# Patient Record
Sex: Female | Born: 1975 | Hispanic: No | Smoking: Never smoker
Health system: Southern US, Community
[De-identification: ages and names within clinical notes are randomized; demographics above are authoritative.]

## PROBLEM LIST (undated history)

## (undated) DIAGNOSIS — F209 Schizophrenia, unspecified: Secondary | ICD-10-CM

## (undated) DIAGNOSIS — Z923 Personal history of irradiation: Secondary | ICD-10-CM

## (undated) DIAGNOSIS — L910 Hypertrophic scar: Secondary | ICD-10-CM

## (undated) DIAGNOSIS — F22 Delusional disorders: Secondary | ICD-10-CM

## (undated) HISTORY — DX: Personal history of irradiation: Z92.3

## (undated) HISTORY — PX: TUBAL LIGATION: SHX77

---
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
## 2000-02-22 ENCOUNTER — Emergency Department (HOSPITAL_COMMUNITY): Admission: EM | Admit: 2000-02-22 | Discharge: 2000-02-22 | Payer: Self-pay | Admitting: Emergency Medicine

## 2000-07-21 ENCOUNTER — Inpatient Hospital Stay (HOSPITAL_COMMUNITY): Admission: AD | Admit: 2000-07-21 | Discharge: 2000-07-21 | Payer: Self-pay | Admitting: Gynecology

## 2000-09-08 ENCOUNTER — Inpatient Hospital Stay (HOSPITAL_COMMUNITY): Admission: AD | Admit: 2000-09-08 | Discharge: 2000-09-11 | Payer: Self-pay | Admitting: *Deleted

## 2005-02-07 ENCOUNTER — Inpatient Hospital Stay (HOSPITAL_COMMUNITY): Admission: AD | Admit: 2005-02-07 | Discharge: 2005-02-07 | Payer: Self-pay | Admitting: Obstetrics and Gynecology

## 2005-02-08 ENCOUNTER — Inpatient Hospital Stay (HOSPITAL_COMMUNITY): Admission: AD | Admit: 2005-02-08 | Discharge: 2005-02-09 | Payer: Self-pay | Admitting: Obstetrics and Gynecology

## 2005-03-04 ENCOUNTER — Other Ambulatory Visit: Admission: RE | Admit: 2005-03-04 | Discharge: 2005-03-04 | Payer: Self-pay | Admitting: Obstetrics and Gynecology

## 2005-04-15 ENCOUNTER — Inpatient Hospital Stay (HOSPITAL_COMMUNITY): Admission: AD | Admit: 2005-04-15 | Discharge: 2005-04-15 | Payer: Self-pay | Admitting: Obstetrics and Gynecology

## 2005-06-23 ENCOUNTER — Inpatient Hospital Stay (HOSPITAL_COMMUNITY): Admission: AD | Admit: 2005-06-23 | Discharge: 2005-06-23 | Payer: Self-pay | Admitting: Obstetrics and Gynecology

## 2005-06-25 ENCOUNTER — Inpatient Hospital Stay (HOSPITAL_COMMUNITY): Admission: AD | Admit: 2005-06-25 | Discharge: 2005-06-25 | Payer: Self-pay | Admitting: Obstetrics and Gynecology

## 2005-06-26 ENCOUNTER — Inpatient Hospital Stay (HOSPITAL_COMMUNITY): Admission: AD | Admit: 2005-06-26 | Discharge: 2005-06-26 | Payer: Self-pay | Admitting: Obstetrics and Gynecology

## 2005-06-29 ENCOUNTER — Inpatient Hospital Stay (HOSPITAL_COMMUNITY): Admission: AD | Admit: 2005-06-29 | Discharge: 2005-06-29 | Payer: Self-pay | Admitting: Obstetrics & Gynecology

## 2005-07-12 ENCOUNTER — Ambulatory Visit: Payer: Self-pay | Admitting: *Deleted

## 2005-07-19 ENCOUNTER — Ambulatory Visit: Payer: Self-pay | Admitting: *Deleted

## 2005-07-19 ENCOUNTER — Inpatient Hospital Stay (HOSPITAL_COMMUNITY): Admission: AD | Admit: 2005-07-19 | Discharge: 2005-07-19 | Payer: Self-pay | Admitting: Obstetrics and Gynecology

## 2005-07-20 ENCOUNTER — Inpatient Hospital Stay (HOSPITAL_COMMUNITY): Admission: AD | Admit: 2005-07-20 | Discharge: 2005-07-20 | Payer: Self-pay | Admitting: Obstetrics and Gynecology

## 2005-07-25 ENCOUNTER — Encounter (INDEPENDENT_AMBULATORY_CARE_PROVIDER_SITE_OTHER): Payer: Self-pay | Admitting: *Deleted

## 2005-07-25 ENCOUNTER — Inpatient Hospital Stay (HOSPITAL_COMMUNITY): Admission: AD | Admit: 2005-07-25 | Discharge: 2005-07-28 | Payer: Self-pay | Admitting: Obstetrics & Gynecology

## 2007-09-27 ENCOUNTER — Encounter (INDEPENDENT_AMBULATORY_CARE_PROVIDER_SITE_OTHER): Payer: Self-pay | Admitting: Internal Medicine

## 2007-09-27 ENCOUNTER — Ambulatory Visit: Payer: Self-pay | Admitting: Family Medicine

## 2007-09-27 DIAGNOSIS — R030 Elevated blood-pressure reading, without diagnosis of hypertension: Secondary | ICD-10-CM | POA: Insufficient documentation

## 2007-09-27 DIAGNOSIS — Z8742 Personal history of other diseases of the female genital tract: Secondary | ICD-10-CM | POA: Insufficient documentation

## 2007-09-27 DIAGNOSIS — O152 Eclampsia in the puerperium: Secondary | ICD-10-CM | POA: Insufficient documentation

## 2007-09-27 DIAGNOSIS — E669 Obesity, unspecified: Secondary | ICD-10-CM | POA: Insufficient documentation

## 2007-10-01 ENCOUNTER — Encounter (INDEPENDENT_AMBULATORY_CARE_PROVIDER_SITE_OTHER): Payer: Self-pay | Admitting: Internal Medicine

## 2010-07-18 ENCOUNTER — Emergency Department (HOSPITAL_COMMUNITY)
Admission: EM | Admit: 2010-07-18 | Discharge: 2010-07-20 | Disposition: A | Discharge status: Other Acute Inpatient Hospital | Payer: Self-pay | Source: Home / Self Care | Admitting: Emergency Medicine

## 2010-07-19 ENCOUNTER — Ambulatory Visit: Payer: Self-pay | Admitting: Psychiatry

## 2010-07-20 ENCOUNTER — Inpatient Hospital Stay (HOSPITAL_COMMUNITY): Admission: AD | Admit: 2010-07-20 | Discharge: 2010-07-29 | Payer: Self-pay | Admitting: Psychiatry

## 2010-11-09 LAB — DIFFERENTIAL
Basophils Absolute: 0 10*3/uL (ref 0.0–0.1)
Basophils Relative: 0 % (ref 0–1)
Eosinophils Absolute: 0 10*3/uL (ref 0.0–0.7)
Eosinophils Relative: 0 % (ref 0–5)
Lymphocytes Relative: 12 % (ref 12–46)
Lymphs Abs: 0.9 10*3/uL (ref 0.7–4.0)
Monocytes Absolute: 0.2 10*3/uL (ref 0.1–1.0)
Monocytes Relative: 2 % — ABNORMAL LOW (ref 3–12)
Neutro Abs: 6.5 10*3/uL (ref 1.7–7.7)
Neutrophils Relative %: 86 % — ABNORMAL HIGH (ref 43–77)

## 2010-11-09 LAB — CBC
HCT: 34.4 % — ABNORMAL LOW (ref 36.0–46.0)
Hemoglobin: 11.2 g/dL — ABNORMAL LOW (ref 12.0–15.0)
MCH: 26.3 pg (ref 26.0–34.0)
MCHC: 32.6 g/dL (ref 30.0–36.0)
MCV: 80.6 fL (ref 78.0–100.0)
Platelets: 424 10*3/uL — ABNORMAL HIGH (ref 150–400)
RBC: 4.28 MIL/uL (ref 3.87–5.11)
RDW: 16.8 % — ABNORMAL HIGH (ref 11.5–15.5)
WBC: 7.5 10*3/uL (ref 4.0–10.5)

## 2010-11-09 LAB — RAPID URINE DRUG SCREEN, HOSP PERFORMED
Amphetamines: NOT DETECTED
Barbiturates: NOT DETECTED
Benzodiazepines: NOT DETECTED
Cocaine: NOT DETECTED
Opiates: NOT DETECTED
Tetrahydrocannabinol: NOT DETECTED

## 2010-11-09 LAB — COMPREHENSIVE METABOLIC PANEL
ALT: 12 U/L (ref 0–35)
AST: 18 U/L (ref 0–37)
Albumin: 4.5 g/dL (ref 3.5–5.2)
Alkaline Phosphatase: 77 U/L (ref 39–117)
BUN: 13 mg/dL (ref 6–23)
CO2: 23 mEq/L (ref 19–32)
Calcium: 9.6 mg/dL (ref 8.4–10.5)
Chloride: 103 mEq/L (ref 96–112)
Creatinine, Ser: 1.25 mg/dL — ABNORMAL HIGH (ref 0.4–1.2)
GFR calc Af Amer: 59 mL/min — ABNORMAL LOW (ref >60)
GFR calc non Af Amer: 49 mL/min — ABNORMAL LOW (ref >60)
Glucose, Bld: 92 mg/dL (ref 70–99)
Potassium: 3.6 mEq/L (ref 3.5–5.1)
Sodium: 138 mEq/L (ref 135–145)
Total Bilirubin: 0.6 mg/dL (ref 0.3–1.2)
Total Protein: 9 g/dL — ABNORMAL HIGH (ref 6.0–8.3)

## 2010-11-09 LAB — POCT PREGNANCY, URINE: Preg Test, Ur: NEGATIVE

## 2010-11-09 LAB — ETHANOL: Alcohol, Ethyl (B): <5 mg/dL (ref 0–10)

## 2011-01-14 NOTE — H&P (Signed)
[facility]  Patient:    Valerie Howard, Valerie Howard                       MRN: [id number] Adm. Date:  [id number] Disc. Date: [id number] Attending:  Merrily Pew                         History and Physical  CHIEF COMPLAINT:              A 37-week intrauterine pregnancy with previous cesarean section.  HISTORY OF PRESENT ILLNESS:   The patient is a 35 year old G 2, P 1, at [redacted] weeks gestation, who had a pervious cesarean section in 1997, who was initially desiring a trial of labor, but has decided to undergo a repeat cesarean section.  The patient had several complications during her pregnancy, including increased risk for Downs syndrome, which an amniocentesis was performed and noted to be a 46XX karyotype.  The patient has had no other complaints of problems since then.  The patient reports good fetal movement. Denies any vaginal bleeding,  headache, nausea, vomiting, chills, abdominal pain, or change in bowel or bladder habits.  PAST MEDICAL HISTORY:         Significant for asthma.  PAST SURGICAL HISTORY:        1. Cesarean section in 1997.                               2. Cryo therapy in 1991.  PAST OBSTETRICAL HISTORY:     A cesarean section in 1997, for a breech presentation.  No complications during the pregnancy. PAST GYN HISTORY:             1. History of dysplasia in 1991, with cryo                                  surgery.                               2. History of HSV.  CURRENT MEDICATIONS:          Prenatal vitamins.  ALLERGIES:                    No known drug allergies.  SOCIAL HISTORY:               The patient is married.  Denies any tobacco, alcohol, or drugs.  FAMILY HISTORY:               Without any epithelial cancers.  PHYSICAL EXAMINATION:  VITAL SIGNS:                  Blood pressure 122/70.  HEENT:                        Clear.  NECK:                         Without any thyromegaly or JVD.  LUNGS:                         Clear to auscultation bilaterally.  HEART:  Regular rate and rhythm without any murmurs, rubs, or gallops.  ABDOMEN:                      Gravid, nontender.  Estimated fetal weight is 3300 g.  PELVIC:                       Cervix closed, thick, and  high.  ASSESSMENT/PLAN:              A 35 year old gravida 2, para 1, at 14 weeks,                               desiring repeat cesarean section, refusing                               vaginal birth after cesarean section.  The patient was counseled on the risks of the procedure, including bleeding, transfusion, infection, scar tissue, wound breakdown, damage to internal organs including bladder, ureter, pelvic organs, or bowel, fistula formation, potential hysterectomy for bleeding, anesthetic complications, blood clot, stroke, pulmonary embolus, and possibly death.  The patient states she understands these risks and desires to proceed. DD:  09/07/00 TD:  09/07/00 Job: [id number]

## 2011-01-14 NOTE — Op Note (Signed)
NAMESIDNIE, SWALLEY      ACCOUNT NO.:  [id number]   MEDICAL RECORD NO.:  [id number]          PATIENT TYPE:  INP   LOCATION:  9199                          FACILITY:  [facility]   PHYSICIAN:  Dineen Kid. Rana Snare, M.D.    DATE OF BIRTH:  January 29, 1976   DATE OF PROCEDURE:  07/25/2005  DATE OF DISCHARGE:                                 OPERATIVE REPORT   PREOPERATIVE DIAGNOSIS:  1.  Intrauterine pregnancy at 32-5/7 weeks.  2.  Twin pregnancy.  3.  Labor with spontaneous rupture.  4.  Multiparity and desires sterility.   POSTOPERATIVE DIAGNOSES:  1.  Intrauterine pregnancy at 32-5/7 weeks.  2.  Twin pregnancy.  3.  Labor with spontaneous rupture.  4.  Multiparity and desires sterility.  5.  Cystotomy.   PROCEDURES:  1.  Repeat low segment transverse cesarean section.  2.  Parkland bilateral tubal ligation.  3.  Repair of cystotomy.   SURGEON:  Dineen Kid. Rana Snare, M.D.   ANESTHESIA:  Spinal.   INDICATIONS:  Ms. Blake is a 35 year old G3, P2, who presented in  labor with spontaneous rupture of membranes.  Pregnancy has been complicated  by twins with concordant growth and preterm labor.  The patient stopped her  terbutaline two days ago on her own and woke up this morning in labor with  rupture of membranes.  Her EDC is September 13, 2005.  She has a previous  cesarean section x2, desires repeat.  She also desires tubal ligation.  Risks and benefits were discussed at length.  These included but are not  limited to risk of infection, bleeding, damage to uterus, tubes, and  ovaries, bowel, bladder, risk of tubal failure quoted at 5 out of 1000.  She  gives her informed consent and wished to proceed.   FINDINGS AT THE TIME OF SURGERY:  A viable female infant, Apgars 8 and 9,  weight 4 pounds 10 ounces.  A viable female infant, Apgars 8 and 10, weight  4 pounds 1 ounce.   DESCRIPTION OF PROCEDURE:  After adequate analgesia, the patient placed in  the supine position with left  lateral tilt.  She was sterilely prepped and  draped, the bladder was sterilely drained with a Foley catheter.  A  Pfannenstiel skin incision was made, was taken down sharply to the fascia,  which was incised transversely, extended superiorly and inferiorly off the  bellies of the rectus muscle.  Peritoneum, omentum and bladder were densely  adhered to the fascia and the uterus.  Attempts at careful dissection from  the fascial wall and from the uterine wall of the adhesions, removing  bladder from the midportion of the uterus and creating a small bladder flap,  a 3 cm cystotomy was performed in the process of dissecting the bladder from  the uterine wall and the fascia.  Good hemostasis was achieved.  At this  time the lower uterine segment was identified, myotomy incision was made.  The infant's vertex was delivered, the nares and pharynx suctioned, cord  clamped and cut, and handed to the pediatrician for resuscitation.  Cord  blood was obtained.  Amniotomy was performed on baby  B.  The baby's buttocks  was grasped, delivered in the frank breech position, the arms easily  reduced, head delivered, and the cord then clamped and cut, and handed to  the pediatrician for resuscitation.  Cord blood of baby B was obtained.  The  placentas were extracted manually.  An umbilical clamp was placed on the  cord of A.  The uterus was exteriorized, wiped clean with a dry lap.  The  myotomy incision was closed in two layers, the first being a running locking  layer, the second being an imbricating layer of 0 Monocryl suture.  The left  fallopian tube was identified at the fimbriated end, the midportion of the  tube grasped with a Babcock clamp and two sutures of 0 plain were placed at  the mesosalpinx, doubly ligated, the central portion excised, tubal ostia  made hemostatic with Bovie cautery, and sutures of 2-0 silk were placed on  the proximal and distal portion of the fallopian tube.  The left  fallopian  tube was identified by the fimbriated end, the midportion of the tube  grasped.  Two sutures of 0 Monocryl were placed at the mesosalpinx, doubly  ligated, central portion excised, tubal ostia made hemostatic with Bovie  cautery, and 2-0 silk was placed on the proximal and distal portion of the  fallopian tube with good hemostasis achieved.  The uterus placed back in the  peritoneal cavity.  After a copious amount of irrigation, adequate  hemostasis was assured.  Attention was turned toward the cystotomy.  The  mucosa was then closed with a running suture of 3-0 chromic.  It was  imbricated with a second layer of 3-0 chromic with good repair noted, good  hemostasis achieved.  The bladder was then instilled with sterile milk  approximately 400-500 mL.  No evidence of leaking noted.  The rectus muscles  were then plicated in the midline with 0 Monocryl suture.  The fascia was  closed with a running fascial stitch of #1 PDS.  Irrigation was applied and  after adequate hemostasis, the skin was stapled and Steri-Strips applied.  The patient tolerated the procedure well, was stable on transfer to the  recovery room.  The sponge and instrument count was normal x3.  Estimated  blood loss 700 mL.  The patient received 1 g of Rocephin after delivery of  the placenta.      Dineen Kid Rana Snare, M.D.  Electronically Signed     DCL/MEDQ  D:  07/25/2005  T:  07/25/2005  Job:  [id number]

## 2011-01-14 NOTE — Discharge Summary (Signed)
[facility]  Patient:    Valerie Howard, Valerie Howard             MRN: [id number] Adm. Date:  [id number] Disc. Date: [id number] Attending:  Wetzel Bjornstad Dictator:   Antony Contras, [facility]                           Discharge Summary  DISCHARGE DIAGNOSES:          1. Repeat low cervical transverse cesarean                                  section.                               2. History of herpes simplex virus.                               3. History of asthma.  PROCEDURES:                   Low cervical transverse cesarean section                               with delivery of viable infant.  HISTORY OF PRESENT ILLNESS:   The patient is a 35 year old, gravida 2, para 1-0-0-1 with an LMP of February 24, 2000, [facility] September 10, 2000. Prenatal risk factors include history of previous cesarean section secondary to breech. History of HSV. History of asthma. Past history of cervical dysplasia treated with cryosurgery. She also has a history of abnormal AFP showing an increased risk for Downs syndrome with a normal followup amniocentesis.  LABORATORY DATA:              Blood type O positive, antibody screen negative. Sickle cell negative. RPR, HBSAG, and HIV nonreactive. Rubella immune. GBS negative.  HOSPITAL COURSE AND TREATMENT:                The patient was admitted on September 08, 2000 for repeat low cervical transverse cesarean section after being counseled regarding VBAC and refusing the same. This procedure was performed by Dr. Penni Homans assisted by Dr. Farrel Gobble under spinal anesthesia. The patient was delivered of an Apgar 8/9 female infant weighing 6 pounds 12 ounces. Findings included omental adhesions to the bladder and also a 4 x 4-cm fundal fibroid.  Postoperatively, the patient remained afebrile. She has experienced some gas pains. CBC: Hematocrit 33.2, hemoglobin 11.2, WBCs 10.0, platelets 306,000. She was able to be discharged in satisfactory  condition with infant on her third postoperative day.  DISPOSITION:                  The patient is to follow up in six weeks. She is to continue prenatal vitamins and iron, Motrin and Percocet for pain. DD:  10/09/00 TD:  10/09/00 Job: [id number]

## 2011-01-14 NOTE — Op Note (Signed)
[facility]  Patient:    RUBEN, PYKA             MRN: [id number] Proc. Date: 09/08/00 Attending:  Tonye Royalty                           Operative Report  PREOPERATIVE DIAGNOSES:       1. A 39-week intrauterine pregnancy.                               2. Prior low transverse cesarean section.                               3. Refusal of vaginal birth after cesarean.  POSTOPERATIVE DIAGNOSES:      1. A 39-week intrauterine pregnancy.                               2. Prior low transverse cesarean section.                               3. Refusal of vaginal birth after cesarean.  PROCEDURE:                    Repeat low transverse cesarean section.  SURGEON:                      Katy Fitch, M.D.  ASSISTANT:                    Douglass Rivers, M.D.  ANESTHESIA:                   Spinal.  ESTIMATED BLOOD LOSS:         800 cc.  URINE OUTPUT:                 150 cc and clear.  FLUIDS:                       1500 cc crystalloid.  COMPLICATIONS:                None.  SPECIMENS:                    Cord blood.  FINDINGS:                     Living 6 pound 12 ounce female with Apgars of 8 and 9; with three-vessel cord and placenta intact.  Normal appearing tubes and ovaries, with a posterior fundal fibroid approximately 4 x 4 cm.  There is also omental adhesions to the bladder.  DESCRIPTION OF PROCEDURE:     After consent and procedure explained to the patient, the patient was taken to the operating room and given spinal anesthesia.  The patient was then placed in the dorsolithotomy position and prepped and draped in normal sterile fashion.  A Pfannenstiel skin incision was made through the prior scar and carried down to the underlying fascia. The fascia was incised with the scalpel, and then extended transversely using curved Mayo scissors.  The underlying fascia was grasped with Kocher clamps, and the underlying rectus  muscles are dissected off both  sharply and bluntly using curved Mayo scissors.  Entrance into the abdominal and peritoneal cavity was noted.  Extension of the peritoneal incision was made with curved Mayo scissors superiorly, and then with visualization of the bladder extended inferiorly.  However, there was noted to be omental adhesions to the superior edge of the bladder flap; and two Kelly clamps were crossclamped and a 3-0 plain suture was used to ligate the omental tissue.  Once the omentum was freed, the remainder of the bladder flap was taken down to the pubic bone. The bladder blade was then inserted into the pelvis, and bladder flap was created sharply using Metzenbaum scissors.  The bladder blade was inserted into the newly created bladder flap and a low transverse uterine incision was made with the scalpel.  The incision was extended transversely using bandage scissors.  The infants head was then delivered atraumatically.  The mouth and nose were suctioned.  The cord was clamped and cut and the infant handed off to the awaiting attendants.  Cord blood was then obtained and placenta was massaged from the uterus.  The uterus was then exteriorized and cleared of all clots and debris with moist tape.  A running 1-0 chromic interlocking stitch was then used to close the lower uterine segment.  The uterus was then returned to the pelvis and warm saline was used to irrigate the pelvis. Several areas of bleeding were noted on the bladder flap and uterine serosa were cauterized using a Bovie.  Irrigation was then again performed and hemostasis was noted.  The subfascial area was then inspected for hemostasis, which was noted.  The fascia was then reapproximated using 0 Vicryl in a running continuous stitch.  The subcutaneous tissue was then irrigated with warm normal saline.  Bovie was used to cauterize subcutaneous vessels.  The skin was then approximated using staples.  The patient was  taken to the recovery room in stable condition. DD:  09/08/00 TD:  09/09/00 Job: [id number]

## 2011-01-14 NOTE — Discharge Summary (Signed)
Valerie Howard, Valerie Howard      ACCOUNT NO.:  [id number]   MEDICAL RECORD NO.:  [id number]          PATIENT TYPE:  INP   LOCATION:  9304                          FACILITY:  [facility]   PHYSICIAN:  Michelle L. Grewal, M.D.DATE OF BIRTH:  1976/01/31   DATE OF ADMISSION:  07/25/2005  DATE OF DISCHARGE:  07/28/2005                                 DISCHARGE SUMMARY   PREOPERATIVE DIAGNOSES:  1.  Intrauterine pregnancy at 32-5/7 weeks' estimated gestational age.  2.  Twin gestation.  3.  Spontaneous onset of labor with preterm rupture of membranes.  4.  Multiparity, desires sterility.   DISCHARGE DIAGNOSES:  1.  Status post low transverse cesarean section.  2.  A viable female and female infant.  3.  Parkland bilateral tubal ligation.  4.  Repair cystotomy.   PROCEDURES:  1.  Repeat low transverse cesarean section.  2.  Parkland bilateral tubal ligation.  3.  Repair of cystotomy.   REASON FOR ADMISSION:  Please see dictated H&P.   HOSPITAL COURSE:  The patient was a 35 year old gravida 3, para 2, that  presented to [facility] in labor with spontaneous rupture  of membranes.  Pregnancy had been complicated by twins with concordant  growth and preterm labor.  On the day of admission the patient had stopped  her terbutaline two days prior to admission and had awoken on the morning of  admission and was in labor with spontaneous rupture of membranes.  The  patient had had a previous cesarean section x2 and desired a repeat.  Due to  multiparity, the patient has also requested permanent sterilization.  The  patient was then transferred to the operating room, where spinal anesthesia  was administered without difficulty.  A low transverse incision was made  with delivery of a viable female infant weighing 4 pounds 10 ounces, Apgars of  8 at one and 9 at five minutes and delivery of a viable female infant  weighing 4 pounds, 1.7 ounces, with Apgars of 8a t one minute and 10 at  five  minutes.  During the time of the cesarean delivery, careful dissection was  being made; however, there was a significant amount of adhesions and a  cystotomy was performed in the process of dissecting the bladder from the  uterine wall and the fascia.  Hemostasis was a was obtained and repair was  made. The patient tolerated the procedure well and was taken to the recovery  room in stable condition.  On postoperative day #1 the patient was without  complaint, babies were stable in the NICU, vital signs were stable with  temperature max of 100.5 and 98.7 at the time of rounding.  Abdomen slightly  distended; however, she had good return of bowel function.  Fundus was firm  and nontender.  Abdominal dressing was said to be clean, dry and intact.  Foley was draining with clear urine with adequate output.  Laboratory  findings revealed hemoglobin of 8.9, platelet count of 279,000, WBC count of  10.2,  On postoperative day #2 the patient was doing well.  The twins were  stable.  Vital signs stable without evidence of  infection.  Abdomen soft,  good return of bowel function.  Incision was clean, dry and intact.  Foley  was draining clear urine.  On postoperative day #3 the patient complained of  some pedal edema.  She denied headache, blurred vision or right upper  quadrant pain.  Vital signs stable with blood pressure 146/96 to 159/105.  Abdomen soft.  Pitting edema was noted to the knees 1+.  Deep tendon  reflexes 1+ without clonus.  PIH labs were ordered and hydrochlorothiazide  25 mg was started.  On postoperative day #3 the patient denied headache,  blurred vision or right upper quadrant pain.  She was ambulating well,  babies were stable in the NICU, vital signs were stable.  Blood pressure  140/90 to 157/99.  Deep tendon reflexes remained stable at 1+ without  clonus.  Abdomen was slightly distended.  Fundus firm and nontender.  Incision was clean, dry and intact.  Hemoglobin was  8.8, platelet count of  319,000, WBC count of 9.3.  Liver function tests were within normal limited.  Uric acid was 8.2.  The patient was given a suppository with good results  and later the patient did desire discharge.  Instructions were given and the  patient was discharged home.   CONDITION ON DISCHARGE:  Stable.   DIET:  Regular as tolerated.   To do no heavy lifting, no driving x2 weeks, no vaginal entry.   FOLLOW-UP:  Patient to follow up in the office in one week for catheter  removal and incision check.  She is to call for temperature greater than 100  degrees, persistent nausea or vomiting, heavy vaginal bleeding and/or  redness or drainage from the incisional site.   DISCHARGE MEDICATIONS:  1.  Percocet 5/325 mg x30 one p.o. every four to six hours p.r.n.  2.  Motrin 600 mg every six hours.  3.  Prenatal vitamins one p.o. daily.  4.  Colace one p.o. daily.      Julio Sicks, N.P.      Stann Mainland. Vincente Poli, M.D.  Electronically Signed    CC/MEDQ  D:  09/02/2005  T:  09/02/2005  Job:  [id number]

## 2011-12-05 ENCOUNTER — Emergency Department (HOSPITAL_COMMUNITY)
Admission: EM | Admit: 2011-12-05 | Discharge: 2011-12-06 | Disposition: A | Discharge status: Other Acute Inpatient Hospital | Payer: BC Managed Care – PPO | Attending: Emergency Medicine | Admitting: Emergency Medicine

## 2011-12-05 ENCOUNTER — Encounter (HOSPITAL_COMMUNITY): Payer: Self-pay | Admitting: Emergency Medicine

## 2011-12-05 DIAGNOSIS — F39 Unspecified mood [affective] disorder: Secondary | ICD-10-CM

## 2011-12-05 DIAGNOSIS — F22 Delusional disorders: Secondary | ICD-10-CM | POA: Insufficient documentation

## 2011-12-05 NOTE — ED Notes (Signed)
Pt is agitated but cooperative in words and actions
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
## 2011-12-06 ENCOUNTER — Inpatient Hospital Stay (HOSPITAL_COMMUNITY)
Admission: AD | Admit: 2011-12-06 | Discharge: 2011-12-20 | DRG: 430 | Disposition: A | Discharge status: Home / Self Care | Payer: BC Managed Care – PPO | Source: Ambulatory Visit | Attending: Psychiatry | Admitting: Psychiatry

## 2011-12-06 DIAGNOSIS — F200 Paranoid schizophrenia: Principal | ICD-10-CM | POA: Diagnosis present

## 2011-12-06 DIAGNOSIS — F22 Delusional disorders: Secondary | ICD-10-CM

## 2011-12-06 DIAGNOSIS — D649 Anemia, unspecified: Secondary | ICD-10-CM | POA: Diagnosis present

## 2011-12-06 DIAGNOSIS — F39 Unspecified mood [affective] disorder: Secondary | ICD-10-CM

## 2011-12-06 LAB — BASIC METABOLIC PANEL
BUN: 15 mg/dL (ref 6–23)
CO2: 26 mEq/L (ref 19–32)
Chloride: 103 mEq/L (ref 96–112)
GFR calc Af Amer: 84 mL/min — ABNORMAL LOW (ref >90)
GFR calc non Af Amer: 73 mL/min — ABNORMAL LOW (ref >90)
Glucose, Bld: 117 mg/dL — ABNORMAL HIGH (ref 70–99)
Potassium: 3.4 mEq/L — ABNORMAL LOW (ref 3.5–5.1)

## 2011-12-06 LAB — CBC
HCT: 30.4 % — ABNORMAL LOW (ref 36.0–46.0)
Hemoglobin: 8.9 g/dL — ABNORMAL LOW (ref 12.0–15.0)
MCH: 22 pg — ABNORMAL LOW (ref 26.0–34.0)
MCHC: 29.3 g/dL — ABNORMAL LOW (ref 30.0–36.0)
RBC: 4.05 MIL/uL (ref 3.87–5.11)
RDW: 17.4 % — ABNORMAL HIGH (ref 11.5–15.5)
WBC: 4.5 10*3/uL (ref 4.0–10.5)

## 2011-12-06 LAB — RAPID URINE DRUG SCREEN, HOSP PERFORMED
Amphetamines: NOT DETECTED
Benzodiazepines: NOT DETECTED
Opiates: NOT DETECTED
Tetrahydrocannabinol: NOT DETECTED

## 2011-12-06 LAB — URINALYSIS, ROUTINE W REFLEX MICROSCOPIC
Bilirubin Urine: NEGATIVE
Glucose, UA: NEGATIVE mg/dL
Leukocytes, UA: NEGATIVE
Protein, ur: NEGATIVE mg/dL
Specific Gravity, Urine: 1.021 (ref 1.005–1.030)
Urobilinogen, UA: 0.2 mg/dL (ref 0.0–1.0)
pH: 6 (ref 5.0–8.0)

## 2011-12-06 LAB — PREGNANCY, URINE: Preg Test, Ur: NEGATIVE

## 2011-12-06 LAB — URINE MICROSCOPIC-ADD ON

## 2011-12-06 LAB — ETHANOL: Alcohol, Ethyl (B): <11 mg/dL (ref 0–11)

## 2011-12-06 MED ORDER — ONDANSETRON HCL 4 MG PO TABS: 4.0000 mg | ORAL_TABLET | Freq: Three times a day (TID) | ORAL | Status: DC | PRN

## 2011-12-06 MED ORDER — IBUPROFEN 600 MG PO TABS
600.0000 mg | ORAL_TABLET | Freq: Three times a day (TID) | ORAL | Status: DC | PRN
Start: 2011-12-06 — End: 2011-12-20

## 2011-12-06 MED ORDER — NICOTINE 21 MG/24HR TD PT24
21.0000 mg | MEDICATED_PATCH | Freq: Every day | TRANSDERMAL | Status: DC
  Filled 2011-12-06 (×6): qty 1

## 2011-12-06 MED ORDER — HYDROXYZINE HCL 25 MG PO TABS: 25.0000 mg | ORAL_TABLET | Freq: Every evening | ORAL | Status: DC | PRN

## 2011-12-06 MED ORDER — NICOTINE 21 MG/24HR TD PT24: 21.0000 mg | MEDICATED_PATCH | Freq: Every day | TRANSDERMAL | Status: DC

## 2011-12-06 MED ORDER — ZOLPIDEM TARTRATE 5 MG PO TABS: 5.0000 mg | ORAL_TABLET | Freq: Every evening | ORAL | Status: DC | PRN

## 2011-12-06 MED ORDER — RISPERIDONE 1 MG PO TBDP
1.0000 mg | ORAL_TABLET | Freq: Every day | ORAL | Status: DC
  Filled 2011-12-06 (×3): qty 1

## 2011-12-06 MED ORDER — RISPERIDONE 1 MG PO TBDP: 1.0000 mg | ORAL_TABLET | Freq: Four times a day (QID) | ORAL | Status: DC | PRN

## 2011-12-06 MED ORDER — ALUM & MAG HYDROXIDE-SIMETH 200-200-20 MG/5ML PO SUSP: 30.0000 mL | ORAL | Status: DC | PRN

## 2011-12-06 MED ORDER — IBUPROFEN 600 MG PO TABS: 600.0000 mg | ORAL_TABLET | Freq: Three times a day (TID) | ORAL | Status: DC | PRN

## 2011-12-06 MED ORDER — MAGNESIUM HYDROXIDE 400 MG/5ML PO SUSP: 30.0000 mL | Freq: Every day | ORAL | Status: DC | PRN

## 2011-12-06 MED ORDER — LORAZEPAM 1 MG PO TABS: 1.0000 mg | ORAL_TABLET | Freq: Three times a day (TID) | ORAL | Status: DC | PRN

## 2011-12-06 MED ORDER — ACETAMINOPHEN 325 MG PO TABS: 650.0000 mg | ORAL_TABLET | Freq: Four times a day (QID) | ORAL | Status: DC | PRN

## 2011-12-06 NOTE — Progress Notes (Signed)
Pt has been in bed to this point. Writer approached pt to administer HS meds and attempt to ask Admission assessment questions. Pt continues to feel like she doesn't need to be here and doesn't feel like she needs any medications. Refused HS meds and refused to answer assessment questions. Kept replying "I dont need any medication" and "I dont need to be here!". Attempts by writer to encourage compliance with medications were ineffective. Safety has been maintained with Q15 minute observation. Will continue current POC and encourage compliance when possible.

## 2011-12-06 NOTE — ED Notes (Signed)
Pt searched and wanded by security. Pt belongings searched by security. Pt's 2 belongings bags at nurse's station.
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
## 2011-12-06 NOTE — ED Notes (Signed)
Pt informed of hospital policy regarding IVC pts. Pt verbalized understanding. Pt changed into blue paper scrubs. Pt belongings at nurse's station.
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
## 2011-12-06 NOTE — ED Notes (Signed)
Patient refused lunch tray.
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
## 2011-12-06 NOTE — ED Provider Notes (Signed)
Patient has been accepted to behavioral health.  Cyndra Numbers, MD 12/06/11 [phone]

## 2011-12-06 NOTE — BH Assessment (Signed)
Patient accepted  by Dr. Wallis Mart to Dr. Wallis Mart to [facility] for in-pt treatment.   Pt's bed assignment is  403-1. Writer called Dr. Alto Denver and made her aware of patients disposition for in-pt treatment at [facility]. She will discharge patient accordingly.

## 2011-12-06 NOTE — ED Provider Notes (Signed)
Medical screening examination/treatment/procedure(s) were performed by non-physician practitioner and as supervising physician I was immediately available for consultation/collaboration.  Olivia Mackie, MD 12/06/11 [phone]

## 2011-12-06 NOTE — Progress Notes (Signed)
Invited pt to behavioral health group in psych ED; pt declined.  Tedrick Port B MS, LPCA, NCC

## 2011-12-07 DIAGNOSIS — F200 Paranoid schizophrenia: Secondary | ICD-10-CM | POA: Diagnosis present

## 2011-12-07 MED ORDER — RISPERIDONE 2 MG PO TBDP
3.0000 mg | ORAL_TABLET | Freq: Every day | ORAL | Status: DC
  Administered 2011-12-08 – 2011-12-11 (×4): 3 mg via ORAL
  Filled 2011-12-07 (×7): qty 1

## 2011-12-07 MED ORDER — FERROUS SULFATE 325 (65 FE) MG PO TABS
325.0000 mg | ORAL_TABLET | Freq: Two times a day (BID) | ORAL | Status: DC
  Administered 2011-12-08 – 2011-12-15 (×9): 325 mg via ORAL
  Filled 2011-12-07 (×31): qty 1

## 2011-12-07 MED ORDER — POTASSIUM CHLORIDE CRYS ER 20 MEQ PO TBCR
20.0000 meq | EXTENDED_RELEASE_TABLET | Freq: Two times a day (BID) | ORAL | Status: AC
  Administered 2011-12-08 – 2011-12-09 (×4): 20 meq via ORAL
  Filled 2011-12-07 (×6): qty 1

## 2011-12-07 NOTE — Progress Notes (Signed)
Currently resting quietly in bed in right facing, prone position with eyes closed. Respirations are even and unlabored. No acute distress noted. Safety has been maintained with Q15 minute observation. Will continue current POC.
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
## 2011-12-07 NOTE — H&P (Signed)
Psychiatric Admission Assessment Adult  Patient Identification:  Valerie Howard  Date of Evaluation:  12/07/2011  Chief Complaint:  Mood Disorder NOS  History of Present Illness:: This is a 36 year old African-American female, admitted to [facility] from [facility] ED under IVC with complaints of paranoid ideations. Patient reports, "I am here because of family issues. I would not want you to ask me to elaborate what the issues are because I am not going to elaborate on any of them. I like to keep things the way they should be, within the family. There nothing any body can do to change that for me. I am not suppose to be here, but I am now, so let's go on with it. Yes, I slept well last night thank you, I was also sleeping well at home. Each and every one of you keep asking me questions like I don't know what is going on. I have a graduate degree, but I am not going to tell you what kind of degree it is. I went to [employer], but I am not too crazy about the school either"   Mood Symptoms:  'My mood is fine, thank you"  Depression Symptoms:  "I am not depressed"  (Hypo) Manic Symptoms:  Irritable Mood, per observation of patient attitude and mannerisms.  Anxiety Symptoms:  Patient appears suspicious and anxious when asked any questions per                                        observation  Psychotic Symptoms:  Paranoia, suspicious  PTSD Symptoms: Had a traumatic exposure:  Patient denies any hx. of and or recent traumatic experience.  Past Psychiatric History: Diagnosis: Paranoid disorder, NOS  Hospitalizations: [facility]  Outpatient Care: "I don not have any primary care physician"  Substance Abuse Care: Patient denies any recent and or remote substance abuse treatment.  Self-Mutilation: Denies report  Suicidal Attempts:Patient denies suicide attempts and thoughts.  Violent Behaviors: None reported    Past Medical History:  No past medical history on file. Cardiac History:   Hypertension.  Allergies:   Allergies  Allergen Reactions  . Penicillins    PTA Medications: No prescriptions prior to admission    Previous Psychotropic Medications:  Medication/Dose  "I do not take any medicines"               Substance Abuse History in the last 12 months: Substance Age of 1st Use Last Use Amount Specific Type  Nicotine "I do not smoke, drink and or use any kind of drugs"     Alcohol Denies use     Cannabis Denies use     Opiates Denies use     Cocaine Denies use     Methamphetamines Denies use     LSD Denies use     Ecstasy Denies use     Benzodiazepines Denies use     Caffeine      Inhalants      Others:                         Consequences of Substance Abuse: Medical Consequences:  Liver damage, possible death by overdose. Legal Consequences:  Arrests, jail time, loss of driving privilege Family Consequences:  Family discord  Social History: Current Place of Residence:  "I live in [place]"  Place of Birth: [place]  Family Members: "I have 4 children"  Marital Status:  Married  Children: 4  Sons: 1  Daughters:3  Relationships: "I have a family"  Education:  Financial planner Problems/Performance: "I completed graduate degree"  Religious Beliefs/Practices: None reported  History of Abuse (Emotional/Phsycial/Sexual): None reported  Occupational Experiences: Unemployed  Military History:  None.  Legal History: None reported  Hobbies/Interests: None reported  Family History:   Family History  Problem Relation Age of Onset  . Hypertension Other   . Diabetes Other   . Cancer Other     Mental Status Examination/Evaluation: Objective:  Appearance: Casual, Guarded and paranoid, suspicious  Eye Contact::  Good  Speech:  Pressured  Volume:  Increased  Mood:  Anxious and Irritable  Affect:  Blunt and Inappropriate  Thought Process:  Disorganized  Orientation:  Full  Thought Content:  Paranoid  Ideation  Suicidal Thoughts:  No  Homicidal Thoughts:  No  Memory:  Immediate;   Good Recent;   Good Remote;   Fair  Judgement:  Impaired  Insight:  Lacking  Psychomotor Activity:  Normal  Concentration:  Poor  Recall:  Good  Akathisia:  No  Handed:  Right  AIMS (if indicated):     Assets:  Others:  Patient is very disengaged and paranoid at this time.  Sleep:    "I sleep well, thank you"    Laboratory/X-Ray: None Psychological Evaluation(s)      Assessment:    AXIS I:  Schizophrenia, paranoid type AXIS II:  Deferred AXIS III:  No past medical history on file. AXIS IV:  other psychosocial or environmental problems and problems with access to health care services AXIS V:  21-30 behavior considerably influenced by delusions or hallucinations OR serious impairment in judgment, communication OR inability to function in almost all areas  Treatment Plan/Recommendations: Admit for safety and stabilization.                     Review and reinstate any pertinent home medications for other health conditions.                     Initiate Kdur 20 meq bid x 6 doses.                     Ferrous sulfate 325 mg bid.                     Obtain CMP 12/10/11, and CBC with diff on 12/14/11   Treatment Plan Summary: Daily contact with patient to assess and evaluate symptoms and progress in treatment Medication management   Current Medications:  Current Facility-Administered Medications  Medication Dose Route Frequency Provider Last Rate Last Dose  . acetaminophen (TYLENOL) tablet 650 mg  650 mg Oral Q6H PRN Viviann Spare, NP      . alum & mag hydroxide-simeth (MAALOX/MYLANTA) 200-200-20 MG/5ML suspension 30 mL  30 mL Oral Q4H PRN Viviann Spare, NP      . alum & mag hydroxide-simeth (MAALOX/MYLANTA) 200-200-20 MG/5ML suspension 30 mL  30 mL Oral PRN Viviann Spare, NP      . ferrous sulfate tablet 325 mg  325 mg Oral BID WC Sanjuana Kava, NP      . hydrOXYzine (ATARAX/VISTARIL)  tablet 25 mg  25 mg Oral QHS PRN Viviann Spare, NP      . ibuprofen (ADVIL,MOTRIN) tablet 600 mg  600 mg Oral Q8H PRN Viviann Spare,  NP      . LORazepam (ATIVAN) tablet 1 mg  1 mg Oral Q8H PRN Viviann Spare, NP      . magnesium hydroxide (MILK OF MAGNESIA) suspension 30 mL  30 mL Oral Daily PRN Viviann Spare, NP      . nicotine (NICODERM CQ - dosed in mg/24 hours) patch 21 mg  21 mg Transdermal Daily Viviann Spare, NP      . ondansetron (ZOFRAN) tablet 4 mg  4 mg Oral Q8H PRN Viviann Spare, NP      . potassium chloride SA (K-DUR,KLOR-CON) CR tablet 20 mEq  20 mEq Oral BID Sanjuana Kava, NP      . risperiDONE (RISPERDAL M-TABS) disintegrating tablet 1 mg  1 mg Oral Q6H PRN Viviann Spare, NP      . risperiDONE (RISPERDAL M-TABS) disintegrating tablet 1 mg  1 mg Oral QHS Viviann Spare, NP       Facility-Administered Medications Ordered in Other Encounters  Medication Dose Route Frequency Provider Last Rate Last Dose  . DISCONTD: alum & mag hydroxide-simeth (MAALOX/MYLANTA) 200-200-20 MG/5ML suspension 30 mL  30 mL Oral PRN Grant Fontana, PA      . DISCONTD: ibuprofen (ADVIL,MOTRIN) tablet 600 mg  600 mg Oral Q8H PRN Grant Fontana, [place]      . DISCONTD: LORazepam (ATIVAN) tablet 1 mg  1 mg Oral Q8H PRN Grant Fontana, PA      . DISCONTD: nicotine (NICODERM CQ - dosed in mg/24 hours) patch 21 mg  21 mg Transdermal Daily Grant Fontana, [place]      . DISCONTD: ondansetron (ZOFRAN) tablet 4 mg  4 mg Oral Q8H PRN Grant Fontana, PA      . DISCONTD: zolpidem (AMBIEN) tablet 5 mg  5 mg Oral QHS PRN Grant Fontana, PA        Observation Level/Precautions:  Q 15 minutes checks for safety  Laboratory:  CBC Chemistry Profile ordered for 12/10/11 and 12/14/11  Psychotherapy:  Group counseling and activities.   Medications:  See medication lists  Routine PRN Medications:  Yes  Consultations: None indicated at this time    Discharge Concerns:  Safety  Other:      Sanjuana Kava 4/10/201311:48 AM
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
## 2011-12-07 NOTE — Progress Notes (Signed)
Pt has been isolative to room for much of the day today, pt has chosen not to participate in any activities, pt appears paranoid at times and is still hesitant to share any information other than to say she does not belong here and disputes any facts as to why she is here, pt has also stated that she does not and will not take any medications, pt has made no threats against her person or staff and denies any feelings of suicidality, support provided, will continue to monitor

## 2011-12-08 LAB — CBC
MCH: 21.8 pg — ABNORMAL LOW (ref 26.0–34.0)
MCV: 76.3 fL — ABNORMAL LOW (ref 78.0–100.0)
Platelets: 448 10*3/uL — ABNORMAL HIGH (ref 150–400)
RBC: 4.64 MIL/uL (ref 3.87–5.11)

## 2011-12-08 LAB — DIFFERENTIAL
Eosinophils Absolute: 0.1 10*3/uL (ref 0.0–0.7)
Lymphs Abs: 4.1 10*3/uL — ABNORMAL HIGH (ref 0.7–4.0)
Monocytes Relative: 6 % (ref 3–12)
Neutrophils Relative %: 34 % — ABNORMAL LOW (ref 43–77)

## 2011-12-08 MED ORDER — HALOPERIDOL LACTATE 5 MG/ML IJ SOLN
5.0000 mg | Freq: Every evening | INTRAMUSCULAR | Status: DC | PRN
  Filled 2011-12-08 (×2): qty 1

## 2011-12-08 MED ORDER — BENZTROPINE MESYLATE 1 MG/ML IJ SOLN
1.0000 mg | Freq: Four times a day (QID) | INTRAMUSCULAR | Status: DC | PRN
  Filled 2011-12-08: qty 2

## 2011-12-08 MED ORDER — BENZTROPINE MESYLATE 1 MG/ML IJ SOLN: 1.0000 mg | Freq: Four times a day (QID) | INTRAMUSCULAR | Status: DC | PRN

## 2011-12-08 MED ORDER — PROPRANOLOL HCL 40 MG PO TABS
40.0000 mg | ORAL_TABLET | Freq: Two times a day (BID) | ORAL | Status: DC
  Administered 2011-12-12: 40 mg via ORAL
  Filled 2011-12-08 (×12): qty 1
  Filled 2011-12-08: qty 4

## 2011-12-08 MED ORDER — BENZTROPINE MESYLATE 1 MG PO TABS: 1.0000 mg | ORAL_TABLET | Freq: Four times a day (QID) | ORAL | Status: DC | PRN

## 2011-12-08 NOTE — Progress Notes (Signed)
[facility] MD Progress Note  12/08/2011 4:12 PM  Diagnosis:  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   The patient was seen today and reports the following:   ADL's: Intact.  Sleep: The patient reports to sleeping well last night.  Appetite: The patient reports a good appetite today.   Mild>(1-10) >Severe  Hopelessness (1-10): 0  Depression (1-10): 0  Anxiety (1-10): 0   Suicidal Ideation: The patient denies any suicidal ideations today.  Plan: No  Intent: No  Means: No   Homicidal Ideation: The patient denies any homicidal ideations today.  Plan: No  Intent: No.  Means: No   General Appearance/Behavior: The patient was mildly agitated and minimally cooperative.   Eye Contact: Good.  Speech: Increased in rate and volume today with no pressuring noted.  Motor Behavior: Mildly agitated.  Level of Consciousness: Alert and Oriented x 3.  Mental Status: Alert and Oriented x 3.  Mood: Mildly agitated.  Affect: Mild to moderately Angry.  Anxiety Level: No anxiety reported today.  Thought Process: Paranoid delusions continue.  Thought Content: The patient denies any auditory or visual hallucinations today. She would not comment on delusional thinking but continues to display paranoid delusions.  Perception:. Paranoid.  Judgment: Poor.  Insight: Poor.  Cognition: Oriented to person, place and time.  Sleep:  Number of Hours: 6.75    Vital Signs:Blood pressure 130/89, pulse 151, temperature 97 F (36.1 C), temperature source Oral, resp. rate 18, last menstrual period 12/02/2011, SpO2 98.00%.  Current Medications: Current Facility-Administered Medications  Medication Dose Route Frequency Provider Last Rate Last Dose  . alum & mag hydroxide-simeth (MAALOX/MYLANTA) 200-200-20 MG/5ML suspension 30 mL  30 mL Oral Q4H PRN Viviann Spare, NP      . ferrous sulfate tablet 325 mg  325 mg Oral BID WC Curlene Labrum Albertina Leise, MD   325 mg at 12/08/11 0831  . hydrOXYzine  (ATARAX/VISTARIL) tablet 25 mg  25 mg Oral QHS PRN Viviann Spare, NP      . ibuprofen (ADVIL,MOTRIN) tablet 600 mg  600 mg Oral Q8H PRN Viviann Spare, NP      . LORazepam (ATIVAN) tablet 1 mg  1 mg Oral Q8H PRN Viviann Spare, NP      . magnesium hydroxide (MILK OF MAGNESIA) suspension 30 mL  30 mL Oral Daily PRN Viviann Spare, NP      . nicotine (NICODERM CQ - dosed in mg/24 hours) patch 21 mg  21 mg Transdermal Daily Curlene Labrum Ragen Laver, MD      . ondansetron (ZOFRAN) tablet 4 mg  4 mg Oral Q8H PRN Viviann Spare, NP      . potassium chloride SA (K-DUR,KLOR-CON) CR tablet 20 mEq  20 mEq Oral BID Curlene Labrum Krisann Mckenna, MD   20 mEq at 12/08/11 0832  . risperiDONE (RISPERDAL M-TABS) disintegrating tablet 1 mg  1 mg Oral Q6H PRN Viviann Spare, NP      . risperiDONE (RISPERDAL M-TABS) disintegrating tablet 3 mg  3 mg Oral QHS Ronny Bacon, MD       Lab Results:  Results for orders placed during the hospital encounter of 12/06/11 (from the past 48 hour(s))  DIFFERENTIAL     Status: Abnormal   Collection Time   12/08/11  6:10 AM      Component Value Range Comment   Neutrophils Relative 34 (*) 43 - 77 (%)    Neutro Abs 2.4  1.7 - 7.7 (  K/uL)    Lymphocytes Relative 59 (*) 12 - 46 (%)    Lymphs Abs 4.1 (*) 0.7 - 4.0 (K/uL)    Monocytes Relative 6  3 - 12 (%)    Monocytes Absolute 0.4  0.1 - 1.0 (K/uL)    Eosinophils Relative 1  0 - 5 (%)    Eosinophils Absolute 0.1  0.0 - 0.7 (K/uL)    Basophils Relative 1  0 - 1 (%)    Basophils Absolute 0.1  0.0 - 0.1 (K/uL)   CBC     Status: Abnormal   Collection Time   12/08/11  6:10 AM      Component Value Range Comment   WBC 7.0  4.0 - 10.5 (K/uL)    RBC 4.64  3.87 - 5.11 (MIL/uL)    Hemoglobin 10.1 (*) 12.0 - 15.0 (g/dL)    HCT 16.1 (*) 09.6 - 46.0 (%)    MCV 76.3 (*) 78.0 - 100.0 (fL)    MCH 21.8 (*) 26.0 - 34.0 (pg)    MCHC 28.5 (*) 30.0 - 36.0 (g/dL)    RDW 04.5 (*) 40.9 - 15.5 (%)    Platelets 448 (*) 150 - 400 (K/uL)    Time was  spent today discussing with the patient her current symptoms.  She reports to sleeping well and reports a good appetite.  The patient denies any SI/HI.  She does continue to display much paranoid thinking and again refuses to discuss the specifics of this.  She reports that she has discussed this with staff in 2011 and will not do so again.  The patient is referring to her believe that others are monitoring her and entering her house at night and moving objects.  She also refers to the belief that people are sending her children text messages instructing them to do things against her will.   Considering the severity of the patient's paranoid delusions and considering the potential danger of the patient's behaviors, it is my opinion that forced medication is indicated.   Treatment Plan Summary:  1. Daily contact with patient to assess and evaluate symptoms and progress in treatment  2. Medication management  3. The patient will deny suicidal ideations or homicidal ideations for 48 hours prior to discharge and have a depression and anxiety rating of 3 or less. The patient will also deny any auditory or visual hallucinations or delusional thinking.  4. The patient will deny any symptoms of substance withdrawal at time of discharge.   Plan:  1. Will continue the patient on her currently prescribed medications.  2. Laboratory studies reviewed.  3. Will continue to monitor.  4. Will continue the patient on her IVC till further stabilized.  5. Have request a 2nd opinion from Dr. Koren Shiver for a forced medication order who will see the patient today.   Cristiano Capri 12/08/2011, 4:12 PM

## 2011-12-08 NOTE — Progress Notes (Signed)
Patient refused to complete patient self inventory sheet. Has been sleeping, had not been taking meds but took her FE and K+ pill this morning for this nurse, no other meds were scheduled at that time, had to check out the writing on each pill to make sure it was the correct med for her. Does not attend group. Stays in her room much of the time.denies Si or Hi. q32min safety checks continue and support offered Safety maintained

## 2011-12-08 NOTE — Progress Notes (Signed)
[facility] Group Notes:  (Counselor/Nursing/MHT/Case Management/Adjunct)  12/08/2011 3:56 PM  Type of Therapy:  Group Therapy  Participation Level:  Did Not Attend   Valerie Howard 12/08/2011, 3:56 PM

## 2011-12-08 NOTE — Progress Notes (Signed)
EKG-- was done. After much explaining--pt. Still refused the inderal. After pt. Tried to justify why  she did not need the risperdal  , pt. Got very angry & argumentative , was given med. Ed. & was still refusing. Pt. Finally with much reluctance took the risperdal but is not happy about it. Pt,s speech is pressured & she is paranoid & suspicious about taking meds.[name]

## 2011-12-08 NOTE — Progress Notes (Signed)
[facility] Group Notes:  (Counselor/Nursing/MHT/Case Management/Adjunct)  12/08/2011 3:57 PM  Type of Therapy:  Music Therapy  Participation Level:  Did Not Attend   Valerie Howard 12/08/2011, 3:57 PM

## 2011-12-08 NOTE — Tx Team (Signed)
Interdisciplinary Treatment Plan Update (Adult)  Date:  12/08/2011  Time Reviewed:  10:15AM-11:00AM  Progress in Treatment: Attending groups:  No Participating in groups: No    Taking medication as prescribed:    No, refuses to take medications -- a 2nd opinion is being sought today Tolerating medication:   N/A Family/Significant other contact made:  Yes, with husband Patient understands diagnosis:   No, in denial Discussing patient identified problems/goals with staff:   No, refuses to talk or answer questions Medical problems stabilized or resolved:   N/A Denies suicidal/homicidal ideation:  Adamantly denies Issues/concerns per patient self-inventory:   None Other:    New problem(s) identified: No, Describe:    Reason for Continuation of Hospitalization: Aggression Other; describe Family concern, paranoia, denial of illness  Interventions implemented related to continuation of hospitalization:  Medication monitoring and adjustment, safety checks Q15 min., suicide risk assessment, group therapy, psychoeducation, collateral contact, aftercare planning, ongoing physician assessments, medication education  Additional comments:  Not applicable  Estimated length of stay:  4-6 days  Discharge Plan:  Unknown at this time  New goal(s):  Not applicable  Review of initial/current patient goals per problem list:   1.  Goal(s):  Deny SI / HI for 48 hours prior to D/C.  Met:  No  Target date:  By Discharge   As evidenced by:  Denies today, needs 48 hours  2.  Goal(s):  Reduce psychosis, i.e. Paranoia, to baseline.  Met:  No  Target date:  By Discharge   As evidenced by:  Paranoid, will not discuss anything with staff  3.  Goal(s):  Decide on aftercare provider and living situation.  Met:  No  Target date:  By Discharge   As evidenced by:  Does not want treatment, husband states plans to leave her  4.  Goal(s):  Medication stabilization  Met:  No  Target date:  By  Discharge   As evidenced by:  Refuses to take any medication -- 2nd opinion for forced meds being done.  Attendees: Patient:  Valerie Howard  12/08/2011 10:15AM-11:00AM  Family:     Physician:  Dr. Harvie Heck Readling 12/08/2011 10:15AM-11:00AM  Nursing:   Case Manager:  Ambrose Mantle, LCSW 12/08/2011 10:15AM-11:00AM  Counselor:  [name], MT-BC 12/08/2011 10:15AM-11:00AM  Other:   Lynann Bologna, NP 12/08/2011 10:15AM-11:00AM  Other:      Other:      Other:       Scribe for Treatment Team:   Sarina Ser, 12/08/2011, 10:15AM-11:15AM

## 2011-12-08 NOTE — Progress Notes (Signed)
[facility] Group Notes:  (Counselor/Nursing/MHT/Case Management/Adjunct)  12/08/2011 9:06 AM  Type of Therapy:  Psychoeducational Skills and Group Therapy 12/07/11  Participation Level:  Did Not Attend   [name] 12/08/2011, 9:06 AM
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
## 2011-12-09 NOTE — Progress Notes (Signed)
Currently resting quietly in bed in left facing prone position. Respirations are even and unlabored. No acute distress noted. No subjective signs of pain or discomfort. Safety has been maintained with Q15 minute observation. Will continue current POC.
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
## 2011-12-09 NOTE — Progress Notes (Signed)
Patient ID: Valerie Howard, female   DOB: 20-Sep-1975, 36 y.o.   MRN: [id number] The patient isolated in her room all evening. Did not go to group. Initially refused her HS medications stating she was lied to by the doctor because he said she did not have to take them. Her mood and affect was angry. She did decide to take her medication after much encouragement. Was caught trying to spit the medication out. Offered but refused a snack.

## 2011-12-09 NOTE — Progress Notes (Signed)
[facility] Group Notes:  (Counselor/Nursing/MHT/Case Management/Adjunct)  12/09/2011 11:34 AM  Type of Therapy:  Group Therapy  Participation Level:  Did Not Attend  [name] 12/09/2011, 11:34 AM
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
## 2011-12-09 NOTE — Progress Notes (Signed)
[facility] MD Progress Note  12/09/2011 3:45 PM  Diagnosis:  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   The patient was seen today and reports the following:   ADL's: Intact.  Sleep: The patient reports to sleeping well last night but according to staff slept approximately 4 hours.  Appetite: The patient reports a good appetite today.   Mild>(1-10) >Severe  Hopelessness (1-10): 0  Depression (1-10): 0  Anxiety (1-10): 0   Suicidal Ideation: The patient denies any suicidal ideations today.  Plan: No  Intent: No  Means: No   Homicidal Ideation: The patient denies any homicidal ideations today.  Plan: No  Intent: No.  Means: No   General Appearance/Behavior: The patient was more cooperative today but continues to state that she does not need medications.  She did reluctantly take the medication Risperdal last night but is refusing her Inderal despite having a HR of over 140.  Eye Contact: Good.  Speech: Appropriate in rate and volume with no pressuring noted.  Motor Behavior: Very slightly agitated.  Level of Consciousness: Alert and Oriented x 3.  Mental Status: Alert and Oriented x 3.  Mood: Mildly agitated.  Affect: Slightly constricted.  Anxiety Level: No anxiety reported today.  Thought Process: Paranoid delusions continue.  Thought Content: The patient denies any auditory or visual hallucinations today. She continues to report paranoid delusions related to her family but her affective response to these are lessened today. Perception:. Paranoid.  Judgment: Poor.  Insight: Poor.  Cognition: Oriented to person, place and time.  Sleep:  Number of Hours: 4    Vital Signs:Blood pressure 137/86, pulse 148, temperature 98.6 F (37 C), temperature source Oral, resp. rate 16, last menstrual period 12/02/2011, SpO2 100.00%.  Current Medications: Current Facility-Administered Medications  Medication Dose Route Frequency Provider Last Rate Last Dose  .  alum & mag hydroxide-simeth (MAALOX/MYLANTA) 200-200-20 MG/5ML suspension 30 mL  30 mL Oral Q4H PRN Viviann Spare, NP      . benztropine (COGENTIN) tablet 1 mg  1 mg Oral Q6H PRN Ronny Bacon, MD       Or  . benztropine mesylate (COGENTIN) injection 1 mg  1 mg Intramuscular Q6H PRN Curlene Labrum Ladasia Sircy, MD      . ferrous sulfate tablet 325 mg  325 mg Oral BID WC Curlene Labrum Smt. Loder, MD   325 mg at 12/09/11 7829  . haloperidol lactate (HALDOL) injection 5 mg  5 mg Intramuscular QHS PRN Ronny Bacon, MD      . hydrOXYzine (ATARAX/VISTARIL) tablet 25 mg  25 mg Oral QHS PRN Viviann Spare, NP      . ibuprofen (ADVIL,MOTRIN) tablet 600 mg  600 mg Oral Q8H PRN Viviann Spare, NP      . LORazepam (ATIVAN) tablet 1 mg  1 mg Oral Q8H PRN Viviann Spare, NP      . magnesium hydroxide (MILK OF MAGNESIA) suspension 30 mL  30 mL Oral Daily PRN Viviann Spare, NP      . nicotine (NICODERM CQ - dosed in mg/24 hours) patch 21 mg  21 mg Transdermal Daily Curlene Labrum Sandrine Bloodsworth, MD      . ondansetron (ZOFRAN) tablet 4 mg  4 mg Oral Q8H PRN Viviann Spare, NP      . potassium chloride SA (K-DUR,KLOR-CON) CR tablet 20 mEq  20 mEq Oral BID Curlene Labrum Jasaun Carn, MD   20 mEq at 12/09/11 5621  . propranolol (INDERAL)  tablet 40 mg  40 mg Oral BID Curlene Labrum Ambreen Tufte, MD      . risperiDONE (RISPERDAL M-TABS) disintegrating tablet 3 mg  3 mg Oral QHS Curlene Labrum Jimya Ciani, MD   3 mg at 12/08/11 2242  . DISCONTD: benztropine (COGENTIN) tablet 1 mg  1 mg Oral Q6H PRN Ronny Bacon, MD      . DISCONTD: benztropine mesylate (COGENTIN) injection 1 mg  1 mg Intramuscular Q6H PRN Ronny Bacon, MD      . DISCONTD: risperiDONE (RISPERDAL M-TABS) disintegrating tablet 1 mg  1 mg Oral Q6H PRN Viviann Spare, NP       Lab Results:  Results for orders placed during the hospital encounter of 12/06/11 (from the past 48 hour(s))  DIFFERENTIAL     Status: Abnormal   Collection Time   12/08/11  6:10 AM      Component Value Range  Comment   Neutrophils Relative 34 (*) 43 - 77 (%)    Neutro Abs 2.4  1.7 - 7.7 (K/uL)    Lymphocytes Relative 59 (*) 12 - 46 (%)    Lymphs Abs 4.1 (*) 0.7 - 4.0 (K/uL)    Monocytes Relative 6  3 - 12 (%)    Monocytes Absolute 0.4  0.1 - 1.0 (K/uL)    Eosinophils Relative 1  0 - 5 (%)    Eosinophils Absolute 0.1  0.0 - 0.7 (K/uL)    Basophils Relative 1  0 - 1 (%)    Basophils Absolute 0.1  0.0 - 0.1 (K/uL)   CBC     Status: Abnormal   Collection Time   12/08/11  6:10 AM      Component Value Range Comment   WBC 7.0  4.0 - 10.5 (K/uL)    RBC 4.64  3.87 - 5.11 (MIL/uL)    Hemoglobin 10.1 (*) 12.0 - 15.0 (g/dL)    HCT 16.1 (*) 09.6 - 46.0 (%)    MCV 76.3 (*) 78.0 - 100.0 (fL)    MCH 21.8 (*) 26.0 - 34.0 (pg)    MCHC 28.5 (*) 30.0 - 36.0 (g/dL)    RDW 04.5 (*) 40.9 - 15.5 (%)    Platelets 448 (*) 150 - 400 (K/uL)    Time was spent today discussing with the patient her current symptoms. She reports to sleeping well and reports a good appetite. The patient denies any SI/HI.  She denies any auditory or visual hallucinations.  She continues to display paranoid delusions today.  Much time was spent today discussing with the patient the need for her to take her medications.  The patient reluctantly took the medication Risperdal last night with much encouragement.  She however is refusing the medication Inderal despite having tachycardia.  Considering the severity of the patient's paranoid delusions and considering the potential danger of the patient's behaviors, it is my opinion that forced medications remain necessary.   Treatment Plan Summary:  1. Daily contact with patient to assess and evaluate symptoms and progress in treatment  2. Medication management  3. The patient will deny suicidal ideations or homicidal ideations for 48 hours prior to discharge and have a depression and anxiety rating of 3 or less. The patient will also deny any auditory or visual hallucinations or delusional  thinking.  4. The patient will deny any symptoms of substance withdrawal at time of discharge.   Plan:  1. Will continue the patient on her currently prescribed medications.  2. Laboratory studies reviewed.  3.  Will continue to monitor.  4. Will continue the patient on her IVC till further stabilized.  5. A 2nd opinion from Dr. Koren Shiver for a forced medication was completed yesterday.  6. Forced Medication order for Risperdal ordered yesterday. 7. A repeat CBC with Diff and CMP was ordered for Sunday evening to re-evaluate the patient's anemia and hypokalemia.  Labrian Torregrossa 12/09/2011, 3:45 PM

## 2011-12-09 NOTE — Progress Notes (Signed)
Pt has been in milieu and has attended some milieu activities, pt has been more pleasant upon approach today, more receptive to answer some questions, pt did receive some medications today but has refused both doses of propranolol. Pt was provided with education in regards to medication but declined just the same

## 2011-12-10 NOTE — Progress Notes (Signed)
Pt is irritable and agitated  She is requesting to be discharged that she does not need to be here  She is defensive when talking to staff and she paces the hall  She refused some of her medications this morning   Verbal support given  Medications administered and effectiveness monitored Q 15 min checks Pt safe at present

## 2011-12-10 NOTE — Progress Notes (Signed)
Patient ID: Valerie Howard, female   DOB: 1976-06-01, 36 y.o.   MRN: [id number]   [facility] Group Notes:  (Counselor/Nursing/MHT/Case Management/Adjunct)  12/10/2011 11 AM  Type of Therapy:  Group Therapy, Dance/Movement Therapy    Thomasena Edis, Raed Schalk

## 2011-12-10 NOTE — Progress Notes (Signed)
Patient ID: Valerie Howard, female   DOB: 1976-03-02, 36 y.o.   MRN: [id number] [facility] MD Progress Note  12/10/2011 9:15 PM  Diagnosis:  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   The patient was seen today and reports the following:   ADL's: Intact.  Sleep: The patient reports to sleeping well last night.  Appetite: The patient reports a good appetite today.   Mild>(1-10) >Severe  Hopelessness (1-10): 0  Depression (1-10): 0  Anxiety (1-10): 0   Suicidal Ideation: The patient denies any suicidal ideations today.  Plan: No  Intent: No  Means: No   Homicidal Ideation: The patient denies any homicidal ideations today.  Plan: No  Intent: No.  Means: No   General Appearance/Behavior: The patient was mildly agitated and minimally cooperative.   Eye Contact: Good.  Speech: Increased in rate and volume today with no pressuring noted.  Motor Behavior: Mildly agitated.  Level of Consciousness: Alert and Oriented x 3.  Mental Status: Alert and Oriented x 3.  Mood:  agitated.  Affect:  Angry.  Anxiety Level: No anxiety reported today.  Thought Process: Paranoid delusions continue.  Thought Content: The patient denies any auditory or visual hallucinations today. She would not comment on delusional thinking   Perception:. Paranoid.  Judgment: Poor.  Insight: Poor.  Cognition: Oriented to person, place and time.  Sleep:  Number of Hours: 3.25    Vital Signs:Blood pressure 124/92, pulse 147, temperature 99.5 F (37.5 C), temperature source Oral, resp. rate 16, last menstrual period 12/02/2011, SpO2 97.00%.  Current Medications: Current Facility-Administered Medications  Medication Dose Route Frequency Provider Last Rate Last Dose  . alum & mag hydroxide-simeth (MAALOX/MYLANTA) 200-200-20 MG/5ML suspension 30 mL  30 mL Oral Q4H PRN Viviann Spare, NP      . benztropine (COGENTIN) tablet 1 mg  1 mg Oral Q6H PRN Ronny Bacon, MD       Or  .  benztropine mesylate (COGENTIN) injection 1 mg  1 mg Intramuscular Q6H PRN Curlene Labrum Readling, MD      . ferrous sulfate tablet 325 mg  325 mg Oral BID WC Curlene Labrum Readling, MD   325 mg at 12/09/11 1759  . haloperidol lactate (HALDOL) injection 5 mg  5 mg Intramuscular QHS PRN Ronny Bacon, MD      . hydrOXYzine (ATARAX/VISTARIL) tablet 25 mg  25 mg Oral QHS PRN Viviann Spare, NP      . ibuprofen (ADVIL,MOTRIN) tablet 600 mg  600 mg Oral Q8H PRN Viviann Spare, NP      . LORazepam (ATIVAN) tablet 1 mg  1 mg Oral Q8H PRN Viviann Spare, NP      . magnesium hydroxide (MILK OF MAGNESIA) suspension 30 mL  30 mL Oral Daily PRN Viviann Spare, NP      . ondansetron (ZOFRAN) tablet 4 mg  4 mg Oral Q8H PRN Viviann Spare, NP      . potassium chloride SA (K-DUR,KLOR-CON) CR tablet 20 mEq  20 mEq Oral BID Curlene Labrum Readling, MD   20 mEq at 12/09/11 1759  . propranolol (INDERAL) tablet 40 mg  40 mg Oral BID Curlene Labrum Readling, MD      . risperiDONE (RISPERDAL M-TABS) disintegrating tablet 3 mg  3 mg Oral QHS Curlene Labrum Readling, MD   3 mg at 12/09/11 2151  . DISCONTD: nicotine (NICODERM CQ - dosed in mg/24 hours) patch 21 mg  21  mg Transdermal Daily Ronny Bacon, MD       Lab Results:  No results found for this or any previous visit (from the past 48 hour(s)). Time was spent today discussing with the patient her current symptoms.  She reports to sleeping well and reports a good appetite.  The patient denies any SI/HI.  She continues to display much paranoid thinking. She refused to talk in more details and got very angry after answering few questions.     Plan:   1. Will continue the patient on her currently prescribed medications.   2. Will continue to monitor.    Wonda Cerise 12/10/2011, 9:15 PM

## 2011-12-11 DIAGNOSIS — F200 Paranoid schizophrenia: Principal | ICD-10-CM

## 2011-12-11 LAB — COMPREHENSIVE METABOLIC PANEL
Albumin: 4.1 g/dL (ref 3.5–5.2)
BUN: 20 mg/dL (ref 6–23)
Calcium: 9.9 mg/dL (ref 8.4–10.5)
Creatinine, Ser: 1.06 mg/dL (ref 0.50–1.10)
Potassium: 4.4 mEq/L (ref 3.5–5.1)
Total Protein: 8.2 g/dL (ref 6.0–8.3)

## 2011-12-11 LAB — CBC
HCT: 32.8 % — ABNORMAL LOW (ref 36.0–46.0)
MCHC: 29.3 g/dL — ABNORMAL LOW (ref 30.0–36.0)
MCV: 76.1 fL — ABNORMAL LOW (ref 78.0–100.0)
RDW: 17.5 % — ABNORMAL HIGH (ref 11.5–15.5)

## 2011-12-11 NOTE — Progress Notes (Signed)
Patient ID: Valerie Howard, female   DOB: 04-Mar-1976, 36 y.o.   MRN: [id number] The patient isolated in her room. Very irritable if someone enters her room. Refused offer to attend evening group. Refused offer of snack. Initially she refused her medication, but after it was explained that there was an order to give IM, she accepted. No insight as to why she is in the hospital. Continues to state that she is here because someone lied. Will not explain what she means.

## 2011-12-11 NOTE — Progress Notes (Signed)
Patient ID: Valerie Howard, female   DOB: Jan 12, 1976, 36 y.o.   MRN: [id number] [facility] MD Progress Note  12/11/2011 9:59 PM  Diagnosis:  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   The patient was seen today and reports the following:   ADL's: Intact.  Sleep: The patient reports to sleeping well last night.  Appetite: The patient reports a good appetite today.   Mild>(1-10) >Severe  Hopelessness (1-10): 0  Depression (1-10): 0  Anxiety (1-10): 0   Suicidal Ideation: The patient denies any suicidal ideations today.  Plan: No  Intent: No  Means: No   Homicidal Ideation: The patient denies any homicidal ideations today.  Plan: No  Intent: No.  Means: No   General Appearance/Behavior: The patient was mildly agitated and minimally cooperative.   Eye Contact: Good.  Speech: normal  Motor Behavior: Mildly agitated less than yesterday Level of Consciousness: Alert and Oriented x 3.  Mental Status: Alert and Oriented x 3.  Mood:  agitated.  Affect:  Angry.  Anxiety Level: No anxiety reported today.  Thought Process: Paranoid delusions continue but less focussed on now. Very guarded at times Thought Content: The patient denies any auditory or visual hallucinations today. She would not comment on delusional thinking   Perception:. Paranoid.  Judgment: Poor.  Insight: Poor.  Cognition: Oriented to person, place and time.  Sleep:  Number of Hours: 5    Vital Signs:Blood pressure 121/86, pulse 92, temperature 98.9 F (37.2 C), temperature source Oral, resp. rate 18, last menstrual period 12/02/2011, SpO2 100.00%.  Current Medications: Current Facility-Administered Medications  Medication Dose Route Frequency Provider Last Rate Last Dose  . alum & mag hydroxide-simeth (MAALOX/MYLANTA) 200-200-20 MG/5ML suspension 30 mL  30 mL Oral Q4H PRN Viviann Spare, NP      . benztropine (COGENTIN) tablet 1 mg  1 mg Oral Q6H PRN Ronny Bacon, MD       Or  . benztropine mesylate (COGENTIN) injection 1 mg  1 mg Intramuscular Q6H PRN Curlene Labrum Readling, MD      . ferrous sulfate tablet 325 mg  325 mg Oral BID WC Curlene Labrum Readling, MD   325 mg at 12/09/11 1759  . haloperidol lactate (HALDOL) injection 5 mg  5 mg Intramuscular QHS PRN Ronny Bacon, MD      . hydrOXYzine (ATARAX/VISTARIL) tablet 25 mg  25 mg Oral QHS PRN Viviann Spare, NP      . ibuprofen (ADVIL,MOTRIN) tablet 600 mg  600 mg Oral Q8H PRN Viviann Spare, NP      . LORazepam (ATIVAN) tablet 1 mg  1 mg Oral Q8H PRN Viviann Spare, NP      . magnesium hydroxide (MILK OF MAGNESIA) suspension 30 mL  30 mL Oral Daily PRN Viviann Spare, NP      . ondansetron (ZOFRAN) tablet 4 mg  4 mg Oral Q8H PRN Viviann Spare, NP      . propranolol (INDERAL) tablet 40 mg  40 mg Oral BID Curlene Labrum Readling, MD      . risperiDONE (RISPERDAL M-TABS) disintegrating tablet 3 mg  3 mg Oral QHS Curlene Labrum Readling, MD   3 mg at 12/10/11 2146   Lab Results:  Results for orders placed during the hospital encounter of 12/06/11 (from the past 48 hour(s))  CBC     Status: Abnormal   Collection Time   12/11/11  7:45 PM  Component Value Range Comment   WBC 8.3  4.0 - 10.5 (K/uL)    RBC 4.31  3.87 - 5.11 (MIL/uL)    Hemoglobin 9.6 (*) 12.0 - 15.0 (g/dL)    HCT 16.1 (*) 09.6 - 46.0 (%)    MCV 76.1 (*) 78.0 - 100.0 (fL)    MCH 22.3 (*) 26.0 - 34.0 (pg)    MCHC 29.3 (*) 30.0 - 36.0 (g/dL)    RDW 04.5 (*) 40.9 - 15.5 (%)    Platelets 410 (*) 150 - 400 (K/uL)   COMPREHENSIVE METABOLIC PANEL     Status: Abnormal   Collection Time   12/11/11  7:45 PM      Component Value Range Comment   Sodium 136  135 - 145 (mEq/L)    Potassium 4.4  3.5 - 5.1 (mEq/L)    Chloride 101  96 - 112 (mEq/L)    CO2 27  19 - 32 (mEq/L)    Glucose, Bld 97  70 - 99 (mg/dL)    BUN 20  6 - 23 (mg/dL)    Creatinine, Ser 8.11  0.50 - 1.10 (mg/dL)    Calcium 9.9  8.4 - 10.5 (mg/dL)    Total Protein 8.2  6.0 - 8.3 (g/dL)     Albumin 4.1  3.5 - 5.2 (g/dL)    AST 9  0 - 37 (U/L)    ALT 6  0 - 35 (U/L)    Alkaline Phosphatase 58  39 - 117 (U/L)    Total Bilirubin 0.3  0.3 - 1.2 (mg/dL)    GFR calc non Af Amer 66 (*) >90 (mL/min)    GFR calc Af Amer 77 (*) >90 (mL/min)    Time was spent today discussing with the patient her current symptoms.  Thinks she is ready to go home. She reports sleeping well and reports a good appetite.  The patient denies any SI/HI.  She continues to display paranoid thinking. She got very angry after answering few questions and per pt there is no reason to talk about anything at this time as she is ready to go home now. Not sure where she will stay after discharge.    Plan:   1. Will continue the patient on her currently prescribed medications.   2. Will continue to monitor.    Wonda Cerise 12/11/2011, 9:59 PM

## 2011-12-11 NOTE — Progress Notes (Signed)
Patient ID: ALLESSANDRA BERNARDI, female   DOB: 1975/12/06, 35 y.o.   MRN: [id number]  [facility] Group Notes:  (Counselor/Nursing/MHT/Case Management/Adjunct)  12/11/2011 11 AM  Type of Therapy:  Group Therapy, Dance/Movement Therapy   Participation Level:  Did Not Attend   Rhunette Croft
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
## 2011-12-12 MED ORDER — RISPERIDONE 2 MG PO TBDP
4.0000 mg | ORAL_TABLET | Freq: Every day | ORAL | Status: DC
  Administered 2011-12-12 – 2011-12-15 (×3): 4 mg via ORAL
  Filled 2011-12-12: qty 4
  Filled 2011-12-12 (×7): qty 2

## 2011-12-12 NOTE — Progress Notes (Signed)
Patient has been isolative to her room, Clinical research associate entered her room to find her lying in bed awake and appeared to be reading the bible but her lights were off in her room. Writer introduced self to patient as her nurse for the shift and asked how her day had been. Patient reported that she did not need to be here but has been good. Patient was informed of her hs medication but patient reported that she did not need it and would let me know if she changed her mind. Patient denied having pain, -si/hi/a/v hall.   Patient appears to be preoccupied in thought. Safety maintained on unit, will continue to monitor.

## 2011-12-12 NOTE — Progress Notes (Signed)
[facility] MD Progress Note  12/12/2011 5:02 PM  Diagnosis:  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   The patient was seen today and reports the following:   ADL's: Intact.  Sleep: The patient reports to sleeping well last night but according to staff slept approximately 2.75 hours.  Appetite: The patient reports a good appetite today.   Mild>(1-10) >Severe  Hopelessness (1-10): 0  Depression (1-10): 0  Anxiety (1-10): 0   Suicidal Ideation: The patient denies any suicidal ideations today.  Plan: No  Intent: No  Means: No   Homicidal Ideation: The patient denies any homicidal ideations today.  Plan: No  Intent: No.  Means: No   General Appearance/Behavior: The patient remained more cooperative today but continued to state that she does not need medications. Eye Contact: Good.  Speech: Appropriate in rate and volume with no pressuring noted.  Motor Behavior: wnl.  Level of Consciousness: Alert and Oriented x 3.  Mental Status: Alert and Oriented x 3.  Mood: Essentially Euthymic.  Affect: Slightly constricted.  Anxiety Level: No anxiety reported today.  Thought Process: Paranoid delusions continue.  Thought Content: The patient denies any auditory or visual hallucinations today. She continues to report paranoid delusions related to her family but with less intensity today. Perception:. Paranoid.  Judgment: Poor.  Insight: Poor.  Cognition: Oriented to person, place and time.  Sleep:  Number of Hours: 2.75    Vital Signs:Blood pressure 131/99, pulse 132, temperature 98.8 F (37.1 C), temperature source Oral, resp. rate 16, last menstrual period 12/02/2011, SpO2 100.00%.  Current Medications: Current Facility-Administered Medications  Medication Dose Route Frequency Provider Last Rate Last Dose  . alum & mag hydroxide-simeth (MAALOX/MYLANTA) 200-200-20 MG/5ML suspension 30 mL  30 mL Oral Q4H PRN Viviann Spare, NP      . benztropine (COGENTIN)  tablet 1 mg  1 mg Oral Q6H PRN Ronny Bacon, MD       Or  . benztropine mesylate (COGENTIN) injection 1 mg  1 mg Intramuscular Q6H PRN Curlene Labrum Zema Lizardo, MD      . ferrous sulfate tablet 325 mg  325 mg Oral BID WC Curlene Labrum Patsie Mccardle, MD   325 mg at 12/09/11 1759  . haloperidol lactate (HALDOL) injection 5 mg  5 mg Intramuscular QHS PRN Ronny Bacon, MD      . hydrOXYzine (ATARAX/VISTARIL) tablet 25 mg  25 mg Oral QHS PRN Viviann Spare, NP      . ibuprofen (ADVIL,MOTRIN) tablet 600 mg  600 mg Oral Q8H PRN Viviann Spare, NP      . LORazepam (ATIVAN) tablet 1 mg  1 mg Oral Q8H PRN Viviann Spare, NP      . magnesium hydroxide (MILK OF MAGNESIA) suspension 30 mL  30 mL Oral Daily PRN Viviann Spare, NP      . ondansetron (ZOFRAN) tablet 4 mg  4 mg Oral Q8H PRN Viviann Spare, NP      . propranolol (INDERAL) tablet 40 mg  40 mg Oral BID Curlene Labrum Johntavius Shepard, MD      . risperiDONE (RISPERDAL M-TABS) disintegrating tablet 3 mg  3 mg Oral QHS Curlene Labrum Andrienne Havener, MD   3 mg at 12/11/11 2314   Lab Results:  Results for orders placed during the hospital encounter of 12/06/11 (from the past 48 hour(s))  CBC     Status: Abnormal   Collection Time   12/11/11  7:45 PM  Component Value Range Comment   WBC 8.3  4.0 - 10.5 (K/uL)    RBC 4.31  3.87 - 5.11 (MIL/uL)    Hemoglobin 9.6 (*) 12.0 - 15.0 (g/dL)    HCT 40.9 (*) 81.1 - 46.0 (%)    MCV 76.1 (*) 78.0 - 100.0 (fL)    MCH 22.3 (*) 26.0 - 34.0 (pg)    MCHC 29.3 (*) 30.0 - 36.0 (g/dL)    RDW 91.4 (*) 78.2 - 15.5 (%)    Platelets 410 (*) 150 - 400 (K/uL)   COMPREHENSIVE METABOLIC PANEL     Status: Abnormal   Collection Time   12/11/11  7:45 PM      Component Value Range Comment   Sodium 136  135 - 145 (mEq/L)    Potassium 4.4  3.5 - 5.1 (mEq/L)    Chloride 101  96 - 112 (mEq/L)    CO2 27  19 - 32 (mEq/L)    Glucose, Bld 97  70 - 99 (mg/dL)    BUN 20  6 - 23 (mg/dL)    Creatinine, Ser 9.56  0.50 - 1.10 (mg/dL)    Calcium 9.9  8.4 -  10.5 (mg/dL)    Total Protein 8.2  6.0 - 8.3 (g/dL)    Albumin 4.1  3.5 - 5.2 (g/dL)    AST 9  0 - 37 (U/L)    ALT 6  0 - 35 (U/L)    Alkaline Phosphatase 58  39 - 117 (U/L)    Total Bilirubin 0.3  0.3 - 1.2 (mg/dL)    GFR calc non Af Amer 66 (*) >90 (mL/min)    GFR calc Af Amer 77 (*) >90 (mL/min)    Time was spent today discussing with the patient her current symptoms. She reports to sleeping well and reports a good appetite. The patient denies any SI/HI. She denies any auditory or visual hallucinations. She continues to display paranoid delusions today.  Much time again was spent discussing with the patient her reluctance to take medications.  The patient continues to insist that she does not have a mental illness and does not need medications.  Considering the severity of the patient's paranoid delusions and considering the potential danger of the patient's behaviors, it is my opinion that forced medications remain necessary.   Treatment Plan Summary:  1. Daily contact with patient to assess and evaluate symptoms and progress in treatment  2. Medication management  3. The patient will deny suicidal ideations or homicidal ideations for 48 hours prior to discharge and have a depression and anxiety rating of 3 or less. The patient will also deny any auditory or visual hallucinations or delusional thinking.  4. The patient will deny any symptoms of substance withdrawal at time of discharge.   Plan:  1. Will continue the patient on her currently prescribed medications. 2. Will increase the medication Risperdal M-tabs to 4 mgs po qhs to further address her paranoid delusions.  3. Laboratory studies reviewed.  4. Will continue to monitor.  5. Will continue the patient on her IVC till further stabilized.  6. A 2nd opinion from Dr. Koren Shiver for a forced medication was completed.  7. Forced Medication order for Risperdal remains in effect.   Valerie Howard 12/12/2011, 5:02 PM

## 2011-12-12 NOTE — Progress Notes (Signed)
[facility] Group Notes:  (Counselor/Nursing/MHT/Case Management/Adjunct)  12/12/2011 3:18 PM  Type of Therapy:  Group Therapy  Participation Level:  Did Not Attend   Valerie Howard 12/12/2011, 3:18 PM

## 2011-12-12 NOTE — Progress Notes (Signed)
Patient ID: Valerie Howard, female   DOB: 10-02-1975, 36 y.o.   MRN: [id number] Pt. Awake, alert, NAD. Resting in bed for most of the evening shift.  She is reluctant to take her medications but eventually takes them as she is reminded of the MD's order to force meds if needed.    Reviewed nursing care plan.    Pt. Denies SI/HI/AVH.  States she does not need to be at [facility], that she is going to stop taking all of her meds when she is discharged.  She states she feels like a hypocrite for taking these medications when she tells her children they should not take drugs.

## 2011-12-12 NOTE — Discharge Planning (Signed)
Did not see patient again today, as she does not come to group and does not wish to talk with Case Manager.  Utilization review done for additional days.  Sent additionally-requested information to [facility], then confirmed that patient is now on the [facility] wait list.  Ambrose Mantle, LCSW 12/12/2011, 1:49 PM
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
## 2011-12-13 NOTE — Progress Notes (Signed)
Patient ID: Valerie Howard, female   DOB: 1976-02-20, 36 y.o.   MRN: [id number] Idy presents pleasant, and cooperative, with a rather rigid affect, and artificial appearing smile. She is polite and agrees to talk to me. She makes a point to tell me that she feels no reason that she needs to be here. She wants to go home. When I asked the medication is helping her, she states "it is administered to me." And if I ask her any more questions about medications he simply repeats that same phrase in order to convey that she is not taking the medications....rather it is being given to her.    She is under a force medication order and the record reflects that she is taking the Risperdal.  She denies Depression, anxiety, hopelessness, suicidal or homicidal thoughts.  She clearly states that she will not discuss her home situation or concerns about her marriage.  I asked about her background and schooling and she also refuses to discuss this.  Will not reveal any personal details.  Does say that her husband brought her some clothes but not things she really wanted "but you know how men are with that."   Objective: She is significantly anemic with a hemoglobin of 9 appears to be compensated with pulse of 99, blood pressure 120/83, she denies any physical symptoms. She is refusing to take her propranolol which he feels she does not need and I will discontinue it. She took her iron supplement this morning.  Mental status exam: Fully alert female, she is directable but passively cooperative. Affect is rather flat with a mechanical smile, and forced politeness. Speech is nonpressured. She is quite guarded and defensive in mood and manner. Denying any dangerous thoughts. Guards her history carefully and will not reveal emotions, thoughts, or details about past history. Insight minimal. Judgment poor, impulse control fair.  Assessment: I suspect that this is her baseline. Will discuss with him the possibility of  discharge and see if we're allowed to speak with her husband.

## 2011-12-13 NOTE — Progress Notes (Signed)
[facility] Group Notes:  (Counselor/Nursing/MHT/Case Management/Adjunct)  12/13/2011 2:17 PM  Type of Therapy:  Group Therapy  Participation Level:  Did Not Attend    [name] 12/13/2011, 2:17 PM

## 2011-12-13 NOTE — Progress Notes (Signed)
HS meds given. Writer waited about 5 minutes while M-tabs dissolved and pt opened her mouth to show she had taken them. However, as soon as Clinical research associate left the room, pt went to her bathroom, closed the door and turned on her water. Writer is suspicious pt tried to vomit her meds up.

## 2011-12-13 NOTE — Progress Notes (Signed)
Pt. Has been isolating to her room.Pt. Told this Clinical research associate that she wanted another nurse. Pt. Did not give a reason but, I was her nurse the first day she had a force med order.Lawernce Ion RN .talked to the pt. & told pt. That he would be giving her HS meds.Pt.became argumentative stating that she did not need meds.Pt.then stated we,ll talk about it later.Pt. Was informed that there is still a force  Med. Order.
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
## 2011-12-13 NOTE — Progress Notes (Signed)
Currently resting quietly in bed in right lateral position with eyes closed. Respirations are even and unlabored. No acute distress noted. Safety has been maintained with Q15 minute observation. Will continue current POC.  

## 2011-12-13 NOTE — Progress Notes (Signed)
Adult Services Patient-Family Contact/Session  Attendees:  Patient's husband,  Fayrene Fearing [phone])   Goal(s):  Update. Discharge planning  Safety Concerns:  Concerned about patient being around children  Narrative:  Reported to husband that patient would most likely be discharged within the week. She is taking her Risperdal on her own now but has refused other medications. She states that she will not take medications when she leaves here because there is nothing wrong with her. Reported to husband that patient is slightly more cooperative but remains paranoid and has no insight. The medications may or may not address the paranoia but hopefully after an extended time, there will be an observable change. At this time patient is not meeting criteria to remain in the hospital due to not being suicidal and is denying any harm to others. Patient is also refusing to attend any programming. Reported to husband that patient  has been told about the CPS report. Husband reported to counselor that CPS had visited yesterday and that it was state mandated that patient only have supervised visits. They had told him that with patient going to [facility], the kids would have a longer time to adjust to things. Counselor reported to husband that at this time, patient was on the wait list for [facility] but may not meet criteria for transfer. Husband stated that he feels he has no other choice at this time and that he plans not to let patient come home. There is a period after school that the 38 year old twins and the 38 year old are at home until the 38 year old arrives later. He can not arrange to have supervision with patient and the children at this time because he has to work. He does not want to do anything that would risk losing the children. Husband stated that patient's parents will not let her stay there even though she is an only child and they have several extra bedrooms. They have just left the country for a 3 week vacation.  Patient's going to a shelter was discussed, but he did not like this option. He could not identify any other alternatives.  Husband was distressed that patient was saying that she would not take medications even though this might make her lose the kids. He continues to state he loves her and wishes things would be better. Counselor encouraged husband to have a conversation with patient about the necessity of taking her medications and the natural consequences that would happen if she doesn't. He stated they have been calling her in the morning and he plans to have this conversation.    Barrier(s):  Patient's non-compliance and her lack of insight  Interventions:  Information, support, discharge planning  Recommendation(s):  Out-patient follow up.   Follow-up Required:  Yes  Explanation:  Husband was encouraged to come up with a discharge plan and report to counselor. Counselor will notify him of discharge date.  HartisAram Beecham 12/13/2011, 3:40 PM

## 2011-12-13 NOTE — Progress Notes (Signed)
Currently resting quietly in bed in left facing prone position with eyes closed. Respirations are even and unlabored. No acute distress noted. No subjective signs of pain or discomfort. Safety has been maintained with Q15 minute observation. Will continue current POC.
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
## 2011-12-14 NOTE — Progress Notes (Signed)
[facility] MD Progress Note  12/14/2011 1:52 PM  Diagnosis:  Axis I: Schizophrenia - Paranoid Type.   The patient was seen today and reports the following:   ADL's: Intact.  Sleep: The patient reports to sleeping well last night but according to staff slept approximately 3.5 hours.  Appetite: The patient reports a good appetite today.   Mild>(1-10) >Severe  Hopelessness (1-10): 0  Depression (1-10): 0  Anxiety (1-10): 0   Suicidal Ideation: The patient adamantly denies any suicidal ideations today.  Plan: No  Intent: No  Means: No   Homicidal Ideation: The patient adamantly denies any homicidal ideations today.  Plan: No  Intent: No.  Means: No   General Appearance/Behavior: The patient remained more cooperative today but continued to state that she does not need medications.  Eye Contact: Good.  Speech: Appropriate in rate and volume with no pressuring noted.  Motor Behavior: wnl.  Level of Consciousness: Alert and Oriented x 3.  Mental Status: Alert and Oriented x 3.  Mood: Essentially Euthymic.  Affect: Slightly constricted.  Anxiety Level: No anxiety reported today.  Thought Process: Paranoid delusions continue but are less intense.  Thought Content: The patient denies any auditory or visual hallucinations today. She continues to report paranoid delusions related to her family but with less intensity today. Perception:. Paranoia thinking continues but less intense.  Judgment: Fair.  Insight: Poor.  Cognition: Oriented to person, place and time.  Sleep:  Number of Hours: 3.5    Vital Signs:Blood pressure 144/98, pulse 102, temperature 98 F (36.7 C), temperature source Oral, resp. rate 18, last menstrual period 12/02/2011, SpO2 100.00%.  Current Medications: Current Facility-Administered Medications  Medication Dose Route Frequency Provider Last Rate Last Dose  . alum & mag hydroxide-simeth (MAALOX/MYLANTA) 200-200-20 MG/5ML suspension 30 mL  30 mL Oral Q4H PRN Viviann Spare, NP      . benztropine (COGENTIN) tablet 1 mg  1 mg Oral Q6H PRN Ronny Bacon, MD       Or  . benztropine mesylate (COGENTIN) injection 1 mg  1 mg Intramuscular Q6H PRN Curlene Labrum Gurveer Colucci, MD      . ferrous sulfate tablet 325 mg  325 mg Oral BID WC Curlene Labrum Chakara Bognar, MD   325 mg at 12/14/11 4540  . haloperidol lactate (HALDOL) injection 5 mg  5 mg Intramuscular QHS PRN Ronny Bacon, MD      . hydrOXYzine (ATARAX/VISTARIL) tablet 25 mg  25 mg Oral QHS PRN Viviann Spare, NP      . ibuprofen (ADVIL,MOTRIN) tablet 600 mg  600 mg Oral Q8H PRN Viviann Spare, NP      . LORazepam (ATIVAN) tablet 1 mg  1 mg Oral Q8H PRN Viviann Spare, NP      . magnesium hydroxide (MILK OF MAGNESIA) suspension 30 mL  30 mL Oral Daily PRN Viviann Spare, NP      . ondansetron (ZOFRAN) tablet 4 mg  4 mg Oral Q8H PRN Viviann Spare, NP      . risperiDONE (RISPERDAL M-TABS) disintegrating tablet 4 mg  4 mg Oral QHS Curlene Labrum Vickey Ewbank, MD   4 mg at 12/13/11 2132   Lab Results:  No results found for this or any previous visit (from the past 48 hour(s)).  Time was spent today discussing with the patient her current symptoms. She continues to report to sleeping well and reports a good appetite. The patient adamantly denies any SI/HI. She continues to deny  any auditory or visual hallucinations. She does continue to display paranoid delusions today but with less intensity.  Much time was spent today discussing with the patient the basis of her paranoia.  She reports that she and her husband have separated in 2009 and again in 2011.  Each time she reports that items belonging to her have disappeared.  She also states that she has furniture which was given to her by her grandfather when he died.  She states that some furniture items will disappear from her home and then later appear.  Treatment Plan Summary:  1. Daily contact with patient to assess and evaluate symptoms and progress in treatment  2. Medication  management  3. The patient will deny suicidal ideations or homicidal ideations for 48 hours prior to discharge and have a depression and anxiety rating of 3 or less. The patient will also deny any auditory or visual hallucinations or delusional thinking.  4. The patient will deny any symptoms of substance withdrawal at time of discharge.   Plan:  1. Will continue the patient on her currently prescribed medications. 2. Laboratory studies reviewed.  3. Will continue to monitor.  4. Will discharge the patient once she is interviewed by CPS and placement is arranged.  Damoni Causby 12/14/2011, 1:52 PM

## 2011-12-14 NOTE — Discharge Planning (Signed)
Met with patient in the hall at her request.  She always requests a update on her status, specifically is interested in knowledge re any potential discharge.  Case Manager contacted Department of Social Services Child Protective Services worker Marcelle Smiling, [phone], to request  (1) a visit with patient to inform her of the plan being put in place with regard to children and  (2) confirmation re accuracy of husband's understanding that patient cannot return home.  Awaiting call back.  Ambrose Mantle, LCSW 12/14/2011, 2:56 PM

## 2011-12-14 NOTE — Progress Notes (Signed)
[facility] Group Notes:  (Counselor/Nursing/MHT/Case Management/Adjunct)  12/14/2011 2:11 PM  Type of Therapy:  Psychoeducational Skills  Participation Level:  Did Not Attend     Valerie Howard 12/14/2011, 2:11 PM

## 2011-12-14 NOTE — Progress Notes (Signed)
Patient ID: Valerie Howard, female   DOB: Oct 30, 1975, 36 y.o.   MRN: [id number] Pt denies SI/HI/AVH.  She reported on her self inventory that she slept well, appetite good, normal energy level, ability to pay attention good, no pain.  [name] put N/A for all the other questions on her self inventory.  She remains irritable, did take her iron pill with some encouragement, restless at times.

## 2011-12-15 NOTE — Progress Notes (Signed)
[facility] Group Notes:  (Counselor/Nursing/MHT/Case Management/Adjunct)  12/15/2011 2:10 PM  Type of Therapy:  Group Therapy  Participation Level:  Did Not Attend  :   [name] 12/15/2011, 2:10 PM

## 2011-12-15 NOTE — Progress Notes (Signed)
Patient ID: Valerie Howard, female   DOB: 1975-09-24, 36 y.o.   MRN: [id number] Pt denies SI/HI/AVH, refused her iron supplement this am, guarded, paranoid.  She reported on her self inventory that she slept well, appetite good, energy level normal, ability to pay attention good, put N/A on the rest of her self inventory.

## 2011-12-15 NOTE — Progress Notes (Signed)
[facility] MD Progress Note  12/15/2011 4:27 PM  Diagnosis:  Axis I: Schizophrenia - Paranoid Type.   The patient was seen today and reports the following:   ADL's: Intact.  Sleep: The patient reports to sleeping well last night but according to staff she again slept approximately 3.5 hours.  Appetite: The patient reports a good appetite today.   Mild>(1-10) >Severe  Hopelessness (1-10): 0  Depression (1-10): 0  Anxiety (1-10): 0   Suicidal Ideation: The patient adamantly denies any suicidal ideations today.  Plan: No  Intent: No  Means: No   Homicidal Ideation: The patient adamantly denies any homicidal ideations today.  Plan: No  Intent: No.  Means: No   General Appearance/Behavior: The patient remained more cooperative today and was able to appropriately intact with treatment team about the CPS report.  Eye Contact: Good.  Speech: Appropriate in rate and volume with no pressuring noted.  Motor Behavior: wnl.  Level of Consciousness: Alert and Oriented x 3.  Mental Status: Alert and Oriented x 3.  Mood: Essentially Euthymic.  Affect: Slightly constricted.  Anxiety Level: No anxiety reported today.  Thought Process: Paranoid delusions continue but are less intense.  Thought Content: The patient denies any auditory or visual hallucinations today. She continues to report paranoid delusions related to her family but with less intensity today. Perception:. Paranoia thinking continues but less intense.  Judgment: Fair.  Insight: Poor.  Cognition: Oriented to person, place and time.  Sleep:  Number of Hours: 3.5    Vital Signs:Blood pressure 142/93, pulse 108, temperature 98.3 F (36.8 C), temperature source Oral, resp. rate 18, last menstrual period 12/02/2011, SpO2 100.00%.  Current Medications: Current Facility-Administered Medications  Medication Dose Route Frequency Provider Last Rate Last Dose  . alum & mag hydroxide-simeth (MAALOX/MYLANTA) 200-200-20 MG/5ML suspension 30  mL  30 mL Oral Q4H PRN Viviann Spare, NP      . benztropine (COGENTIN) tablet 1 mg  1 mg Oral Q6H PRN Ronny Bacon, MD       Or  . benztropine mesylate (COGENTIN) injection 1 mg  1 mg Intramuscular Q6H PRN Curlene Labrum Neveah Bang, MD      . ferrous sulfate tablet 325 mg  325 mg Oral BID WC Curlene Labrum Yasaman Kolek, MD   325 mg at 12/14/11 1655  . haloperidol lactate (HALDOL) injection 5 mg  5 mg Intramuscular QHS PRN Ronny Bacon, MD      . hydrOXYzine (ATARAX/VISTARIL) tablet 25 mg  25 mg Oral QHS PRN Viviann Spare, NP      . ibuprofen (ADVIL,MOTRIN) tablet 600 mg  600 mg Oral Q8H PRN Viviann Spare, NP      . LORazepam (ATIVAN) tablet 1 mg  1 mg Oral Q8H PRN Viviann Spare, NP      . magnesium hydroxide (MILK OF MAGNESIA) suspension 30 mL  30 mL Oral Daily PRN Viviann Spare, NP      . ondansetron (ZOFRAN) tablet 4 mg  4 mg Oral Q8H PRN Viviann Spare, NP      . risperiDONE (RISPERDAL M-TABS) disintegrating tablet 4 mg  4 mg Oral QHS Curlene Labrum Nikeshia Keetch, MD   4 mg at 12/13/11 2132   Lab Results:  No results found for this or any previous visit (from the past 48 hour(s)).  Time was spent today discussing with the patient her current symptoms. She continues to report to sleeping well and reports a good appetite. She continues to adamantly denies  any SI/HI as well as any auditory or visual hallucinations. She does continue to display some paranoid delusions today but with less intensity in comparison to time of admission.  The patient was much more appropriate with treatment team today and was able to appropriately process information that she may not be allowed to return home due to the recent CPS investigation.  The treatment team learned today that child protective services will meet with the patient tomorrow with probable discharge following this meeting.    Treatment Plan Summary:  1. Daily contact with patient to assess and evaluate symptoms and progress in treatment  2. Medication  management  3. The patient will deny suicidal ideations or homicidal ideations for 48 hours prior to discharge and have a depression and anxiety rating of 3 or less. The patient will also deny any auditory or visual hallucinations or delusional thinking.  4. The patient will deny any symptoms of substance withdrawal at time of discharge.   Plan:  1. Will continue the patient on her currently prescribed medications. 2. Laboratory studies reviewed.  3. Will continue to monitor.  4. Will discharge the patient tomorrow once she is interviewed by CPS and placement is arranged.  Jawana Reagor 12/15/2011, 4:27 PM
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
## 2011-12-15 NOTE — Tx Team (Signed)
Interdisciplinary Treatment Plan Update (Adult)  Date:  12/15/2011  Time Reviewed:  10:15AM-11:00AM  Progress in Treatment: Attending groups:  No, refuses all Participating in groups:    No Taking medication as prescribed:    No, refuses Tolerating medication:   No problems when has taken Family/Significant other contact made:  Yes, with husband Patient understands diagnosis:   No, denies all Discussing patient identified problems/goals with staff:   Yes Medical problems stabilized or resolved:   Yes Denies suicidal/homicidal ideation:  Yes Issues/concerns per patient self-inventory:   None Other:    New problem(s) identified: Yes, Describe:  We have not heard back from CPS worker,  so it has not been verified that patient cannot return to the home  Reason for Continuation of Hospitalization: Medication stabilization Other; describe Need contact with Child Protective Services to discuss whether patient can return there.  Also, patient is refusing medications still.  Interventions implemented related to continuation of hospitalization:  Medication monitoring and adjustment, safety checks Q15 min., suicide risk assessment, group therapy, psychoeducation, collateral contact, aftercare planning, ongoing physician assessments, medication education  Additional comments:  Not applicable  Estimated length of stay:  1-3 dats  Discharge Plan:  Unknown at this time.  May not be able to return to her home, is unable to identify an alternative plan, refuses to speak with shelter staff who can come to the hospital.  Refuses all meds while in the hospital (on forced med order) and does not intend to follow up at discharge.  New goal(s):  Not applicable  Review of initial/current patient goals per problem list:   1.  Goal(s):  Deny SI / HI for 48 hours prior to D/C.  Met:  Yes  Target date:  By Discharge   As evidenced by:  Denies SI/HI adamantly  2.  Goal(s):  Reduce psychosis, i.e.  Paranoia, to baseline.  Met:  No  Target date:  By Discharge   As evidenced by:  Remains paranoid, although may be getting close to baseline in level of paranoia.  3.  Goal(s):  Decide on aftercare provider and living situation.  Met:  No  Target date:  By Discharge   As evidenced by:  Unknown at this time  4.  Goal(s):  Medication stabilization  Met:  No  Target date:  By Discharge   As evidenced by:  Patient refuses meds, is on forced med order, although it was not enforced last night.  Attendees: Patient:  Valerie Howard  12/15/2011 10:15AM-11:00AM  Family:     Physician:  Dr. Harvie Heck Readling 12/15/2011 10:15AM-11:00AM  Nursing:   Nanine Means, RN 12/15/2011 10:15AM -11:00AM   Case Manager:  Ambrose Mantle, LCSW 12/15/2011 10:15AM-11:00AM  Counselor:  [name], MT-BC 12/15/2011 10:15AM-11:00AM  Other:   Lynann Bologna, NP 12/15/2011 10:15AM-11:00AM  Other:      Other:      Other:       Scribe for Treatment Team:   Sarina Ser, 12/15/2011, 10:15AM-11:15AM

## 2011-12-15 NOTE — Progress Notes (Signed)
Pt denies SI/HI/AVH. Pt isolative. Pt remained in her room entire night. Pt refused 10pm medication stating that she did not need it and has been doing fine today. States that she has not been bothering anyone. Explained importance of continuing with medication treatment, however pt still refused. Support and encouragement offered. Pt receptive.

## 2011-12-15 NOTE — Progress Notes (Signed)
Patient took her medication this afternoon without difficulty.

## 2011-12-16 MED ORDER — FERROUS SULFATE 325 (65 FE) MG PO TABS
325.0000 mg | ORAL_TABLET | Freq: Two times a day (BID) | ORAL | Status: DC
  Filled 2011-12-16: qty 28

## 2011-12-16 MED ORDER — RISPERIDONE 2 MG PO TBDP
4.0000 mg | ORAL_TABLET | Freq: Every day | ORAL | Status: DC
  Filled 2011-12-16: qty 28

## 2011-12-16 MED ORDER — HALOPERIDOL LACTATE 2 MG/ML PO CONC
5.0000 mg | Freq: Every day | ORAL | Status: DC
  Administered 2011-12-16 – 2011-12-19 (×4): 5 mg via ORAL
  Filled 2011-12-16 (×6): qty 2.5

## 2011-12-16 MED ORDER — HALOPERIDOL LACTATE 5 MG/ML IJ SOLN
5.0000 mg | Freq: Four times a day (QID) | INTRAMUSCULAR | Status: DC | PRN
  Filled 2011-12-16: qty 1

## 2011-12-16 NOTE — Progress Notes (Signed)
Pt has been up and attended treatment team meeting this morning and discussed with staff her aftercare plans, talked about not going to take her medications when she leaves here and has spoken about not really taking her medications while she was here, also spoke about not having a place to go to at discharge and mentioned living in a tent

## 2011-12-16 NOTE — Progress Notes (Signed)
[facility] Group Notes:  (Counselor/Nursing/MHT/Case Management/Adjunct)  12/16/2011 2:47 PM  Type of Therapy:  Group Therapy  Participation Level:  Did Not Attend  :   Valerie Howard 12/16/2011, 2:47 PM

## 2011-12-16 NOTE — Tx Team (Signed)
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  During this treatment team, patient states that we only think she takes her medications, that she is not really taking them.  While it is documented that staff stays in the room while the Risperdal M-Tab dissolves, she was very coy and adamant she is not taking her meds.  Thus, it is believed that patient is likely forcing herself to vomit the pills after staff leaves the room. Tolerating medication:   No reported side effects by patient, no side effects noted by staff Family/Significant other contact made:  Yes, with husband and with social services Patient understands diagnosis:   No, in total denial.  Staff has tried multiple times, even during this meeting, to talk with her about even a possibility she has paranoia, but she refuses to allow anyone engaged in that topic to finish a sentence, insists she does not need that kind of help, does not have a problem, and says that we just don't understand all the people and dynamics around her (such as 2 people named Stanton Kidney, 2 people named Asher Muir, 2 people named Lamont, and more) Discussing patient identified problems/goals with staff:   Yes, is discussing at length although is often vague and not easy to understand Medical problems stabilized or resolved:   Yes Denies suicidal/homicidal ideation:  Yes Issues/concerns per patient self-inventory:   None Other:    New problem(s) identified: Yes, Describe:  DSS worker is present at the meeting, and it is stated that patient cannot be around children alone.  Husband has stated she cannot return to the home because he cannot provide supervision for her with the children.   Additionally, she cannot come stay there and then just leave when he is not there, as he will not allow this.  Reason for Continuation of Hospitalization: Delusions  Medication stabilization Other; describe paranoia, refusal of any medications  Interventions implemented related to continuation of hospitalization:  Medication monitoring and adjustment, safety checks Q15 min., suicide risk assessment, group therapy, psychoeducation, collateral contact, aftercare planning, ongoing physician assessments, medication education  Additional comments:  Patient talked at length about knowing what she needs, and what her body needs,  She admitted to ignoring OB/GYN's instructions when she developed gestational diabetes, regarding both medication and bed rest.  She has no insight into how that could compromise her babies' health.  Likewise, she has no insight into any need for medication now.  Estimated length of stay:  3-4 days  Discharge Plan:  Patient cannot identify anywhere at all that she can stay at discharge since she cannot return to the family home.  The idea of [facility] is met with absolute rejection, and she also states that last time she was here, she said she would go to the shelter but had no intention of going, and did not go in fact.  She cannot identify any family member or friend where she could stay for even 24 hours at discharge.  Husband has stated she absolutely cannot return there with the current Child Protective Services requirements in place, as he has to work and cannot provide the required supervision.  Patient refuses any notion of taking any medications, and  thus refuses all follow-up appointments.    New goal(s):  Not applicable  Review of initial/current patient goals per problem list:   1.  Goal(s):  Deny SI / HI for 48 hours prior to D/C.  Met:  Yes  Target date:  By Discharge   As evidenced by:  Has not been suicidal/homicidal.  Is not at this time, either.  2.   Goal(s):  Reduce psychosis, i.e. Paranoia, to baseline.  Met:  No  Target date:  By Discharge   As evidenced by:  Paranoia may well be at her most recent baseline, but it still has impeded her ability to live in the community, thus needs further addressing prior to discharge.  She has lost ability to be with her children alone.  This had led to her losing her housing.  When asked about taking medications, she states that she will not take any medications, that she knows what she needs, and that does not include any medications for paranoia she does not believe she has.  CPS worker informed patient that refusal to take medications recommended by the doctor could result in continued inability to be with the children, and she replied that she still would not take the medication, indicating that what will happen will happen without her having the ability to control it.  This displays compromised thinking by this mother who at other times has been tearful at being apart from her children just through this hospitalization period, and who has been so involved in all aspects of her children's lives up to this point.    3.  Goal(s):  Decide on aftercare provider and living situation.  Met:  No  Target date:  By Discharge   As evidenced by:  Refuses any aftercare.  Refuses to discuss going to the shelter.  Cannot return to her home with husband and children.  Cannot identify other relatives or friends she could stay with.  4.  Goal(s):  Medication stabilization  Met:  No  Target date:  By Discharge   As evidenced by:  Patient has been on forced meds orders, but as explained above, it appears she has been cheeking and/or vomiting up her medications.  She is adamantly opposed to medication.  Doctor will order an injectable anti-psychotic today, to be administered as a forced med if necessary.  Attendees: Patient:  Valerie Howard  12/16/2011 11:23 AM   Family:     Physician:  Dr. Harvie Heck  Readling 12/16/2011 11:24 AM   Nursing:   Tacy Learn, RN 12/16/2011 11:24 AM   Case Manager:  Ambrose Mantle, LCSW 12/16/2011 11:24 AM   Counselor:  [name], MT-BC 12/16/2011 11:24 AM   Other:   Lynann Bologna, NP 12/16/2011 11:24 AM   Other:   Marcelle Smiling, DSS CPS worker 12/16/2011 11:21 AM   Other:      Other:       Scribe for Treatment Team:   Sarina Ser, 12/16/2011, 11:24 AM

## 2011-12-16 NOTE — Progress Notes (Signed)
Pt has isolated in her room and did not attend group  She initially refused her medications  Then tried to postpone taking it   Finally did take the risperdal and staff stayed in her room with her for additional time to make sure she let it dissolve in her mouth  She was accusatory and in denial of illness or need for medication  She articulates well and is frequently argumentative   She has been frequently instructed on medication compliance and the reason for her being here but pt refuses to acknowledge it   She is paranoid of medications and asks to have the pill wrappers   Verbal support given  Medications administered and effectiveness monitored  Q 15 min checks  Pt safe at present

## 2011-12-16 NOTE — Discharge Planning (Signed)
As usual, patient did not attend Aftercare Planning Group.  She did meet with DSS Child Arts development officer, as arranged by this Clinical research associate.  She also attended Treatment Team, see Update for details.  Ambrose Mantle, LCSW 12/16/2011, 3:13 PM

## 2011-12-16 NOTE — Progress Notes (Signed)
[facility] MD Progress Note  12/16/2011 3:27 PM  Diagnosis:  Axis I: Schizophrenia - Paranoid Type.   The patient was seen today and reports the following:   ADL's: Intact.  Sleep: The patient reports to sleeping well last night and according to staff she slept approximately 5.25 hours.  Appetite: The patient reports a good appetite today.   Mild>(1-10) >Severe  Hopelessness (1-10): 0  Depression (1-10): 0  Anxiety (1-10): 0   Suicidal Ideation: The patient adamantly denies any suicidal ideations today.  Plan: No  Intent: No  Means: No   Homicidal Ideation: The patient adamantly denies any homicidal ideations today.  Plan: No  Intent: No.  Means: No   General Appearance/Behavior: The patient remained cooperative today but became very defensive when discussing her CPS issues. Eye Contact: Good.  Speech: Appropriate in rate and volume with mild pressuring noted.  Motor Behavior: wnl.  Level of Consciousness: Alert and Oriented x 3.  Mental Status: Alert and Oriented x 3.  Mood: Essentially Euthymic.  Affect: Slightly constricted.  Anxiety Level: No anxiety reported today.  Thought Process: Paranoid delusions continue today.  Thought Content: The patient denies any auditory or visual hallucinations today. She continues to report paranoid delusions related to her family and continues to insist she does not need medications.  Perception:. Paranoia thinking continues.  Judgment: Fair to poor.  Insight: Poor.  Cognition: Oriented to person, place and time.  Sleep:  Number of Hours: 5.25    Vital Signs:Blood pressure 133/87, pulse 125, temperature 98.4 F (36.9 C), temperature source Oral, resp. rate 18, last menstrual period 12/02/2011, SpO2 98.00%.  Current Medications: Current Facility-Administered Medications  Medication Dose Route Frequency Provider Last Rate Last Dose  . alum & mag hydroxide-simeth (MAALOX/MYLANTA) 200-200-20 MG/5ML suspension 30 mL  30 mL Oral Q4H PRN  Viviann Spare, NP      . benztropine (COGENTIN) tablet 1 mg  1 mg Oral Q6H PRN Ronny Bacon, MD       Or  . benztropine mesylate (COGENTIN) injection 1 mg  1 mg Intramuscular Q6H PRN Curlene Labrum Windy Dudek, MD      . ferrous sulfate tablet 325 mg  325 mg Oral BID WC Curlene Labrum Aelyn Stanaland, MD   325 mg at 12/15/11 1700  . haloperidol lactate (HALDOL) injection 5 mg  5 mg Intramuscular QHS PRN Ronny Bacon, MD      . hydrOXYzine (ATARAX/VISTARIL) tablet 25 mg  25 mg Oral QHS PRN Viviann Spare, NP      . ibuprofen (ADVIL,MOTRIN) tablet 600 mg  600 mg Oral Q8H PRN Viviann Spare, NP      . LORazepam (ATIVAN) tablet 1 mg  1 mg Oral Q8H PRN Viviann Spare, NP      . magnesium hydroxide (MILK OF MAGNESIA) suspension 30 mL  30 mL Oral Daily PRN Viviann Spare, NP      . ondansetron (ZOFRAN) tablet 4 mg  4 mg Oral Q8H PRN Viviann Spare, NP      . risperiDONE (RISPERDAL M-TABS) disintegrating tablet 4 mg  4 mg Oral QHS Curlene Labrum Janecia Palau, MD   4 mg at 12/15/11 2150  . DISCONTD: ferrous sulfate tablet 325 mg  325 mg Oral BID WC Curlene Labrum Dean Wonder, MD      . DISCONTD: risperiDONE (RISPERDAL M-TABS) disintegrating tablet 4 mg  4 mg Oral QHS Ronny Bacon, MD       Lab Results: No results found for this  or any previous visit (from the past 48 hour(s)).  Time was spent today discussing with the patient her current symptoms.  The patient continues to state that her husband and his family are "doing things to me that no one believes."  The patient was invited to treatment team with a case worker from CPS.  She was informed that she could not return home without supervision when her children are home.  The patient states that she has no place to go and has no idea where she will stay tonight if discharged.  However the patient states that she continues to wish to be discharged.  The patient continues to insist that she does not require medications and reports to the treatment team today that she has not  been taking her Risperdal M-tabs despite precautions.  Much time was spent discussing with the treatment team an appropriate plan for the patient.  It was decided that the patient would remain in the hospital for further treatment.   Treatment Plan Summary:  1. Daily contact with patient to assess and evaluate symptoms and progress in treatment  2. Medication management  3. The patient will deny suicidal ideations or homicidal ideations for 48 hours prior to discharge and have a depression and anxiety rating of 3 or less. The patient will also deny any auditory or visual hallucinations or delusional thinking.  4. The patient will deny any symptoms of substance withdrawal at time of discharge.   Plan:  1. Will discontinue the medication Risperdal M-tabs due to the patient reporting that she is somehow not taking the medication at night.  2. Will start the medication Haldol 5 mgs liquid po qhs for psychosis to help assure compliance. 3. Will ask that the patient be placed on a 1:1 for 45 minutes after taking the medication Haldol to also help   4. Laboratory studies reviewed.  5. Will continue to monitor.  6. The patient remains on a forced medication order for antipsychotic medications.  Liberato Stansbery 12/16/2011, 3:27 PM

## 2011-12-16 NOTE — Progress Notes (Addendum)
Patient ID: YARELIZ THORSTENSON, female   DOB: 05-20-1976, 36 y.o.   MRN: [id number] Pt. denies lethality and A/V/H's and insists she does not know why she is here and refuses to take any and all medications, including ferrous sulfate.  Pt. came to the nursing station ahead of the HS medication pass time and reiterated her frustration with "going through this big (argument) every night about my not wanting to take any medication."  Message was passed to night charge nurse who is familiar with this patient and who has agreed to talk with her about taking her medication tonight. 23:15--Pt. has again refused to take her Haldol from anyone and insists she "was never told about this new medication".  Oral liquid was refused: Pt. was confronted with the knowledge that she will receive an injection by forced med.: Judieth Keens, RN delivered the medication to the patient with a female MHT in the room. 23:30--Pt. finally accepted the oral liquid form of Haldol, but MHT heard Pt. trying to cough out the medication.

## 2011-12-17 NOTE — Progress Notes (Signed)
Patient ID: ABBIGALE MCELHANEY, female   DOB: Jan 18, 1976, 36 y.o.   MRN: [id number] Patient ID: MAILYN STEICHEN, female   DOB: [birth year], 36 y.o.   MRN: [id number] [facility] MD Progress Note  12/17/2011 9:51 PM  Diagnosis:  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   The patient was seen today and reports the following:   ADL's: Intact.  Sleep: The patient reports sleeping well last night.  Appetite: The patient reports a good appetite today.   Mild>(1-10) >Severe  Hopelessness (1-10): 0  Depression (1-10): 0  Anxiety (1-10): 0   Suicidal Ideation: The patient denies any suicidal ideations today.  Plan: No  Intent: No  Means: No   Homicidal Ideation: The patient denies any homicidal ideations today.  Plan: No  Intent: No.  Means: No   General Appearance/Behavior: The patient was mildly agitated and minimally cooperative.   Eye Contact: Good.  Speech: normal  Motor Behavior: Mildly agitated  Level of Consciousness: Alert and Oriented x 3.  Mental Status: Alert and Oriented x 3.  Mood:  ok Affect:  Angry.  Anxiety Level: No anxiety reported today.  Thought Process: Paranoid delusions continue but less focussed on now. Very guarded at times Thought Content: The patient denies any auditory or visual hallucinations today. She would not comment on delusional thinking   Perception:. Paranoid.  Judgment: Poor.  Insight: Poor.  Cognition: Oriented to person, place and time.  Sleep:  Number of Hours: 1.25    Vital Signs:Blood pressure 132/95, pulse 96, temperature 98.2 F (36.8 C), temperature source Oral, resp. rate 16, last menstrual period 12/02/2011, SpO2 98.00%.  Current Medications: Current Facility-Administered Medications  Medication Dose Route Frequency Provider Last Rate Last Dose  . alum & mag hydroxide-simeth (MAALOX/MYLANTA) 200-200-20 MG/5ML suspension 30 mL  30 mL Oral Q4H PRN Viviann Spare, NP      . benztropine  (COGENTIN) tablet 1 mg  1 mg Oral Q6H PRN Ronny Bacon, MD       Or  . benztropine mesylate (COGENTIN) injection 1 mg  1 mg Intramuscular Q6H PRN Curlene Labrum Readling, MD      . ferrous sulfate tablet 325 mg  325 mg Oral BID WC Curlene Labrum Readling, MD   325 mg at 12/15/11 1700  . haloperidol (HALDOL) 2 MG/ML solution 5 mg  5 mg Oral QHS Curlene Labrum Readling, MD   5 mg at 12/16/11 2323  . haloperidol lactate (HALDOL) injection 5 mg  5 mg Intramuscular Q6H PRN Ronny Bacon, MD      . hydrOXYzine (ATARAX/VISTARIL) tablet 25 mg  25 mg Oral QHS PRN Viviann Spare, NP      . ibuprofen (ADVIL,MOTRIN) tablet 600 mg  600 mg Oral Q8H PRN Viviann Spare, NP      . LORazepam (ATIVAN) tablet 1 mg  1 mg Oral Q8H PRN Viviann Spare, NP      . magnesium hydroxide (MILK OF MAGNESIA) suspension 30 mL  30 mL Oral Daily PRN Viviann Spare, NP      . ondansetron (ZOFRAN) tablet 4 mg  4 mg Oral Q8H PRN Viviann Spare, NP       Lab Results:  No results found for this or any previous visit (from the past 48 hour(s)). Time was spent today discussing with the patient her current symptoms.  Thinks she is ready to go home. She reports sleeping well and reports a good appetite.  The  patient denies any SI/HI.  She continues to display paranoid thinking. She got very angry after answering few questions and reported that she already has answered the other questions in the past.    Plan:   1. Will continue the patient on her currently prescribed medications.   2. Will continue to monitor.    Wonda Cerise 12/17/2011, 9:51 PM

## 2011-12-17 NOTE — Progress Notes (Signed)
Pt isolates to her room and does not interact with others  She does go to the cafeteria for meals  She refused her medications this morning which was an iron pill  She does not attend or participate in groups   Verbal support given  Medications offered and educated on effects of non compliance  Q 15 min checks  Pt safe at present

## 2011-12-17 NOTE — Progress Notes (Signed)
[facility] Group Notes:  (Counselor/Nursing/MHT/Case Management/Adjunct)  12/17/2011 11 AM  Type of Therapy:  Group Therapy, Dance/Movement Therapy   Participation Level:  Did Not Attend  Pt. Did not attend aftercare planning group.    Gevena Mart

## 2011-12-17 NOTE — Progress Notes (Signed)
Patient ID: Valerie Howard, female   DOB: Jun 28, 1976, 36 y.o.   MRN: [id number] The patient spent the evening in her room as usual. No interaction in the milieu. Did not attend evening group. Refused offer of evening snack. She was compliant with taking her HS medication this evening. Accepted having staff sit with her for 45 minutes after medication was given.

## 2011-12-17 NOTE — Progress Notes (Signed)
Patient ID: Valerie Howard, female   DOB: 12-06-1975, 36 y.o.   MRN: [id number] The patient has been resting in bed, but not sleeping. Pleasant and her behavior and conversation is appropriate. Asked to have her clothes washed so that she had clean clothes to put on when in the morning. At first wanted to go with tech to the laundry room because she didn't trust anyone doing her laundry. However, she did allowed the MHT to take her clothes to the laundry room without her.

## 2011-12-18 NOTE — Progress Notes (Signed)
Patient ID: Valerie Howard, female   DOB: Oct 09, 1975, 36 y.o.   MRN: [id number] [facility] MD Progress Note  12/18/2011 8:24 PM  Diagnosis:  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   The patient was seen today and reports the following:   ADL's: Intact.  Sleep: The patient reports sleeping well last night.  Appetite: The patient reports a good appetite today.   Mild>(1-10) >Severe  Hopelessness (1-10): 0  Depression (1-10): 0  Anxiety (1-10): 0   Suicidal Ideation: The patient denies any suicidal ideations today.  Plan: No  Intent: No  Means: No   Homicidal Ideation: The patient denies any homicidal ideations today.  Plan: No  Intent: No.  Means: No   General Appearance/Behavior: The patient was mildly agitated and minimally cooperative.   Eye Contact: Good.  Speech: normal  Motor Behavior: calm Level of Consciousness: Alert and Oriented x 3.  Mental Status: Alert and Oriented x 3.  Mood:  ok Affect:  Angry.  Anxiety Level: No anxiety reported today.  Thought Process: Very guarded at times Thought Content: The patient denies any auditory or visual hallucinations today. She would not comment on delusional thinking   Perception:. Paranoid.  Judgment: Poor.  Insight: Poor.  Cognition: Oriented to person, place and time.  Sleep:  Number of Hours: 3.25    Vital Signs:Blood pressure 129/80, pulse 108, temperature 98.4 F (36.9 C), temperature source Oral, resp. rate 24, last menstrual period 12/02/2011, SpO2 98.00%.  Current Medications: Current Facility-Administered Medications  Medication Dose Route Frequency Provider Last Rate Last Dose  . alum & mag hydroxide-simeth (MAALOX/MYLANTA) 200-200-20 MG/5ML suspension 30 mL  30 mL Oral Q4H PRN Viviann Spare, NP      . benztropine (COGENTIN) tablet 1 mg  1 mg Oral Q6H PRN Ronny Bacon, MD       Or  . benztropine mesylate (COGENTIN) injection 1 mg  1 mg Intramuscular Q6H PRN Curlene Labrum  Readling, MD      . ferrous sulfate tablet 325 mg  325 mg Oral BID WC Curlene Labrum Readling, MD   325 mg at 12/15/11 1700  . haloperidol (HALDOL) 2 MG/ML solution 5 mg  5 mg Oral QHS Curlene Labrum Readling, MD   5 mg at 12/18/11 2021  . haloperidol lactate (HALDOL) injection 5 mg  5 mg Intramuscular Q6H PRN Ronny Bacon, MD      . hydrOXYzine (ATARAX/VISTARIL) tablet 25 mg  25 mg Oral QHS PRN Viviann Spare, NP      . ibuprofen (ADVIL,MOTRIN) tablet 600 mg  600 mg Oral Q8H PRN Viviann Spare, NP      . LORazepam (ATIVAN) tablet 1 mg  1 mg Oral Q8H PRN Viviann Spare, NP      . magnesium hydroxide (MILK OF MAGNESIA) suspension 30 mL  30 mL Oral Daily PRN Viviann Spare, NP      . ondansetron (ZOFRAN) tablet 4 mg  4 mg Oral Q8H PRN Viviann Spare, NP       Lab Results:  No results found for this or any previous visit (from the past 48 hour(s)). Time was spent today discussing with the patient her current symptoms.  Thinks she is ready to go home. Wants to talk with her primary team tomorrow about the discharge plan for her. She reports sleeping well and reports a good appetite.  The patient denies any SI/HI.  She refused to answers after answering few  questions and reported that she already has answered the other questions in the past.    Plan:   1. Will continue the patient on her currently prescribed medications.   2. Will continue to monitor.    Wonda Cerise 12/18/2011, 8:24 PM

## 2011-12-18 NOTE — Progress Notes (Signed)
Pt has isolated to her room today but has gone to the cafeteria for meals  She does not attend groups   She refused her iron pill this morning  She has no interaction with others and mostly just lays in her bed  Verbal support offered and pt encouraged to participate in unit activities Q 15 min checks  Pt safe at present

## 2011-12-18 NOTE — Progress Notes (Signed)
[facility] Group Notes:  (Counselor/Nursing/MHT/Case Management/Adjunct)  12/18/2011 11:53 AM  Type of Therapy:  Group Therapy  Participation Level:  Did Not Attend    Neila Gear 12/18/2011, 11:53 AM

## 2011-12-19 NOTE — Progress Notes (Signed)
[facility] MD Progress Note  12/19/2011 2:31 PM  Diagnosis:  Axis I: Schizophrenia - Paranoid Type.   The patient was seen today and reports the following:   ADL's: Intact.  Sleep: The patient reports to sleeping well last night but according to staff she slept approximately 3.25 hours.  Appetite: The patient reports a good appetite today.   Mild>(1-10) >Severe  Hopelessness (1-10): 0  Depression (1-10): 0  Anxiety (1-10): 0   Suicidal Ideation: The patient adamantly denies any suicidal ideations today.  Plan: No  Intent: No  Means: No   Homicidal Ideation: The patient adamantly denies any homicidal ideations today.  Plan: No  Intent: No.  Means: No   General Appearance/Behavior: The patient remains cooperative today with this provider but continues to state that she will not take medications at discharge. Eye Contact: Good.  Speech: Appropriate in rate and volume with no pressuring noted.  Motor Behavior: wnl.  Level of Consciousness: Alert and Oriented x 3.  Mental Status: Alert and Oriented x 3.  Mood: Essentially Euthymic.  Affect: Full today.  Anxiety Level: No anxiety reported today.  Thought Process: Paranoid delusions continue today related to her family.  Thought Content: The patient denies any auditory or visual hallucinations today. She continues to report paranoid delusions related to her family.  Perception:. Paranoia thinking continues.  Judgment: Fair to poor.  Insight: Poor.  Cognition: Oriented to person, place and time.  Sleep:  Number of Hours: 3.25    Vital Signs:Blood pressure 130/94, pulse 120, temperature 98.9 F (37.2 C), temperature source Oral, resp. rate 16, last menstrual period 12/02/2011, SpO2 100.00%.  Current Medications: Current Facility-Administered Medications  Medication Dose Route Frequency Provider Last Rate Last Dose  . alum & mag hydroxide-simeth (MAALOX/MYLANTA) 200-200-20 MG/5ML suspension 30 mL  30 mL Oral Q4H PRN Viviann Spare,  NP      . benztropine (COGENTIN) tablet 1 mg  1 mg Oral Q6H PRN Ronny Bacon, MD       Or  . benztropine mesylate (COGENTIN) injection 1 mg  1 mg Intramuscular Q6H PRN Curlene Labrum Terrill Alperin, MD      . ferrous sulfate tablet 325 mg  325 mg Oral BID WC Curlene Labrum Shalayne Leach, MD   325 mg at 12/15/11 1700  . haloperidol (HALDOL) 2 MG/ML solution 5 mg  5 mg Oral QHS Curlene Labrum Emmakate Hypes, MD   5 mg at 12/18/11 2021  . haloperidol lactate (HALDOL) injection 5 mg  5 mg Intramuscular Q6H PRN Ronny Bacon, MD      . hydrOXYzine (ATARAX/VISTARIL) tablet 25 mg  25 mg Oral QHS PRN Viviann Spare, NP      . ibuprofen (ADVIL,MOTRIN) tablet 600 mg  600 mg Oral Q8H PRN Viviann Spare, NP      . LORazepam (ATIVAN) tablet 1 mg  1 mg Oral Q8H PRN Viviann Spare, NP      . magnesium hydroxide (MILK OF MAGNESIA) suspension 30 mL  30 mL Oral Daily PRN Viviann Spare, NP      . ondansetron (ZOFRAN) tablet 4 mg  4 mg Oral Q8H PRN Viviann Spare, NP       Lab Results: No results found for this or any previous visit (from the past 48 hour(s)).  Time was spent today discussing with the patient her current symptoms.  The patient continues to state that her husband and his family are "doing things to me that no one believes."  She  however states that she does not need medications to help with this because "it is true" what they are doing.  She reports to sleeping well at night but according to staff she slept approximately 3.25 hours.  She also denies any depression, anxiety or SI/HI.  The patient also denies any auditory or visual hallucinations today.  Time was spent also discussing with the patient her plan for housing once discharged.  The patient states that she plans to talk to her husband about making arrangements to be in the home only when he is present.  She plans to discuss this today with her husband and the counselor.  Treatment Plan Summary:  1. Daily contact with patient to assess and evaluate symptoms and  progress in treatment  2. Medication management  3. The patient will deny suicidal ideations or homicidal ideations for 48 hours prior to discharge and have a depression and anxiety rating of 3 or less. The patient will also deny any auditory or visual hallucinations or delusional thinking.  4. The patient will deny any symptoms of substance withdrawal at time of discharge.   Plan:  1. Will continue the medication Haldol liquid 5 mgs po qhs for paranoid thoughts and will remain a liquid to help assure compliance with medications. 2. Laboratory studies reviewed.  3. Will continue to monitor.  4. The patient remains on a forced medication order for antipsychotic medications.  Cimberly Stoffel 12/19/2011, 2:31 PM

## 2011-12-19 NOTE — Progress Notes (Signed)
Patient ID: Valerie Howard, female   DOB: [birth year], 36 y.o.   MRN: [id number] Pt. continues to deny lethality and A/V/H's, but mood remains irritable, frequently and Pt. Is continually guarded and questioning everything asked of her: she is adamant about not taking any medications, refusing to take even FeSo4, the only other medication offered to her beyond the HS medication.  Pt. Only came out of her room for mealtime, preferring to remain isolative in her room. 18:00--Again offered FeSO4 to Pt. after she ate her evening meal: "why are you pursuing this?" Pt. attempted to argue over my reasoning for offering it to her after she had eaten; she had refused even in the mealtime line because, "I'm going to supper now", after the earlier refusal.  Pt. returned to her room again. 23:00--Pt. was given her HS med. By Minerva Areola, RN, [facility], who stayed with Pt. for 45 mins. after taking it.  Pt. was initially questioning the necessity of taking the medication, but remained calm, and quietly in bed.[name]

## 2011-12-19 NOTE — Progress Notes (Signed)
Patient ID: Valerie Howard, female   DOB: 1975/10/23, 36 y.o.   MRN: [id number] The patient continues to isolate in her room. No interaction in the milieu. Does not want to take medication because she believes she does not need it. However, she was compliant with taking medication. 1:1 maintained for 45 minutes after taking medication. Pleasant and appropriate with staff.

## 2011-12-19 NOTE — Progress Notes (Signed)
[facility] Group Notes:  (Counselor/Nursing/MHT/Case Management/Adjunct)  12/19/2011 2:24 PM  Type of Therapy:  Group Therapy  Participation Level:  Did Not Attend    [name] 12/19/2011, 2:24 PM

## 2011-12-19 NOTE — Discharge Planning (Signed)
Visited with patient in her room at her request.  As usual, she did not attend group.  Patient wanted to know her status regarding discharge.  Case Manager informed her that we are awaiting a conversation with her husband to ensure that he has a plan in place to facilitate her being with the children while he is present.  If he has no other suggestions about where patient can go when he is not in the home, we plan to provide her information about the shelter in case she decides she wants to go there, although she has not previously been willing to entertain that possibility.  No case management needs expressed.  Patient only wants to discharge.  Has stated she has no intentions of following up or continuing on medications.  Will advise her of [facility] walk-in clinic hours.  Ambrose Mantle, LCSW 12/19/2011, 12:16 PM

## 2011-12-19 NOTE — Progress Notes (Signed)
Pt has been in room for much of the day today, very minimal interaction or participation in milieu activities, pt did go to cafeteria for meals, has asked about her status and has spoken about wanting to be discharged, pt chose not to take ferrous sulfate this morning, pt has not verbalized any complaints, support provided, will continue to monitor

## 2011-12-20 MED ORDER — RISPERIDONE 2 MG PO TABS
4.0000 mg | ORAL_TABLET | Freq: Every day | ORAL | Status: DC
  Filled 2011-12-20: qty 2
  Filled 2011-12-20: qty 28

## 2011-12-20 MED ORDER — RISPERIDONE 4 MG PO TABS: 4.0000 mg | ORAL_TABLET | Freq: Every day | ORAL | Status: DC

## 2011-12-20 NOTE — Progress Notes (Signed)
Lying in bed with eyes closed.  May not have been asleep every time checked but no verbal response to softly calling of her name.  Safety checks conducted Q 15 minutes.

## 2011-12-20 NOTE — Tx Team (Signed)
Interdisciplinary Treatment Plan Update (Adult)  Date:  12/20/2011  Time Reviewed:  10:15AM-11:00AM  Progress in Treatment: Attending groups:  No, refuses    Participating in groups:  No   Taking medication as prescribed:    Only under pressure of forced meds Tolerating medication:   Is having some physical twitching possibly associated with Haldol, so prior to D/C will switch back to Risperdal Family/Significant other contact made:  Yes, with husband Patient understands diagnosis:   No, total denial Discussing patient identified problems/goals with staff:   Yes Medical problems stabilized or resolved:   Yes Denies suicidal/homicidal ideation:  Yes Issues/concerns per patient self-inventory:   None Other:    New problem(s) identified: Yes, Describe:  physical reactions to Haldol  Reason for Continuation of Hospitalization: None  Interventions implemented related to continuation of hospitalization:  Medication monitoring and adjustment, safety checks Q15 min., suicide risk assessment, group therapy, psychoeducation, collateral contact, aftercare planning, ongoing physician assessments, medication education - UNTIL DISCHARGE  Additional comments:  Not applicable  Estimated length of stay:  Discharge today  Discharge Plan:  Return to the home with husband, leave the home when the husband leaves the home, follow up is to be determined  New goal(s):  Not applicable  Review of initial/current patient goals per problem list:   1.  Goal(s):  Deny SI / HI for 48 hours prior to D/C.  Met:  Yes  Target date:  By Discharge   As evidenced by:  Previously met  2.  Goal(s):  Reduce psychosis, i.e. Paranoia, to baseline.  Met:  Yes  Target date:  By Discharge   As evidenced by:  Remains paranoid, but is at baseline with it  3.  Goal(s):  Decide on aftercare provider and living situation.  Met:  Yes  Target date:  By Discharge   As evidenced by:  Will follow up at [facility] where her family already receives services through DSS/CSP interaction, will live at home but not be alone with the children  4.  Goal(s):  Medication stabilization  Met:  Yes  Target date:  By Discharge   As evidenced by:  To the extent possible, but will continue to work on this outpatient basis.  Agrees to take medication in order to live with the family.  Attendees: Patient:  Valerie Howard  12/20/2011 10:15AM-11:00AM  Family:     Physician:  Dr. Harvie Heck Readling 12/20/2011 10:15AM-11:00AM  Nursing:   Izola Price, RN 12/20/2011 10:15AM -11:00AM   Case Manager:  Ambrose Mantle, LCSW 12/20/2011 10:15AM-11:00AM  Counselor:  [name], MT-BC 12/20/2011 10:15AM-11:00AM  Other:   Lynann Bologna, NP 12/20/2011 10:15AM-11:00AM  Other:      Other:      Other:       Scribe for Treatment Team:   Sarina Ser, 12/20/2011, 10:15AM-11:15AM

## 2011-12-20 NOTE — Progress Notes (Signed)
Adult Services Patient-Family Contact/Session  Attendees:  Patient's husband, Beatris Si ([phone])                      Late entry: 12/19/11 and 12/20/11 Goal(s):  Discharge planning  Safety Concerns: None  Narrative:  Talked with husband on Monday about discharge for patient. He stated that she can come back to the house if she will take her medications. He also stated in order to comply with CPS she will need to leave the house when he leaves the house. Counselor informed husband that patient continues to state that she will not take medications when she leaves the hospital. Informed him that she was changed to a liquid form medication here due to her reporting that she had not been taking medications. He could not identify any other options. Discussed possibility of staying at her mother's house who is out of the country for 3 weeks. They have no way to reach them but patient does have a key to the house. He stated that she does not get along with her father and his wife and that mother probably doesn't want to fool with her if she won't take her medications. Discussed with husband the treatment team last week with patient and the CPS worker. Informed him that patient was told that it would not help her case not to take medications. Discussed that at the time, there was no reasoning with patient.  Encouraged husband to have a conversation with patient about their plans and expectations. He stated that he would do this and that he wanted to support her as best he can. He stated that she had already called and asked if he could pick her up. Husband was to call counselor and leave a message after discussion. Talked with husband this morning. He stated they had a conversation and that patient had agreed to take her medications. He also informed her that they just needed to do what they had to do to comply with CPS, which also meant leaving the house when he left. She also agreed to this too. He stated  that he discussed with her that he was not going to lose the kids. He has also talked to the kids about their behavior and trying to help patient. Also discussed with them the plan when she comes home. He plans to pick her up around 4:30 or 5:00 after he gets off work. Expressed a concern that patient had reported that the medicine was making her shake. Informed him that counselor would inform doctor. Also discussed medications for the future and that it might be helpful for patient to be on an injection. This can be followed up with outpatient doctor.  Barriers):  Patient's lack of insight, and non-compliance  Interventions:  Discharge planning  Recommendation(s):  Outpatient follow up, CPS follow up  Follow-up Required:  No  Explanation:    Jacayla Nordell, Aram Beecham 12/20/2011, 8:25 AM

## 2011-12-20 NOTE — Progress Notes (Signed)
D/C instructions were explained to Pt & husband.Pt. Denies SI, HI, & AVH. Pt. & husband verbalized understanding of all d/c instructions. Samples of medications were given along with pt,s belongings. Supported & encouraged.
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
## 2011-12-20 NOTE — Progress Notes (Signed)
Patient has spent most of the day in her room.  Attended Treatment Team this morning and is happy about being discharged later today.  Agrees to take her medications and understands the consequences she faces if she does not take them.  Continues to refuse iron, but states she will take her risperidone when she gets home.

## 2011-12-20 NOTE — Progress Notes (Signed)
[facility] Case Management Discharge Plan:  Will you be returning to the same living situation after discharge: Yes,  after much discussion, the patient agrees to stay on medication in order to stay in the home with her husband and children.  She agrees that she will leave the home when the husband is not present in order to comply with DSS/CPS instructions. At discharge, do you have transportation home?:Yes,  Husband will pick up, transport home Do you have the ability to pay for your medications:Yes,  Insurance and income  Interagency Information:     Release of information consent forms completed and in the chart;  Patient's signature needed at discharge.  Patient to Follow up at:  Follow-up Information    Follow up with Dr. Sheria Lang on 12/21/2011. (2:30PM appointment.  At that time, you will see the doctor for medication management and talk with doctor about therapy needs.)    Contact information:   [facility] [address] [place]  [address] Telephone:  [phone]         Patient denies SI/HI:   Yes,  has denied since coming into hospital    Safety Planning and Suicide Prevention discussed:  No.  Not indicated and patient not open to this.  Barrier to discharge identified:Yes,  If at the last minute, patient refuses to go to the appointment  Summary and Recommendations:  During patient's time in the hospital, a CPS report was made about concerns re childrens' safety.  As a result, patient may not be alone with the children.  This caused there to be a lengthy thought- and planning process about where she can live and how she can be supervised with the children.  Also, she has been on a forced med order in order to achieve medication compliance.  The husband's requirement for her to come home with him is that (1) she must stay on medication, and (2) she cannot be at the home when he is not personally present.  An appointment has been set at the same agency  which has already done a Comprehensive Clinical Assessment, in order for her to see the doctor for medication management and to discuss therapy needs.   Sarina Ser 12/20/2011, 2:29 PM

## 2011-12-23 NOTE — Progress Notes (Signed)
Patient Discharge Instructions:  Psychiatric Admission Assessment Note Provided,  12/22/2011 After Visit Summary (AVS) Provided,  12/22/2011 Face Sheet Provided, 12/22/2011 Faxed/Sent to the Next Level Care provider:  12/22/2011 Provided Suicide Risk Assessment - Discharge Assessment 12/22/2011  Faxed to [facility] Care Service - Dr. Sheria Lang [phone]  Wandra Scot, 12/23/2011, 2:09 PM
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
## 2012-09-06 ENCOUNTER — Encounter (HOSPITAL_COMMUNITY): Payer: Self-pay | Admitting: Emergency Medicine

## 2012-09-06 ENCOUNTER — Emergency Department (HOSPITAL_COMMUNITY)
Admission: EM | Admit: 2012-09-06 | Discharge: 2012-09-09 | Disposition: A | Discharge status: Other Acute Inpatient Hospital | Payer: Self-pay | Attending: Emergency Medicine | Admitting: Emergency Medicine

## 2012-09-06 ENCOUNTER — Other Ambulatory Visit: Payer: Self-pay

## 2012-09-06 DIAGNOSIS — Z3202 Encounter for pregnancy test, result negative: Secondary | ICD-10-CM | POA: Insufficient documentation

## 2012-09-06 DIAGNOSIS — F200 Paranoid schizophrenia: Secondary | ICD-10-CM | POA: Insufficient documentation

## 2012-09-06 HISTORY — DX: Delusional disorders: F22

## 2012-09-06 LAB — COMPREHENSIVE METABOLIC PANEL
Albumin: 3.8 g/dL (ref 3.5–5.2)
Alkaline Phosphatase: 56 U/L (ref 39–117)
BUN: 16 mg/dL (ref 6–23)
Calcium: 9.4 mg/dL (ref 8.4–10.5)
Creatinine, Ser: 0.91 mg/dL (ref 0.50–1.10)
GFR calc Af Amer: >90 mL/min (ref >90)
Glucose, Bld: 125 mg/dL — ABNORMAL HIGH (ref 70–99)
Potassium: 3.5 mEq/L (ref 3.5–5.1)
Total Protein: 7.7 g/dL (ref 6.0–8.3)

## 2012-09-06 LAB — ETHANOL: Alcohol, Ethyl (B): <11 mg/dL (ref 0–11)

## 2012-09-06 LAB — RAPID URINE DRUG SCREEN, HOSP PERFORMED
Amphetamines: NOT DETECTED
Benzodiazepines: NOT DETECTED
Opiates: NOT DETECTED

## 2012-09-06 LAB — CBC
HCT: 28.8 % — ABNORMAL LOW (ref 36.0–46.0)
Hemoglobin: 8.5 g/dL — ABNORMAL LOW (ref 12.0–15.0)
MCH: 21 pg — ABNORMAL LOW (ref 26.0–34.0)
MCHC: 29.5 g/dL — ABNORMAL LOW (ref 30.0–36.0)
MCV: 71.1 fL — ABNORMAL LOW (ref 78.0–100.0)
RDW: 17.9 % — ABNORMAL HIGH (ref 11.5–15.5)

## 2012-09-06 LAB — POCT PREGNANCY, URINE: Preg Test, Ur: NEGATIVE

## 2012-09-06 MED ORDER — ZIPRASIDONE MESYLATE 20 MG IM SOLR
20.0000 mg | Freq: Once | INTRAMUSCULAR | Status: DC
  Filled 2012-09-06 (×2): qty 20

## 2012-09-06 MED ORDER — ZOLPIDEM TARTRATE 5 MG PO TABS: 5.0000 mg | ORAL_TABLET | Freq: Every evening | ORAL | Status: DC | PRN

## 2012-09-06 MED ORDER — ONDANSETRON HCL 4 MG PO TABS: 4.0000 mg | ORAL_TABLET | Freq: Three times a day (TID) | ORAL | Status: DC | PRN

## 2012-09-06 MED ORDER — LORAZEPAM 1 MG PO TABS
1.0000 mg | ORAL_TABLET | Freq: Three times a day (TID) | ORAL | Status: DC | PRN
  Filled 2012-09-06: qty 1

## 2012-09-06 MED ORDER — LORAZEPAM 2 MG/ML IJ SOLN
2.0000 mg | Freq: Once | INTRAMUSCULAR | Status: AC
  Administered 2012-09-06: 2 mg via INTRAMUSCULAR
  Filled 2012-09-06: qty 1

## 2012-09-06 MED ORDER — NICOTINE 21 MG/24HR TD PT24: 21.0000 mg | MEDICATED_PATCH | Freq: Every day | TRANSDERMAL | Status: DC

## 2012-09-06 MED ORDER — ZIPRASIDONE MESYLATE 20 MG IM SOLR
20.0000 mg | Freq: Two times a day (BID) | INTRAMUSCULAR | Status: DC | PRN
  Administered 2012-09-07: 20 mg via INTRAMUSCULAR

## 2012-09-06 MED ORDER — IBUPROFEN 600 MG PO TABS: 600.0000 mg | ORAL_TABLET | Freq: Three times a day (TID) | ORAL | Status: DC | PRN

## 2012-09-06 NOTE — ED Notes (Signed)
Patient states "Im tired of playing this game".
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
## 2012-09-06 NOTE — ED Provider Notes (Addendum)
Pt without problems during the night.  No current issues.  Awaiting assessment and placement.  Rolan Bucco, MD 09/06/12 0732  Pt has been tachycardic since arrival.  BP stable.  Will tx anxiety to see if that helps, but I reviewed her records and she was persistently tachycardic during last [facility] stay in April  Her Hgb is also similar to her past recent values.    Date: 09/06/2012  Rate: 94  Rhythm: normal sinus rhythm  QRS Axis: normal  Intervals: normal  ST/T Wave abnormalities: normal  Conduction Disutrbances:none  Narrative Interpretation:   Old EKG Reviewed: unchanged   Rolan Bucco, MD 09/06/12 1223  Rolan Bucco, MD 09/06/12 1533

## 2012-09-06 NOTE — ED Notes (Addendum)
Patient extremely anxious refusing Ativan 1 mg po w/repeated pleads, received order for Ativan 2 mg IM, given, to hopefully slow her HR down and to decrease her anxiety. Stated again "I am not playing these games". "I don't trust you all, I don't know you". Patient finally gave in and allowed the injection to be given

## 2012-09-06 NOTE — ED Notes (Signed)
Patient up and asking for personal items to take a shower, supplies given and shower taken

## 2012-09-06 NOTE — Plan of Care (Addendum)
Valerie Howard  [id number] [birth year]  09/06/2012  Patient presented for admission. Chart reviewed. Patient meets criteria for in patient admission.  ED will need to evaluate and treat anemia, tachycardia, and arhythmia prior to acceptance to [facility].  Fannie Knee will let ED MD know this and follow up.  Rona Ravens. [facility] 09/06/2012 11:27 AM  Shuvon Rankin reassessed patient 09/07/12 1725.  There were no changes and did not see where above had been addressed.  Shuvon Rankin FNP-BC 09/07/2012 05:29 PM
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
## 2012-09-06 NOTE — ED Notes (Signed)
Patient resting quietly at this time. Geodon 20 mg held at this time will continue to monitor patient.
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
## 2012-09-06 NOTE — ED Notes (Signed)
One bag in locker 41 

## 2012-09-06 NOTE — ED Notes (Signed)
Refused EKG, will try later

## 2012-09-07 DIAGNOSIS — Z9119 Patient's noncompliance with other medical treatment and regimen: Secondary | ICD-10-CM

## 2012-09-07 DIAGNOSIS — F200 Paranoid schizophrenia: Secondary | ICD-10-CM
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
## 2012-09-07 NOTE — Consult Note (Signed)
Reason for Consult: paranoid psychosis Referring Physician: ED physician  Valerie Howard is an 37 y.o. female.  HPI Patient was seen and chart reviewed. Patient continues to be paranoid, irritable, suspicious, guarded and occasionally loud. Sh was poorly cooperative for obtaining more information. UDS is negative for drug of abuse.   MSE: Patient was facing the wall when talking and abrupt with answer and questions. She continues to be paranoid and guarded at this time.   Past Medical History  Diagnosis Date  . Paranoid behavior     Past Surgical History  Procedure Date  . Cesarean section     Family History  Problem Relation Age of Onset  . Hypertension Other   . Diabetes Other   . Cancer Other     Social History:  reports that she has never smoked. She does not have any smokeless tobacco history on file. She reports that she does not drink alcohol or use illicit drugs.  Allergies:  Allergies  Allergen Reactions  . Penicillins     Medications: I have reviewed the patient's current medications.  Results for orders placed during the hospital encounter of 09/06/12 (from the past 48 hour(s))  CBC     Status: Abnormal   Collection Time   09/06/12 12:54 AM      Component Value Range Comment   WBC 7.5  4.0 - 10.5 K/uL    RBC 4.05  3.87 - 5.11 MIL/uL    Hemoglobin 8.5 (*) 12.0 - 15.0 g/dL    HCT 40.9 (*) 81.1 - 46.0 %    MCV 71.1 (*) 78.0 - 100.0 fL    MCH 21.0 (*) 26.0 - 34.0 pg    MCHC 29.5 (*) 30.0 - 36.0 g/dL    RDW 91.4 (*) 78.2 - 15.5 %    Platelets 451 (*) 150 - 400 K/uL   COMPREHENSIVE METABOLIC PANEL     Status: Abnormal   Collection Time   09/06/12 12:54 AM      Component Value Range Comment   Sodium 134 (*) 135 - 145 mEq/L    Potassium 3.5  3.5 - 5.1 mEq/L    Chloride 99  96 - 112 mEq/L    CO2 24  19 - 32 mEq/L    Glucose, Bld 125 (*) 70 - 99 mg/dL    BUN 16  6 - 23 mg/dL    Creatinine, Ser 9.56  0.50 - 1.10 mg/dL    Calcium 9.4  8.4 - 21.3 mg/dL     Total Protein 7.7  6.0 - 8.3 g/dL    Albumin 3.8  3.5 - 5.2 g/dL    AST 12  0 - 37 U/L    ALT 8  0 - 35 U/L    Alkaline Phosphatase 56  39 - 117 U/L    Total Bilirubin 0.6  0.3 - 1.2 mg/dL    GFR calc non Af Amer 79 (*) >90 mL/min    GFR calc Af Amer >90  >90 mL/min   ETHANOL     Status: Normal   Collection Time   09/06/12 12:54 AM      Component Value Range Comment   Alcohol, Ethyl (B) <11  0 - 11 mg/dL   URINE RAPID DRUG SCREEN (HOSP PERFORMED)     Status: Normal   Collection Time   09/06/12  1:29 AM      Component Value Range Comment   Opiates NONE DETECTED  NONE DETECTED    Cocaine NONE DETECTED  NONE DETECTED    Benzodiazepines NONE DETECTED  NONE DETECTED    Amphetamines NONE DETECTED  NONE DETECTED    Tetrahydrocannabinol NONE DETECTED  NONE DETECTED    Barbiturates NONE DETECTED  NONE DETECTED   POCT PREGNANCY, URINE     Status: Normal   Collection Time   09/06/12  1:31 AM      Component Value Range Comment   Preg Test, Ur NEGATIVE  NEGATIVE     No results found.  Positive for aggressive behavior, bad mood and sleep disturbance Blood pressure 122/80, pulse 100, temperature 99.1 F (37.3 C), temperature source Oral, resp. rate 20, SpO2 100.00%.   Assessment/Plan: Paranoid schizophrenia and non compliant with medication  Recommended inpatient hospitalization for crisis stabilization and medication management. Will start Geodon 20 mg PO/IM BID for psychosis.  Keryl Gholson,JANARDHAHA R. 09/07/2012, 11:14 AM

## 2012-09-07 NOTE — ED Provider Notes (Signed)
Patient seen and evaluated. Patient walking in the hall, states that no one is listening to her. She is re-directable. Was given Ativan yesterday, continue Ativan and/or Geodon if needed. Awaiting placement.  Gilda Crease, MD 09/07/12 [phone]

## 2012-09-07 NOTE — Clinical Social Work Note (Signed)
CSW faxed pt to be reviewed at the following facilities: -Alena Bills, [facility], [place], [place] [phone]  Clinical Social Work

## 2012-09-08 NOTE — ED Notes (Signed)
Pt up to bathroom. Someone is in the bathroom and pt was nice to her stating "not a problem hun". She was very abrupt and accused Clinical research associate of playing the "Garden game" when Clinical research associate tried to assess her. She said that she wouldn't take any medication even if it was scheduled for her because she doesn't need it. She said she's not one of those people that come in trying to get a disability check and she just wants to know when she can leave and go home she's ready to go back to work.

## 2012-09-08 NOTE — ED Notes (Signed)
Resting quietly.  No complaints voiced.

## 2012-09-08 NOTE — ED Notes (Signed)
Nad, watching tv 

## 2012-09-08 NOTE — ED Notes (Signed)
ACT into see 

## 2012-09-08 NOTE — ED Notes (Signed)
Up to the bathroom to shower and change scubs

## 2012-09-08 NOTE — BHH Counselor (Signed)
Patient accepted to [facility] by Verne Spurr, PA to Dr. Jannifer Franklin pending a 400 bed and medical clearance.  Verne Spurr wants Anemia, Arythmia, Tachycardia, and RBC addressed or  plan to address  medical concerns. Verne Spurr, PA spoke to EDP-Dr. Fredderick Phenix regarding concerns. ACT was not informed of details related to patient's plan of care or medical clearance by EDP or Lloyd Huger.  Both providers had gone home for the day.  *F/U with [facility] 09/06/12 and he suggested that Lloyd Huger re-evaluates patient 09/07/12 which is when Lloyd Huger  returns to work.  *09/07/12 it appeared that their was no follow up by Lloyd Huger regarding this patient and no further plan of care for this patient. Their are no notes to indicate otherwise. *This Clinical research associate followed up today 09/08/12 and spoke to Tim in the assessment office. Patient was ran by Alvy Beal, PA and she suggested that [place] follows up with this patient. Tim will contact Lloyd Huger for clarity about this patients disposition and potential plan for inpatient treatment at [facility].

## 2012-09-08 NOTE — ED Notes (Signed)
Offered nicotine patch-stated she did not smoke and didn't understand why [facility] Gainer always wanted to give her one. She was smiling, pleasant.

## 2012-09-09 ENCOUNTER — Encounter (HOSPITAL_COMMUNITY): Payer: Self-pay | Admitting: *Deleted

## 2012-09-09 ENCOUNTER — Inpatient Hospital Stay (HOSPITAL_COMMUNITY)
Admission: AD | Admit: 2012-09-09 | Discharge: 2012-09-14 | DRG: 885 | Disposition: A | Discharge status: Home / Self Care | Payer: Federal, State, Local not specified - Other | Attending: Psychiatry | Admitting: Psychiatry

## 2012-09-09 DIAGNOSIS — Z79899 Other long term (current) drug therapy: Secondary | ICD-10-CM

## 2012-09-09 DIAGNOSIS — F200 Paranoid schizophrenia: Principal | ICD-10-CM | POA: Diagnosis present

## 2012-09-09 MED ORDER — LORAZEPAM 1 MG PO TABS: 1.0000 mg | ORAL_TABLET | Freq: Three times a day (TID) | ORAL | Status: DC | PRN

## 2012-09-09 MED ORDER — ALUM & MAG HYDROXIDE-SIMETH 200-200-20 MG/5ML PO SUSP: 30.0000 mL | ORAL | Status: DC | PRN

## 2012-09-09 MED ORDER — ZIPRASIDONE MESYLATE 20 MG IM SOLR
10.0000 mg | Freq: Once | INTRAMUSCULAR | Status: DC
  Filled 2012-09-09: qty 20

## 2012-09-09 MED ORDER — ACETAMINOPHEN 325 MG PO TABS: 650.0000 mg | ORAL_TABLET | Freq: Four times a day (QID) | ORAL | Status: DC | PRN

## 2012-09-09 MED ORDER — MAGNESIUM HYDROXIDE 400 MG/5ML PO SUSP: 30.0000 mL | Freq: Every day | ORAL | Status: DC | PRN

## 2012-09-09 MED ORDER — ZIPRASIDONE MESYLATE 20 MG IM SOLR: 10.0000 mg | Freq: Once | INTRAMUSCULAR | Status: AC | PRN

## 2012-09-09 MED ORDER — NICOTINE 21 MG/24HR TD PT24
21.0000 mg | MEDICATED_PATCH | Freq: Every day | TRANSDERMAL | Status: DC
  Filled 2012-09-09 (×6): qty 1

## 2012-09-09 MED ORDER — ZIPRASIDONE MESYLATE 20 MG IM SOLR
20.0000 mg | Freq: Once | INTRAMUSCULAR | Status: AC
  Administered 2012-09-09: 20 mg via INTRAMUSCULAR

## 2012-09-09 MED ORDER — ZOLPIDEM TARTRATE 5 MG PO TABS: 5.0000 mg | ORAL_TABLET | Freq: Every evening | ORAL | Status: DC | PRN
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
## 2012-09-09 NOTE — Tx Team (Signed)
Initial Interdisciplinary Treatment Plan  PATIENT STRENGTHS: (choose at least two) Ability for insight Average or above average intelligence Capable of independent living General fund of knowledge  PATIENT STRESSORS: Marital or family conflict Medication change or noncompliance   PROBLEM LIST: Problem List/Patient Goals Date to be addressed Date deferred Reason deferred Estimated date of resolution  psychosis 09/09/12                                                      DISCHARGE CRITERIA:  Ability to meet basic life and health needs Adequate post-discharge living arrangements Improved stabilization in mood, thinking, and/or behavior Verbal commitment to aftercare and medication compliance  PRELIMINARY DISCHARGE PLAN: Attend aftercare/continuing care group Outpatient therapy  PATIENT/FAMIILY INVOLVEMENT: This treatment plan has been presented to and reviewed with the patient, Valerie Howard, and/or family member, .  The patient and family have been given the opportunity to ask questions and make suggestions.  Valerie Howard, [place] 09/09/2012, 5:21 PM

## 2012-09-09 NOTE — ED Notes (Signed)
Pt uncooperative, got very angry because of the coffee on her breakfast tray, pt observed kicking the bedside table multiple times, geodone ordered and administered with assistance of security officers and [employer]. Pt is yelling, cursing and swinging at the security and this Clinical research associate.
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
## 2012-09-09 NOTE — Progress Notes (Signed)
Lloyd Huger PA accepted to Dr. Cyndia Diver at [facility].  Pt going to room 404-1.  Patient will be transported by [employer].  Nurse to call report at [phone]

## 2012-09-09 NOTE — ED Provider Notes (Signed)
When I enter the room the patient Valerie Howard's at me. She is extremely hostile and appears very ambulatory. She states she does not know why she is here. She then told me she did not want to be speak to me any more and I left the room nurses report patient has been uncooperative about taking her medications.  Patient has been accepted at behavior health pending and unavailable room.  Will  order IM Geodon for today.  Ward Givens, MD 09/09/12 [phone]

## 2012-09-09 NOTE — ED Notes (Signed)
Pt is asleep, this Clinical research associate asked the tech not to wake pt up at this time to check VS. Will recheck VS once pt is awake.

## 2012-09-09 NOTE — BHH Counselor (Addendum)
Valerie Howard at [facility] - can't find pt's referral that was faxed this am. They have no beds at this time so he requested writer not resend referral.   Evette Cristal, LCSWA Assessment Counselor

## 2012-09-09 NOTE — ED Notes (Signed)
Pt tot he nurses station asking to use the phone, pt told phone calls will start at 09:00. Pt went back to the room and slammed the door.

## 2012-09-10 ENCOUNTER — Encounter (HOSPITAL_COMMUNITY): Payer: Self-pay | Admitting: Psychiatry

## 2012-09-10 MED ORDER — BENZTROPINE MESYLATE 1 MG PO TABS
1.0000 mg | ORAL_TABLET | Freq: Every day | ORAL | Status: DC
  Filled 2012-09-10 (×5): qty 1

## 2012-09-10 MED ORDER — OLANZAPINE 10 MG PO TBDP
10.0000 mg | ORAL_TABLET | Freq: Three times a day (TID) | ORAL | Status: DC | PRN
  Filled 2012-09-10: qty 42

## 2012-09-10 MED ORDER — OXCARBAZEPINE 150 MG PO TABS
150.0000 mg | ORAL_TABLET | Freq: Two times a day (BID) | ORAL | Status: DC
  Filled 2012-09-10 (×10): qty 1

## 2012-09-10 MED ORDER — RISPERIDONE 2 MG PO TBDP
2.0000 mg | ORAL_TABLET | Freq: Every day | ORAL | Status: DC
  Filled 2012-09-10 (×5): qty 1
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
## 2012-09-10 NOTE — Progress Notes (Signed)
Psychoeducational Group Note  Date:  09/10/2012 Time:  2000  Group Topic/Focus:  Wrap-Up Group:   The focus of this group is to help patients review their daily goal of treatment and discuss progress on daily workbooks.  Participation Level: Did Not Attend  Participation Quality:  Not Applicable  Affect:  Not Applicable  Cognitive:  Not Applicable  Insight:  Not Applicable  Engagement in Group: Not Applicable  Additional Comments:  Pt invited to attend group, but refused and said she would refuse all future groups. Pt rude and dismissive upon approach.  Christ Kick 09/10/2012, 8:39 PM

## 2012-09-10 NOTE — Progress Notes (Signed)
[facility] LCSW Aftercare Discharge Planning Group Note  09/10/2012 4:56 PM  Participation Quality:  Did not attend    [name] 09/10/2012, 4:56 PM

## 2012-09-10 NOTE — Treatment Plan (Signed)
Interdisciplinary Treatment Plan Update (Adult)  Date: 09/10/2012  Time Reviewed: 11:23 AM   Progress in Treatment: Attending groups: No Participating in groups: No Taking medication as prescribed: No Tolerating medication: See above  Family/Significant other contact made:   Patient understands diagnosis:  Yes Discussing patient identified problems/goals with staff:  Yes Medical problems stabilized or resolved:  Yes Denies suicidal/homicidal ideation: Yes Issues/concerns per patient self-inventory:  Yes Other:  New problem(s) identified: N/A  Reason for Continuation of Hospitalization: Delusions  Medication stabilization  Interventions implemented related to continuation of hospitalization:  Risperdal, Trileptal trial  Encourage group attendance and participation  Additional comments: Child psychotherapist to consult with Dr about calling pts spouse without permission Estimated length of stay: 3-4 days  Discharge Plan: unknown  New goal(s): N/A  Review of initial/current patient goals per problem list:   1.  Goal(s): Stabilize mood with medications/therapeutic milieu  Met:  No  Target date:1/17  As evidenced [id number] pressured speech will be diminished and she will exhibit no signs of mood lability  2.  Goal (s): Eliminate paranoia, delusions through the use of medications/therapuetic milieu  Met:  No  Target date:1/17  As evidenced [id number] report/observation  3.  Goal(s): Identify dispositional plan  Met:  No  Target date:1/17  As evidenced by: Vanita Ingles will determine where she will live at d/c, and whether or not she will take medications  4.  Goal(s):  Met:  No  Target date:  As evidenced by:  Attendees: Patient:     Family:     Physician:  Thedore Mins 09/10/2012 11:23 AM   Nursing:  Joslyn Devon  09/10/2012 11:23 AM   Clinical Social Worker:  Richelle Ito 09/10/2012 11:23 AM   Extender:  Verne Spurr PA 09/10/2012 11:23 AM   Other:     Other:       Other:     Other:      Scribe for Treatment Team:   [name], 09/10/2012 11:23 AM
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
## 2012-09-10 NOTE — Progress Notes (Signed)
Pt has been isolated in room all morning. Pt refuses to take meds and attend groups. Pt forwards little information, stating, "we are strangers and I don't tell my business to people".  Pt then stated, nothing in the past has been done so why should I say anything. Pt denies SI/HI/AVH. Pt stated that she knows the system and that it's just a game. Pt will answer all questions before she allow anyone time to ask each questions. Pt has no insight and continues to repeatedly state that she does not belong here.  Medications administered as ordered per MD. Verbal support given. Pt encouraged to attend groups. 15 minute checks performed for safety.  Isolated in room. Minimal interaction. Paranoid thoughts/blaming others.
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
## 2012-09-11 DIAGNOSIS — F200 Paranoid schizophrenia: Secondary | ICD-10-CM

## 2012-09-11 NOTE — Progress Notes (Signed)
[facility] LCSW Aftercare Discharge Planning Group Note  09/11/2012 1:51 PM  Participation Quality:  Invited to attend, politely declined    Valerie Howard 09/11/2012, 1:51 PM

## 2012-09-11 NOTE — Progress Notes (Signed)
Psychoeducational Group Note  Date:  09/11/2012 Time:  2000  Group Topic/Focus:  Wrap-Up Group:   The focus of this group is to help patients review their daily goal of treatment and discuss progress on daily workbooks.  Participation Level: Did Not Attend  Participation Quality:  Not Applicable  Affect:  Not Applicable  Cognitive:  Not Applicable  Insight:  Not Applicable  Engagement in Group: Not Applicable  Additional Comments:  Pt did not attend  Christ Kick 09/11/2012, 9:01 PM

## 2012-09-11 NOTE — Progress Notes (Signed)
Patient ID: Valerie Howard, female   DOB: Feb 15, 1976, 37 y.o.   MRN: [id number]  D;;Writer would not engage the writer in conversation. Became increasingly more agitated with each question the writer asked.   A: Encouragement and support was offered. 15 min checks continued for safety  R:  Pt remains safe.

## 2012-09-11 NOTE — Progress Notes (Signed)
Psychoeducational Group Note  Date:  09/11/2012 Time:  0930   Group Topic/Focus:  Recovery Goals:   The focus of this group is to identify appropriate goals for recovery and establish a plan to achieve them.  Participation Level: Did Not Attend  Participation Quality:  Not Applicable  Affect:  Not Applicable  Cognitive:  Not Applicable  Insight:  Not Applicable  Engagement in Group: Not Applicable  Additional Comments:  Pt refused to attend group. Staff reminded pt that it was group time and pt remained in her room during group  Sharyn Lull 09/11/2012, 10:00 AM

## 2012-09-11 NOTE — Clinical Social Work Note (Signed)
Spoke with husband who confirms that he is not willing to allow her to come back home again.  She has been becoming increasingly paranoid, accusatory, and disorganized.  Last time she was released he took her back because she promised she would take medication and got to follow up appointments.  She did not.   He is not willing to take another chance as this pattern has been going on for years.

## 2012-09-11 NOTE — Progress Notes (Signed)
Pt denies SI/HI/AVH. Pt denies pain and show no s/s of distress. Pt irritable during shift assessment. Pt refuses to take meds. Pt stated, "I'm not crazy". Pt stated that she will sit in her room until she is discharged. Pt stated that she know how the system work. Pt noncompliant w/treatment.   Medications offered as ordered per MD. Verbal support given. Pt encouraged to attend groups. 15 minute checks performed for safety.  Irritable. Isolative.

## 2012-09-11 NOTE — Progress Notes (Signed)
Patient ID: Valerie Howard, female   DOB: [birth year], 37 y.o.   MRN: [id number]  D: Once again pt refused to engage the writer in fluent conversation. Writer attempted to introduce herself and ask the pt about her day. Pt abruptly stated, "When I need something I'll call. Or not call , but come. But I'm all right though".  As she speaks pt's voice and affect show increased agitation.   A: Support and encouragement was offered. 15 min checks continued for safety  R: Pt remains safe.

## 2012-09-11 NOTE — Progress Notes (Signed)
[facility] MD Progress Note  09/11/2012 2:51 PM Valerie Howard  MRN:  [id number] Subjective:  "I'm fine." Met with patient today. She has refused medications, will not attend groups, and continues to isolate herself in her room. She does not feel that she needs to be inpatient. Diagnosis:  Schizophrenia, paranoid type  ADL's:  Intact  Sleep: Good  Appetite:  Good  Suicidal Ideation:  denies Homicidal Ideation:  denies AEB (as evidenced by): Patient's verbal response, affect, and participation in unit programming.  Psychiatric Specialty Exam: Review of Systems  Constitutional: Negative.  Negative for fever, chills, weight loss, malaise/fatigue and diaphoresis.  HENT: Negative for congestion and sore throat.   Eyes: Negative for blurred vision, double vision and photophobia.  Respiratory: Negative for cough, shortness of breath and wheezing.   Cardiovascular: Negative for chest pain, palpitations and PND.  Gastrointestinal: Negative for heartburn, nausea, vomiting, abdominal pain, diarrhea and constipation.  Musculoskeletal: Negative for myalgias, joint pain and falls.  Neurological: Negative for dizziness, tingling, tremors, sensory change, speech change, focal weakness, seizures, loss of consciousness, weakness and headaches.  Endo/Heme/Allergies: Negative for polydipsia. Does not bruise/bleed easily.  Psychiatric/Behavioral: Negative for depression, suicidal ideas, hallucinations, memory loss and substance abuse. The patient is not nervous/anxious and does not have insomnia.     Blood pressure 129/94, pulse 120, temperature 98.5 F (36.9 C), temperature source Oral, resp. rate 20, height 5\' 6"  (1.676 m), weight 63.957 kg (141 lb).Body mass index is 22.76 kg/(m^2).  General Appearance: Bizarre wild eyed stare  Eye Contact::  Minimal  Speech:  curt, short and rude  Volume:  Normal  Mood:  Angry, Irritable and dismissive   Affect:  Labile  Thought Process:  Goal Directed and  Linear  Orientation:  Full (Time, Place, and Person)  Thought Content:  Negative  Suicidal Thoughts:  No  Homicidal Thoughts:  No  Memory:  NA  Judgement:  Impaired  Insight:  Lacking  Psychomotor Activity:  Normal  Concentration:  NA  Recall:  Good  Akathisia:  No  Handed:  Right  AIMS (if indicated):     Assets:  Housing Physical Health Resilience  Sleep:  Number of Hours: 3.5    Current Medications: Current Facility-Administered Medications  Medication Dose Route Frequency Provider Last Rate Last Dose  . acetaminophen (TYLENOL) tablet 650 mg  650 mg Oral Q6H PRN Shuvon Rankin, NP      . alum & mag hydroxide-simeth (MAALOX/MYLANTA) 200-200-20 MG/5ML suspension 30 mL  30 mL Oral Q4H PRN Shuvon Rankin, NP      . benztropine (COGENTIN) tablet 1 mg  1 mg Oral QHS Mojeed Akintayo      . LORazepam (ATIVAN) tablet 1 mg  1 mg Oral Q8H PRN Shuvon Rankin, NP      . magnesium hydroxide (MILK OF MAGNESIA) suspension 30 mL  30 mL Oral Daily PRN Shuvon Rankin, NP      . nicotine (NICODERM CQ - dosed in mg/24 hours) patch 21 mg  21 mg Transdermal Daily Shuvon Rankin, NP      . OLANZapine zydis (ZYPREXA) disintegrating tablet 10 mg  10 mg Oral Q8H PRN Mojeed Akintayo      . OXcarbazepine (TRILEPTAL) tablet 150 mg  150 mg Oral BID Mojeed Akintayo      . risperiDONE (RISPERDAL M-TABS) disintegrating tablet 2 mg  2 mg Oral QHS Mojeed Akintayo      . zolpidem (AMBIEN) tablet 5 mg  5 mg Oral QHS PRN Assunta Found, NP  Lab Results: No results found for this or any previous visit (from the past 48 hour(s)).  Physical Findings: AIMS: Facial and Oral Movements Muscles of Facial Expression: None, normal Lips and Perioral Area: None, normal Jaw: None, normal Tongue: None, normal,Extremity Movements Upper (arms, wrists, hands, fingers): None, normal Lower (legs, knees, ankles, toes): None, normal, Trunk Movements Neck, shoulders, hips: None, normal, Overall Severity Severity of abnormal  movements (highest score from questions above): None, normal Incapacitation due to abnormal movements: None, normal Patient's awareness of abnormal movements (rate only patient's report): No Awareness, Dental Status Current problems with teeth and/or dentures?: No Does patient usually wear dentures?: No  CIWA:    COWS:     Treatment Plan Summary: Daily contact with patient to assess and evaluate symptoms and progress in treatment Medication management  Plan: 1. Will continue to encourage the patient to consider medication. 2. Will continue to offer supportive care and attempt to develop a therapeutic relationship with this patient.  Medical Decision Making Problem Points:  Established problem, stable/improving (1) and New problem, with no additional work-up planned (3) Data Points:  Review or order medicine tests (1)  I certify that inpatient services furnished can reasonably be expected to improve the patient's condition.   Rona Ravens. Berish Bohman PAC 09/11/2012, 2:51 PM

## 2012-09-12 NOTE — Progress Notes (Signed)
Psychoeducational Group Note  Date:  09/12/2012 Time:  2000  Group Topic/Focus:  Wrap-Up Group:   The focus of this group is to help patients review their daily goal of treatment and discuss progress on daily workbooks.  Participation Level: Did Not Attend  Participation Quality:  Not Applicable  Affect:  Not Applicable  Cognitive:  Not Applicable  Insight:  Not Applicable  Engagement in Group: Not Applicable  Additional Comments: Pt chose not to attend  Christ Kick 09/12/2012, 9:46 PM

## 2012-09-12 NOTE — Progress Notes (Signed)
Psychoeducational Group Note  Date:  09/12/2012 Time:  1100  Group Topic/Focus:  Self Care:   The focus of this group is to help patients understand the importance of self-care in order to improve or restore emotional, physical, spiritual, interpersonal, and financial health.  Participation Level: Did Not Attend  Participation Quality:  Not Applicable  Affect:  Not Applicable  Cognitive:  Not Applicable  Insight:  Not Applicable  Engagement in Group: Not Applicable  Additional Comments:  Pt remained lying in the bed during group and refused to attend  Sharyn Lull 09/12/2012, 5:21 PM

## 2012-09-12 NOTE — Progress Notes (Signed)
D: Patient declines self inventory; Patient denies SI/HI and A/V hallucinations  A: Monitored q 15 minutes; patient encouraged to attend groups; patient educated about medications; patient given medications per physician orders; patient encouraged to express feelings and/or concerns  R: Patient is isolative, defiant, and can be very aggressive verbally; patient's interaction with staff and peers is very minimal and only interacts when prompted;  patient is refusing all medications ; patient is not attending all groups

## 2012-09-12 NOTE — Progress Notes (Signed)
D: Pt continues to refuse scheduled medications;Pt  Did not attend group this PM . Pt goes to the cafeteria for meals.A: Pt was encouraged to be medication compliant & encouraged to attend groups.Continues on 15 minute checks.Supported & encouraged.R: Pt continues to be defiant & continues to refuse groups & medications. Tends to isolate.Pt. Safety maintained.

## 2012-09-12 NOTE — Progress Notes (Signed)
[facility] MD Progress Note  09/12/2012 10:29 AM Valerie Howard  MRN:  [id number] Subjective:  "Paranoid schizophrenia" Met with the patient in treatment team to discuss her care and response to treatment. She is refusing medication, will not participate, continues to deny any need for care. Diagnosis:  Schizophrenia, paranoid type  ADL's:  Intact  Sleep: Fair  Appetite:  Fair  Suicidal Ideation:  denies Homicidal Ideation:  denies AEB (as evidenced by):patient's affect, verbal response when asked, and participation in unit programming. The patient continues to refuse to attend groups. Psychiatric Specialty Exam: Review of Systems  Unable to perform ROS: mental acuity    Blood pressure 144/93, pulse 131, temperature 97.9 F (36.6 C), temperature source Oral, resp. rate 14, height 5\' 6"  (1.676 m), weight 63.957 kg (141 lb).Body mass index is 22.76 kg/(m^2).  General Appearance: Disheveled  Eye Contact::  Minimal  Speech:  goal directed  Volume:  Increased  Mood:  Angry and hostile, oppositional, dismissive  Affect:  Labile  Thought Process:  Loose  Orientation:  Other:  unasked  Thought Content:  Delusions  Suicidal Thoughts:  No  Homicidal Thoughts:  No  Memory:  NA  Judgement:  Impaired  Insight:  Lacking  Psychomotor Activity:  Normal  Concentration:  Poor  Recall:  Poor  Akathisia:  No  Handed:    AIMS (if indicated):     Assets:  Vocational/Educational  Sleep:  Number of Hours: 4.5    Current Medications: Current Facility-Administered Medications  Medication Dose Route Frequency Provider Last Rate Last Dose  . acetaminophen (TYLENOL) tablet 650 mg  650 mg Oral Q6H PRN Shuvon Rankin, NP      . alum & mag hydroxide-simeth (MAALOX/MYLANTA) 200-200-20 MG/5ML suspension 30 mL  30 mL Oral Q4H PRN Shuvon Rankin, NP      . benztropine (COGENTIN) tablet 1 mg  1 mg Oral QHS Mojeed Akintayo      . LORazepam (ATIVAN) tablet 1 mg  1 mg Oral Q8H PRN Shuvon Rankin, NP        . magnesium hydroxide (MILK OF MAGNESIA) suspension 30 mL  30 mL Oral Daily PRN Shuvon Rankin, NP      . nicotine (NICODERM CQ - dosed in mg/24 hours) patch 21 mg  21 mg Transdermal Daily Shuvon Rankin, NP      . OLANZapine zydis (ZYPREXA) disintegrating tablet 10 mg  10 mg Oral Q8H PRN Mojeed Akintayo      . OXcarbazepine (TRILEPTAL) tablet 150 mg  150 mg Oral BID Mojeed Akintayo      . risperiDONE (RISPERDAL M-TABS) disintegrating tablet 2 mg  2 mg Oral QHS Mojeed Akintayo        Lab Results: No results found for this or any previous visit (from the past 48 hour(s)).  Physical Findings: AIMS: Facial and Oral Movements Muscles of Facial Expression: None, normal Lips and Perioral Area: None, normal Jaw: None, normal Tongue: None, normal,Extremity Movements Upper (arms, wrists, hands, fingers): None, normal Lower (legs, knees, ankles, toes): None, normal, Trunk Movements Neck, shoulders, hips: None, normal, Overall Severity Severity of abnormal movements (highest score from questions above): None, normal Incapacitation due to abnormal movements: None, normal Patient's awareness of abnormal movements (rate only patient's report): No Awareness, Dental Status Current problems with teeth and/or dentures?: No Does patient usually wear dentures?: No  CIWA:    COWS:     Treatment Plan Summary: Daily contact with patient to assess and evaluate symptoms and progress in treatment Medication management  Assessment: While the patient continues to be uncooperative by refusing medication and being rude to staff and other patients, she demonstrates poor judgement with no insight.  Valerie Howard has not displayed any aggression or violent behaviors. She continues to be isolative to her room. Plan: 1. Patient will notify us of where she intends to go upon her discharge. Valerie Howard is not participating in any therapy, unit programming, or medication management, there is little to no chance for improvement unless she  participates even on a minimal level.  Discharge is recommended for tomorrow.   Medical Decision Making Problem Points:  Established problem, worsening (2) and Review of psycho-social stressors (1) Data Points:  Review or order medicine tests (1)  I certify that inpatient services furnished can reasonably be expected to improve the patient's condition.  Rona Ravens. Valerie Howard PAC 09/12/2012, 10:29 AM

## 2012-09-12 NOTE — Progress Notes (Signed)
[facility] LCSW Aftercare Discharge Planning Group Note  09/12/2012 9:13 AM  Participation Quality:  Found patient in chair in room looking out the window.  Pleasant.  Invited her to group per usual.  She politely declined.    Daryel Gerald B 09/12/2012, 9:13 AM

## 2012-09-12 NOTE — Progress Notes (Signed)
[facility] Group Notes:  (Counselor/Nursing/MHT/Case Management/Adjunct)  07/13/2012 12:00 PM  Type of Therapy:  Group Therapy  Participation Level:  Did Not Attend  Smart, Heather N 07/13/2012, 12:00 PM  

## 2012-09-13 NOTE — Progress Notes (Signed)
Psychoeducational Group Note  Date:  09/13/2012 Time: 2000 Group Topic/Focus:  Karaoke group  Participation Level: Did Not Attend  Participation Quality:  Not Applicable  Affect:  Not Applicable  Cognitive:  Not Applicable  Insight:  Not Applicable  Engagement in Group: Not Applicable  Additional Comments:    Valerie Howard 09/13/2012, 10:24 PM

## 2012-09-13 NOTE — Progress Notes (Signed)
D:  Patient has remained in her room most of the day.  She declined medications this morning.  She then asked to speak with me in front of another staff member.  She proceeded to let us know that she does not feel there is anything wrong with her and it is her right not to take medication if she does not feel she needs it.  She stated she did not want to appear rude or disrespectful. A:  Brought medications to patients room this morning prior to the above conversation.  I assured her that we were not going to force her to take medication at this time, but that I was required to offer medications if they were scheduled.  I thanked her for coming forward and for being honest with staff, and let her know that staff were here to help her if she chose to accept it.   R:  Patient defiant, but cooperative with staff.  No interaction with peers.  Continues to refuse medications.

## 2012-09-13 NOTE — Progress Notes (Signed)
[facility] LCSW Aftercare Discharge Planning Group Note  09/13/2012 9:31 AM  Participation Quality:  Nodarse was invited to jopin [place] in group.  she politely declined.    Daryel Gerald B 09/13/2012, 9:31 AM

## 2012-09-13 NOTE — Progress Notes (Signed)
[facility] Group Notes:  (Counselor/Nursing/MHT/Case Management/Adjunct)  09/13/2012 11:54 AM   Type of Therapy:  Group Therapy  Participation Level:  Did Not Attend  Smart, Ledell Peoples 07/12/2012, 2:34 PM

## 2012-09-13 NOTE — Progress Notes (Signed)
[facility] Group Notes:  (Nursing/MHT/Case Management/Adjunct)  Date:  09/13/2012  Time:  2:20 PM  Type of Therapy:  Psychoeducational Skills  Participation Level:  Did Not Attend  Summary of Progress/Problems: Patient become upset when told that group was starting (raised voiced, rolled eyes etc.) and left dayroom/group room.  Wandra Scot 09/13/2012, 2:20 PM

## 2012-09-13 NOTE — Progress Notes (Signed)
Patient ID: Valerie Howard, female   DOB: 09/17/1975, 37 y.o.   MRN: [id number] [facility] MD Progress Note  09/13/2012 9:30 AM Valerie Howard  MRN:  [id number] Subjective:  Patient remains paranoid and angry with her husband. She denies auditory/visual hallucinations, suicidal ideations, intent or plan. She states that she is sleeping well and has so far refused to take the medications prescribed for her and will not participate in any of the units activities. Patient remains guarded, isolated and socially withdrawn from her peers. However, patient is not aggressive towards the staffs or her peers. Diagnosis:  Schizophrenia, paranoid type  ADL's:  Intact  Sleep: Fair  Appetite:  Fair  Suicidal Ideation:  denies Homicidal Ideation:  denies AEB (as evidenced by):patient's affect, verbal response when asked, and participation in unit programming. The patient continues to refuse to attend groups. Psychiatric Specialty Exam: Review of Systems  Unable to perform ROS: mental acuity  Constitutional: Negative.   HENT: Negative.   Eyes: Negative.   Respiratory: Negative.   Cardiovascular: Negative.   Gastrointestinal: Negative.   Genitourinary: Negative.   Musculoskeletal: Negative.   Skin: Negative.   Neurological: Negative.   Endo/Heme/Allergies: Negative.   Psychiatric/Behavioral: Negative.     Blood pressure 145/96, pulse 115, temperature 97.8 F (36.6 C), temperature source Oral, resp. rate 18, height 5\' 6"  (1.676 m), weight 63.957 kg (141 lb).Body mass index is 22.76 kg/(m^2).  General Appearance: casual  Eye Contact::  Minimal  Speech:  goal directed  Volume:  Increased  Mood:  irritable  Affect:  Labile  Thought Process:  Loose  Orientation:  To time, place and person  Thought Content:  Delusions  Suicidal Thoughts:  No  Homicidal Thoughts:  No  Memory:  NA  Judgement:  Impaired  Insight:  Lacking  Psychomotor Activity:  Normal  Concentration:  Poor    Recall:  Poor  Akathisia:  No  Handed:    AIMS (if indicated):     Assets:  Vocational/Educational  Sleep:  Number of Hours: 4    Current Medications: Current Facility-Administered Medications  Medication Dose Route Frequency Provider Last Rate Last Dose  . acetaminophen (TYLENOL) tablet 650 mg  650 mg Oral Q6H PRN Shuvon Rankin, NP      . alum & mag hydroxide-simeth (MAALOX/MYLANTA) 200-200-20 MG/5ML suspension 30 mL  30 mL Oral Q4H PRN Shuvon Rankin, NP      . benztropine (COGENTIN) tablet 1 mg  1 mg Oral QHS Brandalynn Ofallon      . LORazepam (ATIVAN) tablet 1 mg  1 mg Oral Q8H PRN Shuvon Rankin, NP      . magnesium hydroxide (MILK OF MAGNESIA) suspension 30 mL  30 mL Oral Daily PRN Shuvon Rankin, NP      . nicotine (NICODERM CQ - dosed in mg/24 hours) patch 21 mg  21 mg Transdermal Daily Shuvon Rankin, NP      . OLANZapine zydis (ZYPREXA) disintegrating tablet 10 mg  10 mg Oral Q8H PRN Lavell Supple      . OXcarbazepine (TRILEPTAL) tablet 150 mg  150 mg Oral BID Mireille Lacombe      . risperiDONE (RISPERDAL M-TABS) disintegrating tablet 2 mg  2 mg Oral QHS Isaid Salvia        Lab Results: No results found for this or any previous visit (from the past 48 hour(s)).  Physical Findings: AIMS: Facial and Oral Movements Muscles of Facial Expression: None, normal Lips and Perioral Area: None, normal Jaw: None, normal Tongue:  None, normal,Extremity Movements Upper (arms, wrists, hands, fingers): None, normal Lower (legs, knees, ankles, toes): None, normal, Trunk Movements Neck, shoulders, hips: None, normal, Overall Severity Severity of abnormal movements (highest score from questions above): None, normal Incapacitation due to abnormal movements: None, normal Patient's awareness of abnormal movements (rate only patient's report): No Awareness, Dental Status Current problems with teeth and/or dentures?: No Does patient usually wear dentures?: No  CIWA:    COWS:     Treatment  Plan Summary: Daily contact with patient to assess and evaluate symptoms and progress in treatment Medication management  Plan: 1. Patient encouraged to take her medications and participates in unit milieu. 2. Possible discharge tomorrow.  Medical Decision Making Problem Points:  Established problem, worsening (2) and Review of psycho-social stressors (1) Data Points:  Review or order medicine tests (1)  I certify that inpatient services furnished can reasonably be expected to improve the patient's condition.  Thedore Mins, MD 09/13/2012, 9:30 AM

## 2012-09-13 NOTE — Progress Notes (Signed)
Pt was laying in bed when the writer went to assess. Writer introduced self and informed pt that Clinical research associate would be her nurse for the evening. Writer asked pt about her day. Pt was agitated and informed the writer that she didn't need anything, and that she would call if she did. Writer attempted to assure the pt that she was concerned about her day. Pt stated that she was to be discharged possibly tomorrow. Writer believing that the pt was hers asked the pt what day, because it had not been discussed in report.  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 Pt stated, "stop wasting time with me and go talk to those people out there that need you, because I don't need to be here". Writer informed the pt that if she was here it must be for a reason and everybody has their own issues. Pt stated, "you don't know my story". Pt approached the writer apprx 2 min later and asked for the writer's last name. Writer informed pt that last names are not given, but informed I was the only Valerie Howard on the unit.

## 2012-09-13 NOTE — Progress Notes (Signed)
Psychoeducational Group Note  Date:  09/13/2012 Time:  0930  Group Topic/Focus:  Self Esteem Action Plan:   The focus of this group is to help patients create a plan to continue to build self-esteem after discharge.  Participation Level: Did Not Attend  Participation Quality:  Not Applicable  Affect:  Not Applicable  Cognitive:  Not Applicable  Insight:  Not Applicable  Engagement in Group: Not Applicable  Additional Comments:  Pt refused to attend group this morning.  Nairi Oswald E 09/13/2012, 1:29 PM

## 2012-09-14 DIAGNOSIS — F200 Paranoid schizophrenia: Principal | ICD-10-CM

## 2012-09-14 MED ORDER — OLANZAPINE 10 MG PO TBDP: 10.0000 mg | ORAL_TABLET | Freq: Three times a day (TID) | ORAL | Status: DC | PRN

## 2012-09-14 MED ORDER — BENZTROPINE MESYLATE 1 MG PO TABS: 1.0000 mg | ORAL_TABLET | Freq: Every day | ORAL | Status: DC

## 2012-09-14 MED ORDER — RISPERIDONE 2 MG PO TBDP: 2.0000 mg | ORAL_TABLET | Freq: Every day | ORAL | Status: DC

## 2012-09-14 MED ORDER — OXCARBAZEPINE 150 MG PO TABS: 150.0000 mg | ORAL_TABLET | Freq: Two times a day (BID) | ORAL | Status: DC

## 2012-09-14 NOTE — Progress Notes (Signed)
Adult Psychoeducational Group Note  Date:  09/14/2012 Time:  930  Group Topic/Focus:  Relapse Prevention Planning:   The focus of this group is to define relapse and discuss the need for planning to combat relapse.  Participation Level:  Did Not Attend  Nile Dear 09/14/2012, 10:30 AM

## 2012-09-14 NOTE — Progress Notes (Addendum)
Patient ID: Valerie Howard, female   DOB: 04-Apr-1976, 37 y.o.   MRN: [id number]  D: Pt denies SI/HI/AVH. Pt denies all medication. Pt remained in room most of the night, with minimal communication and interaction. Pt is very argumentative and defensive. Pt Bp was 176/101 on the dynamap, Manual check was 160/90.   A: Pt was offered support and encouragement. Pt was encourage to attend groups. Q 15 minute checks were done for safety.    R: Pt is not  taking medication.  safety maintained on unit.
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
## 2012-09-14 NOTE — Discharge Summary (Signed)
Physician Discharge Summary Note  Patient:  Valerie Howard is an 37 y.o., female MRN:  [id number] DOB:  10/15/1975 Patient phone:  [phone] (home)  Patient address:   [address] [place] [address],   Date of Admission:  09/09/2012 Date of Discharge: 09/14/2012  Reason for Admission:  Paranoia, Schizophrenia  Discharge Diagnoses: Principal Problem:  *Schizophrenia, paranoid type  Review of Systems  Constitutional: Negative.   HENT: Negative.   Eyes: Negative.   Respiratory: Negative.   Cardiovascular: Negative.   Gastrointestinal: Negative.   Genitourinary: Negative.   Musculoskeletal: Negative.   Skin: Negative.   Neurological: Negative.   Endo/Heme/Allergies: Negative.   Psychiatric/Behavioral: Negative.    Axis Diagnosis:   AXIS I:  Chronic Paranoid Schizophrenia AXIS II:  Deferred AXIS III:   Past Medical History  Diagnosis Date  . Paranoid behavior    AXIS IV:  economic problems, educational problems, housing problems, other psychosocial or environmental problems, problems related to social environment and problems with primary support group AXIS V:  51-60 moderate symptoms  Level of Care:  OP  Hospital Course:  Review of chart, vital signs, medications, and notes. 1-Individual and group therapy encouraged but patient refused 2-Medication management for Schizophrenia/paranoia ordered and encouraged but patient refused--14 day supply of medications and Rx given at discharge and patient encouraged to take them 3-Coping skills for psychosis attempted but patient refused 4-Crisis stabilization and management completed 5-Treatment plan in place for care but patient refusing everything at this time 6-Patient denied suicidal/homicidal ideations and auditory/visual hallucinations, follow-up appointments encouraged to attend despite patient refusals  Consults:  None  Significant Diagnostic Studies:  labs: Completed and reviewed,  stable  Discharge Vitals:   Blood pressure 122/90, pulse 106, temperature 98 F (36.7 C), temperature source Oral, resp. rate 18, height 5\' 6"  (1.676 m), weight 63.957 kg (141 lb). Body mass index is 22.76 kg/(m^2). Lab Results:   No results found for this or any previous visit (from the past 72 hour(s)).  Physical Findings: AIMS: Facial and Oral Movements Muscles of Facial Expression: None, normal Lips and Perioral Area: None, normal Jaw: None, normal Tongue: None, normal,Extremity Movements Upper (arms, wrists, hands, fingers): None, normal Lower (legs, knees, ankles, toes): None, normal, Trunk Movements Neck, shoulders, hips: None, normal, Overall Severity Severity of abnormal movements (highest score from questions above): None, normal Incapacitation due to abnormal movements: None, normal Patient's awareness of abnormal movements (rate only patient's report): No Awareness, Dental Status Current problems with teeth and/or dentures?: No Does patient usually wear dentures?: No  CIWA:    COWS:     Psychiatric Specialty Exam: See Psychiatric Specialty Exam and Suicide Risk Assessment completed by Attending Physician prior to discharge.  Discharge destination:  Home  Is patient on multiple antipsychotic therapies at discharge:  Yes,   Do you recommend tapering to monotherapy for antipsychotics?  No   Has Patient had three or more failed trials of antipsychotic monotherapy by history:  No Recommended Plan for Multiple Antipsychotic Therapies:  Patient is ordered medications but refuses them  Discharge Orders    Future Orders Please Complete By Expires   Diet - low sodium heart healthy      Activity as tolerated - No restrictions          Medication List     As of 09/14/2012  9:39 AM    TAKE these medications      Indication    benztropine 1 MG tablet   Commonly known as:  COGENTIN   Take 1 tablet (1 mg total) by mouth at bedtime.    Indication: Extrapyramidal Reaction  caused by Medications      OLANZapine zydis 10 MG disintegrating tablet   Commonly known as: ZYPREXA   Take 1 tablet (10 mg total) by mouth every 8 (eight) hours as needed (agitation).       OXcarbazepine 150 MG tablet   Commonly known as: TRILEPTAL   Take 1 tablet (150 mg total) by mouth 2 (two) times daily.    Indication: Mood disorder      risperiDONE 2 MG disintegrating tablet   Commonly known as: RISPERDAL M-TABS   Take 1 tablet (2 mg total) by mouth at bedtime.    Indication: Schizophrenia        Follow-up recommendations:  Activity as tolerated, low-sodium heart healthy diet  Comments:  Patient encouraged to go to follow-up appointment but patient refuses at this time  Total Discharge Time:  Greater than 30 minutes  Signed: Nanine Means, PMH-NP 09/14/2012, 9:39 AM

## 2012-09-14 NOTE — Progress Notes (Signed)
[facility] Adult Case Management Discharge Plan :  Will you be returning to the same living situation after discharge: No. At discharge, do you have transportation home?:Yes,  given bus pass Do you have the ability to pay for your medications:Yes,  refusing to take any meds  Release of information consent forms completed and in the chart;  Patient's signature needed at discharge.  Patient to Follow up at: Follow-up Information    Follow up with [facility]. (Go to the walk-in clinic Mon-Fri between 8 and 9 AM for your hospital follow up appointment)    Contact information:   [address] [place]  [phone]         Patient denies SI/HI:   Yes,  yes    Safety Planning and Suicide Prevention discussed:  Yes,  yes  Valerie Howard 09/14/2012, 11:54 AM

## 2012-09-14 NOTE — Progress Notes (Signed)
Patient ID: Valerie Howard, female   DOB: Sep 30, 1975, 37 y.o.   MRN: [id number] Patient denies SI/HI and A/V hallucinations; patient discharged per physician order; patient refused to sign AVS and refused to have discharged instructions explained; patient refused samples an they have been returned to pharmacy and patient refused to take prescriptions after being asked more than once; patient received her bus pass and she did take the pass; physician has been made aware of the fact that the patient refused everything; patient had no other concerns or questions at this time; patient voiced no concerns about her belongings; patient left the unit ambulatory; patient was escorted to the front lobby

## 2012-09-18 NOTE — Progress Notes (Signed)
Patient Discharge Instructions:  Patient refused to sign the ROI.  No documentation was sent to [facility].  Jerelene Redden, 09/18/2012, 3:36 PM

## 2012-09-23 ENCOUNTER — Other Ambulatory Visit: Payer: Self-pay

## 2012-09-23 ENCOUNTER — Emergency Department (HOSPITAL_COMMUNITY)
Admission: EM | Admit: 2012-09-23 | Discharge: 2012-09-25 | Disposition: A | Discharge status: Other Acute Inpatient Hospital | Payer: Self-pay | Attending: Emergency Medicine | Admitting: Emergency Medicine

## 2012-09-23 ENCOUNTER — Encounter (HOSPITAL_COMMUNITY): Payer: Self-pay | Admitting: *Deleted

## 2012-09-23 DIAGNOSIS — F911 Conduct disorder, childhood-onset type: Secondary | ICD-10-CM | POA: Insufficient documentation

## 2012-09-23 DIAGNOSIS — R000 Tachycardia, unspecified: Secondary | ICD-10-CM

## 2012-09-23 DIAGNOSIS — Z8659 Personal history of other mental and behavioral disorders: Secondary | ICD-10-CM | POA: Insufficient documentation

## 2012-09-23 DIAGNOSIS — F200 Paranoid schizophrenia: Secondary | ICD-10-CM | POA: Insufficient documentation

## 2012-09-23 DIAGNOSIS — R451 Restlessness and agitation: Secondary | ICD-10-CM

## 2012-09-23 DIAGNOSIS — Z3202 Encounter for pregnancy test, result negative: Secondary | ICD-10-CM | POA: Insufficient documentation

## 2012-09-23 DIAGNOSIS — F411 Generalized anxiety disorder: Secondary | ICD-10-CM | POA: Insufficient documentation

## 2012-09-23 DIAGNOSIS — Z008 Encounter for other general examination: Secondary | ICD-10-CM | POA: Insufficient documentation

## 2012-09-23 LAB — COMPREHENSIVE METABOLIC PANEL
Alkaline Phosphatase: 62 U/L (ref 39–117)
BUN: 13 mg/dL (ref 6–23)
Chloride: 101 mEq/L (ref 96–112)
GFR calc Af Amer: >90 mL/min (ref >90)
Glucose, Bld: 97 mg/dL (ref 70–99)
Potassium: 3.8 mEq/L (ref 3.5–5.1)
Total Bilirubin: 0.4 mg/dL (ref 0.3–1.2)
Total Protein: 7.9 g/dL (ref 6.0–8.3)

## 2012-09-23 LAB — CBC
MCH: 21.1 pg — ABNORMAL LOW (ref 26.0–34.0)
Platelets: 551 10*3/uL — ABNORMAL HIGH (ref 150–400)
RBC: 4.03 MIL/uL (ref 3.87–5.11)

## 2012-09-23 LAB — RAPID URINE DRUG SCREEN, HOSP PERFORMED
Amphetamines: NOT DETECTED
Opiates: NOT DETECTED

## 2012-09-23 LAB — ETHANOL: Alcohol, Ethyl (B): <11 mg/dL (ref 0–11)

## 2012-09-23 MED ORDER — LORAZEPAM 1 MG PO TABS: 2.0000 mg | ORAL_TABLET | Freq: Four times a day (QID) | ORAL | Status: DC | PRN

## 2012-09-23 MED ORDER — BENZTROPINE MESYLATE 1 MG PO TABS
1.0000 mg | ORAL_TABLET | Freq: Every day | ORAL | Status: DC
  Filled 2012-09-23: qty 1

## 2012-09-23 MED ORDER — RISPERIDONE 2 MG PO TBDP
2.0000 mg | ORAL_TABLET | Freq: Every day | ORAL | Status: DC
  Filled 2012-09-23 (×3): qty 1

## 2012-09-23 MED ORDER — OLANZAPINE 10 MG PO TBDP: 10.0000 mg | ORAL_TABLET | Freq: Three times a day (TID) | ORAL | Status: DC | PRN

## 2012-09-23 MED ORDER — ZIPRASIDONE MESYLATE 20 MG IM SOLR
10.0000 mg | Freq: Once | INTRAMUSCULAR | Status: AC
  Administered 2012-09-23: 10 mg via INTRAMUSCULAR
  Filled 2012-09-23: qty 20

## 2012-09-23 MED ORDER — OXCARBAZEPINE 150 MG PO TABS
150.0000 mg | ORAL_TABLET | Freq: Two times a day (BID) | ORAL | Status: DC
  Filled 2012-09-23 (×2): qty 1

## 2012-09-23 MED ORDER — NICOTINE 21 MG/24HR TD PT24: 21.0000 mg | MEDICATED_PATCH | Freq: Every day | TRANSDERMAL | Status: DC

## 2012-09-23 MED ORDER — ALUM & MAG HYDROXIDE-SIMETH 200-200-20 MG/5ML PO SUSP: 30.0000 mL | ORAL | Status: DC | PRN

## 2012-09-23 MED ORDER — ZIPRASIDONE MESYLATE 20 MG IM SOLR
20.0000 mg | Freq: Two times a day (BID) | INTRAMUSCULAR | Status: DC | PRN
  Filled 2012-09-23: qty 20

## 2012-09-23 MED ORDER — ACETAMINOPHEN 325 MG PO TABS: 650.0000 mg | ORAL_TABLET | ORAL | Status: DC | PRN

## 2012-09-23 MED ORDER — ONDANSETRON HCL 4 MG PO TABS: 4.0000 mg | ORAL_TABLET | Freq: Three times a day (TID) | ORAL | Status: DC | PRN

## 2012-09-23 MED ORDER — IBUPROFEN 600 MG PO TABS: 600.0000 mg | ORAL_TABLET | Freq: Three times a day (TID) | ORAL | Status: DC | PRN

## 2012-09-23 MED ORDER — OXCARBAZEPINE 300 MG PO TABS
300.0000 mg | ORAL_TABLET | Freq: Two times a day (BID) | ORAL | Status: DC
  Filled 2012-09-23 (×5): qty 1

## 2012-09-23 NOTE — ED Notes (Signed)
Watching tv
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
## 2012-09-23 NOTE — ED Notes (Signed)
Pt brought back to psych ed to room 42 by tech from triage.
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
## 2012-09-23 NOTE — ED Provider Notes (Signed)
History     CSN: [id number]  Arrival date & time 09/23/12  0044   First MD Initiated Contact with Patient 09/23/12 0116      Chief Complaint  Patient presents with  . Medical Clearance    (Consider location/radiation/quality/duration/timing/severity/associated sxs/prior treatment) HPI  Patient IVC'd presents to the ED from [facility] for her pulse being elevated at 103. The patient is agitated by her circumstances but is asymptomatic. Denies chest pains, SOB, or any medical concerns. The patient during questioning started to become hostile so i discontinued asking her questions.  [place] count sheriff brought her in. Unable to obtain anymore information from patient. Pt refuses to answer questions  Past Medical History  Diagnosis Date  . Paranoid behavior     Past Surgical History  Procedure Date  . Cesarean section     Family History  Problem Relation Age of Onset  . Hypertension Other   . Diabetes Other   . Cancer Other     History  Substance Use Topics  . Smoking status: Never Smoker   . Smokeless tobacco: Not on file  . Alcohol Use: No    OB History    Grav Para Term Preterm Abortions TAB SAB Ect Mult Living                  Review of Systems  Unable to perform ROS   Allergies  Penicillins  Home Medications   Current Outpatient Rx  Name  Route  Sig  Dispense  Refill  . BENZTROPINE MESYLATE 1 MG PO TABS   Oral   Take 1 tablet (1 mg total) by mouth at bedtime.   30 tablet   0   . OLANZAPINE 10 MG PO TBDP   Oral   Take 1 tablet (10 mg total) by mouth every 8 (eight) hours as needed (agitation).   30 tablet   0   . OXCARBAZEPINE 150 MG PO TABS   Oral   Take 1 tablet (150 mg total) by mouth 2 (two) times daily.   60 tablet   0   . RISPERIDONE 2 MG PO TBDP   Oral   Take 1 tablet (2 mg total) by mouth at bedtime.   30 tablet   0     BP 131/87  Pulse 104  Temp 98.6 F (37 C) (Oral)  SpO2 100%  LMP 09/15/2012  Physical Exam    Nursing note and vitals reviewed. Constitutional: She appears well-developed and well-nourished. No distress.  HENT:  Head: Normocephalic and atraumatic.  Eyes: Pupils are equal, round, and reactive to light.  Neck: Normal range of motion. Neck supple.  Cardiovascular: Normal rate and regular rhythm.   Pulmonary/Chest: Effort normal.  Abdominal: Soft.  Neurological: She is alert.  Skin: Skin is warm and dry.  Psychiatric: Her mood appears anxious. Her affect is angry, blunt and inappropriate. Her speech is rapid and/or pressured. She is aggressive.       Unable to assess homicidal or suicidal thought content because she will not answer questions.     ED Course  Procedures (including critical care time)  Labs Reviewed  CBC - Abnormal; Notable for the following:    Hemoglobin 8.5 (*)     HCT 29.1 (*)     MCV 72.2 (*)     MCH 21.1 (*)     MCHC 29.2 (*)     RDW 18.6 (*)     Platelets 551 (*)     All other components  within normal limits  SALICYLATE LEVEL - Abnormal; Notable for the following:    Salicylate Lvl <2.0 (*)     All other components within normal limits  COMPREHENSIVE METABOLIC PANEL  ETHANOL  ACETAMINOPHEN LEVEL  POCT PREGNANCY, URINE  URINE RAPID DRUG SCREEN (HOSP PERFORMED)   No results found.   1. Paranoid schizophrenia   2. Agitation   3. Tachycardia       MDM   Date: 09/23/2012  Rate: 91  Rhythm: normal sinus rhythm  QRS Axis: normal  Intervals: normal  ST/T Wave abnormalities: normal  Conduction Disutrbances:none  Narrative Interpretation:  Probable left ventricular hypertrophy  Old EKG Reviewed: unchanged    Pt uncooperative and began to become hostile. 10mg  IM Geodon given. EKG and labs able to be drawn.  Pt has low hemoglobin but this is baseline for her since early 2013. Every time patient gets agitated her pulse races. Once she was sedated her pulse went down to 96. She denies having symptoms. I discussed results with hospitalists. 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   [facility] will not take patient. She will have to be placed elsewhere.       Dorthula Matas, PA 09/23/12 0413  Dorthula Matas, PA 09/23/12 0429  Dorthula Matas, PA 09/23/12 [phone]

## 2012-09-23 NOTE — ED Notes (Signed)
Pt brought by [employer],  She was originally at [facility] but was refused due to pulse.  Pt is very uncooperative,  Refuses to answer questions
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
## 2012-09-23 NOTE — ED Notes (Signed)
Act in w/ pt

## 2012-09-23 NOTE — ED Notes (Signed)
telepsych complete pt tolerated well

## 2012-09-23 NOTE — BHH Counselor (Signed)
TC to [place] at [place]. He says that pt diverted to [facility] from [place] and pt wasn't admitted to [facility] b/c of medical issues.   Evette Cristal, [place] Assessment Counselor

## 2012-09-23 NOTE — ED Notes (Signed)
Pt wanded by security. 

## 2012-09-23 NOTE — ED Notes (Signed)
Drenda Freeze RN charge aware of needed sitter,  Noted there is no Systems developer

## 2012-09-23 NOTE — ED Notes (Signed)
Pt placed in blue scrubs.  Pt wanded.  Pt belongings placed behind nurses station.  Pt belongings: samsung smart phone with cracked face palte, hp ear phones, 5 drivers licenses, purple sweater, off white shirt, white bra, black under shirt, blue jeans and shoes.

## 2012-09-23 NOTE — ED Notes (Signed)
Up to the NS requesting to shower and change scrubs, pt declined AM medications "I'm fine." Will attempt again later.

## 2012-09-23 NOTE — ED Provider Notes (Addendum)
Filed Vitals:   09/23/12 0433  BP: 112/70  Pulse: 89  Temp:   Resp: 12   Pt seen and assessed. Not cooperative with my questioning. Has hx of schizophrenia. Psych consult ordered.   Raeford Razor, MD 09/23/12 [phone]   Psychiatry consultation was reviewed. They do not feel that patient psychiatrically stable for discharge and are recommending inpatient hospitalization. Medications ordered per their recommendations.  Raeford Razor, MD 09/23/12 [phone]
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
## 2012-09-23 NOTE — ED Notes (Signed)
Up tot he bathroom to shower and change scrubs 

## 2012-09-23 NOTE — ED Notes (Addendum)
D: Patient labile and paranoid asking this RN immediately upon entering room "Who are you and why are you watching me?". Pt guarded and asking RN to leave and close door. A: Patient with Q 15 minute safety checks maintained. Administer medications as ordered. R: Pt cooperative and denies SI/HI at this time.

## 2012-09-23 NOTE — ED Notes (Signed)
1 pt belonging bag placed in locker 42.

## 2012-09-23 NOTE — Progress Notes (Signed)
Patient accepted by Assunta Found, NP to [facility] pending bed availability. Currently no beds at [facility]. Valerie Howard
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
## 2012-09-23 NOTE — ED Notes (Signed)
ACT in w/ pt 

## 2012-09-24 ENCOUNTER — Inpatient Hospital Stay (HOSPITAL_COMMUNITY)
Admission: AD | Admit: 2012-09-24 | Payer: Federal, State, Local not specified - Other | Source: Intra-hospital | Admitting: Psychiatry

## 2012-09-24 DIAGNOSIS — F259 Schizoaffective disorder, unspecified: Secondary | ICD-10-CM

## 2012-09-24 DIAGNOSIS — Z9119 Patient's noncompliance with other medical treatment and regimen: Secondary | ICD-10-CM

## 2012-09-24 NOTE — ED Provider Notes (Signed)
0720.  Pt awake, watching TV, sitting on edge of stretcher.  Note stable VS.  No acute overnight events reported.  Pt has been accepted at [facility] and is awaiting a bed.  Will continue to follow closely.  Tobin Chad, MD 09/24/12 [phone]

## 2012-09-24 NOTE — BHH Counselor (Signed)
1/27- Pt referred to [facility]; pending review.   1/26 - Per Rosey Bath - Accepted to [facility] by Aurea Graff NP to [phone]. *As of 09/24/12 Bed is not available when informed of this admission. Told that assessment office would notify this writer when bed became available.  *Received a call from [facility] 1/27 @ 1221 stating that patient is no longer accepted. Per Minerva Areola patient is on the do not admit list and will need a higher level of care.  On-coming staff will continue seeking placement for this patient .
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
## 2012-09-24 NOTE — ED Notes (Signed)
Pt denies SI/HI/AVH, preoccupied with "I'm just waiting to leave, I'm ready to leave."  Pt seems anxious and irritable, short and defensive answers to questions from staff.

## 2012-09-24 NOTE — BHH Counselor (Signed)
As of 1/27 1306 [facility] person) sts that bed will not be available today b/c pt will need a room without a roommate. Writer will continue seeking alternative placement for this patient.

## 2012-09-24 NOTE — ED Notes (Signed)
Pt refused am medications states "I am fine, I don't need or want to take any medication.  i am functioning fine."  Pt seemed agitated when told that she had meds due.  MD notified that pt refused.

## 2012-09-24 NOTE — BHH Counselor (Signed)
Received a call back from [facility] stating that patient is accepted again; disregard prior note!!!

## 2012-09-24 NOTE — ED Notes (Signed)
Pt currently asleep; no s/s of distress noted. 
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
## 2012-09-25 ENCOUNTER — Inpatient Hospital Stay (HOSPITAL_COMMUNITY)
Admission: AD | Admit: 2012-09-25 | Discharge: 2012-10-18 | DRG: 885 | Disposition: A | Discharge status: Home / Self Care | Payer: Federal, State, Local not specified - Other | Source: Intra-hospital | Attending: Psychiatry | Admitting: Psychiatry

## 2012-09-25 ENCOUNTER — Encounter (HOSPITAL_COMMUNITY): Payer: Self-pay

## 2012-09-25 DIAGNOSIS — Z79899 Other long term (current) drug therapy: Secondary | ICD-10-CM

## 2012-09-25 DIAGNOSIS — R000 Tachycardia, unspecified: Secondary | ICD-10-CM | POA: Diagnosis present

## 2012-09-25 DIAGNOSIS — D509 Iron deficiency anemia, unspecified: Secondary | ICD-10-CM | POA: Diagnosis present

## 2012-09-25 DIAGNOSIS — F312 Bipolar disorder, current episode manic severe with psychotic features: Secondary | ICD-10-CM | POA: Diagnosis present

## 2012-09-25 DIAGNOSIS — F200 Paranoid schizophrenia: Principal | ICD-10-CM | POA: Diagnosis present

## 2012-09-25 DIAGNOSIS — F609 Personality disorder, unspecified: Secondary | ICD-10-CM | POA: Diagnosis present

## 2012-09-25 NOTE — Progress Notes (Signed)
Pt in room at this time, getting ready to eat breakfast. Pt spoke about being ready to leave and stated "you don't even know" and started rambling about being here again. Pt also said she was wanting some towels later on as she will want to shower.

## 2012-09-25 NOTE — Tx Team (Signed)
Initial Interdisciplinary Treatment Plan  PATIENT STRENGTHS: (choose at least two) Capable of independent living General fund of knowledge  PATIENT STRESSORS: Marital or family conflict   PROBLEM LIST: Problem List/Patient Goals Date to be addressed Date deferred Reason deferred Estimated date of resolution  Depression      Schizoaffective Disorder                                                 DISCHARGE CRITERIA:  Improved stabilization in mood, thinking, and/or behavior Verbal commitment to aftercare and medication compliance  PRELIMINARY DISCHARGE PLAN: Attend aftercare/continuing care group Attend PHP/IOP Outpatient therapy  PATIENT/FAMIILY INVOLVEMENT: This treatment plan has been presented to and reviewed with the patient, Yee L Balducci-McMillan.  The patient and family have been given the opportunity to ask questions and make suggestions.  Jacques Navy A 09/25/2012, 10:59 PM

## 2012-09-25 NOTE — Progress Notes (Signed)
Pt has discharge order to be transferred, pt has been informed of impending transfer. Pt has been calm and cooperative for much of the day but still having paranoid thoughts in regards to her family.
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
## 2012-09-25 NOTE — ED Notes (Signed)
Patient to be discharge to [facility] with [facility]. Respirations equal and unlabored. Skin warm and dry. NAD

## 2012-09-25 NOTE — ED Provider Notes (Signed)
The patient was sleeping this morning. She  is awaiting psychiatric placement. Vital signs are stable. The tachycardia this morning has resolved. We will continue to monitor. Anemia noted but this appears to be stable. The hemoglobin 9 months ago was 8.9. And 2 weeks ago it was a 0.5.  Celene Kras, MD 09/25/12 [phone]

## 2012-09-26 ENCOUNTER — Encounter (HOSPITAL_COMMUNITY): Payer: Self-pay | Admitting: Psychiatry

## 2012-09-26 DIAGNOSIS — F3110 Bipolar disorder, current episode manic without psychotic features, unspecified: Secondary | ICD-10-CM

## 2012-09-26 DIAGNOSIS — F312 Bipolar disorder, current episode manic severe with psychotic features: Secondary | ICD-10-CM | POA: Diagnosis present

## 2012-09-26 MED ORDER — BENZTROPINE MESYLATE 1 MG PO TABS
1.0000 mg | ORAL_TABLET | Freq: Two times a day (BID) | ORAL | Status: DC
  Administered 2012-10-01 – 2012-10-18 (×32): 1 mg via ORAL
  Filled 2012-09-26 (×50): qty 1

## 2012-09-26 MED ORDER — MAGNESIUM HYDROXIDE 400 MG/5ML PO SUSP: 30.0000 mL | Freq: Every day | ORAL | Status: DC | PRN

## 2012-09-26 MED ORDER — RISPERIDONE 2 MG PO TABS
2.0000 mg | ORAL_TABLET | Freq: Two times a day (BID) | ORAL | Status: DC
  Filled 2012-09-26 (×4): qty 1

## 2012-09-26 MED ORDER — ALUM & MAG HYDROXIDE-SIMETH 200-200-20 MG/5ML PO SUSP: 30.0000 mL | ORAL | Status: DC | PRN

## 2012-09-26 MED ORDER — ACETAMINOPHEN 325 MG PO TABS: 650.0000 mg | ORAL_TABLET | Freq: Four times a day (QID) | ORAL | Status: DC | PRN

## 2012-09-26 NOTE — Progress Notes (Signed)
[facility] LCSW Aftercare Discharge Planning Group Note  09/26/2012 1:57 PM  Participation Quality:  Did not attend    [name] 09/26/2012, 1:57 PM

## 2012-09-26 NOTE — Treatment Plan (Addendum)
Interdisciplinary Treatment Plan Update (Adult)  Date: 09/26/2012  Time Reviewed: 9:32 AM   Progress in Treatment: Attending groups: No Participating in groups: No Taking medication as prescribed: No Tolerating medication: See above  Family/Significant other contact made:  No Patient understands diagnosis:  No insight Discussing patient identified problems/goals with staff:  No  She believes she has no problems  Medical problems stabilized or resolved:  Yes Denies suicidal/homicidal ideation: Yes  In tx team Issues/concerns per patient self-inventory:  Not filled out Other:  New problem(s) identified: N/A  Reason for Continuation of Hospitalization: Delusions  Hallucinations  Interventions implemented related to continuation of hospitalization: Encourage use of medications,  Encourage group attendance and participation  Additional comments: Pt os stating she will not go to groups, nor will she take medication  This has been her pattern in the past  Estimated length of stay: until a bed becomes available  Discharge Plan: see above  New goal(s): N/A  Review of initial/current patient goals per problem list:   1.  Goal(s):Eliminate psychosis/delusions through the use of medications/therapeutic milieu  Met:  No  Target date:2/3  As evidenced by: Paranoia, delusions, anxiety  2.  Goal (s): Transfer to [facility]  Met:  No  Target date:2/3  As evidenced by: confirmation of bed  3.  Goal(s):  Met:  No  Target date:  As evidenced by:  4.  Goal(s):  Met:  No  Target date:  As evidenced by:  Attendees: Patient:  Valerie Howard 09/26/2012 9:32AM  Family:     Physician:  Thedore Mins 09/26/2012 9:32 AM   Nursing:  Liborio Nixon  09/26/2012 9:32 AM   Clinical Social Worker:  Richelle Ito 09/26/2012 9:32 AM   Extender:  Verne Spurr PA 09/26/2012 9:32 AM   Other:     Other:     Other:     Other:      Scribe for Treatment Team:   [name], 09/26/2012  9:32 AM

## 2012-09-26 NOTE — Progress Notes (Signed)
Patient ID: Valerie Howard, female   DOB: 03/15/1976, 37 y.o.   MRN: [id number] D: Patient in room on approach. Pt presented with depressed mood and flat affect. Pt turned head facing wall when talking to Clinical research associate. Pt told writer "I don't want anything please leave me alone".  Pt is isolative in room with little interaction with staff and peers. Pt denies SI/HI/AVH and pain. Pt did not attend evening wrap up group. No acute distressed noted at this time.   A:  Writer encouraged pt to discuss feelings. Pt encouraged to come to staff with any question or concerns.  R: Patient remains safe. Continue current POC.

## 2012-09-26 NOTE — ED Provider Notes (Signed)
Medical screening examination/treatment/procedure(s) were performed by non-physician practitioner and as supervising physician I was immediately available for consultation/collaboration.  Sunnie Nielsen, MD 09/26/12 2040

## 2012-09-26 NOTE — Progress Notes (Signed)
Psychoeducational Group Note  Date:  09/26/2012 Time:  2000  Group Topic/Focus:  Wrap-Up Group:   The focus of this group is to help patients review their daily goal of treatment and discuss progress on daily workbooks.  Participation Level: Did Not Attend  Participation Quality:  Not Applicable  Affect:  Not Applicable  Cognitive:  Not Applicable  Insight:  Not Applicable  Engagement in Group: Not Applicable  Additional Comments:  Pt was invited to group, but chose not to attend.  Christ Kick 09/26/2012, 8:57 PM\

## 2012-09-26 NOTE — Progress Notes (Signed)
Psychoeducational Group Note  Date:  09/26/2012 Time:  1100  Group Topic/Focus:  Personal Choices and Values:   The focus of this group is to help patients assess and explore the importance of values in their lives, how their values affect their decisions, how they express their values and what opposes their expression.  Participation Level: Did Not Attend  Participation Quality:  Not Applicable  Affect:  Not Applicable  Cognitive:  Not Applicable  Insight:  Not Applicable  Engagement in Group: Not Applicable  Additional Comments:  Pt remained standing in hall while group was being conducted.   Sharyn Lull 09/26/2012, 11:33 AM
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
## 2012-09-26 NOTE — Progress Notes (Signed)
[facility] Group Notes:  (Counselor/Nursing/MHT/Case Management/Adjunct)  07/13/2012 12:00 PM  Type of Therapy:  Group Therapy  Participation Level:  Did Not Attend  Smart, Heather N 07/13/2012, 12:00 PM  
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
## 2012-09-27 DIAGNOSIS — F200 Paranoid schizophrenia: Principal | ICD-10-CM

## 2012-09-27 MED ORDER — DIPHENHYDRAMINE HCL 50 MG/ML IJ SOLN
50.0000 mg | Freq: Once | INTRAMUSCULAR | Status: AC
  Administered 2012-09-27: 50 mg via INTRAMUSCULAR
  Filled 2012-09-27 (×2): qty 1

## 2012-09-27 MED ORDER — HALOPERIDOL LACTATE 5 MG/ML IJ SOLN
10.0000 mg | Freq: Once | INTRAMUSCULAR | Status: AC
  Filled 2012-09-27 (×2): qty 2

## 2012-09-27 MED ORDER — LORAZEPAM 2 MG/ML IJ SOLN
2.0000 mg | Freq: Once | INTRAMUSCULAR | Status: AC
  Administered 2012-09-27: 2 mg via INTRAMUSCULAR
  Filled 2012-09-27: qty 1

## 2012-09-27 MED ORDER — HALOPERIDOL 5 MG PO TABS
10.0000 mg | ORAL_TABLET | Freq: Once | ORAL | Status: AC
  Administered 2012-09-27: 10 mg via ORAL
  Filled 2012-09-27: qty 2

## 2012-09-27 MED ORDER — DIPHENHYDRAMINE HCL 50 MG/ML IJ SOLN: 50.0000 mg | Freq: Once | INTRAMUSCULAR | Status: DC
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
## 2012-09-27 NOTE — Progress Notes (Signed)
[facility] LCSW Aftercare Discharge Planning Group Note  09/27/2012 11:46 AM  Participation Quality:  Invited, but did not attend    Valerie Howard B 09/27/2012, 11:46 AM

## 2012-09-27 NOTE — Progress Notes (Signed)
Psychoeducational Group Note  Date:  09/27/2012 Time:  0930  Group Topic/Focus:  Rediscovering Joy:   The focus of this group is to explore various ways to relieve stress in a positive manner.  Participation Level: Did Not Attend  Participation Quality:  Not Applicable  Affect:  Not Applicable  Cognitive:  Not Applicable  Insight:  Not Applicable  Engagement in Group: Not Applicable  Additional Comments:  Pt refused to attend group this morning.  Cortlyn Cannell E 09/27/2012, 2:02 PM

## 2012-09-27 NOTE — Clinical Social Work Note (Signed)
[facility] Group Notes:  (Counselor/Nursing/MHT/Case Management/Adjunct)  07/12/2012 2:20 PM  Type of Therapy:  Group Therapy  Participation Level:  Did Not Attend    Summary of Progress/Problems: .balance: The topic for group was balance in life.  Pt participated in the discussion about when their life was in balance and out of balance and how this feels.  Pt discussed ways to get back in balance and short term goals they can work on to get where they want to be.    Valerie Howard 07/12/2012, 2:20 PM  

## 2012-09-27 NOTE — Progress Notes (Signed)
[facility] Group Notes:  (Nursing/MHT/Case Management/Adjunct)  Date:  09/27/2012  Time:  5:53 PM  Type of Therapy:  Psychoeducational Skills  Participation Level:  Did Not Attend  Summary of Progress/Problems: [place] attended Psychoeducational group on relaxation.  Wandra Scot 09/27/2012, 5:53 PM

## 2012-09-27 NOTE — Progress Notes (Signed)
Patient ID: Valerie Howard, female   DOB: March 26, 1976, 37 y.o.   MRN: [id number] D:  Patient woke up around 2330. Writer offered snack and drink. Pt will not answer any questions. Pt turned around and went back to bed.  No sign of distress noted at this time. A: 15 mins checks for  Safety. R: Patient remains in her room. Pt is safe.
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
## 2012-09-27 NOTE — Progress Notes (Signed)
At approximately 12:30 patient was observed still sitting in the hallway.  She was told that she would not be able to continue sitting on the floor and was asked to move to her room or the dayroom.  She did finally get up and move to her room.  She has been in her room on her bed since that time.  She has been quiet and has had no interaction with staff or peers.  Remains safe on the unit.

## 2012-09-27 NOTE — Progress Notes (Signed)
[facility] MD Progress Note  09/27/2012 1:18 PM GINEVRA TACKER  MRN:  [id number] Subjective:  "What do you need?" Met with the patient to discuss her medication and treatment.  Patient vehemently denies the need for medication and states she is not going to take medication.  She is informed that she has been evaluated by 2 psychiatrists who both feel that she would benefit from medication and that a force medication order has been written. She states she will not take medication. Diagnosis:  Schizophrenia paranoid type  ADL's:  Intact  Sleep: Poor  Appetite:  Fair  Suicidal Ideation:  Unable to assess Homicidal Ideation:  Unable to assess AEB (as evidenced by):Patient's verbal response, affect and behaviors.  Psychiatric Specialty Exam: Review of Systems  Unable to perform ROS: mental acuity    Blood pressure 123/76, pulse 86, temperature 98.7 F (37.1 C), temperature source Oral, resp. rate 18, height 5\' 8"  (1.727 m), weight 65.772 kg (145 lb), last menstrual period 09/15/2012.Body mass index is 22.05 kg/(m^2).  General Appearance: Casual  Eye Contact::  Fair  Speech:  Clear and Coherent  Volume:  Normal  Mood:  Irritable and and uncooperative  Affect:  Labile  Thought Process:  Disorganized  Orientation:  Unable to assess  Thought Content:  Delusions and Paranoid Ideation  Suicidal Thoughts:  Unable to assess  Homicidal Thoughts: Unable to assess  Memory:  Unable to assess  Judgement:  Impaired  Insight:  Lacking  Psychomotor Activity:  Normal  Concentration:  Poor  Recall:  Poor  Akathisia:  No  Handed:    AIMS (if indicated):     Assets:  Engineer, maintenance Physical Health Social Support  Sleep:  Number of Hours: 3.25    Current Medications: Current Facility-Administered Medications  Medication Dose Route Frequency Provider Last Rate Last Dose  . acetaminophen (TYLENOL) tablet 650 mg  650 mg Oral Q6H PRN Kerry Hough, PA      . alum & mag  hydroxide-simeth (MAALOX/MYLANTA) 200-200-20 MG/5ML suspension 30 mL  30 mL Oral Q4H PRN Kerry Hough, PA      . benztropine (COGENTIN) tablet 1 mg  1 mg Oral BID Mojeed Akintayo      . magnesium hydroxide (MILK OF MAGNESIA) suspension 30 mL  30 mL Oral Daily PRN Kerry Hough, PA        Lab Results: No results found for this or any previous visit (from the past 48 hour(s)).  Physical Findings: AIMS: Facial and Oral Movements Muscles of Facial Expression: None, normal Lips and Perioral Area: None, normal Jaw: None, normal Tongue: None, normal,Extremity Movements Upper (arms, wrists, hands, fingers): None, normal Lower (legs, knees, ankles, toes): None, normal, Trunk Movements Neck, shoulders, hips: None, normal, Overall Severity Severity of abnormal movements (highest score from questions above): None, normal Incapacitation due to abnormal movements: None, normal Patient's awareness of abnormal movements (rate only patient's report): No Awareness, Dental Status Current problems with teeth and/or dentures?: No Does patient usually wear dentures?: No  CIWA:  CIWA-Ar Total: 0  COWS:  COWS Total Score: 2   Treatment Plan Summary: Daily contact with patient to assess and evaluate symptoms and progress in treatment Medication management  Plan: 1. Continue crisis management and stabilization. 2. Medication management to reduce current symptoms to base line and improve the patient's overall level of functioning 3. Treat health problems as indicated. 4. Develop treatment plan to decrease risk of relapse upon discharge and the need for readmission. 5. Psycho-social  education regarding relapse prevention and self care. 6. Health care follow up as needed for medical problems. 7. Restart home medications where appropriate. 8. 2nd opinion form is completed and signed on chart. 9. Patient will be placed on the [facility] list by CM. 10. Patient was informed of force medication order. 11. Haldol  10mg  IM + Benadryl 50mg  IM+Ativan 2mg  IM is ordered and completed without difficulty after discussion with MD.  Medical Decision Making Problem Points:  Established problem, worsening (2) and Review of psycho-social stressors (1) Data Points:  Review of medication regiment & side effects (2)  I certify that inpatient services furnished can reasonably be expected to improve the patient's condition.  Rona Ravens. Latania Bascomb PAC 09/27/2012, 1:18 PM

## 2012-09-27 NOTE — Progress Notes (Signed)
Patient continues to stay in her room.  She is lying on her bed with her eyes closed.  A dinner tray was brought back from the cafeteria, but patient does not want it at this time.  No interaction with staff at this time.

## 2012-09-28 MED ORDER — DIPHENHYDRAMINE HCL 50 MG PO CAPS
50.0000 mg | ORAL_CAPSULE | Freq: Every day | ORAL | Status: DC
  Administered 2012-09-28 – 2012-10-09 (×10): 50 mg via ORAL
  Filled 2012-09-28 (×15): qty 1

## 2012-09-28 MED ORDER — HALOPERIDOL LACTATE 5 MG/ML IJ SOLN
10.0000 mg | Freq: Every day | INTRAMUSCULAR | Status: DC
  Filled 2012-09-28 (×6): qty 2

## 2012-09-28 MED ORDER — LORAZEPAM 2 MG/ML IJ SOLN: 2.0000 mg | Freq: Once | INTRAMUSCULAR | Status: AC

## 2012-09-28 MED ORDER — LORAZEPAM 2 MG/ML IJ SOLN: 2.0000 mg | Freq: Every day | INTRAMUSCULAR | Status: DC

## 2012-09-28 MED ORDER — HALOPERIDOL 5 MG PO TABS
10.0000 mg | ORAL_TABLET | Freq: Every day | ORAL | Status: DC
  Administered 2012-09-28 – 2012-10-01 (×3): 10 mg via ORAL
  Filled 2012-09-28 (×7): qty 2

## 2012-09-28 MED ORDER — LORAZEPAM 2 MG/ML IJ SOLN
2.0000 mg | Freq: Every day | INTRAMUSCULAR | Status: DC
  Filled 2012-09-28 (×2): qty 1

## 2012-09-28 MED ORDER — DIPHENHYDRAMINE HCL 50 MG/ML IJ SOLN
50.0000 mg | Freq: Every day | INTRAMUSCULAR | Status: DC
  Administered 2012-10-07: 50 mg via INTRAMUSCULAR
  Filled 2012-09-28 (×15): qty 1

## 2012-09-28 MED ORDER — LORAZEPAM 1 MG PO TABS
ORAL_TABLET | ORAL | Status: AC
  Administered 2012-09-28: 2 mg via ORAL
  Filled 2012-09-28: qty 2

## 2012-09-28 MED ORDER — HALOPERIDOL LACTATE 5 MG/ML IJ SOLN
10.0000 mg | Freq: Every day | INTRAMUSCULAR | Status: DC
  Filled 2012-09-28 (×3): qty 2

## 2012-09-28 MED ORDER — LORAZEPAM 1 MG PO TABS
2.0000 mg | ORAL_TABLET | Freq: Once | ORAL | Status: AC
  Administered 2012-09-28: 2 mg via ORAL

## 2012-09-28 MED ORDER — LORAZEPAM 1 MG PO TABS
2.0000 mg | ORAL_TABLET | Freq: Every day | ORAL | Status: DC
  Administered 2012-09-30 – 2012-10-18 (×19): 2 mg via ORAL
  Filled 2012-09-28 (×20): qty 2

## 2012-09-28 NOTE — Clinical Social Work Note (Signed)
[facility] LCSW Group Therapy  09/28/2012 1:49 PM   Type of Therapy:  Group Therapy  Participation Level:  Did not attend  Summary of Progress/Problems: Today's group focused on relapse prevention.  We defined the term, and then brainstormed on ways to prevent relapse.  Daryel Gerald B 09/28/2012 , 1:49 PM

## 2012-09-28 NOTE — Progress Notes (Signed)
Psychoeducational Group Note  Date:  09/28/2012 Time:  2000  Group Topic/Focus:  Wrap-Up Group:   The focus of this group is to help patients review their daily goal of treatment and discuss progress on daily workbooks.  Participation Level: Did Not Attend  Participation Quality:  Not Applicable  Affect:  Not Applicable  Cognitive:  Not Applicable  Insight:  Not Applicable  Engagement in Group: Not Applicable  Additional Comments:  Pt declined to attend group this evening.  Kaleen Odea R 09/28/2012, 9:10 PM

## 2012-09-28 NOTE — Progress Notes (Signed)
PATIENT DID NOT ATTEND GROUP

## 2012-09-28 NOTE — Progress Notes (Signed)
[facility] MD Progress Note  09/28/2012 11:41 AM Valerie Howard  MRN:  [id number] Subjective:  "I don't need any medication, I'm thinking clearly." Patient is seen with RN, she again refuses to take medications stating she doesn't need any. She is given name of each medication, indications, and dosages. She takes them by mouth. Oral check is clear.  Diagnosis:  Schizophrenia, paranoid type  ADL's:  Intact  Sleep: Poor  Appetite:  patient refuses to answer questions  Suicidal Ideation:  Unable to assess patient refuses to answer questions Homicidal Ideation:  Unable to assess patient refuses to answer questions AEB (as evidenced by):Patient's verbal response, affect, behavior, and participation in unit activities.  Psychiatric Specialty Exam: Review of Systems  Unable to perform ROS: other   Patient is angry and irritable, uncooperative, and refuses to answer questions.  Blood pressure 123/76, pulse 86, temperature 98.7 F (37.1 C), temperature source Oral, resp. rate 18, height 5\' 8"  (1.727 m), weight 65.772 kg (145 lb), last menstrual period 09/15/2012.Body mass index is 22.05 kg/(m^2).  General Appearance: Casual  Eye Contact::  Minimal  Speech:  angry  Volume:  Increased  Mood:  Angry and Irritable  Affect:  Labile  Thought Process:  NA  Orientation:  NA  Thought Content:  NA  Suicidal Thoughts:  NA  Homicidal Thoughts:  NA  Memory:  NA  Judgement:  Impaired  Insight:  Lacking  Psychomotor Activity:  NA  Concentration:  Poor  Recall:  Poor  Akathisia:  No  Handed:    AIMS (if indicated):     Assets:  Housing Physical Health  Sleep:  Number of Hours: 4.5    Current Medications: Current Facility-Administered Medications  Medication Dose Route Frequency Provider Last Rate Last Dose  . acetaminophen (TYLENOL) tablet 650 mg  650 mg Oral Q6H PRN Kerry Hough, PA      . alum & mag hydroxide-simeth (MAALOX/MYLANTA) 200-200-20 MG/5ML suspension 30 mL  30 mL Oral  Q4H PRN Kerry Hough, PA      . benztropine (COGENTIN) tablet 1 mg  1 mg Oral BID Mojeed Akintayo      . diphenhydrAMINE (BENADRYL) capsule 50 mg  50 mg Oral Daily Verne Spurr, PA-C   50 mg at 09/28/12 1000   Or  . diphenhydrAMINE (BENADRYL) injection 50 mg  50 mg Intramuscular Daily Verne Spurr, PA-C      . haloperidol (HALDOL) tablet 10 mg  10 mg Oral Daily Verne Spurr, PA-C   10 mg at 09/28/12 1124   Or  . haloperidol lactate (HALDOL) injection 10 mg  10 mg Intramuscular Daily Verne Spurr, PA-C      . LORazepam (ATIVAN) 1 MG tablet           . LORazepam (ATIVAN) injection 2 mg  2 mg Intramuscular Daily Verne Spurr, PA-C      . LORazepam (ATIVAN) tablet 2 mg  2 mg Oral Daily Mojeed Akintayo       Or  . LORazepam (ATIVAN) injection 2 mg  2 mg Intramuscular Daily Mojeed Akintayo      . magnesium hydroxide (MILK OF MAGNESIA) suspension 30 mL  30 mL Oral Daily PRN Kerry Hough, PA        Lab Results: No results found for this or any previous visit (from the past 48 hour(s)).  Physical Findings: AIMS: Facial and Oral Movements Muscles of Facial Expression: None, normal Lips and Perioral Area: None, normal Jaw: None, normal Tongue: None, normal,Extremity Movements  Upper (arms, wrists, hands, fingers): None, normal Lower (legs, knees, ankles, toes): None, normal, Trunk Movements Neck, shoulders, hips: None, normal, Overall Severity Severity of abnormal movements (highest score from questions above): None, normal Incapacitation due to abnormal movements: None, normal Patient's awareness of abnormal movements (rate only patient's report): No Awareness, Dental Status Current problems with teeth and/or dentures?: No Does patient usually wear dentures?: No  CIWA:  CIWA-Ar Total: 0  COWS:  COWS Total Score: 2   Treatment Plan Summary: Daily contact with patient to assess and evaluate symptoms and progress in treatment Medication management  Plan: 1. Continue crisis  management and stabilization. 2. Medication management to reduce current symptoms to base line and improve the     patient's overall level of functioning 3. Treat health problems as indicated. 4. Develop treatment plan to decrease risk of relapse upon discharge and the need for     readmission. 5. Psycho-social education regarding relapse prevention and self care. 6. Health care follow up as needed for medical problems. 7.Will continue with Haldol 10mg  qd, Benadryl 50mg  po qd, and Ativan 2mg  po qd for psychosis. 8. May use IM if patient is uncooperative. 9. She is currently on the [facility] waiting list.  ELOS: 3+ days depending on response to treatment and bed availability at [facility]. Medical Decision Making Problem Points:  Established problem, stable/improving (1) and Review of psycho-social stressors (1) Data Points:  Order Aims Assessment (2) Review of medication regiment & side effects (2)  I certify that inpatient services furnished can reasonably be expected to improve the patient's condition.   Rona Ravens. Shenee Wignall PAC 09/28/2012, 11:41 AM

## 2012-09-28 NOTE — Progress Notes (Signed)
[facility] LCSW Aftercare Discharge Planning Group Note  09/28/2012 9:24 AM  Participation Quality:  DID NOT ATTEND  Smart, Heather N 09/28/2012, 9:24 AM 

## 2012-09-28 NOTE — Progress Notes (Signed)
D: Patient observed sitting in her room by the window, avoiding eye contact. Patient non-compliant with morning medications and forced medications ordered per MD. Patient verbalized that she did not want to be given any IM injections and agreed to take PO medications instead. Prior to medication administration, patient stated " I don't need to take any medication", "I'm sitting here and I'm not bothering anyone". Declined self inventory sheet today.  A: Support and encouragement provided to patient. Scheduled medications administered per MD orders. Maintain Q15 minute checks for safety.  R: Patient denies SI/HI. Patient remains safe on the unit.

## 2012-09-29 MED ORDER — DIPHENHYDRAMINE HCL 25 MG PO CAPS: 25.0000 mg | ORAL_CAPSULE | Freq: Four times a day (QID) | ORAL | Status: DC | PRN

## 2012-09-29 MED ORDER — HALOPERIDOL LACTATE 5 MG/ML IJ SOLN: 5.0000 mg | Freq: Four times a day (QID) | INTRAMUSCULAR | Status: DC | PRN

## 2012-09-29 MED ORDER — HALOPERIDOL 5 MG PO TABS: 5.0000 mg | ORAL_TABLET | Freq: Four times a day (QID) | ORAL | Status: DC | PRN

## 2012-09-29 MED ORDER — DIPHENHYDRAMINE HCL 50 MG/ML IJ SOLN
25.0000 mg | Freq: Four times a day (QID) | INTRAMUSCULAR | Status: DC | PRN
  Filled 2012-09-29: qty 1

## 2012-09-29 NOTE — Progress Notes (Signed)
Patient ID: Valerie Howard, female   DOB: 1975/12/16, 37 y.o.   MRN: [id number] D)  Has been sleeping this evening, after being medicated on previous shift.  Eyes closed, resp reg, unlabored, no c/o's voiced. A)  Will continue to monitor q 15 minutes for safety. R)  Safety maintained.

## 2012-09-29 NOTE — Clinical Social Work Psychosocial (Signed)
[facility] Group Notes: (Clinical Social Work)   09/29/2012      Type of Therapy:  Group Therapy   Participation Level:  Did Not Attend    Ambrose Mantle, LCSW 09/29/2012, 12:35 PM

## 2012-09-29 NOTE — Progress Notes (Signed)
Psychoeducational Group Note  Date:  09/29/2012 Time:  0945 am  Group Topic/Focus:  Identifying Needs:   The focus of this group is to help patients identify their personal needs that have been historically problematic and identify healthy behaviors to address their needs.  Participation Level:  Did Not Attend   [name] 09/29/2012,11:30 AM

## 2012-09-29 NOTE — Progress Notes (Signed)
Patient ID: Valerie Howard, female   DOB: October 21, 1975, 37 y.o.   MRN: [id number] [facility] MD Progress Note  09/29/2012 4:15 PM [name]  MRN:  [id number] Subjective: The patient is refusing medications and reports she is fine. She is not cooperative with the interviewed. She is lying in bed. She does appear angry.   Diagnosis:  Schizophrenia, paranoid type  ADL's:  Intact  Sleep: Poor  Appetite:  Fair  Suicidal Ideation:  Patient denies.  Homicidal Ideation:  Patient denies.  AEB (as evidenced by):Patient's verbal response, affect, behavior, and participation in unit activities.  Psychiatric Specialty Exam: Review of Systems  Unable to perform ROS: other   Patient is angry and irritable, uncooperative, and refuses to answer questions.  Blood pressure 134/87, pulse 111, temperature 98.4 F (36.9 C), temperature source Oral, resp. rate 18, height 5\' 8"  (1.727 m), weight 65.772 kg (145 lb), last menstrual period 09/15/2012.Body mass index is 22.05 kg/(m^2).  General Appearance: Casual  Eye Contact::  Minimal  Speech:  angry  Volume:  Increased  Mood:  Angry and Irritable  Affect:  Labile  Thought Process:  NA  Orientation:  NA  Thought Content:  NA  Suicidal Thoughts:  NA  Homicidal Thoughts:  NA  Memory:  Immediate: Intact Recent: Intact  Judgement:  Impaired  Insight:  Lacking  Psychomotor Activity:  NA  Concentration:  Poor  Recall:  Poor  Akathisia:  No  Handed:  Would not answer.  AIMS (if indicated):   As noted  Assets:  Housing Physical Health  Sleep:  Number of Hours: 5.5    Current Medications: Current Facility-Administered Medications  Medication Dose Route Frequency Provider Last Rate Last Dose  . acetaminophen (TYLENOL) tablet 650 mg  650 mg Oral Q6H PRN Kerry Hough, PA      . alum & mag hydroxide-simeth (MAALOX/MYLANTA) 200-200-20 MG/5ML suspension 30 mL  30 mL Oral Q4H PRN Kerry Hough, PA      . benztropine (COGENTIN)  tablet 1 mg  1 mg Oral BID Mojeed Akintayo      . diphenhydrAMINE (BENADRYL) capsule 50 mg  50 mg Oral Daily Verne Spurr, PA-C   50 mg at 09/28/12 1000   Or  . diphenhydrAMINE (BENADRYL) injection 50 mg  50 mg Intramuscular Daily Verne Spurr, PA-C      . haloperidol (HALDOL) tablet 10 mg  10 mg Oral Daily Verne Spurr, PA-C   10 mg at 09/28/12 1124   Or  . haloperidol lactate (HALDOL) injection 10 mg  10 mg Intramuscular Daily Verne Spurr, PA-C      . LORazepam (ATIVAN) injection 2 mg  2 mg Intramuscular Daily Verne Spurr, PA-C      . LORazepam (ATIVAN) tablet 2 mg  2 mg Oral Daily Mojeed Akintayo       Or  . LORazepam (ATIVAN) injection 2 mg  2 mg Intramuscular Daily Mojeed Akintayo      . magnesium hydroxide (MILK OF MAGNESIA) suspension 30 mL  30 mL Oral Daily PRN Kerry Hough, PA        Lab Results: No results found for this or any previous visit (from the past 48 hour(s)).  Physical Findings: AIMS: Facial and Oral Movements Muscles of Facial Expression: None, normal Lips and Perioral Area: None, normal Jaw: None, normal Tongue: None, normal,Extremity Movements Upper (arms, wrists, hands, fingers): None, normal Lower (legs, knees, ankles, toes): None, normal, Trunk Movements Neck, shoulders, hips: None, normal, Overall Severity Severity of  abnormal movements (highest score from questions above): None, normal Incapacitation due to abnormal movements: None, normal Patient's awareness of abnormal movements (rate only patient's report): No Awareness, Dental Status Current problems with teeth and/or dentures?: No Does patient usually wear dentures?: No  CIWA:  CIWA-Ar Total: 0  COWS:  COWS Total Score: 2   Treatment Plan Summary: Daily contact with patient to assess and evaluate symptoms and progress in treatment Medication management  Plan: 1. Continue crisis management and stabilization. 2. Medication management to reduce current symptoms to base line and improve  the patient's overall level of functioning 3. Treat health problems as indicated. 4. Develop treatment plan to decrease risk of relapse upon discharge and the need for readmission. 5. Psycho-social education regarding relapse prevention and self care. 6. Health care follow up as needed for medical problems. 7.Will continue with Haldol 10mg  qd, diphenhydramine 50mg  po qd, and Ativan 2mg  po qd for psychosis. As patient is refusing this medication may give this (haldol and diphenhydramine) as a PRN Q 6 hours PRN for agitation or psychosis. 8. May use IM if patient is uncooperative. 9. She is currently on the [facility] waiting list.  ELOS: 3+ days depending on response to treatment and bed availability at [facility].  Medical Decision Making Problem Points:  Established problem, worsening (2) and Review of psycho-social stressors (1) Data Points:  Order Aims Assessment (2) Review of medication regiment & side effects (2)  I certify that inpatient services furnished can reasonably be expected to improve the patient's condition.   Jacqulyn Cane, MD 09/29/2012, 4:15 PM

## 2012-09-29 NOTE — Progress Notes (Addendum)
Patient ID: Valerie Howard, female   DOB: 1975-09-01, 37 y.o.   MRN: [id number] 08-29-2012 nursing shift note: D: met with the patient this am to get aquainted. A took her am medications to her and she adamantly refused them. Had another RN attempt to administer the medications.  R: she began quoting the bible and continues to refuse the medications from the Clinical research associate and the other RN.  She refused to fill out her inventory sheet. Will continue to monitor and q 15 min cks continue.

## 2012-09-29 NOTE — Progress Notes (Signed)
Psychoeducational Group Note  Date:  09/29/2012 Time:1000am  Group Topic/Focus:  Identifying Needs:   The focus of this group is to help patients identify their personal needs that have been historically problematic and identify healthy behaviors to address their needs.  Participation Level:  Did Not Attend  Participation Quality:    Affect:  Cognitive: Insight:  Engagement in Group:  Additional Comments:  Inventory sheet group   Valerie Howard 09/29/2012,10:13 AM

## 2012-09-29 NOTE — Progress Notes (Signed)
Patient ID: AMIYLAH ANASTOS, female   DOB: 07/20/1976, 37 y.o.   MRN: [id number] D:"Can I have some vaseline for my hair?" Also: "One is not enough I need more--there is no number." A: Pt. denies S/H/I's and is fairly reclusive in her behavior and refusal of medication or to attend group. Pt. offers minimal information and is rather abrupt in her responses to questions. Pt. Received 4 packets of vaseline and quickly returned to her room and went to bed. R: Will continue to monitor for unpredictable behavior.

## 2012-09-30 NOTE — Progress Notes (Signed)
Psychoeducational Group Note  Date:  09/30/2012 Time:  0945 am  Group Topic/Focus:  Making Healthy Choices:   The focus of this group is to help patients identify negative/unhealthy choices they were using prior to admission and identify positive/healthier coping strategies to replace them upon discharge.  Participation Level:  Did Not Attend  Laiana Fratus J 09/30/2012, 10:29 AM  

## 2012-09-30 NOTE — Progress Notes (Signed)
[facility] Group Notes:  (Nursing/MHT/Case Management/Adjunct)  Date:  09/30/2012  Time:  11:17 AM  Type of Therapy:  self inventory  Participation Level:  Did Not Attend   Summary of Progress/Problems:  [name] 09/30/2012, 11:17 AM

## 2012-09-30 NOTE — Progress Notes (Signed)
Patient ID: Valerie Howard, female   DOB: October 26, 1975, 37 y.o.   MRN: [id number] [facility] MD Progress Note  09/30/2012 7:04 PM Valerie Howard  MRN:  [id number] Subjective: The patient is refusing medications and reports she is fine. She is not cooperative with the interviewed. She is lying in bed. She does appear slightly angry. She states she does not need medications.   Diagnosis:  Schizophrenia, paranoid type  ADL's:  Intact  Sleep: Poor  Appetite:  Fair  Suicidal Ideation:  Patient denies.  Homicidal Ideation:  Patient denies.  AEB (as evidenced by):Patient's verbal response, affect, behavior, and participation in unit activities.  Psychiatric Specialty Exam: Review of Systems  Unable to perform ROS: other   Patient is angry and irritable, uncooperative, and refuses to answer questions.  Blood pressure 141/89, pulse 103, temperature 99.1 F (37.3 C), temperature source Oral, resp. rate 16, height 5\' 8"  (1.727 m), weight 65.772 kg (145 lb), last menstrual period 09/15/2012.Body mass index is 22.05 kg/(m^2).  General Appearance: Casual  Eye Contact::  Minimal  Speech:  angry  Volume:  Increased  Mood:  Angry and Irritable  Affect:  Labile  Thought Process:  NA  Orientation:  NA  Thought Content:  NA  Suicidal Thoughts:  NA  Homicidal Thoughts:  NA  Memory:  Immediate: Intact Recent: Intact  Judgement:  Impaired  Insight:  Lacking  Psychomotor Activity:  NA  Concentration:  Poor  Recall:  Poor  Akathisia:  No  Handed:  Would not answer.  AIMS (if indicated):   As noted  Assets:  Housing Physical Health  Sleep:  Number of Hours: 3    Current Medications: Current Facility-Administered Medications  Medication Dose Route Frequency Provider Last Rate Last Dose  . acetaminophen (TYLENOL) tablet 650 mg  650 mg Oral Q6H PRN Kerry Hough, PA      . alum & mag hydroxide-simeth (MAALOX/MYLANTA) 200-200-20 MG/5ML suspension 30 mL  30 mL Oral Q4H PRN Kerry Hough, PA      . benztropine (COGENTIN) tablet 1 mg  1 mg Oral BID Mojeed Akintayo      . diphenhydrAMINE (BENADRYL) capsule 25 mg  25 mg Oral Q6H PRN Larena Sox, MD       Or  . diphenhydrAMINE (BENADRYL) injection 25 mg  25 mg Intramuscular Q6H PRN Larena Sox, MD      . diphenhydrAMINE (BENADRYL) capsule 50 mg  50 mg Oral Daily Verne Spurr, PA-C   50 mg at 09/30/12 1303   Or  . diphenhydrAMINE (BENADRYL) injection 50 mg  50 mg Intramuscular Daily Verne Spurr, PA-C      . haloperidol (HALDOL) tablet 10 mg  10 mg Oral Daily Verne Spurr, PA-C   10 mg at 09/30/12 1303   Or  . haloperidol lactate (HALDOL) injection 10 mg  10 mg Intramuscular Daily Verne Spurr, PA-C      . haloperidol (HALDOL) tablet 5 mg  5 mg Oral Q6H PRN Larena Sox, MD       Or  . haloperidol lactate (HALDOL) injection 5 mg  5 mg Intramuscular Q6H PRN Larena Sox, MD      . LORazepam (ATIVAN) injection 2 mg  2 mg Intramuscular Daily Verne Spurr, PA-C      . LORazepam (ATIVAN) tablet 2 mg  2 mg Oral Daily Mojeed Akintayo   2 mg at 09/30/12 1304   Or  . LORazepam (ATIVAN) injection 2 mg  2 mg Intramuscular  Daily Mojeed Akintayo      . magnesium hydroxide (MILK OF MAGNESIA) suspension 30 mL  30 mL Oral Daily PRN Kerry Hough, PA        Lab Results: No results found for this or any previous visit (from the past 48 hour(s)).  Physical Findings: AIMS: Facial and Oral Movements Muscles of Facial Expression: None, normal Lips and Perioral Area: None, normal Jaw: None, normal Tongue: None, normal,Extremity Movements Upper (arms, wrists, hands, fingers): None, normal Lower (legs, knees, ankles, toes): None, normal, Trunk Movements Neck, shoulders, hips: None, normal, Overall Severity Severity of abnormal movements (highest score from questions above): None, normal Incapacitation due to abnormal movements: None, normal Patient's awareness of abnormal movements (rate only patient's report):  No Awareness, Dental Status Current problems with teeth and/or dentures?: No Does patient usually wear dentures?: No  CIWA:  CIWA-Ar Total: 0  COWS:  COWS Total Score: 2   Treatment Plan Summary: Daily contact with patient to assess and evaluate symptoms and progress in treatment Medication management  Plan: 1. Continue crisis management and stabilization. 2. Medication management to reduce current symptoms to base line and improve the patient's overall level of functioning. -will force medications today. Haldol 10 mg today. -Will continue with Haldol 10mg  qd, diphenhydramine 50mg  po qd, and Ativan 2mg  po qd for psychosis. As patient is refusing this medication may give this (haldol and diphenhydramine) as a PRN Q 6 hours PRN for agitation or psychosis. May use IM if patient is uncooperative. 3. Treat health problems as indicated. 4. Develop treatment plan to decrease risk of relapse upon discharge and the need for readmission. 5. Psycho-social education regarding relapse prevention and self care. 6. Health care follow up as needed for medical problems. 7. She is currently on the [facility] waiting list.  ELOS: 3+ days depending on response to treatment and bed availability at [facility].    Medical Decision Making Problem Points:  Established problem, worsening (2) and Review of psycho-social stressors (1) Data Points:  Order Aims Assessment (2) Review of medication regiment & side effects (2)  I certify that inpatient services furnished can reasonably be expected to improve the patient's condition.   Jacqulyn Cane, MD 09/30/2012, 7:04 PM

## 2012-09-30 NOTE — Clinical Social Work Psychosocial (Signed)
[facility] Group Notes: (Clinical Social Work)   09/30/2012      Type of Therapy:  Group Therapy   Participation Level:  Did Not Attend    Ambrose Mantle, LCSW 09/30/2012, 12:45 PM

## 2012-09-30 NOTE — Progress Notes (Signed)
Patient ID: Valerie Howard, female   DOB: 10/10/1975, 36 y.o.   MRN: [id number] The patient is resting in bed with eyes closed. No distress noted. Will continue to monitor. 

## 2012-09-30 NOTE — Progress Notes (Signed)
Patient ID: Valerie Howard, female   DOB: Mar 02, 1976, 37 y.o.   MRN: [id number] D. The patient has been isolating most of the morning in her room. She has been hostile and easily agitated when approached by staff. Speech is loud and pressured. Refused to all medications stating she doesn't need any medication. A. Met with patient briefly. Attempted to review medications with patient. Encouraged to attend Patient Self Inventory group.  R. Refused all medications and became agitated with staff. Did not attend morning group.

## 2012-09-30 NOTE — Progress Notes (Signed)
Spoke with patient about taking by mouth medications willingly around 1155 and explained to her the theraputic benefits and the doctors agree that she needs these medications to benefit her therapy at [facility]  Also told her about the force medication order and explained that staff would have to make her take a shot if she didn't take her medications by mouth  Pt continued to say she would not take them because she doesn't need them   After extensive conversation with her and her nurse it was decided to allow the patient to go to lunch and resume the interaction afterward   When pt returned from lunch staff took im medications to her room with a show of support including several other staff members   At that point pt agreed to take medications by mouth and same was administered to her she received ativan 2mg   Haldol 10 mg and benedryl 50 mg at around 1300  Continued to offer her verbal support and encouragement and allowed time for pt to vent  She continues to maintain she does not need or want medications   And said her behavior does not warrant her being forced to take medications   She is logical and coherent in her thinking   She was polite and thanked this staff person after interaction

## 2012-09-30 NOTE — Progress Notes (Signed)
Pt wanted it to be recorded that her father was the person who took out the involuntary papers on her

## 2012-10-01 DIAGNOSIS — R000 Tachycardia, unspecified: Secondary | ICD-10-CM | POA: Diagnosis present

## 2012-10-01 MED ORDER — HALOPERIDOL LACTATE 5 MG/ML IJ SOLN
10.0000 mg | Freq: Two times a day (BID) | INTRAMUSCULAR | Status: DC
  Filled 2012-10-01 (×10): qty 2

## 2012-10-01 MED ORDER — HALOPERIDOL 5 MG PO TABS
10.0000 mg | ORAL_TABLET | Freq: Two times a day (BID) | ORAL | Status: DC
  Administered 2012-10-01 – 2012-10-05 (×8): 10 mg via ORAL
  Filled 2012-10-01 (×10): qty 2

## 2012-10-01 NOTE — Progress Notes (Signed)
Patient ID: Valerie Howard, female   DOB: [birth year], 37 y.o.   MRN: [id number] The patient is resting in bed with eyes closed. No distress noted. Will continue to monitor.

## 2012-10-01 NOTE — Progress Notes (Signed)
[facility] LCSW Aftercare Discharge Planning Group Note  10/01/2012 1:20 PM  Participation Quality:  Did not attend    [name] 10/01/2012, 1:20 PM

## 2012-10-01 NOTE — Treatment Plan (Signed)
Interdisciplinary Treatment Plan Update (Adult)  Date: 10/01/2012  Time Reviewed: 4:52 PM   Progress in Treatment: Attending groups: No Participating in groups: No Taking medication as prescribed: Yes Tolerating medication: Yes  Family/Significant other contact made:  No Patient understands diagnosis:  No insight Discussing patient identified problems/goals with staff:  No  She believes she has no problems  Medical problems stabilized or resolved:  Yes Denies suicidal/homicidal ideation: Yes  In tx team Issues/concerns per patient self-inventory:  Not filled out Other:  New problem(s) identified: N/A  Reason for Continuation of Hospitalization: Delusions  Hallucinations  Interventions implemented related to continuation of hospitalization: Second opinion to force medication obtained Haldol trial with haldol IM ordered if she refuses Encourage use of medications,  Encourage group attendance and participation  Additional comments: Pt is stating she will not go to groups, and is only taking medication under duress  Estimated length of stay: until a bed becomes available at [facility], or she becomes asymptomatic enough that her husband is willing to take her home  Discharge Plan: see above  New goal(s): N/A  Review of initial/current patient goals per problem list:   1.  Goal(s):Eliminate psychosis/delusions through the use of medications/therapeutic milieu  Met:  No  Target date:2/7  As evidenced by: Paranoia, delusions, anxiety  2.  Goal (s): Transfer to [facility]  Met:  No  Target date:2/7  As evidenced by: confirmation of bed  3.  Goal(s):  Met:  No  Target date:  As evidenced by:  4.  Goal(s):  Met:  No  Target date:  As evidenced by:  Attendees: Patient:     Family:     Physician:  Thedore Mins 10/01/2012 4:52 PM   Nursing:  Novella Rob  10/01/2012 4:52 PM   Clinical Social Worker:  Richelle Ito 10/01/2012 4:52 PM   Extender:  Verne Spurr PA 10/01/2012 4:52  PM   Other:     Other:     Other:     Other:      Scribe for Treatment Team:   [name], 10/01/2012 4:52 PM

## 2012-10-01 NOTE — Progress Notes (Signed)
Adult Psychoeducational Group Note  Date:  10/01/2012 Time:  11:00am  Group Topic/Focus:  Crisis Planning:   The purpose of this group is to help patients create a crisis plan for use upon discharge or in the future, as needed.  Participation Level:  Did Not Attend  Participation Quality:    Affect:    Cognitive:    Insight:   Engagement in Group:    Modes of Intervention:    Additional Comments:  Pt refused to attend.  Isla Pence M 10/01/2012, 7:15 PM

## 2012-10-01 NOTE — Progress Notes (Signed)
Patient ID: Valerie Howard, female   DOB: 22-Jan-1976, 37 y.o.   MRN: [id number] [facility] MD Progress Note  10/01/2012 1:22 PM Valerie Howard  MRN:  [id number] Subjective: "I'm present." Met with the patient today to discuss her care and response to treatment. She is currently under a "Force Meds Order" and is taking oral Haldol 10mg  po qd with benadryl or Cogentin qd. She is actually a little better today. She is just a little less angry today.  Diagnosis:  Schizophrenia, paranoid type  ADL's:  Intact  Sleep: Poor  Appetite:  Fair  Suicidal Ideation:  Patient denies.  Homicidal Ideation:  Patient denies.  AEB (as evidenced by):Patient's verbal response, affect, behavior, and participation in unit activities.  Psychiatric Specialty Exam: Review of Systems  Unable to perform ROS: other   Patient is angry and irritable, uncooperative, and refuses to answer questions.  Blood pressure 147/94, pulse 121, temperature 97.9 F (36.6 C), temperature source Oral, resp. rate 18, height 5\' 8"  (1.727 m), weight 65.772 kg (145 lb), last menstrual period 09/15/2012.Body mass index is 22.05 kg/(m^2).  General Appearance: Casual  Eye Contact::  Minimal  Speech:  angry  Volume:  Increased  Mood:  Less angry less labile  Affect:  Labile  Thought Process:  NA  Orientation:  NA  Thought Content:  NA  Suicidal Thoughts:  NA  Homicidal Thoughts:  NA  Memory:  Immediate: Intact Recent: Intact  Judgement:  Impaired  Insight:  Lacking  Psychomotor Activity:  NA  Concentration:  Poor  Recall:  Poor  Akathisia:  No  Handed:  Would not answer.  AIMS (if indicated):   As noted  Assets:  Housing Physical Health  Sleep:  Number of Hours: 4.75    Current Medications: Current Facility-Administered Medications  Medication Dose Route Frequency Provider Last Rate Last Dose  . acetaminophen (TYLENOL) tablet 650 mg  650 mg Oral Q6H PRN Kerry Hough, PA      . alum & mag  hydroxide-simeth (MAALOX/MYLANTA) 200-200-20 MG/5ML suspension 30 mL  30 mL Oral Q4H PRN Kerry Hough, PA      . benztropine (COGENTIN) tablet 1 mg  1 mg Oral BID Mojeed Akintayo   1 mg at 10/01/12 0813  . diphenhydrAMINE (BENADRYL) capsule 25 mg  25 mg Oral Q6H PRN Larena Sox, MD       Or  . diphenhydrAMINE (BENADRYL) injection 25 mg  25 mg Intramuscular Q6H PRN Larena Sox, MD      . diphenhydrAMINE (BENADRYL) capsule 50 mg  50 mg Oral Daily Verne Spurr, PA-C   50 mg at 10/01/12 0813   Or  . diphenhydrAMINE (BENADRYL) injection 50 mg  50 mg Intramuscular Daily Verne Spurr, PA-C      . haloperidol (HALDOL) tablet 10 mg  10 mg Oral BID Verne Spurr, PA-C       Or  . haloperidol lactate (HALDOL) injection 10 mg  10 mg Intramuscular BID Verne Spurr, PA-C      . haloperidol (HALDOL) tablet 5 mg  5 mg Oral Q6H PRN Larena Sox, MD       Or  . haloperidol lactate (HALDOL) injection 5 mg  5 mg Intramuscular Q6H PRN Larena Sox, MD      . LORazepam (ATIVAN) injection 2 mg  2 mg Intramuscular Daily Verne Spurr, PA-C      . LORazepam (ATIVAN) tablet 2 mg  2 mg Oral Daily Mojeed Akintayo   2 mg  at 10/01/12 0813   Or  . LORazepam (ATIVAN) injection 2 mg  2 mg Intramuscular Daily Mojeed Akintayo      . magnesium hydroxide (MILK OF MAGNESIA) suspension 30 mL  30 mL Oral Daily PRN Kerry Hough, PA        Lab Results: No results found for this or any previous visit (from the past 48 hour(s)).  Physical Findings: AIMS: Facial and Oral Movements Muscles of Facial Expression: None, normal Lips and Perioral Area: None, normal Jaw: None, normal Tongue: None, normal,Extremity Movements Upper (arms, wrists, hands, fingers): None, normal Lower (legs, knees, ankles, toes): None, normal, Trunk Movements Neck, shoulders, hips: None, normal, Overall Severity Severity of abnormal movements (highest score from questions above): None, normal Incapacitation due to abnormal  movements: None, normal Patient's awareness of abnormal movements (rate only patient's report): No Awareness, Dental Status Current problems with teeth and/or dentures?: No Does patient usually wear dentures?: No  CIWA:  CIWA-Ar Total: 0  COWS:  COWS Total Score: 2   Treatment Plan Summary: Daily contact with patient to assess and evaluate symptoms and progress in treatment Medication management  Plan: 1. Continue crisis management and stabilization. 2. Medication management to reduce current symptoms to base line and improve the patient's overall level of functioning. -will continue force medications today. Haldol 10 mg today and increase to 10mg  po BID. -Will continue with diphenhydramine 50mg  po qd, and Ativan 2mg  po qd for psychosis. May use IM if uncooperative. 3. Treat health problems as indicated. 4. Develop treatment plan to decrease risk of relapse upon discharge and the need for readmission. 5. Psycho-social education regarding relapse prevention and self care. 6. Health care follow up as needed for medical problems. 7. She is currently on the [facility] waiting list.  ELOS: 3-5 days depending on response to treatment and bed availability at [facility]. 8. Tachycardia is new, EKG is reviewed and patient refuses manual pulse check. If continues will consult IM.   Medical Decision Making Problem Points:  Established problem, stable/improving (1), New problem, with no additional work-up planned (3) and Review of psycho-social stressors (1) Data Points:  Review or order medicine tests (1) Review of medication regiment & side effects (2)  I certify that inpatient services furnished can reasonably be expected to improve the patient's condition.  Rona Ravens. Jung Yurchak PAC 10/01/2012, 1:22 PM

## 2012-10-01 NOTE — Progress Notes (Signed)
Pt. Was in the bed when writer arrived.  Writer checked on the pt. Several times and she would not respond to me.  Writer finally insisted that the pt. Speak.  Pt.'s response when ask if she was having any pain or any needs, "Well I don't guess I do if I'm asleep".   No complaints of pain or discomfort noted.  Pt. Would not respond to any more of writers questions.

## 2012-10-01 NOTE — Progress Notes (Signed)
Pt did not fill out pt self inventory sheet, denies SI/HI/AVH at this time, pt still having paranoid delusions, "I guess you have to form an alliance against me", pt states that she does not need to take medication because "I am functioning just fine without it thank you."  Pt has glaring eye contact and seems to get easily agitated by staff and other pts, Pt refused meds but after staff explained that meds would have to be forced, pt complied and took them by mouth.  Encouraged pt to continue with treatment plan, dinamap read 120 for pt HR, pt refused to have manual taken, MD notified.

## 2012-10-02 NOTE — Progress Notes (Signed)
Patient ID: Valerie Howard, female   DOB: [birth year], 37 y.o.   MRN: [id number] [facility] MD Progress Note  10/02/2012 10:21 AM [facility]  MRN:  [id number] Subjective:  Patient remains non-complaint with her medications and has not been attending group or participate in other unit activities. She is currently under forced medication order, she remains paranoid and "angry at the world". She denies auditory/visual hallucinations, suicidal ideations, intent or plan.  Patient remains guarded, isolated and socially withdrawn from her peers.  Diagnosis:  Schizophrenia, paranoid type  ADL's:  Intact  Sleep: Fair  Appetite:  Fair  Suicidal Ideation:  denies Homicidal Ideation:  denies AEB (as evidenced by):patient's affect, verbal response when asked, and participation in unit programming. The patient continues to refuse to attend groups. Psychiatric Specialty Exam: Review of Systems  Constitutional: Negative.   HENT: Negative.   Eyes: Negative.   Respiratory: Negative.   Cardiovascular: Negative.   Gastrointestinal: Negative.   Genitourinary: Negative.   Musculoskeletal: Negative.   Skin: Negative.   Neurological: Negative.   Endo/Heme/Allergies: Negative.   Psychiatric/Behavioral: The patient is nervous/anxious.        Labile mood    Blood pressure 126/81, pulse 102, temperature 98.7 F (37.1 C), temperature source Oral, resp. rate 20, height 5\' 8"  (1.727 m), weight 65.772 kg (145 lb), last menstrual period 09/15/2012.Body mass index is 22.05 kg/(m^2).  General Appearance: casual  Eye Contact::  Minimal  Speech:  goal directed  Volume:  Increased  Mood:  irritable  Affect:  Labile  Thought Process:  Loose  Orientation:  To time, place and person  Thought Content:  Delusions  Suicidal Thoughts:  No  Homicidal Thoughts:  No  Memory:  NA  Judgement:  Impaired  Insight:  Lacking  Psychomotor Activity:  Normal  Concentration:  Poor  Recall:  Poor   Akathisia:  No  Handed:    AIMS (if indicated):     Assets:  Vocational/Educational  Sleep:  Number of Hours: 5.5    Current Medications: Current Facility-Administered Medications  Medication Dose Route Frequency Provider Last Rate Last Dose  . acetaminophen (TYLENOL) tablet 650 mg  650 mg Oral Q6H PRN Kerry Hough, PA      . alum & mag hydroxide-simeth (MAALOX/MYLANTA) 200-200-20 MG/5ML suspension 30 mL  30 mL Oral Q4H PRN Kerry Hough, PA      . benztropine (COGENTIN) tablet 1 mg  1 mg Oral BID Ranbir Chew   1 mg at 10/02/12 0811  . diphenhydrAMINE (BENADRYL) capsule 25 mg  25 mg Oral Q6H PRN Larena Sox, MD       Or  . diphenhydrAMINE (BENADRYL) injection 25 mg  25 mg Intramuscular Q6H PRN Larena Sox, MD      . diphenhydrAMINE (BENADRYL) capsule 50 mg  50 mg Oral Daily Verne Spurr, PA-C   50 mg at 10/02/12 5638   Or  . diphenhydrAMINE (BENADRYL) injection 50 mg  50 mg Intramuscular Daily Verne Spurr, PA-C      . haloperidol (HALDOL) tablet 10 mg  10 mg Oral BID Verne Spurr, PA-C   10 mg at 10/02/12 7564   Or  . haloperidol lactate (HALDOL) injection 10 mg  10 mg Intramuscular BID Verne Spurr, PA-C      . haloperidol (HALDOL) tablet 5 mg  5 mg Oral Q6H PRN Larena Sox, MD       Or  . haloperidol lactate (HALDOL) injection 5 mg  5 mg Intramuscular Q6H  PRN Larena Sox, MD      . LORazepam (ATIVAN) injection 2 mg  2 mg Intramuscular Daily Verne Spurr, PA-C      . LORazepam (ATIVAN) tablet 2 mg  2 mg Oral Daily Dann Galicia   2 mg at 10/02/12 4098   Or  . LORazepam (ATIVAN) injection 2 mg  2 mg Intramuscular Daily Abiageal Blowe      . magnesium hydroxide (MILK OF MAGNESIA) suspension 30 mL  30 mL Oral Daily PRN Kerry Hough, PA        Lab Results: No results found for this or any previous visit (from the past 48 hour(s)).  Physical Findings: AIMS: Facial and Oral Movements Muscles of Facial Expression: None, normal Lips and Perioral  Area: None, normal Jaw: None, normal Tongue: None, normal,Extremity Movements Upper (arms, wrists, hands, fingers): None, normal Lower (legs, knees, ankles, toes): None, normal, Trunk Movements Neck, shoulders, hips: None, normal, Overall Severity Severity of abnormal movements (highest score from questions above): None, normal Incapacitation due to abnormal movements: None, normal Patient's awareness of abnormal movements (rate only patient's report): No Awareness, Dental Status Current problems with teeth and/or dentures?: No Does patient usually wear dentures?: No  CIWA:  CIWA-Ar Total: 0  COWS:  COWS Total Score: 2   Treatment Plan Summary: Daily contact with patient to assess and evaluate symptoms and progress in treatment Medication management  Plan: 1. Will continue patient on current medication regimen. 2. Patient encouraged to take her medications and participates in unit milieu.  Medical Decision Making Problem Points:  Established problem, worsening (2) and Review of psycho-social stressors (1) Data Points:  Review or order medicine tests (1)  I certify that inpatient services furnished can reasonably be expected to improve the patient's condition.  Thedore Mins, MD 10/02/2012, 10:21 AM

## 2012-10-02 NOTE — Progress Notes (Signed)
Psychoeducational Group Note  Date:  10/02/2012 Time:  0930  Group Topic/Focus:  Recovery Goals:   The focus of this group is to identify appropriate goals for recovery and establish a plan to achieve them.  Participation Level: Did Not Attend  Participation Quality:  Not Applicable  Affect:  Not Applicable  Cognitive:  Not Applicable  Insight:  Not Applicable  Engagement in Group: Not Applicable  Additional Comments:  Pt refused to attend group this morning, instead choosing to stand in the hall.  Annahi Short E 10/02/2012, 1:22 PM

## 2012-10-02 NOTE — Progress Notes (Signed)
Psychoeducational Group Note  Date:  10/02/2012 Time:  2000  Group Topic/Focus:  Wrap-Up Group:   The focus of this group is to help patients review their daily goal of treatment and discuss progress on daily workbooks.  Participation Level: Did Not Attend  Participation Quality:  Not Applicable  Affect:  Not Applicable  Cognitive:  Not Applicable  Insight:  Not Applicable  Engagement in Group: Not Applicable  Additional Comments:  Pt did not attend  Christ Kick 10/02/2012, 1:04 AM

## 2012-10-02 NOTE — Progress Notes (Signed)
Pt denies SI/HI/AVH. Pt denies pain and show no s/s of distress. Pt continues to be irritable and easily agitated with staff. Pt paranoid during med administration accusing staff of telling lies. Pt reports that she doesn't need to take meds. Pt forwards little information. Pt reported that she do not feel the need to contact her family.   Medications administered as ordered per MD. Verbal support given. Pt encouraged to attend groups. 15 minute checks performed for safety. Pt given the option to receive meds PO or IM per MD orders.   Pt sits in hallway, guarded. Pt avoids staff, pt will get up and walk into room if she see staff coming towards her. Pt has rapid pressured speech.  Pt is noncompliant with treatment.

## 2012-10-02 NOTE — Progress Notes (Signed)
Psychoeducational Group Note  Date:  10/02/2012 Time:  2000  Group Topic/Focus:  Wrap-Up Group:   The focus of this group is to help patients review their daily goal of treatment and discuss progress on daily workbooks.  Participation Level: Did Not Attend  Participation Quality:  Not Applicable  Affect:  Not Applicable  Cognitive:  Not Applicable  Insight:  Not Applicable  Engagement in Group: Not Applicable  Additional Comments:  Pt did not attend.  Christ Kick 10/02/2012, 9:24 PM

## 2012-10-03 NOTE — Progress Notes (Signed)
Pt irritable this morning. Pt refused to talk to writer during medication administration. Pt is angry and speaks rudely to staff. Pt manipulates, calling staff liars. Pt turned off the lights in the room as writer tried to turn on the lights to see. Pt denies having SI/HI/AVH. Pt continues to isolate in room. When approached by staff, pt often walks in the opposite direction.  Medications administered as ordered per MD. Verbal support given. Pt encouraged to attend groups. 15 minute checks performed for safety. Shift assessment completed.  Pt avoids, minimal interaction and forwards little information.

## 2012-10-03 NOTE — Progress Notes (Signed)
Patient ID: Valerie Howard, female   DOB: [birth year], 37 y.o.   MRN: [id number] [facility] MD Progress Note  10/03/2012 3:58 PM DARLEN GLEDHILL  MRN:  [id number] Subjective:  Patient remains non-complaint with her medications and has not been attending group or participate in other unit activities. She is currently under forced medication order, she remains paranoid and "angry at the world".  Patient remains guarded, isolated and socially withdrawn from her peers.  "I'm present" I spoke at length with [facility] today and she is improving. She has a brighter affect, actually smiles, and is less guarded with me today than previously Regis continues to remain isolated from her peers.   Diagnosis:  Schizophrenia, paranoid type  ADL's:  Intact  Sleep: Fair  Appetite:  Fair  Suicidal Ideation:  denies Homicidal Ideation:  denies AEB (as evidenced by):patient's affect, verbal response when asked, and participation in unit programming. The patient continues to refuse to attend groups. Psychiatric Specialty Exam: Review of Systems  Constitutional: Negative.   HENT: Negative.   Eyes: Negative.   Respiratory: Negative.   Cardiovascular: Negative.   Gastrointestinal: Negative.   Genitourinary: Negative.   Musculoskeletal: Negative.   Skin: Negative.   Neurological: Negative.   Endo/Heme/Allergies: Negative.   Psychiatric/Behavioral: The patient is nervous/anxious.        Labile mood    Blood pressure 145/88, pulse 93, temperature 98.8 F (37.1 C), temperature source Oral, resp. rate 14, height 5\' 8"  (1.727 m), weight 65.772 kg (145 lb), last menstrual period 09/15/2012.Body mass index is 22.05 kg/(m^2).  General Appearance: casual  Eye Contact::  fair  Speech:  goal directed  Volume:  Increased  Mood:  Less irritable today than previously  Affect:  Congruent, still labile, but lessening  Thought Process:  Linear Continues to deny AVH,   Orientation:  To time, place and person   Thought Content:  Delusions denies AVH  Suicidal Thoughts:  No  Homicidal Thoughts:  No  Memory:  NA  Judgement:  Impaired  Insight:  Lacking  Psychomotor Activity:  Normal  Concentration:  Poor  Recall:  Poor  Akathisia:  No  Handed:    AIMS (if indicated):     Assets:  Vocational/Educational  Sleep:  Number of Hours: 4    Current Medications: Current Facility-Administered Medications  Medication Dose Route Frequency Provider Last Rate Last Dose  . acetaminophen (TYLENOL) tablet 650 mg  650 mg Oral Q6H PRN Kerry Hough, PA      . alum & mag hydroxide-simeth (MAALOX/MYLANTA) 200-200-20 MG/5ML suspension 30 mL  30 mL Oral Q4H PRN Kerry Hough, PA      . benztropine (COGENTIN) tablet 1 mg  1 mg Oral BID Mojeed Akintayo   1 mg at 10/03/12 7829  . diphenhydrAMINE (BENADRYL) capsule 25 mg  25 mg Oral Q6H PRN Larena Sox, MD       Or  . diphenhydrAMINE (BENADRYL) injection 25 mg  25 mg Intramuscular Q6H PRN Larena Sox, MD      . diphenhydrAMINE (BENADRYL) capsule 50 mg  50 mg Oral Daily Verne Spurr, PA-C   50 mg at 10/03/12 5621   Or  . diphenhydrAMINE (BENADRYL) injection 50 mg  50 mg Intramuscular Daily Verne Spurr, PA-C      . haloperidol (HALDOL) tablet 10 mg  10 mg Oral BID Verne Spurr, PA-C   10 mg at 10/03/12 0830   Or  . haloperidol lactate (HALDOL) injection 10 mg  10 mg Intramuscular  BID Verne Spurr, PA-C      . haloperidol (HALDOL) tablet 5 mg  5 mg Oral Q6H PRN Larena Sox, MD       Or  . haloperidol lactate (HALDOL) injection 5 mg  5 mg Intramuscular Q6H PRN Larena Sox, MD      . LORazepam (ATIVAN) injection 2 mg  2 mg Intramuscular Daily Verne Spurr, PA-C      . LORazepam (ATIVAN) tablet 2 mg  2 mg Oral Daily Mojeed Akintayo   2 mg at 10/03/12 0831   Or  . LORazepam (ATIVAN) injection 2 mg  2 mg Intramuscular Daily Mojeed Akintayo      . magnesium hydroxide (MILK OF MAGNESIA) suspension 30 mL  30 mL Oral Daily PRN Kerry Hough,  PA        Lab Results: No results found for this or any previous visit (from the past 48 hour(s)).  Physical Findings: AIMS: Facial and Oral Movements Muscles of Facial Expression: None, normal Lips and Perioral Area: None, normal Jaw: None, normal Tongue: None, normal,Extremity Movements Upper (arms, wrists, hands, fingers): None, normal Lower (legs, knees, ankles, toes): None, normal, Trunk Movements Neck, shoulders, hips: None, normal, Overall Severity Severity of abnormal movements (highest score from questions above): None, normal Incapacitation due to abnormal movements: None, normal Patient's awareness of abnormal movements (rate only patient's report): No Awareness, Dental Status Current problems with teeth and/or dentures?: No Does patient usually wear dentures?: No  CIWA:  CIWA-Ar Total: 0  COWS:  COWS Total Score: 2   Treatment Plan Summary: Daily contact with patient to assess and evaluate symptoms and progress in treatment Medication management  Plan: 1. Haldol is increased to 10mg  po BID. 2. Patient encouraged to take her medications and participates in unit milieu. 3. Will need to discuss options for further care upon discharge as patient is improving. Medical Decision Making Problem Points:  Established problem, worsening (2) and Review of psycho-social stressors (1) Data Points:  Review or order medicine tests (1)  I certify that inpatient services furnished can reasonably be expected to improve the patient's condition.  Rona Ravens. Riyansh Gerstner RPAC 10/03/2012, 3:58 PM

## 2012-10-03 NOTE — Progress Notes (Signed)
  D: Pt observed sleeping in bed with eyes closed. RR even and unlabored. No distress noted  .  A: Q 15 minute checks were done for safety. Pt tried to be aroused. R: safety maintained on unit. Pt did not want to talk to Clinical research associate.

## 2012-10-03 NOTE — Progress Notes (Signed)
Psychoeducational Group Note  Date:  10/03/2012 Time:  2000  Group Topic/Focus:  Wrap-Up Group:   The focus of this group is to help patients review their daily goal of treatment and discuss progress on daily workbooks.  Participation Level: Did Not Attend  Participation Quality:  Not Applicable  Affect:  Not Applicable  Cognitive:  Not Applicable  Insight:  Not Applicable  Engagement in Group: Not Applicable  Additional Comments:  Pt did not attend  Christ Kick 10/03/2012, 9:14 PM

## 2012-10-03 NOTE — Progress Notes (Signed)
[facility] Group Notes:  (Counselor/Nursing/MHT/Case Management/Adjunct)  07/13/2012 12:00 PM  Type of Therapy:  Group Therapy  Participation Level:  Did Not Attend  [name] 07/13/2012, 12:00 PM  

## 2012-10-03 NOTE — Progress Notes (Signed)
[facility] LCSW Aftercare Discharge Planning Group Note  10/03/2012 12:29 PM  Participation Quality:  Pt refused to attend group.  she was directed to her room since unwilling to come to group    Daryel Gerald B 10/03/2012, 12:29 PM

## 2012-10-04 NOTE — Progress Notes (Signed)
Psychoeducational Group Note  Date:  10/04/2012 Time:  0930  Group Topic/Focus:  Rediscovering Joy:   The focus of this group is to explore various ways to relieve stress in a positive manner.  Participation Level: Did Not Attend  Participation Quality:  Not Applicable  Affect:  Not Applicable  Cognitive:  Not Applicable  Insight:  Not Applicable  Engagement in Group: Not Applicable  Additional Comments:  Pt said she rather stand in the hall and did not want to attend group.  Phylis Javed E 10/04/2012, 2:07 PM

## 2012-10-04 NOTE — Progress Notes (Signed)
[facility] Group Notes:  (Counselor/Nursing/MHT/Case Management/Adjunct)  10/04/2012 2:40 PM   Type of Therapy:  Group Therapy  Participation Level:  Did Not Attend   Smart, Ledell Peoples 07/12/2012, 2:34 PM

## 2012-10-04 NOTE — Progress Notes (Signed)
Patient ID: Valerie Howard, female   DOB: Jul 24, 1976, 37 y.o.   MRN: [id number] Patient remains with hostile affect.  She is resistant to taking her medications.  Nurse brought her medications to her this am since she refused to come to the medication window stating, "so that is how you are going to talk to me."  She refused to let her assigned nurse give her the medications stating, "I don't trust you and you can back away from me."  She did, however, let another nurse on the hall give her meds to her.  She remains irritable, pacing the hallways, and unwilling to participate in her treatment.  She is requesting to go to court today since this is the date of her hearing.  CW is not present today.  She is also drawing maps of her neighborhood to be placed in her shadow chart.  Continue to monitor medication management and MD orders.  Monitor behavioral changes and redirect and needed.  Safety checks continued every 15 minutes per protocol.

## 2012-10-04 NOTE — Progress Notes (Signed)
Pt. Is in bed and will only respond with a short answer, "I'm okay" and refuses to say anything else to Clinical research associate.  Pt. Is resting quietly with no signs of distress or discomfort noted at this time.

## 2012-10-04 NOTE — Progress Notes (Signed)
Patient ID: Valerie Howard, female   DOB: 08/24/1976, 37 y.o.   MRN: [id number] [facility] MD Progress Note  10/04/2012 4:25 PM Valerie Howard  MRN:  [id number] Subjective:  "I'm present" Valerie Howard continues to do well. States she still doesn't need to be here, doesn't need to take medications. No new complaints. Diagnosis:  Schizophrenia, paranoid type  ADL's:  Intact  Sleep: Fair  Appetite:  Fair  Suicidal Ideation:  denies Homicidal Ideation:  denies AEB (as evidenced by):patient's affect, verbal response when asked, and participation in unit programming. The patient continues to refuse to attend groups. Psychiatric Specialty Exam: Review of Systems  Constitutional: Negative.   HENT: Negative.   Eyes: Negative.   Respiratory: Negative.   Cardiovascular: Negative.   Gastrointestinal: Negative.   Genitourinary: Negative.   Musculoskeletal: Negative.   Skin: Negative.   Neurological: Negative.   Endo/Heme/Allergies: Negative.   Psychiatric/Behavioral: The patient is nervous/anxious.        Labile mood    Blood pressure 141/74, pulse 93, temperature 98.6 F (37 C), temperature source Oral, resp. rate 17, height 5\' 8"  (1.727 m), weight 65.772 kg (145 lb), last menstrual period 09/15/2012.Body mass index is 22.05 kg/(m^2).  General Appearance: casual  Eye Contact::  fair  Speech:  goal directed  Volume:  Increased  Mood:  Less irritable today than previously  Affect:  Congruent, still labile, but lessening  Thought Process:  Linear Continues to deny AVH,   Orientation:  To time, place and person  Thought Content:  Delusions denies AVH  Suicidal Thoughts:  No  Homicidal Thoughts:  No  Memory:  NA  Judgement:  Impaired  Insight:  Lacking  Psychomotor Activity:  Normal  Concentration:  Poor  Recall:  Poor  Akathisia:  No  Handed:    AIMS (if indicated):     Assets:  Vocational/Educational  Sleep:  Number of Hours: 5.5    Current Medications: Current  Facility-Administered Medications  Medication Dose Route Frequency Provider Last Rate Last Dose  . acetaminophen (TYLENOL) tablet 650 mg  650 mg Oral Q6H PRN Kerry Hough, PA      . alum & mag hydroxide-simeth (MAALOX/MYLANTA) 200-200-20 MG/5ML suspension 30 mL  30 mL Oral Q4H PRN Kerry Hough, PA      . benztropine (COGENTIN) tablet 1 mg  1 mg Oral BID Mojeed Akintayo   1 mg at 10/04/12 0807  . diphenhydrAMINE (BENADRYL) capsule 25 mg  25 mg Oral Q6H PRN Larena Sox, MD       Or  . diphenhydrAMINE (BENADRYL) injection 25 mg  25 mg Intramuscular Q6H PRN Larena Sox, MD      . diphenhydrAMINE (BENADRYL) capsule 50 mg  50 mg Oral Daily Verne Spurr, PA-C   50 mg at 10/04/12 1027   Or  . diphenhydrAMINE (BENADRYL) injection 50 mg  50 mg Intramuscular Daily Verne Spurr, PA-C      . haloperidol (HALDOL) tablet 10 mg  10 mg Oral BID Verne Spurr, PA-C   10 mg at 10/04/12 2536   Or  . haloperidol lactate (HALDOL) injection 10 mg  10 mg Intramuscular BID Verne Spurr, PA-C      . haloperidol (HALDOL) tablet 5 mg  5 mg Oral Q6H PRN Larena Sox, MD       Or  . haloperidol lactate (HALDOL) injection 5 mg  5 mg Intramuscular Q6H PRN Larena Sox, MD      . LORazepam (ATIVAN) injection 2 mg  2 mg Intramuscular Daily Verne Spurr, PA-C      . LORazepam (ATIVAN) tablet 2 mg  2 mg Oral Daily Mojeed Akintayo   2 mg at 10/04/12 8295   Or  . LORazepam (ATIVAN) injection 2 mg  2 mg Intramuscular Daily Mojeed Akintayo      . magnesium hydroxide (MILK OF MAGNESIA) suspension 30 mL  30 mL Oral Daily PRN Kerry Hough, PA        Lab Results: No results found for this or any previous visit (from the past 48 hour(s)).  Physical Findings: AIMS: Facial and Oral Movements Muscles of Facial Expression: None, normal Lips and Perioral Area: None, normal Jaw: None, normal Tongue: None, normal,Extremity Movements Upper (arms, wrists, hands, fingers): None, normal Lower (legs, knees,  ankles, toes): None, normal, Trunk Movements Neck, shoulders, hips: None, normal, Overall Severity Severity of abnormal movements (highest score from questions above): None, normal Incapacitation due to abnormal movements: None, normal Patient's awareness of abnormal movements (rate only patient's report): No Awareness, Dental Status Current problems with teeth and/or dentures?: No Does patient usually wear dentures?: No  CIWA:  CIWA-Ar Total: 0  COWS:  COWS Total Score: 2   Treatment Plan Summary: Daily contact with patient to assess and evaluate symptoms and progress in treatment Medication management  Plan: 1. Haldol is to continue at 10mg  po BID. 2. Patient encouraged to take her medications and participates in unit milieu. 3. Will need to discuss options for further care upon discharge as patient is improving but doesn't seem as well to day as yesterday. "?cheeking meds?" Medical Decision Making Problem Points:  Established problem, worsening (2) and Review of psycho-social stressors (1) Data Points:  Review or order medicine tests (1)  I certify that inpatient services furnished can reasonably be expected to improve the patient's condition.  Rona Ravens. Jaziah Kwasnik RPAC 10/04/2012, 4:25 PM

## 2012-10-04 NOTE — Progress Notes (Signed)
[facility] LCSW Aftercare Discharge Planning Group Note  10/04/2012 9:40 AM  Participation Quality: DID NOT ATTEND   Teah Votaw, Ledell Peoples 10/04/2012, 9:40 AM

## 2012-10-05 MED ORDER — HALOPERIDOL 5 MG PO TABS
15.0000 mg | ORAL_TABLET | Freq: Two times a day (BID) | ORAL | Status: DC
  Administered 2012-10-05 – 2012-10-18 (×23): 15 mg via ORAL
  Filled 2012-10-05 (×30): qty 3

## 2012-10-05 MED ORDER — HALOPERIDOL 5 MG PO TABS: 10.0000 mg | ORAL_TABLET | Freq: Two times a day (BID) | ORAL | Status: DC

## 2012-10-05 MED ORDER — DIVALPROEX SODIUM ER 500 MG PO TB24
1000.0000 mg | ORAL_TABLET | Freq: Once | ORAL | Status: DC
  Filled 2012-10-05 (×2): qty 2

## 2012-10-05 MED ORDER — DIVALPROEX SODIUM ER 500 MG PO TB24
500.0000 mg | ORAL_TABLET | Freq: Two times a day (BID) | ORAL | Status: DC
  Filled 2012-10-05: qty 1

## 2012-10-05 MED ORDER — HALOPERIDOL LACTATE 5 MG/ML IJ SOLN: 12.0000 mg | Freq: Two times a day (BID) | INTRAMUSCULAR | Status: DC

## 2012-10-05 MED ORDER — HALOPERIDOL LACTATE 5 MG/ML IJ SOLN
15.0000 mg | Freq: Two times a day (BID) | INTRAMUSCULAR | Status: DC
  Administered 2012-10-07 – 2012-10-13 (×3): 15 mg via INTRAMUSCULAR
  Filled 2012-10-05 (×30): qty 3

## 2012-10-05 NOTE — Progress Notes (Addendum)
Patient ID: Valerie Howard, female   DOB: 06/20/1976, 37 y.o.   MRN: [id number] [facility] MD Progress Note  10/05/2012 10:54 AM Valerie Howard  MRN:  [id number] Subjective:  "I'm good, I'm feeling fine." Met with Sherrelle to discuss her progress. She is asking when she can leave, states she doesn't want to be here, doesn't need medications, why are we keeping her here. Objective: She is edgier and guarded today, less cooperative with this provider.  She seems to have regressed in her progression.  Diagnosis:  Schizophrenia, paranoid type  ADL's:  Intact  Sleep: Fair  Appetite:  Fair  Suicidal Ideation:  denies Homicidal Ideation:  denies AEB (as evidenced by):patient's affect, verbal response when asked, and participation in unit programming. The patient continues to refuse to attend groups. Psychiatric Specialty Exam: Review of Systems  Unable to perform ROS: mental acuity  Constitutional: Negative.   HENT: Negative.   Eyes: Negative.   Respiratory: Negative.   Cardiovascular: Negative.   Gastrointestinal: Negative.   Genitourinary: Negative.   Musculoskeletal: Negative.   Skin: Negative.   Neurological: Negative.   Endo/Heme/Allergies: Negative.   Psychiatric/Behavioral: The patient is nervous/anxious.        Labile mood    Blood pressure 105/57, pulse 87, temperature 98.4 F (36.9 C), temperature source Oral, resp. rate 16, height 5\' 8"  (1.727 m), weight 65.772 kg (145 lb), last menstrual period 09/15/2012.Body mass index is 22.05 kg/(m^2).  General Appearance: casual  Eye Contact::  fair  Speech:  goal directed   Volume:  Increased  Mood:  Edgier and more moody today, more so than yesterday.  Affect:  Congruent, more labile today than yesterday  Thought Process:  Linear Continues to deny AVH. More guarded.  Orientation:  To time, place and person  Thought Content:  Delusions denies AVH, continues to be paranoid.  Suicidal Thoughts:  No  Homicidal  Thoughts:  No  Memory:  NA  Judgement:  Impaired  Insight:  Lacking  Psychomotor Activity:  Normal  Concentration:  Poor  Recall:  Poor  Akathisia:  No  Handed:    AIMS (if indicated):     Assets:  Vocational/Educational  Sleep:  Number of Hours: 4    Current Medications: Current Facility-Administered Medications  Medication Dose Route Frequency Provider Last Rate Last Dose  . acetaminophen (TYLENOL) tablet 650 mg  650 mg Oral Q6H PRN Kerry Hough, PA      . alum & mag hydroxide-simeth (MAALOX/MYLANTA) 200-200-20 MG/5ML suspension 30 mL  30 mL Oral Q4H PRN Kerry Hough, PA      . benztropine (COGENTIN) tablet 1 mg  1 mg Oral BID Mojeed Akintayo   1 mg at 10/05/12 0755  . diphenhydrAMINE (BENADRYL) capsule 25 mg  25 mg Oral Q6H PRN Larena Sox, MD       Or  . diphenhydrAMINE (BENADRYL) injection 25 mg  25 mg Intramuscular Q6H PRN Larena Sox, MD      . diphenhydrAMINE (BENADRYL) capsule 50 mg  50 mg Oral Daily Verne Spurr, PA-C   50 mg at 10/05/12 0755   Or  . diphenhydrAMINE (BENADRYL) injection 50 mg  50 mg Intramuscular Daily Verne Spurr, PA-C      . divalproex (DEPAKOTE ER) 24 hr tablet 1,000 mg  1,000 mg Oral Once Verne Spurr, PA-C       Followed by  . divalproex (DEPAKOTE ER) 24 hr tablet 500 mg  500 mg Oral BID Verne Spurr, PA-C      .  haloperidol (HALDOL) tablet 10 mg  10 mg Oral BID Verne Spurr, PA-C   10 mg at 10/05/12 6433   Or  . haloperidol lactate (HALDOL) injection 10 mg  10 mg Intramuscular BID Verne Spurr, PA-C      . haloperidol (HALDOL) tablet 5 mg  5 mg Oral Q6H PRN Larena Sox, MD       Or  . haloperidol lactate (HALDOL) injection 5 mg  5 mg Intramuscular Q6H PRN Larena Sox, MD      . LORazepam (ATIVAN) injection 2 mg  2 mg Intramuscular Daily Verne Spurr, PA-C      . LORazepam (ATIVAN) tablet 2 mg  2 mg Oral Daily Mojeed Akintayo   2 mg at 10/05/12 0755   Or  . LORazepam (ATIVAN) injection 2 mg  2 mg Intramuscular  Daily Mojeed Akintayo      . magnesium hydroxide (MILK OF MAGNESIA) suspension 30 mL  30 mL Oral Daily PRN Kerry Hough, PA        Lab Results: No results found for this or any previous visit (from the past 48 hour(s)).  Physical Findings: AIMS: Facial and Oral Movements Muscles of Facial Expression: None, normal Lips and Perioral Area: None, normal Jaw: None, normal Tongue: None, normal,Extremity Movements Upper (arms, wrists, hands, fingers): None, normal Lower (legs, knees, ankles, toes): None, normal, Trunk Movements Neck, shoulders, hips: None, normal, Overall Severity Severity of abnormal movements (highest score from questions above): None, normal Incapacitation due to abnormal movements: None, normal Patient's awareness of abnormal movements (rate only patient's report): No Awareness, Dental Status Current problems with teeth and/or dentures?: No Does patient usually wear dentures?: No  CIWA:  CIWA-Ar Total: 0  COWS:  COWS Total Score: 2   Treatment Plan Summary: Daily contact with patient to assess and evaluate symptoms and progress in treatment Medication management  Plan: 1. Haldol is to continue at 10mg  po BID. 2. Patient encouraged to take her medications and participates in unit milieu. 3. Will need to discuss options for further care upon discharge as patient is improving but doesn't seem as well to day as yesterday. 4. Discussed possibility of patient cheeking meds, and RN assures me that Antanasia is taking her meds. 5. Discussed with Dr. Jannifer Franklin, and he suggested a mood stabilizer such as Depakote, so will load with a 1000mg  x 1 now and 500mg  BID to start tomorrow. Patient refused PO Depakote so will increase the Haldol to 15mg  po BID to assist with mood stabilization after discussion with the Pharm D. 6. Also discussed the option of Haldol Deconate for long term control.   Medical Decision Making Problem Points:  Established problem, worsening (2) and Review of  psycho-social stressors (1) Data Points:  Review or order medicine tests (1)  I certify that inpatient services furnished can reasonably be expected to improve the patient's condition.  Rona Ravens. Orion Vandervort RPAC 10/05/2012, 10:54 AM

## 2012-10-05 NOTE — Progress Notes (Signed)
D   Pt has isolated to her room   She did come out and ask to get her laundry and was very polite and apologetic for having to bother this Clinical research associate for that   She has stayed in her room and has not requested any other medications or interventions A   Verbal support given   Reassured pt she was not bothering staff and we are here to help her  Q 15 min checks R   Pt safe at present

## 2012-10-05 NOTE — Progress Notes (Signed)
Patient ID: Valerie Howard, female   DOB: 1975-12-28, 37 y.o.   MRN: [id number] 10-05-12 @ nursing shift note: D: pt has been resistant to taking medications today. She did take her am medications per order. A: medications were taken after lots of encouragement. PA ordered depakote po. Pt refused this medication. Their was no im alternative for this medication per pharmacist, so the medication couldn't be forced. R: communicated this information with the PA; she order haldol 15 mg po or im at 1700. This medication may have to be forced. rn will monitor and q 15 min cks continue.

## 2012-10-05 NOTE — Progress Notes (Signed)
Adult Psychoeducational Group Note  Date:  10/05/2012 Time:  11:55 AM  Group Topic/Focus:  Relapse Prevention Planning:   The focus of this group is to define relapse and discuss the need for planning to combat relapse.  Participation Level:  Did Not Attend  Participation Quality:    Affect:    Cognitive:    Insight:   Engagement in Group:    Modes of Intervention:    Additional Comments:  Pt refused to attend, pt stood in the hallway talking the whole time.  Isla Pence M 10/05/2012, 11:55 AM

## 2012-10-05 NOTE — Clinical Social Work Note (Signed)
[facility] LCSW Group Therapy  10/05/2012 1:33 PM   Type of Therapy:  Group Therapy  Participation Level:  Did not attend     Summary of Progress/Problems: Today's group focused on relapse prevention.  We defined the term, and then brainstormed on ways to prevent relapse.  Daryel Gerald B 10/05/2012 , 1:33 PM

## 2012-10-06 NOTE — Progress Notes (Signed)
Psychoeducational Group Note  Date:  10/06/2012 Time:0930am  Group Topic/Focus:  Identifying Needs:   The focus of this group is to help patients identify their personal needs that have been historically problematic and identify healthy behaviors to address their needs.  Participation Level:  Did Not Attend  Participation Quality:    Affect:   Cognitive:  Insight:  Engagement in Group:  Additional Comments:  Inventory group   Valente David 10/06/2012,10:15 AM

## 2012-10-06 NOTE — Progress Notes (Addendum)
Patient ID: Valerie Howard, female   DOB: [birth year], 37 y.o.   MRN: [id number] Patient ID: Valerie Howard, female   DOB: Jul 21, 1976, 37 y.o.   MRN: [id number] [facility] MD Progress Note  10/06/2012 2:42 PM SEJAL COFIELD  MRN:  [id number]  Subjective: Valerie Howard was seen and assessed in her room. She was sitting in a reclining position on a chair. She reports, "I'm okay. I'm sleeping as well as I can sleep in here. No I'm not suicidal. I'm not thinking about hurting anyone else. I am not hearing voices and seeing things. Thank you".  O: Withdrawn, guarded,  lashes out her answers.   Diagnosis:  Schizophrenia, paranoid type  ADL's:  Intact  Sleep: Fair  Appetite:  Fair  Suicidal Ideation:  denies Homicidal Ideation:  denies AEB (as evidenced by):patient's affect, verbal response when asked, and participation in unit programming. The patient continues to refuse to attend groups. Psychiatric Specialty Exam: Review of Systems  Unable to perform ROS: mental acuity  Constitutional: Negative.   HENT: Negative.   Eyes: Negative.   Respiratory: Negative.   Cardiovascular: Negative.   Gastrointestinal: Negative.   Genitourinary: Negative.   Musculoskeletal: Negative.   Skin: Negative.   Neurological: Negative.   Endo/Heme/Allergies: Negative.   Psychiatric/Behavioral: The patient is nervous/anxious.        Labile mood    Blood pressure 145/74, pulse 101, temperature 98 F (36.7 C), temperature source Oral, resp. rate 20, height 5\' 8"  (1.727 m), weight 65.772 kg (145 lb), last menstrual period 09/15/2012.Body mass index is 22.05 kg/(m^2).  General Appearance: casual  Eye Contact::  fair  Speech:  goal directed   Volume:  Increased  Mood:  Edgier and more moody today, more so than yesterday.  Affect:  Congruent, more labile today than yesterday  Thought Process:  Linear Continues to deny AVH. More guarded.  Orientation:  To time, place and person   Thought Content:  Delusions denies AVH, continues to be paranoid.  Suicidal Thoughts:  No  Homicidal Thoughts:  No  Memory:  NA  Judgement:  Impaired  Insight:  Lacking  Psychomotor Activity:  Normal  Concentration:  Poor  Recall:  Poor  Akathisia:  No  Handed:    AIMS (if indicated):     Assets:  Vocational/Educational  Sleep:  Number of Hours: 5.5   Current Medications: Current Facility-Administered Medications  Medication Dose Route Frequency Provider Last Rate Last Dose  . acetaminophen (TYLENOL) tablet 650 mg  650 mg Oral Q6H PRN Kerry Hough, PA      . alum & mag hydroxide-simeth (MAALOX/MYLANTA) 200-200-20 MG/5ML suspension 30 mL  30 mL Oral Q4H PRN Kerry Hough, PA      . benztropine (COGENTIN) tablet 1 mg  1 mg Oral BID Mojeed Akintayo   1 mg at 10/05/12 0755  . diphenhydrAMINE (BENADRYL) capsule 25 mg  25 mg Oral Q6H PRN Larena Sox, MD       Or  . diphenhydrAMINE (BENADRYL) injection 25 mg  25 mg Intramuscular Q6H PRN Larena Sox, MD      . diphenhydrAMINE (BENADRYL) capsule 50 mg  50 mg Oral Daily Verne Spurr, PA-C   50 mg at 10/05/12 0755   Or  . diphenhydrAMINE (BENADRYL) injection 50 mg  50 mg Intramuscular Daily Verne Spurr, PA-C      . divalproex (DEPAKOTE ER) 24 hr tablet 1,000 mg  1,000 mg Oral Once Verne Spurr, PA-C  Followed by  . divalproex (DEPAKOTE ER) 24 hr tablet 500 mg  500 mg Oral BID Verne Spurr, PA-C      . haloperidol (HALDOL) tablet 10 mg  10 mg Oral BID Verne Spurr, PA-C   10 mg at 10/05/12 1610   Or  . haloperidol lactate (HALDOL) injection 10 mg  10 mg Intramuscular BID Verne Spurr, PA-C      . haloperidol (HALDOL) tablet 5 mg  5 mg Oral Q6H PRN Larena Sox, MD       Or  . haloperidol lactate (HALDOL) injection 5 mg  5 mg Intramuscular Q6H PRN Larena Sox, MD      . LORazepam (ATIVAN) injection 2 mg  2 mg Intramuscular Daily Verne Spurr, PA-C      . LORazepam (ATIVAN) tablet 2 mg  2 mg Oral Daily  Mojeed Akintayo   2 mg at 10/05/12 0755   Or  . LORazepam (ATIVAN) injection 2 mg  2 mg Intramuscular Daily Mojeed Akintayo      . magnesium hydroxide (MILK OF MAGNESIA) suspension 30 mL  30 mL Oral Daily PRN Kerry Hough, PA        Lab Results: No results found for this or any previous visit (from the past 48 hour(s)).  Physical Findings: AIMS: Facial and Oral Movements Muscles of Facial Expression: None, normal Lips and Perioral Area: None, normal Jaw: None, normal Tongue: None, normal,Extremity Movements Upper (arms, wrists, hands, fingers): None, normal Lower (legs, knees, ankles, toes): None, normal, Trunk Movements Neck, shoulders, hips: None, normal, Overall Severity Severity of abnormal movements (highest score from questions above): None, normal Incapacitation due to abnormal movements: None, normal Patient's awareness of abnormal movements (rate only patient's report): No Awareness, Dental Status Current problems with teeth and/or dentures?: No Does patient usually wear dentures?: No  CIWA:  CIWA-Ar Total: 0 COWS:  COWS Total Score: 2  Treatment Plan Summary: Daily contact with patient to assess and evaluate symptoms and progress in treatment Medication management  Plan: Supportive approach/coping skills/stabilization Encouraged out of room, participation in group sessions and application of coping skills when distressed. Will continue to monitor response to/adverse effects of medications in use to assure effectiveness. Continue to monitor mood, behavior and interaction with staff and other patients. Continue current plan of care.  Medical Decision Making Problem Points:  Established problem, worsening (2) and Review of psycho-social stressors (1) Data Points:  Review or order medicine tests (1)  I certify that inpatient services furnished can reasonably be expected to improve the patient's condition.  Armandina Stammer, I. FNP, PMHNP-BC 10/06/2012, 2:42 PM

## 2012-10-06 NOTE — Clinical Social Work Note (Signed)
[facility] Group Notes: (Clinical Social Work)   10/06/2012      Type of Therapy:  Group Therapy   Participation Level:  Did Not Attend    Ambrose Mantle, LCSW 10/06/2012, 1:11 PM

## 2012-10-06 NOTE — Progress Notes (Signed)
Patient ID: ANIVEA VELASQUES, female   DOB: 04/25/1976, 37 y.o.   MRN: [id number] 10-06-12 nursing shift note: D: pt continues to be resistant to taking scheduled medications. A; assigned rn attempted to administer her medications and she refused. Continuing to encourage and support. R: assigned rn's partner on the unit was successful in administering her am medications. rn will monitor and q 15 min cks continue.

## 2012-10-06 NOTE — Progress Notes (Signed)
Psychoeducational Group Note  Date:  10/06/2012 Time:  0945 am  Group Topic/Focus:  Identifying Needs:   The focus of this group is to help patients identify their personal needs that have been historically problematic and identify healthy behaviors to address their needs.  Participation Level:  Did Not Attend   [name] 10/06/2012,12:02 PM

## 2012-10-07 NOTE — Progress Notes (Signed)
Patient ID: LASHAWNDRA LAMPKINS, female   DOB: 1976-01-19, 37 y.o.   MRN: [id number] Psychoeducational Group Note  Date:  10/07/2012 Time:  0930am  Group Topic/Focus:  Making Healthy Choices:   The focus of this group is to help patients identify negative/unhealthy choices they were using prior to admission and identify positive/healthier coping strategies to replace them upon discharge.  Participation Level:  Did Not Attend  Participation Quality:    Affect:   Cognitive:  Insight:  Engagement in Group:  Additional Comments:  Inventory group- pt refused   Valente David 10/07/2012,9:52 AM

## 2012-10-07 NOTE — Progress Notes (Signed)
[facility] Group Notes:  (Nursing/MHT/Case Management/Adjunct)  Date:  10/07/2012  Time:  2:36 PM  Type of Therapy: MHT Group  Participation Level  Did not attend  Summary of Progress/Problems:  Valerie Howard 10/07/2012, 2:36 PM

## 2012-10-07 NOTE — Progress Notes (Signed)
Psychoeducational Group Note  Date:  10/07/2012 Time:  0945 am  Group Topic/Focus:  Making Healthy Choices:   The focus of this group is to help patients identify negative/unhealthy choices they were using prior to admission and identify positive/healthier coping strategies to replace them upon discharge.  Participation Level:  Minimal  Participation Quality:  Appropriate  Affect:  Blunted  Cognitive:  Alert  Insight:  Lacking  Engagement in Group:  Lacking  Additional Comments:    [name] 10/07/2012, 10:29 AM

## 2012-10-07 NOTE — Progress Notes (Signed)
Patient ID: Valerie Howard, female   DOB: [birth year], 37 y.o.   MRN: [id number] Patient ID: Valerie Howard, female   DOB: [birth year], 37 y.o.   MRN: [id number] Patient ID: Valerie Howard, female   DOB: 02/13/1976, 37 y.o.   MRN: [id number] [facility] MD Progress Note  10/07/2012 1:51 PM Valerie Howard  MRN:  [id number]  Subjective: "I'm doing fine, thank you. I wished that you all had camera in here to watch and see what I do all day".  O: Withdrawn, guarded. Observed walking up and down the hall way.   Diagnosis:  Schizophrenia, paranoid type  ADL's:  Intact  Sleep: Fair  Appetite:  Fair  Suicidal Ideation:  denies Homicidal Ideation:  denies AEB (as evidenced by):patient's affect, verbal response when asked, and participation in unit programming. The patient continues to refuse to attend groups. Psychiatric Specialty Exam: Review of Systems  Unable to perform ROS: mental acuity  Constitutional: Negative.   HENT: Negative.   Eyes: Negative.   Respiratory: Negative.   Cardiovascular: Negative.   Gastrointestinal: Negative.   Genitourinary: Negative.   Musculoskeletal: Negative.   Skin: Negative.   Neurological: Negative.   Endo/Heme/Allergies: Negative.   Psychiatric/Behavioral: The patient is nervous/anxious.        Labile mood    Blood pressure 122/75, pulse 94, temperature 98.5 F (36.9 C), temperature source Oral, resp. rate 20, height 5\' 8"  (1.727 m), weight 65.772 kg (145 lb), last menstrual period 09/15/2012.Body mass index is 22.05 kg/(m^2).  General Appearance: casual  Eye Contact::  fair  Speech:  goal directed   Volume:  Increased  Mood:  Edgier and more moody today, more so than yesterday.  Affect:  Congruent, more labile today than yesterday  Thought Process:  Linear Continues to deny AVH. More guarded.  Orientation:  To time, place and person  Thought Content:  Delusions denies AVH, continues to be paranoid.  Suicidal  Thoughts:  No  Homicidal Thoughts:  No  Memory:  NA  Judgement:  Impaired  Insight:  Lacking  Psychomotor Activity:  Normal  Concentration:  Poor  Recall:  Poor  Akathisia:  No  Handed:    AIMS (if indicated):     Assets:  Vocational/Educational  Sleep:  Number of Hours: 1.5   Current Medications: Current Facility-Administered Medications  Medication Dose Route Frequency Provider Last Rate Last Dose  . acetaminophen (TYLENOL) tablet 650 mg  650 mg Oral Q6H PRN Valerie Hough, PA      . alum & mag hydroxide-simeth (MAALOX/MYLANTA) 200-200-20 MG/5ML suspension 30 mL  30 mL Oral Q4H PRN Valerie Hough, PA      . benztropine (COGENTIN) tablet 1 mg  1 mg Oral BID Valerie Howard   1 mg at 10/05/12 0755  . diphenhydrAMINE (BENADRYL) capsule 25 mg  25 mg Oral Q6H PRN Valerie Sox, MD       Or  . diphenhydrAMINE (BENADRYL) injection 25 mg  25 mg Intramuscular Q6H PRN Valerie Sox, MD      . diphenhydrAMINE (BENADRYL) capsule 50 mg  50 mg Oral Daily Valerie Spurr, Valerie Howard   50 mg at 10/05/12 0755   Or  . diphenhydrAMINE (BENADRYL) injection 50 mg  50 mg Intramuscular Daily Valerie Spurr, Valerie Howard      . divalproex (DEPAKOTE ER) 24 hr tablet 1,000 mg  1,000 mg Oral Once Valerie Spurr, Valerie Howard       Followed by  . divalproex (DEPAKOTE ER) 24 hr tablet 500  mg  500 mg Oral BID Valerie Spurr, Valerie Howard      . haloperidol (HALDOL) tablet 10 mg  10 mg Oral BID Valerie Spurr, Valerie Howard   10 mg at 10/05/12 8413   Or  . haloperidol lactate (HALDOL) injection 10 mg  10 mg Intramuscular BID Valerie Spurr, Valerie Howard      . haloperidol (HALDOL) tablet 5 mg  5 mg Oral Q6H PRN Valerie Sox, MD       Or  . haloperidol lactate (HALDOL) injection 5 mg  5 mg Intramuscular Q6H PRN Valerie Sox, MD      . LORazepam (ATIVAN) injection 2 mg  2 mg Intramuscular Daily Valerie Spurr, Valerie Howard      . LORazepam (ATIVAN) tablet 2 mg  2 mg Oral Daily Valerie Howard   2 mg at 10/05/12 0755   Or  . LORazepam (ATIVAN) injection 2  mg  2 mg Intramuscular Daily Valerie Howard      . magnesium hydroxide (MILK OF MAGNESIA) suspension 30 mL  30 mL Oral Daily PRN Valerie Hough, PA        Lab Results: No results found for this or any previous visit (from the past 48 hour(s)).  Physical Findings: AIMS: Facial and Oral Movements Muscles of Facial Expression: None, normal Lips and Perioral Area: None, normal Jaw: None, normal Tongue: None, normal,Extremity Movements Upper (arms, wrists, hands, fingers): None, normal Lower (legs, knees, ankles, toes): None, normal, Trunk Movements Neck, shoulders, hips: None, normal, Overall Severity Severity of abnormal movements (highest score from questions above): None, normal Incapacitation due to abnormal movements: None, normal Patient's awareness of abnormal movements (rate only patient's report): No Awareness, Dental Status Current problems with teeth and/or dentures?: No Does patient usually wear dentures?: No  CIWA:  CIWA-Ar Total: 0 COWS:  COWS Total Score: 2  Treatment Plan Summary: Daily contact with patient to assess and evaluate symptoms and progress in treatment Medication management  Plan: Supportive approach/coping skills/stabilization Encouraged out of room, participation in group sessions and application of coping skills when distressed. Will continue to monitor response to/adverse effects of medications in use to assure effectiveness. Continue to monitor mood, behavior and interaction with staff and other patients. Continue current plan of care.  Medical Decision Making Problem Points:  Established problem, worsening (2) and Review of psycho-social stressors (1) Data Points:  Review or order medicine tests (1)  I certify that inpatient services furnished can reasonably be expected to improve the patient's condition.  Armandina Stammer, I. FNP, PMHNP-BC 10/07/2012, 1:51 PM

## 2012-10-07 NOTE — Progress Notes (Signed)
Patient ID: Valerie Howard, female   DOB: February 25, 1976, 37 y.o.   MRN: [id number] 02-09-147 nursing shift note: D: continues to be resistant to taking her medications. She has been irritable this am. She has been pacing up and down the hall. A: assigned RN had other staff administer this pt medication. R: she took the medication but was irritable and angry. He behavior has been consistent throughout her stay at [facility]. RN will monitor and Q 15 min ck's continue.

## 2012-10-07 NOTE — Clinical Social Work Note (Signed)
[facility] Group Notes: (Clinical Social Work)   10/07/2012      Type of Therapy:  Group Therapy   Participation Level:  Did Not Attend    Ambrose Mantle, LCSW 10/07/2012, 12:50 PM

## 2012-10-07 NOTE — Progress Notes (Signed)
Patient ID: Valerie Howard, female   DOB: 05-08-1976, 37 y.o.   MRN: [id number] D. The patient spent the evening in her room. Irritable when staff attempted to engage her in a conversation. Speech is rapid. Sarcasm noted   A. 1:1 conversation attempted. Encouraged to attend evening wrap up group.  R. Asked staff in a sarcastic tone to leave her alone and leave her room. Did not attend evening group.

## 2012-10-08 NOTE — Progress Notes (Signed)
Psychoeducational Group Note  Date:  10/08/2012 Time:  2005 Group Topic/Focus:  Wrap-Up Group:   The focus of this group is to help patients review their daily goal of treatment and discuss progress on daily workbooks.  Participation Level: Did Not Attend  Participation Quality:  Not Applicable  Affect:  Not Applicable  Cognitive:  Not Applicable  Insight:  Not Applicable  Engagement in Group: Not Applicable  Additional Comments:  Pt. Refused to attend group  [name] 10/08/2012, 11:01 PM

## 2012-10-08 NOTE — Progress Notes (Signed)
Patient ID: Valerie Howard, female   DOB: 1975-10-13, 37 y.o.   MRN: [id number] D. The patient spent time in the dayroom watching TV. No interaction in the milieu. Angry mood and affect. Would not make eye contact when staff attempted to speak with her. When evening group began, she left the dayroom and returned to her room.  A. Several attempts made to interface with patient. Encouraged her to attend evening wrap up group. R. The patient did not attend group. Will continue to monitor for safety.

## 2012-10-08 NOTE — Clinical Social Work Note (Signed)
  Type of Therapy: Process Group Therapy  Participation Level:  Did Not Attend    Modes of Intervention:  Activity, Clarification, Education, Problem-solving and Support  Summary of Progress/Problems: Today's group addressed the issue of overcoming obstacles.  Patients were asked to identify their biggest obstacle post d/c that stands in the way of their on-going success, and then problem solve as to how to manage this.       Daryel Gerald B 10/08/2012   3:12 PM

## 2012-10-08 NOTE — Progress Notes (Signed)
Patient ID: Valerie Howard, female   DOB: [birth year], 37 y.o.   MRN: [id number] [facility] MD Progress Note  10/08/2012 11:37 AM Valerie Howard  MRN:  [id number]  Subjective: "I'm present doing ok." Met with Tory today to discuss her progress. She wants to know when she is leaving. She is still guarded and paranoid, but does give me two names to call and get collateral information from as well as her blog site.  O: Withdrawn, guarded. Continues to isolate in her room  Diagnosis:  Schizophrenia, paranoid type  ADL's:  Intact  Sleep: Fair  Appetite:  Fair  Suicidal Ideation:  denies Homicidal Ideation:  denies AEB (as evidenced by):patient's affect, verbal response when asked, and participation in unit programming. The patient continues to refuse to attend groups. Psychiatric Specialty Exam: Review of Systems  Unable to perform ROS: mental acuity  Constitutional: Negative.   HENT: Negative.   Eyes: Negative.   Respiratory: Negative.   Cardiovascular: Negative.   Gastrointestinal: Negative.   Genitourinary: Negative.   Musculoskeletal: Negative.   Skin: Negative.   Neurological: Negative.   Endo/Heme/Allergies: Negative.   Psychiatric/Behavioral: The patient is nervous/anxious.        Labile mood    Blood pressure 130/85, pulse 101, temperature 98.5 F (36.9 C), temperature source Oral, resp. rate 18, height 5\' 8"  (1.727 m), weight 65.772 kg (145 lb), last menstrual period 09/15/2012.Body mass index is 22.05 kg/(m^2).  General Appearance: casual  Eye Contact::  fair  Speech:  goal directed   Volume:  Increased  Mood:  More congenial today. Less edgy.  Affect:  Congruent, not as labile today  Thought Process:  Linear Continues to deny AVH. More guarded.  Orientation:  To time, place and person  Thought Content:  Delusions denies AVH, continues to be paranoid.  Suicidal Thoughts:  No  Homicidal Thoughts:  No  Memory:  NA  Judgement:  Impaired  Insight:   Lacking  Psychomotor Activity:  Normal  Concentration:  Poor  Recall:  Poor  Akathisia:  No  Handed:    AIMS (if indicated):     Assets:  Vocational/Educational  Sleep:  Number of Hours: 4.75   Current Medications: Current Facility-Administered Medications  Medication Dose Route Frequency Provider Last Rate Last Dose  . acetaminophen (TYLENOL) tablet 650 mg  650 mg Oral Q6H PRN Kerry Hough, PA      . alum & mag hydroxide-simeth (MAALOX/MYLANTA) 200-200-20 MG/5ML suspension 30 mL  30 mL Oral Q4H PRN Kerry Hough, PA      . benztropine (COGENTIN) tablet 1 mg  1 mg Oral BID Mojeed Akintayo   1 mg at 10/05/12 0755  . diphenhydrAMINE (BENADRYL) capsule 25 mg  25 mg Oral Q6H PRN Larena Sox, MD       Or  . diphenhydrAMINE (BENADRYL) injection 25 mg  25 mg Intramuscular Q6H PRN Larena Sox, MD      . diphenhydrAMINE (BENADRYL) capsule 50 mg  50 mg Oral Daily Verne Spurr, PA-C   50 mg at 10/05/12 0755   Or  . diphenhydrAMINE (BENADRYL) injection 50 mg  50 mg Intramuscular Daily Verne Spurr, PA-C      . divalproex (DEPAKOTE ER) 24 hr tablet 1,000 mg  1,000 mg Oral Once Verne Spurr, PA-C       Followed by  . divalproex (DEPAKOTE ER) 24 hr tablet 500 mg  500 mg Oral BID Verne Spurr, PA-C      . haloperidol (HALDOL) tablet  10 mg  10 mg Oral BID Verne Spurr, PA-C   10 mg at 10/05/12 1610   Or  . haloperidol lactate (HALDOL) injection 10 mg  10 mg Intramuscular BID Verne Spurr, PA-C      . haloperidol (HALDOL) tablet 5 mg  5 mg Oral Q6H PRN Larena Sox, MD       Or  . haloperidol lactate (HALDOL) injection 5 mg  5 mg Intramuscular Q6H PRN Larena Sox, MD      . LORazepam (ATIVAN) injection 2 mg  2 mg Intramuscular Daily Verne Spurr, PA-C      . LORazepam (ATIVAN) tablet 2 mg  2 mg Oral Daily Mojeed Akintayo   2 mg at 10/05/12 0755   Or  . LORazepam (ATIVAN) injection 2 mg  2 mg Intramuscular Daily Mojeed Akintayo      . magnesium hydroxide (MILK OF  MAGNESIA) suspension 30 mL  30 mL Oral Daily PRN Kerry Hough, PA        Lab Results: No results found for this or any previous visit (from the past 48 hour(s)).  Physical Findings: AIMS: Facial and Oral Movements Muscles of Facial Expression: None, normal Lips and Perioral Area: None, normal Jaw: None, normal Tongue: None, normal,Extremity Movements Upper (arms, wrists, hands, fingers): None, normal Lower (legs, knees, ankles, toes): None, normal, Trunk Movements Neck, shoulders, hips: None, normal, Overall Severity Severity of abnormal movements (highest score from questions above): None, normal Incapacitation due to abnormal movements: None, normal Patient's awareness of abnormal movements (rate only patient's report): No Awareness, Dental Status Current problems with teeth and/or dentures?: No Does patient usually wear dentures?: No  CIWA:  CIWA-Ar Total: 0 COWS:  COWS Total Score: 2  Treatment Plan Summary: Daily contact with patient to assess and evaluate symptoms and progress in treatment Medication management  Plan: Supportive approach/coping skills/stabilization Encouraged out of room, participation in group sessions and application of coping skills when distressed. Will continue to monitor response to/adverse effects of medications in use to assure effectiveness. Continue to monitor mood, behavior and interaction with staff and other patients. Continue current plan of care.  Medical Decision Making Problem Points:  Established problem, worsening (2) and Review of psycho-social stressors (1) Data Points:  Review or order medicine tests (1)  I certify that inpatient services furnished can reasonably be expected to improve the patient's condition.   Rona Ravens. Celese Banner PAC 10/08/2012, 11:37 AM

## 2012-10-08 NOTE — Progress Notes (Signed)
Recreation Therapy Notes  10/08/2012         Time: 9:30am       Group Topic/Focus: Exercise Education/Wellness   Participation Level: Did not attend    [employer], LRT/CTRS Jearl Klinefelter 10/08/2012 12:04 PM

## 2012-10-08 NOTE — Treatment Plan (Signed)
Interdisciplinary Treatment Plan Update (Adult)  Date: 10/08/2012  Time Reviewed: 3:06 PM   Progress in Treatment: Attending groups: No Participating in groups: No Taking medication as prescribed: Yes Tolerating medication: Yes  Family/Significant other contact made:  No Patient understands diagnosis:  No insight Discussing patient identified problems/goals with staff:  No  She believes she has no problems  Medical problems stabilized or resolved:  Yes Denies suicidal/homicidal ideation: Yes  In tx team Issues/concerns per patient self-inventory:  Not filled out Other:  New problem(s) identified: N/A  Reason for Continuation of Hospitalization: Delusions  Hallucinations  Interventions implemented related to continuation of hospitalization: Second opinion to force medication obtained-Haldol trial with haldol IM ordered if she refuses  Also, ativan IM to be given if she refuses PO  Is consitently refusing ativan PO, and periodically refusing haldol PO  Encourage use of medications,  Encourage group attendance and participation  Additional comments: Pt is stating she will not go to groups, and is only taking medication under duress  Estimated length of stay: until a bed becomes available at [facility], or she becomes asymptomatic enough that her husband is willing to take her home  Discharge Plan: see above  New goal(s): N/A  Review of initial/current patient goals per problem list:   1.  Goal(s):Eliminate psychosis/delusions through the use of medications/therapeutic milieu  Met:  No  Target date:2/14  As evidenced by: Paranoia, delusions, anxiety, refusal to engage in therapy or the therapeutic milieu  2.  Goal (s): Transfer to [facility]  Met:  No  Target date:2/14  As evidenced by: confirmation of bed  3.  Goal(s):  Met:  No  Target date:  As evidenced by:  4.  Goal(s):  Met:  No  Target date:  As evidenced by:  Attendees: Patient:     Family:     Physician:   Thedore Mins 10/08/2012 3:06 PM   Nursing:  Novella Rob  10/08/2012 3:06 PM   Clinical Social Worker:  Richelle Ito 10/08/2012 3:06 PM   Extender:  Verne Spurr PA 10/08/2012 3:06 PM   Other:     Other:     Other:     Other:      Scribe for Treatment Team:   [name], 10/08/2012 3:06 PM

## 2012-10-08 NOTE — Progress Notes (Signed)
[facility] LCSW Aftercare Discharge Planning Group Note  10/08/2012 3:12 PM  Participation Quality:  Did not attend despite invitation    Valerie Howard 10/08/2012, 3:12 PM

## 2012-10-09 DIAGNOSIS — F312 Bipolar disorder, current episode manic severe with psychotic features: Secondary | ICD-10-CM

## 2012-10-09 MED ORDER — OXCARBAZEPINE 150 MG PO TABS
150.0000 mg | ORAL_TABLET | Freq: Two times a day (BID) | ORAL | Status: DC
  Filled 2012-10-09 (×5): qty 1

## 2012-10-09 NOTE — Progress Notes (Signed)
Psychoeducational Group Note  Date:  10/09/2012 Time:  2000  Group Topic/Focus:  Wrap-Up Group:   The focus of this group is to help patients review their daily goal of treatment and discuss progress on daily workbooks.  Participation Level: Did Not Attend  Participation Quality:  Not Applicable  Affect:  Not Applicable  Cognitive:  Not Applicable  Insight:  Not Applicable  Engagement in Group: Not Applicable  Additional Comments:  Pt did not attend.  Valerie Howard 10/09/2012, 9:06 PM d

## 2012-10-09 NOTE — Progress Notes (Signed)
Patient ID: Valerie Howard, female   DOB: Feb 11, 1976, 37 y.o.   MRN: [id number] [facility] MD Progress Note  10/09/2012 10:46 AM Valerie Howard  MRN:  [id number]  Subjective:  Patient remains hostile, labile, easily agitated, demanding, verbally aggressive towards the staffs and delusional. She is partially compliant with treatment offered to her, refusing to attend group meeting and isolates herself from her peers. She continues to verbalize that everyone including her family and providers are against her.  Diagnosis:  Schizophrenia, paranoid type  ADL's:  Intact  Sleep: Fair  Appetite:  Fair  Suicidal Ideation:  denies Homicidal Ideation:  denies AEB (as evidenced by):patient's affect, verbal response when asked, and participation in unit programming. The patient continues to refuse to attend groups. Psychiatric Specialty Exam: Review of Systems  Constitutional: Negative.   HENT: Negative.   Eyes: Negative.   Respiratory: Negative.   Cardiovascular: Negative.   Gastrointestinal: Negative.   Genitourinary: Negative.   Musculoskeletal: Negative.   Skin: Negative.   Neurological: Negative.   Endo/Heme/Allergies: Negative.   Psychiatric/Behavioral:       Labile mood, agitated    Blood pressure 144/84, pulse 109, temperature 98.4 F (36.9 C), temperature source Oral, resp. rate 20, height 5\' 8"  (1.727 m), weight 65.772 kg (145 lb), last menstrual period 09/15/2012.Body mass index is 22.05 kg/(m^2).  General Appearance: casual  Eye Contact::  Minimal  Speech:  goal directed  Volume:  Increased  Mood:  irritable  Affect:  Labile  Thought Process:  Loose  Orientation:  To time, place and person  Thought Content:  Delusions  Suicidal Thoughts:  No  Homicidal Thoughts:  No  Memory:  NA  Judgement:  Impaired  Insight:  Lacking  Psychomotor Activity:  Normal  Concentration:  Poor  Recall:  Poor  Akathisia:  No  Handed:    AIMS (if indicated):     Assets:   Vocational/Educational  Sleep:  Number of Hours: 4.5   Current Medications: Current Facility-Administered Medications  Medication Dose Route Frequency Provider Last Rate Last Dose  . acetaminophen (TYLENOL) tablet 650 mg  650 mg Oral Q6H PRN Kerry Hough, PA      . alum & mag hydroxide-simeth (MAALOX/MYLANTA) 200-200-20 MG/5ML suspension 30 mL  30 mL Oral Q4H PRN Kerry Hough, PA      . benztropine (COGENTIN) tablet 1 mg  1 mg Oral BID Creek Gan   1 mg at 10/09/12 0856  . diphenhydrAMINE (BENADRYL) capsule 25 mg  25 mg Oral Q6H PRN Larena Sox, MD       Or  . diphenhydrAMINE (BENADRYL) injection 25 mg  25 mg Intramuscular Q6H PRN Larena Sox, MD      . haloperidol lactate (HALDOL) injection 15 mg  15 mg Intramuscular BID Verne Spurr, PA-C   15 mg at 10/07/12 1829   Or  . haloperidol (HALDOL) tablet 15 mg  15 mg Oral BID Verne Spurr, PA-C   15 mg at 10/09/12 0854  . haloperidol (HALDOL) tablet 5 mg  5 mg Oral Q6H PRN Larena Sox, MD       Or  . haloperidol lactate (HALDOL) injection 5 mg  5 mg Intramuscular Q6H PRN Larena Sox, MD      . LORazepam (ATIVAN) injection 2 mg  2 mg Intramuscular Daily Verne Spurr, PA-C      . LORazepam (ATIVAN) tablet 2 mg  2 mg Oral Daily Oscar Forman   2 mg at 10/09/12 4401  Or  . LORazepam (ATIVAN) injection 2 mg  2 mg Intramuscular Daily Hakiem Malizia      . magnesium hydroxide (MILK OF MAGNESIA) suspension 30 mL  30 mL Oral Daily PRN Kerry Hough, PA      . OXcarbazepine (TRILEPTAL) tablet 150 mg  150 mg Oral BID Denarius Sesler        Lab Results: No results found for this or any previous visit (from the past 48 hour(s)).  Physical Findings: AIMS: Facial and Oral Movements Muscles of Facial Expression: None, normal Lips and Perioral Area: None, normal Jaw: None, normal Tongue: None, normal,Extremity Movements Upper (arms, wrists, hands, fingers): None, normal Lower (legs, knees, ankles, toes): None,  normal, Trunk Movements Neck, shoulders, hips: None, normal, Overall Severity Severity of abnormal movements (highest score from questions above): None, normal Incapacitation due to abnormal movements: None, normal Patient's awareness of abnormal movements (rate only patient's report): No Awareness, Dental Status Current problems with teeth and/or dentures?: No Does patient usually wear dentures?: No  CIWA:  CIWA-Ar Total: 0 COWS:  COWS Total Score: 2  Treatment Plan Summary: Daily contact with patient to assess and evaluate symptoms and progress in treatment Medication management  Plan: 1. Will continue Haldol 15mg  po BID for delusions 2. Initiate Trileptal 150mg  po BID for labile mood. 3. Continue Cogentin 1mg  po BID for EPS prevention 4. Patient awaiting transfer to [facility] hospital 5. Patient encouraged to take her medications and participates in unit milieu.  Medical Decision Making Problem Points:  Established problem, worsening (2) and Review of psycho-social stressors (1) Data Points:  Review or order medicine tests (1)  I certify that inpatient services furnished can reasonably be expected to improve the patient's condition.  Thedore Mins, MD 10/09/2012, 10:46 AM

## 2012-10-09 NOTE — Progress Notes (Signed)
D:  Patient continues to be argumentative regarding medication.  She did take oral medications today after I set the cup down and backed away from her.  She also complied with mouth checks.  An order was written earlier today to start patient on Trileptal which she refused to take.  She asked for a print out of the medication.  She also asked why she was still in the hospital. A:  Medications given as ordered with mouth checks.  Explained the new medication to the patient, what is was, and why we were giving it to her.  Printed off information on the new medication and offered to answer any further questions the patient had.  Provider was made aware that the patient is refusing the new medication.  There is no back up injectable for this medicine at this time.  She also continues to refuse to attend groups and denies that she has any sort of mental illness.  Educated patient as to why she was in the hospital such as not willingly taking medications, having no clear safe discharge plan, and that we are awaiting a bed for her at [facility].  R:  Patient continues to refuse Trileptal.  States she has no further questions about the medication, but she does not need a mood stabelizer.  She also continues to deny the fact that she has any mental illness.  States that people told lies about her and they are conspiring together.

## 2012-10-09 NOTE — Clinical Social Work Note (Signed)
[facility] LCSW Group Therapy  10/09/2012 , 4:04 PM   Type of Therapy:  Group Therapy  Participation Level:  Refused to attend    Summary of Progress/Problems: Today's group focused on the term Diagnosis.  Participants were asked to define the term, and then pronounce whether it is a negative, positive or neutral term.  Valerie Howard 10/09/2012 , 4:04 PM

## 2012-10-09 NOTE — Progress Notes (Signed)
[facility] LCSW Aftercare Discharge Planning Group Note  10/09/2012 4:03 PM  Participation Quality:  Did not attend    Valerie Howard 10/09/2012, 4:03 PM

## 2012-10-09 NOTE — Progress Notes (Signed)
D: Pt isolated in room throughout shift and became angry whenever disturbed.  Yelled at this RN "I ain't your business".  Pt makes no eye contact, facial expression is angry.  Affect angry, mood irritable.  Speech is aggressive and sarcastic with minimal interaction.  Pt is unwilling to participate in any part of milieu routine.  Unable to assess thought process and content due to Pt refusal to converse, but Pt does deny SI/HI, AVH, and acute pain.  Was ready to contract for safety on unit after it was explained that if she was unable to guarantee her safety, 1:1 would have to be considered as an alternative.  A: Offered support and encouragement, invited Pt to join peers for group.  No medications administered.  Q15 minute safety checks maintained as per unit protocol.  R: Pt unengaged in treatment.  Safety maintained. Dion Saucier RN

## 2012-10-10 MED ORDER — VALPROIC ACID 250 MG/5ML PO SYRP
1000.0000 mg | ORAL_SOLUTION | Freq: Once | ORAL | Status: AC
  Administered 2012-10-10: 1000 mg via ORAL
  Filled 2012-10-10: qty 20

## 2012-10-10 MED ORDER — DIPHENHYDRAMINE HCL 50 MG PO CAPS
50.0000 mg | ORAL_CAPSULE | Freq: Once | ORAL | Status: AC
  Filled 2012-10-10: qty 1

## 2012-10-10 MED ORDER — VALPROIC ACID 250 MG/5ML PO SYRP
500.0000 mg | ORAL_SOLUTION | Freq: Two times a day (BID) | ORAL | Status: DC
  Administered 2012-10-11 – 2012-10-12 (×3): 500 mg via ORAL
  Filled 2012-10-10 (×5): qty 10

## 2012-10-10 MED ORDER — DIPHENHYDRAMINE HCL 50 MG/ML IJ SOLN
50.0000 mg | Freq: Once | INTRAMUSCULAR | Status: AC
  Administered 2012-10-10: 50 mg via INTRAMUSCULAR
  Filled 2012-10-10: qty 1

## 2012-10-10 MED ORDER — HALOPERIDOL DECANOATE 100 MG/ML IM SOLN
50.0000 mg | Freq: Once | INTRAMUSCULAR | Status: AC
  Administered 2012-10-10: 50 mg via INTRAMUSCULAR
  Filled 2012-10-10: qty 0.5

## 2012-10-10 NOTE — Progress Notes (Signed)
D: Patient refuses to reports any feelings of SI/HI and AVH. The patient has an irritable mood and affect. The patient is refusing to attend groups and is having minimal interaction within the milieu. The patient states that she is "mad at the MDs for giving all of these medications." Patient states that she "is fine" and that she "doesn't need any of these meds."  A: Patient given emotional support from RN. Patient encouraged to come to staff with concerns and/or questions. Patient's medication routine continued. Patient's orders and plan of care reviewed.  R: Patient remains appropriate and cooperative. Will continue to monitor patient q15 minutes for safety.

## 2012-10-10 NOTE — Progress Notes (Signed)
Psychoeducational Group Note  Date:  10/10/2012 Time: 2010  Group Topic/Focus:  Wrap-Up Group:   The focus of this group is to help patients review their daily goal of treatment and discuss progress on daily workbooks.  Participation Level: Did Not Attend  Participation Quality:  Not Applicable  Affect:  Not Applicable  Cognitive:  Not Applicable  Insight:  Not Applicable  Engagement in Group: Not Applicable  Additional Comments:    Maretta Los 10/10/2012, 11:30 PM

## 2012-10-10 NOTE — Progress Notes (Signed)
Patient ID: Valerie Howard, female   DOB: Jun 24, 1976, 37 y.o.   MRN: [id number]   D:Pt observed sleeping in bed with eyes closed. RR even and unlabored. No distress noted  A: 15 observation continues for safety. Pt was asked to come out of room to talk.   R: pt remains safe. Pt remained sleep the majority of the shift.

## 2012-10-10 NOTE — Progress Notes (Signed)
Patient ID: Valerie Howard, female   DOB: 1975-10-20, 37 y.o.   MRN: [id number] [facility] MD Progress Note  10/10/2012 10:57 AM Valerie Howard  MRN:  [id number]  Subjective:  Patient remains hostile, labile, easily agitated, demanding, verbally aggressive towards the staffs and delusional. She is resistant to medication, refuses to take oral mood stabilizers, does not attend groups, does not interact with peers, and continues to demonstrate poor insight, poor judgement. She continues to hold fast to her delusion.  "I'm sick of this place."  Diagnosis:  Schizophrenia, paranoid type  ADL's:  Intact  Sleep: Fair  Appetite:  Fair  Suicidal Ideation:  denies Homicidal Ideation:  denies AEB (as evidenced by):patient's affect, verbal response when asked, and participation in unit programming. The patient continues to refuse to attend groups. Psychiatric Specialty Exam: Review of Systems  Constitutional: Negative.   HENT: Negative.   Eyes: Negative.   Respiratory: Negative.   Cardiovascular: Negative.   Gastrointestinal: Negative.   Genitourinary: Negative.   Musculoskeletal: Negative.   Skin: Negative.   Neurological: Negative.   Endo/Heme/Allergies: Negative.   Psychiatric/Behavioral:       Labile mood, agitated    Blood pressure 104/72, pulse 103, temperature 98.6 F (37 C), temperature source Oral, resp. rate 16, height 5\' 8"  (1.727 m), weight 65.772 kg (145 lb), last menstrual period 09/15/2012.Body mass index is 22.05 kg/(m^2).  General Appearance: casual  Eye Contact::  Minimal  Speech:  goal directed  Volume:  Increased  Mood:  irritable  Affect:  Labile  Thought Process:  Loose  Orientation:  To time, place and person  Thought Content:  Delusions  Suicidal Thoughts:  No  Homicidal Thoughts:  No  Memory:  NA  Judgement:  Impaired  Insight:  Lacking  Psychomotor Activity:  Normal  Concentration:  Poor  Recall:  Poor  Akathisia:  No  Handed:    AIMS  (if indicated):     Assets:  Vocational/Educational  Sleep:  Number of Hours: 6   Current Medications: Current Facility-Administered Medications  Medication Dose Route Frequency Provider Last Rate Last Dose  . acetaminophen (TYLENOL) tablet 650 mg  650 mg Oral Q6H PRN Kerry Hough, PA      . alum & mag hydroxide-simeth (MAALOX/MYLANTA) 200-200-20 MG/5ML suspension 30 mL  30 mL Oral Q4H PRN Kerry Hough, PA      . benztropine (COGENTIN) tablet 1 mg  1 mg Oral BID Mojeed Akintayo   1 mg at 10/10/12 0748  . haloperidol lactate (HALDOL) injection 15 mg  15 mg Intramuscular BID Verne Spurr, PA-C   15 mg at 10/09/12 1705   Or  . haloperidol (HALDOL) tablet 15 mg  15 mg Oral BID Verne Spurr, PA-C   15 mg at 10/10/12 0747  . haloperidol (HALDOL) tablet 5 mg  5 mg Oral Q6H PRN Larena Sox, MD       Or  . haloperidol lactate (HALDOL) injection 5 mg  5 mg Intramuscular Q6H PRN Larena Sox, MD      . LORazepam (ATIVAN) injection 2 mg  2 mg Intramuscular Daily Verne Spurr, PA-C      . LORazepam (ATIVAN) tablet 2 mg  2 mg Oral Daily Mojeed Akintayo   2 mg at 10/10/12 0747   Or  . LORazepam (ATIVAN) injection 2 mg  2 mg Intramuscular Daily Mojeed Akintayo      . magnesium hydroxide (MILK OF MAGNESIA) suspension 30 mL  30 mL Oral Daily PRN Karleen Hampshire  E Simon, PA      . OXcarbazepine (TRILEPTAL) tablet 150 mg  150 mg Oral BID Mojeed Akintayo        Lab Results: No results found for this or any previous visit (from the past 48 hour(s)).  Physical Findings: AIMS: Facial and Oral Movements Muscles of Facial Expression: None, normal Lips and Perioral Area: None, normal Jaw: None, normal Tongue: None, normal,Extremity Movements Upper (arms, wrists, hands, fingers): None, normal Lower (legs, knees, ankles, toes): None, normal, Trunk Movements Neck, shoulders, hips: None, normal, Overall Severity Severity of abnormal movements (highest score from questions above): None,  normal Incapacitation due to abnormal movements: None, normal Patient's awareness of abnormal movements (rate only patient's report): No Awareness, Dental Status Current problems with teeth and/or dentures?: No Does patient usually wear dentures?: No  CIWA:  CIWA-Ar Total: 0 COWS:  COWS Total Score: 2  Treatment Plan Summary: Daily contact with patient to assess and evaluate symptoms and progress in treatment Medication management  Plan: 1. Will continue Haldol 15mg  po BID for delusions 2. Initiate Trileptal 150mg  po BID for labile mood. Patient refused. 3. Continue Cogentin 1mg  po BID for EPS prevention 4. Patient awaiting transfer to [facility] hospital 5. Patient encouraged to take her medications and participates in unit milieu. 6. Will try depakene suspension for mood stabilization. 7. Haldol decoanate 50mg  is given IM today and Benadryl 50mg  is given IM as well. Medical Decision Making Problem Points:  Established problem, worsening (2) and Review of psycho-social stressors (1) Data Points:  Review or order medicine tests (1)  I certify that inpatient services furnished can reasonably be expected to improve the patient's condition.  Rona Ravens. Huntley Knoop RPAC 10/10/2012, 10:57 AM

## 2012-10-10 NOTE — Clinical Social Work Note (Signed)
[facility] Group Notes:  (Counselor/Nursing/MHT/Case Management/Adjunct)  07/13/2012 12:00 PM  Type of Therapy:  Group Therapy  Participation Level:  Did Not Attend  Smart, Heather N 07/13/2012, 12:00 PM  

## 2012-10-10 NOTE — Progress Notes (Signed)
[facility] LCSW Aftercare Discharge Planning Group Note  10/10/2012 9:34 AM  Participation Quality:  Declined to attend with a smile    [name] 10/10/2012, 9:34 AM

## 2012-10-10 NOTE — Progress Notes (Signed)
Patient ID: Valerie Howard, female   DOB: Aug 22, 1976, 37 y.o.   MRN: [id number]  D: Pt denies SI/HI/AVH, but answers sarcasticly. Pt continues to be sarcastic, rude, argumentative .  Pt keeps to her room, and is angry. Unable to assess Psychosocial, because pt uncooperative, pt avoiding.  A: Pt was offered support and encouragement. Pt was encourage to attend groups. Q 15 minute checks were done for safety.   R: Pt is taking medication.Pt not  receptive to treatment and safety maintained on unit.

## 2012-10-10 NOTE — Progress Notes (Signed)
Recreation Therapy Notes  10/10/2012  Time: 9:30am   Group Topic/Focus: Time management   Participation Level:  Did not attend   Kyriaki Moder L Lamarius Dirr, LRT/CTRS    Audon Heymann L 10/10/2012 10:09 AM 

## 2012-10-11 MED ORDER — FERROUS SULFATE 325 (65 FE) MG PO TABS
325.0000 mg | ORAL_TABLET | Freq: Every day | ORAL | Status: DC
  Filled 2012-10-11 (×2): qty 1

## 2012-10-11 NOTE — Progress Notes (Signed)
Patient ID: Valerie Howard, female   DOB: [birth year], 37 y.o.   MRN: [id number] [facility] MD Progress Note  10/11/2012 11:59 AM AHMYAH GIDLEY  MRN:  [id number]  Subjective:  Patient continues to be uncooperative, refusing treatment, hostile, labile, easily agitated, demanding, verbally aggressive towards the staffs and delusional. She is resistant to medication and has minimal interaction with the staff and peers.  Diagnosis:  Schizophrenia, paranoid type  ADL's:  Intact  Sleep: Fair  Appetite:  Fair  Suicidal Ideation:  denies Homicidal Ideation:  denies AEB (as evidenced by):patient's affect, verbal response when asked, and participation in unit programming. The patient continues to refuse to attend groups. Psychiatric Specialty Exam: Review of Systems  Constitutional: Negative.   HENT: Negative.   Eyes: Negative.   Respiratory: Negative.   Cardiovascular: Negative.   Gastrointestinal: Negative.   Genitourinary: Negative.   Musculoskeletal: Negative.   Skin: Negative.   Neurological: Negative.   Endo/Heme/Allergies: Negative.   Psychiatric/Behavioral:       Labile mood, agitated    Blood pressure 132/77, pulse 94, temperature 98 F (36.7 C), temperature source Oral, resp. rate 16, height 5\' 8"  (1.727 m), weight 65.772 kg (145 lb), last menstrual period 09/15/2012.Body mass index is 22.05 kg/(m^2).  General Appearance: casual  Eye Contact::  Minimal  Speech:  goal directed  Volume:  Increased  Mood:  irritable  Affect:  Labile  Thought Process:  Loose  Orientation:  To time, place and person  Thought Content:  Delusions  Suicidal Thoughts:  No  Homicidal Thoughts:  No  Memory:  NA  Judgement:  Impaired  Insight:  Lacking  Psychomotor Activity:  Normal  Concentration:  Poor  Recall:  Poor  Akathisia:  No  Handed:    AIMS (if indicated):     Assets:  Vocational/Educational  Sleep:  Number of Hours: 6.75   Current Medications: Current  Facility-Administered Medications  Medication Dose Route Frequency Provider Last Rate Last Dose  . acetaminophen (TYLENOL) tablet 650 mg  650 mg Oral Q6H PRN Kerry Hough, PA      . alum & mag hydroxide-simeth (MAALOX/MYLANTA) 200-200-20 MG/5ML suspension 30 mL  30 mL Oral Q4H PRN Kerry Hough, PA      . benztropine (COGENTIN) tablet 1 mg  1 mg Oral BID Sharonann Malbrough   1 mg at 10/10/12 0748  . haloperidol lactate (HALDOL) injection 15 mg  15 mg Intramuscular BID Verne Spurr, PA-C   15 mg at 10/09/12 1705   Or  . haloperidol (HALDOL) tablet 15 mg  15 mg Oral BID Verne Spurr, PA-C   15 mg at 10/10/12 0747  . haloperidol (HALDOL) tablet 5 mg  5 mg Oral Q6H PRN Larena Sox, MD       Or  . haloperidol lactate (HALDOL) injection 5 mg  5 mg Intramuscular Q6H PRN Larena Sox, MD      . LORazepam (ATIVAN) injection 2 mg  2 mg Intramuscular Daily Verne Spurr, PA-C      . LORazepam (ATIVAN) tablet 2 mg  2 mg Oral Daily Kyshawn Teal   2 mg at 10/10/12 0747   Or  . LORazepam (ATIVAN) injection 2 mg  2 mg Intramuscular Daily Nestor Wieneke      . magnesium hydroxide (MILK OF MAGNESIA) suspension 30 mL  30 mL Oral Daily PRN Kerry Hough, PA      . OXcarbazepine (TRILEPTAL) tablet 150 mg  150 mg Oral BID Cammie Faulstich  Lab Results: No results found for this or any previous visit (from the past 48 hour(s)).  Physical Findings: AIMS: Facial and Oral Movements Muscles of Facial Expression: None, normal Lips and Perioral Area: None, normal Jaw: None, normal Tongue: None, normal,Extremity Movements Upper (arms, wrists, hands, fingers): None, normal Lower (legs, knees, ankles, toes): None, normal, Trunk Movements Neck, shoulders, hips: None, normal, Overall Severity Severity of abnormal movements (highest score from questions above): None, normal Incapacitation due to abnormal movements: None, normal Patient's awareness of abnormal movements (rate only patient's  report): No Awareness, Dental Status Current problems with teeth and/or dentures?: No Does patient usually wear dentures?: No  CIWA:  CIWA-Ar Total: 0 COWS:  COWS Total Score: 2  Treatment Plan Summary: Daily contact with patient to assess and evaluate symptoms and progress in treatment Medication management  Plan: 1. Will continue Haldol 15mg  po BID for delusions 2. Patient refused Trileptal but eventually agreed to to Depakene twice daily for mood stabilization. 3. Continue Cogentin 1mg  po BID for EPS prevention 4. Patient awaiting transfer to [facility] hospital 5. Patient encouraged to take her medications and participates in unit milieu. 6. Haldol decoanate 50mg  is given IM today and Benadryl 50mg  is given IM X 1 today. Medical Decision Making Problem Points:  Established problem, worsening (2) and Review of psycho-social stressors (1) Data Points:  Review or order medicine tests (1)  I certify that inpatient services furnished can reasonably be expected to improve the patient's condition.  Thedore Mins, MD 10/11/2012, 11:59 AM

## 2012-10-11 NOTE — Progress Notes (Signed)
D: Patient denies SI but is unwilling to tell staff if she is having HI and auditory or visual hallucinations. The patient is paranoid and asks Clinical research associate "Why do you need to know all of this?" The patient isolates to her room frequently throughout the day and generally refuses to have interaction within the milieu. The patient continues to have an irritable and labile mood and affect.  A: Patient given emotional support from RN. Patient encouraged to come to staff with concerns and/or questions. Patient's medication routine continued. Patient's orders and plan of care reviewed.  R: Patient remains somewhat cooperative. Will continue to monitor patient q15 minutes for safety.

## 2012-10-11 NOTE — Clinical Social Work Note (Signed)
[facility] Group Notes:  (Counselor/Nursing/MHT/Case Management/Adjunct)  07/12/2012 2:20 PM  Type of Therapy:  Group Therapy  Participation Level:  Did Not Attend    Summary of Progress/Problems: .balance: The topic for group was balance in life.  Pt participated in the discussion about when their life was in balance and out of balance and how this feels.  Pt discussed ways to get back in balance and short term goals they can work on to get where they want to be.    Tamie Minteer B 07/12/2012, 2:20 PM  

## 2012-10-11 NOTE — Progress Notes (Signed)
Patient resting quietly with eyes closed. Respirations even and unlabored, no distress noted. Q 15 minute check continues as ordered to maintain safety. 

## 2012-10-11 NOTE — Progress Notes (Signed)
[facility] Group Notes:  (Nursing/MHT/Case Management/Adjunct)  Date:  10/11/2012  Time:  4:24 PM  Type of Therapy:  Psychoeducational Skills  Participation Level:  Did Not Attend  Summary of Progress/Problems:  Valerie Howard 10/11/2012, 4:24 PM 

## 2012-10-11 NOTE — Progress Notes (Signed)
Patient ID: Valerie Howard, female   DOB: [birth year], 37 y.o.   MRN: [id number]  D: Pt denies SI/HI/AVH. Pt was un-cooperative, argumentative, and rude. Pt stated she did not want to talk. Pt answered questions sarcastically.    A: Pt was offered support and encouragement. Pt was given scheduled medications. Pt was encourage to attend groups. Q 15 minute checks were done for safety.    R:Pt attends groups and interacts well with peers and staff. Pt is taking medication. Pt has no complaints.Pt receptive to treatment and safety maintained on unit.

## 2012-10-12 MED ORDER — VALPROIC ACID 250 MG/5ML PO SYRP
1000.0000 mg | ORAL_SOLUTION | Freq: Two times a day (BID) | ORAL | Status: DC
  Administered 2012-10-12 – 2012-10-18 (×10): 1000 mg via ORAL
  Filled 2012-10-12 (×18): qty 20

## 2012-10-12 MED ORDER — FERROUS SULFATE 325 (65 FE) MG PO TABS
325.0000 mg | ORAL_TABLET | Freq: Two times a day (BID) | ORAL | Status: DC
  Administered 2012-10-18: 325 mg via ORAL
  Filled 2012-10-12 (×16): qty 1

## 2012-10-12 NOTE — Progress Notes (Signed)
Patient ID: Valerie Howard, female   DOB: 10-25-1975, 37 y.o.   MRN: [id number] [facility] MD Progress Note  10/12/2012 9:16 AM Valerie Howard  MRN:  [id number]  Subjective:  Patient refused to take ferrous sulfate for low hemoglobin but accepted her other medications. She remains hostile to the staffs, verbally aggressive, easily agitated, demanding and uncompromising. She is delusional and has poor insight into her mental illness.  Diagnosis:  Schizophrenia, paranoid type  ADL's:  Intact  Sleep: Fair  Appetite:  Fair  Suicidal Ideation:  denies Homicidal Ideation:  denies AEB (as evidenced by):patient's affect, verbal response when asked, and participation in unit programming. The patient continues to refuse to attend groups. Psychiatric Specialty Exam: Review of Systems  Constitutional: Negative.   HENT: Negative.   Eyes: Negative.   Respiratory: Negative.   Cardiovascular: Negative.   Gastrointestinal: Negative.   Genitourinary: Negative.   Musculoskeletal: Negative.   Skin: Negative.   Neurological: Negative.   Endo/Heme/Allergies: Negative.   Psychiatric/Behavioral:       Labile mood, agitated    Blood pressure 127/81, pulse 100, temperature 97.6 F (36.4 C), temperature source Oral, resp. rate 18, height 5\' 8"  (1.727 m), weight 65.772 kg (145 lb), last menstrual period 09/15/2012.Body mass index is 22.05 kg/(m^2).  General Appearance: casual  Eye Contact::  Minimal  Speech:  goal directed  Volume:  Increased  Mood:  irritable  Affect:  Labile  Thought Process:  Loose  Orientation:  To time, place and person  Thought Content:  Delusions  Suicidal Thoughts:  No  Homicidal Thoughts:  No  Memory:  NA  Judgement:  Impaired  Insight:  Lacking  Psychomotor Activity:  Normal  Concentration:  Poor  Recall:  Poor  Akathisia:  No  Handed:    AIMS (if indicated):     Assets:  Vocational/Educational  Sleep:  Number of Hours: 4.5   Current  Medications: Current Facility-Administered Medications  Medication Dose Route Frequency Provider Last Rate Last Dose  . acetaminophen (TYLENOL) tablet 650 mg  650 mg Oral Q6H PRN Kerry Hough, PA      . alum & mag hydroxide-simeth (MAALOX/MYLANTA) 200-200-20 MG/5ML suspension 30 mL  30 mL Oral Q4H PRN Kerry Hough, PA      . benztropine (COGENTIN) tablet 1 mg  1 mg Oral BID Umberto Pavek   1 mg at 10/10/12 0748  . haloperidol lactate (HALDOL) injection 15 mg  15 mg Intramuscular BID Verne Spurr, PA-C   15 mg at 10/09/12 1705   Or  . haloperidol (HALDOL) tablet 15 mg  15 mg Oral BID Verne Spurr, PA-C   15 mg at 10/10/12 0747  . haloperidol (HALDOL) tablet 5 mg  5 mg Oral Q6H PRN Larena Sox, MD       Or  . haloperidol lactate (HALDOL) injection 5 mg  5 mg Intramuscular Q6H PRN Larena Sox, MD      . LORazepam (ATIVAN) injection 2 mg  2 mg Intramuscular Daily Verne Spurr, PA-C      . LORazepam (ATIVAN) tablet 2 mg  2 mg Oral Daily Vada Swift   2 mg at 10/10/12 0747   Or  . LORazepam (ATIVAN) injection 2 mg  2 mg Intramuscular Daily Abbie Berling      . magnesium hydroxide (MILK OF MAGNESIA) suspension 30 mL  30 mL Oral Daily PRN Kerry Hough, PA      . OXcarbazepine (TRILEPTAL) tablet 150 mg  150 mg Oral  BID Jaila Schellhorn        Lab Results: No results found for this or any previous visit (from the past 48 hour(s)).  Physical Findings: AIMS: Facial and Oral Movements Muscles of Facial Expression: None, normal Lips and Perioral Area: None, normal Jaw: None, normal Tongue: None, normal,Extremity Movements Upper (arms, wrists, hands, fingers): None, normal Lower (legs, knees, ankles, toes): None, normal, Trunk Movements Neck, shoulders, hips: None, normal, Overall Severity Severity of abnormal movements (highest score from questions above): None, normal Incapacitation due to abnormal movements: None, normal Patient's awareness of abnormal movements  (rate only patient's report): No Awareness, Dental Status Current problems with teeth and/or dentures?: No Does patient usually wear dentures?: No  CIWA:  CIWA-Ar Total: 0 COWS:  COWS Total Score: 2  Treatment Plan Summary: Daily contact with patient to assess and evaluate symptoms and progress in treatment Medication management  Plan: 1. Will continue Haldol 15mg  po BID for delusions 2. Increase Depakene to 500mg /63ml  twice daily for mood stabilization. 3. Continue Cogentin 1mg  po BID for EPS prevention 4. Patient awaiting transfer to [facility] hospital 5. Patient encouraged to take her medications and participates in unit milieu. 6. Haldol decoanate 50mg  is given IM today and Benadryl 50mg  is given IM (10/11/2012). Medical Decision Making Problem Points:  Established problem, worsening (2) and Review of psycho-social stressors (1) Data Points:  Review or order medicine tests (1)  I certify that inpatient services furnished can reasonably be expected to improve the patient's condition.  Thedore Mins, MD 10/12/2012, 9:16 AM

## 2012-10-12 NOTE — Progress Notes (Signed)
Psychoeducational Group Note  Date:  10/12/2012 Time:  2000 Group Topic/Focus:  Wrap-Up Group:   The focus of this group is to help patients review their daily goal of treatment and discuss progress on daily workbooks.  Participation Level: Did Not Attend  Participation Quality:  Not Applicable  Affect:  Not Applicable  Cognitive:  Not Applicable  Insight:  Not Applicable  Engagement in Group: Not Applicable  Additional Comments:  Pt refused to attend group  [name] 10/12/2012, 9:41 PM

## 2012-10-12 NOTE — Progress Notes (Signed)
Recreation Therapy Notes   10/12/2012  Time: 9:30am   Group Topic/Focus: Exercise   Participation Level:  Did not attend   Jearl Klinefelter, LRT/CTRS    Allure Greaser L 10/12/2012 10:14 AM

## 2012-10-12 NOTE — Clinical Social Work Note (Signed)
[facility] Group Notes:  (Counselor/Nursing/MHT/Case Management/Adjunct)  07/13/2012 12:00 PM  Type of Therapy:  Group Therapy  Participation Level:  Did Not Attend  Smart, Heather N 07/13/2012, 12:00 PM  

## 2012-10-12 NOTE — Treatment Plan (Signed)
Interdisciplinary Treatment Plan Update (Adult)  Date: 10/12/2012  Time Reviewed: 2:52 PM   Progress in Treatment: Attending groups: No Participating in groups: No Taking medication as prescribed: Yes Tolerating medication: Yes  Family/Significant other contact made:  No Patient understands diagnosis:  No insight Discussing patient identified problems/goals with staff:  No  She believes she has no problems  Medical problems stabilized or resolved:  Yes Denies suicidal/homicidal ideation: Yes  In tx team Issues/concerns per patient self-inventory:  Not filled out Other:  New problem(s) identified: N/A  Reason for Continuation of Hospitalization: Delusions  Hallucinations  Interventions implemented related to continuation of hospitalization: Janayla is now taking medications PO as she does not want an injection, which was getting regularly when she refused PO Encourage group attendance and participation  Additional comments: Pt is stating she will not go to groups, and is only taking medication under duress  Estimated length of stay: until a bed becomes available at [facility], or she becomes asymptomatic enough that her husband is willing to take her home  Discharge Plan: see above  New goal(s): N/A  Review of initial/current patient goals per problem list:   1.  Goal(s):Eliminate psychosis/delusions through the use of medications/therapeutic milieu  Met:  No  Target date:2/19  As evidenced by: Paranoia, delusions, anxiety, refusal to engage in therapy or the therapeutic milieu  2.  Goal (s): Transfer to [facility]  Met:  No  Target date:2/19  As evidenced by: confirmation of bed  3.  Goal(s):  Met:  No  Target date:  As evidenced by:  4.  Goal(s):  Met:  No  Target date:  As evidenced by:  Attendees: Patient:     Family:     Physician:  Thedore Mins 10/12/2012 2:52 PM   Nursing:  Nestor Ramp  10/12/2012 2:52 PM   Clinical Social Worker:  Richelle Ito 10/12/2012  2:52 PM   Extender:  Verne Spurr PA 10/12/2012 2:52 PM   Other:     Other:     Other:     Other:      Scribe for Treatment Team:   [name], 10/12/2012 2:52 PM

## 2012-10-12 NOTE — Progress Notes (Signed)
D: Patient denies SI but is unwilling to tell staff if she is having HI and auditory or visual hallucinations. The patient is paranoid and asks Clinical research associate "Why do you want to know?" The patient isolates to her room frequently throughout the day and generally refuses to have interaction within the milieu. The patient continues to have an irritable and labile mood and affect. The patient is also refusing to take an iron medication for her low hemoglobin.  A: Patient given emotional support from RN. Patient encouraged to come to staff with concerns and/or questions. Patient's medication routine continued. Patient's orders and plan of care reviewed. MD notified about patient's medication refusal.  R: Patient remains somewhat cooperative. Will continue to monitor patient q15 minutes for safety.

## 2012-10-13 DIAGNOSIS — D509 Iron deficiency anemia, unspecified: Secondary | ICD-10-CM

## 2012-10-13 NOTE — Progress Notes (Signed)
Patient ID: Valerie Howard, female   DOB: [birth year], 37 y.o.   MRN: [id number]  D: Pt denies SI/HI/AVH. Pt attitude is better. Pt states "i will not take medication when i leave here, i don't need it. Pt not to receptive to questions, but cooperated, but still was sarcastic with her answers. Pt still guarded when asking questions.    A: Pt was offered support and encouragement.Pt was encourage to attend groups. Q 15 minute checks were done for safety.    R:  .Pt not receptive to treatment and safety maintained on unit.

## 2012-10-13 NOTE — Progress Notes (Signed)
Patient ID: Valerie Howard, female   DOB: 12/07/1975, 37 y.o.   MRN: [id number] 10-13-12 @ 1535 nursing shift note: D: she continues to be angry and uncooperative. She is resistant to medications. A: will extreme amt's of encouragement she will take her medications. R: she continue to stay in her room coming out occasionally. She is going to meals and has had no complaints of pain. She continue to ask "when will I be discharged. RN will monitor and Q 15 min ck's continue.

## 2012-10-13 NOTE — Progress Notes (Signed)
Patient ID: Valerie Howard, female   DOB: 06-01-1976, 37 y.o.   MRN: [id number] [facility] MD Progress Note  10/13/2012 11:21 AM Valerie Howard  MRN:  [id number]  Subjective:  "The same as I am everyday." Met with Jacolyn 1:1 to discuss her progress. She remains hostile to the staffs, verbally aggressive, easily agitated, demanding and uncompromising. She is delusional and has poor insight into her mental illness. She also stated that she was told a discharge date. This is not documented anywhere and very unlikely.  She did take her medications (oral depakote) in front of me, after she had refused earlier.  Diagnosis:  Schizophrenia, paranoid type                      Iron deficiency anemia ADL's:  Intact  Sleep: Fair  Appetite:  Fair  Suicidal Ideation:  denies Homicidal Ideation:  denies AEB (as evidenced by):patient's affect, verbal response when asked, and participation in unit programming. The patient continues to refuse to attend groups. Psychiatric Specialty Exam: Review of Systems  Constitutional: Negative.   HENT: Negative.   Eyes: Negative.   Respiratory: Negative.   Cardiovascular: Negative.   Gastrointestinal: Negative.   Genitourinary: Negative.   Musculoskeletal: Negative.   Skin: Negative.   Neurological: Negative.   Endo/Heme/Allergies: Negative.   Psychiatric/Behavioral:       Labile mood, agitated    Blood pressure 121/80, pulse 102, temperature 98.8 F (37.1 C), temperature source Oral, resp. rate 16, height 5\' 8"  (1.727 m), weight 65.772 kg (145 lb), last menstrual period 09/15/2012.Body mass index is 22.05 kg/(m^2).  General Appearance: casual  Eye Contact::  Minimal  Speech:  goal directed  Volume:  Increased  Mood:  Irritable un-cooperative angry   Affect:  Labile  Thought Process:  Loose  Orientation:  To time, place and person  Thought Content:  Delusions  Fixed   Suicidal Thoughts:  No  Homicidal Thoughts:  No  Memory:  NA   Judgement:  Impaired  Insight:  Lacking  Psychomotor Activity:  Normal  Concentration:  Poor  Recall:  Poor  Akathisia:  No  Handed:    AIMS (if indicated):     Assets:  Vocational/Educational  Sleep:  Number of Hours: 5.5   Current Medications: Current Facility-Administered Medications  Medication Dose Route Frequency Provider Last Rate Last Dose  . acetaminophen (TYLENOL) tablet 650 mg  650 mg Oral Q6H PRN Kerry Hough, PA      . alum & mag hydroxide-simeth (MAALOX/MYLANTA) 200-200-20 MG/5ML suspension 30 mL  30 mL Oral Q4H PRN Kerry Hough, PA      . benztropine (COGENTIN) tablet 1 mg  1 mg Oral BID Mojeed Akintayo   1 mg at 10/10/12 0748  . haloperidol lactate (HALDOL) injection 15 mg  15 mg Intramuscular BID Verne Spurr, PA-C   15 mg at 10/09/12 1705   Or  . haloperidol (HALDOL) tablet 15 mg  15 mg Oral BID Verne Spurr, PA-C   15 mg at 10/10/12 0747  . haloperidol (HALDOL) tablet 5 mg  5 mg Oral Q6H PRN Larena Sox, MD       Or  . haloperidol lactate (HALDOL) injection 5 mg  5 mg Intramuscular Q6H PRN Larena Sox, MD      . LORazepam (ATIVAN) injection 2 mg  2 mg Intramuscular Daily Verne Spurr, PA-C      . LORazepam (ATIVAN) tablet 2 mg  2 mg Oral Daily Mojeed  Akintayo   2 mg at 10/10/12 0747   Or  . LORazepam (ATIVAN) injection 2 mg  2 mg Intramuscular Daily Mojeed Akintayo      . magnesium hydroxide (MILK OF MAGNESIA) suspension 30 mL  30 mL Oral Daily PRN Kerry Hough, PA      . OXcarbazepine (TRILEPTAL) tablet 150 mg  150 mg Oral BID Mojeed Akintayo        Lab Results: No results found for this or any previous visit (from the past 48 hour(s)).  Physical Findings: AIMS: Facial and Oral Movements Muscles of Facial Expression: None, normal Lips and Perioral Area: None, normal Jaw: None, normal Tongue: None, normal,Extremity Movements Upper (arms, wrists, hands, fingers): None, normal Lower (legs, knees, ankles, toes): None, normal, Trunk  Movements Neck, shoulders, hips: None, normal, Overall Severity Severity of abnormal movements (highest score from questions above): None, normal Incapacitation due to abnormal movements: None, normal Patient's awareness of abnormal movements (rate only patient's report): No Awareness, Dental Status Current problems with teeth and/or dentures?: No Does patient usually wear dentures?: No  CIWA:  CIWA-Ar Total: 0 COWS:  COWS Total Score: 2  Treatment Plan Summary: Daily contact with patient to assess and evaluate symptoms and progress in treatment Medication management Added Iron deficiency anemia to diagnosis list. Plan: 1. Will continue Haldol 15mg  po BID for delusions 2. Increase Depakene to 1000mg  oral suspension twice daily for mood stabilization. 3. Continue Cogentin 1mg  po BID for EPS prevention 4. Patient awaiting transfer to [facility] hospital 5. Patient encouraged to take her medications and participates in unit milieu. 6. Haldol decoanate 50mg  is given IM today and Benadryl 50mg  is given IM (10/11/2012). 7. Encouraged patient to take her Iron and explained her diagnosis and the patient refused to take it. Medical Decision Making Problem Points:  Established problem, worsening (2) and Review of psycho-social stressors (1) Data Points:  Review or order medicine tests (1)  I certify that inpatient services furnished can reasonably be expected to improve the patient's condition.   Rona Ravens. Delwyn Scoggin RPAC 10/13/2012, 11:21 AM

## 2012-10-13 NOTE — Progress Notes (Signed)
Adult Psychoeducational Group Note  Date:  10/13/2012 Time:  10:20  Group Topic/Focus:  Healthy Coping Skills  Participation Level:  Did Not Attend  Participation Quality:    Affect:   Cognitive:    Insight:   Engagement in Group:    Modes of Intervention:    Additional Comments:   Schamberg, Eric Sean 10/13/2012, 10:40 AM 

## 2012-10-13 NOTE — Clinical Social Work Note (Signed)
[facility] Group Notes: (Clinical Social Work)   10/13/2012      Type of Therapy:  Group Therapy   Participation Level:  Did Not Attend    Cadynce Garrette Grossman-Orr, LCSW 10/13/2012, 1:31 PM     

## 2012-10-13 NOTE — Progress Notes (Signed)
Patient ID: Valerie Howard, female   DOB: 15-Jul-1976, 37 y.o.   MRN: [id number] Psychoeducational Group Note  Date:  10/13/2012 Time:0930am  Group Topic/Focus:  Identifying Needs:   The focus of this group is to help patients identify their personal needs that have been historically problematic and identify healthy behaviors to address their needs.  Participation Level:  Minimal  Participation Quality:  none   Affect:  Angry  Cognitive:  Lacking  Insight:  None  Engagement in Group:  None  Additional Comments:  Pt left the inventory group   Valente David 10/13/2012,10:03 AM

## 2012-10-14 NOTE — Clinical Social Work Note (Signed)
[facility] Group Notes: (Clinical Social Work)   10/14/2012      Type of Therapy:  Group Therapy   Participation Level:  Did Not Attend    Edman Lipsey Grossman-Orr, LCSW 10/14/2012, 12:36 PM     

## 2012-10-14 NOTE — Progress Notes (Signed)
Psychoeducational Group Note  Date:  10/14/2012 Time:  2000  Group Topic/Focus:  Wrap-Up Group:   The focus of this group is to help patients review their daily goal of treatment and discuss progress on daily workbooks.  Participation Level: Did Not Attend  Participation Quality:  Not Applicable  Affect:  Not Applicable  Cognitive:  Not Applicable  Insight:  Not Applicable  Engagement in Group: Not Applicable  Additional Comments:  The patient did not attend group this evening since she was asleep.   Hazle Coca S 10/14/2012, 9:50 PM

## 2012-10-14 NOTE — Progress Notes (Signed)
Patient ID: Valerie Howard, female   DOB: 1976/02/23, 37 y.o.   MRN: [id number] 10-14-12 nursing shift note: D: her behaviors have been consistently the same. She is labile, irritable, argumentative and resistant to taking medications. A: with a great deal of encouragement she took her am medications except her Depakote. She has a forced medication order. RN will continue to encourage pt to take her medications. R: she is disinterested in her medications. RN will monitor and Q 15 min ck's continue.

## 2012-10-14 NOTE — Progress Notes (Signed)
Patient ID: Valerie Howard, female   DOB: [birth year], 37 y.o.   MRN: [id number] Psychoeducational Group Note  Date:  10/14/2012 Time:  1000am  Group Topic/Focus:  Making Healthy Choices:   The focus of this group is to help patients identify negative/unhealthy choices they were using prior to admission and identify positive/healthier coping strategies to replace them upon discharge.  Participation Level:  Did Not Attend  Participation Quality:    Affect:  Cognitive:  Insight:  Engagement in Group:  Additional Comments: Inventory group- pt attended then left.   Valente David 10/14/2012,9:48 AM

## 2012-10-14 NOTE — Progress Notes (Addendum)
Patient ID: Valerie Howard, female   DOB: 05-03-1976, 37 y.o.   MRN: [id number] Psychoeducational Group Note  Date:  10/14/2012 Time:  1515pm  Group Topic/Focus:  Making Healthy Choices:   The focus of this group is to help patients identify negative/unhealthy choices they were using prior to admission and identify positive/healthier coping strategies to replace them upon discharge.  Participation Level:  Did Not Attend  Participation Quality:    Affect:  Cognitive:  Insight:  Engagement in Group: Additional Comments: positive feelings grooup   Valente David 10/14/2012,4:49 PM

## 2012-10-14 NOTE — Progress Notes (Signed)
Patient ID: Valerie Howard, female   DOB: 1976/02/02, 37 y.o.   MRN: [id number] D. The patient spent the evening resting in bed. Minimal interaction with staff. A. Encouraged to attend the evening wrap up group. 15 minute checks maintained for safety. R. The patient chose not to attend evening group. Glared at staff when invited to attend without giving a verbal response. Safety maintained.

## 2012-10-14 NOTE — Progress Notes (Signed)
Patient ID: Valerie Howard, female   DOB: [birth year], 37 y.o.   MRN: [id number] [facility] MD Progress Note  10/14/2012 1:06 PM Valerie Howard  MRN:  [id number]  Subjective:  "The same as I am everyday." Patient states she has attended the group meetings, "indirectly." Provides no other information. She is informed that she will be getting a roommate today. Diagnosis:  Schizophrenia, paranoid type                      Iron deficiency anemia ADL's:  Intact  Sleep: Fair  Appetite:  Fair  Suicidal Ideation:  denies Homicidal Ideation:  denies AEB (as evidenced by):patient's affect, verbal response when asked, and participation in unit programming. The patient continues to refuse to attend groups. Psychiatric Specialty Exam: Review of Systems  Constitutional: Negative.   HENT: Negative.   Eyes: Negative.   Respiratory: Negative.   Cardiovascular: Negative.   Gastrointestinal: Negative.   Genitourinary: Negative.   Musculoskeletal: Negative.   Skin: Negative.   Neurological: Negative.   Endo/Heme/Allergies: Negative.   Psychiatric/Behavioral:       Labile mood, agitated    Blood pressure 143/71, pulse 103, temperature 97.6 F (36.4 C), temperature source Oral, resp. rate 16, height 5\' 8"  (1.727 m), weight 65.772 kg (145 lb), last menstrual period 09/15/2012.Body mass index is 22.05 kg/(m^2).  General Appearance: casual  Eye Contact::  Minimal  Speech:  goal directed  Volume:  Increased  Mood:  Irritable un-cooperative angry   Affect:  Labile  Thought Process:  Loose  Orientation:  To time, place and person  Thought Content:  Delusions  Fixed   Suicidal Thoughts:  No  Homicidal Thoughts:  No  Memory:  NA  Judgement:  Impaired  Insight:  Lacking  Psychomotor Activity:  Normal  Concentration:  Poor  Recall:  Poor  Akathisia:  No  Handed:    AIMS (if indicated):     Assets:  Vocational/Educational  Sleep:  Number of Hours: 6.25   Current  Medications: Current Facility-Administered Medications  Medication Dose Route Frequency Provider Last Rate Last Dose  . acetaminophen (TYLENOL) tablet 650 mg  650 mg Oral Q6H PRN Kerry Hough, PA      . alum & mag hydroxide-simeth (MAALOX/MYLANTA) 200-200-20 MG/5ML suspension 30 mL  30 mL Oral Q4H PRN Kerry Hough, PA      . benztropine (COGENTIN) tablet 1 mg  1 mg Oral BID Mojeed Akintayo   1 mg at 10/10/12 0748  . haloperidol lactate (HALDOL) injection 15 mg  15 mg Intramuscular BID Verne Spurr, PA-C   15 mg at 10/09/12 1705   Or  . haloperidol (HALDOL) tablet 15 mg  15 mg Oral BID Verne Spurr, PA-C   15 mg at 10/10/12 0747  . haloperidol (HALDOL) tablet 5 mg  5 mg Oral Q6H PRN Larena Sox, MD       Or  . haloperidol lactate (HALDOL) injection 5 mg  5 mg Intramuscular Q6H PRN Larena Sox, MD      . LORazepam (ATIVAN) injection 2 mg  2 mg Intramuscular Daily Verne Spurr, PA-C      . LORazepam (ATIVAN) tablet 2 mg  2 mg Oral Daily Mojeed Akintayo   2 mg at 10/10/12 0747   Or  . LORazepam (ATIVAN) injection 2 mg  2 mg Intramuscular Daily Mojeed Akintayo      . magnesium hydroxide (MILK OF MAGNESIA) suspension 30 mL  30 mL Oral Daily  PRN Kerry Hough, PA      . OXcarbazepine (TRILEPTAL) tablet 150 mg  150 mg Oral BID Mojeed Akintayo        Lab Results: No results found for this or any previous visit (from the past 48 hour(s)).  Physical Findings: AIMS: Facial and Oral Movements Muscles of Facial Expression: None, normal Lips and Perioral Area: None, normal Jaw: None, normal Tongue: None, normal,Extremity Movements Upper (arms, wrists, hands, fingers): None, normal Lower (legs, knees, ankles, toes): None, normal, Trunk Movements Neck, shoulders, hips: None, normal, Overall Severity Severity of abnormal movements (highest score from questions above): None, normal Incapacitation due to abnormal movements: None, normal Patient's awareness of abnormal movements  (rate only patient's report): No Awareness, Dental Status Current problems with teeth and/or dentures?: No Does patient usually wear dentures?: No  CIWA:  CIWA-Ar Total: 0 COWS:  COWS Total Score: 2  Treatment Plan Summary: Daily contact with patient to assess and evaluate symptoms and progress in treatment Medication management Added Iron deficiency anemia to diagnosis list. Plan: 1. Will continue Haldol 15mg  po BID for delusions 2. Increase Depakene to 1000mg  oral suspension twice daily for mood stabilization. 3. Continue Cogentin 1mg  po BID for EPS prevention 4. Patient awaiting transfer to [facility] hospital 5. Patient encouraged to take her medications and participates in unit milieu. 6. She is still on the [facility] waitlist. Medical Decision Making Problem Points:  Established problem, worsening (2) and Review of psycho-social stressors (1) Data Points:  Review or order medicine tests (1)  I certify that inpatient services furnished can reasonably be expected to improve the patient's condition.   Rona Ravens. Telesia Ates RPAC 10/14/2012, 1:06 PM

## 2012-10-14 NOTE — Progress Notes (Signed)
Psychoeducational Group Note  Date:  10/13/2012 Time:  2000  Group Topic/Focus:  Wrap-Up Group:   The focus of this group is to help patients review their daily goal of treatment and discuss progress on daily workbooks.  Participation Level: Did Not Attend  Participation Quality:  Not Applicable  Affect:  Not Applicable  Cognitive:  Not Applicable  Insight:  Not Applicable  Engagement in Group: Not Applicable  Additional Comments:  The patient did not attend group.   Kayline Sheer S 10/14/2012, 1:12 AM

## 2012-10-15 DIAGNOSIS — F200 Paranoid schizophrenia: Secondary | ICD-10-CM

## 2012-10-15 NOTE — Progress Notes (Signed)
Patient ID: Valerie Howard, female   DOB: 1975/12/18, 37 y.o.   MRN: [id number] D: Pt. Lying on chair in day room watching TV, minimal contact with Clinical research associate. "I'm doing okay" Pt. Denies pain. A: Writer introduced self to client, encouraged pt. To verbalize concerns. Pt.will be monitored q68min for safety. Staff encouraged group. R: pt. Is safe on the unit. Pt. Refused group.

## 2012-10-15 NOTE — Progress Notes (Signed)
Patient ID: Valerie Howard, female   DOB: 1975-10-15, 37 y.o.   MRN: [id number] D. The patient spent the evening resting in bed with eyes closed. No distress note. A. Encouraged to attend evening wrap up group. Offered evening snack. R. The patient did not attended evening group. Did not get out of bed for a snack.

## 2012-10-15 NOTE — Progress Notes (Signed)
[facility] MD Progress Note  10/15/2012 4:11 PM Valerie Howard  MRN:  [id number] Subjective:  Patient responds fine to all questions and "I am not suicidal, depressed, not seeing things or hearing things, just don't want to be here."   Diagnosis:   Axis I: Chronic Paranoid Schizophrenia Axis II: Deferred Axis III:  Past Medical History  Diagnosis Date  . Paranoid behavior    Axis IV: other psychosocial or environmental problems, problems related to social environment and problems with primary support group Axis V: 41-50 serious symptoms  ADL's:  Intact  Sleep: Good  Appetite:  Good  Suicidal Ideation:  Denies Homicidal Ideation:  Denies  Psychiatric Specialty Exam: Review of Systems  Constitutional: Negative.   HENT: Negative.   Eyes: Negative.   Respiratory: Negative.   Cardiovascular: Negative.   Gastrointestinal: Negative.   Genitourinary: Negative.   Musculoskeletal: Negative.   Skin: Negative.   Neurological: Negative.   Endo/Heme/Allergies: Negative.   Psychiatric/Behavioral: Negative.     Blood pressure 138/91, pulse 103, temperature 97.8 F (36.6 C), temperature source Oral, resp. rate 16, height 5\' 8"  (1.727 m), weight 65.772 kg (145 lb), last menstrual period 09/15/2012.Body mass index is 22.05 kg/(m^2).  General Appearance: Casual  Eye Contact::  Fair  Speech:  Normal Rate  Volume:  Normal  Mood:  Irritable  Affect:  Congruent  Thought Process:  Coherent  Orientation:  Full (Time, Place, and Person)  Thought Content:  WDL  Suicidal Thoughts:  No  Homicidal Thoughts:  No  Memory:  Immediate;   Fair Recent;   Fair Remote;   Fair  Judgement:  Fair  Insight:  Fair  Psychomotor Activity:  Normal  Concentration:  Fair  Recall:  Fair  Akathisia:  No  Handed:  Right  AIMS (if indicated):     Assets:  Physical Health Resilience  Sleep:  Number of Hours: 5.75   Current Medications: Current Facility-Administered Medications  Medication Dose  Route Frequency Provider Last Rate Last Dose  . acetaminophen (TYLENOL) tablet 650 mg  650 mg Oral Q6H PRN Kerry Hough, PA      . alum & mag hydroxide-simeth (MAALOX/MYLANTA) 200-200-20 MG/5ML suspension 30 mL  30 mL Oral Q4H PRN Kerry Hough, PA      . benztropine (COGENTIN) tablet 1 mg  1 mg Oral BID Mojeed Akintayo   1 mg at 10/15/12 0844  . ferrous sulfate tablet 325 mg  325 mg Oral BID WC Mojeed Akintayo      . haloperidol lactate (HALDOL) injection 15 mg  15 mg Intramuscular BID Verne Spurr, PA-C   15 mg at 10/13/12 1655   Or  . haloperidol (HALDOL) tablet 15 mg  15 mg Oral BID Verne Spurr, PA-C   15 mg at 10/15/12 0844  . haloperidol (HALDOL) tablet 5 mg  5 mg Oral Q6H PRN Larena Sox, MD       Or  . haloperidol lactate (HALDOL) injection 5 mg  5 mg Intramuscular Q6H PRN Larena Sox, MD      . LORazepam (ATIVAN) injection 2 mg  2 mg Intramuscular Daily Verne Spurr, PA-C      . LORazepam (ATIVAN) tablet 2 mg  2 mg Oral Daily Mojeed Akintayo   2 mg at 10/15/12 0844   Or  . LORazepam (ATIVAN) injection 2 mg  2 mg Intramuscular Daily Mojeed Akintayo      . magnesium hydroxide (MILK OF MAGNESIA) suspension 30 mL  30 mL Oral Daily PRN  Kerry Hough, PA      . Valproic Acid (DEPAKENE) 250 MG/5ML syrup SYRP 1,000 mg  1,000 mg Oral BID Verne Spurr, PA-C   1,000 mg at 10/15/12 0845    Lab Results: No results found for this or any previous visit (from the past 48 hour(s)).  Physical Findings: AIMS: Facial and Oral Movements Muscles of Facial Expression: None, normal Lips and Perioral Area: None, normal Jaw: None, normal Tongue: None, normal,Extremity Movements Upper (arms, wrists, hands, fingers): None, normal Lower (legs, knees, ankles, toes): None, normal, Trunk Movements Neck, shoulders, hips: None, normal, Overall Severity Severity of abnormal movements (highest score from questions above): None, normal Incapacitation due to abnormal movements: None,  normal Patient's awareness of abnormal movements (rate only patient's report): No Awareness, Dental Status Current problems with teeth and/or dentures?: No Does patient usually wear dentures?: No  CIWA:  CIWA-Ar Total: 0 COWS:  COWS Total Score: 2  Treatment Plan Summary: Daily contact with patient to assess and evaluate symptoms and progress in treatment Medication management  Plan:  Review of chart, vital signs, medications, and notes. 1-Individual and group therapy 2-Medication management for psychosis:  Medications reviewed with the patient and she stated no untoward effects 3-Coping skills for chronic mental health issue 4-Continue crisis stabilization and management 5-Address health issues--stable 6-Treatment plan in progress to prevent relapse of psychosis  Medical Decision Making Problem Points:  Established problem, stable/improving (1) and Review of psycho-social stressors (1) Data Points:  Review of medication regiment & side effects (2)  I certify that inpatient services furnished can reasonably be expected to improve the patient's condition.   Nanine Means, PMH-NP 10/15/2012, 4:11 PM

## 2012-10-15 NOTE — Progress Notes (Signed)
Pt denies SI/HI/AVH. Pt denies pain and show no s/s of distress. Pt compliant with meds. Pt refuses to attend groups. Pt have been actively engaged on the milieu.  Medications administered as ordered per MD. Verbal support given. Pt encouraged to attend groups. 15 minute checks performed for safety.  Pt is safe. Pt refused to take iron supplement. Pt continues to be noncompliant with treatment.

## 2012-10-16 NOTE — Progress Notes (Signed)
D:  Patient up and in the milieu today.  Has spent most of group times walking in the hallway.  Has been irritable with me but is interacting well with most of the other staff.  Actually requested a different nurse today.  Refused valproate liquid this evening.  A:  Medications given as ordered.  When patient was given dinner time medications, the valproate was not on the unit.  I told the patient that I was going to request it from the pharmacy and she could stop and get it after dinner.  By the time she got back on the unit she insisted that she had already taken the medication prior to dinner and was not going to take it again.   R:  Difficult to assess whether patient is confused or if she is playing staff.  Has been much more interactive today with staff and peers excluding myself.

## 2012-10-16 NOTE — Progress Notes (Signed)
Patient ID: Valerie Howard, female   DOB: Dec 02, 1975, 37 y.o.   MRN: [id number]  D: Pt denies SI/HI/AVH. Pt was pleasant and cooperative. Pt spoke to me today. Pt says she still does not know where she is going when she leaves here. Pt states she had "too many events", things that have happened to her. Pt still continues to exhibit paranoid behaviors.   A: Pt was offered support and encouragement. Pt was given scheduled medications. Pt was encourage to attend groups. Q 15 minute checks were done for safety.    R:Pt attends groups and interacts well with peers and staff. Pt is taking medication. Pt has no complaints.Pt receptive to treatment and safety maintained on unit.

## 2012-10-16 NOTE — Progress Notes (Signed)
Patient ID: Valerie Howard, female   DOB: 1975/11/27, 37 y.o.   MRN: [id number] [facility] MD Progress Note  10/16/2012 11:51 AM Valerie Howard  MRN:  [id number]  Subjective:  "The same as I am everyday, I don't need to be here." Today Valerie Howard is cooperative, and less rude than she has been almost pleasant. She answers all of the same questions each day, and asks what her discharge date is. She states Dr. Jannifer Franklin told her Friday that she would be leaving Monday or Tuesday. I reassured her, I had checked the record and did not find this to be so, but I would speak with him today to ask.  Diagnosis:  Schizophrenia, paranoid type                      Iron deficiency anemia ADL's:  Intact  Sleep: Fair  Appetite:  Fair  Suicidal Ideation:  denies Homicidal Ideation:  denies AEB (as evidenced by):patient's affect, verbal response when asked, and participation in unit programming. The patient continues to refuse to attend groups. Psychiatric Specialty Exam: Review of Systems  Constitutional: Negative.   HENT: Negative.   Eyes: Negative.   Respiratory: Negative.   Cardiovascular: Negative.   Gastrointestinal: Negative.   Genitourinary: Negative.   Musculoskeletal: Negative.   Skin: Negative.   Neurological: Negative.   Endo/Heme/Allergies: Negative.   Psychiatric/Behavioral:       Labile mood, agitated    Blood pressure 117/76, pulse 112, temperature 98.5 F (36.9 C), temperature source Oral, resp. rate 20, height 5\' 8"  (1.727 m), weight 65.772 kg (145 lb), last menstrual period 09/15/2012.Body mass index is 22.05 kg/(m^2).  General Appearance: casual  Eye Contact::  Minimal  Speech:  goal directed  Volume:  Increased  Mood:  Cooperative calm and almost pleasant.  Affect:  euthymic  Thought Process: linear  Orientation:  To time, place and person  Thought Content:  Delusions  Fixed   Suicidal Thoughts:  No  Homicidal Thoughts:  No  Memory:  NA  Judgement:   Impaired  Insight:  Lacking  Psychomotor Activity:  Normal  Concentration:  Poor  Recall:  Poor  Akathisia:  No  Handed:    AIMS (if indicated):     Assets:  Vocational/Educational  Sleep:  Number of Hours: 6.75   Current Medications: Current Facility-Administered Medications  Medication Dose Route Frequency Provider Last Rate Last Dose  . acetaminophen (TYLENOL) tablet 650 mg  650 mg Oral Q6H PRN Kerry Hough, PA      . alum & mag hydroxide-simeth (MAALOX/MYLANTA) 200-200-20 MG/5ML suspension 30 mL  30 mL Oral Q4H PRN Kerry Hough, PA      . benztropine (COGENTIN) tablet 1 mg  1 mg Oral BID Mojeed Akintayo   1 mg at 10/10/12 0748  . haloperidol lactate (HALDOL) injection 15 mg  15 mg Intramuscular BID Verne Spurr, PA-C   15 mg at 10/09/12 1705   Or  . haloperidol (HALDOL) tablet 15 mg  15 mg Oral BID Verne Spurr, PA-C   15 mg at 10/10/12 0747  . haloperidol (HALDOL) tablet 5 mg  5 mg Oral Q6H PRN Larena Sox, MD       Or  . haloperidol lactate (HALDOL) injection 5 mg  5 mg Intramuscular Q6H PRN Larena Sox, MD      . LORazepam (ATIVAN) injection 2 mg  2 mg Intramuscular Daily Verne Spurr, PA-C      . LORazepam (ATIVAN) tablet  2 mg  2 mg Oral Daily Mojeed Akintayo   2 mg at 10/10/12 0747   Or  . LORazepam (ATIVAN) injection 2 mg  2 mg Intramuscular Daily Mojeed Akintayo      . magnesium hydroxide (MILK OF MAGNESIA) suspension 30 mL  30 mL Oral Daily PRN Kerry Hough, PA      . OXcarbazepine (TRILEPTAL) tablet 150 mg  150 mg Oral BID Mojeed Akintayo        Lab Results: No results found for this or any previous visit (from the past 48 hour(s)).  Physical Findings: AIMS: Facial and Oral Movements Muscles of Facial Expression: None, normal Lips and Perioral Area: None, normal Jaw: None, normal Tongue: None, normal,Extremity Movements Upper (arms, wrists, hands, fingers): None, normal Lower (legs, knees, ankles, toes): None, normal, Trunk Movements Neck,  shoulders, hips: None, normal, Overall Severity Severity of abnormal movements (highest score from questions above): None, normal Incapacitation due to abnormal movements: None, normal Patient's awareness of abnormal movements (rate only patient's report): No Awareness, Dental Status Current problems with teeth and/or dentures?: No Does patient usually wear dentures?: No  CIWA:  CIWA-Ar Total: 0 COWS:  COWS Total Score: 2  Treatment Plan Summary: Daily contact with patient to assess and evaluate symptoms and progress in treatment Medication management Added Iron deficiency anemia to diagnosis list. Plan: 1. Will continue Haldol 15mg  po BID for delusions 2. Increase Depakene to 1000mg  oral suspension twice daily for mood stabilization. 3. Continue Cogentin 1mg  po BID for EPS prevention 4. Patient awaiting transfer to [facility] hospital 5. Patient encouraged to take her medications and participates in unit milieu. 6. She is still on the [facility] waitlist. Medical Decision Making Problem Points:  Established problem, worsening (2) and Review of psycho-social stressors (1) Data Points:  Review or order medicine tests (1)  I certify that inpatient services furnished can reasonably be expected to improve the patient's condition.   Rona Ravens. Emmalynn Pinkham RPAC 10/16/2012, 11:51 AM

## 2012-10-17 DIAGNOSIS — F609 Personality disorder, unspecified: Secondary | ICD-10-CM | POA: Diagnosis present

## 2012-10-17 MED ORDER — BENZTROPINE MESYLATE 1 MG PO TABS: 1.0000 mg | ORAL_TABLET | Freq: Two times a day (BID) | ORAL | Status: DC

## 2012-10-17 MED ORDER — HALOPERIDOL 5 MG PO TABS
15.0000 mg | ORAL_TABLET | Freq: Two times a day (BID) | ORAL | Status: DC
  Filled 2012-10-17: qty 84

## 2012-10-17 MED ORDER — LORAZEPAM 2 MG PO TABS: 2.0000 mg | ORAL_TABLET | Freq: Every day | ORAL | Status: DC

## 2012-10-17 MED ORDER — VALPROIC ACID 250 MG/5ML PO SYRP: 1000.0000 mg | ORAL_SOLUTION | Freq: Two times a day (BID) | ORAL | Status: DC

## 2012-10-17 MED ORDER — BENZTROPINE MESYLATE 1 MG PO TABS
1.0000 mg | ORAL_TABLET | Freq: Two times a day (BID) | ORAL | Status: DC
  Filled 2012-10-17 (×2): qty 28

## 2012-10-17 MED ORDER — VALPROIC ACID 250 MG/5ML PO SYRP
1000.0000 mg | ORAL_SOLUTION | Freq: Two times a day (BID) | ORAL | Status: DC
  Filled 2012-10-17: qty 200

## 2012-10-17 MED ORDER — HALOPERIDOL 10 MG PO TABS: 15.0000 mg | ORAL_TABLET | Freq: Two times a day (BID) | ORAL | Status: DC

## 2012-10-17 NOTE — Progress Notes (Signed)
Seen and agreed. Murray Guzzetta, MD 

## 2012-10-17 NOTE — Progress Notes (Signed)
Recreation Therapy Notes  Date: 02.19.2014  Time: 9:30am      Group Topic/Focus: Leisure Education  Participation Level: Did not attend   Mayvis Agudelo L Lael Wetherbee, LRT/CTRS 

## 2012-10-17 NOTE — Progress Notes (Signed)
Patient ID: Valerie Howard, female   DOB: [birth year], 37 y.o.   MRN: [id number] D: Pt. In dayroom lying on chair "I ain't hearing no voices, I ain't suicidal, I don't want to hurt anybody else" "I'm fine". A: Writer encourage pt. To verbalize concerns. Staff will monitor q40min for safety. Pt. Encouraged to attend group. R: Pt. Did not attend group. Pt. Is safe on the unit.

## 2012-10-17 NOTE — Progress Notes (Signed)
Patient ID: Valerie Howard, female   DOB: Jan 31, 1976, 37 y.o.   MRN: [id number] [facility] MD Progress Note  10/17/2012 2:08 PM DANICA CAMARENA  MRN:  [id number]  Subjective: Met with Valerie Howard and treatment team today to discuss her progress and response to treatment. She was asked about where she would go upon discharge but stated "I won't disclose that yet." As she is improving it is felt that she no longer meets the criteria for State hospitalization, she is asked to decide on a place she will go to upon discharge and she will be discharged tomorrow or Friday.   Diagnosis:  Schizophrenia, paranoid type                      Iron deficiency anemia ADL's:  Intact  Sleep: Fair  Appetite:  Fair  Suicidal Ideation:  denies Homicidal Ideation:  denies AEB (as evidenced by):patient's affect, verbal response when asked, and participation in unit programming. The patient continues to refuse to attend groups. Psychiatric Specialty Exam: Review of Systems  Constitutional: Negative.   HENT: Negative.   Eyes: Negative.   Respiratory: Negative.   Cardiovascular: Negative.   Gastrointestinal: Negative.   Genitourinary: Negative.   Musculoskeletal: Negative.   Skin: Negative.   Neurological: Negative.   Endo/Heme/Allergies: Negative.   Psychiatric/Behavioral:       Labile mood, agitated    Blood pressure 122/81, pulse 112, temperature 98.1 F (36.7 C), temperature source Oral, resp. rate 18, height 5\' 8"  (1.727 m), weight 65.772 kg (145 lb), last menstrual period 09/15/2012.Body mass index is 22.05 kg/(m^2).  General Appearance: casual  Eye Contact::  improved  Speech:  goal directed  Volume:  Increased  Mood:  Cooperative calm and almost pleasant.  Affect:  euthymic  Thought Process: linear  Orientation:  To time, place and person  Thought Content: patient still has fixed delusions  Suicidal Thoughts:  No  Homicidal Thoughts:  No  Memory:  NA  Judgement:  improving   Insight:  Lacking  Psychomotor Activity:  Normal  Concentration:  Poor  Recall:  Poor  Akathisia:  No  Handed:    AIMS (if indicated):     Assets:  Vocational/Educational  Sleep:  Number of Hours: 6.75   Current Medications: Current Facility-Administered Medications  Medication Dose Route Frequency Provider Last Rate Last Dose  . acetaminophen (TYLENOL) tablet 650 mg  650 mg Oral Q6H PRN Kerry Hough, PA      . alum & mag hydroxide-simeth (MAALOX/MYLANTA) 200-200-20 MG/5ML suspension 30 mL  30 mL Oral Q4H PRN Kerry Hough, PA      . benztropine (COGENTIN) tablet 1 mg  1 mg Oral BID Mojeed Akintayo   1 mg at 10/10/12 0748  . haloperidol lactate (HALDOL) injection 15 mg  15 mg Intramuscular BID Verne Spurr, PA-C   15 mg at 10/09/12 1705   Or  . haloperidol (HALDOL) tablet 15 mg  15 mg Oral BID Verne Spurr, PA-C   15 mg at 10/10/12 0747  . haloperidol (HALDOL) tablet 5 mg  5 mg Oral Q6H PRN Larena Sox, MD       Or  . haloperidol lactate (HALDOL) injection 5 mg  5 mg Intramuscular Q6H PRN Larena Sox, MD      . LORazepam (ATIVAN) injection 2 mg  2 mg Intramuscular Daily Verne Spurr, PA-C      . LORazepam (ATIVAN) tablet 2 mg  2 mg Oral Daily Mojeed Akintayo  2 mg at 10/10/12 0747   Or  . LORazepam (ATIVAN) injection 2 mg  2 mg Intramuscular Daily Mojeed Akintayo      . magnesium hydroxide (MILK OF MAGNESIA) suspension 30 mL  30 mL Oral Daily PRN Kerry Hough, PA      . OXcarbazepine (TRILEPTAL) tablet 150 mg  150 mg Oral BID Mojeed Akintayo        Lab Results: No results found for this or any previous visit (from the past 48 hour(s)).  Physical Findings: AIMS: Facial and Oral Movements Muscles of Facial Expression: None, normal Lips and Perioral Area: None, normal Jaw: None, normal Tongue: None, normal,Extremity Movements Upper (arms, wrists, hands, fingers): None, normal Lower (legs, knees, ankles, toes): None, normal, Trunk Movements Neck, shoulders,  hips: None, normal, Overall Severity Severity of abnormal movements (highest score from questions above): None, normal Incapacitation due to abnormal movements: None, normal Patient's awareness of abnormal movements (rate only patient's report): No Awareness, Dental Status Current problems with teeth and/or dentures?: No Does patient usually wear dentures?: No  CIWA:  CIWA-Ar Total: 0 COWS:  COWS Total Score: 2  Treatment Plan Summary: Daily contact with patient to assess and evaluate symptoms and progress in treatment Medication management Added Iron deficiency anemia to diagnosis list. Plan: 1. Will continue Haldol 15mg  po BID for delusions 2. Continue Depakene to 1000mg  oral suspension twice daily for mood stabilization. 3. Continue Cogentin 1mg  po BID for EPS prevention. 4. Patient encouraged to take her medications and participates in unit milieu. 5. Patient will formulate a plan for discharge and she will likely leave tomorrow. Medical Decision Making Problem Points:  Established problem, worsening (2) and Review of psycho-social stressors (1) Data Points:  Review or order medicine tests (1)  I certify that inpatient services furnished can reasonably be expected to improve the patient's condition.   Rona Ravens. Rein Popov RPAC 10/17/2012, 2:08 PM

## 2012-10-17 NOTE — Progress Notes (Signed)
D: Patient denies SI but is unwilling to tell staff if she is having HI and auditory or visual hallucinations. The patient has an irritable mood and affect. The patient isolates to her room frequently throughout the day and is refusing to have interaction within the milieu ("I don't want to talk with these people!"). The patient is still refusing to take an iron medication for her low hemoglobin.   A: Patient given emotional support from RN. Patient encouraged to come to staff with concerns and/or questions. Patient's medication routine continued. Patient's orders and plan of care reviewed. MD notified about patient's medication refusal.  R: Patient remains somewhat cooperative. Will continue to monitor patient q15 minutes for safety.

## 2012-10-17 NOTE — Progress Notes (Signed)
Psychoeducational Group Note  Date:  10/17/2012 Time:  2000  Group Topic/Focus:  Wrap-Up Group:   The focus of this group is to help patients review their daily goal of treatment and discuss progress on daily workbooks.  Participation Level: Did Not Attend  Participation Quality:  Not Applicable  Affect:  Not Applicable  Cognitive:  Not Applicable  Insight:  Not Applicable  Engagement in Group: Not Applicable  Additional Comments:  Pt did not attend  Taysen Bushart Lee 10/17/2012, 9:35 PM  

## 2012-10-17 NOTE — Progress Notes (Signed)
[facility] LCSW Aftercare Discharge Planning Group Note  10/17/2012 1:54 PM  Participation Quality:  Did not attend    [name] 10/17/2012, 1:54 PM

## 2012-10-17 NOTE — Clinical Social Work Note (Signed)
[facility] Group Notes:  (Counselor/Nursing/MHT/Case Management/Adjunct)  07/13/2012 12:00 PM  Type of Therapy:  Group Therapy  Participation Level:  Did Not Attend  Smart, Heather N 07/13/2012, 12:00 PM  

## 2012-10-18 MED ORDER — HALOPERIDOL DECANOATE 100 MG/ML IM SOLN
50.0000 mg | Freq: Once | INTRAMUSCULAR | Status: AC
  Administered 2012-10-18: 50 mg via INTRAMUSCULAR
  Filled 2012-10-18: qty 1
  Filled 2012-10-18: qty 0.5

## 2012-10-18 MED ORDER — DIPHENHYDRAMINE HCL 50 MG/ML IJ SOLN
50.0000 mg | Freq: Once | INTRAMUSCULAR | Status: AC
  Administered 2012-10-18: 50 mg via INTRAMUSCULAR
  Filled 2012-10-18 (×2): qty 1

## 2012-10-18 NOTE — Discharge Summary (Signed)
Physician Discharge Summary Note  Patient:  Valerie Howard is an 37 y.o., female MRN:  [id number] DOB:  05/18/1976 Patient phone:  [phone] (home)  Patient address:   [address] [place] [address],   Date of Admission:  09/25/2012 Date of Discharge: 10/18/2012  Reason for Admission:  IVC'd by husband  Discharge Diagnoses: Principal Problem:   Schizophrenia, paranoid type Active Problems:   Bipolar I disorder, most recent episode (or current) manic, severe, specified as with psychotic behavior   Anemia, iron deficiency   Tachycardia   Personality disorder Discharge Diagnoses:  AXIS I: Bipolar 1 Disorder-manic with psychotic features  AXIS II: Cluster B Traits  AXIS III:  Past Medical History   Diagnosis  Date   AXIS IV: other psychosocial or environmental problems, problems related to social environment and marital problem  AXIS V: 61-70 mild symptoms  Review of Systems  Constitutional: Negative.  Negative for fever, chills, weight loss, malaise/fatigue and diaphoresis.  HENT: Negative for congestion and sore throat.   Eyes: Negative for blurred vision, double vision and photophobia.  Respiratory: Negative for cough, shortness of breath and wheezing.   Cardiovascular: Negative for chest pain, palpitations and PND.  Gastrointestinal: Negative for heartburn, nausea, vomiting, abdominal pain, diarrhea and constipation.  Musculoskeletal: Negative for myalgias, joint pain and falls.  Neurological: Negative for dizziness, tingling, tremors, sensory change, speech change, focal weakness, seizures, loss of consciousness, weakness and headaches.  Endo/Heme/Allergies: Negative for polydipsia. Does not bruise/bleed easily.  Psychiatric/Behavioral: Negative for depression, suicidal ideas, hallucinations, memory loss and substance abuse. The patient is not nervous/anxious and does not have insomnia.    Level of Care:  OP  Hospital Course:  [name] was admitted for  the 4th time to [facility] after being involuntarily committed by her husband due to her paranoid and bizarre behaviors.  She was given medical clearance and transferred to [facility].      [name] was uncooperative, oppositional, irritable and hostile to staff and patients. She isolated to her room for the entire admission refusing to attend groups. [name] presented with fixed delusions and paranoid behaviors.  She was placed on the [facility] wait list.      She steadfastly refused medication so a second opinion was ordered and forced medication was started.  For her paranoia she was started on haldol 10mg  IM and increased to 10 mg BID.  She was also covered with cogentin for any EPS. She was also given Ativan 2mg  IM or PO with the Haldol to help with anxiety and irritability.  For mood stabilization she was started on Depakene solution and titrated to 1000mg  BID which she took po.      [name] did show some response to medication by becoming less irritable but still refused to participate in any unit programming and continued to refuse medication.  As she neared her 22 day of admission, she no longer met criteria for the state hospital, and did not meet criteria for continued in patient stay.  She was changed over to Haldol decoanate 50mg  the week before without negative sequela but also continued upon oral or IM injections of immediate release Haldol.      On the day of discharge [name] was given a second dose of Haldol Decoanate 50mg  and Benadryl 50 mg IM as well. She denied SI/HI, denied AVH, was in full contact with reality, but continued to hold on to her fixed delusion.  [name] was discharged to a local shelter as her family did not  want her to return home due to concerns for the children's safety.       Consults:  None  Significant Diagnostic Studies:  labs: please see all labs associated with this visit via EMR  Discharge Vitals:   Blood pressure 128/85, pulse 96, temperature 97.8 F (36.6 C), temperature source Oral,  resp. rate 17, height 5\' 8"  (1.727 m), weight 65.772 kg (145 lb), last menstrual period 09/15/2012. Body mass index is 22.05 kg/(m^2). Lab Results:   No results found for this or any previous visit (from the past 72 hour(s)).  Physical Findings: AIMS: Facial and Oral Movements Muscles of Facial Expression: None, normal Lips and Perioral Area: None, normal Jaw: None, normal Tongue: None, normal,Extremity Movements Upper (arms, wrists, hands, fingers): None, normal Lower (legs, knees, ankles, toes): None, normal, Trunk Movements Neck, shoulders, hips: None, normal, Overall Severity Severity of abnormal movements (highest score from questions above): None, normal Incapacitation due to abnormal movements: None, normal Patient's awareness of abnormal movements (rate only patient's report): No Awareness, Dental Status Current problems with teeth and/or dentures?: No Does patient usually wear dentures?: No  CIWA:  CIWA-Ar Total: 0 COWS:  COWS Total Score: 2  Psychiatric Specialty Exam: See Psychiatric Specialty Exam and Suicide Risk Assessment completed by Attending Physician prior to discharge.  Discharge destination:  Home  Is patient on multiple antipsychotic therapies at discharge:  No   Has Patient had three or more failed trials of antipsychotic monotherapy by history:  No  Recommended Plan for Multiple Antipsychotic Therapies: Not applicable   Discharge Orders   Future Orders Complete By Expires     Diet - low sodium heart healthy  As directed     Discharge instructions  As directed     Comments:      Take all your medications as prescribed by your mental healthcare provider. Report any adverse effects and or reactions from your medicines to your outpatient provider promptly. Patient is instructed and cautioned to not engage in alcohol and or illegal drug use while on prescription medicines. In the event of worsening symptoms, patient is instructed to call the crisis hotline,  911 and or go to the nearest ED for appropriate evaluation and treatment of symptoms. Follow-up with your primary care provider for your other medical issues, concerns and or health care needs.    Increase activity slowly  As directed         Medication List    TAKE these medications     Indication   benztropine 1 MG tablet  Commonly known as:  COGENTIN  Take 1 tablet (1 mg total) by mouth 2 (two) times daily.   Indication:  Extrapyramidal Reaction caused by Medications     haloperidol 10 MG tablet  Commonly known as:  HALDOL  Take 1.5 tablets (15 mg total) by mouth 2 (two) times daily. For mental clarity.   Indication:  Psychosis     LORazepam 2 MG tablet  Commonly known as:  ATIVAN  Take 1 tablet (2 mg total) by mouth daily.   Indication:  Feeling Anxious     Valproic Acid 250 MG/5ML Syrp syrup  Commonly known as:  DEPAKENE  Take 20 mLs (1,000 mg total) by mouth 2 (two) times daily. For mood stabilization.   Indication:  Manic-Depression         Follow-up recommendations:  [facility]  Comments:  Due to [facility] fixed delusional state, her need for forced medication, lack of cooperation, oppositional behaviors, she will need a  higher level of care should she need readmission in the near future.   A plan of care with this information will be included in her EMR.  Total Discharge Time:  Greater than 30 minutes.  Signed: Rona Ravens. Shonte Soderlund RPAC 3:57 PM 10/19/2012

## 2012-10-18 NOTE — Progress Notes (Signed)
Psychoeducational Group Note  Date:  10/18/2012 Time:  0930  Group Topic/Focus:  Self Esteem Action Plan:   The focus of this group is to help patients create a plan to continue to build self-esteem after discharge.  Participation Level: Did Not Attend  Participation Quality:  Not Applicable  Affect:  Not Applicable  Cognitive:  Not Applicable  Insight:  Not Applicable  Engagement in Group: Not Applicable  Additional Comments:  Pt refused to attend group this morning, instead decided to stand in the hall while group was in progress.  Maniah Nading E 10/18/2012, 11:31 AM
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
## 2012-10-18 NOTE — Progress Notes (Signed)
[facility] Adult Case Management Discharge Plan :  Will you be returning to the same living situation after discharge: No. At discharge, do you have transportation home?:Yes,  bus pass Do you have the ability to pay for your medications:Yes,  mental health  Release of information consent forms completed and in the chart;  Patient's signature needed at discharge.  Patient to Follow up at: Follow-up Information   Follow up with [facility]. (Go to the walk-in clinic M-F between 8 and 9AM for your hospital follow up appointment)    Contact information:   [address]  [place]  [phone]      Patient denies SI/HI:   Yes,  yes    Safety Planning and Suicide Prevention discussed:  Yes,  yes  [name] 10/18/2012, 11:32 AM

## 2012-10-18 NOTE — Progress Notes (Signed)
Patient ID: Valerie Howard, female   DOB: 08-04-1976, 37 y.o.   MRN: [id number] Patient discharged home per MD order.  Patient was given a bus pass; she asked how far she could get since she was attempting to get to [employer].  Patient was not going to go a shelter as was originally discussed.  Patient called her 79 year old daughter and left a message that her mother was being discharged and that "I don't know what Im walking into."  Patient was lethargic and sleepy from a shot she received earlier today.  She received a haldol deconate injection 50 mg along with a benedryl 50 mg IM.  She stated that she has no intentions of taking her medications or continuing with her follow up appointments.  Patient denies any SI/HI/AVH.  She received her belongings; signed her discharge instructions and left for the bus stop without incident.

## 2012-10-19 NOTE — BH Assessment (Signed)
This patient has a plan of care.  Please read the plan of care prior to seeking placement. Thank you, Lloyd Huger T. Rahim Astorga [facility] 10/19/2012 3:58 PM

## 2012-10-19 NOTE — Plan of Care (Signed)
Valerie Howard    [id number]    1975-10-19     10/19/2012   3:59 PM   Valerie Howard is a 37 year old mother of 4 who has had 4 admissions to [facility] in the last 4 months all of which are involuntary.  She is medically non-compliant and suffers from a fixed delusion, paranoia, and personality disorder, and has been diagnosed as bipolar as well.  During her 4 in patient Psychiatric admissions she is resistant to medication, isolates to her room, hostile to staff and other patients, and refuses to cooperate with most directives. She has no family support as she has alienated most of them and does not have any friends on whom she can rely.  Due to the severity of her illness, personality disorder, and medical non-compliance she is at an increased risk for readmission despite "forced medication orders" and being in a therapeutic miliue. She will need a higher level of care and should be recommended for state hospitalization upon admission to the Psychiatric ED.  Retaining her in the Psychiatric ED will put her at a higher priority for admission to the state hospital than being transferred to in patient care.  Should Cissy need readmission for psychiatric illness our recommendations are as follows.  1. Restart medications as quickly as possible.  A force medication order should be requested as soon as possible if the patient is refusing medication.  IM medications to start with a goal of moving to long acting IM antipsychotic medications to improve patient's compliance. (see most recent discharge medications on AVS).  2. Placing the patient on the [facility] wait list from the ED is recommended as it will place her at a higher priority for admission to [facility].  ANY in patient admission will lower her priority level for [facility].  It is suggested that she remain in the ED.  3. Consider an Out Patient IVC order for continued medication compliance is also recommended.  4. ED LCSW should contact the LME as  soon as possible to assist this patient with access to services that will reduce her risk for readmission.  Rona Ravens. Kresha Abelson [facility] 10/19/2012 4:17 PM

## 2012-10-23 NOTE — Progress Notes (Signed)
Patient Discharge Instructions:  After Visit Summary (AVS):   Faxed to:  10/23/12 Discharge Summary Note:   Faxed to:  10/23/12 Psychiatric Admission Assessment Note:   Faxed to:  10/23/12 Suicide Risk Assessment - Discharge Assessment:   Faxed to:  10/23/12 Faxed/Sent to the Next Level Care provider:  10/23/12 Faxed to [facility] @ [phone]  Jerelene Redden, 10/23/2012, 3:52 PM

## 2012-10-25 NOTE — Discharge Summary (Signed)
Seen and agreed. Hamlet Lasecki, MD 

## 2014-05-12 DIAGNOSIS — N921 Excessive and frequent menstruation with irregular cycle: Secondary | ICD-10-CM | POA: Diagnosis present

## 2014-05-12 DIAGNOSIS — N920 Excessive and frequent menstruation with regular cycle: Secondary | ICD-10-CM | POA: Diagnosis present

## 2015-07-05 ENCOUNTER — Encounter (HOSPITAL_COMMUNITY): Payer: Self-pay | Admitting: Nurse Practitioner

## 2015-07-05 ENCOUNTER — Emergency Department (HOSPITAL_COMMUNITY)
Admission: EM | Admit: 2015-07-05 | Discharge: 2015-07-18 | Disposition: A | Discharge status: Home / Self Care | Payer: BC Managed Care – PPO | Attending: Physician Assistant | Admitting: Physician Assistant

## 2015-07-05 DIAGNOSIS — Z880 Allergy status to penicillin: Secondary | ICD-10-CM | POA: Insufficient documentation

## 2015-07-05 DIAGNOSIS — R45851 Suicidal ideations: Secondary | ICD-10-CM

## 2015-07-05 DIAGNOSIS — Z79899 Other long term (current) drug therapy: Secondary | ICD-10-CM | POA: Insufficient documentation

## 2015-07-05 DIAGNOSIS — F312 Bipolar disorder, current episode manic severe with psychotic features: Secondary | ICD-10-CM | POA: Insufficient documentation

## 2015-07-05 LAB — CBC WITH DIFFERENTIAL/PLATELET
Basophils Absolute: 0 10*3/uL (ref 0.0–0.1)
Basophils Relative: 0 %
EOS PCT: 0 %
Eosinophils Absolute: 0 10*3/uL (ref 0.0–0.7)
HCT: 31.5 % — ABNORMAL LOW (ref 36.0–46.0)
Hemoglobin: 9.7 g/dL — ABNORMAL LOW (ref 12.0–15.0)
LYMPHS ABS: 2.1 10*3/uL (ref 0.7–4.0)
LYMPHS PCT: 41 %
MCH: 24.6 pg — AB (ref 26.0–34.0)
MCHC: 30.8 g/dL (ref 30.0–36.0)
MCV: 79.7 fL (ref 78.0–100.0)
MONO ABS: 0.4 10*3/uL (ref 0.1–1.0)
MONOS PCT: 7 %
Neutro Abs: 2.6 10*3/uL (ref 1.7–7.7)
Neutrophils Relative %: 52 %
PLATELETS: 376 10*3/uL (ref 150–400)
RBC: 3.95 MIL/uL (ref 3.87–5.11)
RDW: 15.7 % — AB (ref 11.5–15.5)
WBC: 5.1 10*3/uL (ref 4.0–10.5)

## 2015-07-05 LAB — BASIC METABOLIC PANEL
Anion gap: 8 (ref 5–15)
BUN: 14 mg/dL (ref 6–20)
CHLORIDE: 108 mmol/L (ref 101–111)
CO2: 26 mmol/L (ref 22–32)
CREATININE: 0.99 mg/dL (ref 0.44–1.00)
Calcium: 9.3 mg/dL (ref 8.9–10.3)
GFR calc Af Amer: >60 mL/min (ref >60)
GFR calc non Af Amer: >60 mL/min (ref >60)
Glucose, Bld: 110 mg/dL — ABNORMAL HIGH (ref 65–99)
Potassium: 4 mmol/L (ref 3.5–5.1)
Sodium: 142 mmol/L (ref 135–145)

## 2015-07-05 LAB — RAPID URINE DRUG SCREEN, HOSP PERFORMED
AMPHETAMINES: NOT DETECTED
Barbiturates: NOT DETECTED
Benzodiazepines: NOT DETECTED
Cocaine: NOT DETECTED
Opiates: NOT DETECTED
Tetrahydrocannabinol: NOT DETECTED

## 2015-07-05 LAB — ETHANOL: Alcohol, Ethyl (B): <5 mg/dL (ref <5)

## 2015-07-05 MED ORDER — LORAZEPAM 1 MG PO TABS: 1.0000 mg | ORAL_TABLET | Freq: Three times a day (TID) | ORAL | Status: DC | PRN

## 2015-07-05 MED ORDER — ACETAMINOPHEN 325 MG PO TABS: 650.0000 mg | ORAL_TABLET | ORAL | Status: DC | PRN

## 2015-07-05 NOTE — Progress Notes (Signed)
D Pt. Presents to SAPPU angry and irritable, stating "I should not be here". Writer ask pt. If she was having any pain or discomfort. Pt. States " I do not want to talk about it". Pt. Refuses to answer any further questions.  Writer offered food and fluids.  Pt. Admits she is hungry, and when Probation officer gives list of things to eat "Pt. States I don't care I'm just hungry. Writer took pt. A sandwich and a drink.  Pt. Did not want the cranberry juice, but still refused to state  Pt. what she would drink, writer returned with sprite.  Pt. 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  Writer ask tech to take pt. A Gatorade which she accepted.  Pt. Refused medication.  Pt. Remains safe on the unit.
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
## 2015-07-05 NOTE — ED Notes (Addendum)
Pt is presented by [employer] after [employer] paper work which indicates spouse took on pt was issued on her stating that she is a danger to herself. Pt is refuting all these and states she has "nothing wrong with her." Although cooperative, pt is obviously agitated and anxious.Chart review indicates extensive psychiatric hx.

## 2015-07-05 NOTE — ED Provider Notes (Signed)
CSN: [id number]     Arrival date & time 07/05/15  2047 History  By signing my name below, I, Valerie Howard, attest that this documentation has been prepared under the direction and in the presence of [name], PA-C Electronically Signed: Soijett Howard, ED Scribe. 07/05/2015. 9:25 PM.   Chief Complaint  Patient presents with  . IVC      LEVEL 5 CAVEAT: PSYCHIATRIC DISORDER  The history is provided by the patient, medical records and the police (IVC petition paperwork). No language interpreter was used.     Valerie Howard is a 39 y.o. female with a medical hx of paranoid behavior who presents to the Emergency Department brought in by [facility] petition for medical clearance. Pt had IVC paperwork taken out on her by her husband for being a danger to herself. Upon arrival to room, pt states "I don't need to be here" and won't answer any further questions. Per IVC petition paperwork, she was recently committed for paranoia and non-compliance with her medications. Per petition pt is acting paranoid and feeling as if people are out to get her and fits of anger. Pt does not offer any history at this time. She denies any other symptoms.   Past Medical History  Diagnosis Date  . Paranoid behavior    Past Surgical History  Procedure Laterality Date  . Cesarean section     Family History  Problem Relation Age of Onset  . Hypertension Other   . Diabetes Other   . Cancer Other    Social History  Substance Use Topics  . Smoking status: Never Smoker   . Smokeless tobacco: Not on file  . Alcohol Use: No   OB History    No data available     Review of Systems  Unable to perform ROS: Psychiatric disorder      Allergies  Penicillins  Home Medications   Prior to Admission medications   Medication Sig Start Date End Date Taking? Authorizing Provider  benztropine (COGENTIN) 1 MG tablet Take 1 tablet (1 mg total) by mouth 2 (two) times daily. 10/17/12   Ruben Im, PA-C  haloperidol  (HALDOL) 10 MG tablet Take 1.5 tablets (15 mg total) by mouth 2 (two) times daily. For mental clarity. 10/17/12   Ruben Im, PA-C  LORazepam (ATIVAN) 2 MG tablet Take 1 tablet (2 mg total) by mouth daily. 10/17/12   Ruben Im, PA-C  Valproic Acid (DEPAKENE) 250 MG/5ML SYRP syrup Take 20 mLs (1,000 mg total) by mouth 2 (two) times daily. For mood stabilization. 10/17/12   Ruben Im, PA-C   BP 142/97 mmHg  Pulse 99  Temp(Src) 98.3 F (36.8 C) (Oral)  Resp 19  SpO2 100% Physical Exam  Constitutional: She is oriented to person, place, and time. She appears well-developed and well-nourished. No distress.  HENT:  Head: Normocephalic and atraumatic.  Eyes: EOM are normal.  Neck: Neck supple.  Cardiovascular: Normal rate.   Pulmonary/Chest: Effort normal. No respiratory distress.  Musculoskeletal: Normal range of motion.  Neurological: She is alert and oriented to person, place, and time.  Skin: Skin is warm and dry.  Psychiatric: She has a normal mood and affect. Her behavior is normal.  Nursing note and vitals reviewed.   ED Course  Procedures (including critical care time) DIAGNOSTIC STUDIES: Oxygen Saturation is 100% on RA, nl by my interpretation.    COORDINATION OF CARE: 9:25 PM Discussed treatment plan with pt at bedside which includes medical clearance  procedure and pt agreed to plan.    Labs Review Labs Reviewed - No data to display  Imaging Review No results found. I have personally reviewed and evaluated these lab results as part of my medical decision-making.   EKG Interpretation None      MDM   Final diagnoses:  None    1. Paranoid behavior 2. IVC  Patient will be assessed by TTS consultation for inpatient care.  I personally performed the services described in this documentation, which was scribed in my presence. The recorded information has been reviewed and is accurate.     [name], PA-C 07/05/15 [place],  PA-C 07/05/15 [place], MD 07/05/15 [id number]

## 2015-07-05 NOTE — BH Assessment (Signed)
Assessment completed. Pt did not actively participate it the assessment. Consulted Arlester Marker, NP who recommended a psychiatric evaluation in the morning. Informed [name], PA-C of the recommendation.

## 2015-07-06 DIAGNOSIS — F312 Bipolar disorder, current episode manic severe with psychotic features: Secondary | ICD-10-CM

## 2015-07-06 MED ORDER — TRAZODONE HCL 100 MG PO TABS: 100.0000 mg | ORAL_TABLET | Freq: Every evening | ORAL | Status: DC | PRN

## 2015-07-06 MED ORDER — OLANZAPINE 10 MG PO TABS
10.0000 mg | ORAL_TABLET | Freq: Two times a day (BID) | ORAL | Status: DC
  Filled 2015-07-06 (×2): qty 1

## 2015-07-06 NOTE — Consult Note (Signed)
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 Schizophrenia, paranoid type ([place]) [F20.0] 12/07/2011    Priority: High    Class: Acute  . Personality disorder [F60.9] 10/17/2012  . Anemia, iron deficiency [D50.9] 10/13/2012  . Tachycardia [R00.0] 10/01/2012    Total Time spent with patient: 45 minutes  Subjective:   Valerie Howard is a 39 y.o. female patient admitted with hallucination, anger and mood lability.  HPI: Valerie Howard is an 39 y.o. female who was brought to [facility] after she was  petitioned by her spouse. Patient is uncooperative stated "I am really not in the mood for all of this" and refusing to answer questions.  Collateral information was gathered from pt's ex husband who reported that pt has been off of her medications for more than 2 weeks. He reported that pt was recently discharged from a 90 day program at [facility]. He reports that patient has started to get easily agitated, paranoid and imagining that people are trying to hurt her. Patient has is irritable, labile and refusing to follow directions. Patient has no insight and exercising poor judgment, she will benefit from inpatient admission for stabilization. Past Psychiatric History: Paranoid Schizophrenia, Bipolar 1 disorder-manic  Risk to Self: Suicidal Ideation:  (Unable to assess ) Suicidal Intent:  (Unable to assess ) Is patient at risk for suicide?:  (Unable to assess. ) Suicidal Plan?:  (Unable to assess ) Access to Means:  (Unable to assess ) What has been your use of drugs/alcohol within the  last 12 months?:  (Unable to assess ) How many times?:  (Unable to assess ) Other Self Harm Risks:  (Unable to assess ) Triggers for Past Attempts:  (Unable to assess) Intentional Self Injurious Behavior:  (Unable to assess ) Risk to Others: Homicidal Ideation:  (Unable to assess ) Thoughts of Harm to Others:  (Unable to assess ) Current Homicidal Intent:  (Unable to assess ) Current Homicidal Plan:  (Unable to assess) Access to Homicidal Means:  (Unable to assess ) Identified Victim:  (Unable to assess ) History of harm to others?:  (Unable to assess ) Assessment of Violence:  (Unable to assess ) Violent Behavior Description:  (Unable to assess ) Does patient have access to weapons?:  (Unable to assess ) Criminal Charges Pending?:  (Unable to assess ) Does patient have a court date:  (Unable to assess ) Prior Inpatient Therapy: Prior Inpatient Therapy: Yes Prior Outpatient Therapy: Prior Outpatient Therapy:  (Unable to assess ) Does patient have an ACCT team?: Unknown Does patient have Intensive In-House Services?  : Unknown Does patient have [facility] services? : Unknown Does patient have P4CC services?: Unknown  Past Medical History:  Past Medical History  Diagnosis Date  . Paranoid behavior ([place])     Past Surgical History  Procedure Laterality Date  . Cesarean section     Family History:  Family History  Problem Relation Age of Onset  . Hypertension Other   . Diabetes Other   . Cancer Other    Family Psychiatric  History: patient refused to answer Social History:  History  Alcohol  Use No     History  Drug Use No    Social History   Social History  . Marital Status: Legally Separated    Spouse Name: N/A  . Number of Children: N/A  . Years of Education: N/A   Social History Main Topics  . Smoking status: Never Smoker   . Smokeless tobacco: None  . Alcohol Use: No  . Drug Use: No  . Sexual Activity: No   Other Topics Concern  . None   Social History  Narrative   Additional Social History:    History of alcohol / drug use?:  (unable to assess)                     Allergies:   Allergies  Allergen Reactions  . Penicillins Rash    Has patient had a PCN reaction causing immediate rash, facial/tongue/throat swelling, SOB or lightheadedness with hypotension:Patient refuses to answer (PRA) Has patient had a PCN reaction causing severe rash involving mucus membranes or skin necrosis:PRA Has patient had a PCN reaction that required hospitalization PRA Has patient had a PCN reaction occurring within the last 10 years:PRA If all of the above answers are "NO", then may proceed with Cephalosporin use.     Labs:  Results for orders placed or performed during the hospital encounter of 07/05/15 (from the past 48 hour(s))  Basic metabolic panel     Status: Abnormal   Collection Time: 07/05/15  9:45 PM  Result Value Ref Range   Sodium 142 135 - 145 mmol/L   Potassium 4.0 3.5 - 5.1 mmol/L   Chloride 108 101 - 111 mmol/L   CO2 26 22 - 32 mmol/L   Glucose, Bld 110 (H) 65 - 99 mg/dL   BUN 14 6 - 20 mg/dL   Creatinine, Ser 0.99 0.44 - 1.00 mg/dL   Calcium 9.3 8.9 - 10.3 mg/dL   GFR calc non Af Amer >60 >60 mL/min   GFR calc Af Amer >60 >60 mL/min    Comment: (NOTE) The eGFR has been calculated using the CKD EPI equation. This calculation has not been validated in all clinical situations. eGFR's persistently <60 mL/min signify possible Chronic Kidney Disease.    Anion gap 8 5 - 15  Ethanol     Status: None   Collection Time: 07/05/15  9:45 PM  Result Value Ref Range   Alcohol, Ethyl (B) <5 <5 mg/dL    Comment:        LOWEST DETECTABLE LIMIT FOR SERUM ALCOHOL IS 5 mg/dL FOR MEDICAL PURPOSES ONLY   CBC with Differential     Status: Abnormal   Collection Time: 07/05/15  9:45 PM  Result Value Ref Range   WBC 5.1 4.0 - 10.5 K/uL   RBC 3.95 3.87 - 5.11 MIL/uL   Hemoglobin 9.7 (L) 12.0 - 15.0 g/dL   HCT 31.5 (L) 36.0 - 46.0 %    MCV 79.7 78.0 - 100.0 fL   MCH 24.6 (L) 26.0 - 34.0 pg   MCHC 30.8 30.0 - 36.0 g/dL   RDW 15.7 (H) 11.5 - 15.5 %   Platelets 376 150 - 400 K/uL   Neutrophils Relative % 52 %   Neutro Abs 2.6 1.7 - 7.7 K/uL   Lymphocytes Relative 41 %   Lymphs Abs 2.1 0.7 - 4.0 K/uL   Monocytes Relative 7 %   Monocytes Absolute 0.4 0.1 - 1.0 K/uL   Eosinophils Relative 0 %   Eosinophils Absolute 0.0  0.0 - 0.7 K/uL   Basophils Relative 0 %   Basophils Absolute 0.0 0.0 - 0.1 K/uL  Urine rapid drug screen (hosp performed)     Status: None   Collection Time: 07/05/15 10:15 PM  Result Value Ref Range   Opiates NONE DETECTED NONE DETECTED   Cocaine NONE DETECTED NONE DETECTED   Benzodiazepines NONE DETECTED NONE DETECTED   Amphetamines NONE DETECTED NONE DETECTED   Tetrahydrocannabinol NONE DETECTED NONE DETECTED   Barbiturates NONE DETECTED NONE DETECTED    Comment:        DRUG SCREEN FOR MEDICAL PURPOSES ONLY.  IF CONFIRMATION IS NEEDED FOR ANY PURPOSE, NOTIFY LAB WITHIN 5 DAYS.        LOWEST DETECTABLE LIMITS FOR URINE DRUG SCREEN Drug Class       Cutoff (ng/mL) Amphetamine      1000 Barbiturate      200 Benzodiazepine   710 Tricyclics       626 Opiates          300 Cocaine          300 THC              50     Current Facility-Administered Medications  Medication Dose Route Frequency Provider Last Rate Last Dose  . acetaminophen (TYLENOL) tablet 650 mg  650 mg Oral Q4H PRN [name], PA-C      . LORazepam (ATIVAN) tablet 1 mg  1 mg Oral Q8H PRN Shari Upstill, PA-C      . OLANZapine (ZYPREXA) tablet 10 mg  10 mg Oral BID PC Almer Bushey      . traZODone (DESYREL) tablet 100 mg  100 mg Oral QHS PRN Jet Traynham       Current Outpatient Prescriptions  Medication Sig Dispense Refill  . OLANZapine (ZYPREXA) 10 MG tablet Take 10 mg by mouth 2 (two) times daily. LAST FILLED 06/29/2015.      Musculoskeletal: Strength & Muscle Tone: within normal limits Gait & Station:  normal Patient leans: N/A  Psychiatric Specialty Exam: Review of Systems  Constitutional: Negative.   HENT: Negative.   Eyes: Negative.   Respiratory: Negative.   Cardiovascular: Negative.   Gastrointestinal: Negative.   Genitourinary: Negative.   Musculoskeletal: Negative.   Skin: Negative.   Neurological: Negative.   Endo/Heme/Allergies: Negative.   Psychiatric/Behavioral: The patient is nervous/anxious and has insomnia.     Blood pressure 143/85, pulse 94, temperature 98.1 F (36.7 C), temperature source Oral, resp. rate 19, SpO2 100 %.There is no weight on file to calculate BMI.  General Appearance: Casual  Eye Contact::  Good  Speech:  loud  Volume:  Increased  Mood:  Angry and Irritable  Affect:  Labile  Thought Process:  Circumstantial, Disorganized and Loose  Orientation:  Full (Time, Place, and Person)  Thought Content:  Paranoid Ideation  Suicidal Thoughts:  unable to asseess  Homicidal Thoughts:  unable to asseess  Memory:  Immediate;   Good Recent;   Good Remote;   Good  Judgement:  Impaired  Insight:  Lacking  Psychomotor Activity:  agitation  Concentration:  Fair  Recall:  Good  Fund of Knowledge:Good  Language: Good  Akathisia:  No  Handed:  Right  AIMS (if indicated):     Assets:  Housing Physical Health Social Support  ADL's:  Intact  Cognition: WNL  Sleep:   poor   Treatment Plan Summary: Daily contact with patient to assess and evaluate symptoms and progress in treatment.  Medication  management: Start Olanzapine 38m bid for mood stabilization/delusion  Disposition: Recommend psychiatric Inpatient admission when medically cleared. [facility]ibly [facility]  ACorena Pilgrim MD 07/06/2015 10:50 AM

## 2015-07-06 NOTE — ED Notes (Addendum)
Patient alert and orientedx4. She states she doesn't need to be here and says she's only here because her ex-husband doesn't want her in the house. She has poor insight. Husband states she's been off her medication. She would not speak to Probation officer but when doctors came by she only spoke to them so she could leave. When they came by she denied SI/HI/AVH. Whenever this Probation officer asked the patient questions she stated she was done playing these games. She was repeatedly been uncooperative throughout the day.

## 2015-07-07 NOTE — ED Notes (Signed)
Patient denis SI, HI and AVH at this time. Patient appears irritable at this time. Patient stated " I don't need nothing form you so you can just leave". Encouragement and support provided safety maintain. Q 15 min safety checks remain in place.

## 2015-07-07 NOTE — ED Notes (Signed)
Patient denies SI and HI at this time. Patient admits AVH at this time. Plan of care discussed and patient voices no complaints or concerns at this time. Encouragement and support provided and safety maintain. Q 15 min safety checks remain in place.
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
## 2015-07-07 NOTE — Progress Notes (Addendum)
14:35 update: received call back from [place] at [facility], states pt's most recent pulse and BP reading need to be addressed, requests documentation of such so that she can proceed with reviewing referral. Informed [facility] TTS of request, will relay information to medical staff. Will fax documentation when available.  Sharren Bridge, MSW, LCSW Clinical Social Work, Disposition  07/07/2015 [phone]   Completed regional referral form and obtained authorization #[id number] from [place].  Faxed referral to [facility] and spoke with Sonya to give verbal referral information. Awaiting [facility] review of referral for disposition.   Sharren Bridge, MSW, LCSW Clinical Social Work, Disposition  07/07/2015 [phone]

## 2015-07-07 NOTE — ED Notes (Signed)
Refused vitals RN [place] aware

## 2015-07-08 DIAGNOSIS — R45851 Suicidal ideations: Secondary | ICD-10-CM | POA: Insufficient documentation

## 2015-07-08 MED ORDER — AMLODIPINE BESYLATE 5 MG PO TABS
5.0000 mg | ORAL_TABLET | Freq: Every day | ORAL | Status: DC
  Filled 2015-07-08 (×11): qty 1

## 2015-07-08 NOTE — Consult Note (Signed)
  Attempted to evaluate patient this am and address her elevated blood pressure.  Patient walked out on the providers without saying a word.  Patient walked back in and refused to speak to this provider.  She turned her back on this writer and covered her body from head to toe.  She is on  [facility] waiting list.  However, she is refusing BP medication prescribed for her.  We will continue to monitor patient.  Valerie Howard   PMHNP-BC Patient seen face-to-face for psychiatric evaluation, chart reviewed and case discussed with the physician extender and developed treatment plan. Reviewed the information documented and agree with the treatment plan. Corena Pilgrim, MD

## 2015-07-08 NOTE — Progress Notes (Signed)
Pt visible in milieu at intervals for majority of this shift. Affect is blunted with irritable and labile mood. Pt has been resistive towards care, uncooperative, demanding and argumentative throughout this shift. Pt refused all scheduled medications when offered. Denied SI, HI, AVH and pain after repeating writer's assessment questions back and forth when asked. Continued support, encouragement and availability provided to this pt. Verbal education offered on the importance of treatment compliance (meds, vitals) but to no avail as pt continues to refuse care when approached. Q 15 minutes checks maintained for safety.

## 2015-07-08 NOTE — Progress Notes (Signed)
Pending review for possible placement with [facility].  

## 2015-07-09 NOTE — BH Assessment (Signed)
[place] Assessment Progress Note  Pt was re-assessed by TTS.  Pt was lying on the bed with covers over her head, and did uncover her head when writer came in the room.  Writer introduced self, and pt stated, "I don't want to talk to you, I don't need to be here, and I don't need mental health treatment". Pt refused to answer questions or talk any further with Probation officer. Pt has refused any medications. TTS continues to seek placement.

## 2015-07-09 NOTE — Progress Notes (Signed)
1045 Discussed at [place] [facility]-d/c [facility] discussed  Recommendations: can pt benefit from any injectable meds since she has been noncompliance with taking meds

## 2015-07-09 NOTE — ED Notes (Signed)
Pt refused vitals. RN notified

## 2015-07-09 NOTE — ED Notes (Signed)
Pt very irritable and refusing morning medications. Pt refused to answer assessment questions, and told writer "just get out of here and stop harassing me." Writer reinforced the importance of following medication regimen and the need to maintain a safe environment through regular nursing assessments.

## 2015-07-09 NOTE — ED Notes (Signed)
Pt continues to refuse medication and vital sign assessment. When writer attempted to discuss issue with pt she stated "maybe you need some pills" and followed up with  "kiss my a**". Pt continues to be irritable and non-compliant with medication and assessment regimen.

## 2015-07-09 NOTE — Progress Notes (Signed)
CM spoke with pt who said her name was "Valerie Howard" when Cm entered her room and stated "[name]"  Pt asked "what do you want?'' CM informed pt wh oCM was and asked if she had a pcp Pt refused to inform CM and stated "Y'all ain't answering my questions so I ain't answering y'all questions"    CM left in pt room, provided written information for uninsured accepting pcps, discussed the importance of pcp vs EDP services for f/u care, www.needymeds.org, www.goodrx.com, discounted pharmacies and other [employer] such as [employer], affordable care act, financial assistance, uninsured dental services, [place] med assist, DSS and  health department  Reviewed resources for [employer] uninsured accepting pcps like [facility], family medicine at [employer], [facility], [facility], local urgent care centers, [facility], [facility] family practice, general medical clinics, family services of the [place], [facility] urgent care plus others, medication resources, [facility] out patient pharmacies and housing Provided [employer]

## 2015-07-09 NOTE — ED Notes (Signed)
Patient affect is blunted with irritable and labile mood. Patient denies SI, HI and AVH at this time. Encouragement and support provided and safety maintain. Q 15 min safety checks remain in place.

## 2015-07-09 NOTE — ED Notes (Signed)
Pt labile and dismissive of RN on approach. RN asked pt if she needed anything to drink or a snack, pt responded "If I need anything, I will tell you. You don't bother me with your questions. You can leave me alone!". Pt agitated and refusing to answer any assessment questions and asked RN to leave her room. Pt also refusing to have vitals taken. No s/s of distress noted. Pt later talking on the phone without incident.

## 2015-07-09 NOTE — Progress Notes (Signed)
Barbara at [facility] called to state pt has been added to waiting list.  Sharren Bridge, MSW, [place] Work, Disposition  07/09/2015 [phone]

## 2015-07-10 NOTE — BH Assessment (Signed)
[place] Assessment Progress Note  At 14:40 Valerie Howard at [facility] reports that pt remains on their wait list.  [name], MA Triage Specialist [phone]

## 2015-07-10 NOTE — ED Notes (Signed)
Pt noted in the hallway. Pt at this time continues to disrupt the milieu; loud, cursing; however, not physically threatening. Will continue to monitor with security cameras. Q 15 minute rounds also continues.

## 2015-07-10 NOTE — ED Notes (Signed)
Pt noted in room. Pt at this time is disrupting the milieu; loud, cursing; however, no aggression. Will continue to monitor with security cameras. Q 15 minute rounds also continues.

## 2015-07-10 NOTE — BHH Counselor (Signed)
06/30/2015 Reassessment:  Writer attempted to reassess patient but she was uncooperative. Writer approached patients room and she stated, "I don't need any of your services, I don't need a counselor, I have nothing to discuss, and it's nothing wrong with me". Writer unable to confirm and/or verify SI, HI, and patient is experiencing any AVH's.

## 2015-07-10 NOTE — ED Notes (Signed)
Pt remains irritable and non-compliant with medication and assessment regimen. When scheduled medication was offered, pt told writer "Why don't you kiss my a**." Pt forwards little.

## 2015-07-10 NOTE — ED Notes (Signed)
Patient has been irritable and paranoid the entire shift thus far. On first entering the patient's room this am, she told undersigned to leave and that she had nothing to say to me. Encouraged her to come to staff if needing anything. Patient has been accusing undersigned of trying to make her stay with her ex and trying to play games with her. Refusing medications thus far today. Not physically aggressive today but has been verbally aggressive at times.

## 2015-07-11 NOTE — ED Notes (Signed)
Pt. Noted in room. No complaints or concerns voiced. No distress or abnormal behavior noted. Will continue to monitor with security cameras. Q 15 minute rounds continue. 

## 2015-07-11 NOTE — ED Notes (Signed)
Pt noted in room in bed with eye closed. No complaints or concerns voiced. No distress or abnormal behavior noted. Will continue to monitor with security cameras. Q 15 minute rounds continue.

## 2015-07-11 NOTE — BHH Counselor (Signed)
Per Dr. Louretta Shorten and Reginold Agent, NP, patient meets criteria for inpatient treatment. Patient referred to several facilities including [facility]. Per [place], pt is on [facility] wait list. Auth# [id number].

## 2015-07-11 NOTE — ED Notes (Addendum)
Patient continues to be irritable and beligerant.    Demanding a tray when she has been given one. Offered a sandwich and she declined.  Unable to verbally redirect.

## 2015-07-11 NOTE — ED Notes (Signed)
Patient is irritable and unwilling to discuss POC or have a therapeutic discussion.  Continues to refuse scheduled medications.

## 2015-07-12 NOTE — BH Assessment (Signed)
Patient was reassessed by TTS.   Patient states "I don't need your services, I don't need behavioral health." Patient refused to answer questions about SI/HI/AVH. Patient laid in her bed and faced the wall. Patient did not turn around and answer questions.  Patient continues to meet inpatient criteria per Dr. Darleene Cleaver.   Patient remains on [facility] waitlist.   Rosalin Hawking, LCSW Therapeutic Triage Specialist [place] 07/12/2015 11:05 AM

## 2015-07-12 NOTE — ED Notes (Signed)
Patient approached the nursing desk and stated "I want to know my rights. I am divorced, so I know the doctor will never see me. I need to leave, but I'm not married". Pt labile and agitated without provocation. Pt quickly returned to her room; will continue to monitor behaviors.

## 2015-07-12 NOTE — Progress Notes (Signed)
3:33pm. Dr. Darleene Cleaver places patient under IVC. Magistrate Arnoldo Morale states paperwork received and approved, and Event organiser to be dispatched shortly.  [place] Worker [facility] phone: [phone]

## 2015-07-12 NOTE — ED Notes (Signed)
New IVC paperwork filed in hard chart. CSW informed.

## 2015-07-12 NOTE — ED Notes (Signed)
Patient paranoid and refusing to speak to nurse on approach. Pt labile and angry with an unknown person on the phone. Pt with no s/s of distress noted at this time.

## 2015-07-12 NOTE — ED Notes (Signed)
Patient refused vitals signs 11:49 am.

## 2015-07-13 NOTE — BH Assessment (Signed)
[facility] Assessment Progress Note  At 08:34 Garnet Sierras at [facility] reports that pt remains on their wait list.  [name], [place] Triage Specialist [phone]

## 2015-07-13 NOTE — ED Notes (Signed)
Patient in day room watching TV at the beginning of the shift. Her mood and affect irritable and angry. She stated ; " I want to leave, I'm not suppose to be here". She appeared irritable, rude and controlling. Patient refused to answer ant of the assessment questions. Writer encourages patient to report concern if any. q 15 minute check continues as ordered for safety.

## 2015-07-13 NOTE — ED Notes (Signed)
Patient wants her television changed every 5 minutes and will curse at you and say nasty things to you when you are in the room.

## 2015-07-13 NOTE — ED Notes (Signed)
Patient asked for a sprite with no ice and for her television to be turned off. Writer went in room to turn television off she started yelling asking "are you going to change channel of what"? Printmaker and said she was told she wanted it off, and asked what channel she wanted it on she kept yelling saying she knows what I am doing and  she not suppose to be here we are going to keep playing with her and she is going to blow up. She walked out of the room and went to the bathroom to throw her cup away but did not say anything. Writer had left out of her room and explained to the nurse. Patient then came up to the desk yelling is she going to change my channel or not? I am not playing with her she said I am 39 something years old, the nurse then got up and went in her room and changed it

## 2015-07-13 NOTE — ED Notes (Signed)
Patient refused vitals and also explained to me that her name is Valerie Howard and she see what is going on here and I do not need to watch her do not come to her room RN [facility] made aware

## 2015-07-13 NOTE — ED Notes (Signed)
Patient has been very demanding, rude, entitled and verbally aggressive towards staff all night. She continuously come to the window to request for remote control and want staff to wait in her room until she is able to make up her mind and decide what channel she wanted to watch. She refused her HS medications. She have been refusing medications per report. Her thought process is tangential and disorganized. She appeared angry and repeatedly said she wasn't suppose to be in the hospital.  Although patient would benefit from taking her medications. Q 15 minute checks continues as ordered for safety.

## 2015-07-13 NOTE — ED Notes (Signed)
Patient has been up in the dayroom most of the day.  She continues to be irritable and refuses to answer all questions, take medications or have her vital signs checked.  She has been on the phone multiple times today making calls to her husband, attorneys, banks and others.  Then when her husband called her she hung up on him and asked me to have him stop calling her.  I gave him the message.  She was quite loud this morning with pressured and tangential speech.

## 2015-07-13 NOTE — BHH Counselor (Signed)
Reassessment note:   Pt was angry and hostile upon approach. When asked how she was doing she states "I don't need behavioral services". When asked if she was SI, HI or A/V she states "don't start with me". When asked if she needs anything she states "I've already told you what I need please leave".   Bedelia Person, M.S., LPCA, [place], [facility] Licensed Professional Counselor Associate  Triage Specialist  [facility]  Therapeutic Triage Services Phone: [phone] Fax: [phone]

## 2015-07-14 NOTE — ED Notes (Signed)
Patient  Is at the desk Chiropractor names and saying things that is inappropriate to staff. Writer has tried to accommodate patient with changing the television, when patient says she does not know what she likes, Probation officer has offered to give her a television guide and give her time to see what channel she would like to see. Patient refused and stated being verbally mean.

## 2015-07-14 NOTE — ED Notes (Signed)
Patient asked Probation officer to turn on her television, Probation officer went in the room and asked patient if she knew what she might have wanted to watch. Patient started yelling at [place] saying she does  not watch television  and she has a Restaurant manager, fast food degree and do not talk to her like she is stupid. Patient is very verbally mean to the staff she will call staff names, scream and yell. Writer has not been able to obtain vitals on on patient. Nor has Probation officer been able to do anything to try and make patient comfortable.

## 2015-07-14 NOTE — Progress Notes (Signed)
Patient's thought process disorganized. She brought copy pf her commitment papers to the window. She asked Probation officer to read it. Writer told patient that we have a copy in her chart and I have read the copy. " does it makes any sense to you". She continues to be tangential, goes from one subject to another. She said she is not suppose to be here. Writer encouraged patient to take her medications and the better. Patient stated; " I don't take medicine, I don't need medicine, I just want to go home". Q 15 minute check continues for safety.

## 2015-07-14 NOTE — Progress Notes (Signed)
Writer offered patient medication to help her sleep tonight. Patient stated," I don't take medications". She refused. Patient continues to be verbally aggressive, disorganized and not investing in treatment. Q 15 minute check continues for safety.

## 2015-07-14 NOTE — BH Assessment (Signed)
[place] Assessment Progress Note  At 09:50 Valerie Howard at [facility] confirms that pt remains on their wait list.  [name], MA Triage Specialist [phone]

## 2015-07-14 NOTE — ED Notes (Signed)
Patient remained in her room within the past one hour until now. She came and requested fro blanket, towels, wash cloth and new scrubs for tomorrow.

## 2015-07-14 NOTE — Progress Notes (Signed)
Patient at the Hallway pacing at the beginning of the shift. Writer attempted to assess patient, she just walked pass Probation officer and did not answer or said a word. Shortly, patient walked to the nursing station and said; " Can I use the phone"?  Per report patient has been on the phone all day and every time she was on the phone she was coursing out whoever she was talking to and became increasingly aggressive. Physician then gave an order to limit her phone privileges to twice a day. Patient knew about this before change of shift.  Writer told patient she already had more than 2 phone privileges and she is not allowed to use the phone again. She became very loud and verbally aggressive, demanding and would not listen. Staff called security department. Writer will call PA on call for aggression protocol if patient continues to escalate and aggressive. Q 15 minute check continue for safety

## 2015-07-14 NOTE — BH Assessment (Signed)
Reassessment 07/14/2015:  Writer attempted to complete a re-assessment on patient today. Writer approached patient but patient refused to remain in the room. Patient walked pass this Probation officer. Writer called patient's name 2x's and she refused to acknowledge this Probation officer. Patient has not been cooperative with answering any of the staffs questions. Writer ended the reassessment with this patient. Writer reported by to psychiatry team that patient is uncooperative with completing a TTS assessment. Per Dr. Darleene Cleaver and Reginold Agent, NP patient to remain in the ED awaiting placement at [facility].

## 2015-07-14 NOTE — Progress Notes (Signed)
Patient woke up this morning and walked pass the nursing station to the bathroom and back to her room. As soon as she got to her room she pressed the bell. Writer went to patient room to check if she needed anything. Patient stated; "bring the remote and change the channell". She turned her back from the TV while Probation officer was changing the channel. Probation officer. She remain irritable, angry, demanding and none compliant to treatment or anything she's asked to do.

## 2015-07-15 NOTE — ED Notes (Signed)
Pt refused evening V/S, nurse notified.

## 2015-07-15 NOTE — BH Assessment (Signed)
Reassessment 07/15/2015:  Writer met with patient to complete a face to face assessment. Patient immediately stated, "I don't need no counselor and I don't need your services good bye". Patient requesting to discharge home. The MHT who was present at this time tried to encourage patient to participate and cooperative with answering questions. Patient ignored both of use as she laid in her bed to read a book. Writer unable to confirm if patient is SI, HI, and/or experiencing any AVH's. Per Dr. Darleene Cleaver and Reginold Agent, NP patient to remain in the ED for inpatient placement at [facility].

## 2015-07-15 NOTE — ED Notes (Signed)
Patient slept for most part of the night. Her mood less irritable when she woke up and she was less argumentative. She used the bathroom and went back to her room.

## 2015-07-15 NOTE — ED Notes (Signed)
Valerie Howard continues to choose not to interact with staff.  She had her two phone calls today and managed to make 2 more before we cut the phone off.  She has been rude to staff.  She continues also to refuse all vital signs, medications and assessments.  Has been reading in her room some today.

## 2015-07-16 NOTE — ED Notes (Signed)
Pt. Noted in room. No complaints or concerns voiced. No distress or abnormal behavior noted. Will continue to monitor with security cameras. Q 15 minute rounds continue. 

## 2015-07-16 NOTE — Progress Notes (Addendum)
Pt alert, visible in dayroom and on phone at intervals during this shift. Pt's mood is labile, she's very irritated and intrusive. Observed to be verbally abusive towards Probation officer, other nursing staff and security unprovoked. Pt stated to writer "fuck y'all damn bitches, I'm not taking no damn medications, get the fuck away from my face" when approached for scheduled AM medications. Pt showered and changed scrubs earlier this shift. Pt had minimal nutritional intake this shift, refused breakfast and lunch. Continued support, encouragement and availability offered throughout this shift. Safety maintained on Q 15 minutes checks as ordered without physical distress or self injurious behavior to report at this time.

## 2015-07-16 NOTE — ED Notes (Signed)
Pt refused vitals 

## 2015-07-16 NOTE — ED Notes (Signed)
Patient refused vital signs. Reported refusal to nurse.

## 2015-07-16 NOTE — ED Notes (Signed)
Patient refused vitals..reported refusal to nurse.

## 2015-07-16 NOTE — Consult Note (Signed)
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 Suicidal ideation [R45.851]   . Personality disorder [F60.9] 10/17/2012  . Anemia, iron deficiency [D50.9] 10/13/2012  . Tachycardia [R00.0] 10/01/2012  . Schizophrenia, paranoid type ([place]) [F20.0] 12/07/2011    Class: Acute    Total Time spent with patient: 30 minutes  Subjective:   Valerie Howard is a 39 y.o. female patient admitted with Manic symptoms, Agitation, Medication non compliant   HPI: AA female, 39 years old was approached for evaluation this morning.  This is one of several attempts by staff members to speak with patient but has failed.   Patient has been in our ER behavioral section for two weeks waiting for placement at [facility].  She has refused to speak with providers and this morning repeated questions asked of her at the providers.  Patient threatens a law suit because she is held against her will.  Patient lacks insight in her mental illness, recently spent 3 months at [facility] and came out and has not been taking her medications.   We will continue to seek [place] placement at this time.  Past Psychiatric History:  Bipolar 1 disorder, Schizophrenia, Paranoid type, Personality disorder  Risk to Self: Suicidal Ideation:  (Unable to assess ) Suicidal Intent:  (Unable to assess ) Is patient at risk for suicide?:  (Unable to assess. ) Suicidal Plan?:  (Unable to assess ) Access to Means:  (Unable to assess ) What has been your use of drugs/alcohol within the last 12 months?:  (Unable to assess ) How many times?:  (Unable to assess ) Other  Self Harm Risks:  (Unable to assess ) Triggers for Past Attempts:  (Unable to assess) Intentional Self Injurious Behavior:  (Unable to assess ) Risk to Others: Homicidal Ideation:  (Unable to assess ) Thoughts of Harm to Others:  (Unable to assess ) Current Homicidal Intent:  (Unable to assess ) Current Homicidal Plan:  (Unable to assess) Access to Homicidal Means:  (Unable to assess ) Identified Victim:  (Unable to assess ) History of harm to others?:  (Unable to assess ) Assessment of Violence:  (Unable to assess ) Violent Behavior Description:  (Unable to assess ) Does patient have access to weapons?:  (Unable to assess ) Criminal Charges Pending?:  (Unable to assess ) Does patient have a court date:  (Unable to assess ) Prior Inpatient Therapy: Prior Inpatient Therapy: Yes Prior Outpatient Therapy: Prior Outpatient Therapy:  (Unable to assess ) Does patient have an ACCT team?: Unknown Does patient have Intensive In-House Services?  : Unknown Does patient have [facility] services? : Unknown Does patient have P4CC services?: Unknown  Past Medical History:  Past Medical History  Diagnosis Date  . Paranoid behavior ([place])     Past Surgical History  Procedure Laterality Date  . Cesarean section     Family History:  Family History  Problem Relation Age of Onset  . Hypertension Other   . Diabetes Other   . Cancer Other    Family Psychiatric  History: Unknown Social History:  History  Alcohol Use No     History  Drug Use No    Social History   Social History  . Marital Status: Legally Separated    Spouse Name: N/A  . Number of Children: N/A  . Years of Education: N/A   Social History Main Topics  . Smoking status: Never Smoker   . Smokeless tobacco: None  . Alcohol Use: No  . Drug Use: No  . Sexual Activity: No   Other Topics Concern  . None   Social History Narrative   Additional Social History:    History of alcohol / drug use?:  (unable to assess)       Allergies:   Allergies  Allergen Reactions  . Penicillins Rash    Has patient had a PCN reaction causing immediate rash, facial/tongue/throat swelling, SOB or lightheadedness with hypotension:Patient refuses to answer (PRA) Has patient had a PCN reaction causing severe rash involving mucus membranes or skin necrosis:PRA Has patient had a PCN reaction that required hospitalization PRA Has patient had a PCN reaction occurring within the last 10 years:PRA If all of the above answers are "NO", then may proceed with Cephalosporin use.     Labs: No results found for this or any previous visit (from the past 48 hour(s)).  Current Facility-Administered Medications  Medication Dose Route Frequency Provider Last Rate Last Dose  . acetaminophen (TYLENOL) tablet 650 mg  650 mg Oral Q4H PRN [name], PA-C      . amLODipine (NORVASC) tablet 5 mg  5 mg Oral Daily Xadrian Craighead   5 mg at 07/08/15 1310  . LORazepam (ATIVAN) tablet 1 mg  1 mg Oral Q8H PRN [name], PA-C      . OLANZapine (ZYPREXA) tablet 10 mg  10 mg Oral BID PC Mahala Rommel   10 mg at 07/06/15 1728  . traZODone (DESYREL) tablet 100 mg  100 mg Oral QHS PRN Donevin Sainsbury       Current Outpatient Prescriptions  Medication Sig Dispense Refill  . OLANZapine (ZYPREXA) 10 MG tablet Take 10 mg by mouth 2 (two) times daily. LAST FILLED 06/29/2015.      Musculoskeletal: Strength & Muscle Tone: within normal limits Gait & Station: normal Patient leans: N/A  Psychiatric Specialty Exam: Review of Systems  Unable to perform ROS: mental acuity    Blood pressure 122/73, pulse 89, temperature 98.5 F (36.9 C), temperature source Oral, resp. rate 18, SpO2 100 %.There is no weight on file to calculate BMI.  General Appearance: Casual  Eye Contact::  Poor  Speech:  Pressured  Volume:  Normal  Mood:  Angry, Anxious and Irritable  Affect:  Congruent and Flat  Thought Process:  unable to obtain.  Patient spoke just a  sentence.  Orientation:  Other:  unable to obtain, did not want to participate in the interview.  Thought Content:  unable to obtain  Suicidal Thoughts:  unable to obtain  Homicidal Thoughts:  unable to obtain  Memory:  unable to obtain  Judgement:  Poor  Insight:  Lacking  Psychomotor Activity:  Normal  Concentration:  Poor  Recall:  unable to obtain  Fund of Knowledge:Poor  Language: Only repeated the questions asked by providers at the providers.  Akathisia:  No  Handed:  Right  AIMS (if indicated):     Assets:  Desire for Improvement  ADL's:  Impaired  Cognition: WNL  Sleep:      Treatment Plan Summary: Daily contact with patient to assess and evaluate symptoms and progress in treatment and Medication management  Disposition:  Continue plan of care, [place] placement, Continue to offer medications.  Delfin Gant   PMHNP-BC 07/16/2015 11:30 AM Patient seen face-to-face for psychiatric evaluation, chart reviewed and case discussed with the physician extender and developed treatment plan. Reviewed the information documented and agree with the treatment plan. Corena Pilgrim, MD

## 2015-07-17 NOTE — ED Notes (Signed)
Pt refused vitals 

## 2015-07-17 NOTE — Consult Note (Signed)
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 Suicidal ideation [R45.851]   . Personality disorder [F60.9] 10/17/2012  . Anemia, iron deficiency [D50.9] 10/13/2012  . Tachycardia [R00.0] 10/01/2012  . Schizophrenia, paranoid type ([place]) [F20.0] 12/07/2011    Class: Acute    Total Time spent with patient: 30 minutes  Subjective:   Valerie Howard is a 39 y.o. female patient admitted with Manic symptoms, Agitation, Medication non compliant   HPI: Patient continues to be resistive to treatment.  Hyper-religious, disorganized, irritable, "leave me alone."  She wants the social worker to find her a job, house, and a car.  Starts speaking a different language at one point.  [facility] Wait list but may go any psychiatric facility that will take her.    Past Psychiatric History:  Bipolar 1 disorder, Schizophrenia, Paranoid type, Personality disorder  Risk to Self: Suicidal Ideation:  (Unable to assess ) Suicidal Intent:  (Unable to assess ) Is patient at risk for suicide?:  (Unable to assess. ) Suicidal Plan?:  (Unable to assess ) Access to Means:  (Unable to assess ) What has been your use of drugs/alcohol within the last 12 months?:  (Unable to assess ) How many times?:  (Unable to assess ) Other Self Harm Risks:  (Unable to assess ) Triggers for Past Attempts:  (Unable to assess) Intentional Self Injurious Behavior:  (Unable to assess ) Risk to Others: Homicidal Ideation:  (Unable to assess ) Thoughts of Harm to Others:  (Unable to assess ) Current Homicidal Intent:  (Unable to assess  ) Current Homicidal Plan:  (Unable to assess) Access to Homicidal Means:  (Unable to assess ) Identified Victim:  (Unable to assess ) History of harm to others?:  (Unable to assess ) Assessment of Violence:  (Unable to assess ) Violent Behavior Description:  (Unable to assess ) Does patient have access to weapons?:  (Unable to assess ) Criminal Charges Pending?:  (Unable to assess ) Does patient have a court date:  (Unable to assess ) Prior Inpatient Therapy: Prior Inpatient Therapy: Yes Prior Outpatient Therapy: Prior Outpatient Therapy:  (Unable to assess ) Does patient have an ACCT team?: Unknown Does patient have Intensive In-House Services?  : Unknown Does patient have [facility] services? : Unknown Does patient have P4CC services?: Unknown  Past Medical History:  Past Medical History  Diagnosis Date  . Paranoid behavior ([place])     Past Surgical History  Procedure Laterality Date  . Cesarean section     Family History:  Family History  Problem Relation Age of Onset  . Hypertension Other   . Diabetes Other   . Cancer Other    Family Psychiatric  History: Unknown Social History:  History  Alcohol Use No     History  Drug Use No    Social History   Social History  . Marital Status: Legally Separated    Spouse Name: N/A  . Number of Children: N/A  . Years of Education: N/A   Social History Main Topics  . Smoking status: Never Smoker   . Smokeless tobacco: None  . Alcohol Use: No  .  Drug Use: No  . Sexual Activity: No   Other Topics Concern  . None   Social History Narrative   Additional Social History:    History of alcohol / drug use?:  (unable to assess)      Allergies:   Allergies  Allergen Reactions  . Penicillins Rash    Has patient had a PCN reaction causing immediate rash, facial/tongue/throat swelling, SOB or lightheadedness with hypotension:Patient refuses to answer (PRA) Has patient had a PCN reaction causing severe rash involving mucus  membranes or skin necrosis:PRA Has patient had a PCN reaction that required hospitalization PRA Has patient had a PCN reaction occurring within the last 10 years:PRA If all of the above answers are "NO", then may proceed with Cephalosporin use.     Labs: No results found for this or any previous visit (from the past 48 hour(s)).  Current Facility-Administered Medications  Medication Dose Route Frequency Provider Last Rate Last Dose  . acetaminophen (TYLENOL) tablet 650 mg  650 mg Oral Q4H PRN [name], PA-C      . amLODipine (NORVASC) tablet 5 mg  5 mg Oral Daily Demetrion Wesby   5 mg at 07/08/15 1310  . LORazepam (ATIVAN) tablet 1 mg  1 mg Oral Q8H PRN [name], PA-C      . OLANZapine (ZYPREXA) tablet 10 mg  10 mg Oral BID PC Pilar Westergaard   10 mg at 07/06/15 1728  . traZODone (DESYREL) tablet 100 mg  100 mg Oral QHS PRN Angline Schweigert       Current Outpatient Prescriptions  Medication Sig Dispense Refill  . OLANZapine (ZYPREXA) 10 MG tablet Take 10 mg by mouth 2 (two) times daily. LAST FILLED 06/29/2015.      Musculoskeletal: Strength & Muscle Tone: within normal limits Gait & Station: normal Patient leans: N/A  Psychiatric Specialty Exam: Review of Systems  Unable to perform ROS: mental acuity    Blood pressure 122/73, pulse 89, temperature 98.5 F (36.9 C), temperature source Oral, resp. rate 18, SpO2 100 %.There is no weight on file to calculate BMI.  General Appearance: Casual  Eye Contact::  Poor  Speech:  Pressured  Volume:  Normal  Mood:  Angry, Anxious and Irritable  Affect:  Congruent and Flat  Thought Process:  Disorganized  Orientation: alert and oriented x 3  Thought Content:  Delusions, hyper-religious  Suicidal Thoughts:  no  Homicidal Thoughts:  no  Memory:  Fair  Judgement:  Poor  Insight:  Lacking  Psychomotor Activity:  Normal  Concentration:  Poor  Recall:  unable to obtain  [employer] of Knowledge:Poor  Language: Fair  Akathisia:  No   Handed:  Right  AIMS (if indicated):     Assets:  resilience   ADL's:  Impaired  Cognition: WNL  Sleep:      Treatment Plan Summary: Daily contact with patient to assess and evaluate symptoms and progress in treatment and Medication management:  Bipolar disorder with psychotic features: -Crisis stabilization -Medication management:  No changes at this time--Ativan 1 mg every 8 hours PRN anxiety/agitation, Zyprexa 10 mg BID for psychosis, and Trazodone 100 mg at bedtime for sleep issues--non-compliant with her medications -Individual counseling  Disposition:  Continue plan of care, [place] placement, Continue to offer medications.  Waylan Boga   PMHNP-BC 07/17/2015 1:49 PM Patient seen face-to-face for psychiatric evaluation, chart reviewed and case discussed with the physician extender and developed treatment plan. Reviewed the information documented and agree with the treatment plan. Alizay Bronkema,  MD

## 2015-07-17 NOTE — Progress Notes (Signed)
D Pt. Refuses to answer any of writers questions.  Pt. Will only respond "I should not be here I don't need you".  A Writer offered support and encouragement.  R Pt. Remains safe on the unit.  Pt. Is presently resting quietly.

## 2015-07-17 NOTE — ED Notes (Signed)
Patient refused vitals. Reported refusal to nurse.

## 2015-07-17 NOTE — ED Notes (Signed)
Patient continues to remain in room and only come to staff if she needs something. Refusing to answer any pertinent questions even about placement from NP and psychiatrist. Continues to make verbal threats and curse staff at times. No physical aggression thus far today. Refuses to take medications and vital signs. Says that she doesn't need to be here. Still makes paranoid accusations and talks about an ex. Saying that we cannot make her go back with him. No insight into mental illness.

## 2015-07-17 NOTE — BH Assessment (Signed)
[facility] Assessment Progress Note  At 09:51 Gae Bon at [facility] confirms that pt remains on their wait list.  [name], [place] Triage Specialist [phone]

## 2015-07-17 NOTE — ED Notes (Signed)
Still refuses meds and vital signs. Looks at staff angrily and stuck her middle finger up at staff earlier. Not talking to staff unless she wants something like changing the TV channel.

## 2015-07-18 NOTE — Consult Note (Signed)
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 Suicidal ideation [R45.851]   . Personality disorder [F60.9] 10/17/2012  . Anemia, iron deficiency [D50.9] 10/13/2012  . Tachycardia [R00.0] 10/01/2012  . Schizophrenia, paranoid type ([place]) [F20.0] 12/07/2011    Class: Acute    Total Time spent with patient: 30 minutes  Subjective:   Valerie Howard is a 39 y.o. female patient does not warrant admission.  HPI:  39 yo female who has been to the ED but has been refusing all medications and treatments.  She has been sleeping, eating, and watching television without issues.  Mysty remains irritable and hateful to staff.  She will not accept treatment or medications because "I don't need any."  Denies suicidal/homicidal ideations, hallucinations, and alcohol/drug abuse.  She has not been aggressive towards the staff, other clients, or herself since admission.  No threat to herself or others.  Personality disorder appears to be dominating the situation.    Past Psychiatric History: bipolar disorder  Risk to Self: Suicidal Ideation:  (Unable to assess ) Suicidal Intent:  (Unable to assess ) Is patient at risk for suicide?:  (Unable to assess. ) Suicidal Plan?:  (Unable to assess ) Access to Means:  (Unable to assess ) What has been your use of drugs/alcohol within the last 12 months?:  (Unable to assess ) How many times?:  (Unable to assess ) Other Self Harm Risks:  (Unable to assess ) Triggers for Past Attempts:  (Unable to assess) Intentional Self Injurious Behavior:  (Unable to assess ) Risk to Others: Homicidal Ideation:   (Unable to assess ) Thoughts of Harm to Others:  (Unable to assess ) Current Homicidal Intent:  (Unable to assess ) Current Homicidal Plan:  (Unable to assess) Access to Homicidal Means:  (Unable to assess ) Identified Victim:  (Unable to assess ) History of harm to others?:  (Unable to assess ) Assessment of Violence:  (Unable to assess ) Violent Behavior Description:  (Unable to assess ) Does patient have access to weapons?:  (Unable to assess ) Criminal Charges Pending?:  (Unable to assess ) Does patient have a court date:  (Unable to assess ) Prior Inpatient Therapy: Prior Inpatient Therapy: Yes Prior Outpatient Therapy: Prior Outpatient Therapy:  (Unable to assess ) Does patient have an ACCT team?: Unknown Does patient have Intensive In-House Services?  : Unknown Does patient have [facility] services? : Unknown Does patient have P4CC services?: Unknown  Past Medical History:  Past Medical History  Diagnosis Date  . Paranoid behavior ([place])     Past Surgical History  Procedure Laterality Date  . Cesarean section     Family History:  Family History  Problem Relation Age of Onset  . Hypertension Other   . Diabetes Other   . Cancer Other    Family Psychiatric  History: Unknown Social History:  History  Alcohol Use No     History  Drug Use No    Social History   Social History  . Marital Status: Legally Separated    Spouse Name: N/A  . Number of Children: N/A  . Years of Education: N/A   Social History  Main Topics  . Smoking status: Never Smoker   . Smokeless tobacco: None  . Alcohol Use: No  . Drug Use: No  . Sexual Activity: No   Other Topics Concern  . None   Social History Narrative   Additional Social History:    History of alcohol / drug use?:  (unable to assess)                     Allergies:   Allergies  Allergen Reactions  . Penicillins Rash    Has patient had a PCN reaction causing immediate rash, facial/tongue/throat swelling, SOB  or lightheadedness with hypotension:Patient refuses to answer (PRA) Has patient had a PCN reaction causing severe rash involving mucus membranes or skin necrosis:PRA Has patient had a PCN reaction that required hospitalization PRA Has patient had a PCN reaction occurring within the last 10 years:PRA If all of the above answers are "NO", then may proceed with Cephalosporin use.     Labs: No results found for this or any previous visit (from the past 48 hour(s)).  Current Facility-Administered Medications  Medication Dose Route Frequency Provider Last Rate Last Dose  . acetaminophen (TYLENOL) tablet 650 mg  650 mg Oral Q4H PRN [name], PA-C      . amLODipine (NORVASC) tablet 5 mg  5 mg Oral Daily Hamish Banks   5 mg at 07/08/15 1310  . LORazepam (ATIVAN) tablet 1 mg  1 mg Oral Q8H PRN [name], PA-C      . OLANZapine (ZYPREXA) tablet 10 mg  10 mg Oral BID PC Sameka Bagent   10 mg at 07/06/15 1728  . traZODone (DESYREL) tablet 100 mg  100 mg Oral QHS PRN Geovana Gebel       Current Outpatient Prescriptions  Medication Sig Dispense Refill  . OLANZapine (ZYPREXA) 10 MG tablet Take 10 mg by mouth 2 (two) times daily. LAST FILLED 06/29/2015.      Musculoskeletal: Strength & Muscle Tone: within normal limits Gait & Station: normal Patient leans: N/A  Psychiatric Specialty Exam: Review of Systems  Constitutional: Negative.   HENT: Negative.   Eyes: Negative.   Respiratory: Negative.   Cardiovascular: Negative.   Gastrointestinal: Negative.   Genitourinary: Negative.   Musculoskeletal: Negative.   Skin: Negative.   Neurological: Negative.   Endo/Heme/Allergies: Negative.   Psychiatric/Behavioral:       None    Blood pressure 122/73, pulse 89, temperature 98.5 F (36.9 C), temperature source Oral, resp. rate 18, SpO2 100 %.There is no weight on file to calculate BMI.  General Appearance: Casual  Eye Contact::  Good  Speech:  Normal Rate  Volume:  Normal  Mood:   Angry  Affect:  Congruent  Thought Process:  Coherent  Orientation:  Full (Time, Place, and Person)  Thought Content:  WDL  Suicidal Thoughts:  No  Homicidal Thoughts:  No  Memory:  Immediate;   Good Recent;   Good Remote;   Good  Judgement:  Fair  Insight:  Fair  Psychomotor Activity:  Normal  Concentration:  Good  Recall:  Good  Fund of Knowledge:Good  Language: Good  Akathisia:  No  Handed:  Right  AIMS (if indicated):     Assets:  Leisure Time Physical Health Resilience  ADL's:  Intact  Cognition: WNL  Sleep:      Treatment Plan Summary: Daily contact with patient to assess and evaluate symptoms and progress in treatment, Medication management and Plan bipolar disorder: -Crisis stabilization -Patient  refuses medications  Disposition: No evidence of imminent risk to self or others at present.    Waylan Boga, PMH-NP 07/18/2015 1:25 PM Patient seen face-to-face for psychiatric evaluation, chart reviewed and case discussed with the physician extender and developed treatment plan. Reviewed the information documented and agree with the treatment plan. Corena Pilgrim, MD

## 2015-07-18 NOTE — BHH Suicide Risk Assessment (Signed)
Suicide Risk Assessment  Discharge Assessment   [facility] Discharge Suicide Risk Assessment   Demographic Factors:  NA  Total Time spent with patient: 30 minutes  Musculoskeletal: Strength & Muscle Tone: within normal limits Gait & Station: normal Patient leans: N/A  Psychiatric Specialty Exam: Review of Systems  Constitutional: Negative.   HENT: Negative.   Eyes: Negative.   Respiratory: Negative.   Cardiovascular: Negative.   Gastrointestinal: Negative.   Genitourinary: Negative.   Musculoskeletal: Negative.   Skin: Negative.   Neurological: Negative.   Endo/Heme/Allergies: Negative.   Psychiatric/Behavioral:       None    Blood pressure 122/73, pulse 89, temperature 98.5 F (36.9 C), temperature source Oral, resp. rate 18, SpO2 100 %.There is no weight on file to calculate BMI.  General Appearance: Casual  Eye Contact::  Good  Speech:  Normal Rate  Volume:  Normal  Mood:  Angry  Affect:  Congruent  Thought Process:  Coherent  Orientation:  Full (Time, Place, and Person)  Thought Content:  WDL  Suicidal Thoughts:  No  Homicidal Thoughts:  No  Memory:  Immediate;   Good Recent;   Good Remote;   Good  Judgement:  Fair  Insight:  Fair  Psychomotor Activity:  Normal  Concentration:  Good  Recall:  Good  Fund of Knowledge:Good  Language: Good  Akathisia:  No  Handed:  Right  AIMS (if indicated):     Assets:  Leisure Time Physical Health Resilience  ADL's:  Intact  Cognition: WNL  Sleep:         Has this patient used any form of tobacco in the last 30 days? (Cigarettes, Smokeless Tobacco, Cigars, and/or Pipes) No  Mental Status Per Nursing Assessment::   On Admission:   Bipolar disorder  Current Mental Status by Physician: NA  Loss Factors: NA  Historical Factors: NA  Risk Reduction Factors:   Sense of responsibility to family and Positive coping skills or problem solving skills  Continued Clinical Symptoms:  Irritable  Cognitive Features  That Contribute To Risk:  None    Suicide Risk:  Minimal: No identifiable suicidal ideation.  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 Suicidal ideation [R45.851]   . Personality disorder [F60.9] 10/17/2012  . Anemia, iron deficiency [D50.9] 10/13/2012  . Tachycardia [R00.0] 10/01/2012  . Schizophrenia, paranoid type ([place]) [F20.0] 12/07/2011    Class: Acute    Follow-up Information    Schedule an appointment as soon as possible for a visit with Please use the resources provided to you in emergency room by case manager to assist with doctor for follow up .   Why:  As needed   Contact information:   These [place] uninsured resources provide possible primary care providers, resources for discounted medications, housing, dental resources, affordable care act information, plus other resources for [place] recommendations:  Activity:  as tolerated Diet:  heart healthy diet  Is patient on multiple antipsychotic therapies at discharge:  No   Has Patient had three or more failed trials of antipsychotic monotherapy by history:  No  Recommended Plan  for Multiple Antipsychotic Therapies: NA    LORD, JAMISON, PMH-NP 07/18/2015, 1:37 PM

## 2015-07-18 NOTE — ED Notes (Signed)
Pt. Is extremely irritable cursing staff as we walk by day room.  She continues to adamantly state she does not belong here.  She refuses to participate in her assessment, she refuses all meds.  15 minute checks and video monitoring continue.

## 2015-07-18 NOTE — BH Assessment (Signed)
Patient was reassessed by TTS.   Patient states "I do not need your services, you want to talk to me so bad and I don't know why." Patient states "I am not married just because I'm watching [employer]." Patient states "I do not need your services and I don't need to be here." Patient refused to answer remaining questions.  Patient stood up and her pulled her pants down and walked up to the television to change the channel.    Patient is psychiatrically cleared at this time and will be scheduled for discharge per Psychiatric Team.   Rosalin Hawking, LCSW Therapeutic Triage Specialist [place] 07/18/2015 1:45 PM

## 2015-07-18 NOTE — ED Notes (Signed)
Pt was discharged reluctantly .  She wouldn't accept discharge papers or bus pass.  She was verbally abusive and continued to say she shouldn't be here.  [employer] and security escorted pt. Out the door.  All belongings were returned although she would not sign for them.  Pt was ambulatory and in no distress at discharge.

## 2015-08-26 ENCOUNTER — Emergency Department (HOSPITAL_COMMUNITY)
Admission: EM | Admit: 2015-08-26 | Discharge: 2015-09-04 | Disposition: A | Discharge status: Other Acute Inpatient Hospital | Payer: BC Managed Care – PPO | Attending: Physician Assistant | Admitting: Physician Assistant

## 2015-08-26 ENCOUNTER — Encounter (HOSPITAL_COMMUNITY): Payer: Self-pay | Admitting: Emergency Medicine

## 2015-08-26 DIAGNOSIS — Z880 Allergy status to penicillin: Secondary | ICD-10-CM | POA: Insufficient documentation

## 2015-08-26 DIAGNOSIS — R45850 Homicidal ideations: Secondary | ICD-10-CM

## 2015-08-26 DIAGNOSIS — F312 Bipolar disorder, current episode manic severe with psychotic features: Secondary | ICD-10-CM | POA: Diagnosis present

## 2015-08-26 DIAGNOSIS — F911 Conduct disorder, childhood-onset type: Secondary | ICD-10-CM | POA: Insufficient documentation

## 2015-08-26 DIAGNOSIS — Z3202 Encounter for pregnancy test, result negative: Secondary | ICD-10-CM | POA: Insufficient documentation

## 2015-08-26 HISTORY — DX: Schizophrenia, unspecified: F20.9

## 2015-08-26 LAB — ACETAMINOPHEN LEVEL

## 2015-08-26 LAB — COMPREHENSIVE METABOLIC PANEL
ALBUMIN: 4.2 g/dL (ref 3.5–5.0)
ALK PHOS: 55 U/L (ref 38–126)
ALT: 7 U/L — AB (ref 14–54)
ANION GAP: 10 (ref 5–15)
AST: 15 U/L (ref 15–41)
BILIRUBIN TOTAL: 0.6 mg/dL (ref 0.3–1.2)
BUN: 10 mg/dL (ref 6–20)
CALCIUM: 9.4 mg/dL (ref 8.9–10.3)
CO2: 23 mmol/L (ref 22–32)
CREATININE: 0.85 mg/dL (ref 0.44–1.00)
Chloride: 107 mmol/L (ref 101–111)
Glucose, Bld: 109 mg/dL — ABNORMAL HIGH (ref 65–99)
Potassium: 3.7 mmol/L (ref 3.5–5.1)
SODIUM: 140 mmol/L (ref 135–145)
TOTAL PROTEIN: 7.5 g/dL (ref 6.5–8.1)

## 2015-08-26 LAB — CBC
HCT: 28.6 % — ABNORMAL LOW (ref 36.0–46.0)
HEMOGLOBIN: 8.3 g/dL — AB (ref 12.0–15.0)
MCH: 21.5 pg — AB (ref 26.0–34.0)
MCHC: 29 g/dL — AB (ref 30.0–36.0)
MCV: 74.1 fL — ABNORMAL LOW (ref 78.0–100.0)
Platelets: 553 10*3/uL — ABNORMAL HIGH (ref 150–400)
RBC: 3.86 MIL/uL — ABNORMAL LOW (ref 3.87–5.11)
RDW: 17.5 % — AB (ref 11.5–15.5)
WBC: 7.4 10*3/uL (ref 4.0–10.5)

## 2015-08-26 LAB — SALICYLATE LEVEL

## 2015-08-26 LAB — ETHANOL: Alcohol, Ethyl (B): <5 mg/dL (ref <5)

## 2015-08-26 LAB — HCG, QUANTITATIVE, PREGNANCY

## 2015-08-26 MED ORDER — HALOPERIDOL LACTATE 5 MG/ML IJ SOLN: 2.0000 mg | Freq: Once | INTRAMUSCULAR | Status: AC

## 2015-08-26 MED ORDER — LORAZEPAM 1 MG PO TABS: 1.0000 mg | ORAL_TABLET | Freq: Once | ORAL | Status: DC

## 2015-08-26 MED ORDER — ONDANSETRON HCL 4 MG PO TABS: 4.0000 mg | ORAL_TABLET | Freq: Three times a day (TID) | ORAL | Status: DC | PRN

## 2015-08-26 MED ORDER — HALOPERIDOL LACTATE 5 MG/ML IJ SOLN
INTRAMUSCULAR | Status: AC
  Administered 2015-08-26: 2 mg
  Filled 2015-08-26: qty 1

## 2015-08-26 MED ORDER — IBUPROFEN 200 MG PO TABS: 600.0000 mg | ORAL_TABLET | Freq: Three times a day (TID) | ORAL | Status: DC | PRN

## 2015-08-26 MED ORDER — SODIUM CHLORIDE 0.9 % IV SOLN: Freq: Once | INTRAVENOUS | Status: DC

## 2015-08-26 MED ORDER — KETOROLAC TROMETHAMINE 30 MG/ML IJ SOLN: 30.0000 mg | Freq: Once | INTRAMUSCULAR | Status: DC

## 2015-08-26 MED ORDER — ACETAMINOPHEN 325 MG PO TABS: 650.0000 mg | ORAL_TABLET | ORAL | Status: DC | PRN

## 2015-08-26 MED ORDER — LORAZEPAM 2 MG/ML IJ SOLN
2.0000 mg | Freq: Once | INTRAMUSCULAR | Status: AC
  Administered 2015-08-26: 2 mg via INTRAMUSCULAR
  Filled 2015-08-26: qty 1

## 2015-08-26 MED ORDER — HALOPERIDOL LACTATE 5 MG/ML IJ SOLN
3.0000 mg | Freq: Once | INTRAMUSCULAR | Status: AC
  Administered 2015-08-26: 3 mg via INTRAMUSCULAR
  Filled 2015-08-26: qty 1

## 2015-08-26 MED ORDER — LORAZEPAM 1 MG PO TABS: 1.0000 mg | ORAL_TABLET | Freq: Three times a day (TID) | ORAL | Status: DC | PRN

## 2015-08-26 NOTE — ED Notes (Signed)
Pt brought in by [place] office under IVC  Pt is belligerent in triage  Pt is in handcuffs and cursing at officer   Pt is not answering questions at this time and is refusing to cooperate  Paperwork states that the pt is schizophrenic, bipolar, paranoid, and is prescribed medication for condition  Pt has been previously committed at [facility]  Petitioner states pt has been hallucinating and saying off the wall stuff and thinks people are watching her and taking her things  Respondent is also making threats to murder the petitioner's father and threatening to kill family members and making verbal threats to kill and "get back at others"  There is also a 59 year old daughter in the home that petitioner is concerned for

## 2015-08-26 NOTE — ED Notes (Signed)
Pt offered copy of IVC papers, pt declined. Pt continues to be belligerent with officers and staff

## 2015-08-26 NOTE — BH Assessment (Addendum)
Assessment Note  Valerie Howard is an 39 y.o. female who presents to [facility] with [facility] under [employer]. IVC states:  "Danger to self and others. Respondent is diagnosed schizophrenic/bipolar/paranooid, and is prescribed medication (Olanzapine) for condition. Respondent [facility]. According to petitioner, respondent has been hallucinating. Respondent has been saying off the wall things and thinking people are watching her and taking her things. Respondent is also making threats to murder the petitioner's father. Respondent has been threatening to kill family members and making verbal threats to kill and "get back at others." There is also a 47 year old child in the home that petitioner is concerned for."  Patient refused to participate in the assessment. Patient was in the bed dressed in scrubs in four point restraints and facing the wall. Patient states repeatedly "I don't need to be here, I don't need your help." When asked if she was suicidal she states "was I suicidal last time I was here? Have I ever been suicidal? No, stop asking me that stupid question." When asked if she was homicidal she states "are you homicidal?, okay then." Patient refused to answer any other questions and repeated that she does not need to be here because nothing is wrong with her. Then patient would state 'I don't need your services." Patient released herself from one of the four point restraints and sheriff and nursing staff entered the room.  Patients daughter Grant Fontana who is the petitioner was contacted at [phone] for collateral information and there was no answer, a voicemail was left for her to return call.  Patients ex-husband who patient reportedly threatened was contacted and states that there are no gun in the home but the patient threatens him "from time to time."   Consulted with Glenda Chroman, Np who recommends inpatient treatment at this  time  TTS to seek placement.   Diagnosis: Schizophrenia  Past Medical History:  Past Medical History  Diagnosis Date  . Paranoid behavior ([place])   . Schizophrenia [facility])     Past Surgical History  Procedure Laterality Date  . Cesarean section      Family History:  Family History  Problem Relation Age of Onset  . Hypertension Other   . Diabetes Other   . Cancer Other     Social History:  reports that she has never smoked. She does not have any smokeless tobacco history on file. She reports that she does not drink alcohol or use illicit drugs.  Additional Social History:  Alcohol / Drug Use Pain Medications: See PTA Prescriptions: See PTA Over the Counter: See PTA  CIWA: CIWA-Ar BP: 107/70 mmHg Pulse Rate: 82 COWS:    Allergies:  Allergies  Allergen Reactions  . Penicillins Rash    Has patient had a PCN reaction causing immediate rash, facial/tongue/throat swelling, SOB or lightheadedness with hypotension:Patient refuses to answer (PRA) Has patient had a PCN reaction causing severe rash involving mucus membranes or skin necrosis:PRA Has patient had a PCN reaction that required hospitalization PRA Has patient had a PCN reaction occurring within the last 10 years:PRA If all of the above answers are "NO", then may proceed with Cephalosporin use.     Home Medications:  (Not in a hospital admission)  OB/GYN Status:  No LMP recorded.  General Assessment Data [facility] TTS Assessment: In system Is this a Tele or Face-to-Face Assessment?: Face-to-Face Is this an Initial Assessment or a Re-assessment for  this encounter?: Initial Assessment Marital status:  Pincus Badder) Maiden name:  (UTA) Is patient pregnant?: Unknown Pregnancy Status: Unknown Admission Status: Involuntary Is patient capable of signing voluntary admission?: No Referral Source: Other     Crisis Care Plan Name of Psychiatrist: [place] Name of Therapist: UTA  Education Status Is  patient currently in school?: No Highest grade of school patient has completed: UTA  Risk to self with the past 6 months Suicidal Ideation: No Has patient been a risk to self within the past 6 months prior to admission? : No Suicidal Intent: No Has patient had any suicidal intent within the past 6 months prior to admission? : No Is patient at risk for suicide?: No Suicidal Plan?: No Has patient had any suicidal plan within the past 6 months prior to admission? : No Access to Means: No What has been your use of drugs/alcohol within the last 12 months?: Denies Previous Attempts/Gestures: No How many times?: 0 Other Self Harm Risks: UTA Triggers for Past Attempts: None known Intentional Self Injurious Behavior:  (UTA) Family Suicide History: Unable to assess Recent stressful life event(s):  (UTA) Persecutory voices/beliefs?:  (UTA) Depression:  (UTA) Depression Symptoms:  (UTA) Substance abuse history and/or treatment for substance abuse?:  (UTA) Suicide prevention information given to non-admitted patients: Not applicable  Risk to Others within the past 6 months Homicidal Ideation:  (UTA) Does patient have any lifetime risk of violence toward others beyond the six months prior to admission? :  (UTA) Thoughts of Harm to Others:  (UTA) Current Homicidal Intent:  (UTA) Current Homicidal Plan:  (UTA) Access to Homicidal Means:  (UTA) Identified Victim:  (UTA) History of harm to others?:  (UTA) Assessment of Violence:  (UTA) Violent Behavior Description:  (UTA) Does patient have access to weapons?:  (UTA) Criminal Charges Pending?:  (UTA) Does patient have a court date:  (UTA) Is patient on probation?:  (UTA)  Psychosis Hallucinations:  (UTA) Delusions:  (UTA)  Mental Status Report Appearance/Hygiene: Disheveled, In scrubs Eye Contact: Poor Motor Activity: Agitation Speech: Argumentative, Loud Level of Consciousness: Alert, Combative, Irritable Mood: Angry,  Irritable Affect: Angry, Irritable Anxiety Level: None Thought Processes: Circumstantial Judgement: Unable to Assess Orientation: Person, Place Obsessive Compulsive Thoughts/Behaviors: Unable to Assess  Cognitive Functioning Concentration: Unable to Assess Memory: Unable to Assess IQ: Average Insight: Unable to Assess Impulse Control: Unable to Assess Appetite:  (UTA) Sleep: Unable to Assess Vegetative Symptoms: Unable to Assess  ADLScreening [facility] Assessment Services) Patient's cognitive ability adequate to safely complete daily activities?: Yes Patient able to express need for assistance with ADLs?: Yes Independently performs ADLs?: Yes (appropriate for developmental age)  Prior Inpatient Therapy Prior Inpatient Therapy: Yes Prior Therapy Dates: 2016 Prior Therapy Facilty/Provider(s): [facility] Reason for Treatment: Schizophrenia  Prior Outpatient Therapy Prior Outpatient Therapy:  (UTA) Prior Therapy Dates:  (UTA) Prior Therapy Facilty/Provider(s):  (UTA) Reason for Treatment:  (UTA) Does patient have an ACCT team?: Unknown Does patient have Intensive In-House Services?  : Unknown Does patient have [facility] services? : Unknown Does patient have P4CC services?: Unknown  ADL Screening (condition at time of admission) Patient's cognitive ability adequate to safely complete daily activities?: Yes Is the patient deaf or have difficulty hearing?: No Does the patient have difficulty seeing, even when wearing glasses/contacts?: No Does the patient have difficulty concentrating, remembering, or making decisions?: No Patient able to express need for assistance with ADLs?: Yes Does the patient have difficulty dressing or bathing?: No Independently performs ADLs?: Yes (appropriate for developmental age)  Does the patient have difficulty walking or climbing stairs?: No Weakness of Legs: None Weakness of Arms/Hands: None  Home Assistive Devices/Equipment Home Assistive  Devices/Equipment: None  Therapy Consults (therapy consults require a physician order) PT Evaluation Needed: No OT Evalulation Needed: No SLP Evaluation Needed: No       Advance Directives (For Healthcare) Does patient have an advance directive?: No Would patient like information on creating an advanced directive?: No - patient declined information    Additional Information 1:1 In Past 12 Months?: No CIRT Risk: Yes Elopement Risk: Yes Does patient have medical clearance?: Yes     Disposition:  Disposition Initial Assessment Completed for this Encounter: Yes Disposition of Patient: Inpatient treatment program (per Glenda Chroman, NP) Type of inpatient treatment program: Adult  On Site Evaluation by:   Reviewed with Physician:    Meda Dudzinski 08/26/2015 11:25 PM

## 2015-08-26 NOTE — BH Assessment (Addendum)
Called patients daughter Valerie Howard at [phone] as listed on the IVC and there was no answer. No identifying information on voice recording. Requested that she return call to [facility] at [phone] at her earliest convenience.   Call was an attempt to collect collateral information on patients IVC.   Contacted patients daughter Valerie Howard at other number listed on IVC as [phone] who is petitioners father. He was informed that no information could be released but he could provide collateral information.  He states that "they got tired of them saying she's gonna shoot their dad and this is what the little ones are hearing. And she's doing things to aggravate them and they don't want to be around her." He states that by aggravating he means "when they go to school she goes in their rooms and take things and hide them or cover things up because it's a certain color." He states that he is the person that the patient is threatening and is the patients ex-husband, Valerie Howard. He states that he cares for the patient and "she is not eating on a regular basis or none of that and we've dealt with that several times." he states that "we would like her to be committed to [facility]" He states that she thinks that her "family is out to get her and she will write on a piece of paper, I'm Valerie Howard, I'm divorced, "F" Valerie Howard." He states that "today she was telling the kids she was going to shoot me and they were worried." He states that the patient does not have access to any guns in the house "there ain't no guns around here or nothing like that, these are just things that she continuously talks about." Patients ex-husband states that he would like for the patient to be medicated reporting that she was here last month and "absolutely nothing was done."      Valerie Hawking, LCSW Therapeutic Triage Specialist [place] 08/26/2015 11:11 PM

## 2015-08-26 NOTE — ED Notes (Signed)
Pt chewed through pulse ox replaced and put it on her forehead

## 2015-08-26 NOTE — ED Notes (Signed)
Pt continued to be restrained to bed x 4, pt continues to be belligerent with staff. Pt reminded of the conditions for release.

## 2015-08-26 NOTE — ED Notes (Signed)
Pt continues to pull off pulse ox

## 2015-08-26 NOTE — ED Notes (Signed)
Pt refused VS  

## 2015-08-26 NOTE — ED Notes (Signed)
Pt placed in bilateral wrist and ankle restraints due to continued violent, aggressive, and threatening behavior.  Multiple security staff, [employer] officers, and ED personnel at bedside to restrain patient and obtain vital signs as patient is highly uncooperative.  She repeatedly verbalizes "I'm gonna get you" to all members present in the room.  No acute distress noted as a result of medication administered or placement of violent restraints per policy.

## 2015-08-26 NOTE — ED Notes (Signed)
UNABLE TO COLLECT LABS AT THIS TIME BECAUSE OF PATIENT BEHAVIOR.

## 2015-08-26 NOTE — ED Provider Notes (Signed)
CSN: [id number]     Arrival date & time 08/26/15  1926 History   First MD Initiated Contact with Patient 08/26/15 2057     Chief Complaint  Patient presents with  . Homicidal     (Consider location/radiation/quality/duration/timing/severity/associated sxs/prior Treatment) HPI   Patient is a 39 year old female with history of paranoid behavior and sits for any of presenting today with IVC papers. Patient is out of control, pain staff and police officers to light. Patient stating " i don't have to be here".  Patient displaying paranoid behavior.  L5 caveat psychiatric disorder.  Past Medical History  Diagnosis Date  . Paranoid behavior ([place])   . Schizophrenia [facility])    Past Surgical History  Procedure Laterality Date  . Cesarean section     Family History  Problem Relation Age of Onset  . Hypertension Other   . Diabetes Other   . Cancer Other    Social History  Substance Use Topics  . Smoking status: Never Smoker   . Smokeless tobacco: None  . Alcohol Use: No   OB History    No data available     Review of Systems  Unable to perform ROS: Psychiatric disorder      Allergies  Penicillins  Home Medications   Prior to Admission medications   Not on File   BP 113/71 mmHg  Pulse 73  Resp 14  SpO2 99% Physical Exam  Constitutional: She appears well-developed and well-nourished.  HENT:  Head: Normocephalic and atraumatic.  Eyes: Conjunctivae are normal. Right eye exhibits no discharge.  Neck: Neck supple.  Cardiovascular: Normal rate, regular rhythm and normal heart sounds.   No murmur heard. Pulmonary/Chest: Effort normal.  Musculoskeletal: Normal range of motion. She exhibits no edema.  Neurological: No cranial nerve deficit.  Skin: Skin is warm and dry. No rash noted. She is not diaphoretic.  Psychiatric:  Aggressive angry behavior.  Nursing note and vitals reviewed.   ED Course  Procedures (including critical care time) Labs Review Labs  Reviewed  COMPREHENSIVE METABOLIC PANEL - Abnormal; Notable for the following:    Glucose, Bld 109 (*)    ALT 7 (*)    All other components within normal limits  ACETAMINOPHEN LEVEL - Abnormal; Notable for the following:    Acetaminophen (Tylenol), Serum <10 (*)    All other components within normal limits  CBC - Abnormal; Notable for the following:    RBC 3.86 (*)    Hemoglobin 8.3 (*)    HCT 28.6 (*)    MCV 74.1 (*)    MCH 21.5 (*)    MCHC 29.0 (*)    RDW 17.5 (*)    Platelets 553 (*)    All other components within normal limits  ETHANOL  SALICYLATE LEVEL  HCG, QUANTITATIVE, PREGNANCY  URINE RAPID DRUG SCREEN, HOSP PERFORMED    Imaging Review No results found. I have personally reviewed and evaluated these images and lab results as part of my medical decision-making.   EKG Interpretation None      MDM   Final diagnoses:  None    Patient is a 39 year old female presenting with aggressive behavior. Patient has already had IVC paperwork completed. Patient behavior consistent with homicidal here. Agree with IVC paperwork.  Using chemical and physical restraints at this time.  IVC paperwork done. I did "first look" paperwork for tomorrow as well.     Alizaya Oshea Julio Alm, MD 08/26/15 [id number]

## 2015-08-26 NOTE — BH Assessment (Signed)
Assessment completed. Consulted with Glenda Chroman, NP who recommends inpatient at this time. [facility] does not have appropriate beds at this time. TTS to seek placement.   Rosalin Hawking, LCSW Therapeutic Triage Specialist [place] 08/26/2015 10:33 PM

## 2015-08-26 NOTE — ED Notes (Signed)
Pt requested to go to bathroom; ambulated with security escort to restroom and then refused to provide sample.  Replaced restraints per orders.  RN explained to pt that a urine sample is required and that if she is unable to provide one then we will perform in and out cath.  She states that she may be able to urinate in an hour. No acute distress noted.  Pt is being more cooperative than she was on arrival but still does not meet criteria for discontinuation of restraints per policy.

## 2015-08-27 DIAGNOSIS — F312 Bipolar disorder, current episode manic severe with psychotic features: Secondary | ICD-10-CM

## 2015-08-27 MED ORDER — OXCARBAZEPINE 300 MG PO TABS
300.0000 mg | ORAL_TABLET | Freq: Two times a day (BID) | ORAL | Status: DC
  Filled 2015-08-27 (×18): qty 1

## 2015-08-27 MED ORDER — TRAZODONE HCL 50 MG PO TABS: 50.0000 mg | ORAL_TABLET | Freq: Every evening | ORAL | Status: DC | PRN

## 2015-08-27 NOTE — ED Notes (Signed)
On the phone 

## 2015-08-27 NOTE — ED Notes (Signed)
Removed right wrist restraint per MD recommendation.  Pt is now only restrained on left wrist.  She is still sleeping with safety sitter present at the bedside.  Normal vital signs.  No acute distress noted.

## 2015-08-27 NOTE — ED Notes (Signed)
Removed left ankle restraint per MD's recommendation to remove one at a time.  Pt is now only restrained at bilateral wrists.  No acute distress noted.  Safety sitter is present at the bedside and is aware that pt is only partially restrained at this time.

## 2015-08-27 NOTE — Progress Notes (Addendum)
Patient is currently resting in bed with eyes closed. At 2104 this writer went to talk with patient and patient refused to talk with Probation officer. Patient refused HS medication.

## 2015-08-27 NOTE — ED Notes (Signed)
Discontinued restraints due to patient's continued cooperation.  Safety sitter present at the bedside.

## 2015-08-27 NOTE — BH Assessment (Signed)
[place] Assessment Progress Note  The following facilities have been contacted to seek placement for this pt, with results as noted:  Beds available, information sent, decision pending:  [facility]   No beds available, but accepting referrals for future consideration; information sent:  [facility]   At capacity:  [facility] [place], [place] Triage Specialist [phone]

## 2015-08-27 NOTE — ED Notes (Signed)
Pt belongings include a shirt, pair of leggings, and shoes.  No other belongings noted. Given to [facility] staff on transfer.

## 2015-08-27 NOTE — ED Notes (Signed)
Removed right ankle restraint per MD recommendation; she wants [place] to remove hard restraints one at a time to see how patient cooperates.  RN explained to patient that this is the plan and she did not respond.  No signs of acute distress noted.  Safety sitter present at bedside.

## 2015-08-27 NOTE — ED Notes (Signed)
Up tot he bathroom to shower and change scrubs 

## 2015-08-27 NOTE — Consult Note (Signed)
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 Suicidal ideation [R45.851]   . Personality disorder [F60.9] 10/17/2012  . Anemia, iron deficiency [D50.9] 10/13/2012  . Tachycardia [R00.0] 10/01/2012  . Schizophrenia, paranoid type ([place]) [F20.0] 12/07/2011    Class: Acute    Total Time spent with patient: 30 minutes  Subjective:   Valerie Howard is a 39 y.o. female patient admitted with  Agitation, Aggression  HPI:  Attempted to evaluate Valerie Howard female, 39 years old  But could not as she refused to speak with providers.  She was angry and irritable which is the same presentation the last time she was here.  Patient did not answer any question asked of her and she ushered providers out of her room.  She was brought in to [facility] in 4 points restraint on arrival yesterday due to violence towards security officers and staff.  Patient was threatening staff members and her family.  She stated today that her daughter brought her in and she will deal with her.  She has been accepted for admission and we will be seeking placement at any hospital with availble bed.  Past Psychiatric History: Paranoid Schizophrenia, Personality disorder  Risk to Self: Suicidal Ideation: No Suicidal Intent: No Is patient at risk for suicide?: No Suicidal Plan?: No Access to Means: No What has been your use of drugs/alcohol within the last 12 months?: Denies How many times?: 0 Other Self Harm Risks: UTA Triggers for Past Attempts: None known Intentional Self Injurious Behavior:  (UTA) Risk to Others: Homicidal Ideation:  (UTA) Thoughts of Harm to  Others:  (UTA) Current Homicidal Intent:  (UTA) Current Homicidal Plan:  (UTA) Access to Homicidal Means:  (UTA) Identified Victim:  (UTA) History of harm to others?:  (UTA) Assessment of Violence:  (UTA) Violent Behavior Description:  (UTA) Does patient have access to weapons?:  (UTA) Criminal Charges Pending?:  (UTA) Does patient have a court date:  (UTA) Prior Inpatient Therapy: Prior Inpatient Therapy: Yes Prior Therapy Dates: 2016 Prior Therapy Facilty/Provider(s): [facility] Reason for Treatment: Schizophrenia Prior Outpatient Therapy: Prior Outpatient Therapy:  (UTA) Prior Therapy Dates:  (UTA) Prior Therapy Facilty/Provider(s):  (UTA) Reason for Treatment:  (UTA) Does patient have an ACCT team?: Unknown Does patient have Intensive In-House Services?  : Unknown Does patient have [facility] services? : Unknown Does patient have P4CC services?: Unknown  Past Medical History:  Past Medical History  Diagnosis Date  . Paranoid behavior ([place])   . Schizophrenia [facility])     Past Surgical History  Procedure Laterality Date  . Cesarean section     Family History:  Family History  Problem Relation Age of Onset  . Hypertension Other   . Diabetes Other   . Cancer Other    Family Psychiatric  History:  UNABLE TO OBTAIN Social History:  History  Alcohol Use No     History  Drug Use No    Social History   Social History  . Marital Status: Legally Separated    Spouse Name: N/A  . Number of Children: N/A  . Years of Education: N/A   Social History Main Topics  . Smoking status: Never  Smoker   . Smokeless tobacco: None  . Alcohol Use: No  . Drug Use: No  . Sexual Activity: No   Other Topics Concern  . None   Social History Narrative   Additional Social History:    Pain Medications: See PTA Prescriptions: See PTA Over the Counter: See PTA       Allergies:   Allergies  Allergen Reactions  . Penicillins Rash    Has patient had a PCN reaction causing immediate  rash, facial/tongue/throat swelling, SOB or lightheadedness with hypotension:Patient refuses to answer (PRA) Has patient had a PCN reaction causing severe rash involving mucus membranes or skin necrosis:PRA Has patient had a PCN reaction that required hospitalization PRA Has patient had a PCN reaction occurring within the last 10 years:PRA If all of the above answers are "NO", then may proceed with Cephalosporin use.     Labs:  Results for orders placed or performed during the hospital encounter of 08/26/15 (from the past 48 hour(s))  Comprehensive metabolic panel     Status: Abnormal   Collection Time: 08/26/15  9:47 PM  Result Value Ref Range   Sodium 140 135 - 145 mmol/L   Potassium 3.7 3.5 - 5.1 mmol/L   Chloride 107 101 - 111 mmol/L   CO2 23 22 - 32 mmol/L   Glucose, Bld 109 (H) 65 - 99 mg/dL   BUN 10 6 - 20 mg/dL   Creatinine, Ser 0.85 0.44 - 1.00 mg/dL   Calcium 9.4 8.9 - 10.3 mg/dL   Total Protein 7.5 6.5 - 8.1 g/dL   Albumin 4.2 3.5 - 5.0 g/dL   AST 15 15 - 41 U/L   ALT 7 (L) 14 - 54 U/L   Alkaline Phosphatase 55 38 - 126 U/L   Total Bilirubin 0.6 0.3 - 1.2 mg/dL   GFR calc non Af Amer >60 >60 mL/min   GFR calc Af Amer >60 >60 mL/min    Comment: (NOTE) The eGFR has been calculated using the CKD EPI equation. This calculation has not been validated in all clinical situations. eGFR's persistently <60 mL/min signify possible Chronic Kidney Disease.    Anion gap 10 5 - 15  Ethanol (ETOH)     Status: None   Collection Time: 08/26/15  9:47 PM  Result Value Ref Range   Alcohol, Ethyl (B) <5 <5 mg/dL    Comment:        LOWEST DETECTABLE LIMIT FOR SERUM ALCOHOL IS 5 mg/dL FOR MEDICAL PURPOSES ONLY   Salicylate level     Status: None   Collection Time: 08/26/15  9:47 PM  Result Value Ref Range   Salicylate Lvl <6.2 2.8 - 30.0 mg/dL  Acetaminophen level     Status: Abnormal   Collection Time: 08/26/15  9:47 PM  Result Value Ref Range   Acetaminophen (Tylenol), Serum  <10 (L) 10 - 30 ug/mL    Comment:        THERAPEUTIC CONCENTRATIONS VARY SIGNIFICANTLY. A RANGE OF 10-30 ug/mL MAY BE AN EFFECTIVE CONCENTRATION FOR MANY PATIENTS. HOWEVER, SOME ARE BEST TREATED AT CONCENTRATIONS OUTSIDE THIS RANGE. ACETAMINOPHEN CONCENTRATIONS >150 ug/mL AT 4 HOURS AFTER INGESTION AND >50 ug/mL AT 12 HOURS AFTER INGESTION ARE OFTEN ASSOCIATED WITH TOXIC REACTIONS.   CBC     Status: Abnormal   Collection Time: 08/26/15  9:47 PM  Result Value Ref Range   WBC 7.4 4.0 - 10.5 K/uL   RBC 3.86 (L) 3.87 - 5.11 MIL/uL   Hemoglobin 8.3 (L) 12.0 -  15.0 g/dL   HCT 28.6 (L) 36.0 - 46.0 %   MCV 74.1 (L) 78.0 - 100.0 fL   MCH 21.5 (L) 26.0 - 34.0 pg   MCHC 29.0 (L) 30.0 - 36.0 g/dL   RDW 17.5 (H) 11.5 - 15.5 %   Platelets 553 (H) 150 - 400 K/uL  hCG, quantitative, pregnancy     Status: None   Collection Time: 08/26/15  9:47 PM  Result Value Ref Range   hCG, Beta Chain, Quant, S <1 <5 mIU/mL    Comment:          GEST. AGE      CONC.  (mIU/mL)   <=1 WEEK        5 - 50     2 WEEKS       50 - 500     3 WEEKS       100 - 10,000     4 WEEKS     1,000 - 30,000     5 WEEKS     3,500 - 115,000   6-8 WEEKS     12,000 - 270,000    12 WEEKS     15,000 - 220,000        FEMALE AND NON-PREGNANT FEMALE:     LESS THAN 5 mIU/mL     Current Facility-Administered Medications  Medication Dose Route Frequency Provider Last Rate Last Dose  . acetaminophen (TYLENOL) tablet 650 mg  650 mg Oral Q4H PRN Courteney Lyn Mackuen, MD      . ibuprofen (ADVIL,MOTRIN) tablet 600 mg  600 mg Oral Q8H PRN Courteney Lyn Mackuen, MD      . LORazepam (ATIVAN) tablet 1 mg  1 mg Oral Q8H PRN Courteney Lyn Mackuen, MD      . ondansetron (ZOFRAN) tablet 4 mg  4 mg Oral Q8H PRN Courteney Lyn Mackuen, MD      . Oxcarbazepine (TRILEPTAL) tablet 300 mg  300 mg Oral BID Kainalu Heggs   300 mg at 08/27/15 1252  . traZODone (DESYREL) tablet 50 mg  50 mg Oral QHS PRN Mel Tadros       No current outpatient  prescriptions on file.    Musculoskeletal: Strength & Muscle Tone: within normal limits Gait & Station: normal Patient leans: N/A  Psychiatric Specialty Exam: Review of Systems  Unable to perform ROS: medical condition    Blood pressure 118/74, pulse 82, resp. rate 14, SpO2 99 %.There is no weight on file to calculate BMI.  General Appearance: Casual  Eye Contact::  Minimal  Speech:  Clear and Coherent and loud  Volume:  Increased  Mood:  Angry and Irritable  Affect:  Congruent and Labile  Thought Process:  Linear  Orientation:  Other:  unable to obtain  Thought Content:  unable to obtain  Suicidal Thoughts:  unable to obtain, patient is very angry and asked providers to leave her room.  Homicidal Thoughts:  unable to obtain  Memory:  refuses to answer questions.  Judgement:  Poor  Insight:  Lacking  Psychomotor Activity:  Normal  Concentration:  Poor  Recall:  unable to obtain  Fund of Knowledge:Poor  Language: Poor  Akathisia:  No  Handed:  Right  AIMS (if indicated):     Assets:  Others:  unable to obtain  ADL's:  Intact  Cognition: Impaired,  Severe  Sleep:      Treatment Plan Summary: Daily contact with patient to assess and evaluate symptoms and progress in treatment and Medication  management  Disposition:   Accepted for admission, we will be seeking placement at any facility with available bed.  We have resumed home medications.  Delfin Gant    PMHNP-BC 08/27/2015 4:25 PM Patient seen face-to-face for psychiatric evaluation, chart reviewed and case discussed with the physician extender and developed treatment plan. Reviewed the information documented and agree with the treatment plan. Corena Pilgrim, MD

## 2015-08-27 NOTE — ED Notes (Signed)
Resting quielty in day room

## 2015-08-27 NOTE — ED Notes (Signed)
Pt ambulatory to SAPPU, gait steady, very defensive, not  forthcoming with information, sleeping at present.  Awake, alert & responsive, no distress noted, calm at present.  Pt is IVC. Papers report pt is HI towards daughters father.  Monitoring for safety, Q 15 min checks in effect.  Pt medicated with Ativan and Haldol in main ed, will continue to monitor.

## 2015-08-28 MED ORDER — FERROUS SULFATE 325 (65 FE) MG PO TABS
325.0000 mg | ORAL_TABLET | Freq: Every day | ORAL | Status: DC
  Filled 2015-08-28 (×9): qty 1

## 2015-08-28 NOTE — ED Provider Notes (Signed)
10:09 AM Labs reviewed. Anemic. Hemoglobin from 9.7 last month to 8.3 two days ago. Per review of records, this is likely chronic with hemoglobin in 8's dating back several years.. Arrived tachycardic, but also agitated at that time requiring sedation. Since then she has remained with a heart rate range of 70-80 in the past 2 days. She is normotensive. This is likely not contributing to current presentation or an acute/emergent process. Microcytic. May benefit from iron supplementation. Does not require further evaluation or intervention unless she would develop new symptoms.   8.3 (L) 9.7 (L) 8.5 (L) 8.5 (L)         Virgel Manifold, MD 08/28/15 1014

## 2015-08-28 NOTE — ED Notes (Signed)
Pt. Noted sleeping in room. No complaints or concerns voiced. No distress or abnormal behavior noted. Will continue to monitor with security cameras. Q 15 minute rounds continue. 

## 2015-08-28 NOTE — Progress Notes (Signed)
TTS informed CSW patient wanted to speak with social work. CSW went to speak with patient upon patient's request, along with TTS.   Patient would not answer questions when being asked by TTS. TTS informed patient that CSW accompanied to speak with her regarding the note she provided to TTS. Patient stated "yall ain't gone do nothing about it I'm going back to sleep". Patient refused to engage in conversation.  Genice Rouge [id number] ED CSW 08/28/2015 3:22 PM

## 2015-08-28 NOTE — BH Assessment (Signed)
[facility] Assessment Progress Note  The following facilities have been contacted to seek placement for this pt, with results as noted:  Beds available, information sent, decision pending:  [facility] [place]   Declined:  [facility] (pt acuity) [facility] (pt acuity) [facility] (pt acuity) [facility] (pt acuity)   At capacity:    [name], [place] Triage Specialist [phone]

## 2015-08-28 NOTE — ED Notes (Signed)
Pt. Noted in room. No complaints or concerns voiced. No distress or abnormal behavior noted. Will continue to monitor with security cameras. Q 15 minute rounds continue. 

## 2015-08-28 NOTE — BH Assessment (Signed)
Patient was reassessed by TTS.   Patient was in the day room laying on the couch. When asked about SI/HI patient states "I'm not ever here for that, I wasn't last time, was I?" When asked about AVH patient states I'm sleep." Patient then states "I'm watching the news because it's on." Patient was introduced to the CSW to address any concerns or complaints. Patient states "you figure it out" when asked if she needed to speak with CSW regarding anything.   Rosalin Hawking, LCSW Therapeutic Triage Specialist [place] 08/28/2015 12:30 PM

## 2015-08-28 NOTE — ED Notes (Signed)
Patient refused vitals.  Nurse notified.

## 2015-08-28 NOTE — BH Assessment (Addendum)
[facility] Assessment Progress Note  Per Corena Pilgrim, MD, this pt continues to require psychiatric hospitalization at this time and referral to [facility] should be pursued.  At 11:19 Raquel Sarna at the [facility] authorized referral, authorization [id number].  Please note that authorization does not mean that pt has been accepted to the facility.  At 11:28 I spoke to Robinette at [facility], who took demographic information by telephone.  Referral was then faxed.  At 11:54 I spoke to [facility] at [facility] who confirmed receipt of referral.  As of this writing a decision is pending.  [name], MA Triage Specialist [phone]    Addendum:  At 13:37 Marlowe Kays calls from [facility].  Pt has been accepted to their wait list.  [name], MA Triage Specialist [phone]

## 2015-08-28 NOTE — ED Notes (Signed)
Pt is refusing all meds and all procedures.  15 minute checks and video monitoring in place.

## 2015-08-28 NOTE — BHH Counselor (Signed)
Dr. Darleene Cleaver and Waylan Boga concerned about patient hemoglobin, spoke with EDP to request to review labs for drop in hemoglobin. EDP states that the patient appears to have chronic problems and will order a re-test. Informed Psychiatrist.  Rosalin Hawking, LCSW Therapeutic Triage Specialist [place] 08/28/2015 11:02 AM

## 2015-08-28 NOTE — ED Notes (Signed)
Report received from [facility]. Pt. Alert and oriented in no acute distress.  Pt. Refuses to answer questions and states "I don't need your services". Pt. Instructed to come to me with problems or concerns.Will continue to monitor for safety via security cameras and Q 15 minute checks.

## 2015-08-28 NOTE — Progress Notes (Signed)
Per TTS, patient would like to speak with social work. CSW attempted to speak with patient. Patient was out of the room at the time 8:20a.  Genice Rouge [id number] ED CSW 08/28/2015 8:32 AM

## 2015-08-28 NOTE — ED Notes (Signed)
Pt refused vital signs stating "I do not need your services. GOODBYE!"  RN notified.

## 2015-08-29 NOTE — ED Notes (Signed)
Pt. Noted sleeping in room. No complaints or concerns voiced. No distress or abnormal behavior noted. Will continue to monitor with security cameras. Q 15 minute rounds continue. 

## 2015-08-29 NOTE — ED Notes (Signed)
Pt. Noted in hall. No complaints or concerns voiced. No distress or abnormal behavior noted. Will continue to monitor with security cameras. Q 15 minute rounds continue. 

## 2015-08-29 NOTE — ED Notes (Signed)
In room eating

## 2015-08-29 NOTE — ED Notes (Signed)
Report received from Janie Rambo RN. Pt. Sleeping, respirations regular and unlabored. Will continue to monitor for safety via security cameras and Q 15 minute checks. 

## 2015-08-29 NOTE — ED Notes (Signed)
Up to the phone/bathroom

## 2015-08-29 NOTE — Consult Note (Signed)
Psychiatric Specialty Exam: Physical Exam  ROS  Blood pressure 118/74, pulse 82, resp. rate 14, SpO2 99 %.There is no weight on file to calculate BMI.  General Appearance: Casual  Eye Contact:: Minimal  Speech: Clear and Coherent and loud  Volume: Increased  Mood: Angry and Irritable  Affect: Congruent and Labile  Thought Process: Linear  Orientation: Other: unable to obtain  Thought Content: unable to obtain  Suicidal Thoughts: unable to obtain, patient is very angry and asked providers to leave her room.  Homicidal Thoughts: unable to obtain  Memory: refuses to answer questions.  Judgement: Poor  Insight: Lacking  Psychomotor Activity: Normal  Concentration: Poor  Recall: unable to obtain  Fund of Knowledge:Poor  Language: Poor  Akathisia: No  Handed: Right  AIMS (if indicated):    Assets: Others: unable to obtain  ADL's: Intact  Cognition: Impaired, Severe         Patient was seen in her room awake but refused to participate in the interview as she has been doing.  She stated" I am not sick, I do not need you all.  I need job, housing and a car.  I am divorced, I do not need to tell you more but please leave"  Patient turned her back to providers.  She is not taking her medications and stated she did not need them.  She constantly uses the phone and then walks to the dayroom where she spends a lot of her time. 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  Charmaine Downs   PMHNP-BC Agree with NP Note and Assessment as above

## 2015-08-29 NOTE — ED Notes (Signed)
Pt refused vital signs stating "Have I let you take them since I got here? No. Then why would I now? Go away!" RN Notified.

## 2015-08-30 NOTE — BH Assessment (Signed)
Patient was reassessed by TTS.   Patient was in her room and laying in the bed watching television. Patient stared at this writer without answering questions regarding how she is feeling today and SI/HI/AVH. Patient just stared at this time.   Psychiatry continues to recommend inpatient treatment at this time.   Rosalin Hawking, LCSW Therapeutic Triage Specialist [place] 08/30/2015 10:30 AM

## 2015-08-30 NOTE — ED Notes (Signed)
Pt. Noted in room. No complaints or concerns voiced. No acute distress noted. Will continue to monitor with security cameras. Q 15 minute rounds continue. 

## 2015-08-30 NOTE — ED Notes (Signed)
Pt. Noted sleeping in room. No complaints or concerns voiced. No distress or abnormal behavior noted. Will continue to monitor with security cameras. Q 15 minute rounds continue. 

## 2015-08-30 NOTE — ED Notes (Signed)
Pt up to the desk, sts" I don't need medicine.Marland KitchenMarland KitchenI'm divorced...you can tell them to kiss my as..."  Pt back to her room.

## 2015-08-30 NOTE — ED Notes (Signed)
Pt up to the desk requesting a snack, when informed that it was not snack time yet pt responded "when they bring my lunch stop playing with the food...have sense enough not to play with the food..." and went back to her room.

## 2015-08-30 NOTE — ED Notes (Signed)
Pt. Noted in room. No complaints or concerns voiced. No distress or abnormal behavior noted. Will continue to monitor with security cameras. Q 15 minute rounds continue. 

## 2015-08-30 NOTE — ED Notes (Signed)
TTS into see 

## 2015-08-30 NOTE — ED Notes (Signed)
Report received from [facility]. Pt. Refuses to answer questions.  Pt. Instructed to come to me with problems or concerns.Will continue to monitor for safety via security cameras and Q 15 minute checks.

## 2015-08-30 NOTE — ED Notes (Signed)
Patient declined therapeutic conversation with this Probation officer.

## 2015-08-30 NOTE — ED Notes (Signed)
Pt. Noted in room. No complaints or concerns voiced. No acute distress noted. Pt. Still uncooperative and argumentative. Will continue to monitor with security cameras. Q 15 minute rounds continue.

## 2015-08-30 NOTE — ED Notes (Signed)
Pt. To shower

## 2015-08-30 NOTE — ED Notes (Signed)
Pt. Noted in day room. Pt. Still uncooperative. No acute distress noted. Will continue to monitor with security cameras. Q 15 minute rounds continue.

## 2015-08-30 NOTE — ED Notes (Signed)
Pt. Noted in room. Remains uncooperative.. No acute distress noted. Will continue to monitor with security cameras. Q 15 minute rounds continue.

## 2015-08-31 NOTE — ED Notes (Signed)
Pt. Noted sleeping in room. No complaints or concerns voiced. No distress or abnormal behavior noted. Will continue to monitor with security cameras. Q 15 minute rounds continue. 

## 2015-08-31 NOTE — BH Assessment (Addendum)
[place] Assessment Progress Note  At 10:00 Rashawn at [facility] reports that pt remains on wait list   [name], [place]  Triage Specialist  [phone]

## 2015-08-31 NOTE — ED Notes (Signed)
Pt continues to refuse all meds and procedures.  Pt. Is isolating in room or day room.  15 minute checks and video monitoring continue.

## 2015-08-31 NOTE — BH Assessment (Signed)
Patient was reassessed by TTS.   Upon the first attempt patient was at the nurses station requesting phone numbers for face book and Flickr. Patient states "I cannot talk to you because I'm talking to her right now, I have nothing to say to you." Patient was informed that she would be reassessed later.   When asked if she was suicidal, patient states "are you suicidal?" When asked about HI patient states "are you homicidal? Okay then," When asked about hearing or seeing things other people don't see or hear, patient states "I'm trying not to hear you." Patient was laying on the couch and watching television in the day room.   Patient continues to meet inpatient criteria per Dr. Darleene Cleaver and Waylan Boga, DNP.

## 2015-08-31 NOTE — ED Notes (Signed)
Pt AAO x 3, no distress noted, calm, guarded, paranoid, sitting in dayroom at present.  Monitoring for safety, Q 15 min checks in effect.

## 2015-08-31 NOTE — ED Notes (Signed)
Patient refused vitals.

## 2015-09-01 NOTE — ED Notes (Signed)
D: Pt in bed, did not eat breakfast tray that was left where she told staff to put it. She refused her medications and repeated twice, "I don't need anything" and seemed to be angry and irritated.   A: Continue to monitor closely.   R: Pt not responding to treatment attempts.

## 2015-09-01 NOTE — BH Assessment (Signed)
Reassessment:   Writer met with patient face to face for a re-assessment. Writer introduced self and patient sts, "What do you want". Writer further attempted to ask patient how she was feeling today. Patient sts, "What do you want". Writer encouraged patient to participate in the assessment. Patient stated, "The only thing you can do for me is discharge me". She refused to cooperate starring at the television. Writer ended the conversation and left the room. Patient was heard stating, "The only thing you can do for me is get the Mother F@$%ing remote".   Writer unable to verify if patient is currently suicidal, homicidal, and/or experiencing AVH's. Patient refused to cooperate with answering questions.   Per Dr. Darleene Cleaver and Reginold Agent, NP patient to remain in the ED for placement. [facility] confirms that pt remains on wait list; auth #[id number].
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
## 2015-09-01 NOTE — BH Assessment (Signed)
[place] Assessment Progress Note  Pt's daughter, Grant Fontana, calls on behalf of pt's ex-husband, Tilda Burrow.  She reports that he will visit pt tomorrow morning, 09/02/2015.  Charmaine Downs, NP has been notified and concurs with this.  Pt's nurse has also been notified.  [name], [place] Triage Specialist [phone]

## 2015-09-01 NOTE — Progress Notes (Addendum)
Pt discussed in [facility] progression : interested in 1) home 2)car 3) job Cm asked to speak with pt about housing options in [place] to see if this will engage pt to participate and to encourage her to take medications  CM met with pt in her room Cm introduced herself and informed her MD stated she was interested in information about how to obtain housing.  Pt interrupted Cm to state she "I don't want to talk to you"  As Cm continued to talk to offer housing information, pt states, " I don't need your help. I don't want your low income housing.  Goodbye."  Denying need for assist with housing after frequently informing [facility] MD/PA/NP need for 1) home 2) car 3) job Refer to [facility] MD/PA/NP notes [facility] MD/PA/NP updated on pt responses and behavior  CM entered [place] housing authority information in pt d/c instructions   Follow-up With Details Why Contact Info  [place] housing  Call  Housing Authorities: [place]: [phone]     

## 2015-09-01 NOTE — ED Notes (Signed)
Pt up on unit , labile and irritable. Pt stated she does not know why she is here and she does not know why she has been misdiagnosed. Pt resistant to care , but is redirectable at this time

## 2015-09-01 NOTE — BH Assessment (Signed)
[place] Assessment Progress Note  At 08:37 [place] at [facility] reports that pt remains on their wait list.  [name], [place] Triage Specialist [phone]

## 2015-09-02 NOTE — ED Notes (Signed)
Patient refused vitals. Patient nurse notified.

## 2015-09-02 NOTE — ED Notes (Signed)
Pt awake, alert & responsive, no distress noted. Watching TV, remains Labile, monitoring for safety, Q 15 min checks in effect.

## 2015-09-02 NOTE — BH Assessment (Signed)
[place] Assessment Progress Note  This Probation officer spoke to [employer], pt's daughter, who reports that pt's ex-husband, Tilda Burrow, will be visiting today around 14:00.  Corena Pilgrim, MD, has renewed IVC.  Petition has been faxed to [facility], and at [employer] confirmed receipt.  At 12:32 Maudie Mercury at [facility] confirms that pt remains on their wait list.  [name], [place] Triage Specialist [phone]

## 2015-09-02 NOTE — Consult Note (Signed)
  Psychiatric Specialty Exam: Physical Exam  ROS  Blood pressure 118/74, pulse 82, resp. rate 14, SpO2 99 %.There is no weight on file to calculate BMI.  General Appearance: Casual  Eye Contact:: Minimal  Speech: Clear and Coherent and loud  Volume: Increased  Mood: Angry and Irritable  Affect: Congruent and Labile  Thought Process: Linear  Orientation: Other: unable to obtain  Thought Content: unable to obtain  Suicidal Thoughts: unable to obtain, patient is very angry and asked providers to leave her room.  Homicidal Thoughts: unable to obtain  Memory: refuses to answer questions.  Judgement: Poor  Insight: Lacking  Psychomotor Activity: Normal  Concentration: Poor  Recall: unable to obtain  Fund of Knowledge:Poor  Language: Poor  Akathisia: No  Handed: Right  AIMS (if indicated):    Assets: Others: unable to obtain  ADL's: Intact     Refuses to speak to providers.  Patient walked out of her room as soon as providers walked in.  Patient is not talking to staff and is not taking medications.  Patient is writing a lot on papers in her room.  She had written that she need job, housing and car.  Patient constantly is angry and speaking loud when she is talking to her family members.  Patient remains on [facility] waiting list.  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  Offer medications but patient refused.  Charmaine Downs   PMHNP-BC Patient seen face-to-face for psychiatric evaluation, chart reviewed and case discussed with the physician extender and developed treatment plan. Reviewed the information documented and agree with the treatment plan. Corena Pilgrim, MD

## 2015-09-02 NOTE — ED Notes (Signed)
Pt is irritable and demanding in speech. Pt refuses to take medications and ignores staff when an interaction is initiated. Safety maintained on the unit.

## 2015-09-03 NOTE — BH Assessment (Signed)
Reassessment:   Writer met with patient face to face for a re-assessment. Writer attempted to ask patient how she was feeling today. Patient sts, "I don't want to talk to you. Writer encouraged patient to participate in the assessment. Patient stated, "I don't want to talk to you now or ever". She further refused to cooperate keeping her back turned and covers over head. Writer ended the conversation and left the room. Patient was heard stating, "Yall F%&cking B&$ches".   Writer unable to verify if patient is currently suicidal, homicidal, and/or experiencing AVH's. Patient refused to cooperate with answering questions.   Per Dr. Darleene Cleaver and Reginold Agent, NP patient to remain in the ED for placement. [facility] confirms that pt remains on wait list; auth #[id number].

## 2015-09-03 NOTE — ED Notes (Signed)
Pt continues to refuse all procedures and meds.  She is irritable.  15 minute checks and video monitoring continue.

## 2015-09-03 NOTE — ED Notes (Signed)
Patient refused. Patient nurse notified.

## 2015-09-03 NOTE — ED Notes (Signed)
Pt resting on stretcher, no distress noted, calm, paranoid, very labile.  Monitoring for safety, Q 15 min checks in effect.

## 2015-09-03 NOTE — BH Assessment (Signed)
[place] Assessment Progress Note  At 08:38 [place] at [facility] reports that this pt remains on their wait list.  [name], MA Triage Specialist [phone]

## 2015-09-04 NOTE — [facility] Counselor (Signed)
Sheriff called and states that they will be here to transport this patient and "wanted her to be ready to go." Informed patients nurse of request.

## 2015-09-04 NOTE — ED Notes (Signed)
Pt has been uncooperative, refusing medications , appetite is poor. Mood is labile refusing vitals signs, demissive of staff. Transported via sherrif office to [employer]

## 2015-09-04 NOTE — BH Assessment (Signed)
Patient reassessed bt TTS.  Patient states "don't start with me today" when this writer approached the door. Patient refused to answer questions about SI/HI and just stared at this Probation officer. When asked about AVH patient states "yea I see you." Patient was in her room sitting in bed watching television.    Dr. Darleene Cleaver continues to recommend inpatient treatment at this time.   Rosalin Hawking, LCSW Therapeutic Triage Specialist [place] 09/04/2015 10:13 AM
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
## 2015-09-04 NOTE — BH Assessment (Signed)
[place] Assessment Progress Note  Per Corena Pilgrim, MD, this pt continues to require psychiatric hospitalization.  At 11:16 Marlowe Kays calls from [facility]; pt has been accepted to their facility.  Dr Darleene Cleaver concurs with this decision.  Pt's nurse has been notified.  Pt is under IVC and [facility].  [name], [place] Triage Specialist [phone]

## 2016-03-25 ENCOUNTER — Emergency Department (HOSPITAL_COMMUNITY)
Admission: EM | Admit: 2016-03-25 | Discharge: 2016-03-30 | Disposition: A | Discharge status: Other Acute Inpatient Hospital | Payer: Self-pay | Attending: Emergency Medicine | Admitting: Emergency Medicine

## 2016-03-25 DIAGNOSIS — F918 Other conduct disorders: Secondary | ICD-10-CM | POA: Insufficient documentation

## 2016-03-25 DIAGNOSIS — F22 Delusional disorders: Secondary | ICD-10-CM

## 2016-03-25 DIAGNOSIS — F600 Paranoid personality disorder: Secondary | ICD-10-CM | POA: Insufficient documentation

## 2016-03-25 DIAGNOSIS — F250 Schizoaffective disorder, bipolar type: Secondary | ICD-10-CM | POA: Diagnosis present

## 2016-03-25 DIAGNOSIS — R4689 Other symptoms and signs involving appearance and behavior: Secondary | ICD-10-CM

## 2016-03-25 LAB — COMPREHENSIVE METABOLIC PANEL
ALBUMIN: 4.9 g/dL (ref 3.5–5.0)
ALT: 9 U/L — ABNORMAL LOW (ref 14–54)
AST: 15 U/L (ref 15–41)
Alkaline Phosphatase: 56 U/L (ref 38–126)
Anion gap: 8 (ref 5–15)
BUN: 15 mg/dL (ref 6–20)
CHLORIDE: 107 mmol/L (ref 101–111)
CO2: 22 mmol/L (ref 22–32)
Calcium: 9.6 mg/dL (ref 8.9–10.3)
Creatinine, Ser: 0.91 mg/dL (ref 0.44–1.00)
GFR calc Af Amer: >60 mL/min (ref >60)
GFR calc non Af Amer: >60 mL/min (ref >60)
GLUCOSE: 97 mg/dL (ref 65–99)
POTASSIUM: 3.9 mmol/L (ref 3.5–5.1)
Sodium: 137 mmol/L (ref 135–145)
Total Bilirubin: 0.6 mg/dL (ref 0.3–1.2)
Total Protein: 9 g/dL — ABNORMAL HIGH (ref 6.5–8.1)

## 2016-03-25 LAB — CBC
HEMATOCRIT: 40.7 % (ref 36.0–46.0)
HEMOGLOBIN: 13.5 g/dL (ref 12.0–15.0)
MCH: 30.3 pg (ref 26.0–34.0)
MCHC: 33.2 g/dL (ref 30.0–36.0)
MCV: 91.5 fL (ref 78.0–100.0)
PLATELETS: 360 10*3/uL (ref 150–400)
RBC: 4.45 MIL/uL (ref 3.87–5.11)
RDW: 13 % (ref 11.5–15.5)
WBC: 9.6 10*3/uL (ref 4.0–10.5)

## 2016-03-25 LAB — ACETAMINOPHEN LEVEL: Acetaminophen (Tylenol), Serum: <10 ug/mL — ABNORMAL LOW (ref 10–30)

## 2016-03-25 LAB — SALICYLATE LEVEL: Salicylate Lvl: <4 mg/dL (ref 2.8–30.0)

## 2016-03-25 LAB — RAPID URINE DRUG SCREEN, HOSP PERFORMED
Amphetamines: NOT DETECTED
BARBITURATES: NOT DETECTED
BENZODIAZEPINES: NOT DETECTED
COCAINE: NOT DETECTED
Opiates: NOT DETECTED
TETRAHYDROCANNABINOL: NOT DETECTED

## 2016-03-25 LAB — I-STAT BETA HCG BLOOD, ED (MC, WL, AP ONLY)

## 2016-03-25 LAB — ETHANOL: Alcohol, Ethyl (B): <5 mg/dL (ref <5)

## 2016-03-25 MED ORDER — LORAZEPAM 1 MG PO TABS: 1.0000 mg | ORAL_TABLET | Freq: Three times a day (TID) | ORAL | Status: DC | PRN

## 2016-03-25 MED ORDER — ACETAMINOPHEN 325 MG PO TABS: 650.0000 mg | ORAL_TABLET | ORAL | Status: DC | PRN

## 2016-03-25 NOTE — ED Provider Notes (Signed)
[facility] DEPT Provider Note   CSN: [id number] Arrival date & time: 03/25/16  1931  First Provider Contact:  First MD Initiated Contact with Patient 03/25/16 2035   By signing my name below, I, [name], attest that this documentation has been prepared under the direction and in the presence of [name], PA-C. Electronically Signed: [name], ED Scribe. 03/25/16. 8:41 PM.   History   Chief Complaint Chief Complaint  Patient presents with  . Hallucinations  LEVEL 5 CAVEAT: HPI and ROS limited due to AMS.  HPI Comments: Valerie Howard with h/o paranoid schizophrenia is a 40 y.o. female who presents to the Emergency Department from home by EMS under IVC by her caseworker [name] [phone]) today. Pt has not been taking her medication for one month. She has been paranoid and hearing voices, jumping in front of moving cars, and throwing objects at her neighbors. Pt is speaking aggressively to herself and staff and was refusing to answer questions and threatening to call attorney. Pt is not in any acute distress at this time.   The history is provided by the patient and medical records. No language interpreter was used.    Past Medical History:  Diagnosis Date  . Paranoid behavior ([place])   . Schizophrenia [facility])     Patient Active Problem List   Diagnosis Date Noted  . Suicidal ideation   . Personality disorder 10/17/2012  . Anemia, iron deficiency 10/13/2012  . Tachycardia 10/01/2012  . 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 Schizophrenia, paranoid type ([place]) 12/07/2011    Class: Acute    Past Surgical History:  Procedure Laterality Date  . CESAREAN SECTION      OB History    No data available       Home Medications    Prior to Admission medications   Not on File    Family History Family History  Problem Relation Age of Onset  . Hypertension Other   . Diabetes Other   . Cancer Other     Social  History Social History  Substance Use Topics  . Smoking status: Never Smoker  . Smokeless tobacco: Not on file  . Alcohol use No     Allergies   Penicillins   Review of Systems Review of Systems  Unable to perform ROS: Mental status change     Physical Exam Updated Vital Signs BP 141/96 (BP Location: Right Arm)   Pulse 107   Temp 98.6 F (37 C) (Oral)   Resp 18   LMP  (LMP Unknown) Comment: "now" pt unwilling to state when began  SpO2 100%   Physical Exam  Constitutional: She appears well-developed and well-nourished.  HENT:  Head: Normocephalic.  Eyes: Conjunctivae are normal.  Cardiovascular: Normal rate.   Pulmonary/Chest: Effort normal. No respiratory distress.  Abdominal: She exhibits no distension.  Musculoskeletal: Normal range of motion.  Neurological: She is alert.  Skin: Skin is warm and dry.  Psychiatric: She has a normal mood and affect. Her behavior is normal.  Nursing note and vitals reviewed.    ED Treatments / Results  DIAGNOSTIC STUDIES: Oxygen Saturation is 100% on RA, normal by my interpretation.    COORDINATION OF CARE: 8:40 PM Discussed treatment plan with pt at bedside and pt agreed to plan.  Labs (all labs ordered are listed, but only abnormal results are displayed) Labs Reviewed  COMPREHENSIVE METABOLIC PANEL - Abnormal; Notable for the following:  Result Value   Total Protein 9.0 (*)    ALT 9 (*)    All other components within normal limits  ACETAMINOPHEN LEVEL - Abnormal; Notable for the following:    Acetaminophen (Tylenol), Serum <10 (*)    All other components within normal limits  ETHANOL  SALICYLATE LEVEL  CBC  URINE RAPID DRUG SCREEN, HOSP PERFORMED  I-STAT BETA HCG BLOOD, ED ([facility], [facility], [facility] ONLY)    EKG  EKG Interpretation None       Radiology No results found.  Procedures Procedures (including critical care time)  Medications Ordered in ED Medications  LORazepam (ATIVAN) tablet 1 mg (not  administered)  acetaminophen (TYLENOL) tablet 650 mg (not administered)     Initial Impression / Assessment and Plan / ED Course  I have reviewed the triage vital signs and the nursing notes.  Pertinent labs & imaging results that were available during my care of the patient were reviewed by me and considered in my medical decision making (see chart for details).  Clinical Course    Patient presents under IVC petition for paranoid and aggressive behavior. She refuses to offer history and is agitated and angry. She appears physically stable.   Medical clearance started - patient allows blood draw, vitals monitoring and UA collection. Blood pressure improves without intervention. She is considered medically cleared and is moved to the psych unit to wait for TTS involvement.    Final Clinical Impressions(s) / ED Diagnoses   Final diagnoses:  None  1. Paranoid 2. Aggressive behavior  New Prescriptions New Prescriptions   No medications on file  I personally performed the services described in this documentation, which was scribed in my presence. The recorded information has been reviewed and is accurate.      [name], PA-C 03/27/16 [place], DO 03/29/16 1520

## 2016-03-25 NOTE — BH Assessment (Addendum)
Assessment Note  Valerie Howard is an 40 y.o. female presenting to [facility] under IVC by her Case worker [name] [phone].  IVC states:  Danger to self and others. Respondent has unknown mental diagnosis according to petitioner and is on medication for that diagnosis. Respondent has not been taking her medication for past 30 days. Respondent has been previously committed in the past, petitioner does not have exact date. Respondent has been hallucinating and hearing voices, and believes people are in the house when they are not. Respondent believes that neighbors are taking her belongings. Respondent has jumped in front of a moving car and throws objects at the neighbor.   Patient denies allegations of the IVC and states that she knows that someone is breaking into her home and she has called and emailed law enforcement several times with no response. Patient states "they think it's funny." Patient states that she is not hallucinating and she knows that her neighbors are in her home and that her ex-husband is texting her neighbors to tell them to go into her home saying "she's crazy, nobody will believe her." Patient states that she is "not going back to [place]" and "I do not need to be here" several times throughout the assessment. Patient states "i am tired of seeing you and I'm tired of coming here because I don't need this, I don't have mental health problems." Patient then talked about [place] and she did not want to live there and she is upset with [facility] who "moved me over there." Patient appears to be paranoid about her neighbors and having flight of ideas. Patient denies history of trauma abuse. Patient denies use of drugs and alcohol and UDS and BAL are clear at time of assessment.   Consulted with Darlyne Russian, PA-C who recommends inpatient treatment at this time.     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 Paranoid behavior ([place])   . Schizophrenia [facility])     Past Surgical History:  Procedure Laterality Date  . CESAREAN SECTION      Family History:  Family History  Problem Relation Age of Onset  . Hypertension Other   . Diabetes Other   . Cancer Other     Social History:  reports that she has never smoked. She does not have any smokeless tobacco history on file. She reports that she does not drink alcohol or use drugs.  Additional Social History:  Alcohol / Drug Use Pain Medications: Denies Prescriptions: Denies Over the Counter: Denies History of alcohol / drug use?: No history of alcohol / drug abuse  CIWA: CIWA-Ar BP: 141/96 Pulse Rate: 107 COWS:    Allergies:  Allergies  Allergen Reactions  . Penicillins Rash    Has patient had a PCN reaction causing immediate rash, facial/tongue/throat swelling, SOB or lightheadedness with hypotension:Patient refuses to answer (PRA) Has patient had a PCN reaction causing severe rash involving mucus membranes or skin necrosis:PRA Has patient had a PCN reaction that required hospitalization PRA Has patient had a PCN reaction occurring within the last 10 years:PRA If all of the above answers are "NO", then may proceed with Cephalosporin use.     Home Medications:  (Not in a hospital admission)  OB/GYN Status:  No LMP recorded (lmp unknown).  General Assessment Data [facility] Assessment: In system Is this  a Tele or Face-to-Face Assessment?: Face-to-Face Is this an Initial Assessment or a Re-assessment for this encounter?: Initial Assessment Marital status: Divorced Is patient pregnant?: No Pregnancy Status: No Living Arrangements: Alone Can pt return to current living arrangement?: Yes Admission Status: Involuntary Is patient capable of signing voluntary admission?: Yes (IVC) Referral Source: Other (IVC)     Crisis Care Plan Living Arrangements:  Alone Name of Psychiatrist: None Name of Therapist: None  Education Status Is patient currently in school?: No Highest grade of school patient has completed: Masters  Risk to self with the past 6 months Suicidal Ideation: No Has patient been a risk to self within the past 6 months prior to admission? : No Suicidal Intent: No Has patient had any suicidal intent within the past 6 months prior to admission? : No Is patient at risk for suicide?: No Suicidal Plan?: No Has patient had any suicidal plan within the past 6 months prior to admission? : No Access to Means: No What has been your use of drugs/alcohol within the last 12 months?: Denies Previous Attempts/Gestures: No How many times?: 0 Other Self Harm Risks: Denies Triggers for Past Attempts: None known Intentional Self Injurious Behavior: None Family Suicide History: No Recent stressful life event(s): Other (Comment) Persecutory voices/beliefs?: No Depression: No (Denies) Depression Symptoms:  (Denies) Substance abuse history and/or treatment for substance abuse?: No Suicide prevention information given to non-admitted patients: Not applicable  Risk to Others within the past 6 months Homicidal Ideation: No Does patient have any lifetime risk of violence toward others beyond the six months prior to admission? : No Thoughts of Harm to Others: No Current Homicidal Intent: No Current Homicidal Plan: No Access to Homicidal Means: No Identified Victim: Denies History of harm to others?: No Assessment of Violence: None Noted Violent Behavior Description: Denies Does patient have access to weapons?: No Criminal Charges Pending?: No Does patient have a court date: No Is patient on probation?: No  Psychosis Hallucinations: None noted Delusions: Unspecified (patient thinks that people are breaking into her home)  Mental Status Report Appearance/Hygiene: In scrubs Eye Contact: Good Motor Activity: Unable to  assess Speech: Logical/coherent Level of Consciousness: Alert Mood: Irritable Affect: Irritable Anxiety Level: None Thought Processes: Flight of Ideas Judgement: Partial Orientation: Person, Place, Time, Situation, Appropriate for developmental age Obsessive Compulsive Thoughts/Behaviors: None  Cognitive Functioning Concentration: Normal Memory: Recent Intact, Remote Intact IQ: Average Insight: see judgement above Impulse Control: Unable to Assess Appetite: Good Sleep: Unable to Assess Vegetative Symptoms: Unable to Assess  ADLScreening [facility] Assessment Services) Patient's cognitive ability adequate to safely complete daily activities?: Yes Patient able to express need for assistance with ADLs?: Yes Independently performs ADLs?: Yes (appropriate for developmental age)  Prior Inpatient Therapy Prior Inpatient Therapy: Yes Prior Therapy Dates: Multiple Prior Therapy Facilty/Provider(s): Multiple Reason for Treatment: Schizoaffective Disorder  Prior Outpatient Therapy Prior Outpatient Therapy: No Prior Therapy Dates: N/A Prior Therapy Facilty/Provider(s): N/A Reason for Treatment: N/A Does patient have an ACCT team?: No Does patient have Intensive In-House Services?  : No Does patient have [facility] services? : No Does patient have P4CC services?: No  ADL Screening (condition at time of admission) Patient's cognitive ability adequate to safely complete daily activities?: Yes Is the patient deaf or have difficulty hearing?: No Does the patient have difficulty seeing, even when wearing glasses/contacts?: No Does the patient have difficulty concentrating, remembering, or making decisions?: No Patient able to express need for assistance with ADLs?: Yes Does the patient have difficulty dressing  or bathing?: No Independently performs ADLs?: Yes (appropriate for developmental age) Does the patient have difficulty walking or climbing stairs?: No Weakness of Legs: None Weakness  of Arms/Hands: None  Home Assistive Devices/Equipment Home Assistive Devices/Equipment: None  Therapy Consults (therapy consults require a physician order) PT Evaluation Needed: No OT Evalulation Needed: No SLP Evaluation Needed: No Abuse/Neglect Assessment (Assessment to be complete while patient is alone) Physical Abuse: Denies Verbal Abuse: Denies Sexual Abuse: Denies Exploitation of patient/patient's resources: Denies Self-Neglect: Denies Values / Beliefs Cultural Requests During Hospitalization: None Spiritual Requests During Hospitalization: None Consults Spiritual Care Consult Needed: No Social Work Consult Needed: No Regulatory affairs officer (For Healthcare) Does patient have an advance directive?: No Would patient like information on creating an advanced directive?: No - patient declined information    Additional Information 1:1 In Past 12 Months?: No CIRT Risk: No Elopement Risk: No Does patient have medical clearance?: Yes     Disposition:  Disposition Initial Assessment Completed for this Encounter: Yes Disposition of Patient: Inpatient treatment program (per Darlyne Russian, PA-C) Type of inpatient treatment program: Adult  On Site Evaluation by:   Reviewed with Physician:    Leeza Heiner 03/26/2016 2:26 AM

## 2016-03-25 NOTE — ED Notes (Signed)
Pt. Transferred to [facility] from ED to room 35. Report from Autumn RN. Pt. Oriented to unit including Q15 minute rounds as well as the security cameras for their protection. Patient is alert, warm and dry in no acute distress. Patient refuses to answer questions and  repeats over and over that she "doesn't need to be here" . Pt. Encouraged to let me know if needs arise.

## 2016-03-25 NOTE — ED Triage Notes (Addendum)
Pt transported from home by EMS under IVC by case Worker [name] [phone]), per IVC has hx of mental illness, pt has not been taking meds x 30 days, hearing voices, paranoid, jumped in front of moving car and throwing objects at neighbors.  Upon arrival, pt rambling, stating people are trying to [facility] her (pt spelling [facility]) and pacing.   Pt speaking aggressively to myself and staff, pt refusing to answer triage questions. Pt states she will not be touched, demanding to call attorney. Pt also states "you will not take to [place]" When questioned regarding LMP pt states "you have been watching, you should know"

## 2016-03-25 NOTE — Progress Notes (Signed)
Patient listed as having no pcp or insurance with [employer] address. Patient has had [facility] admission in the past. Per chart review, when patient was here in January, patient lived at home with her four children ages at that time 19,`39, and 42 year old twins. Oldest daughter Grant Fontana [phone]. EDCM did not speak to patient at this time regarding pcp/insurnace information due to patient's current mental status.  EDCM did not disturb patient at this time.

## 2016-03-25 NOTE — ED Notes (Signed)
Bed: [facility] Expected date:  Expected time:  Means of arrival:  Comments: Held for room 9

## 2016-03-26 DIAGNOSIS — F250 Schizoaffective disorder, bipolar type: Secondary | ICD-10-CM | POA: Diagnosis present

## 2016-03-26 MED ORDER — OLANZAPINE 10 MG IM SOLR
10.0000 mg | Freq: Once | INTRAMUSCULAR | Status: AC
  Administered 2016-03-26: 10 mg via INTRAMUSCULAR
  Filled 2016-03-26: qty 10

## 2016-03-26 MED ORDER — STERILE WATER FOR INJECTION IJ SOLN
INTRAMUSCULAR | Status: AC
  Filled 2016-03-26: qty 10

## 2016-03-26 MED ORDER — STERILE WATER FOR INJECTION IJ SOLN
INTRAMUSCULAR | Status: AC
  Administered 2016-03-26: 2.1 mL
  Filled 2016-03-26: qty 10

## 2016-03-26 MED ORDER — DIPHENHYDRAMINE HCL 50 MG/ML IJ SOLN
50.0000 mg | Freq: Once | INTRAMUSCULAR | Status: DC | PRN
Start: 2016-03-26 — End: 2016-03-30

## 2016-03-26 MED ORDER — DIPHENHYDRAMINE HCL 50 MG/ML IJ SOLN
50.0000 mg | Freq: Once | INTRAMUSCULAR | Status: AC
  Administered 2016-03-26: 50 mg via INTRAMUSCULAR
  Filled 2016-03-26: qty 1

## 2016-03-26 MED ORDER — OLANZAPINE 10 MG IM SOLR
10.0000 mg | Freq: Two times a day (BID) | INTRAMUSCULAR | Status: DC
  Administered 2016-03-26: 10 mg via INTRAMUSCULAR
  Filled 2016-03-26: qty 10

## 2016-03-26 NOTE — ED Notes (Signed)
Pt. Refusing meds and cursing at this nurse but eventually agreed to injection.

## 2016-03-26 NOTE — ED Notes (Signed)
Snack and beverage given. 

## 2016-03-26 NOTE — ED Notes (Addendum)
Pt up in hall initially declined to talk to MD, but then agreed.  Pt returned to room to talk with Dr Dwyane Dee and Theodoro Clock DNP. Sitting in chair with arms crossed, irritated, defensive.  Pt sts that" I don't need to be here...shouldn't be here.Marland KitchenMarland KitchenDon't need medication...don't have a mental illness...don't need your services..."  Pt angry, reports that she lives by herself.

## 2016-03-26 NOTE — ED Notes (Addendum)
Pt up in hall walking to room angry, irritated  "don't want to see your face...don't need to see you..."  To this Probation officer.

## 2016-03-26 NOTE — ED Notes (Signed)
EDP here to see.

## 2016-03-26 NOTE — ED Notes (Signed)
Pt in the hall sts that she "does not need any forced medications...am going to be released today."  Pt is aware that she is not going to be released.

## 2016-03-26 NOTE — ED Notes (Signed)
Patient noted sleeping in room. No complaints, stable, in no acute distress. Q15 minute rounds and monitoring via Security Cameras to continue.  

## 2016-03-26 NOTE — ED Notes (Signed)
Jamison DNP updated,  No change in medication orders.

## 2016-03-26 NOTE — ED Notes (Signed)
Patient noted in day room. Stable, in no acute distress. Q15 minute rounds and monitoring via [employer] to continue. Patient refuses to answer questions and states she refuses all medications.

## 2016-03-26 NOTE — ED Notes (Signed)
Patient noted in shower room. No complaints, stable, in no acute distress. Q15 minute rounds and monitoring via [employer] to continue.

## 2016-03-26 NOTE — ED Notes (Signed)
Report to Gary RN

## 2016-03-26 NOTE — ED Notes (Addendum)
Standing at phone talking at staff, loud, angry at times.

## 2016-03-26 NOTE — ED Notes (Signed)
Up to room to eat lunch, ambulatory w/o difficulty

## 2016-03-26 NOTE — ED Provider Notes (Signed)
Asked to evaluate patient at bedside.  Patient refusing medications.  During evaluation patient belligerent, paranoid.  Will not give much of a history.  Patient would benefit from treatment with medication to stabilize her mental health and to keep her safe.  Patient has also been aggressive to staff and has been threatening staff.  Agree with Dr. Ronnie Derby note and the necessity of forced medications at this time for patient safety and staff safety.   Harvel Quale, MD 03/26/16 1100

## 2016-03-26 NOTE — Progress Notes (Signed)
Disposition CSW completed patient referrals to the following inpatient psych facilities:  [facility]  CSW will continue to follow patient for placement needs.  [facility] Disposition CSW [phone]

## 2016-03-26 NOTE — ED Notes (Signed)
Up to the desk for shower supplies polite, pleasent

## 2016-03-26 NOTE — ED Notes (Signed)
Pt sitting in room, security present.  Pt agreed to take medication, but was argumentative about taking it, that she  that she did not need it.  Pt declined to put arm band on, but did verifiy name and DOB from arm band.

## 2016-03-26 NOTE — ED Notes (Signed)
Report from [facility]. Patient sleeping in day room, respirations regular and unlabored. Q15 minute rounds and security camera observation to continue.

## 2016-03-26 NOTE — Progress Notes (Signed)
[facility] Second Physician Opinion Progress Note for Medication Administration to Non-consenting Patients (For Involuntarily Committed Patients)  Patient: Valerie Howard Date of Birth: [birth year] MRN: [id number]  Reason for the Medication: There is, without the benefit of the specific treatment measure, a significant possibility that the patient will harm self or others before improvement of the patient's condition is realized.  Consideration of Side Effects: Consideration of the side effects related to the medication plan has been given.  Rationale for Medication Administration: Patient is psychotic, agitated, talking to self, refusing treatment. Patient is intrusive, cannot be redirected, needs antipsychotic to help stabilize her and fror safety of self and others    Hampton Abbot, MD 03/26/16  10:38 AM   This documentation is good for (7) seven days from the date of the MD signature. New documentation must be completed every seven (7) days with detailed justification in the medical record if the patient requires continued non-emergent administration of psychotropic medications.

## 2016-03-26 NOTE — ED Notes (Signed)
Zyprexa injection given with Security and [employer] in attendance.

## 2016-03-26 NOTE — ED Notes (Signed)
In room eatting supper

## 2016-03-26 NOTE — ED Notes (Signed)
On the phone 

## 2016-03-26 NOTE — ED Notes (Signed)
Pt in front of desk decline to talk with the MD

## 2016-03-26 NOTE — Progress Notes (Signed)
[facility] MD Progress Note  03/26/2016 10:43 AM [name]  MRN:  [id number] Subjective: Valerie Howard is an 40 y.o. female presenting to [facility] under IVC by her Case worker [name] [phone]. Patient has been hallucinating and hearing voices, and believes people are in the house when they are not. Respondent believes that neighbors are taking her belongings. Respondent has jumped in front of a moving car and throws objects at the neighbor. Patient states that she knows that someone is breaking into her home and she has called and emailed law enforcement several times with no response.  Principal Problem: <principal problem not specified> Diagnosis:   Patient Active Problem List   Diagnosis Date Noted  . Schizoaffective disorder, bipolar type ([place]) [F25.0]   . Suicidal ideation [R45.851]   . Personality disorder [F60.9] 10/17/2012  . Anemia, iron deficiency [D50.9] 10/13/2012  . Tachycardia [R00.0] 10/01/2012  . 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 Schizophrenia, paranoid type ([place]) [F20.0] 12/07/2011    Class: Acute   Total Time spent with patient: 30 minutes  Past Psychiatric History:history of schizoaffective disorder, non compliant with medications  Past Medical History:  Past Medical History:  Diagnosis Date  . Paranoid behavior ([place])   . Schizophrenia [facility])     Past Surgical History:  Procedure Laterality Date  . CESAREAN SECTION     Family History:  Family History  Problem Relation Age of Onset  . Hypertension Other   . Diabetes Other   . Cancer Other    Family Psychiatric  History: Patient refuses to give information Social History:  History  Alcohol Use No     History  Drug Use No    Social History   Social History  . Marital status: Legally Separated    Spouse name: N/A  . Number of children: N/A  . Years of education: N/A   Social History Main Topics  . Smoking status: Never Smoker  . Smokeless  tobacco: Not on file  . Alcohol use No  . Drug use: No  . Sexual activity: No   Other Topics Concern  . Not on file   Social History Narrative  . No narrative on file   Additional Social History:    Pain Medications: Denies Prescriptions: Denies Over the Counter: Denies History of alcohol / drug use?: No history of alcohol / drug abuse                    Sleep: Poor  Appetite:  Fair  Current Medications: Current Facility-Administered Medications  Medication Dose Route Frequency Provider Last Rate Last Dose  . acetaminophen (TYLENOL) tablet 650 mg  650 mg Oral Q4H PRN [name], PA-C      . LORazepam (ATIVAN) tablet 1 mg  1 mg Oral Q8H PRN [name], PA-C       No current outpatient prescriptions on file.    Lab Results:  Results for orders placed or performed during the hospital encounter of 03/25/16 (from the past 48 hour(s))  Rapid urine drug screen (hospital performed)     Status: None   Collection Time: 03/25/16  7:41 PM  Result Value Ref Range   Opiates NONE DETECTED NONE DETECTED   Cocaine NONE DETECTED NONE DETECTED   Benzodiazepines NONE DETECTED NONE DETECTED   Amphetamines NONE DETECTED NONE DETECTED   Tetrahydrocannabinol NONE DETECTED NONE DETECTED   Barbiturates NONE DETECTED NONE DETECTED    Comment:  DRUG SCREEN FOR MEDICAL PURPOSES ONLY.  IF CONFIRMATION IS NEEDED FOR ANY PURPOSE, NOTIFY LAB WITHIN 5 DAYS.        LOWEST DETECTABLE LIMITS FOR URINE DRUG SCREEN Drug Class       Cutoff (ng/mL) Amphetamine      1000 Barbiturate      200 Benzodiazepine   030 Tricyclics       092 Opiates          300 Cocaine          300 THC              50   Comprehensive metabolic panel     Status: Abnormal   Collection Time: 03/25/16  9:30 PM  Result Value Ref Range   Sodium 137 135 - 145 mmol/L   Potassium 3.9 3.5 - 5.1 mmol/L   Chloride 107 101 - 111 mmol/L   CO2 22 22 - 32 mmol/L   Glucose, Bld 97 65 - 99 mg/dL   BUN 15 6 - 20  mg/dL   Creatinine, Ser 0.91 0.44 - 1.00 mg/dL   Calcium 9.6 8.9 - 10.3 mg/dL   Total Protein 9.0 (H) 6.5 - 8.1 g/dL   Albumin 4.9 3.5 - 5.0 g/dL   AST 15 15 - 41 U/L   ALT 9 (L) 14 - 54 U/L   Alkaline Phosphatase 56 38 - 126 U/L   Total Bilirubin 0.6 0.3 - 1.2 mg/dL   GFR calc non Af Amer >60 >60 mL/min   GFR calc Af Amer >60 >60 mL/min    Comment: (NOTE) The eGFR has been calculated using the CKD EPI equation. This calculation has not been validated in all clinical situations. eGFR's persistently <60 mL/min signify possible Chronic Kidney Disease.    Anion gap 8 5 - 15  cbc     Status: None   Collection Time: 03/25/16  9:30 PM  Result Value Ref Range   WBC 9.6 4.0 - 10.5 K/uL   RBC 4.45 3.87 - 5.11 MIL/uL   Hemoglobin 13.5 12.0 - 15.0 g/dL   HCT 40.7 36.0 - 46.0 %   MCV 91.5 78.0 - 100.0 fL   MCH 30.3 26.0 - 34.0 pg   MCHC 33.2 30.0 - 36.0 g/dL   RDW 13.0 11.5 - 15.5 %   Platelets 360 150 - 400 K/uL  Ethanol     Status: None   Collection Time: 03/25/16  9:31 PM  Result Value Ref Range   Alcohol, Ethyl (B) <5 <5 mg/dL    Comment:        LOWEST DETECTABLE LIMIT FOR SERUM ALCOHOL IS 5 mg/dL FOR MEDICAL PURPOSES ONLY   Salicylate level     Status: None   Collection Time: 03/25/16  9:31 PM  Result Value Ref Range   Salicylate Lvl <3.3 2.8 - 30.0 mg/dL  Acetaminophen level     Status: Abnormal   Collection Time: 03/25/16  9:31 PM  Result Value Ref Range   Acetaminophen (Tylenol), Serum <10 (L) 10 - 30 ug/mL    Comment:        THERAPEUTIC CONCENTRATIONS VARY SIGNIFICANTLY. A RANGE OF 10-30 ug/mL MAY BE AN EFFECTIVE CONCENTRATION FOR MANY PATIENTS. HOWEVER, SOME ARE BEST TREATED AT CONCENTRATIONS OUTSIDE THIS RANGE. ACETAMINOPHEN CONCENTRATIONS >150 ug/mL AT 4 HOURS AFTER INGESTION AND >50 ug/mL AT 12 HOURS AFTER INGESTION ARE OFTEN ASSOCIATED WITH TOXIC REACTIONS.   I-Stat beta hCG blood, ED     Status: None   Collection  Time: 03/25/16  9:48 PM  Result Value  Ref Range   I-stat hCG, quantitative <5.0 <5 mIU/mL   Comment 3            Comment:   GEST. AGE      CONC.  (mIU/mL)   <=1 WEEK        5 - 50     2 WEEKS       50 - 500     3 WEEKS       100 - 10,000     4 WEEKS     1,000 - 30,000        FEMALE AND NON-PREGNANT FEMALE:     LESS THAN 5 mIU/mL     Blood Alcohol level:  Lab Results  Component Value Date   ETH <5 03/25/2016   ETH <5 84/13/2440    Metabolic Disorder Labs: No results found for: HGBA1C, MPG No results found for: PROLACTIN No results found for: CHOL, TRIG, HDL, CHOLHDL, VLDL, LDLCALC  Physical Findings: AIMS:  , ,  ,  ,    CIWA:    COWS:     Musculoskeletal: Strength & Muscle Tone: within normal limits Gait & Station: normal Patient leans: N/A  Psychiatric Specialty Exam: Physical Exam  Review of Systems  Constitutional: Negative.  Negative for fever and malaise/fatigue.  HENT: Negative.  Negative for congestion and sore throat.   Eyes: Negative.  Negative for blurred vision and discharge.  Respiratory: Negative.  Negative for cough and shortness of breath.   Cardiovascular: Negative.  Negative for chest pain and palpitations.  Gastrointestinal: Negative.  Negative for abdominal pain, constipation, diarrhea, heartburn, nausea and vomiting.  Musculoskeletal: Negative.  Negative for falls and myalgias.  Skin: Negative.  Negative for rash.  Neurological: Negative.  Negative for dizziness, seizures, loss of consciousness, weakness and headaches.  Endo/Heme/Allergies: Negative.  Negative for environmental allergies.  Psychiatric/Behavioral: Positive for hallucinations. Negative for depression and suicidal ideas. The patient has insomnia. The patient is not nervous/anxious.     Blood pressure 141/96, pulse 107, temperature 98.6 F (37 C), temperature source Oral, resp. rate 18, SpO2 100 %.There is no height or weight on file to calculate BMI.  General Appearance: Disheveled  Eye Contact:  Fair  Speech:   Pressured and loud  Volume:  Increased  Mood:  Irritable and agitated  Affect:  Irritable and agitated  Thought Process:  Descriptions of Associations: Circumstantial  Orientation:  Other:  place, person  Thought Content:  Hallucinations: Auditory  Suicidal Thoughts:  No  Homicidal Thoughts:  No  Memory:  Immediate;   Poor Recent;   Poor Remote;   Poor  Judgement:  Poor  Insight:  Lacking  Psychomotor Activity:  Mannerisms and Restlessness  Concentration:  Concentration: Poor and Attention Span: Poor  Recall:  Poor  Fund of Knowledge:  Fair  Language:  Fair  Akathisia:  No  Handed:  Right  AIMS (if indicated):     Assets:  Housing  ADL's:  Impaired  Cognition:  Impaired,  Moderate  Sleep:        Treatment Plan Summary: Daily contact with patient to assess and evaluate symptoms and progress in treatment, Medication management and Plan Patient agitated, irritable, refusing medications, EDP has done first opinion, second opinion completed in chart. Patient cannot be redirected, is intrusive, getting louder and agitated and needs antipsychotic for stabilization  Hampton Abbot, MD 03/26/2016, 10:43 AM

## 2016-03-26 NOTE — BH Assessment (Signed)
Assessment completed. Consulted with Darlyne Russian, PA-C who recommends inpatient treatment at this time.   Rosalin Hawking, LCSW Therapeutic Triage Specialist [place] 03/26/2016 1:21 AM

## 2016-03-26 NOTE — ED Notes (Signed)
Patient noted sleeping in day room. No complaints, stable, in no acute distress. Q15 minute rounds and monitoring via [employer] to continue.

## 2016-03-26 NOTE — ED Notes (Addendum)
EDP and TTS into see.  Pt reported that she does not take medication because she "doesn't feel like taking it...don't think I need medication..."  Pt also reports that people are breaking in to her apartment.

## 2016-03-26 NOTE — ED Notes (Signed)
Patient noted in room. Stable, in no acute distress. Q15 minute rounds and monitoring via [employer] to continue.

## 2016-03-26 NOTE — Clinical Social Work Note (Signed)
CSW provided paperwork per psychiatry request to uphold patient IVC status.  Forms signed and placed in patient's chart.  Dede Query, LCSW [place] Worker - Weekend Coverage cell #: [phone]

## 2016-03-26 NOTE — ED Notes (Signed)
Patient noted in shower room. Stable, in no acute distress. Q15 minute rounds and monitoring via [employer] to continue.

## 2016-03-26 NOTE — ED Notes (Signed)
Up to the bathroom 

## 2016-03-27 MED ORDER — OLANZAPINE 10 MG PO TBDP
10.0000 mg | ORAL_TABLET | Freq: Two times a day (BID) | ORAL | Status: DC
  Filled 2016-03-27 (×5): qty 1

## 2016-03-27 MED ORDER — STERILE WATER FOR INJECTION IJ SOLN
INTRAMUSCULAR | Status: AC
  Administered 2016-03-27: 2.1 mL
  Filled 2016-03-27: qty 10

## 2016-03-27 MED ORDER — OLANZAPINE 10 MG IM SOLR
10.0000 mg | Freq: Two times a day (BID) | INTRAMUSCULAR | Status: DC
  Administered 2016-03-27 – 2016-03-30 (×7): 10 mg via INTRAMUSCULAR
  Filled 2016-03-27 (×7): qty 10

## 2016-03-27 NOTE — ED Notes (Signed)
Pt. At nurses desk argumentatively interacting with nursing staff. Redirection attempted with limited success. Pt. Directed back to her room.

## 2016-03-27 NOTE — ED Notes (Signed)
Patient given the choice of IM or PO Zyprexa, but declined to do so.

## 2016-03-27 NOTE — ED Notes (Addendum)
Sitting on bed. Pt sts that this writer never offered her the pill "you liar..." and sts that she will take the pill.  When offered the pill she again  refused. Pt medicated w/o diffiulty, security present. Pt irritable, angry.  Pt also asking about weight gain with the medication

## 2016-03-27 NOTE — ED Notes (Signed)
Writer asked patient if she could obtain the patient  Vitals, and patient said "nawl i'm good" Rn Valerie Howard made aware of refusal

## 2016-03-27 NOTE — ED Notes (Addendum)
Pt up to the phone, when asked if she will take medication by mouth she ignored this Probation officer and went to the desk to discuss medication with other nurse.  Pt sts that she "does not need medicine.." Pt sts that we (staff) are being irritating..."  Pt irritated, angry, and will not acknowledge this writers questions.

## 2016-03-27 NOTE — ED Notes (Signed)
Report to Gary RN

## 2016-03-27 NOTE — ED Notes (Signed)
Report to include Situation, Background, Assessment, and Recommendations received from [place]. Patient alert and oriented, warm and dry, in no acute distress. Patient refuses to answer questions and states "I don't need any thing from you, including any medications". Patient made aware of Q15 minute rounds and security cameras for their safety. Patient instructed to come to me with needs or concerns.

## 2016-03-27 NOTE — Consult Note (Signed)
[facility] Psychiatry Consult   Reason for Consult:  Psychosis  Referring Physician:  EDP Patient Identification: Valerie Howard MRN:  [id number] Principal Diagnosis: Schizoaffective disorder, bipolar type: Diagnosis:   Patient Active Problem List   Diagnosis Date Noted  . 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 Schizoaffective disorder, bipolar type ([place]) [F25.0]   . Suicidal ideation [R45.851]   . Personality disorder [F60.9] 10/17/2012  . Anemia, iron deficiency [D50.9] 10/13/2012  . Tachycardia [R00.0] 10/01/2012  . Schizophrenia, paranoid type ([place]) [F20.0] 12/07/2011    Class: Acute    Total Time spent with patient: 30 minutes  Subjective:   Valerie Howard is a 40 y.o. female patient admitted with psychosis.  HPI:  40 yo female who was IVC'd by her caseworker for noncompliance, paranoia, auditory hallucinations, and jumping in front of moving cars.  Patient will not take medication voluntarily and has had to have antipsychotic injections.  She remains irritable, refusing medications, and labile but has improved a small amount.  Past Psychiatric History: schizoaffective disorder  Risk to Self: Suicidal Ideation: No Suicidal Intent: No Is patient at risk for suicide?: No Suicidal Plan?: No Access to Means: No What has been your use of drugs/alcohol within the last 12 months?: Denies How many times?: 0 Other Self Harm Risks: Denies Triggers for Past Attempts: None known Intentional Self Injurious Behavior: None Risk to Others: Homicidal Ideation: No Thoughts of Harm to Others: No Current Homicidal Intent: No Current Homicidal Plan: No Access to Homicidal Means: No Identified Victim: Denies History of harm to others?: No Assessment of Violence: None Noted Violent Behavior Description: Denies Does patient have access to weapons?: No Criminal Charges Pending?: No Does patient have a court date:  No Prior Inpatient Therapy: Prior Inpatient Therapy: Yes Prior Therapy Dates: Multiple Prior Therapy Facilty/Provider(s): Multiple Reason for Treatment: Schizoaffective Disorder Prior Outpatient Therapy: Prior Outpatient Therapy: No Prior Therapy Dates: N/A Prior Therapy Facilty/Provider(s): N/A Reason for Treatment: N/A Does patient have an ACCT team?: No Does patient have Intensive In-House Services?  : No Does patient have [facility] services? : No Does patient have P4CC services?: No  Past Medical History:  Past Medical History:  Diagnosis Date  . Paranoid behavior ([place])   . Schizophrenia [facility])     Past Surgical History:  Procedure Laterality Date  . CESAREAN SECTION     Family History:  Family History  Problem Relation Age of Onset  . Hypertension Other   . Diabetes Other   . Cancer Other    Family Psychiatric  History: none Social History:  History  Alcohol Use No     History  Drug Use No    Social History   Social History  . Marital status: Legally Separated    Spouse name: N/A  . Number of children: N/A  . Years of education: N/A   Social History Main Topics  . Smoking status: Never Smoker  . Smokeless tobacco: Not on file  . Alcohol use No  . Drug use: No  . Sexual activity: No   Other Topics Concern  . Not on file   Social History Narrative  . No narrative on file   Additional Social History:    Allergies:   Allergies  Allergen Reactions  . Penicillins Rash    Has patient had a PCN reaction causing immediate rash, facial/tongue/throat swelling, SOB or lightheadedness with hypotension:Patient refuses to answer (PRA) Has patient had  a PCN reaction causing severe rash involving mucus membranes or skin necrosis:PRA Has patient had a PCN reaction that required hospitalization PRA Has patient had a PCN reaction occurring within the last 10 years:PRA If all of the above answers are "NO", then may proceed with Cephalosporin use.     Labs:   Results for orders placed or performed during the hospital encounter of 03/25/16 (from the past 48 hour(s))  Rapid urine drug screen (hospital performed)     Status: None   Collection Time: 03/25/16  7:41 PM  Result Value Ref Range   Opiates NONE DETECTED NONE DETECTED   Cocaine NONE DETECTED NONE DETECTED   Benzodiazepines NONE DETECTED NONE DETECTED   Amphetamines NONE DETECTED NONE DETECTED   Tetrahydrocannabinol NONE DETECTED NONE DETECTED   Barbiturates NONE DETECTED NONE DETECTED    Comment:        DRUG SCREEN FOR MEDICAL PURPOSES ONLY.  IF CONFIRMATION IS NEEDED FOR ANY PURPOSE, NOTIFY LAB WITHIN 5 DAYS.        LOWEST DETECTABLE LIMITS FOR URINE DRUG SCREEN Drug Class       Cutoff (ng/mL) Amphetamine      1000 Barbiturate      200 Benzodiazepine   509 Tricyclics       326 Opiates          300 Cocaine          300 THC              50   Comprehensive metabolic panel     Status: Abnormal   Collection Time: 03/25/16  9:30 PM  Result Value Ref Range   Sodium 137 135 - 145 mmol/L   Potassium 3.9 3.5 - 5.1 mmol/L   Chloride 107 101 - 111 mmol/L   CO2 22 22 - 32 mmol/L   Glucose, Bld 97 65 - 99 mg/dL   BUN 15 6 - 20 mg/dL   Creatinine, Ser 0.91 0.44 - 1.00 mg/dL   Calcium 9.6 8.9 - 10.3 mg/dL   Total Protein 9.0 (H) 6.5 - 8.1 g/dL   Albumin 4.9 3.5 - 5.0 g/dL   AST 15 15 - 41 U/L   ALT 9 (L) 14 - 54 U/L   Alkaline Phosphatase 56 38 - 126 U/L   Total Bilirubin 0.6 0.3 - 1.2 mg/dL   GFR calc non Af Amer >60 >60 mL/min   GFR calc Af Amer >60 >60 mL/min    Comment: (NOTE) The eGFR has been calculated using the CKD EPI equation. This calculation has not been validated in all clinical situations. eGFR's persistently <60 mL/min signify possible Chronic Kidney Disease.    Anion gap 8 5 - 15  cbc     Status: None   Collection Time: 03/25/16  9:30 PM  Result Value Ref Range   WBC 9.6 4.0 - 10.5 K/uL   RBC 4.45 3.87 - 5.11 MIL/uL   Hemoglobin 13.5 12.0 - 15.0 g/dL    HCT 40.7 36.0 - 46.0 %   MCV 91.5 78.0 - 100.0 fL   MCH 30.3 26.0 - 34.0 pg   MCHC 33.2 30.0 - 36.0 g/dL   RDW 13.0 11.5 - 15.5 %   Platelets 360 150 - 400 K/uL  Ethanol     Status: None   Collection Time: 03/25/16  9:31 PM  Result Value Ref Range   Alcohol, Ethyl (B) <5 <5 mg/dL    Comment:        LOWEST DETECTABLE LIMIT FOR  SERUM ALCOHOL IS 5 mg/dL FOR MEDICAL PURPOSES ONLY   Salicylate level     Status: None   Collection Time: 03/25/16  9:31 PM  Result Value Ref Range   Salicylate Lvl <7.0 2.8 - 30.0 mg/dL  Acetaminophen level     Status: Abnormal   Collection Time: 03/25/16  9:31 PM  Result Value Ref Range   Acetaminophen (Tylenol), Serum <10 (L) 10 - 30 ug/mL    Comment:        THERAPEUTIC CONCENTRATIONS VARY SIGNIFICANTLY. A RANGE OF 10-30 ug/mL MAY BE AN EFFECTIVE CONCENTRATION FOR MANY PATIENTS. HOWEVER, SOME ARE BEST TREATED AT CONCENTRATIONS OUTSIDE THIS RANGE. ACETAMINOPHEN CONCENTRATIONS >150 ug/mL AT 4 HOURS AFTER INGESTION AND >50 ug/mL AT 12 HOURS AFTER INGESTION ARE OFTEN ASSOCIATED WITH TOXIC REACTIONS.   I-Stat beta hCG blood, ED     Status: None   Collection Time: 03/25/16  9:48 PM  Result Value Ref Range   I-stat hCG, quantitative <5.0 <5 mIU/mL   Comment 3            Comment:   GEST. AGE      CONC.  (mIU/mL)   <=1 WEEK        5 - 50     2 WEEKS       50 - 500     3 WEEKS       100 - 10,000     4 WEEKS     1,000 - 30,000        FEMALE AND NON-PREGNANT FEMALE:     LESS THAN 5 mIU/mL     Current Facility-Administered Medications  Medication Dose Route Frequency Provider Last Rate Last Dose  . acetaminophen (TYLENOL) tablet 650 mg  650 mg Oral Q4H PRN [name], PA-C      . diphenhydrAMINE (BENADRYL) injection 50 mg  50 mg Intramuscular Once PRN Patrecia Pour, NP      . OLANZapine zydis (ZYPREXA) disintegrating tablet 10 mg  10 mg Oral BID Hampton Abbot, MD       Or  . OLANZapine (ZYPREXA) injection 10 mg  10 mg Intramuscular BID Hampton Abbot, MD   10 mg at 03/27/16 1038   No current outpatient prescriptions on file.    Musculoskeletal: Strength & Muscle Tone: within normal limits Gait & Station: normal Patient leans: N/A  Psychiatric Specialty Exam: Physical Exam  Constitutional: She is oriented to person, place, and time. She appears well-developed and well-nourished.  HENT:  Head: Normocephalic.  Neck: Normal range of motion.  Respiratory: Effort normal.  Musculoskeletal: Normal range of motion.  Neurological: She is alert and oriented to person, place, and time.  Skin: Skin is warm and dry.  Psychiatric: Her speech is normal and behavior is normal. Her mood appears anxious. Her affect is labile. Thought content is delusional. Cognition and memory are normal. She expresses impulsivity.    Review of Systems  Constitutional: Negative.   HENT: Negative.   Eyes: Negative.   Respiratory: Negative.   Cardiovascular: Negative.   Gastrointestinal: Negative.   Genitourinary: Negative.   Musculoskeletal: Negative.   Skin: Negative.   Neurological: Negative.   Endo/Heme/Allergies: Negative.   Psychiatric/Behavioral: The patient is nervous/anxious.     Blood pressure 106/55, pulse (!) 20, temperature 97.5 F (36.4 C), temperature source Oral, resp. rate 18, SpO2 99 %.There is no height or weight on file to calculate BMI.  General Appearance: Casual  Eye Contact:  Fair  Speech:  Normal Rate  Volume:  Normal  Mood:  Anxious and Irritable  Affect:  Blunt  Thought Process:  Coherent and Descriptions of Associations: Tangential  Orientation:  Full (Time, Place, and Person)  Thought Content:  Illogical, Delusions and Tangential  Suicidal Thoughts:  No  Homicidal Thoughts:  No  Memory:  Immediate;   Fair Recent;   Fair Remote;   Fair  Judgement:  Impaired  Insight:  Lacking  Psychomotor Activity:  Normal  Concentration:  Concentration: Fair and Attention Span: Fair  Recall:  [employer] of Knowledge:  Fair   Language:  Good  Akathisia:  No  Handed:  Right  AIMS (if indicated):     Assets:  Leisure Time Physical Health Resilience  ADL's:  Intact  Cognition:  WNL  Sleep:        Treatment Plan Summary: Daily contact with patient to assess and evaluate symptoms and progress in treatment, Medication management and Plan schizoaffective disorder, bipolar type:  -Crisis stabilization -Medication management:  Continue Zyprexa 10 mg BID for psychosis. -Individual counseling  Disposition: Recommend psychiatric Inpatient admission when medically cleared.  Waylan Boga, NP 03/27/2016 11:38 AM

## 2016-03-27 NOTE — ED Notes (Signed)
Patient noted sleeping in room. No complaints, stable, in no acute distress. Q15 minute rounds and monitoring via Security Cameras to continue.  

## 2016-03-27 NOTE — ED Notes (Addendum)
Up to the bathroom to shower and change scrubs.  Pt remains irritable, belligerent, and angry.

## 2016-03-27 NOTE — ED Notes (Signed)
Up talking w/ other pt

## 2016-03-27 NOTE — ED Notes (Signed)
Up to the bathroom 

## 2016-03-27 NOTE — ED Notes (Addendum)
Up to the bathroom and to the desk for snack.  Continues to be irritable, curt answers or ignores questions.

## 2016-03-27 NOTE — ED Notes (Signed)
Injection given without undue problems. No hold required.

## 2016-03-27 NOTE — ED Notes (Signed)
Patient noted in day room. Stable, in no acute distress. Q15 minute rounds and monitoring via [employer] to continue.

## 2016-03-27 NOTE — ED Notes (Signed)
Patient noted in room. Stable, in no acute distress. Q15 minute rounds and monitoring via [employer] to continue.

## 2016-03-28 MED ORDER — STERILE WATER FOR INJECTION IJ SOLN
INTRAMUSCULAR | Status: AC
  Administered 2016-03-28: 12:00:00
  Filled 2016-03-28: qty 10

## 2016-03-28 MED ORDER — STERILE WATER FOR INJECTION IJ SOLN
INTRAMUSCULAR | Status: AC
  Administered 2016-03-28: 22:00:00
  Filled 2016-03-28: qty 10

## 2016-03-28 NOTE — BH Assessment (Signed)
Writer attempted to reassess pt this am. Pt lying down on bed. There is a sign pt made lying on her bedside table which says "LAWSUIT" in all capital letters and a smiling face beneath the word.  She refuses to look at Probation officer or respond verbally to writer's questions. Per chart review, pt slept well last night. TTS continues to seek inpatient placement for pt.

## 2016-03-28 NOTE — ED Notes (Signed)
Patient noted sleeping in room. No complaints, stable, in no acute distress. Q15 minute rounds and monitoring via Security Cameras to continue.  

## 2016-03-28 NOTE — ED Notes (Signed)
Pt is irritable and angry. This Probation officer offer Zyprexa po vs Zyprexa and pt would not take a pill. After giving her a shot (with a show of support present) this Probation officer asked pt if she wanted a Band-Aid. Pt said, "I want you to leave and Valerie Howard." Except for going to the restroom pt has been staying in bed.

## 2016-03-28 NOTE — ED Notes (Signed)
Patient reporting to undersigned that there is nothing wrong with her. When asking if she needs anything, she just responds "discharge". Reports that she did nothing to cause her to come to hospital. Argumentative about medications and making paranoid statements about [place] just wanting to give her medication for our own pleasure even though she doesn't need it. Offered PO medications and explained that the order is for either PO or IM. Patient took IM saying, "Just stick me in my ass, I know you probably have been looking at it all day" and went on to say that she wished she could pass gas. Patient angry and blames staff for being here.

## 2016-03-28 NOTE — BH Assessment (Signed)
[place] Assessment Progress Note  Per Corena Pilgrim, MD, this pt requires psychiatric hospitalization at this time.  The following facilities have been contacted to seek placement for this pt, with results as noted:  Beds available, information sent, decision pending:  [facility]   At capacity:  [facility]   [name], [place] Triage Specialist [phone]

## 2016-03-29 MED ORDER — STERILE WATER FOR INJECTION IJ SOLN
INTRAMUSCULAR | Status: AC
  Administered 2016-03-29: 11:00:00
  Filled 2016-03-29: qty 10

## 2016-03-29 MED ORDER — WHITE PETROLATUM GEL
Status: DC | PRN
  Administered 2016-03-29: 0.2 via TOPICAL
  Filled 2016-03-29: qty 28.35

## 2016-03-29 MED ORDER — STERILE WATER FOR INJECTION IJ SOLN
INTRAMUSCULAR | Status: AC
  Administered 2016-03-29: 2.1 mL
  Filled 2016-03-29: qty 10

## 2016-03-29 NOTE — BH Assessment (Signed)
[facility] Assessment Progress Note  Per Corena Pilgrim, MD, this pt still requires psychiatric hospitalization at this time.  The following facilities have been contacted to seek placement for this pt, with results as noted:  Beds available, information sent, decision pending:  [facility]   At capacity:  [place], [place] Triage Specialist [phone]

## 2016-03-29 NOTE — ED Notes (Signed)
Pt continues to be irritable and not cooperative with staff. She asked for things that she needs in an entitled style. She will not take her medications by mouth, and complains about getting the same medication IM, but does allow it. While the medication is given, she is discourteous.  She selects the site for the injection to be given. Show of force or support not required today.

## 2016-03-29 NOTE — Progress Notes (Signed)
ED CM spoke with pt in her room on 7/31/7 Pt responses to questions blunt and in angry tone. Pt informs Cm when offered uninsured [place] resources that she "do not want to be seen by any doctor in [place]", "on one in [place]" and she states "you guys make me come here" Pt not provided with any resources and would not provide CM with names of providers she has seen previously that she does not prefer to be seen by in the future

## 2016-03-29 NOTE — ED Notes (Signed)
Patient refused vitals.  Nurse notified.

## 2016-03-29 NOTE — Progress Notes (Signed)
Call received from [place] with [facility] who states patient declined due to acuity level.   Genice Rouge [id number] ED CSW 03/29/2016 6:13 PM

## 2016-03-29 NOTE — ED Notes (Signed)
Pt refused po meds, IM given.  Pt cursing at staff.

## 2016-03-29 NOTE — Progress Notes (Signed)
Call received from [place] with [facility] who states she was unable to review patient's information. Information was resent.   Genice Rouge [id number] ED CSW 03/29/2016 5:03 PM

## 2016-03-29 NOTE — ED Notes (Signed)
Pt awake, alert & responsive, no distress noted, labile, paranoid.  Monitoring for safety, Q 15 min checks in effect.

## 2016-03-29 NOTE — Progress Notes (Signed)
CSW spoke with Janett Billow with [facility] who states patient's information is still under review.    Genice Rouge [id number] ED CSW 03/29/2016 12:19 PM

## 2016-03-29 NOTE — Consult Note (Signed)
[facility] Psychiatry Consult   Reason for Consult:  Psychosis  Referring Physician:  EDP Patient Identification: Valerie Howard MRN:  [id number] Principal Diagnosis: Schizoaffective disorder, bipolar type: Diagnosis:   Patient Active Problem List   Diagnosis Date Noted  . Schizoaffective disorder, bipolar type ([place]) [F25.0]     Priority: High  . Severe manic bipolar I disorder with psychotic features ([place]) [F31.2] 09/26/2012    Priority: High  . Suicidal ideation [R45.851]   . Personality disorder [F60.9] 10/17/2012  . Anemia, iron deficiency [D50.9] 10/13/2012  . Tachycardia [R00.0] 10/01/2012  . Schizophrenia, paranoid type ([place]) [F20.0] 12/07/2011    Class: Acute    Total Time spent with patient: 30 minutes  Subjective:   Valerie Howard is a 40 y.o. female patient admitted with psychosis.  HPI:  40 yo female who was IVC'd by her caseworker for noncompliance, paranoia, auditory hallucinations, and jumping in front of moving cars.  Patient will not take medication voluntarily and has had to have antipsychotic injections.  She remains irritable, refusing medications, and labile.  Today, she told the MD, "I don't want to see you, I'm good.  When am I going to be released, just a game you are playing."  Past Psychiatric History: schizoaffective disorder  Risk to Self: Suicidal Ideation: No Suicidal Intent: No Is patient at risk for suicide?: No Suicidal Plan?: No Access to Means: No What has been your use of drugs/alcohol within the last 12 months?: Denies How many times?: 0 Other Self Harm Risks: Denies Triggers for Past Attempts: None known Intentional Self Injurious Behavior: None Risk to Others: Homicidal Ideation: No Thoughts of Harm to Others: No Current Homicidal Intent: No Current Homicidal Plan: No Access to Homicidal Means: No Identified Victim: Denies History of harm to others?: No Assessment of Violence: None Noted Violent Behavior Description:  Denies Does patient have access to weapons?: No Criminal Charges Pending?: No Does patient have a court date: No Prior Inpatient Therapy: Prior Inpatient Therapy: Yes Prior Therapy Dates: Multiple Prior Therapy Facilty/Provider(s): Multiple Reason for Treatment: Schizoaffective Disorder Prior Outpatient Therapy: Prior Outpatient Therapy: No Prior Therapy Dates: N/A Prior Therapy Facilty/Provider(s): N/A Reason for Treatment: N/A Does patient have an ACCT team?: No Does patient have Intensive In-House Services?  : No Does patient have [facility] services? : No Does patient have P4CC services?: No  Past Medical History:  Past Medical History:  Diagnosis Date  . Paranoid behavior ([place])   . Schizophrenia [facility])     Past Surgical History:  Procedure Laterality Date  . CESAREAN SECTION     Family History:  Family History  Problem Relation Age of Onset  . Hypertension Other   . Diabetes Other   . Cancer Other    Family Psychiatric  History: none Social History:  History  Alcohol Use No     History  Drug Use No    Social History   Social History  . Marital status: Legally Separated    Spouse name: N/A  . Number of children: N/A  . Years of education: N/A   Social History Main Topics  . Smoking status: Never Smoker  . Smokeless tobacco: Not on file  . Alcohol use No  . Drug use: No  . Sexual activity: No   Other Topics Concern  . Not on file   Social History Narrative  . No narrative on file   Additional Social History:    Allergies:   Allergies  Allergen Reactions  . Penicillins  Rash    Has patient had a PCN reaction causing immediate rash, facial/tongue/throat swelling, SOB or lightheadedness with hypotension:Patient refuses to answer (PRA) Has patient had a PCN reaction causing severe rash involving mucus membranes or skin necrosis:PRA Has patient had a PCN reaction that required hospitalization PRA Has patient had a PCN reaction occurring within the  last 10 years:PRA If all of the above answers are "NO", then may proceed with Cephalosporin use.     Labs:  No results found for this or any previous visit (from the past 48 hour(s)).  Current Facility-Administered Medications  Medication Dose Route Frequency Provider Last Rate Last Dose  . acetaminophen (TYLENOL) tablet 650 mg  650 mg Oral Q4H PRN [name], PA-C      . diphenhydrAMINE (BENADRYL) injection 50 mg  50 mg Intramuscular Once PRN Patrecia Pour, NP      . OLANZapine zydis (ZYPREXA) disintegrating tablet 10 mg  10 mg Oral BID Hampton Abbot, MD       Or  . OLANZapine (ZYPREXA) injection 10 mg  10 mg Intramuscular BID Hampton Abbot, MD   10 mg at 03/29/16 1030  . white petrolatum (VASELINE) gel   Topical PRN Corena Pilgrim, MD   0.2 application at [phone] [id number]   No current outpatient prescriptions on file.    Musculoskeletal: Strength & Muscle Tone: within normal limits Gait & Station: normal Patient leans: N/A  Psychiatric Specialty Exam: Physical Exam  Constitutional: She is oriented to person, place, and time. She appears well-developed and well-nourished.  HENT:  Head: Normocephalic.  Neck: Normal range of motion.  Respiratory: Effort normal.  Musculoskeletal: Normal range of motion.  Neurological: She is alert and oriented to person, place, and time.  Skin: Skin is warm and dry.  Psychiatric: Her speech is normal and behavior is normal. Her mood appears anxious. Her affect is labile. Thought content is delusional. Cognition and memory are normal. She expresses impulsivity.    Review of Systems  Constitutional: Negative.   HENT: Negative.   Eyes: Negative.   Respiratory: Negative.   Cardiovascular: Negative.   Gastrointestinal: Negative.   Genitourinary: Negative.   Musculoskeletal: Negative.   Skin: Negative.   Neurological: Negative.   Endo/Heme/Allergies: Negative.   Psychiatric/Behavioral: The patient is nervous/anxious.     Blood pressure  138/89, pulse 91, temperature 97.4 F (36.3 C), temperature source Oral, resp. rate 20, SpO2 99 %.There is no height or weight on file to calculate BMI.  General Appearance: Casual  Eye Contact:  Fair  Speech:  Normal Rate  Volume:  Normal  Mood:  Anxious and Irritable  Affect:  Blunt  Thought Process:  Coherent and Descriptions of Associations: Tangential  Orientation:  Full (Time, Place, and Person)  Thought Content:  Illogical, Delusions and Tangential  Suicidal Thoughts:  No  Homicidal Thoughts:  No  Memory:  Immediate;   Fair Recent;   Fair Remote;   Fair  Judgement:  Impaired  Insight:  Lacking  Psychomotor Activity:  Normal  Concentration:  Concentration: Fair and Attention Span: Fair  Recall:  [employer] of Knowledge:  Fair  Language:  Good  Akathisia:  No  Handed:  Right  AIMS (if indicated):     Assets:  Leisure Time Physical Health Resilience  ADL's:  Intact  Cognition:  WNL  Sleep:        Treatment Plan Summary: Daily contact with patient to assess and evaluate symptoms and progress in treatment, Medication management and Plan schizoaffective  disorder, bipolar type:  -Crisis stabilization -Medication management:  Continue Zyprexa 10 mg BID for psychosis. -Individual counseling  Disposition: Recommend psychiatric Inpatient admission when medically cleared.  Waylan Boga, NP 03/29/2016 11:41 AM  Patient seen face-to-face for psychiatric evaluation, chart reviewed and case discussed with the physician extender and developed treatment plan. Reviewed the information documented and agree with the treatment plan. Corena Pilgrim, MD

## 2016-03-30 MED ORDER — STERILE WATER FOR INJECTION IJ SOLN
INTRAMUSCULAR | Status: AC
  Filled 2016-03-30: qty 10

## 2016-03-30 MED ORDER — STERILE WATER FOR INJECTION IJ SOLN
INTRAMUSCULAR | Status: AC
  Administered 2016-03-30: 2.1 mL
  Filled 2016-03-30: qty 10

## 2016-03-30 NOTE — ED Notes (Signed)
Patient refused vitals.  Nurse notified.

## 2016-03-30 NOTE — BH Assessment (Signed)
[place] Assessment Progress Note  Per Corena Pilgrim, MD, this pt continues to require psychiatric hospitalization at this time.  At 11:28 Angela Nevin calls from [facility].  Pt has been accepted to their facility by Dr Jake Samples.  They will be ready to receive pt after 14:00.  Waylan Boga, DNP, concurs with this decision.  Pt's nurse, Jan, has been notified.  She agrees to call report to [phone].  Pt is under IVC and is to be transported via Event organ[facility].  [name], [place] Triage Specialist [phone]

## 2016-03-30 NOTE — ED Notes (Signed)
Pt slept most of the morning. She woke up irritable and refusing to take medication. Pt reluctantly let the MHT take her blood pressure. Pt allowed writer to give IM medication with show of force and no hands on. Pt is currently in the dayroom watching TV. Safety maintained on the unit.

## 2016-03-30 NOTE — BH Assessment (Signed)
[place] Assessment Progress Note This Probation officer spoke with patient this date to assess progress. Patient continues to be irritable and not cooperative with staff although seemed to be less agitated this date. Patient did respond to this writer's questions and seemed to process the content of questions asked. Patient was not oriented to time/place and answered it's "a week day somewhere in time." Patient stated she was "ready to go home" and wanted to get off "all these medications." Patient denied any S/I,H/I or AVH. Patient became agitated as this writer attempted to reframe certain questions associated with patient's progress. Patient asked writer to "plese exit her room." When ask if she had eaten breakfast she stated "don't worry about it."

## 2017-03-07 ENCOUNTER — Emergency Department (HOSPITAL_COMMUNITY)
Admission: EM | Admit: 2017-03-07 | Discharge: 2017-03-08 | Disposition: A | Discharge status: Other Acute Inpatient Hospital | Payer: Medicaid Other | Attending: Emergency Medicine | Admitting: Emergency Medicine

## 2017-03-07 ENCOUNTER — Encounter (HOSPITAL_COMMUNITY): Payer: Self-pay

## 2017-03-07 DIAGNOSIS — Z046 Encounter for general psychiatric examination, requested by authority: Secondary | ICD-10-CM | POA: Insufficient documentation

## 2017-03-07 DIAGNOSIS — F200 Paranoid schizophrenia: Secondary | ICD-10-CM | POA: Diagnosis present

## 2017-03-07 DIAGNOSIS — F250 Schizoaffective disorder, bipolar type: Secondary | ICD-10-CM | POA: Insufficient documentation

## 2017-03-07 DIAGNOSIS — Z9114 Patient's other noncompliance with medication regimen: Secondary | ICD-10-CM | POA: Insufficient documentation

## 2017-03-07 LAB — COMPREHENSIVE METABOLIC PANEL
ALT: 8 U/L — ABNORMAL LOW (ref 14–54)
ANION GAP: 9 (ref 5–15)
AST: 13 U/L — ABNORMAL LOW (ref 15–41)
Albumin: 4.5 g/dL (ref 3.5–5.0)
Alkaline Phosphatase: 58 U/L (ref 38–126)
BUN: 13 mg/dL (ref 6–20)
CHLORIDE: 109 mmol/L (ref 101–111)
CO2: 21 mmol/L — ABNORMAL LOW (ref 22–32)
Calcium: 9.2 mg/dL (ref 8.9–10.3)
Creatinine, Ser: 0.83 mg/dL (ref 0.44–1.00)
Glucose, Bld: 104 mg/dL — ABNORMAL HIGH (ref 65–99)
POTASSIUM: 3.5 mmol/L (ref 3.5–5.1)
Sodium: 139 mmol/L (ref 135–145)
Total Bilirubin: 0.4 mg/dL (ref 0.3–1.2)
Total Protein: 8.6 g/dL — ABNORMAL HIGH (ref 6.5–8.1)

## 2017-03-07 LAB — RAPID URINE DRUG SCREEN, HOSP PERFORMED
AMPHETAMINES: NOT DETECTED
BENZODIAZEPINES: NOT DETECTED
Barbiturates: NOT DETECTED
Cocaine: NOT DETECTED
OPIATES: NOT DETECTED
Tetrahydrocannabinol: NOT DETECTED

## 2017-03-07 LAB — CBC
HCT: 26.8 % — ABNORMAL LOW (ref 36.0–46.0)
Hemoglobin: 7.6 g/dL — ABNORMAL LOW (ref 12.0–15.0)
MCH: 18.8 pg — ABNORMAL LOW (ref 26.0–34.0)
MCHC: 28.4 g/dL — ABNORMAL LOW (ref 30.0–36.0)
MCV: 66.3 fL — ABNORMAL LOW (ref 78.0–100.0)
PLATELETS: 437 10*3/uL — AB (ref 150–400)
RBC: 4.04 MIL/uL (ref 3.87–5.11)
RDW: 20.2 % — ABNORMAL HIGH (ref 11.5–15.5)
WBC: 7 10*3/uL (ref 4.0–10.5)

## 2017-03-07 LAB — ETHANOL

## 2017-03-07 LAB — SALICYLATE LEVEL

## 2017-03-07 LAB — PREGNANCY, URINE: Preg Test, Ur: NEGATIVE

## 2017-03-07 LAB — ACETAMINOPHEN LEVEL: Acetaminophen (Tylenol), Serum: <10 ug/mL — ABNORMAL LOW (ref 10–30)

## 2017-03-07 MED ORDER — LORAZEPAM 1 MG PO TABS
1.0000 mg | ORAL_TABLET | ORAL | Status: DC | PRN
  Filled 2017-03-07: qty 1

## 2017-03-07 MED ORDER — LORAZEPAM 2 MG/ML IJ SOLN
2.0000 mg | INTRAMUSCULAR | Status: AC
  Administered 2017-03-07: 2 mg via INTRAMUSCULAR
  Filled 2017-03-07: qty 1

## 2017-03-07 MED ORDER — NICOTINE 21 MG/24HR TD PT24: 21.0000 mg | MEDICATED_PATCH | Freq: Every day | TRANSDERMAL | Status: DC

## 2017-03-07 MED ORDER — RISPERIDONE 1 MG PO TBDP
2.0000 mg | ORAL_TABLET | Freq: Three times a day (TID) | ORAL | Status: DC | PRN
  Filled 2017-03-07: qty 2

## 2017-03-07 MED ORDER — ZIPRASIDONE MESYLATE 20 MG IM SOLR: 20.0000 mg | INTRAMUSCULAR | Status: DC | PRN

## 2017-03-07 MED ORDER — ONDANSETRON HCL 4 MG PO TABS: 4.0000 mg | ORAL_TABLET | Freq: Three times a day (TID) | ORAL | Status: DC | PRN

## 2017-03-07 MED ORDER — FERROUS SULFATE 325 (65 FE) MG PO TABS
325.0000 mg | ORAL_TABLET | Freq: Every day | ORAL | Status: DC
  Filled 2017-03-07: qty 1

## 2017-03-07 MED ORDER — STERILE WATER FOR INJECTION IJ SOLN
INTRAMUSCULAR | Status: AC
Start: 2017-03-07 — End: 2017-03-07
  Administered 2017-03-07: 2 mL
  Filled 2017-03-07: qty 10

## 2017-03-07 MED ORDER — ZIPRASIDONE MESYLATE 20 MG IM SOLR
20.0000 mg | INTRAMUSCULAR | Status: AC
  Administered 2017-03-07: 20 mg via INTRAMUSCULAR
  Filled 2017-03-07: qty 20

## 2017-03-07 MED ORDER — ACETAMINOPHEN 325 MG PO TABS: 650.0000 mg | ORAL_TABLET | ORAL | Status: DC | PRN

## 2017-03-07 MED ORDER — ALUM & MAG HYDROXIDE-SIMETH 200-200-20 MG/5ML PO SUSP: 30.0000 mL | Freq: Four times a day (QID) | ORAL | Status: DC | PRN

## 2017-03-07 MED ORDER — DIPHENHYDRAMINE HCL 50 MG/ML IJ SOLN
50.0000 mg | INTRAMUSCULAR | Status: AC
  Administered 2017-03-07: 50 mg via INTRAMUSCULAR
  Filled 2017-03-07: qty 1

## 2017-03-07 NOTE — ED Notes (Addendum)
Patient arrived to the unit and immediately began to demand to leave and began to berate staff members and talk loudly as she walked down the hall. Patient yelled "I don't belong in this damned place! Get me the fuck outta this place! I am not crazy!". Patient disruptive on the unit, is continually attempting to engage staff members in an argument and is unable to be redirected at this time." Frederico Hamman PA notified of patient's behaviors; new orders received at this time.

## 2017-03-07 NOTE — BH Assessment (Signed)
Assessment Note   Valerie Howard is an 41 y.o. female who brought in by [employer] with IVC papers due to being non compliant with medications, having paranoid behavior, delusions and auditory/visual hallucinations. Pt was agitated and angry during assessment and states that she "does not need to be here and wants to go home". Pt did not want to answer questions but stated that she would if it could help her get discharged. Pt was tangential in speech and delusional. She pulled down her pants and said "see! Look at this!" She flashed her privates at the counselor twice but did not elaborate on what she was trying to show her. Pt states that there is "nothing wrong with her" and "she doesn't need medication." Pt has been admitted inpatient in the past for psychotic behavior and previously diagnosed with schizophrenia. Per the IVC papers she believes that people are watching her and she is having more erratic behavior and responding to internal stimuli. Pt denies SI, HI or substance use and UDS is negative. Pt was aggressive and stated that she was going to "cease conversation with counselor".   Disposition: pt is not medically clear. Pt to be reevaluated once medically clear for inpatient admission per Catalina Pizza DNP.    Diagnosis: Schizophrenia   Past Medical History:  Past Medical History:  Diagnosis Date  . Paranoid behavior ([place])   . Schizophrenia [facility])     Past Surgical History:  Procedure Laterality Date  . CESAREAN SECTION      Family History:  Family History  Problem Relation Age of Onset  . Hypertension Other   . Diabetes Other   . Cancer Other     Social History:  reports that she has never smoked. She has never used smokeless tobacco. She reports that she drinks alcohol. She reports that she does not use drugs.  Additional Social History:  Alcohol / Drug Use Pain Medications: Denies Prescriptions: Denies Over the Counter: Denies History of alcohol / drug use?: No history  of alcohol / drug abuse  CIWA: CIWA-Ar BP: (!) 153/95 Pulse Rate: 96 COWS:    PATIENT STRENGTHS: (choose at least two) Average or above average intelligence General fund of knowledge  Allergies:  Allergies  Allergen Reactions  . Penicillins Rash    Has patient had a PCN reaction causing immediate rash, facial/tongue/throat swelling, SOB or lightheadedness with hypotension:Patient refuses to answer (PRA) Has patient had a PCN reaction causing severe rash involving mucus membranes or skin necrosis:PRA Has patient had a PCN reaction that required hospitalization PRA Has patient had a PCN reaction occurring within the last 10 years:PRA If all of the above answers are "NO", then may proceed with Cephalosporin use.     Home Medications:  (Not in a hospital admission)  OB/GYN Status:  Patient's last menstrual period was 02/05/2017 (approximate).  General Assessment Data [facility] T[facility] Assessment: In system Is this a Tele or Face-to-Face Assessment?: Face-to-Face Is this an Initial Assessment or a Re-assessment for this encounter?: Initial Assessment Marital status: Divorced Is patient pregnant?: No Pregnancy Status: No Living Arrangements: Alone Can pt return to current living arrangement?: No Admission Status: Involuntary Is patient capable of signing voluntary admission?: No Referral Source: Self/Family/Friend Insurance type:  (None)     Crisis Care Plan Living Arrangements: Alone Name of Psychiatrist: unknown- pt refused to answer Name of Therapist: unknown- pt refused to answer  Education Status Is patient currently in school?: No  Risk to self with the  past 6 months Suicidal Ideation: No Has patient been a risk to self within the past 6 months prior to admission? : No Suicidal Intent: No Has patient had any suicidal intent within the past 6 months prior to admission? : No Is patient at risk for suicide?: No Suicidal Plan?: No Has patient  had any suicidal plan within the past 6 months prior to admission? : No Access to Means: No What has been your use of drugs/alcohol within the last 12 months?: pt denies use Substance abuse history and/or treatment for substance abuse?:  (Unknown) Suicide prevention information given to non-admitted patients: Not applicable  Risk to Others within the past 6 months Homicidal Ideation: No Does patient have any lifetime risk of violence toward others beyond the six months prior to admission? : Unknown Thoughts of Harm to Others: No Current Homicidal Intent: No Current Homicidal Plan: No Access to Homicidal Means: No Identified Victim: None History of harm to others?: No Assessment of Violence: None Noted Violent Behavior Description: none Does patient have access to weapons?: No Criminal Charges Pending?: No Does patient have a court date: No Is patient on probation?: No  Psychosis Hallucinations: None noted Delusions: Grandiose, Persecutory  Mental Status Report Appearance/Hygiene: Unremarkable Eye Contact: Good Motor Activity: Freedom of movement Speech: Tangential Level of Consciousness: Restless Mood: Anxious, Angry Affect: Angry Anxiety Level: Severe Thought Processes: Tangential Judgement: Impaired Orientation: Person, Place, Time, Situation Obsessive Compulsive Thoughts/Behaviors: Moderate  Cognitive Functioning Concentration: Decreased Memory: Recent Intact, Remote Intact IQ: Average Insight: Poor Impulse Control: Poor Appetite: Poor Weight Loss: 0 Weight Gain: 0 Sleep: Unable to Assess Vegetative Symptoms: None  ADLScreening [facility] Assessment Services) Patient's cognitive ability adequate to safely complete daily activities?: Yes Patient able to express need for assistance with ADLs?: Yes Independently performs ADLs?: Yes (appropriate for developmental age)  Prior Inpatient Therapy Prior Inpatient Therapy: Yes Prior Therapy Dates: multiple  Prior  Therapy Facilty/Provider(s): multiple Reason for Treatment: psychosis, schizoaffective disorder  Prior Outpatient Therapy Prior Outpatient Therapy: Yes Prior Therapy Dates: UTA Prior Therapy Facilty/Provider(s): UTA  Reason for Treatment: UTA Does patient have an ACCT team?: No Does patient have Intensive In-House Services?  : No Does patient have [facility] services? : Unknown Does patient have P4CC services?: No  ADL Screening (condition at time of admission) Patient's cognitive ability adequate to safely complete daily activities?: Yes Is the patient deaf or have difficulty hearing?: No Does the patient have difficulty seeing, even when wearing glasses/contacts?: No Does the patient have difficulty concentrating, remembering, or making decisions?: No Patient able to express need for assistance with ADLs?: Yes Does the patient have difficulty dressing or bathing?: No Independently performs ADLs?: Yes (appropriate for developmental age) Does the patient have difficulty walking or climbing stairs?: No Weakness of Legs: None Weakness of Arms/Hands: None  Home Assistive Devices/Equipment Home Assistive Devices/Equipment: None  Therapy Consults (therapy consults require a physician order) PT Evaluation Needed: No OT Evalulation Needed: No SLP Evaluation Needed: No Abuse/Neglect Assessment (Assessment to be complete while patient is alone) Physical Abuse:  (unknown) Verbal Abuse:  (unknown) Sexual Abuse:  (unknown) Exploitation of patient/patient's resources:  (unknown) Self-Neglect:  (unknown) Values / Beliefs Cultural Requests During Hospitalization: None Spiritual Requests During Hospitalization: None Consults Spiritual Care Consult Needed: No Social Work Consult Needed: No Regulatory affairs officer (For Healthcare) Does Patient Have a Medical Advance Directive?: No Would patient like information on creating a medical advance directive?: No - Patient declined Nutrition Screen- [facility]  Adult/[facility] Patient's home diet: Regular  Has the patient recently lost weight without trying?: No Has the patient been eating poorly because of a decreased appetite?: No Malnutrition Screening Tool Score: 0  Additional Information 1:1 In Past 12 Months?: No CIRT Risk: Yes Elopement Risk: Yes Does patient have medical clearance?: No     Disposition:  Disposition Initial Assessment Completed for this Encounter: Yes Disposition of Patient: Inpatient treatment program Type of inpatient treatment program:  (Inpatient ONCE MEDICALLY CLEAR)  [place] 03/07/2017 6:43 PM

## 2017-03-07 NOTE — ED Notes (Signed)
Bed: [facility] Expected date:  Expected time:  Means of arrival:  Comments: 

## 2017-03-07 NOTE — ED Notes (Signed)
Patient was offered something to drink and says she does not want anything. I offered her a nicotine patch and she says she does not smoke. Patient repeatedly said she does not want anything from me.

## 2017-03-07 NOTE — ED Notes (Signed)
Patient given medications as ordered; security in room to assist with medication administration. Patient arguing and angry during this time. Patient approaching nursing station within a few minutes after security left and demanded to have each staff member's full name and the home address of the CEO of the hospital. Staff unable to redirect patient at this time. No distress noted.

## 2017-03-07 NOTE — ED Triage Notes (Addendum)
Patient brought in by [employer]. Patient has IVC papers. IVC papers read that the patient  Was recently diagnosed with schizophrenia. Patient is non compliant with medications. Patient was ev[facility] from [facility] the [facility] due to increasingly erract behavior. Case Worker states that the patient is hearing, talking to people who are not there and believes people are watching her that are not there. Case worker concerned that the patient is regressing.

## 2017-03-07 NOTE — ED Provider Notes (Signed)
[facility] DEPT Provider Note   CSN: [id number] Arrival date & time: 03/07/17  1615     History   Chief Complaint Chief Complaint  Patient presents with  . IVC    HPI Valerie Howard is a 41 y.o. female.  HPI Valerie Howard is a 41 y.o. female with history of schizophrenia, manic behavior, anemia presents to emergency department under involuntary commitment brought in by police officers. Patient was involuntarily committed for being noncompliant with her psychiatric medications, being paranoid, hallucinations. Pt IVCed by Case worker who reported that pt recently evicted from her home at [employer] due to [place] behavior and that she has been talking to people who are not there, and believes people are watching her. Patient does not know why she is here and states that she does not belong here. She states that she has been to this facility multiple times and that it is "piece of shit." She denies any medical problems and states that she does not taking medications daily. She does not want to elaborate anymore on many questions asked and asking to be discharged.  Past Medical History:  Diagnosis Date  . Paranoid behavior ([place])   . Schizophrenia [facility])     Patient Active Problem List   Diagnosis Date Noted  . Schizoaffective disorder, bipolar type ([place])   . Suicidal ideation   . Personality disorder 10/17/2012  . Anemia, iron deficiency 10/13/2012  . Tachycardia 10/01/2012  . 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 Schizophrenia, paranoid type ([place]) 12/07/2011    Class: Acute    Past Surgical History:  Procedure Laterality Date  . CESAREAN SECTION      OB History    No data available       Home Medications    Prior to Admission medications   Not on File    Family History Family History  Problem Relation Age of Onset  . Hypertension Other   . Diabetes Other   . Cancer Other     Social History Social History    Substance Use Topics  . Smoking status: Never Smoker  . Smokeless tobacco: Never Used  . Alcohol use Yes     Comment: socially     Allergies   Penicillins   Review of Systems Review of Systems  Unable to perform ROS: Psychiatric disorder     Physical Exam Updated Vital Signs BP (!) 144/104 (BP Location: Right Arm)   Pulse (!) 129   Temp 98.7 F (37.1 C) (Oral)   Resp 18   Ht 5\' 7"  (1.702 m)   Wt 80.5 kg (177 lb 6 oz)   LMP 02/05/2017 (Approximate)   SpO2 99%   BMI 27.78 kg/m   Physical Exam  Constitutional: She appears well-developed and well-nourished. No distress.  Eyes: Conjunctivae and EOM are normal. Pupils are equal, round, and reactive to light.  Neck: Neck supple.  Cardiovascular: Normal rate and normal heart sounds.   Pulmonary/Chest: Effort normal and breath sounds normal. No respiratory distress.  Neurological: She is alert.  Skin: Skin is warm and dry.  Nursing note and vitals reviewed.    ED Treatments / Results  Labs (all labs ordered are listed, but only abnormal results are displayed) Labs Reviewed  RAPID URINE DRUG SCREEN, HOSP PERFORMED  COMPREHENSIVE METABOLIC PANEL  ETHANOL  SALICYLATE LEVEL  ACETAMINOPHEN LEVEL  CBC  PREGNANCY, URINE    EKG  EKG Interpretation None  Radiology No results found.  Procedures Procedures (including critical care time)  Medications Ordered in ED Medications  risperiDONE (RISPERDAL M-TABS) disintegrating tablet 2 mg (not administered)    And  LORazepam (ATIVAN) tablet 1 mg (not administered)    And  ziprasidone (GEODON) injection 20 mg (not administered)  acetaminophen (TYLENOL) tablet 650 mg (not administered)  ondansetron (ZOFRAN) tablet 4 mg (not administered)  alum & mag hydroxide-simeth (MAALOX/MYLANTA) 200-200-20 MG/5ML suspension 30 mL (not administered)  nicotine (NICODERM CQ - dosed in mg/24 hours) patch 21 mg (not administered)     Initial Impression / Assessment and  Plan / ED Course  I have reviewed the triage vital signs and the nursing notes.  Pertinent labs & imaging results that were available during my care of the patient were reviewed by me and considered in my medical decision making (see chart for details).     Patient in emergency department under involuntary commitment, committed by case worker for hallucinations, paranoid behavior. They should noncompliant with her medications. History of schizophrenia and psychosis. Patient does not believe she needs to be here and wants to discharge home. We will check a medical clearance labs and get TTS to assess patient. Patient is tachycardic, but appears to be agitated. Will reassess her heart rate. Patient did not let me examine her, stating that "he will not touch me." She did however pull her close off in front of me so I can evaluate her skin. No skin lesions, sores, abnormality seen.    6:20 PM Pt's Hgb 7.6. She has hx of anemia and reports heavy vaginal bleeding. She is not currently on her cycle. Her HR did come down when rechecked in 90s. Question chronic anemia. She is asymptomatic for it. Will start on iron. Will need outpatient follow up.  Pt medically cleared  Vitals:   03/07/17 1626 03/07/17 1629 03/07/17 1816 03/07/17 1831  BP: (!) 144/104   (!) 153/95  Pulse: (!) 129  (!) 101 96  Resp: 18   17  Temp: 98.7 F (37.1 C)     TempSrc: Oral     SpO2: 99%  100% 100%  Weight:  80.5 kg (177 lb 6 oz)    Height:  5\' 7"  (1.702 m)      Final Clinical Impressions(s) / ED Diagnoses   Final diagnoses:  Psychosis, unspecified psychosis type    New Prescriptions New Prescriptions   No medications on file     Jeannett Senior, PA-C 03/08/17 0104    Julianne Rice, MD 03/08/17 1531

## 2017-03-07 NOTE — ED Notes (Signed)
Bed: [facility]PT2 Expected date:  Expected time:  Means of arrival:  Comments: 

## 2017-03-08 ENCOUNTER — Inpatient Hospital Stay (HOSPITAL_COMMUNITY)
Admission: AD | Admit: 2017-03-08 | Discharge: 2017-03-21 | DRG: 885 | Disposition: A | Discharge status: Home / Self Care | Payer: Medicaid Other | Attending: Psychiatry | Admitting: Psychiatry

## 2017-03-08 ENCOUNTER — Encounter (HOSPITAL_COMMUNITY): Payer: Self-pay | Admitting: *Deleted

## 2017-03-08 DIAGNOSIS — F250 Schizoaffective disorder, bipolar type: Secondary | ICD-10-CM | POA: Diagnosis not present

## 2017-03-08 DIAGNOSIS — R443 Hallucinations, unspecified: Secondary | ICD-10-CM | POA: Diagnosis not present

## 2017-03-08 DIAGNOSIS — G4700 Insomnia, unspecified: Secondary | ICD-10-CM | POA: Diagnosis present

## 2017-03-08 DIAGNOSIS — F609 Personality disorder, unspecified: Secondary | ICD-10-CM | POA: Diagnosis present

## 2017-03-08 DIAGNOSIS — F312 Bipolar disorder, current episode manic severe with psychotic features: Secondary | ICD-10-CM

## 2017-03-08 DIAGNOSIS — D509 Iron deficiency anemia, unspecified: Secondary | ICD-10-CM | POA: Diagnosis present

## 2017-03-08 DIAGNOSIS — Z9114 Patient's other noncompliance with medication regimen: Secondary | ICD-10-CM

## 2017-03-08 DIAGNOSIS — F419 Anxiety disorder, unspecified: Secondary | ICD-10-CM | POA: Diagnosis present

## 2017-03-08 DIAGNOSIS — Z9119 Patient's noncompliance with other medical treatment and regimen: Secondary | ICD-10-CM

## 2017-03-08 DIAGNOSIS — N920 Excessive and frequent menstruation with regular cycle: Secondary | ICD-10-CM | POA: Diagnosis present

## 2017-03-08 DIAGNOSIS — Z79899 Other long term (current) drug therapy: Secondary | ICD-10-CM

## 2017-03-08 DIAGNOSIS — R45851 Suicidal ideations: Secondary | ICD-10-CM | POA: Diagnosis present

## 2017-03-08 DIAGNOSIS — F22 Delusional disorders: Secondary | ICD-10-CM | POA: Diagnosis present

## 2017-03-08 DIAGNOSIS — I10 Essential (primary) hypertension: Secondary | ICD-10-CM | POA: Diagnosis present

## 2017-03-08 DIAGNOSIS — N921 Excessive and frequent menstruation with irregular cycle: Secondary | ICD-10-CM | POA: Diagnosis present

## 2017-03-08 MED ORDER — OLANZAPINE 10 MG PO TABS
10.0000 mg | ORAL_TABLET | Freq: Two times a day (BID) | ORAL | Status: DC
  Filled 2017-03-08: qty 1

## 2017-03-08 MED ORDER — OLANZAPINE 10 MG PO TABS
10.0000 mg | ORAL_TABLET | Freq: Two times a day (BID) | ORAL | Status: DC
  Filled 2017-03-08 (×5): qty 1

## 2017-03-08 MED ORDER — ONDANSETRON HCL 4 MG PO TABS: 4.0000 mg | ORAL_TABLET | Freq: Three times a day (TID) | ORAL | Status: DC | PRN

## 2017-03-08 MED ORDER — ALUM & MAG HYDROXIDE-SIMETH 200-200-20 MG/5ML PO SUSP: 30.0000 mL | Freq: Four times a day (QID) | ORAL | Status: DC | PRN

## 2017-03-08 MED ORDER — FERROUS SULFATE 325 (65 FE) MG PO TABS
325.0000 mg | ORAL_TABLET | Freq: Every day | ORAL | Status: DC
  Filled 2017-03-08 (×2): qty 1

## 2017-03-08 MED ORDER — GABAPENTIN 300 MG PO CAPS
300.0000 mg | ORAL_CAPSULE | Freq: Two times a day (BID) | ORAL | Status: DC
  Administered 2017-03-10: 300 mg via ORAL
  Filled 2017-03-08 (×31): qty 1

## 2017-03-08 MED ORDER — ACETAMINOPHEN 325 MG PO TABS: 650.0000 mg | ORAL_TABLET | ORAL | Status: DC | PRN

## 2017-03-08 MED ORDER — GABAPENTIN 300 MG PO CAPS
300.0000 mg | ORAL_CAPSULE | Freq: Two times a day (BID) | ORAL | Status: DC
Start: 2017-03-08 — End: 2017-03-08
  Filled 2017-03-08: qty 1

## 2017-03-08 MED ORDER — NICOTINE 21 MG/24HR TD PT24
21.0000 mg | MEDICATED_PATCH | Freq: Every day | TRANSDERMAL | Status: DC
  Filled 2017-03-08 (×16): qty 1

## 2017-03-08 MED ORDER — MAGNESIUM HYDROXIDE 400 MG/5ML PO SUSP: 30.0000 mL | Freq: Every day | ORAL | Status: DC | PRN

## 2017-03-08 NOTE — ED Notes (Signed)
Pt transported to [facility] by [employer]. All belongings returned to pt who signed for same. Pt was irritable, but cooperative with police.

## 2017-03-08 NOTE — Tx Team (Signed)
Initial Treatment Plan 03/08/2017 2:54 PM Valerie Howard VIF:[id number]    PATIENT STRESSORS: Medication change or noncompliance   PATIENT STRENGTHS: Average or above average intelligence Capable of independent living Physical Health   PATIENT IDENTIFIED PROBLEMS: Psychosis "I don't need to be here, there's nothing wrong with me"                     DISCHARGE CRITERIA:  Ability to meet basic life and health needs Improved stabilization in mood, thinking, and/or behavior Verbal commitment to aftercare and medication compliance  PRELIMINARY DISCHARGE PLAN: Attend aftercare/continuing care group Return to previous living arrangement  PATIENT/FAMILY INVOLVEMENT: This treatment plan has been presented to and reviewed with the patient, Valerie Howard, and/or family member, .  The patient and family have been given the opportunity to ask questions and make suggestions.  [place], [place], [place] 03/08/2017, 2:54 PM

## 2017-03-08 NOTE — ED Notes (Signed)
Patient out of bed, went to the bathroom and began to take a shower. Patient instructed to get out of shower at this time due to medication and safety. Patient delayed and again turned on shower. This Probation officer again instructed patient to dry off, get dressed and return to her room. Patient difficult to redirect.

## 2017-03-08 NOTE — BH Assessment (Signed)
[place] Assessment Progress Note  Per Corena Pilgrim, MD, this pt requires psychiatric hospitalization.  Letitia Libra, RN, [facility] has assigned pt to [facility] Rm [id number]; they will be ready to receive pt at 14:00.  Pt presents under IVC initiated by [facility] staff, which Dr Darleene Cleaver has upheld, and IVC documents have been faxed to [facility].  Pt's nurse, Diane, has been notified, and agrees to call report to [phone].  Pt is to be transported via Event organ[facility].   [name], [place] Triage Specialist [phone]

## 2017-03-08 NOTE — Progress Notes (Addendum)
Pt alert and visible in milieu during shift. Presents irritable, paranoid and labile on approach with pressures speech. Observed moving away from staff / peers, don't want to be touched during vitals, declined fluids when offered initially, "he touched it, I don't want it, he had it already". Per pt "I'm not crazy, I'm just trying to track a package like a normal person would". Refused scheduled evening medications when offered "I don't need that shit, I'm not fucking crazy, I don't take no medications". Shower supplies offered to pt as per her request. Pt tolerated meals and fluids well. Routine safety checks maintained.

## 2017-03-08 NOTE — ED Notes (Signed)
Introduced self to patient. Pt oriented to unit expectations.  Assessed pt for:  A) Anxiety &/or agitation: Pt is irritable and angry appearing, but stays in her room this morning except for using the telephone. She appeared to be appropriate on the telephone. She told the doctors that she is not going to take medication.  S) Safety: Safety maintained with q-15-minute checks and hourly rounds by staff.  A) ADLs: Pt able to perform ADLs independently.  P) Pick-Up (room cleanliness): Pt's room clean and free of clutter.

## 2017-03-08 NOTE — Consult Note (Signed)
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 Schizophrenia, paranoid type (HCC) [F20.0] 12/07/2011    Priority: High    Class: Acute  . Schizoaffective disorder, bipolar type (HCC) [F25.0]   . Suicidal ideation [R45.851]   . Personality disorder [F60.9] 10/17/2012  . Anemia, iron deficiency [D50.9] 10/13/2012  . Tachycardia [R00.0] 10/01/2012    Total Time spent with patient: 45 minutes  Subjective:   Kayren L Muto is a 41 y.o. female patient admitted with worsening paranoia and psychosis.  HPI: Patient with history of Schizoaffective disorder-Bipolar type, Paranoid Schizophrenia who was IVC'd due to non-compliant with medications, worsening agitation, erratic behavior and anger outburst. Patient has been hearing voices, talking to herself as if responding to internal stimuli and believes that people are watching and out to get her. Today, patient refused her medications and became belligerent when she was asked to take her medications.  Past Psychiatric History: as above  Risk to Self: Suicidal Ideation: No Suicidal Intent: No Is patient at risk for suicide?: No Suicidal Plan?: No Access to Means: No What has been your use of drugs/alcohol within the last 12 months?: pt denies use Risk to Others: Homicidal Ideation: No Thoughts of Harm to Others: No Current Homicidal Intent: No Current Homicidal Plan: No Access to Homicidal Means: No Identified Victim: None History of harm to others?: No Assessment of Violence: None Noted Violent Behavior Description: none Does patient have access to weapons?:  No Criminal Charges Pending?: No Does patient have a court date: No Prior Inpatient Therapy: Prior Inpatient Therapy: Yes Prior Therapy Dates: multiple  Prior Therapy Facilty/Provider(s): multiple Reason for Treatment: psychosis, schizoaffective disorder Prior Outpatient Therapy: Prior Outpatient Therapy: Yes Prior Therapy Dates: UTA Prior Therapy Facilty/Provider(s): UTA  Reason for Treatment: UTA Does patient have an ACCT team?: No Does patient have Intensive In-House Services?  : No Does patient have [facility] services? : Unknown Does patient have P4CC services?: No  Past Medical History:  Past Medical History:  Diagnosis Date  . Paranoid behavior (HCC)   . Schizophrenia (HCC)     Past Surgical History:  Procedure Laterality Date  . CESAREAN SECTION     Family History:  Family History  Problem Relation Age of Onset  . Hypertension Other   . Diabetes Other   . Cancer Other    Family Psychiatric  History:  Social History:  History  Alcohol Use  . Yes    Comment: socially     History  Drug Use No    Social History   Social History  . Marital status: Legally Separated    Spouse name: N/A  . Number of children: N/A  . Years of education: N/A   Social History Main Topics  . Smoking status: Never Smoker  . Smokeless tobacco: Never Used  . Alcohol use Yes     Comment: socially  . Drug use: No  . Sexual activity: No   Other Topics Concern  . None   Social History Narrative  . None   Additional Social History:    Allergies:   Allergies  Allergen Reactions  . Penicillins Rash    Has patient had   a PCN reaction causing immediate rash, facial/tongue/throat swelling, SOB or lightheadedness with hypotension:Patient refuses to answer (PRA) Has patient had a PCN reaction causing severe rash involving mucus membranes or skin necrosis:PRA Has patient had a PCN reaction that required hospitalization PRA Has patient had a PCN reaction occurring within the last 10  years:PRA If all of the above answers are "NO", then may proceed with Cephalosporin use.     Labs:  Results for orders placed or performed during the hospital encounter of 03/07/17 (from the past 48 hour(s))  Rapid urine drug screen (hospital performed)     Status: None   Collection Time: 03/07/17  4:36 PM  Result Value Ref Range   Opiates NONE DETECTED NONE DETECTED   Cocaine NONE DETECTED NONE DETECTED   Benzodiazepines NONE DETECTED NONE DETECTED   Amphetamines NONE DETECTED NONE DETECTED   Tetrahydrocannabinol NONE DETECTED NONE DETECTED   Barbiturates NONE DETECTED NONE DETECTED    Comment:        DRUG SCREEN FOR MEDICAL PURPOSES ONLY.  IF CONFIRMATION IS NEEDED FOR ANY PURPOSE, NOTIFY LAB WITHIN 5 DAYS.        LOWEST DETECTABLE LIMITS FOR URINE DRUG SCREEN Drug Class       Cutoff (ng/mL) Amphetamine      1000 Barbiturate      200 Benzodiazepine   200 Tricyclics       300 Opiates          300 Cocaine          300 THC              50   Pregnancy, urine     Status: None   Collection Time: 03/07/17  4:36 PM  Result Value Ref Range   Preg Test, Ur NEGATIVE NEGATIVE    Comment:        THE SENSITIVITY OF THIS METHODOLOGY IS >20 mIU/mL.   Comprehensive metabolic panel     Status: Abnormal   Collection Time: 03/07/17  5:33 PM  Result Value Ref Range   Sodium 139 135 - 145 mmol/L   Potassium 3.5 3.5 - 5.1 mmol/L   Chloride 109 101 - 111 mmol/L   CO2 21 (L) 22 - 32 mmol/L   Glucose, Bld 104 (H) 65 - 99 mg/dL   BUN 13 6 - 20 mg/dL   Creatinine, Ser 0.83 0.44 - 1.00 mg/dL   Calcium 9.2 8.9 - 10.3 mg/dL   Total Protein 8.6 (H) 6.5 - 8.1 g/dL   Albumin 4.5 3.5 - 5.0 g/dL   AST 13 (L) 15 - 41 U/L   ALT 8 (L) 14 - 54 U/L   Alkaline Phosphatase 58 38 - 126 U/L   Total Bilirubin 0.4 0.3 - 1.2 mg/dL   GFR calc non Af Amer >60 >60 mL/min   GFR calc Af Amer >60 >60 mL/min    Comment: (NOTE) The eGFR has been calculated using the CKD EPI equation. This calculation has not  been validated in all clinical situations. eGFR's persistently <60 mL/min signify possible Chronic Kidney Disease.    Anion gap 9 5 - 15  Ethanol     Status: None   Collection Time: 03/07/17  5:33 PM  Result Value Ref Range   Alcohol, Ethyl (B) <5 <5 mg/dL    Comment:        LOWEST DETECTABLE LIMIT FOR SERUM ALCOHOL IS 5 mg/dL FOR MEDICAL PURPOSES ONLY   Salicylate level     Status: None     Collection Time: 03/07/17  5:33 PM  Result Value Ref Range   Salicylate Lvl <7.0 2.8 - 30.0 mg/dL  Acetaminophen level     Status: Abnormal   Collection Time: 03/07/17  5:33 PM  Result Value Ref Range   Acetaminophen (Tylenol), Serum <10 (L) 10 - 30 ug/mL    Comment:        THERAPEUTIC CONCENTRATIONS VARY SIGNIFICANTLY. A RANGE OF 10-30 ug/mL MAY BE AN EFFECTIVE CONCENTRATION FOR MANY PATIENTS. HOWEVER, SOME ARE BEST TREATED AT CONCENTRATIONS OUTSIDE THIS RANGE. ACETAMINOPHEN CONCENTRATIONS >150 ug/mL AT 4 HOURS AFTER INGESTION AND >50 ug/mL AT 12 HOURS AFTER INGESTION ARE OFTEN ASSOCIATED WITH TOXIC REACTIONS.   cbc     Status: Abnormal   Collection Time: 03/07/17  5:33 PM  Result Value Ref Range   WBC 7.0 4.0 - 10.5 K/uL   RBC 4.04 3.87 - 5.11 MIL/uL   Hemoglobin 7.6 (L) 12.0 - 15.0 g/dL   HCT 26.8 (L) 36.0 - 46.0 %   MCV 66.3 (L) 78.0 - 100.0 fL   MCH 18.8 (L) 26.0 - 34.0 pg   MCHC 28.4 (L) 30.0 - 36.0 g/dL   RDW 20.2 (H) 11.5 - 15.5 %   Platelets 437 (H) 150 - 400 K/uL    Current Facility-Administered Medications  Medication Dose Route Frequency Provider Last Rate Last Dose  . acetaminophen (TYLENOL) tablet 650 mg  650 mg Oral Q4H PRN Kirichenko, Tatyana, PA-C      . alum & mag hydroxide-simeth (MAALOX/MYLANTA) 200-200-20 MG/5ML suspension 30 mL  30 mL Oral Q6H PRN Kirichenko, Tatyana, PA-C      . ferrous sulfate tablet 325 mg  325 mg Oral Q breakfast Kirichenko, Tatyana, PA-C      . gabapentin (NEURONTIN) capsule 300 mg  300 mg Oral BID , , MD      .  risperiDONE (RISPERDAL M-TABS) disintegrating tablet 2 mg  2 mg Oral Q8H PRN Kirichenko, Tatyana, PA-C       And  . LORazepam (ATIVAN) tablet 1 mg  1 mg Oral PRN Kirichenko, Tatyana, PA-C       And  . ziprasidone (GEODON) injection 20 mg  20 mg Intramuscular PRN Kirichenko, Tatyana, PA-C      . nicotine (NICODERM CQ - dosed in mg/24 hours) patch 21 mg  21 mg Transdermal Daily Kirichenko, Tatyana, PA-C      . OLANZapine (ZYPREXA) tablet 10 mg  10 mg Oral BID , , MD      . ondansetron (ZOFRAN) tablet 4 mg  4 mg Oral Q8H PRN Kirichenko, Tatyana, PA-C       Current Outpatient Prescriptions  Medication Sig Dispense Refill  . OLANZapine (ZYPREXA) 10 MG tablet Take 10 mg by mouth 2 (two) times daily.      Musculoskeletal: Strength & Muscle Tone: within normal limits Gait & Station: normal Patient leans: N/A  Psychiatric Specialty Exam: Physical Exam  Psychiatric: Her affect is angry and labile. Her speech is rapid and/or pressured. She is agitated, aggressive and actively hallucinating. Thought content is paranoid and delusional. Cognition and memory are normal. She expresses impulsivity.    Review of Systems  Constitutional: Negative.   HENT: Negative.   Eyes: Negative.   Respiratory: Negative.   Cardiovascular: Negative.   Gastrointestinal: Negative.   Genitourinary: Negative.   Musculoskeletal: Negative.   Skin: Negative.   Neurological: Negative.   Endo/Heme/Allergies: Negative.   Psychiatric/Behavioral: Positive for hallucinations. The patient has insomnia.     Blood pressure (!) 153/95,   pulse 96, temperature 98.7 F (37.1 C), temperature source Oral, resp. rate 17, height 5' 7" (1.702 m), weight 80.5 kg (177 lb 6 oz), last menstrual period 02/05/2017, SpO2 100 %.Body mass index is 27.78 kg/m.  General Appearance: Casual  Eye Contact:  Minimal  Speech:  Pressured  Volume:  Increased  Mood:  Angry and Irritable  Affect:  Labile  Thought Process:  Disorganized   Orientation:  Full (Time, Place, and Person)  Thought Content:  Illogical, Delusions and Hallucinations: Auditory  Suicidal Thoughts:  No  Homicidal Thoughts:  No  Memory:  Immediate;   Fair Recent;   Fair Remote;   Fair  Judgement:  Poor  Insight:  Lacking  Psychomotor Activity:  Increased  Concentration:  Concentration: Fair and Attention Span: Fair  Recall:  Fair  Fund of Knowledge:  Fair  Language:  Good  Akathisia:  No  Handed:  Right  AIMS (if indicated):     Assets:  Communication Skills Social Support  ADL's:  Intact  Cognition:  WNL  Sleep:   poor     Treatment Plan Summary: Daily contact with patient to assess and evaluate symptoms and progress in treatment and Medication management Start Zyprexa 10 mg bid for psychosis/delusions and Gabapentin 300 mg bid for aggression   Disposition: Recommend psychiatric Inpatient admission when medically cleared.  , , MD 03/08/2017 10:41 AM 

## 2017-03-08 NOTE — Progress Notes (Signed)
41 year old female pt admitted on involuntary basis. On admission, Valerie Howard refused to sign any paper work and spoke about how she does not need to be here, there was nothing wrong with her and that she does not need any medications. She did answer some questions in regards to her admission process, denied any pain or SI. She was oriented to her room and safety maintained.

## 2017-03-09 MED ORDER — FERROUS SULFATE 325 (65 FE) MG PO TABS
325.0000 mg | ORAL_TABLET | Freq: Two times a day (BID) | ORAL | Status: DC
  Administered 2017-03-10: 325 mg via ORAL
  Filled 2017-03-09 (×6): qty 1

## 2017-03-09 MED ORDER — HALOPERIDOL 5 MG PO TABS
5.0000 mg | ORAL_TABLET | Freq: Four times a day (QID) | ORAL | Status: DC | PRN
  Administered 2017-03-09: 5 mg via ORAL
  Filled 2017-03-09: qty 1

## 2017-03-09 MED ORDER — LORAZEPAM 1 MG PO TABS
1.0000 mg | ORAL_TABLET | Freq: Four times a day (QID) | ORAL | Status: DC | PRN
  Administered 2017-03-09: 1 mg via ORAL
  Filled 2017-03-09 (×2): qty 1

## 2017-03-09 MED ORDER — BENZTROPINE MESYLATE 0.5 MG PO TABS
0.5000 mg | ORAL_TABLET | Freq: Two times a day (BID) | ORAL | Status: DC
  Filled 2017-03-09 (×2): qty 1

## 2017-03-09 MED ORDER — LORAZEPAM 2 MG/ML IJ SOLN
2.0000 mg | Freq: Two times a day (BID) | INTRAMUSCULAR | Status: DC
  Filled 2017-03-09 (×2): qty 1

## 2017-03-09 MED ORDER — HALOPERIDOL LACTATE 5 MG/ML IJ SOLN
5.0000 mg | Freq: Two times a day (BID) | INTRAMUSCULAR | Status: DC
  Filled 2017-03-09 (×12): qty 1

## 2017-03-09 MED ORDER — HALOPERIDOL LACTATE 5 MG/ML IJ SOLN
5.0000 mg | Freq: Four times a day (QID) | INTRAMUSCULAR | Status: DC | PRN
  Filled 2017-03-09: qty 1

## 2017-03-09 MED ORDER — BENZTROPINE MESYLATE 1 MG PO TABS
1.0000 mg | ORAL_TABLET | Freq: Two times a day (BID) | ORAL | Status: DC
  Filled 2017-03-09 (×2): qty 1

## 2017-03-09 MED ORDER — LISINOPRIL 20 MG PO TABS
20.0000 mg | ORAL_TABLET | Freq: Once | ORAL | Status: DC
  Filled 2017-03-09: qty 1

## 2017-03-09 MED ORDER — DOCUSATE SODIUM 100 MG PO CAPS
100.0000 mg | ORAL_CAPSULE | Freq: Two times a day (BID) | ORAL | Status: DC
  Administered 2017-03-10: 100 mg via ORAL
  Filled 2017-03-09 (×28): qty 1

## 2017-03-09 MED ORDER — BENZTROPINE MESYLATE 1 MG/ML IJ SOLN
0.5000 mg | Freq: Two times a day (BID) | INTRAMUSCULAR | Status: DC
  Filled 2017-03-09 (×7): qty 0.5
  Filled 2017-03-09: qty 2
  Filled 2017-03-09 (×21): qty 0.5

## 2017-03-09 MED ORDER — LORAZEPAM 2 MG/ML IJ SOLN: 1.0000 mg | Freq: Four times a day (QID) | INTRAMUSCULAR | Status: DC | PRN

## 2017-03-09 MED ORDER — HALOPERIDOL 5 MG PO TABS
5.0000 mg | ORAL_TABLET | Freq: Two times a day (BID) | ORAL | Status: DC
  Administered 2017-03-09 – 2017-03-14 (×8): 5 mg via ORAL
  Filled 2017-03-09 (×13): qty 1

## 2017-03-09 MED ORDER — LORAZEPAM 1 MG PO TABS
2.0000 mg | ORAL_TABLET | Freq: Two times a day (BID) | ORAL | Status: DC
  Administered 2017-03-09 – 2017-03-21 (×21): 2 mg via ORAL
  Filled 2017-03-09 (×22): qty 2

## 2017-03-09 MED ORDER — LISINOPRIL 10 MG PO TABS
10.0000 mg | ORAL_TABLET | Freq: Every day | ORAL | Status: DC
  Administered 2017-03-10 – 2017-03-21 (×5): 10 mg via ORAL
  Filled 2017-03-09 (×14): qty 1

## 2017-03-09 MED ORDER — BENZTROPINE MESYLATE 0.5 MG PO TABS
0.5000 mg | ORAL_TABLET | Freq: Two times a day (BID) | ORAL | Status: DC
  Administered 2017-03-09 – 2017-03-21 (×22): 0.5 mg via ORAL
  Filled 2017-03-09 (×30): qty 1
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
## 2017-03-09 NOTE — Progress Notes (Signed)
D- Patient is irritable and labile this shift with aggressive speech.  Patient frequently visited the nurses station to request that her vital signs be taken.  Patient's vital signs were taken twice this shift on patient's request.  Vitals showed elevated B/P and temperature. (See Flowsheet). Patient verbalized that she was not symptomatic and refused all medications to include tx for elevated b/p and temperature. Vitals were reported to PA, S. Simon.  Patient became increasingly agitated each time vital signs were taken.  Patient refused to speak with RN and refused to answer questions regarding SI/HI/AVH/ and pain. Patient appeared in no distress.     A- Routine safety checks conducted every 15 minutes.  Patient informed to notify staff with problems or concerns. R- Patient remains safe at this time.

## 2017-03-09 NOTE — Progress Notes (Signed)
Recreation Therapy Notes  Approximately 9:24am Recreation Therapy Intern introduced herself to the patient in an attempt to assess pt and pt responded with, "I don't need your help, have a nice day." Pt declined assessment. Recreation Therapy Intern will attempt to assess patient again tomorrow.  Valerie Howard, Recreation Therapy Intern   Victorino Sparrow, LRT/CTRS        Valerie Howard 03/09/2017 9:44 AM

## 2017-03-09 NOTE — Progress Notes (Signed)
[facility] Second Physician Opinion Progress Note for Medication Administration to Non-consenting Patients (For Involuntarily Committed Patients)  Patient: Valerie Howard Date of Birth: [birth year] MRN: [id number]  Reason for the Medication: The patient, without the benefit of the specific treatment measure, is incapable of participating in any available treatment plan that will give the patient a realistic opportunity of improving the patient's condition. There is, without the benefit of the specific treatment measure, a significant possibility that the patient will harm self or others before improvement of the patient's condition is realized.  Consideration of Side Effects: Consideration of the side effects related to the medication plan has been given.  Rationale for Medication Administration: 41 year old female, history of Schizoaffective Disorder, several prior psychiatric admissions , presents under IVC due to erratic, inappropriate  behaviors , including exposing herself to counselor, aggression, internally preoccupied ,repsonding to internal stimuli. At this time patient presents very irritable, pacing , angry, stating " you all are stupid". Patient has been refusing all her medications , including antihypertensives. I have reviewed case with staff , who report patient has been pacing, irritable, refusing to engage with staff, very irritable on approach. Valerie Howard has seen patient. Requested second opinion for medication over objection.    Jenne Campus, MD 03/09/17  1:08 PM   This documentation is good for (7) seven days from the date of the MD signature. New documentation must be completed every seven (7) days with detailed justification in the medical record if the patient requires continued non-emergent administration of psychotropic medications.

## 2017-03-09 NOTE — [facility] Suicide Risk Assessment (Signed)
[facility] Admission Suicide Risk Assessment   Nursing information obtained from:    Demographic factors:    Current Mental Status:    Loss Factors:    Historical Factors:    Risk Reduction Factors:     Total Time spent with patient: 1 hour Principal Problem: Schizoaffective disorder, bipolar type ([place]) Diagnosis:   Patient Active Problem List   Diagnosis Date Noted  . Schizoaffective disorder, bipolar type ([place]) [F25.0]     Priority: High  . Anemia, iron deficiency [D50.9] 10/13/2012    Priority: High  . Menorrhagia [N92.0] 05/12/2014    Priority: Medium  . Suicidal ideation [R45.851]   . Personality disorder [F60.9] 10/17/2012  . Tachycardia [R00.0] 10/01/2012   Subjective Data: Patient is a 41 year old female transferred [facility] under [place] [employer] due to patient being noncompliant with medications, being paranoid, delusional and having auditory visual hallucinations. Patient is agitated this morning states that she does not need to be in the hospital, adds that there is nothing wrong with her. Patient's thought processes are loose, she is pacing up and down, has pressured speech and is delusional  Continued Clinical Symptoms:  Alcohol Use Disorder Identification Test Final Score (AUDIT): 1 The "Alcohol Use Disorders Identification Test", Guidelines for Use in Primary Care, Second Edition.  World Pharmacologist [facility]). Score between 0-7:  no or low risk or alcohol related problems. Score between 8-15:  moderate risk of alcohol related problems. Score between 16-19:  high risk of alcohol related problems. Score 20 or above:  warrants further diagnostic evaluation for alcohol dependence and treatment.   CLINICAL FACTORS:   Severe Anxiety and/or Agitation Schizophrenia:   Paranoid or undifferentiated type More than one psychiatric diagnosis Currently Psychotic Previous Psychiatric Diagnoses and Treatments Medical Diagnoses and  Treatments/Surgeries   Musculoskeletal: Strength & Muscle Tone: within normal limits Gait & Station: normal Patient leans: N/A  Psychiatric Specialty Exam: Physical Exam  Review of Systems  Constitutional: Negative.  Negative for fever and malaise/fatigue.  Eyes: Negative.  Negative for blurred vision, discharge and redness.  Respiratory: Negative.  Negative for cough, shortness of breath and wheezing.   Cardiovascular: Negative.  Negative for palpitations.  Gastrointestinal: Negative.  Negative for heartburn and nausea.  Genitourinary:       Patient is having her periods, is diagnosed with anemia  Musculoskeletal: Negative.  Negative for falls and myalgias.  Neurological: Negative.  Negative for dizziness, seizures, loss of consciousness, weakness and headaches.  Endo/Heme/Allergies: Negative for environmental allergies.  Psychiatric/Behavioral: Positive for hallucinations. Negative for depression.    Blood pressure (!) 143/96, pulse (!) 121, temperature 99.2 F (37.3 C), temperature source Oral, resp. rate 16, height 5' 7.5" (1.715 m), weight 78.9 kg (174 lb).Body mass index is 26.85 kg/m.  General Appearance: Disheveled  Eye Contact:  Poor  Speech:  Pressured  Volume:  Normal  Mood:  Irritable  Affect:  Congruent and Labile  Thought Process:  Descriptions of Associations: Loose  Orientation:  Full (Time, Place, and Person)  Thought Content:  Delusions and Hallucinations: Auditory  Suicidal Thoughts:  No  Homicidal Thoughts:  No  Memory:  Immediate;   Fair Recent;   Fair Remote;   Fair  Judgement:  Poor  Insight:  Lacking  Psychomotor Activity:  Increased and Mannerisms  Concentration:  Concentration: Poor and Attention Span: Poor  Recall:  [employer] of Knowledge:  Fair  Language:  Fair  Akathisia:  No  Handed:  Right  AIMS (  if indicated):     Assets:  Housing  ADL's:  Impaired  Cognition:  Impaired,  Mild  Sleep:  Number of Hours: 2.5      COGNITIVE  FEATURES THAT CONTRIBUTE TO RISK:  Closed-mindedness, Loss of executive function and Thought constriction (tunnel vision)    SUICIDE RISK:   Minimal: No identifiable suicidal ideation.  Patients presenting with no risk factors but with morbid ruminations; may be classified as minimal risk based on the severity of the depressive symptoms  PLAN OF CARE: Patient is currently psychotic, paranoid, hallucinating and needs medications in order to stabilize her. Will get second opinion from Dr. Parke Poisson to start patient on Haldol by mouth IM twice a day for stabilization and treatment  I certify that inpatient services furnished can reasonably be expected to improve the patient's condition.   Hampton Abbot, MD 03/09/2017, 1:34 PM

## 2017-03-09 NOTE — H&P (Signed)
Psychiatric Admission Assessment Adult  Patient Identification: Valerie Howard  MRN:  [id number]  Date of Evaluation:  03/09/2017  Chief Complaint:  SCHIZOPHRENIA  Principal Diagnosis: Schizoaffective disorder, bipolar-type.  Diagnosis:   Patient Active Problem List   Diagnosis Date Noted  . Schizoaffective disorder, bipolar type ([place]) [F25.0]   . Suicidal ideation [R45.851]   . Personality disorder [F60.9] 10/17/2012  . Anemia, iron deficiency [D50.9] 10/13/2012  . Tachycardia [R00.0] 10/01/2012   History of Present Illness: This is an admission assessment for this 41 year old African-American female with hx of chronic mental illness (Schizoaffective disorder, bipolar-type). She has been a patient in this hospital x multiple times. She is well known to this unit & her hx of noncompliance to her treatment regimen well known. She is currently being admitted to this hospital under an involuntary commitment with complaints of erratic behaviors, paranoid ideations & psychosis. She is in need of mood stabilization treatments. During this assessment, [name] reports, "My name is Valerie Howard, I'm unemployed & divorced. I don't need to be here & I don't need to talk to you or discuss anything with you. If you check your records, you will see that I have been here too many times. And by the way, put this on my record, I don't want to be in contact with anyone. I have children".  Objective: Valerie Howard is seen, chart reviewed. She is seen in her room, lying down in her bed. She is alert, verbally responsive & aggressive. She barely made an eye contact. She presents with a restricted affect. She is agitated, irritated & restless with frequent verbal outbursts. The nursing staff reports indicate that Valerie Howard is refusing her medications, including the blood pressure regimen. Her blood pressure at time is elevated. She will be a good candidate for forced medication if she remains manic & continues to refuse her  medications. The Social worker may evaluate her need for an Act Team referral or recommendations after discharge. An injectable type of medication regimen on a monthly or bi-weekly basis may be appropriate for this patient due to the chronic nature of her mental illness.  Associated Signs/Symptoms:  Depression Symptoms:  insomnia, psychomotor agitation, difficulty concentrating, anxiety,  (Hypo) Manic Symptoms:  Distractibility, Elevated Mood, Irritable Mood, Labiality of Mood,  Anxiety Symptoms:  Excessive Worry, restlessness.  Psychotic Symptoms:  Patient presents with paranoid ideations.  PTSD Symptoms: Unable to obtain this information due to patient is currently experiencing manic episodes.  Total Time spent with patient: 1 hour  Past Psychiatric History: Schizoaffective disorder, Bipolar-type.  Is the patient at risk to self? No.  Has the patient been a risk to self in the past 6 months? No.  Has the patient been a risk to self within the distant past? No.  Is the patient a risk to others? No.  Has the patient been a risk to others in the past 6 months? No.  Has the patient been a risk to others within the distant past? No.   Prior Inpatient Therapy: Yes x numerous times. Prior Outpatient Therapy: Yes.  Alcohol Screening: 1. How often do you have a drink containing alcohol?: Monthly or less 2. How many drinks containing alcohol do you have on a typical day when you are drinking?: 1 or 2 3. How often do you have six or more drinks on one occasion?: Never Preliminary Score: 0 9. Have you or someone else been injured as a result of your drinking?: No 10. Has a relative or friend  or a doctor or another health worker been concerned about your drinking or suggested you cut down?: No Alcohol Use Disorder Identification Test Final Score (AUDIT): 1 Brief Intervention: AUDIT score less than 7 or less-screening does not suggest unhealthy drinking-brief intervention not  indicated  Substance Abuse History in the last 12 months:  No.  Consequences of Substance Abuse: NA  Previous Psychotropic Medications: Yes, patient has been on numerous psychotropic medications, however, has hx of non-compliance to medications.  Psychological Evaluations: No   Past Medical History:  Past Medical History:  Diagnosis Date  . Paranoid behavior ([place])   . Schizophrenia [facility])     Past Surgical History:  Procedure Laterality Date  . CESAREAN SECTION     Family History:  Family History  Problem Relation Age of Onset  . Hypertension Other   . Diabetes Other   . Cancer Other    Family Psychiatric  History: Unable to obtain this information from patient due to her current manic episode & unco operation.  Tobacco Screening: Have you used any form of tobacco in the last 30 days? (Cigarettes, Smokeless Tobacco, Cigars, and/or Pipes): No  Social History:  History  Alcohol Use  . Yes    Comment: socially     History  Drug Use No    Additional Social History:  Allergies:   Allergies  Allergen Reactions  . Penicillins Rash    Has patient had a PCN reaction causing immediate rash, facial/tongue/throat swelling, SOB or lightheadedness with hypotension:Patient refuses to answer (PRA) Has patient had a PCN reaction causing severe rash involving mucus membranes or skin necrosis:PRA Has patient had a PCN reaction that required hospitalization PRA Has patient had a PCN reaction occurring within the last 10 years:PRA If all of the above answers are "NO", then may proceed with Cephalosporin use.    Lab Results:  Results for orders placed or performed during the hospital encounter of 03/07/17 (from the past 48 hour(s))  Rapid urine drug screen (hospital performed)     Status: None   Collection Time: 03/07/17  4:36 PM  Result Value Ref Range   Opiates NONE DETECTED NONE DETECTED   Cocaine NONE DETECTED NONE DETECTED   Benzodiazepines NONE DETECTED NONE DETECTED    Amphetamines NONE DETECTED NONE DETECTED   Tetrahydrocannabinol NONE DETECTED NONE DETECTED   Barbiturates NONE DETECTED NONE DETECTED    Comment:        DRUG SCREEN FOR MEDICAL PURPOSES ONLY.  IF CONFIRMATION IS NEEDED FOR ANY PURPOSE, NOTIFY LAB WITHIN 5 DAYS.        LOWEST DETECTABLE LIMITS FOR URINE DRUG SCREEN Drug Class       Cutoff (ng/mL) Amphetamine      1000 Barbiturate      200 Benzodiazepine   030 Tricyclics       092 Opiates          300 Cocaine          300 THC              50   Pregnancy, urine     Status: None   Collection Time: 03/07/17  4:36 PM  Result Value Ref Range   Preg Test, Ur NEGATIVE NEGATIVE    Comment:        THE SENSITIVITY OF THIS METHODOLOGY IS >20 mIU/mL.   Comprehensive metabolic panel     Status: Abnormal   Collection Time: 03/07/17  5:33 PM  Result Value Ref Range   Sodium 139 135 -  145 mmol/L   Potassium 3.5 3.5 - 5.1 mmol/L   Chloride 109 101 - 111 mmol/L   CO2 21 (L) 22 - 32 mmol/L   Glucose, Bld 104 (H) 65 - 99 mg/dL   BUN 13 6 - 20 mg/dL   Creatinine, Ser 0.83 0.44 - 1.00 mg/dL   Calcium 9.2 8.9 - 10.3 mg/dL   Total Protein 8.6 (H) 6.5 - 8.1 g/dL   Albumin 4.5 3.5 - 5.0 g/dL   AST 13 (L) 15 - 41 U/L   ALT 8 (L) 14 - 54 U/L   Alkaline Phosphatase 58 38 - 126 U/L   Total Bilirubin 0.4 0.3 - 1.2 mg/dL   GFR calc non Af Amer >60 >60 mL/min   GFR calc Af Amer >60 >60 mL/min    Comment: (NOTE) The eGFR has been calculated using the CKD EPI equation. This calculation has not been validated in all clinical situations. eGFR's persistently <60 mL/min signify possible Chronic Kidney Disease.    Anion gap 9 5 - 15  Ethanol     Status: None   Collection Time: 03/07/17  5:33 PM  Result Value Ref Range   Alcohol, Ethyl (B) <5 <5 mg/dL    Comment:        LOWEST DETECTABLE LIMIT FOR SERUM ALCOHOL IS 5 mg/dL FOR MEDICAL PURPOSES ONLY   Salicylate level     Status: None   Collection Time: 03/07/17  5:33 PM  Result Value Ref Range    Salicylate Lvl <5.0 2.8 - 30.0 mg/dL  Acetaminophen level     Status: Abnormal   Collection Time: 03/07/17  5:33 PM  Result Value Ref Range   Acetaminophen (Tylenol), Serum <10 (L) 10 - 30 ug/mL    Comment:        THERAPEUTIC CONCENTRATIONS VARY SIGNIFICANTLY. A RANGE OF 10-30 ug/mL MAY BE AN EFFECTIVE CONCENTRATION FOR MANY PATIENTS. HOWEVER, SOME ARE BEST TREATED AT CONCENTRATIONS OUTSIDE THIS RANGE. ACETAMINOPHEN CONCENTRATIONS >150 ug/mL AT 4 HOURS AFTER INGESTION AND >50 ug/mL AT 12 HOURS AFTER INGESTION ARE OFTEN ASSOCIATED WITH TOXIC REACTIONS.   cbc     Status: Abnormal   Collection Time: 03/07/17  5:33 PM  Result Value Ref Range   WBC 7.0 4.0 - 10.5 K/uL   RBC 4.04 3.87 - 5.11 MIL/uL   Hemoglobin 7.6 (L) 12.0 - 15.0 g/dL   HCT 26.8 (L) 36.0 - 46.0 %   MCV 66.3 (L) 78.0 - 100.0 fL   MCH 18.8 (L) 26.0 - 34.0 pg   MCHC 28.4 (L) 30.0 - 36.0 g/dL   RDW 20.2 (H) 11.5 - 15.5 %   Platelets 437 (H) 150 - 400 K/uL   Blood Alcohol level:  Lab Results  Component Value Date   ETH <5 03/07/2017   ETH <5 93/26/7124   Metabolic Disorder Labs:  No results found for: HGBA1C, MPG No results found for: PROLACTIN No results found for: CHOL, TRIG, HDL, CHOLHDL, VLDL, LDLCALC  Current Medications: Current Facility-Administered Medications  Medication Dose Route Frequency Provider Last Rate Last Dose  . acetaminophen (TYLENOL) tablet 650 mg  650 mg Oral Q4H PRN Patrecia Pour, NP      . alum & mag hydroxide-simeth (MAALOX/MYLANTA) 200-200-20 MG/5ML suspension 30 mL  30 mL Oral Q6H PRN Patrecia Pour, NP      . ferrous sulfate tablet 325 mg  325 mg Oral Q breakfast Patrecia Pour, NP      . gabapentin (NEURONTIN) capsule 300 mg  300 mg Oral BID Patrecia Pour, NP      . magnesium hydroxide (MILK OF MAGNESIA) suspension 30 mL  30 mL Oral Daily PRN Patrecia Pour, NP      . nicotine (NICODERM CQ - dosed in mg/24 hours) patch 21 mg  21 mg Transdermal Daily Lord, Jamison Y, NP       . OLANZapine (ZYPREXA) tablet 10 mg  10 mg Oral BID Patrecia Pour, NP      . ondansetron [facility]) tablet 4 mg  4 mg Oral Q8H PRN Patrecia Pour, NP       PTA Medications: Prescriptions Prior to Admission  Medication Sig Dispense Refill Last Dose  . OLANZapine (ZYPREXA) 10 MG tablet Take 10 mg by mouth 2 (two) times daily.   March 2017 at unknown time   Musculoskeletal: Strength & Muscle Tone: within normal limits Gait & Station: normal Patient leans: N/A  Psychiatric Specialty Exam: Physical Exam  Constitutional: She appears well-developed.  HENT:  Head: Normocephalic.  Eyes: Pupils are equal, round, and reactive to light.  Neck: Normal range of motion.  Cardiovascular:  Elevated blood pressure & pulse rate. Initiated Lisinopril 20 mg once today, then 10 mg daily starting 03-10-17.  Respiratory: Effort normal.  GI: Soft.  Genitourinary:  Genitourinary Comments: Deferred  Musculoskeletal: Normal range of motion.  Neurological: She is alert.  Skin: Skin is warm.    Review of Systems  Constitutional: Negative.   HENT: Negative.   Eyes: Negative.   Respiratory: Negative.   Cardiovascular: Negative.   Gastrointestinal: Negative.   Genitourinary: Negative.   Musculoskeletal: Negative.   Skin: Negative.   Neurological: Negative.   Endo/Heme/Allergies: Negative.   Psychiatric/Behavioral: Positive for depression and hallucinations (Patient presents with paranoid ideations.). Negative for memory loss, substance abuse and suicidal ideas. The patient is nervous/anxious (Patient presents with irritability & agitation.) and has insomnia.     Blood pressure (!) 147/129, pulse (!) 121, temperature 99.2 F (37.3 C), temperature source Oral, resp. rate 16, height 5' 7.5" (1.715 m), weight 78.9 kg (174 lb).Body mass index is 26.85 kg/m.  General Appearance: Fairly groomed, agitated, manic, irritated, angry.  Eye Contact:  Fair  Speech:  Clear, coherent, pressured  Volume:   Increased  Mood:  Angry, Dysphoric, Irritable and manic  Affect:  Labile  Thought Process:  Disorganized and Descriptions of Associations: Loose  Orientation:  Full (Time, Place, and Person)  Thought Content:  Paranoid Ideation  Suicidal Thoughts:  Unable to assess, patient is not cooperative.  Homicidal Thoughts:  Unable to assess, patient is not cooperative.  Memory:  Immediate;   Good Recent;   Good Remote;   Good  Judgement:  Impaired  Insight:  Lacking  Psychomotor Activity:  Restless, agitated, irritated  Concentration:  Concentration: Poor and Attention Span: Poor  Recall:  Good  Fund of Knowledge:  Limited  Language:  Good  Akathisia:  Negative  Handed:  Right  AIMS (if indicated):     Assets:  Communication Skills  ADL's:  Intact  Cognition:  WNL  Sleep:  Number of Hours: 2.5   Treatment Plan/Recommendations: 1. Admit for crisis management and stabilization, estimated length of stay 5-7 days.   2. Medication management to reduce current symptoms to base line and improve the patient's overall level of functioning: Initiated Cogentin 1 mg bid for EPS,  Olanzapine 10 mg bid for mood control. Haldol 5 mg PO or IM for agitation/anxiety. Lorazepam 1 mg Q 6 hours prn  IM or PO for agitation/anxiety. Gabapentin 300 mg bid for agitation. Patient may be a candidate for monthly or bi-weekly injectable antipsychotic medications.  3. Treat health problems as indicated: Lisinopril 20 mg once today for elevated blood pressure. Lisinopril 10 mg daily starting 03-10-17 for HTN. Ferrous sulfate 325 mg bid for iron deficiency anemia. Colace 100 mg bid prevention of constipation from use of ferrous sulfate.  4. Develop treatment plan to decrease risk of &  the need for readmission.  5. Psycho-social education regarding self care.  6. Health care follow up as needed for medical problems.  7. Review, reconcile, and reinstate any pertinent home medications for other health issues where  appropriate. 8. Call for consults with hospitalist for any additional specialty patient care services as needed.  Observation Level/Precautions:  15 minute checks  Laboratory:  Per ED  Psychotherapy: Group sessions   Medications: See [facility]  Consultations: As needed   Discharge Concerns: Maintaining mood stability, Compliance with treatment regimen.   Estimated LOS: 5-7 days  Other: Admit to the [facility].    Physician Treatment Plan for Primary Diagnosis: Will initiate medication management for mood stability. Set up an outpatient psychiatric services for medication management after discharge. Will encourage medication adherence with psychiatric medications.  Long Term Goal(s): Improvement in symptoms so as ready for discharge  Short Term Goals: Ability to identify changes in lifestyle to reduce recurrence of condition will improve, Ability to verbalize feelings will improve and Ability to demonstrate self-control will improve  Physician Treatment Plan for Secondary Diagnosis: Active Problems:   Schizoaffective disorder, bipolar type ([place])  Long Term Goal(s): Improvement in symptoms so as ready for discharge  Short Term Goals: Ability to identify and develop effective coping behaviors will improve and Compliance with prescribed medications will improve  I certify that inpatient services furnished can reasonably be expected to improve the patient's condition.    Encarnacion Slates, NP, PMHNP, FNP-BC 7/12/20189:08 AM

## 2017-03-09 NOTE — Progress Notes (Signed)
Patient did not attend group.

## 2017-03-09 NOTE — Progress Notes (Signed)
Dar Note: Patient is alert and presents with angry affect and irritable mood.  Verbally aggressive and abusive towards staff.  Refused all medications after several attempts with encouragement.  Observed pacing the hallway in a posturing gesture.  Speech is pressured, tangential and delusional.  Argumentative with staff when redirected on the unit.  Remained preoccupied with getting discharge.  Staff was able to give medication with show of force.  Patient remained in her room most of this shift.  Patient is safe on the unit.

## 2017-03-09 NOTE — BHH Counselor (Signed)
PSA attempt w patient - patient refused stating "I dont need your help or anyone's help, just go to someone who needs you."  CSW unable to complete PSA.  Edwyna Shell, LCSW Lead Clinical Social Worker Phone:  [phone]

## 2017-03-09 NOTE — Progress Notes (Signed)
Recreation Therapy Notes  Date: 03/09/2017 Time: 10:00am Location: [address] Dayroom  Group Topic: Leisure Education and Goal Setting  Goal Area(s) Addresses:  Pt will be able to successfully identify 10 things they would like to accomplish during their lifetime. Patients will be able to successfully identify 2 ways they can reach their goals.  Intervention: Worksheet  Activity: Pt was asked to create a bucket list of things they would like to accomplish during their lifetime. Pt was then asked to pick their top two things and create a goal for each on how they were going to accomplish them.  Education: Leisure Education, Financial trader, Discharge Planning  Education Outcome: Needs additional education  Clinical Observations/Feedback: Pt did not attend group.  Donovan Kail, Recreation Therapy Intern

## 2017-03-09 NOTE — [facility] Group Notes (Signed)
[place] LCSW Group Therapy  03/09/2017 1:15 - 2 PM  Type of Therapy:  Group Therapy  Participation Level:  Unable to attend due to current psychiatric condition  Summary of Progress/Problems: [facility] Speaker came to talk about his personal journey with substance abuse and addiction. The pt processed ways by which to relate to the speaker. [place] speaker provided handouts and educational information pertaining to groups and services offered by the [facility].   Beverely Pace 03/09/2017, 1:45 PM

## 2017-03-10 DIAGNOSIS — Z560 Unemployment, unspecified: Secondary | ICD-10-CM

## 2017-03-10 DIAGNOSIS — D509 Iron deficiency anemia, unspecified: Secondary | ICD-10-CM

## 2017-03-10 MED ORDER — FERROUS SULFATE 325 (65 FE) MG PO TABS
325.0000 mg | ORAL_TABLET | Freq: Three times a day (TID) | ORAL | Status: DC
  Filled 2017-03-10 (×38): qty 1

## 2017-03-10 NOTE — Plan of Care (Signed)
Problem: Safety: Goal: Periods of time without injury will increase Outcome: Progressing Pt safe on the unit at this time   

## 2017-03-10 NOTE — Progress Notes (Signed)
[facility] MD Progress Note  03/10/2017 4:36 PM Valerie Howard  MRN:  [id number]  Subjective: Valerie Howard reports, "I don't kneed your services. Thank you & have a good day".  Objective: Valerie Howard is seen, chart reviewed. Discussed this case with the treatment team. Patient remains manic, agitated, verbally aggressive & continue to refuse treatment. She is loud when approached. She was observed on the phone this morning talking to what seem like her family member, her speech was not pressured. She seem calm, civil, no outburst noted. She is currently on a forced medication order. Called a hospitalist to consult on her anemic condition. See consult notes dated 03-10-17. See new lab orders.  Principal Problem: Schizoaffective disorder, bipolar type ([place])  Diagnosis:   Patient Active Problem List   Diagnosis Date Noted  . Schizoaffective disorder, bipolar type ([place]) [F25.0]   . Suicidal ideation [R45.851]   . Menorrhagia [N92.0] 05/12/2014  . Personality disorder [F60.9] 10/17/2012  . Anemia, iron deficiency [D50.9] 10/13/2012  . Tachycardia [R00.0] 10/01/2012   Total Time spent with patient: 25 minutes  Past Psychiatric History: Schizoaffective disorder, Bipolar-type  Past Medical History:  Past Medical History:  Diagnosis Date  . Paranoid behavior ([place])   . Schizophrenia [facility])     Past Surgical History:  Procedure Laterality Date  . CESAREAN SECTION     Family History:  Family History  Problem Relation Age of Onset  . Hypertension Other   . Diabetes Other   . Cancer Other    Family Psychiatric  History: See H&P  Social History:  History  Alcohol Use  . Yes    Comment: socially     History  Drug Use No    Social History   Social History  . Marital status: Legally Separated    Spouse name: N/A  . Number of children: N/A  . Years of education: N/A   Social History Main Topics  . Smoking status: Never Smoker  . Smokeless tobacco: Never Used  . Alcohol use Yes     Comment:  socially  . Drug use: No  . Sexual activity: No   Other Topics Concern  . None   Social History Narrative  . None   Additional Social History:   Sleep: Poor  Appetite:  Fair  Current Medications: Current Facility-Administered Medications  Medication Dose Route Frequency Provider Last Rate Last Dose  . acetaminophen (TYLENOL) tablet 650 mg  650 mg Oral Q4H PRN Patrecia Pour, NP      . alum & mag hydroxide-simeth (MAALOX/MYLANTA) 200-200-20 MG/5ML suspension 30 mL  30 mL Oral Q6H PRN Patrecia Pour, NP      . benztropine (COGENTIN) tablet 0.5 mg  0.5 mg Oral BID Cobos, Myer Peer, MD   0.5 mg at 03/10/17 0900   Or  . benztropine mesylate (COGENTIN) injection 0.5 mg  0.5 mg Intramuscular BID Cobos, Myer Peer, MD      . docusate sodium (COLACE) capsule 100 mg  100 mg Oral BID Lindell Spar I, NP   100 mg at 03/10/17 0900  . ferrous sulfate tablet 325 mg  325 mg Oral BID WC Latosha Gaylord I, NP   325 mg at 03/10/17 0900  . gabapentin (NEURONTIN) capsule 300 mg  300 mg Oral BID Patrecia Pour, NP   300 mg at 03/10/17 0900  . haloperidol (HALDOL) tablet 5 mg  5 mg Oral Q6H PRN Lindell Spar I, NP   5 mg at 03/09/17 2033   Or  .  haloperidol lactate (HALDOL) injection 5 mg  5 mg Intramuscular Q6H PRN Alessander Sikorski I, NP      . haloperidol (HALDOL) tablet 5 mg  5 mg Oral BID Cobos, Fernando A, MD   5 mg at 03/10/17 0900   Or  . haloperidol lactate (HALDOL) injection 5 mg  5 mg Intramuscular BID Cobos, Fernando A, MD      . lisinopril (PRINIVIL,ZESTRIL) tablet 10 mg  10 mg Oral Daily [place] Justo, Herbert Pun I, NP   10 mg at 03/10/17 0900  . lisinopril (PRINIVIL,ZESTRIL) tablet 20 mg  20 mg Oral Once Lindell Spar I, NP      . LORazepam (ATIVAN) tablet 1 mg  1 mg Oral Q6H PRN Lindell Spar I, NP   1 mg at 03/09/17 2033   Or  . LORazepam (ATIVAN) injection 1 mg  1 mg Intramuscular Q6H PRN Meng Winterton I, NP      . LORazepam (ATIVAN) tablet 2 mg  2 mg Oral BID Cobos, Myer Peer, MD   2 mg at 03/10/17 0900    Or  . LORazepam (ATIVAN) injection 2 mg  2 mg Intramuscular BID Cobos, Fernando A, MD      . magnesium hydroxide (MILK OF MAGNESIA) suspension 30 mL  30 mL Oral Daily PRN Patrecia Pour, NP      . nicotine (NICODERM CQ - dosed in mg/24 hours) patch 21 mg  21 mg Transdermal Daily Lord, Jamison Y, NP      . ondansetron [facility]) tablet 4 mg  4 mg Oral Q8H PRN Patrecia Pour, NP       Lab Results: No results found for this or any previous visit (from the past 48 hour(s)).  Blood Alcohol level:  Lab Results  Component Value Date   ETH <5 03/07/2017   ETH <5 27/01/2375   Metabolic Disorder Labs: No results found for: HGBA1C, MPG No results found for: PROLACTIN No results found for: CHOL, TRIG, HDL, CHOLHDL, VLDL, LDLCALC  Physical Findings: AIMS: Facial and Oral Movements Muscles of Facial Expression: None, normal Lips and Perioral Area: None, normal Jaw: None, normal Tongue: None, normal,Extremity Movements Upper (arms, wrists, hands, fingers): None, normal Lower (legs, knees, ankles, toes): None, normal, Trunk Movements Neck, shoulders, hips: None, normal, Overall Severity Severity of abnormal movements (highest score from questions above): None, normal Incapacitation due to abnormal movements: None, normal Patient's awareness of abnormal movements (rate only patient's report): No Awareness, Dental Status Current problems with teeth and/or dentures?: No Does patient usually wear dentures?: No  CIWA:    COWS:     Musculoskeletal: Strength & Muscle Tone: within normal limits Gait & Station: normal Patient leans: Right  Psychiatric Specialty Exam: Physical Exam  ROS  Blood pressure (!) 129/94, pulse (!) 129, temperature 99 F (37.2 C), resp. rate 20, height 5' 7.5" (1.715 m), weight 78.9 kg (174 lb), SpO2 100 %.Body mass index is 26.85 kg/m.  General Appearance: Fairly groomed, agitated, manic, irritated, angry.  Eye Contact:  Fair  Speech:  Clear, coherent, pressured   Volume:  Increased  Mood:  Angry, Dysphoric, Irritable and manic  Affect:  Labile  Thought Process:  Disorganized and Descriptions of Associations: Loose  Orientation:  Full (Time, Place, and Person)  Thought Content:  Paranoid Ideation  Suicidal Thoughts:  Unable to assess, patient is not cooperative.  Homicidal Thoughts:  Unable to assess, patient is not cooperative.  Memory:  Immediate;   Good Recent;   Good Remote;   Good  Judgement:  Impaired  Insight:  Lacking  Psychomotor Activity:  Restless, agitated, irritated  Concentration:  Concentration: Poor and Attention Span: Poor  Recall:  Good  Fund of Knowledge:  Limited  Language:  Good  Akathisia:  Negative  Handed:  Right  AIMS (if indicated):     Assets:  Communication Skills  ADL's:  Intact  Cognition:  WNL  Sleep:  Number of Hours: 2.5     Treatment Plan Summary: Patient remains with evidence of psychosis & of mania. We are continuing the forced medication order.     Psychiatric: Schizoaffective disorder, bipolar-type  Medical: Will continue monitor for any symptoms & treat on a prn basis.  Psychosocial:  Unemployed  Divorced Will encourage group counseling attendance & participation.  PLAN: 1.03-10-17: No changes made on the current plan of care, continue current regimen as recommended.  Patient is currently on a forced medication order due to her current state of mind & refusal to take her medications regimen.   Continue current plan of care as recommended. (See MAR).  Called primary care consult for her iron deficiency anemia. See consult notes.  See order for Anemia panel & fecal ocult test. Increased Ferrous sulfate 325 mg to tid as recommended the primary care hospitalist.  2. Continue to monitor mood, behavior and interaction with peers   Encarnacion Slates, NP, PMHNP, FNP-BC   Encarnacion Slates, NP, PMHNP, FNP-BC 03/10/2017, 4:36 PM

## 2017-03-10 NOTE — Tx Team (Signed)
Interdisciplinary Treatment and Diagnostic Plan Update  03/10/2017 Time of Session: 3:56 PM  IVANNAH ZODY MRN: [id number]  Principal Diagnosis: Schizoaffective disorder, bipolar type ([place])  Secondary Diagnoses: Principal Problem:   Schizoaffective disorder, bipolar type ([place]) Active Problems:   Anemia, iron deficiency   Menorrhagia   Current Medications:  Current Facility-Administered Medications  Medication Dose Route Frequency Provider Last Rate Last Dose  . acetaminophen (TYLENOL) tablet 650 mg  650 mg Oral Q4H PRN Patrecia Pour, NP      . alum & mag hydroxide-simeth (MAALOX/MYLANTA) 200-200-20 MG/5ML suspension 30 mL  30 mL Oral Q6H PRN Patrecia Pour, NP      . benztropine (COGENTIN) tablet 0.5 mg  0.5 mg Oral BID Cobos, Myer Peer, MD   0.5 mg at 03/10/17 0900   Or  . benztropine mesylate (COGENTIN) injection 0.5 mg  0.5 mg Intramuscular BID Cobos, Myer Peer, MD      . docusate sodium (COLACE) capsule 100 mg  100 mg Oral BID Lindell Spar I, NP   100 mg at 03/10/17 0900  . ferrous sulfate tablet 325 mg  325 mg Oral BID WC Nw[place], Agnes I, NP   325 mg at 03/10/17 0900  . gabapentin (NEURONTIN) capsule 300 mg  300 mg Oral BID Patrecia Pour, NP   300 mg at 03/10/17 0900  . haloperidol (HALDOL) tablet 5 mg  5 mg Oral Q6H PRN Lindell Spar I, NP   5 mg at 03/09/17 2033   Or  . haloperidol lactate (HALDOL) injection 5 mg  5 mg Intramuscular Q6H PRN Lindell Spar I, NP      . haloperidol (HALDOL) tablet 5 mg  5 mg Oral BID Cobos, Myer Peer, MD   5 mg at 03/10/17 0900   Or  . haloperidol lactate (HALDOL) injection 5 mg  5 mg Intramuscular BID Cobos, Fernando A, MD      . lisinopril (PRINIVIL,ZESTRIL) tablet 10 mg  10 mg Oral Daily Lindell Spar I, NP   10 mg at 03/10/17 0900  . lisinopril (PRINIVIL,ZESTRIL) tablet 20 mg  20 mg Oral Once Lindell Spar I, NP      . LORazepam (ATIVAN) tablet 1 mg  1 mg Oral Q6H PRN Lindell Spar I, NP   1 mg at 03/09/17 2033   Or  . LORazepam  (ATIVAN) injection 1 mg  1 mg Intramuscular Q6H PRN Nwoko, Agnes I, NP      . LORazepam (ATIVAN) tablet 2 mg  2 mg Oral BID Cobos, Myer Peer, MD   2 mg at 03/10/17 0900   Or  . LORazepam (ATIVAN) injection 2 mg  2 mg Intramuscular BID Cobos, Fernando A, MD      . magnesium hydroxide (MILK OF MAGNESIA) suspension 30 mL  30 mL Oral Daily PRN Patrecia Pour, NP      . nicotine (NICODERM CQ - dosed in mg/24 hours) patch 21 mg  21 mg Transdermal Daily Lord, Jamison Y, NP      . ondansetron [facility]) tablet 4 mg  4 mg Oral Q8H PRN Patrecia Pour, NP        PTA Medications: Prescriptions Prior to Admission  Medication Sig Dispense Refill Last Dose  . OLANZapine (ZYPREXA) 10 MG tablet Take 10 mg by mouth 2 (two) times daily.   March 2017 at unknown time    Patient Stressors: Medication change or noncompliance  Patient Strengths: Average or above average intelligence Capable of independent living Physical Health  Treatment Modalities: Medication Management, Group therapy, Case management,  1 to 1 session with clinician, Psychoeducation, Recreational therapy.   Physician Treatment Plan for Primary Diagnosis: Schizoaffective disorder, bipolar type ([place]) Long Term Goal(s): Improvement in symptoms so as ready for discharge  Short Term Goals: Ability to identify changes in lifestyle to reduce recurrence of condition will improve Ability to verbalize feelings will improve Ability to demonstrate self-control will improve Ability to identify and develop effective coping behaviors will improve Compliance with prescribed medications will improve  Medication Management: Evaluate patient's response, side effects, and tolerance of medication regimen.  Therapeutic Interventions: 1 to 1 sessions, Unit Group sessions and Medication administration.  Evaluation of Outcomes: Not Progressing  Physician Treatment Plan for Secondary Diagnosis: Principal Problem:   Schizoaffective disorder, bipolar type  ([place]) Active Problems:   Anemia, iron deficiency   Menorrhagia   Long Term Goal(s): Improvement in symptoms so as ready for discharge  Short Term Goals: Ability to identify changes in lifestyle to reduce recurrence of condition will improve Ability to verbalize feelings will improve Ability to demonstrate self-control will improve Ability to identify and develop effective coping behaviors will improve Compliance with prescribed medications will improve  Medication Management: Evaluate patient's response, side effects, and tolerance of medication regimen.  Therapeutic Interventions: 1 to 1 sessions, Unit Group sessions and Medication administration.  Evaluation of Outcomes: Not Progressing   RN Treatment Plan for Primary Diagnosis: Schizoaffective disorder, bipolar type ([place]) Long Term Goal(s): Knowledge of disease and therapeutic regimen to maintain health will improve  Short Term Goals: Ability to identify and develop effective coping behaviors will improve and Compliance with prescribed medications will improve  Medication Management: RN will administer medications as ordered by provider, will assess and evaluate patient's response and provide education to patient for prescribed medication. RN will report any adverse and/or side effects to prescribing provider.  Therapeutic Interventions: 1 on 1 counseling sessions, Psychoeducation, Medication administration, Evaluate responses to treatment, Monitor vital signs and CBGs as ordered, Perform/monitor CIWA, COWS, AIMS and Fall Risk screenings as ordered, Perform wound care treatments as ordered.  Evaluation of Outcomes: Not Progressing   LCSW Treatment Plan for Primary Diagnosis: Schizoaffective disorder, bipolar type ([place]) Long Term Goal(s): Safe transition to appropriate next level of care at discharge, Engage patient in therapeutic group addressing interpersonal concerns.  Short Term Goals: Engage patient in aftercare planning  with referrals and resources  Therapeutic Interventions: Assess for all discharge needs, 1 to 1 time with Social worker, Explore available resources and support systems, Assess for adequacy in community support network, Educate family and significant other(s) on suicide prevention, Complete Psychosocial Assessment, Interpersonal group therapy.  Evaluation of Outcomes: Not Met  Pt is refusing services post d/c   Progress in Treatment: Attending groups: Yes Participating in groups: Yes Taking medication as prescribed: Yes Toleration medication: Yes, no side effects reported at this time Family/Significant other contact made:  Patient understands diagnosis: Yes AEB Discussing patient identified problems/goals with staff: Yes Medical problems stabilized or resolved: Yes Denies suicidal/homicidal ideation: Yes Issues/concerns per patient self-inventory: None Other: N/A  New problem(s) identified: None identified at this time.   New Short Term/Long Term Goal(s): None identified at this time.   Discharge Plan or Barriers:  Pt is refusing anti-psychotic medication and is getting injections as a consequence  Reason for Continuation of Hospitalization: Disorganization Mood instability Paranoia Medication stabilization   Estimated Length of Stay: 5 days  Attendees: Patient: 03/10/2017  3:56 PM  Physician: Hampton Abbot, MD  03/10/2017  3:56 PM  Nursing: Sena Hitch, RN 03/10/2017  3:56 PM  RN Care Manager: Lars Pinks, RN 03/10/2017  3:56 PM  Social Worker: Ripley Fraise 03/10/2017  3:56 PM  Recreational Therapist: Winfield Cunas 03/10/2017  3:56 PM  Other: Norberto Sorenson 03/10/2017  3:56 PM  Other:  03/10/2017  3:56 PM    Scribe for Treatment Team:  Roque Lias LCSW 03/10/2017 3:56 PM

## 2017-03-10 NOTE — Progress Notes (Signed)
Recreation Therapy Notes  Date: 03/10/2017 Time: 10:00am  Location: 500 [address] Dayroom  Group Topic: Teamwork and Problem-Solving  Goal Area(s) Addresses:  Patient will be able to successfully identify what characteristics make up teamwork. Patient will be able to successfully identify how teamwork can benefit their lives post d/c. Patient will be able to successfully identify how problem-solving skills can improve their lives post d/c.  Intervention: Game  Activity: Pts will work together to keep a Marketing executive from touching the ground. Pts will hit ball back and forth to one another to accomplish this goal.  Education: Teamwork, Problem-Solving, Discharge Planning  Education Outcome: Needs additional education  Clinical Observations/Feedback: Pt did not attend group.  Donovan Kail, Recreation Therapy Intern  Victorino Sparrow, LRT/CTRS

## 2017-03-10 NOTE — Progress Notes (Signed)
D- Patient remains angry, agitated, irritable, and verbally aggressive towards staff.  Patient isolates herself from staff and peers. Patient will come to nurses station for requests and then returns to her room. Patient initially refused all medications. Two RN's on the unit approached patient to request patient take her medication. Patient initially refused.  A female and female RN went down together to offer patient her medications.  Patient's room and bathroom doors were open.  Patient was in the bathroom, fully dressed.  Both RN's went into patient's room and offered her medications.  Patient became increasingly agitated, RN's stepped into hallway.  Patient soon followed, and with encouragement, took medications. Patient refused to attend groups. Patient appeared to be sleeping for majority of the shift.  A- Routine safety checks conducted every 15 minutes.  Patient informed to notify staff with problems or concerns. R- Patient remains safe at this time.

## 2017-03-10 NOTE — Consult Note (Signed)
Phone consultation regarding low Hgb.  Patient with what appears chronic iron deficiency anemia as per records review (Hgb fluctuating over the years with baseline in the 8-9 range; MCV 66.3 and current Hgb 7.6). At this point will recommend FOBT and also anemia panel. Ok to continue ferrous sulfate (but will recommend increasing dose to TID). If patient found to have active bleeding or any signs of symptomatic anemia, will benefit of being transfuse and have further work up (like EGD or Colonoscopy) at that time. If no signs of bleeding; continue psychiatry therapy and will recommend outpatient follow up.  Thanks for the consult; please once results available or if any other concerns don hesitate to contact us.  Barton Dubois MD [phone]

## 2017-03-10 NOTE — Progress Notes (Signed)
"  I don't want to deal with you either". Pt informed Probation officer that she did not want to talk or engage with Probation officer. Pt was not forth coming with information when writer tried to engage. "i don't want to talk to you"

## 2017-03-10 NOTE — Progress Notes (Signed)
Recreation Therapy Notes  Approximately 9:00am Recreation Therapy Intern asked to assess pt. Pt responded, "I don't need anything from you and I hope you have a wonderful day." Pt declined assessment.  Donovan Kail, Recreation Therapy Intern  Victorino Sparrow, LRT/CTRS    Donovan Kail 03/10/2017 11:29 AM

## 2017-03-10 NOTE — Plan of Care (Signed)
Problem: Safety: Goal: Ability to remain free from injury will improve Outcome: Progressing Patient is safe on the unit and is free from injury.  Routine safety checks continues every 15 minutes.
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
## 2017-03-10 NOTE — Progress Notes (Signed)
Dar Note: Patient presents with irritable affect and mood.  Verbally aggressive towards staff.  Refused to participate in assessment and did not attend group.  Refused medication initially when offered but later took her medication when given the other alternative.  Refused her Lisinopril, ferrous sulfate, docusate, and Neurontin.  RNP made aware.  Patient remained withdrawn and isolative to her room most of this shift.  Routine safety checks continues.  Patient is safe on the unit.

## 2017-03-11 NOTE — Progress Notes (Signed)
"  I don't know you and I don't want to talk to you". Writer tried to introduce self to pt and pt continued to be irritable , walked away from Probation officer and refused to talk. Pt seen on the unit irritable, heard on the phone being loud , pt continues to be resistant to care

## 2017-03-11 NOTE — Progress Notes (Signed)
[facility] MD Progress Note  03/11/2017 11:32 AM KAEDYNCE TAPP  MRN:  [id number]  Subjective: Dylan reports " what do you want? I don't need anything from you" thank you and have a good day"  Objective:  Derisha L Tyree is awake and  Alert. Seen resting in bedroom. Patient appears irritable and agitated during this discussion.  Patient is guarded and direct with no answering or responding to questions. patient reports " I am grown and I want to go home.". Patient was placed on a forced medication order, however pre nursing notes patient is taking some medication by mouth.  Pending internal med consult.  Support, encouragement and reassurance was provided.   Principal Problem: Schizoaffective disorder, bipolar type ([name])  Diagnosis:   Patient Active Problem List   Diagnosis Date Noted  . Schizoaffective disorder, bipolar type ([place]) [F25.0]   . Suicidal ideation [R45.851]   . Menorrhagia [N92.0] 05/12/2014  . Personality disorder [F60.9] 10/17/2012  . Anemia, iron deficiency [D50.9] 10/13/2012  . Tachycardia [R00.0] 10/01/2012   Total Time spent with patient: 25 minutes  Past Psychiatric History: Schizoaffective disorder, Bipolar-type  Past Medical History:  Past Medical History:  Diagnosis Date  . Paranoid behavior ([place])   . Schizophrenia [facility])     Past Surgical History:  Procedure Laterality Date  . CESAREAN SECTION     Family History:  Family History  Problem Relation Age of Onset  . Hypertension Other   . Diabetes Other   . Cancer Other    Family Psychiatric  History: See H&P  Social History:  History  Alcohol Use  . Yes    Comment: socially     History  Drug Use No    Social History   Social History  . Marital status: Legally Separated    Spouse name: N/A  . Number of children: N/A  . Years of education: N/A   Social History Main Topics  . Smoking status: Never Smoker  . Smokeless tobacco: Never Used  . Alcohol use Yes     Comment: socially  . Drug use:  No  . Sexual activity: No   Other Topics Concern  . None   Social History Narrative  . None   Additional Social History:   Sleep: Poor  Appetite:  Fair  Current Medications: Current Facility-Administered Medications  Medication Dose Route Frequency Provider Last Rate Last Dose  . acetaminophen (TYLENOL) tablet 650 mg  650 mg Oral Q4H PRN Patrecia Pour, NP      . alum & mag hydroxide-simeth (MAALOX/MYLANTA) 200-200-20 MG/5ML suspension 30 mL  30 mL Oral Q6H PRN Patrecia Pour, NP      . benztropine (COGENTIN) tablet 0.5 mg  0.5 mg Oral BID Cobos, Myer Peer, MD   0.5 mg at 03/11/17 3016   Or  . benztropine mesylate (COGENTIN) injection 0.5 mg  0.5 mg Intramuscular BID Cobos, Myer Peer, MD      . docusate sodium (COLACE) capsule 100 mg  100 mg Oral BID Lindell Spar I, NP   100 mg at 03/10/17 0900  . ferrous sulfate tablet 325 mg  325 mg Oral TID WC Nwoko, Agnes I, NP      . gabapentin (NEURONTIN) capsule 300 mg  300 mg Oral BID Patrecia Pour, NP   300 mg at 03/10/17 0900  . haloperidol (HALDOL) tablet 5 mg  5 mg Oral Q6H PRN Lindell Spar I, NP   5 mg at 03/09/17 2033   Or  .  haloperidol lactate (HALDOL) injection 5 mg  5 mg Intramuscular Q6H PRN Nwoko, Agnes I, NP      . haloperidol (HALDOL) tablet 5 mg  5 mg Oral BID Cobos, Myer Peer, MD   5 mg at 03/11/17 9147   Or  . haloperidol lactate (HALDOL) injection 5 mg  5 mg Intramuscular BID Cobos, Fernando A, MD      . lisinopril (PRINIVIL,ZESTRIL) tablet 10 mg  10 mg Oral Daily Lindell Spar I, NP   10 mg at 03/10/17 0900  . lisinopril (PRINIVIL,ZESTRIL) tablet 20 mg  20 mg Oral Once Lindell Spar I, NP      . LORazepam (ATIVAN) tablet 1 mg  1 mg Oral Q6H PRN Lindell Spar I, NP   1 mg at 03/09/17 2033   Or  . LORazepam (ATIVAN) injection 1 mg  1 mg Intramuscular Q6H PRN Nwoko, Agnes I, NP      . LORazepam (ATIVAN) tablet 2 mg  2 mg Oral BID Cobos, Myer Peer, MD   2 mg at 03/11/17 8295   Or  . LORazepam (ATIVAN) injection 2 mg   2 mg Intramuscular BID Cobos, Fernando A, MD      . magnesium hydroxide (MILK OF MAGNESIA) suspension 30 mL  30 mL Oral Daily PRN Patrecia Pour, NP      . nicotine (NICODERM CQ - dosed in mg/24 hours) patch 21 mg  21 mg Transdermal Daily Lord, Jamison Y, NP      . ondansetron [facility]) tablet 4 mg  4 mg Oral Q8H PRN Patrecia Pour, NP       Lab Results: No results found for this or any previous visit (from the past 48 hour(s)).  Blood Alcohol level:  Lab Results  Component Value Date   ETH <5 03/07/2017   ETH <5 62/13/0865   Metabolic Disorder Labs: No results found for: HGBA1C, MPG No results found for: PROLACTIN No results found for: CHOL, TRIG, HDL, CHOLHDL, VLDL, LDLCALC  Physical Findings: AIMS: Facial and Oral Movements Muscles of Facial Expression: None, normal Lips and Perioral Area: None, normal Jaw: None, normal Tongue: None, normal,Extremity Movements Upper (arms, wrists, hands, fingers): None, normal Lower (legs, knees, ankles, toes): None, normal, Trunk Movements Neck, shoulders, hips: None, normal, Overall Severity Severity of abnormal movements (highest score from questions above): None, normal Incapacitation due to abnormal movements: None, normal Patient's awareness of abnormal movements (rate only patient's report): No Awareness, Dental Status Current problems with teeth and/or dentures?: No Does patient usually wear dentures?: No  CIWA:    COWS:     Musculoskeletal: Strength & Muscle Tone: within normal limits Gait & Station: normal Patient leans: Right  Psychiatric Specialty Exam: Physical Exam  Constitutional: She appears well-developed.  Cardiovascular: Normal rate.   Neurological: She is alert.  Psychiatric: She has a normal mood and affect.    Review of Systems  Psychiatric/Behavioral: Positive for depression.    Blood pressure (!) 144/83, pulse (!) 146, temperature 98.6 F (37 C), resp. rate 16, height 5' 7.5" (1.715 m), weight 78.9 kg  (174 lb), SpO2 100 %.Body mass index is 26.85 kg/m.  General Appearance:  casual, agitated and irritate  Eye Contact:  Fair  Speech:  Clear, coherent, pressured  Volume:  Increased  Mood:  Angry, Dysphoric, Irritable and manic  Affect:  Labile  Thought Process:  Disorganized and Descriptions of Associations: Loose  Orientation: unable to assess  Thought Content:  Paranoid Ideation  Suicidal Thoughts:  Unable to  assess, patient is not cooperative.  Homicidal Thoughts:  Unable to assess, patient is not cooperative.  Memory:  Immediate;   Good Recent;   Good Remote;   Good  Judgement:  Impaired  Insight:  Lacking  Psychomotor Activity:  Restless, agitated, irritated  Concentration:  Concentration: Poor and Attention Span: Poor  Recall:  Good  Fund of Knowledge:  Limited  Language:  Good  Akathisia:  Negative  Handed:  Right  AIMS (if indicated):     Assets:  Communication Skills  ADL's:  Intact  Cognition:  WNL  Sleep:  Number of Hours: 2.5       I agree with current treatment plan on 03/11/2017, Patient seen face-to-face for psychiatric evaluation follow-up, chart reviewed. Reviewed the information documented and agree with the treatment plan.  Treatment Plan Summary: Patient remains with evidence of psychosis & of mania. We are continuing the forced medication order.     Psychiatric: Schizoaffective disorder, bipolar-type - unstable  Continue with current treatment plan 03/11/2017 except where noted  Medical: Will continue monitor for any symptoms & treat on a prn basis.  Psychosocial:  Unemployed  Divorced Will encourage group counseling attendance & participation.   Patient is currently on a forced medication order due to her current state of mind & refusal to take her medications regimen.   Continue current plan of care as recommended. (See MAR).  Called primary care consult for her iron deficiency anemia. See consult notes.   See order for Anemia panel &  fecal ocult test. Continue Ferrous sulfate 325 mg to TID as recommended the primary care hospitalist. Continue to monitor mood, behavior and interaction with peers    Derrill Center, NP 03/11/2017, 11:32 AM

## 2017-03-11 NOTE — [facility] Group Notes (Signed)
[facility] Group Notes: (Clinical Social Work)   03/11/2017      Type of Therapy:  Group Therapy   Participation Level:  Did Not Attend despite MHT prompting   Selmer Dominion, LCSW 03/11/2017, 1:21 PM

## 2017-03-11 NOTE — Plan of Care (Signed)
Problem: Safety: Goal: Periods of time without injury will increase Outcome: Progressing Pt safe on the unit at this time   

## 2017-03-11 NOTE — Progress Notes (Signed)
Patient ID: Valerie Howard, female   DOB: 1976/06/23, 41 y.o.   MRN: [id number]    D: Pt has been very agitated and irritable on the unit. She does not go to groups nor does she engage in treatment. Pt was very reluctant when taking her medication, she would cuss at staff at least 5 mins prior to taking medication. Then she would chew her medication and then tell staff to get the hell out of her room. Pt has no insight and is not vested in treatment. Pt reported being negative SI/HI, no AH/VH noted. A: 15 min checks continued for patient safety. R: Pt safety maintained.

## 2017-03-12 NOTE — Progress Notes (Signed)
Tried to talk to pt and pt stated information in prior note. Pt seen in dayroom at times, pt continues to be rude and disrespectful to everyone

## 2017-03-12 NOTE — Progress Notes (Signed)
Adult Psychoeducational Group Note Pt did not attend wrap-up group and remained in her room.

## 2017-03-12 NOTE — Plan of Care (Signed)
Problem: Activity: Goal: Interest or engagement in activities will improve Outcome: Not Progressing Refusing all activities and interactions  Problem: Education: Goal: Emotional status will improve Outcome: Not Progressing Angry, refuses to interact Goal: Verbalization of understanding the information provided will improve Outcome: Not Progressing Refusing all information  Problem: Coping: Goal: Ability to verbalize frustrations and anger appropriately will improve Outcome: Not Progressing Angry, refusing to talk, upon second question states "Didn't you hear what I said? I have no need for your services" Goal: Ability to demonstrate self-control will improve Outcome: Not Progressing No aggressive behavior today but verbally aggressive and refuses all services  Problem: Health Behavior/Discharge Planning: Goal: Compliance with treatment plan for underlying cause of condition will improve Outcome: Not Progressing Refuses all medications and interventions  Problem: Education: Goal: Will be free of psychotic symptoms Outcome: Not Progressing Unable to assess for hallucinations and delusions but patient is very angry, isolative, refusing any interaction or treatment  Problem: Role Relationship: Goal: Ability to interact with others will improve Outcome: Not Progressing No interaction with anyone in facility  Problem: Safety: Goal: Ability to redirect hostility and anger into socially appropriate behaviors will improve Outcome: Not Progressing Behavior is not socially appropriate, refuses all interactions

## 2017-03-12 NOTE — B[facility] Group Notes (Signed)
[facility] Group Notes: (Clinical Social Work)   03/12/2017      Type of Therapy:  Group Therapy   Participation Level:  Did Not Attend despite MHT prompting - she was on the phone at the beginning of group and when asked to join group stated that while others may come she was not going to, saying "I've met you before."   Selmer Dominion, LCSW 03/12/2017, 12:24 PM

## 2017-03-12 NOTE — Progress Notes (Signed)
[facility] MD Progress Note  03/12/2017 11:02 AM Valerie Howard  MRN:  [id number]  Subjective: patient seen sitting at the telephone with blank stare. Patient is not engaged with this discussion.   Objective:  Valerie Howard is awake and alert. Patient seen picking up the phone and hanging up. - patient was asked if she needed assistance to help dial a phone number. (patient walked off)  Patient continues to present with irritability  and agitated during this discussion. Patient guarded and verbally aggressive with answering "no" and "leave me alone." Patient to continue with  forced medication order.  Pending internal med consult.  Support, encouragement and reassurance was provided.   Principal Problem: Schizoaffective disorder, bipolar type ([place])  Diagnosis:   Patient Active Problem List   Diagnosis Date Noted  . Schizoaffective disorder, bipolar type ([place]) [F25.0]   . Suicidal ideation [R45.851]   . Menorrhagia [N92.0] 05/12/2014  . Personality disorder [F60.9] 10/17/2012  . Anemia, iron deficiency [D50.9] 10/13/2012  . Tachycardia [R00.0] 10/01/2012   Total Time spent with patient: 25 minutes  Past Psychiatric History: Schizoaffective disorder, Bipolar-type  Past Medical History:  Past Medical History:  Diagnosis Date  . Paranoid behavior ([place])   . Schizophrenia [facility])     Past Surgical History:  Procedure Laterality Date  . CESAREAN SECTION     Family History:  Family History  Problem Relation Age of Onset  . Hypertension Other   . Diabetes Other   . Cancer Other    Family Psychiatric  History: See H&P  Social History:  History  Alcohol Use  . Yes    Comment: socially     History  Drug Use No    Social History   Social History  . Marital status: Legally Separated    Spouse name: N/A  . Number of children: N/A  . Years of education: N/A   Social History Main Topics  . Smoking status: Never Smoker  . Smokeless tobacco: Never Used  . Alcohol use Yes   Comment: socially  . Drug use: No  . Sexual activity: No   Other Topics Concern  . None   Social History Narrative  . None   Additional Social History:   Sleep: Poor  Appetite:  Fair  Current Medications: Current Facility-Administered Medications  Medication Dose Route Frequency Provider Last Rate Last Dose  . acetaminophen (TYLENOL) tablet 650 mg  650 mg Oral Q4H PRN Patrecia Pour, NP      . alum & mag hydroxide-simeth (MAALOX/MYLANTA) 200-200-20 MG/5ML suspension 30 mL  30 mL Oral Q6H PRN Patrecia Pour, NP      . benztropine (COGENTIN) tablet 0.5 mg  0.5 mg Oral BID Cobos, Myer Peer, MD   0.5 mg at 03/11/17 1711   Or  . benztropine mesylate (COGENTIN) injection 0.5 mg  0.5 mg Intramuscular BID Cobos, Myer Peer, MD      . docusate sodium (COLACE) capsule 100 mg  100 mg Oral BID Lindell Spar I, NP   100 mg at 03/10/17 0900  . ferrous sulfate tablet 325 mg  325 mg Oral TID WC Nwoko, Agnes I, NP      . gabapentin (NEURONTIN) capsule 300 mg  300 mg Oral BID Patrecia Pour, NP   300 mg at 03/10/17 0900  . haloperidol (HALDOL) tablet 5 mg  5 mg Oral Q6H PRN Lindell Spar I, NP   5 mg at 03/09/17 2033   Or  . haloperidol lactate (HALDOL) injection  5 mg  5 mg Intramuscular Q6H PRN Nwoko, Agnes I, NP      . haloperidol (HALDOL) tablet 5 mg  5 mg Oral BID Cobos, Myer Peer, MD   5 mg at 03/11/17 1711   Or  . haloperidol lactate (HALDOL) injection 5 mg  5 mg Intramuscular BID Cobos, Fernando A, MD      . lisinopril (PRINIVIL,ZESTRIL) tablet 10 mg  10 mg Oral Daily Lindell Spar I, NP   10 mg at 03/10/17 0900  . lisinopril (PRINIVIL,ZESTRIL) tablet 20 mg  20 mg Oral Once Lindell Spar I, NP      . LORazepam (ATIVAN) tablet 1 mg  1 mg Oral Q6H PRN Lindell Spar I, NP   1 mg at 03/09/17 2033   Or  . LORazepam (ATIVAN) injection 1 mg  1 mg Intramuscular Q6H PRN Nwoko, Agnes I, NP      . LORazepam (ATIVAN) tablet 2 mg  2 mg Oral BID Cobos, Myer Peer, MD   2 mg at 03/11/17 1710   Or  .  LORazepam (ATIVAN) injection 2 mg  2 mg Intramuscular BID Cobos, Fernando A, MD      . magnesium hydroxide (MILK OF MAGNESIA) suspension 30 mL  30 mL Oral Daily PRN Patrecia Pour, NP      . nicotine (NICODERM CQ - dosed in mg/24 hours) patch 21 mg  21 mg Transdermal Daily Lord, Jamison Y, NP      . ondansetron [facility]) tablet 4 mg  4 mg Oral Q8H PRN Patrecia Pour, NP       Lab Results: No results found for this or any previous visit (from the past 48 hour(s)).  Blood Alcohol level:  Lab Results  Component Value Date   ETH <5 03/07/2017   ETH <5 37/16/9678   Metabolic Disorder Labs: No results found for: HGBA1C, MPG No results found for: PROLACTIN No results found for: CHOL, TRIG, HDL, CHOLHDL, VLDL, LDLCALC  Physical Findings: AIMS: Facial and Oral Movements Muscles of Facial Expression: None, normal Lips and Perioral Area: None, normal Jaw: None, normal Tongue: None, normal,Extremity Movements Upper (arms, wrists, hands, fingers): None, normal Lower (legs, knees, ankles, toes): None, normal, Trunk Movements Neck, shoulders, hips: None, normal, Overall Severity Severity of abnormal movements (highest score from questions above): None, normal Incapacitation due to abnormal movements: None, normal Patient's awareness of abnormal movements (rate only patient's report): No Awareness, Dental Status Current problems with teeth and/or dentures?: No Does patient usually wear dentures?: No  CIWA:    COWS:     Musculoskeletal: Strength & Muscle Tone: within normal limits Gait & Station: normal Patient leans: Right  Psychiatric Specialty Exam: Physical Exam  Constitutional: She appears well-developed.  Cardiovascular: Normal rate.   Neurological: She is alert.  Psychiatric: She has a normal mood and affect.    Review of Systems  Psychiatric/Behavioral: Positive for depression.    Blood pressure (!) 139/95, pulse (!) 118, temperature 98.9 F (37.2 C), temperature source  Oral, resp. rate 16, height 5' 7.5" (1.715 m), weight 78.9 kg (174 lb), SpO2 100 %.Body mass index is 26.85 kg/m.  General Appearance:  casual, agitated and irritate  Eye Contact:  Fair  Speech:  Clear, coherent, pressured  Volume:  Increased  Mood:  Angry, Dysphoric, Irritable and manic  Affect:  Labile  Thought Process:  Descriptions of Associations: Loose  Orientation: unable to assess  Thought Content:  Paranoid Ideation  Suicidal Thoughts:  Unable to assess, patient is  not cooperative.  Homicidal Thoughts:  Unable to assess, patient is not cooperative.  Memory:  Immediate;   Good Recent;   Good Remote;   Good  Judgement:  Impaired  Insight:  Lacking  Psychomotor Activity:  Restless, agitated, irritated  Concentration:  Concentration: Poor and Attention Span: Poor  Recall:  Good  Fund of Knowledge:  Limited  Language:  Good  Akathisia:  Negative  Handed:  Right  AIMS (if indicated):     Assets:  Communication Skills  ADL's:  Intact  Cognition:  WNL  Sleep:  Number of Hours: 2.5       I agree with current treatment plan on 03/12/2017, Patient seen face-to-face for psychiatric evaluation follow-up, chart reviewed. Reviewed the information documented and agree with the treatment plan.  Treatment Plan Summary: Patient remains with evidence of psychosis & of mania. We are continuing the forced medication order.     Psychiatric: Schizoaffective disorder, bipolar-type - unstable  Continue with current treatment plan 03/12/2017 except where noted  Medical: Will continue monitor for any symptoms & treat on a prn basis.  Psychosocial:  Unemployed  Divorced Will encourage group counseling attendance & participation.   Patient is currently on a forced medication order due to her current state of mind & refusal to take her medications regimen.   Continue current plan of care as recommended. (See MAR).  Called primary care consult for her iron deficiency anemia. See  consult notes.   See order for Anemia panel & fecal ocult test. Continue Ferrous sulfate 325 mg to TID as recommended the primary care hospitalist. Continue to monitor mood, behavior and interaction with peers    Derrill Center, NP 03/12/2017, 11:02 AM

## 2017-03-12 NOTE — Plan of Care (Signed)
Problem: Safety: Goal: Periods of time without injury will increase Outcome: Progressing Pt safe on the unit at this time   

## 2017-03-12 NOTE — [facility] Group Notes (Signed)
[facility] Group Notes:  (Nursing/MHT/Case Management/Adjunct)  Date:  03/12/2017  Time:  5:55 PM  Type of Therapy:  Nurse Education  Participation Level:  Did Not Attend  Aura Dials 03/12/2017, 5:55 PM

## 2017-03-12 NOTE — Progress Notes (Signed)
"  you need to get the fuck out of my face, worsome ass". Pt informed MHT that she wanted it put in her chart that she did not want this writer to talk to her " I don't have any fucking thing to say to him, fucking worrisome"

## 2017-03-13 NOTE — Progress Notes (Signed)
Pt continues to be labile, loud, rude and uncooperative. Pt very avoidant with Probation officer and most staff.

## 2017-03-13 NOTE — Progress Notes (Signed)
DAR NOTE: Patient presents with labile mood and irritable affect. Patient continues to refuse  medication but only takes forced medication orders po form.  Refused to participate in assessment.  Verbally aggressive toward staff.  Routine safety checks continues.  Support and encouragement offered as needed.

## 2017-03-13 NOTE — Progress Notes (Signed)
Adult Psychoeducational Group Note  Date:  03/13/2017 Time:  8:42 PM   Pt did not attend wrap-up group.

## 2017-03-13 NOTE — [facility] Group Notes (Signed)
[place] LCSW Group Therapy  03/13/2017 1:15 pm  Type of Therapy: Process Group Therapy  Participation Level:  Active  Participation Quality:  Appropriate  Affect:  Flat  Cognitive:  Oriented  Insight:  Improving  Engagement in Group:  Limited  Engagement in Therapy:  Limited  Modes of Intervention:  Activity, Clarification, Education, Problem-solving and Support  Summary of Progress/Problems: Today's group addressed the issue of overcoming obstacles.  Patients were asked to identify their biggest obstacle post d/c that stands in the way of their on-going success, and then problem solve as to how to manage this. Invited.  Chose to not attend.  Trish Mage 03/13/2017   3:16 PM

## 2017-03-13 NOTE — Progress Notes (Signed)
Recreation Therapy Notes  Date: 03/13/2017 Time: 10:00am Location: [address] Dayroom  Group Topic: Coping Skills and Self-Esteem  Goal Area(s) Addresses:  Pt will be able to identify traits about themselves that are unique. Pt will be able to identify coping skills for different emotions.  Intervention: Game  Activity: Cards. Pt will receive two cards and depending on the number they have they will have to answer the question or perform the action that has been previously identified by recreation therapy intern. The question pertain to coping skills and self-esteem.  Education: Radiographer, therapeutic, Self-Esteem, Discharge Planning  Education Outcome: Acknowledges understanding   Clinical Observations/Feedback: Pt did not attend group.  Donovan Kail, Recreation Therapy Intern

## 2017-03-13 NOTE — Progress Notes (Signed)
[facility] MD Progress Note  03/13/2017 10:20 AM Valerie Howard  MRN:  [id number]   Subjective: Valerie Howard seen pacing the unit, responding to internal stimuli.  Patient walked away from this discussion. Continues to present with a guarded and flat affect. Patient is still irritable and verbally aggressive. Will continue with forced medications order which was clarified in treatment team for the nursing staff.  Staff reports patient continues to refused vitals and Po medications. Support, encouragement and reassurance was provided.  Principal Problem: Schizoaffective disorder, bipolar type ([place]) Diagnosis:   Patient Active Problem List   Diagnosis Date Noted  . Schizoaffective disorder, bipolar type ([place]) [F25.0]   . Suicidal ideation [R45.851]   . Menorrhagia [N92.0] 05/12/2014  . Personality disorder [F60.9] 10/17/2012  . Anemia, iron deficiency [D50.9] 10/13/2012  . Tachycardia [R00.0] 10/01/2012   Total Time spent with patient: 15 minutes  Past Psychiatric History:  Past Medical History:  Past Medical History:  Diagnosis Date  . Paranoid behavior ([place])   . Schizophrenia [facility])     Past Surgical History:  Procedure Laterality Date  . CESAREAN SECTION     Family History:  Family History  Problem Relation Age of Onset  . Hypertension Other   . Diabetes Other   . Cancer Other    Family Psychiatric  History:  Social History:  History  Alcohol Use  . Yes    Comment: socially     History  Drug Use No    Social History   Social History  . Marital status: Legally Separated    Spouse name: N/A  . Number of children: N/A  . Years of education: N/A   Social History Main Topics  . Smoking status: Never Smoker  . Smokeless tobacco: Never Used  . Alcohol use Yes     Comment: socially  . Drug use: No  . Sexual activity: No   Other Topics Concern  . None   Social History Narrative  . None   Additional Social History:                         Sleep:  Fair  Appetite:  Fair per staffing notes  Current Medications: Current Facility-Administered Medications  Medication Dose Route Frequency Provider Last Rate Last Dose  . acetaminophen (TYLENOL) tablet 650 mg  650 mg Oral Q4H PRN Patrecia Pour, NP      . alum & mag hydroxide-simeth (MAALOX/MYLANTA) 200-200-20 MG/5ML suspension 30 mL  30 mL Oral Q6H PRN Patrecia Pour, NP      . benztropine (COGENTIN) tablet 0.5 mg  0.5 mg Oral BID Cobos, Myer Peer, MD   0.5 mg at 03/13/17 1478   Or  . benztropine mesylate (COGENTIN) injection 0.5 mg  0.5 mg Intramuscular BID Cobos, Myer Peer, MD      . docusate sodium (COLACE) capsule 100 mg  100 mg Oral BID Lindell Spar I, NP   100 mg at 03/10/17 0900  . ferrous sulfate tablet 325 mg  325 mg Oral TID WC Nwoko, Agnes I, NP      . gabapentin (NEURONTIN) capsule 300 mg  300 mg Oral BID Patrecia Pour, NP   300 mg at 03/10/17 0900  . haloperidol (HALDOL) tablet 5 mg  5 mg Oral Q6H PRN Lindell Spar I, NP   5 mg at 03/09/17 2033   Or  . haloperidol lactate (HALDOL) injection 5 mg  5 mg Intramuscular Q6H PRN Encarnacion Slates,  NP      . haloperidol (HALDOL) tablet 5 mg  5 mg Oral BID Cobos, Myer Peer, MD   5 mg at 03/13/17 9622   Or  . haloperidol lactate (HALDOL) injection 5 mg  5 mg Intramuscular BID Cobos, Fernando A, MD      . lisinopril (PRINIVIL,ZESTRIL) tablet 10 mg  10 mg Oral Daily Lindell Spar I, NP   10 mg at 03/10/17 0900  . lisinopril (PRINIVIL,ZESTRIL) tablet 20 mg  20 mg Oral Once Lindell Spar I, NP      . LORazepam (ATIVAN) tablet 1 mg  1 mg Oral Q6H PRN Lindell Spar I, NP   1 mg at 03/09/17 2033   Or  . LORazepam (ATIVAN) injection 1 mg  1 mg Intramuscular Q6H PRN Nwoko, Agnes I, NP      . LORazepam (ATIVAN) tablet 2 mg  2 mg Oral BID Cobos, Myer Peer, MD   2 mg at 03/13/17 2979   Or  . LORazepam (ATIVAN) injection 2 mg  2 mg Intramuscular BID Cobos, Fernando A, MD      . magnesium hydroxide (MILK OF MAGNESIA) suspension 30 mL  30 mL Oral  Daily PRN Patrecia Pour, NP      . nicotine (NICODERM CQ - dosed in mg/24 hours) patch 21 mg  21 mg Transdermal Daily Lord, Jamison Y, NP      . ondansetron [facility]) tablet 4 mg  4 mg Oral Q8H PRN Patrecia Pour, NP        Lab Results: No results found for this or any previous visit (from the past 48 hour(s)).  Blood Alcohol level:  Lab Results  Component Value Date   ETH <5 03/07/2017   ETH <5 89/21/1943    Metabolic Disorder Labs: No results found for: HGBA1C, MPG No results found for: PROLACTIN No results found for: CHOL, TRIG, HDL, CHOLHDL, VLDL, LDLCALC  Physical Findings: AIMS: Facial and Oral Movements Muscles of Facial Expression: None, normal Lips and Perioral Area: None, normal Jaw: None, normal Tongue: None, normal,Extremity Movements Upper (arms, wrists, hands, fingers): None, normal Lower (legs, knees, ankles, toes): None, normal, Trunk Movements Neck, shoulders, hips: None, normal, Overall Severity Severity of abnormal movements (highest score from questions above): None, normal Incapacitation due to abnormal movements: None, normal Patient's awareness of abnormal movements (rate only patient's report): No Awareness, Dental Status Current problems with teeth and/or dentures?: No Does patient usually wear dentures?: No  CIWA:    COWS:     Musculoskeletal: Strength & Muscle Tone: within normal limits Gait & Station: normal Patient leans: N/A  Psychiatric Specialty Exam: Physical Exam  Vitals reviewed. Neurological: She is alert.  Skin: Skin is warm.  Psychiatric: She has a normal mood and affect.    Review of Systems  Psychiatric/Behavioral: Positive for hallucinations. The patient is nervous/anxious.     Blood pressure (!) 149/88, pulse (!) 115, temperature 98.9 F (37.2 C), temperature source Oral, resp. rate 16, height 5' 7.5" (1.715 m), weight 78.9 kg (174 lb), SpO2 100 %.Body mass index is 26.85 kg/m.  General Appearance: casual contines with  irriatablity and agiagation  Eye Contact:  Fair  Speech:  Pressured  Volume:  Normal with fluctuation   Mood:  Anxious, Dysphoric and Irritable  Affect:  Depressed, Flat and Labile  Thought Process:  Descriptions of Associations: Loose  Orientation:  Other:  UTA patient is not cooperactive with discussion  Thought Content:  NA  Suicidal Thoughts:  UTA  Homicidal Thoughts:  N/A  Memory:  NA  Judgement:  Poor  Insight:  Lacking  Psychomotor Activity:  Restlessness  Concentration:  Concentration: Poor  Recall:  Poor  Fund of Knowledge:  Poor  Language:  Good  Akathisia:  No  Handed:  Right  AIMS (if indicated):     Assets:  Communication Skills  ADL's:  Intact  Cognition:  WNL  Sleep:  Number of Hours: 5     I agree with current treatment plan on 03/13/2017, Patient seen face-to-face for psychiatric evaluation follow-up, chart reviewed. Reviewed the information documented and agree with the treatment plan.  Treatment Plan Summary: Daily contact with patient to assess and evaluate symptoms and progress in treatment and Medication management  Psychiatric: Schizoaffective disorder, bipolar-type - unstable  Continue with current treatment plan 03/13/2017 except where noted  Medical: Will continue monitor for any symptoms & treat on a prn basis.  Patient is currently on a forced medication order due to her current state of mind & refusal to take her medications regimen.   Continue current plan of care as recommended. (See MAR).  Called primary care consult for her iron deficiency anemia. See consult notes.   See order for Anemia panel & fecal ocult test. Continue Ferrous sulfate 325 mg to TID as recommended the primary care hospitalist. Continue to monitor mood, behavior and interaction with peers    Derrill Center, NP 03/13/2017, 10:20 AM

## 2017-03-14 MED ORDER — HALOPERIDOL LACTATE 5 MG/ML IJ SOLN
10.0000 mg | Freq: Two times a day (BID) | INTRAMUSCULAR | Status: DC
  Filled 2017-03-14 (×18): qty 2

## 2017-03-14 MED ORDER — HALOPERIDOL 5 MG PO TABS
10.0000 mg | ORAL_TABLET | Freq: Two times a day (BID) | ORAL | Status: DC
  Administered 2017-03-14 – 2017-03-21 (×14): 10 mg via ORAL
  Filled 2017-03-14 (×19): qty 2

## 2017-03-14 NOTE — Progress Notes (Signed)
Adult Psychoeducational Group Note  Date:  03/14/2017 Time:  9:14 PM  Group Topic/Focus:  Wrap-Up Group:   The focus of this group is to help patients review their daily goal of treatment and discuss progress on daily workbooks.  Participation Level:  Did Not Attend  Additional Comments:  Pt did not attend wrap-up group.   Valerie Howard 03/14/2017, 9:14 PM

## 2017-03-14 NOTE — Progress Notes (Signed)
[facility] MD Progress Note  03/14/2017 2:59 PM Valerie Howard  MRN:  [id number]   Subjective: Valerie Howard reports, "I'm not answering any questions today. Please, don't come in to my room".    Objective: Valerie Howard seen, chart reviewed. She is lying down in her bed. She remains verbally aggressive & uncooperative. She appears to be responding to internal stimuli. She is not making any eye contact. She continues to present with a guarded and flat affect. She remains irritable & dyphoric. She remains on forced medications order which was clarified in treatment team for the nursing staff. Staff reports patient continues to refused vitals and PO medications. She is visible on the unit from time to time, but not attending or participating in the group sessions. Support, encouragement and reassurance was provided.  Principal Problem: Schizoaffective disorder, bipolar type ([place]) Diagnosis:   Patient Active Problem List   Diagnosis Date Noted  . Schizoaffective disorder, bipolar type ([place]) [F25.0]   . Suicidal ideation [R45.851]   . Menorrhagia [N92.0] 05/12/2014  . Personality disorder [F60.9] 10/17/2012  . Anemia, iron deficiency [D50.9] 10/13/2012  . Tachycardia [R00.0] 10/01/2012   Total Time spent with patient: 15 minutes  Past Psychiatric History: Schizoaffective disorder, Bipolar-type.  Past Medical History:  Past Medical History:  Diagnosis Date  . Paranoid behavior ([place])   . Schizophrenia [facility])     Past Surgical History:  Procedure Laterality Date  . CESAREAN SECTION     Family History:  Family History  Problem Relation Age of Onset  . Hypertension Other   . Diabetes Other   . Cancer Other    Family Psychiatric  History:   Social History:  History  Alcohol Use  . Yes    Comment: socially     History  Drug Use No    Social History   Social History  . Marital status: Legally Separated    Spouse name: N/A  . Number of children: N/A  . Years of education: N/A   Social  History Main Topics  . Smoking status: Never Smoker  . Smokeless tobacco: Never Used  . Alcohol use Yes     Comment: socially  . Drug use: No  . Sexual activity: No   Other Topics Concern  . None   Social History Narrative  . None   Additional Social History:   Sleep: Fair  Appetite:  Fair per staffing notes  Current Medications: Current Facility-Administered Medications  Medication Dose Route Frequency Provider Last Rate Last Dose  . acetaminophen (TYLENOL) tablet 650 mg  650 mg Oral Q4H PRN Patrecia Pour, NP      . alum & mag hydroxide-simeth (MAALOX/MYLANTA) 200-200-20 MG/5ML suspension 30 mL  30 mL Oral Q6H PRN Patrecia Pour, NP      . benztropine (COGENTIN) tablet 0.5 mg  0.5 mg Oral BID Cobos, Myer Peer, MD   0.5 mg at 03/14/17 7096   Or  . benztropine mesylate (COGENTIN) injection 0.5 mg  0.5 mg Intramuscular BID Cobos, Myer Peer, MD      . docusate sodium (COLACE) capsule 100 mg  100 mg Oral BID Lindell Spar I, NP   100 mg at 03/10/17 0900  . ferrous sulfate tablet 325 mg  325 mg Oral TID WC Duquan Gillooly I, NP      . gabapentin (NEURONTIN) capsule 300 mg  300 mg Oral BID Patrecia Pour, NP   300 mg at 03/10/17 0900  . haloperidol lactate (HALDOL) injection 10 mg  10  mg Intramuscular BID Hampton Abbot, MD       Or  . haloperidol (HALDOL) tablet 10 mg  10 mg Oral BID Hampton Abbot, MD      . haloperidol (HALDOL) tablet 5 mg  5 mg Oral Q6H PRN Lindell Spar I, NP   5 mg at 03/09/17 2033   Or  . haloperidol lactate (HALDOL) injection 5 mg  5 mg Intramuscular Q6H PRN Lindell Spar I, NP      . lisinopril (PRINIVIL,ZESTRIL) tablet 10 mg  10 mg Oral Daily Lindell Spar I, NP   10 mg at 03/10/17 0900  . lisinopril (PRINIVIL,ZESTRIL) tablet 20 mg  20 mg Oral Once Lindell Spar I, NP      . LORazepam (ATIVAN) tablet 1 mg  1 mg Oral Q6H PRN Lindell Spar I, NP   1 mg at 03/09/17 2033   Or  . LORazepam (ATIVAN) injection 1 mg  1 mg Intramuscular Q6H PRN Dakia Schifano I, NP       . LORazepam (ATIVAN) tablet 2 mg  2 mg Oral BID Cobos, Myer Peer, MD   2 mg at 03/14/17 0825   Or  . LORazepam (ATIVAN) injection 2 mg  2 mg Intramuscular BID Cobos, Fernando A, MD      . magnesium hydroxide (MILK OF MAGNESIA) suspension 30 mL  30 mL Oral Daily PRN Patrecia Pour, NP      . nicotine (NICODERM CQ - dosed in mg/24 hours) patch 21 mg  21 mg Transdermal Daily Lord, Jamison Y, NP      . ondansetron [facility]) tablet 4 mg  4 mg Oral Q8H PRN Patrecia Pour, NP        Lab Results: No results found for this or any previous visit (from the past 48 hour(s)).  Blood Alcohol level:  Lab Results  Component Value Date   ETH <5 03/07/2017   ETH <5 18/29/9371   Metabolic Disorder Labs: No results found for: HGBA1C, MPG No results found for: PROLACTIN No results found for: CHOL, TRIG, HDL, CHOLHDL, VLDL, LDLCALC  Physical Findings: AIMS: Facial and Oral Movements Muscles of Facial Expression: None, normal Lips and Perioral Area: None, normal Jaw: None, normal Tongue: None, normal,Extremity Movements Upper (arms, wrists, hands, fingers): None, normal Lower (legs, knees, ankles, toes): None, normal, Trunk Movements Neck, shoulders, hips: None, normal, Overall Severity Severity of abnormal movements (highest score from questions above): None, normal Incapacitation due to abnormal movements: None, normal Patient's awareness of abnormal movements (rate only patient's report): No Awareness, Dental Status Current problems with teeth and/or dentures?: No Does patient usually wear dentures?: No  CIWA:    COWS:     Musculoskeletal: Strength & Muscle Tone: within normal limits Gait & Station: normal Patient leans: N/A  Psychiatric Specialty Exam: Physical Exam  Vitals reviewed. Neurological: She is alert.  Skin: Skin is warm.  Psychiatric: She has a normal mood and affect.    Review of Systems  Psychiatric/Behavioral: Positive for hallucinations. The patient is  nervous/anxious.     Blood pressure 126/78, pulse (!) 130, temperature 98.6 F (37 C), resp. rate 16, height 5' 7.5" (1.715 m), weight 78.9 kg (174 lb), SpO2 100 %.Body mass index is 26.85 kg/m.  General Appearance: casual contines with irriatablity and agiagation  Eye Contact:  Fair  Speech:  Pressured  Volume:  Normal with fluctuation   Mood:  Anxious, Dysphoric and Irritable  Affect:  Depressed, Flat and Labile  Thought Process:  Descriptions of Associations:  Loose  Orientation:  Other:  UTA patient is not cooperactive with discussion  Thought Content:  NA  Suicidal Thoughts:  UTA  Homicidal Thoughts:  N/A  Memory:  NA  Judgement:  Poor  Insight:  Lacking  Psychomotor Activity:  Restlessness  Concentration:  Concentration: Poor  Recall:  Poor  Fund of Knowledge:  Poor  Language:  Good  Akathisia:  No  Handed:  Right  AIMS (if indicated):     Assets:  Communication Skills  ADL's:  Intact  Cognition:  WNL  Sleep:  Number of Hours: 6.75   Treatment Plan Summary: Daily contact with patient to assess and evaluate symptoms and progress in treatment and Medication management  Psychiatric: Schizoaffective disorder, bipolar-type - Unstable  Continue with current treatment plan 03/13/2017 except where noted  Medical: Will continue monitor for any symptoms & treat on a prn basis.  Patient is currently on a forced medication order due to her current state of mind & refusal to take her medications regimen.   Continue current plan of care as recommended. (See MAR).  Called primary care consult for her iron deficiency anemia. See consult notes.   See order for Anemia panel & fecal ocult test (Patient refused for the samples to be collected). Continue Ferrous sulfate 325 mg to TID as recommended by the primary care hospitalist. Continue to monitor mood, behavior and interaction with peers  Encarnacion Slates, NP, PMHNP, FNP-BC 03/14/2017, 2:59 PMPatient ID: Lorenda Hatchet,  female   DOB: Jul 05, 1976, 41 y.o.   MRN: [id number]

## 2017-03-14 NOTE — BHH Group Notes (Signed)
[place] LCSW Group Therapy  03/14/2017 3:13 PM   Type of Therapy:  Group Therapy  Participation Level:  Active  Participation Quality:  Attentive  Affect:  Appropriate  Cognitive:  Appropriate  Insight:  Improving  Engagement in Therapy:  Engaged  Modes of Intervention:  Clarification, Education, Exploration and Socialization  Summary of Progress/Problems: Today's group focused on relapse prevention.  We defined the term, and then brainstormed on ways to prevent relapse. Invited.  Chose to not attend.  Roque Lias B 03/14/2017 , 3:13 PM

## 2017-03-14 NOTE — Progress Notes (Signed)
Recreation Therapy Notes Date: 03/14/2017 Time: 10:00am Location: [address] Dayroom   Group Topic: Decision Making  Goal Area(s) Addresses:  Pts will successfully identify how their choices affect other people in their lives. Pts will successfully identify how to overcome an obstacle they may face when someone makes a poor choice around them.  Intervention: Game  Activity: Pts will work in teams to create as many words they can with the letters they chose. Pts will pick letters independently to use with their team for the first round and for the secound round pts will pick letters as a team.  Education: Decision Making, Discharge Planning  Education Outcome: Acknowledges understanding  Clinical Observations/Feedback: Pt did not attend group.  Donovan Kail, Recreation Therapy Intern  Victorino Sparrow, LRT/CTRS

## 2017-03-14 NOTE — Progress Notes (Signed)
DAR NOTE Pt present with flat affect and depressed mood. Pt continues to labile, irritable and verbally abusive towards staff. Pt continue to refuse her medications, but only take forced medications that are PO. Refused to fill in the self inventory form and will not answer questions from staff. Pt maintained on q 15 min checks, support and encouragement offered as needed, will continue to monitor.

## 2017-03-15 NOTE — Progress Notes (Signed)
  DATA ACTION RESPONSE  Objective- Pt. is visible in the room, seen resting in bed with eyes open. Presents with an irritable/agitated/angry    affect and mood. No further c/o. Pt. continues to remain paranoid and suspicious of staff.  Subjective- Denies having any SI/HI/AVH/Pain at this time.Pt. states " Leave me alone".Is cooperative and remain safe on the unit.  1:1 interaction in private to establish rapport. Encouragement, education, & support given from staff.  No meds scheduled at bedtime.   Safety maintained with Q 15 checks. Continue with POC.

## 2017-03-15 NOTE — Tx Team (Signed)
Interdisciplinary Treatment and Diagnostic Plan Update  03/15/2017 Time of Session: 8:47 AM  JACOBY RITSEMA MRN: [id number]  Principal Diagnosis: Schizoaffective disorder, bipolar type ([place])  Secondary Diagnoses: Principal Problem:   Schizoaffective disorder, bipolar type ([place]) Active Problems:   Anemia, iron deficiency   Menorrhagia   Current Medications:  Current Facility-Administered Medications  Medication Dose Route Frequency Provider Last Rate Last Dose  . acetaminophen (TYLENOL) tablet 650 mg  650 mg Oral Q4H PRN Patrecia Pour, NP      . alum & mag hydroxide-simeth (MAALOX/MYLANTA) 200-200-20 MG/5ML suspension 30 mL  30 mL Oral Q6H PRN Patrecia Pour, NP      . benztropine (COGENTIN) tablet 0.5 mg  0.5 mg Oral BID Cobos, Myer Peer, MD   0.5 mg at 03/15/17 6286   Or  . benztropine mesylate (COGENTIN) injection 0.5 mg  0.5 mg Intramuscular BID Cobos, Myer Peer, MD      . docusate sodium (COLACE) capsule 100 mg  100 mg Oral BID Lindell Spar I, NP   100 mg at 03/10/17 0900  . ferrous sulfate tablet 325 mg  325 mg Oral TID WC Nwoko, Agnes I, NP      . gabapentin (NEURONTIN) capsule 300 mg  300 mg Oral BID Patrecia Pour, NP   300 mg at 03/10/17 0900  . haloperidol lactate (HALDOL) injection 10 mg  10 mg Intramuscular BID Hampton Abbot, MD       Or  . haloperidol (HALDOL) tablet 10 mg  10 mg Oral BID Hampton Abbot, MD   10 mg at 03/15/17 3817  . haloperidol (HALDOL) tablet 5 mg  5 mg Oral Q6H PRN Lindell Spar I, NP   5 mg at 03/09/17 2033   Or  . haloperidol lactate (HALDOL) injection 5 mg  5 mg Intramuscular Q6H PRN Lindell Spar I, NP      . lisinopril (PRINIVIL,ZESTRIL) tablet 10 mg  10 mg Oral Daily Lindell Spar I, NP   10 mg at 03/10/17 0900  . lisinopril (PRINIVIL,ZESTRIL) tablet 20 mg  20 mg Oral Once Lindell Spar I, NP      . LORazepam (ATIVAN) tablet 1 mg  1 mg Oral Q6H PRN Lindell Spar I, NP   1 mg at 03/09/17 2033   Or  . LORazepam (ATIVAN) injection 1 mg  1 mg  Intramuscular Q6H PRN Nwoko, Agnes I, NP      . LORazepam (ATIVAN) tablet 2 mg  2 mg Oral BID Cobos, Myer Peer, MD   2 mg at 03/14/17 1721   Or  . LORazepam (ATIVAN) injection 2 mg  2 mg Intramuscular BID Cobos, Fernando A, MD      . magnesium hydroxide (MILK OF MAGNESIA) suspension 30 mL  30 mL Oral Daily PRN Patrecia Pour, NP      . nicotine (NICODERM CQ - dosed in mg/24 hours) patch 21 mg  21 mg Transdermal Daily Lord, Jamison Y, NP      . ondansetron [facility]) tablet 4 mg  4 mg Oral Q8H PRN Patrecia Pour, NP        PTA Medications: Prescriptions Prior to Admission  Medication Sig Dispense Refill Last Dose  . OLANZapine (ZYPREXA) 10 MG tablet Take 10 mg by mouth 2 (two) times daily.   March 2017 at unknown time    Patient Stressors: Medication change or noncompliance  Patient Strengths: Average or above average intelligence Capable of independent living Physical Health  Treatment Modalities: Medication Management, Group  therapy, Case management,  1 to 1 session with clinician, Psychoeducation, Recreational therapy.   Physician Treatment Plan for Primary Diagnosis: Schizoaffective disorder, bipolar type ([place]) Long Term Goal(s): Improvement in symptoms so as ready for discharge  Short Term Goals: Ability to identify changes in lifestyle to reduce recurrence of condition will improve Ability to verbalize feelings will improve Ability to demonstrate self-control will improve Ability to identify and develop effective coping behaviors will improve Compliance with prescribed medications will improve  Medication Management: Evaluate patient's response, side effects, and tolerance of medication regimen.  Therapeutic Interventions: 1 to 1 sessions, Unit Group sessions and Medication administration.  Evaluation of Outcomes: Progressing   7/18: Patient is currently on a forced medication order due to her current state of mind &refusal to take her medications regimen. She is  willing to take Cogentin and Haldol orally BID only because she knows she will get an injection if she doesn't.  Furthermore, she is refusing all labs, blood pressure checks, etc.  Physician Treatment Plan for Secondary Diagnosis: Principal Problem:   Schizoaffective disorder, bipolar type ([place]) Active Problems:   Anemia, iron deficiency   Menorrhagia   Long Term Goal(s): Improvement in symptoms so as ready for discharge  Short Term Goals: Ability to identify changes in lifestyle to reduce recurrence of condition will improve Ability to verbalize feelings will improve Ability to demonstrate self-control will improve Ability to identify and develop effective coping behaviors will improve Compliance with prescribed medications will improve  Medication Management: Evaluate patient's response, side effects, and tolerance of medication regimen.  Therapeutic Interventions: 1 to 1 sessions, Unit Group sessions and Medication administration.  Evaluation of Outcomes: Progressing   RN Treatment Plan for Primary Diagnosis: Schizoaffective disorder, bipolar type ([place]) Long Term Goal(s): Knowledge of disease and therapeutic regimen to maintain health will improve  Short Term Goals: Ability to identify and develop effective coping behaviors will improve and Compliance with prescribed medications will improve  Medication Management: RN will administer medications as ordered by provider, will assess and evaluate patient's response and provide education to patient for prescribed medication. RN will report any adverse and/or side effects to prescribing provider.  Therapeutic Interventions: 1 on 1 counseling sessions, Psychoeducation, Medication administration, Evaluate responses to treatment, Monitor vital signs and CBGs as ordered, Perform/monitor CIWA, COWS, AIMS and Fall Risk screenings as ordered, Perform wound care treatments as ordered.  Evaluation of Outcomes: Progressing   LCSW Treatment  Plan for Primary Diagnosis: Schizoaffective disorder, bipolar type ([place]) Long Term Goal(s): Safe transition to appropriate next level of care at discharge, Engage patient in therapeutic group addressing interpersonal concerns.  Short Term Goals: Engage patient in aftercare planning with referrals and resources  Therapeutic Interventions: Assess for all discharge needs, 1 to 1 time with Social worker, Explore available resources and support systems, Assess for adequacy in community support network, Educate family and significant other(s) on suicide prevention, Complete Psychosocial Assessment, Interpersonal group therapy.  Evaluation of Outcomes: Not Met  Pt is refusing services post d/c   Progress in Treatment: Attending groups: Yes Participating in groups: Yes Taking medication as prescribed: Yes Toleration medication: Yes, no side effects reported at this time Family/Significant other contact made:  Patient understands diagnosis: Yes AEB Discussing patient identified problems/goals with staff: Yes Medical problems stabilized or resolved: Yes Denies suicidal/homicidal ideation: Yes Issues/concerns per patient self-inventory: None Other: N/A  New problem(s) identified: None identified at this time.   New Short Term/Long Term Goal(s): None identified at this time.  Discharge Plan or Barriers:    Reason for Continuation of Hospitalization: Disorganization Mood instability Paranoia Medication stabilization   Estimated Length of Stay: 5 days  Attendees: Patient: 03/15/2017  8:47 AM  Physician: Hampton Abbot, MD 03/15/2017  8:47 AM  Nursing: Sena Hitch, RN 03/15/2017  8:47 AM  RN Care Manager: Lars Pinks, RN 03/15/2017  8:47 AM  Social Worker: Ripley Fraise 03/15/2017  8:47 AM  Recreational Therapist: Winfield Cunas 03/15/2017  8:47 AM  Other: Norberto Sorenson 03/15/2017  8:47 AM  Other:  03/15/2017  8:47 AM    Scribe for Treatment Team:  Roque Lias LCSW 03/15/2017  8:47 AM

## 2017-03-15 NOTE — Progress Notes (Signed)
Recreation Therapy Notes   Date: 03/15/2017 Time: 10:00am Location: [address] Dayroom  Group Topic: Problem-Solving  Goal Area(s) Addresses:  Pts will successfully identify problems they are currently facing. Pts will successfully offer solutions to anonymous problems.  Intervention:  Pencils Printer Paper  Activity: Pts were asked to write down a current problem they are trying to solve. Pts were then given other peoples papers and asked to offer solutions for someone else problem.  Education: Problem-Solving, Discharge Planning  Education Outcome: Acknowledges understanding  Clinical Observations/Feedback: Pt did not attend group.  Donovan Kail, Recreation Therapy Intern  Victorino Sparrow, LRT/CTRS

## 2017-03-15 NOTE — Progress Notes (Signed)
Psychoeducational Group Note  Date:  03/15/2017 Time: 2103  Group Topic/Focus:  Wrap-Up Group:   The focus of this group is to help patients review their daily goal of treatment and discuss progress on daily workbooks.  Participation Level: Did Not Attend  Participation Quality:  Not Applicable  Affect:  Not Applicable  Cognitive:  Not Applicable  Insight:  Not Applicable  Engagement in Group: Not Applicable  Additional Comments:  The patient did not attend group.   Archie Balboa S 03/15/2017, 9:03 PM

## 2017-03-15 NOTE — Progress Notes (Signed)
Patient ID: Valerie Howard, female   DOB: [birth year], 41 y.o.   MRN: [id number] D  ---  Pt denies pain and agrees to contract for safety, but may not be able to comprehend the concept.  She remains hostile, verbally aggressive and threatening to staff.  She refused all her AM medications  EXCEPT for Haldol and Cogentin .[name] Dr is aware .  Pt appears to be suspicious and paranoid of other people and demands to be allowed to go home.  Pt uses  F-bomb toward staff and other types of name calling and abusive language.  --- A ---  Provide support and safety  --- R ---  Pt remains safe , but is monitored closely for safety

## 2017-03-15 NOTE — Progress Notes (Signed)
Pt became very loud, verbally aggressive to the RN after RN introduced self to Pt; "who the F are you; F you and F away from me." Pt went on and on about this to other patients and others on the phone." Pt denied pain, depression, anxiety, SI or HI; states, "don't come here and ask me all that Fing questions about depression and pain and the other question and NO I am not." Support, encouragement, and safe environment provided. 15-minute safety checks continue.

## 2017-03-15 NOTE — Progress Notes (Signed)
[facility] MD Progress Note  03/15/2017 2:27 PM Valerie Howard  MRN:  [id number]   Subjective: Valerie Howard reports, "I will be doing just fine when I'm released from this place, thank you".   Objective: Valerie Howard is seen, chart reviewed. She is sitting down in the day room silently. She kept to herself. Was noticed talking on the phone with someone, acted civil, no yelling, no outburst. However, she remains verbally aggressive & uncooperative when approached. She appears to be responding to internal stimuli. She made a brief eye contact today with the provider. She continues to present with a guarded and flat affect. She remains irritable & dyphoric. She remains on forced medications order. Staff reports patient continues to refuse vitals & some of her medications this morning (The antihypertensive medication). She did take her Haldol tablet this am. She is visible on the unit from time to time. Support, encouragement and reassurance was provided.  Principal Problem: Schizoaffective disorder, bipolar type ([place]) Diagnosis:   Patient Active Problem List   Diagnosis Date Noted  . Schizoaffective disorder, bipolar type ([place]) [F25.0]   . Suicidal ideation [R45.851]   . Menorrhagia [N92.0] 05/12/2014  . Personality disorder [F60.9] 10/17/2012  . Anemia, iron deficiency [D50.9] 10/13/2012  . Tachycardia [R00.0] 10/01/2012   Total Time spent with patient: 15 minutes  Past Psychiatric History: Schizoaffective disorder, Bipolar-type.  Past Medical History:  Past Medical History:  Diagnosis Date  . Paranoid behavior ([place])   . Schizophrenia [facility])     Past Surgical History:  Procedure Laterality Date  . CESAREAN SECTION     Family History:  Family History  Problem Relation Age of Onset  . Hypertension Other   . Diabetes Other   . Cancer Other    Family Psychiatric  History:   Social History:  History  Alcohol Use  . Yes    Comment: socially     History  Drug Use No    Social History   Social  History  . Marital status: Legally Separated    Spouse name: N/A  . Number of children: N/A  . Years of education: N/A   Social History Main Topics  . Smoking status: Never Smoker  . Smokeless tobacco: Never Used  . Alcohol use Yes     Comment: socially  . Drug use: No  . Sexual activity: No   Other Topics Concern  . None   Social History Narrative  . None   Additional Social History:   Sleep: Fair  Appetite:  Fair per staffing notes  Current Medications: Current Facility-Administered Medications  Medication Dose Route Frequency Provider Last Rate Last Dose  . acetaminophen (TYLENOL) tablet 650 mg  650 mg Oral Q4H PRN Patrecia Pour, NP      . alum & mag hydroxide-simeth (MAALOX/MYLANTA) 200-200-20 MG/5ML suspension 30 mL  30 mL Oral Q6H PRN Patrecia Pour, NP      . benztropine (COGENTIN) tablet 0.5 mg  0.5 mg Oral BID Cobos, Myer Peer, MD   0.5 mg at 03/15/17 0630   Or  . benztropine mesylate (COGENTIN) injection 0.5 mg  0.5 mg Intramuscular BID Cobos, Myer Peer, MD      . docusate sodium (COLACE) capsule 100 mg  100 mg Oral BID Lindell Spar I, NP   100 mg at 03/10/17 0900  . ferrous sulfate tablet 325 mg  325 mg Oral TID WC Nwoko, Agnes I, NP      . gabapentin (NEURONTIN) capsule 300 mg  300 mg  Oral BID Patrecia Pour, NP   300 mg at 03/10/17 0900  . haloperidol lactate (HALDOL) injection 10 mg  10 mg Intramuscular BID Hampton Abbot, MD       Or  . haloperidol (HALDOL) tablet 10 mg  10 mg Oral BID Hampton Abbot, MD   10 mg at 03/15/17 4403  . haloperidol (HALDOL) tablet 5 mg  5 mg Oral Q6H PRN Lindell Spar I, NP   5 mg at 03/09/17 2033   Or  . haloperidol lactate (HALDOL) injection 5 mg  5 mg Intramuscular Q6H PRN Lindell Spar I, NP      . lisinopril (PRINIVIL,ZESTRIL) tablet 10 mg  10 mg Oral Daily Lindell Spar I, NP   10 mg at 03/10/17 0900  . lisinopril (PRINIVIL,ZESTRIL) tablet 20 mg  20 mg Oral Once Lindell Spar I, NP      . LORazepam (ATIVAN) tablet 1 mg   1 mg Oral Q6H PRN Lindell Spar I, NP   1 mg at 03/09/17 2033   Or  . LORazepam (ATIVAN) injection 1 mg  1 mg Intramuscular Q6H PRN Nwoko, Agnes I, NP      . LORazepam (ATIVAN) tablet 2 mg  2 mg Oral BID Cobos, Myer Peer, MD   2 mg at 03/14/17 1721   Or  . LORazepam (ATIVAN) injection 2 mg  2 mg Intramuscular BID Cobos, Fernando A, MD      . magnesium hydroxide (MILK OF MAGNESIA) suspension 30 mL  30 mL Oral Daily PRN Patrecia Pour, NP      . nicotine (NICODERM CQ - dosed in mg/24 hours) patch 21 mg  21 mg Transdermal Daily Lord, Jamison Y, NP      . ondansetron [facility]) tablet 4 mg  4 mg Oral Q8H PRN Patrecia Pour, NP        Lab Results: No results found for this or any previous visit (from the past 48 hour(s)).  Blood Alcohol level:  Lab Results  Component Value Date   ETH <5 03/07/2017   ETH <5 47/42/5956   Metabolic Disorder Labs: No results found for: HGBA1C, MPG No results found for: PROLACTIN No results found for: CHOL, TRIG, HDL, CHOLHDL, VLDL, LDLCALC  Physical Findings: AIMS: Facial and Oral Movements Muscles of Facial Expression: None, normal Lips and Perioral Area: None, normal Jaw: None, normal Tongue: None, normal,Extremity Movements Upper (arms, wrists, hands, fingers): None, normal Lower (legs, knees, ankles, toes): None, normal, Trunk Movements Neck, shoulders, hips: None, normal, Overall Severity Severity of abnormal movements (highest score from questions above): None, normal Incapacitation due to abnormal movements: None, normal Patient's awareness of abnormal movements (rate only patient's report): No Awareness, Dental Status Current problems with teeth and/or dentures?: No Does patient usually wear dentures?: No  CIWA:    COWS:     Musculoskeletal: Strength & Muscle Tone: within normal limits Gait & Station: normal Patient leans: N/A  Psychiatric Specialty Exam: Physical Exam  Vitals reviewed. Neurological: She is alert.  Skin: Skin is warm.   Psychiatric: She has a normal mood and affect.    Review of Systems  Psychiatric/Behavioral: Positive for hallucinations. The patient is nervous/anxious.     Blood pressure (!) 146/88, pulse (!) 122, temperature 98.7 F (37.1 C), temperature source Oral, resp. rate 18, height 5' 7.5" (1.715 m), weight 78.9 kg (174 lb), SpO2 100 %.Body mass index is 26.85 kg/m.  General Appearance: casual contines with irriatablity and agiagation  Eye Contact:  Fair  Speech:  Pressured  Volume:  Normal with fluctuation   Mood:  Anxious, Dysphoric and Irritable  Affect:  Depressed, Flat and Labile  Thought Process:  Descriptions of Associations: Loose  Orientation:  Other:  UTA patient is not cooperactive with discussion  Thought Content:  NA  Suicidal Thoughts:  UTA  Homicidal Thoughts:  N/A  Memory:  NA  Judgement:  Poor  Insight:  Lacking  Psychomotor Activity:  Restlessness  Concentration:  Concentration: Poor  Recall:  Poor  Fund of Knowledge:  Poor  Language:  Good  Akathisia:  No  Handed:  Right  AIMS (if indicated):     Assets:  Communication Skills  ADL's:  Intact  Cognition:  WNL  Sleep:  Number of Hours: 6.75   Treatment Plan Summary: Daily contact with patient to assess and evaluate symptoms and progress in treatment and Medication management  Psychiatric: Schizoaffective disorder, bipolar-type - Unstable  Continue with current treatment plan 03/15/2017 except where noted  Medical: Patient refused her blood pressure medication this morning. Will continue to monitor for any new symptoms.  Patient is currently on a forced medication order due to her current state of mind & refusal to take her medications regimen.   Continue current plan of care as recommended. (See MAR).  Already called primary care consult for her iron deficiency anemia. See consult notes.   See order for Anemia panel & fecal ocult test (Patient refused for the samples to be collected). Continue  Ferrous sulfate 325 mg to TID as recommended by the primary care hospitalist. Continue to monitor mood, behavior and interaction with peers  Encarnacion Slates, NP, PMHNP, FNP-BC 03/15/2017, 2:27 PMPatient ID: Valerie Howard, female   DOB: 1976-01-22, 41 y.o.   MRN: [id number]

## 2017-03-16 NOTE — Progress Notes (Signed)
Patient did not attend karaoke.

## 2017-03-16 NOTE — Progress Notes (Signed)
   DATA ACTION RESPONSE  Objective- Pt. is visible in the room, seen resting in bed with eyes open. Presents with an irritable/agitated/angry affect and mood. No further c/o. Pt. continues to remain paranoid and suspicious of staff.  Subjective- Denies having any SI/HI/AVH/Pain at this time.Pt. states " I'm fine, shut the door".Is cooperative and remain safe on the unit.  1:1 interaction in private to establish rapport. Encouragement, education, & support given from staff. No meds scheduled at bedtime.   Safety maintained with Q 15 checks. Continue with POC.

## 2017-03-16 NOTE — Plan of Care (Signed)
Problem: Activity: Goal: Interest or engagement in activities will improve Outcome: Not Progressing Pt. did not attended karaoke provided with encouragement.

## 2017-03-16 NOTE — Progress Notes (Signed)
[facility] MD Progress Note  03/16/2017 1:03 PM Valerie Howard  MRN:  [id number]   Subjective: Valerie Howard reports, "I'm doing fine thank you. When am I getting out of here?".  Objective: Valerie Howard is seen, chart reviewed. She is lying down in her bed, alone in her room. Her back was facing the door. She is still keeping to herself. She is verbally responsive & aggressive. She remains uncooperative when approached by the staff. She appears to be responding to internal stimuli. She did not try to make eye contact today with the provider. She continues to present with a guarded and flat affect. She remains irritable & dyphoric. She remains on forced medications order. Staff reports patient continues to refuse vitals & some of her medications this morning (The antihypertensive medication). She did take her Haldol tablet again this am. She is visible on the unit from time to time. Support, encouragement and reassurance was provided.  Principal Problem: Schizoaffective disorder, bipolar type ([place]) Diagnosis:   Patient Active Problem List   Diagnosis Date Noted  . Schizoaffective disorder, bipolar type ([place]) [F25.0]   . Suicidal ideation [R45.851]   . Menorrhagia [N92.0] 05/12/2014  . Personality disorder [F60.9] 10/17/2012  . Anemia, iron deficiency [D50.9] 10/13/2012  . Tachycardia [R00.0] 10/01/2012   Total Time spent with patient: 15 minutes  Past Psychiatric History: Schizoaffective disorder, Bipolar-type.  Past Medical History:  Past Medical History:  Diagnosis Date  . Paranoid behavior ([place])   . Schizophrenia [facility])     Past Surgical History:  Procedure Laterality Date  . CESAREAN SECTION     Family History:  Family History  Problem Relation Age of Onset  . Hypertension Other   . Diabetes Other   . Cancer Other    Family Psychiatric  History:   Social History:  History  Alcohol Use  . Yes    Comment: socially     History  Drug Use No    Social History   Social History  . Marital  status: Legally Separated    Spouse name: N/A  . Number of children: N/A  . Years of education: N/A   Social History Main Topics  . Smoking status: Never Smoker  . Smokeless tobacco: Never Used  . Alcohol use Yes     Comment: socially  . Drug use: No  . Sexual activity: No   Other Topics Concern  . None   Social History Narrative  . None   Additional Social History:   Sleep: Fair  Appetite:  Fair per staffing notes  Current Medications: Current Facility-Administered Medications  Medication Dose Route Frequency Provider Last Rate Last Dose  . acetaminophen (TYLENOL) tablet 650 mg  650 mg Oral Q4H PRN Patrecia Pour, NP      . alum & mag hydroxide-simeth (MAALOX/MYLANTA) 200-200-20 MG/5ML suspension 30 mL  30 mL Oral Q6H PRN Patrecia Pour, NP      . benztropine (COGENTIN) tablet 0.5 mg  0.5 mg Oral BID Cobos, Myer Peer, MD   0.5 mg at 03/16/17 2229   Or  . benztropine mesylate (COGENTIN) injection 0.5 mg  0.5 mg Intramuscular BID Cobos, Myer Peer, MD      . docusate sodium (COLACE) capsule 100 mg  100 mg Oral BID Lindell Spar I, NP   100 mg at 03/10/17 0900  . ferrous sulfate tablet 325 mg  325 mg Oral TID WC Nwoko, Agnes I, NP      . gabapentin (NEURONTIN) capsule 300 mg  300  mg Oral BID Patrecia Pour, NP   300 mg at 03/10/17 0900  . haloperidol lactate (HALDOL) injection 10 mg  10 mg Intramuscular BID Hampton Abbot, MD       Or  . haloperidol (HALDOL) tablet 10 mg  10 mg Oral BID Hampton Abbot, MD   10 mg at 03/16/17 6578  . haloperidol (HALDOL) tablet 5 mg  5 mg Oral Q6H PRN Lindell Spar I, NP   5 mg at 03/09/17 2033   Or  . haloperidol lactate (HALDOL) injection 5 mg  5 mg Intramuscular Q6H PRN Lindell Spar I, NP      . lisinopril (PRINIVIL,ZESTRIL) tablet 10 mg  10 mg Oral Daily Lindell Spar I, NP   10 mg at 03/16/17 4696  . lisinopril (PRINIVIL,ZESTRIL) tablet 20 mg  20 mg Oral Once Lindell Spar I, NP      . LORazepam (ATIVAN) tablet 1 mg  1 mg Oral Q6H PRN  Lindell Spar I, NP   1 mg at 03/09/17 2033   Or  . LORazepam (ATIVAN) injection 1 mg  1 mg Intramuscular Q6H PRN Nwoko, Agnes I, NP      . LORazepam (ATIVAN) tablet 2 mg  2 mg Oral BID Cobos, Myer Peer, MD   2 mg at 03/16/17 0841   Or  . LORazepam (ATIVAN) injection 2 mg  2 mg Intramuscular BID Cobos, Fernando A, MD      . magnesium hydroxide (MILK OF MAGNESIA) suspension 30 mL  30 mL Oral Daily PRN Patrecia Pour, NP      . nicotine (NICODERM CQ - dosed in mg/24 hours) patch 21 mg  21 mg Transdermal Daily Lord, Jamison Y, NP      . ondansetron [facility]) tablet 4 mg  4 mg Oral Q8H PRN Patrecia Pour, NP        Lab Results: No results found for this or any previous visit (from the past 48 hour(s)).  Blood Alcohol level:  Lab Results  Component Value Date   ETH <5 03/07/2017   ETH <5 29/52/8413   Metabolic Disorder Labs: No results found for: HGBA1C, MPG No results found for: PROLACTIN No results found for: CHOL, TRIG, HDL, CHOLHDL, VLDL, LDLCALC  Physical Findings: AIMS: Facial and Oral Movements Muscles of Facial Expression: None, normal Lips and Perioral Area: None, normal Jaw: None, normal Tongue: None, normal,Extremity Movements Upper (arms, wrists, hands, fingers): None, normal Lower (legs, knees, ankles, toes): None, normal, Trunk Movements Neck, shoulders, hips: None, normal, Overall Severity Severity of abnormal movements (highest score from questions above): None, normal Incapacitation due to abnormal movements: None, normal Patient's awareness of abnormal movements (rate only patient's report): No Awareness, Dental Status Current problems with teeth and/or dentures?: No Does patient usually wear dentures?: No  CIWA:    COWS:     Musculoskeletal: Strength & Muscle Tone: within normal limits Gait & Station: normal Patient leans: N/A  Psychiatric Specialty Exam: Physical Exam  Vitals reviewed. Neurological: She is alert.  Skin: Skin is warm.  Psychiatric:  She has a normal mood and affect.    Review of Systems  Psychiatric/Behavioral: Positive for depression and hallucinations (Continue to present with psychosis, paranoia & delusional). Negative for substance abuse and suicidal ideas. The patient is nervous/anxious.     Blood pressure 126/87, pulse (!) 101, temperature 98.9 F (37.2 C), resp. rate 16, height 5' 7.5" (1.715 m), weight 78.9 kg (174 lb), SpO2 100 %.Body mass index is 26.85 kg/m.  General  Appearance: casual contines with irriatablity and agiagation  Eye Contact:  Fair  Speech:  Pressured  Volume:  Normal with fluctuation   Mood:  Anxious, Dysphoric and Irritable  Affect:  Depressed, Flat and Labile  Thought Process:  Descriptions of Associations: Loose  Orientation:  Other:  UTA patient is not cooperactive with discussion  Thought Content:  NA  Suicidal Thoughts:  UTA  Homicidal Thoughts:  N/A  Memory:  NA  Judgement:  Poor  Insight:  Lacking  Psychomotor Activity:  Restlessness  Concentration:  Concentration: Poor  Recall:  Poor  Fund of Knowledge:  Poor  Language:  Good  Akathisia:  No  Handed:  Right  AIMS (if indicated):     Assets:  Communication Skills  ADL's:  Intact  Cognition:  WNL  Sleep:  Number of Hours: 6.75   Treatment Plan Summary: Daily contact with patient to assess and evaluate symptoms and progress in treatment and Medication management  Psychiatric: Schizoaffective disorder, bipolar-type - Unstable  Continue with current treatment plan 03/16/2017 except where noted  Medical: Patient refused her blood pressure medication this morning. Will continue to monitor for any new symptoms.  Patient is currently on a forced medication order due to her current state of mind & refusal to take her medications regimen.   Continue current plan of care as recommended. (See MAR).  Already called primary care consult for her iron deficiency anemia. See consult notes.   See order for Anemia panel &  fecal ocult test (Patient refused for the samples to be collected). Continue Ferrous sulfate 325 mg to TID as recommended by the primary care hospitalist. Continue to monitor mood, behavior and interaction with peers  Encarnacion Slates, NP, PMHNP, FNP-BC 03/16/2017, 1:03 PMPatient ID: Valerie Howard, female   DOB: 17-Dec-1975, 41 y.o.   MRN: [id number]

## 2017-03-16 NOTE — Progress Notes (Signed)
Recreation Therapy Notes  Date: 03/16/2017 Time: 10:00am Location: [address] Dayroom  Group Topic: Leisure Education  Goal Area(s) Addresses:  Pt will successfully identify three positive leisure activities. Pt will successfully identify benefits of participating in leisure activities.  Intervention: Game  Activity: Pts will work together to identify leisure activities that begin with each letter of the alphabet.  Education: Leisure Education, Dentist  Education Outcome: Acknowledges understanding   Clinical Observations/Feedback: Pt did not attend group.  Donovan Kail, Recreation Therapy Intern  Victorino Sparrow, LRT/CTRS

## 2017-03-16 NOTE — Progress Notes (Signed)
Patient refused to talk with Probation officer.  Patient remains irritable, agitated, and angry.  Patient refuses somatic medications this shift, but was compliant with psychiatric medications.    Assess patient for safety, offer medications as prescribed, engage in 1:1 staff talks.   Patient able to contract for safety.

## 2017-03-16 NOTE — [facility] Group Notes (Signed)
[facility] [employer] Group Therapy  03/16/2017 , 1:20 PM    Type of Therapy:  Mental Health Association Presentation  Participation Level:  Active  Participation Quality:  Attentive  Affect:  Blunted  Cognitive:  Oriented  Insight:  Limited  Engagement in Therapy:  Engaged  Modes of Intervention:  Discussion, Education and Socialization  Summary of Progress/Problems:  Valerie Howard from [employer] came to present his recovery story and play the guitar.  Invited. Chose to not attend.  Roque Lias B 03/16/2017 , 1:20 PM

## 2017-03-17 DIAGNOSIS — R443 Hallucinations, unspecified: Secondary | ICD-10-CM

## 2017-03-17 DIAGNOSIS — F250 Schizoaffective disorder, bipolar type: Principal | ICD-10-CM

## 2017-03-17 NOTE — Progress Notes (Signed)
D:Pt is irritable and tangential in her room. Pt refused all medication this morning. After much encouragement she took her psch medications and blood pressure medication. Pt chewed them and refused water. She is suspicious and making sexual remarks.  A:Offered support and 15 minute checks. R:Pt answered "no" when asked about si and hi thoughts. She did this while glaring at Probation officer.

## 2017-03-17 NOTE — Progress Notes (Signed)
[facility] MD Progress Note  03/17/2017 11:25 AM MADELLYN DENIO  MRN:  [id number]   Objective: Valerie Howard is seen resting in bed. Reports " its okay to lay upside down isn't? States this reminds me of sleeping in a  hammock. Patient less guarded on approach today. She appears to be responding to internal stimuli.She continues to present with a guarded, irritated and flat affect. She remains irritable & dysphoric.  Charlynn to continue on forced medications order. Staff reports patient continues to refuse vitals & some of her medications this morning (The antihypertensive medication).  Nursing staff reports patient is taken PO medications. States she is chewing her medications without water due her paroniria. Support, encouragement and reassurance was provided.  Principal Problem: Schizoaffective disorder, bipolar type ([place]) Diagnosis:   Patient Active Problem List   Diagnosis Date Noted  . Schizoaffective disorder, bipolar type ([place]) [F25.0]   . Suicidal ideation [R45.851]   . Menorrhagia [N92.0] 05/12/2014  . Personality disorder [F60.9] 10/17/2012  . Anemia, iron deficiency [D50.9] 10/13/2012  . Tachycardia [R00.0] 10/01/2012   Total Time spent with patient: 15 minutes  Past Psychiatric History: Schizoaffective disorder, Bipolar-type.  Past Medical History:  Past Medical History:  Diagnosis Date  . Paranoid behavior ([place])   . Schizophrenia [name])     Past Surgical History:  Procedure Laterality Date  . CESAREAN SECTION     Family History:  Family History  Problem Relation Age of Onset  . Hypertension Other   . Diabetes Other   . Cancer Other    Family Psychiatric  History:   Social History:  History  Alcohol Use  . Yes    Comment: socially     History  Drug Use No    Social History   Social History  . Marital status: Legally Separated    Spouse name: N/A  . Number of children: N/A  . Years of education: N/A   Social History Main Topics  . Smoking status: Never Smoker   . Smokeless tobacco: Never Used  . Alcohol use Yes     Comment: socially  . Drug use: No  . Sexual activity: No   Other Topics Concern  . None   Social History Narrative  . None   Additional Social History:   Sleep: Fair  Appetite:  Fair per staffing notes  Current Medications: Current Facility-Administered Medications  Medication Dose Route Frequency Provider Last Rate Last Dose  . acetaminophen (TYLENOL) tablet 650 mg  650 mg Oral Q4H PRN Patrecia Pour, NP      . alum & mag hydroxide-simeth (MAALOX/MYLANTA) 200-200-20 MG/5ML suspension 30 mL  30 mL Oral Q6H PRN Patrecia Pour, NP      . benztropine (COGENTIN) tablet 0.5 mg  0.5 mg Oral BID Cobos, Myer Peer, MD   0.5 mg at 03/17/17 0757   Or  . benztropine mesylate (COGENTIN) injection 0.5 mg  0.5 mg Intramuscular BID Cobos, Myer Peer, MD      . docusate sodium (COLACE) capsule 100 mg  100 mg Oral BID Lindell Spar I, NP   100 mg at 03/10/17 0900  . ferrous sulfate tablet 325 mg  325 mg Oral TID WC Nwoko, Agnes I, NP      . gabapentin (NEURONTIN) capsule 300 mg  300 mg Oral BID Patrecia Pour, NP   300 mg at 03/10/17 0900  . haloperidol lactate (HALDOL) injection 10 mg  10 mg Intramuscular BID Hampton Abbot, MD  Or  . haloperidol (HALDOL) tablet 10 mg  10 mg Oral BID Hampton Abbot, MD   10 mg at 03/17/17 0755  . haloperidol (HALDOL) tablet 5 mg  5 mg Oral Q6H PRN Lindell Spar I, NP   5 mg at 03/09/17 2033   Or  . haloperidol lactate (HALDOL) injection 5 mg  5 mg Intramuscular Q6H PRN Lindell Spar I, NP      . lisinopril (PRINIVIL,ZESTRIL) tablet 10 mg  10 mg Oral Daily Lindell Spar I, NP   10 mg at 03/17/17 0755  . lisinopril (PRINIVIL,ZESTRIL) tablet 20 mg  20 mg Oral Once Lindell Spar I, NP      . LORazepam (ATIVAN) tablet 1 mg  1 mg Oral Q6H PRN Lindell Spar I, NP   1 mg at 03/09/17 2033   Or  . LORazepam (ATIVAN) injection 1 mg  1 mg Intramuscular Q6H PRN Nwoko, Agnes I, NP      . LORazepam (ATIVAN) tablet 2  mg  2 mg Oral BID Cobos, Myer Peer, MD   2 mg at 03/17/17 0755   Or  . LORazepam (ATIVAN) injection 2 mg  2 mg Intramuscular BID Cobos, Fernando A, MD      . magnesium hydroxide (MILK OF MAGNESIA) suspension 30 mL  30 mL Oral Daily PRN Patrecia Pour, NP      . nicotine (NICODERM CQ - dosed in mg/24 hours) patch 21 mg  21 mg Transdermal Daily Lord, Jamison Y, NP      . ondansetron [facility]) tablet 4 mg  4 mg Oral Q8H PRN Patrecia Pour, NP        Lab Results: No results found for this or any previous visit (from the past 48 hour(s)).  Blood Alcohol level:  Lab Results  Component Value Date   ETH <5 03/07/2017   ETH <5 67/07/4579   Metabolic Disorder Labs: No results found for: HGBA1C, MPG No results found for: PROLACTIN No results found for: CHOL, TRIG, HDL, CHOLHDL, VLDL, LDLCALC  Physical Findings: AIMS: Facial and Oral Movements Muscles of Facial Expression: None, normal Lips and Perioral Area: None, normal Jaw: None, normal Tongue: None, normal,Extremity Movements Upper (arms, wrists, hands, fingers): None, normal Lower (legs, knees, ankles, toes): None, normal, Trunk Movements Neck, shoulders, hips: None, normal, Overall Severity Severity of abnormal movements (highest score from questions above): None, normal Incapacitation due to abnormal movements: None, normal Patient's awareness of abnormal movements (rate only patient's report): No Awareness, Dental Status Current problems with teeth and/or dentures?: No Does patient usually wear dentures?: No  CIWA:    COWS:     Musculoskeletal: Strength & Muscle Tone: within normal limits Gait & Station: normal Patient leans: N/A  Psychiatric Specialty Exam: Physical Exam  Vitals reviewed. Neurological: She is alert.  Skin: Skin is warm.  Psychiatric: She has a normal mood and affect.    Review of Systems  Psychiatric/Behavioral: Positive for depression and hallucinations (Continue to present with psychosis, paranoia  & delusional). Negative for substance abuse and suicidal ideas. The patient is nervous/anxious.     Blood pressure 108/84, pulse (!) 113, temperature 98.6 F (37 C), temperature source Oral, resp. rate 16, height 5' 7.5" (1.715 m), weight 78.9 kg (174 lb), SpO2 100 %.Body mass index is 26.85 kg/m.  General Appearance: casual contines with irriatablity and agiagation  Eye Contact:  Fair  Speech:  Pressured  Volume:  Normal with fluctuation   Mood:  Anxious, Dysphoric and Irritable  Affect:  Depressed, Flat and Labile  Thought Process:  Descriptions of Associations: Loose  Orientation:  Other:  UTA patient is not cooperactive with discussion  Thought Content:  NA  Suicidal Thoughts:  UTA  Homicidal Thoughts:  N/A  Memory:  NA  Judgement:  Poor  Insight:  Lacking  Psychomotor Activity:  Restlessness  Concentration:  Concentration: Poor  Recall:  Poor  Fund of Knowledge:  Poor  Language:  Good  Akathisia:  No  Handed:  Right  AIMS (if indicated):     Assets:  Communication Skills  ADL's:  Intact  Cognition:  WNL  Sleep:  Number of Hours: 5.75    I agree with current treatment plan on 03/17/2017, Patient seen face-to-face for psychiatric evaluation follow-up, chart reviewed and case discussed with the MD Dwyane Dee, Advanced Practice Providers and Treatment team. Reviewed the information documented and agree with the treatment plan.  Treatment Plan Summary: Daily contact with patient to assess and evaluate symptoms and progress in treatment and Medication management  Psychiatric: Schizoaffective disorder, bipolar-type - Unstable  Continue with current treatment plan 03/17/2017 except where noted  Medical: Patient refused her blood pressure medication this morning. Will continue to monitor for any new symptoms.  Patient is currently on a forced medication order due to her current state of mind & refusal to take her medications regimen.   Continue current plan of care as  recommended. (See MAR).  Already called primary care consult for her iron deficiency anemia. See consult notes.   See order for Anemia panel & fecal ocult test (Patient refused for the samples to be collected). Continue Ferrous sulfate 325 mg to TID as recommended by the primary care hospitalist. Continue to monitor mood, behavior and interaction with peers  Derrill Center, NP, 03/17/2017, 11:25 AM

## 2017-03-17 NOTE — BHH Group Notes (Signed)
[place] LCSW Group Therapy  03/17/2017  1:05 PM  Type of Therapy:  Group therapy  Participation Level:  Active  Participation Quality:  Attentive  Affect:  Flat  Cognitive:  Oriented  Insight:  Limited  Engagement in Therapy:  Limited  Modes of Intervention:  Discussion, Socialization  Summary of Progress/Problems:  Chaplain was here to lead a group on themes of hope and courage. Invited.  Chose to not attend.  Roque Lias B 03/17/2017 1:12 PM

## 2017-03-17 NOTE — Progress Notes (Signed)
Recreation Therapy Notes  Date: 03/17/2017 Time: 10:00am Location: [address] Dayroom  Group Topic: Communication and Team-Building  Goal Area(s) Addresses:  Pts will successfully communicate with team members. Pts will successfully work with team members.  Intervention: STEM Activity  Activity: Pts were split into two teams of three. Each team received 20 plastic drinking straws and approximately 24 inches of masking tape. Pts were instructed to build a free-standing bridge with the material they were given that could hold a hardcover book.  Education: Communication, Warden/ranger, Discharge Planning  Education Outcome: Acknowledges understanding  Clinical Observations/Feedback: : Pt did not attend group.  Donovan Kail, Recreation Therapy   Victorino Sparrow, LRT/CTRS

## 2017-03-17 NOTE — Progress Notes (Signed)
Pt called 911 and EMS came to the hospital. She admitted to the mental health tech that she called 911. Pt continues to be irritable and curses staff.

## 2017-03-17 NOTE — Progress Notes (Signed)
Did not attend group 

## 2017-03-17 NOTE — Plan of Care (Signed)
Problem: Safety: Goal: Periods of time without injury will increase Outcome: Progressing Pt. denies SI/HI at this time and remains a low fall risk.

## 2017-03-17 NOTE — Progress Notes (Signed)
  DATA ACTION RESPONSE  Objective-Pt. is visible in the room, seen resting in bed with eyes open. Presents with an irritable/agitated/angryaffect and mood. No further c/o. Pt. continues to remain paranoid and suspicious of staff.  Subjective-Denies having any SI/HI/AVH/Pain at this time.Is cooperative and remain safe on the unit.  1:1 interaction in private to establish rapport. Encouragement, education, &support given from staff. No meds scheduled at bedtime.  Safety maintained with Q 15 checks. Continue with POC.

## 2017-03-17 NOTE — Plan of Care (Signed)
Problem: Activity: Goal: Interest or engagement in activities will improve Outcome: Not Progressing Pt has minimal interaction and does not participate in groups

## 2017-03-18 MED ORDER — DIVALPROEX SODIUM ER 500 MG PO TB24
750.0000 mg | ORAL_TABLET | Freq: Every day | ORAL | Status: DC
  Administered 2017-03-18: 750 mg via ORAL
  Filled 2017-03-18 (×4): qty 1

## 2017-03-18 NOTE — Progress Notes (Signed)
Did not attend group 

## 2017-03-18 NOTE — Progress Notes (Signed)
Patient refuses to speak directly to this nurse, instead talks to other staff to relay her words. Sarcastic and angry. Stood staring at tech who asked her if she was looking for her laundry and she replied "Well I'm not trying to get your phone number". Refused medical medications but did take psychotropic medications. No interaction with peers, does not participate in any activities. Isolative.

## 2017-03-18 NOTE — BHH Group Notes (Signed)
[place] Group Notes: (Clinical Social Work)   03/18/2017      Type of Therapy:  Group Therapy   Participation Level:  Did Not Attend despite MHT prompting   Selmer Dominion, LCSW 03/18/2017, 1:01 PM

## 2017-03-18 NOTE — BHH Group Notes (Signed)
Nursing Psychoeducational group - Coping skills. Patient did not attend, despite MHT and RN prompting.

## 2017-03-18 NOTE — Progress Notes (Signed)
[facility] MD Progress Note  03/18/2017 3:02 PM Valerie Howard  MRN:  [id number]    Subjective:  Valerie Howard reports " Y'alls little plan didn't work."  NP questioned further- Patient reports "  I don't want to go to [employer], y'all trying to send me there. I want to go home. I cant go back to [place]. So now what?  Objective: Valerie Howard is seen sitting in the credenza picking and hanging up the telephone repetitively. Patient less guarded on approach today.She continues to present with a guarded, irritated and flat affect.  Valerie Howard to continue on forced medications order.  Staffing notes patient continues to refuse vitals & some of her medications this morning. Support, encouragement and reassurance was provided.  Principal Problem: Schizoaffective disorder, bipolar type ([place]) Diagnosis:   Patient Active Problem List   Diagnosis Date Noted  . Schizoaffective disorder, bipolar type ([place]) [F25.0]   . Suicidal ideation [R45.851]   . Menorrhagia [N92.0] 05/12/2014  . Personality disorder [F60.9] 10/17/2012  . Anemia, iron deficiency [D50.9] 10/13/2012  . Tachycardia [R00.0] 10/01/2012   Total Time spent with patient: 15 minutes  Past Psychiatric History: Schizoaffective disorder, Bipolar-type.  Past Medical History:  Past Medical History:  Diagnosis Date  . Paranoid behavior ([place])   . Schizophrenia [facility])     Past Surgical History:  Procedure Laterality Date  . CESAREAN SECTION     Family History:  Family History  Problem Relation Age of Onset  . Hypertension Other   . Diabetes Other   . Cancer Other    Family Psychiatric  History:   Social History:  History  Alcohol Use  . Yes    Comment: socially     History  Drug Use No    Social History   Social History  . Marital status: Legally Separated    Spouse name: N/A  . Number of children: N/A  . Years of education: N/A   Social History Main Topics  . Smoking status: Never Smoker  . Smokeless tobacco: Never Used  . Alcohol  use Yes     Comment: socially  . Drug use: No  . Sexual activity: No   Other Topics Concern  . None   Social History Narrative  . None   Additional Social History:   Sleep: Fair  Appetite:  Fair per staffing notes  Current Medications: Current Facility-Administered Medications  Medication Dose Route Frequency Provider Last Rate Last Dose  . acetaminophen (TYLENOL) tablet 650 mg  650 mg Oral Q4H PRN Patrecia Pour, NP      . alum & mag hydroxide-simeth (MAALOX/MYLANTA) 200-200-20 MG/5ML suspension 30 mL  30 mL Oral Q6H PRN Patrecia Pour, NP      . benztropine (COGENTIN) tablet 0.5 mg  0.5 mg Oral BID Cobos, Myer Peer, MD   0.5 mg at 03/18/17 9476   Or  . benztropine mesylate (COGENTIN) injection 0.5 mg  0.5 mg Intramuscular BID Cobos, Myer Peer, MD      . docusate sodium (COLACE) capsule 100 mg  100 mg Oral BID Lindell Spar I, NP   100 mg at 03/10/17 0900  . ferrous sulfate tablet 325 mg  325 mg Oral TID WC Nwoko, Agnes I, NP      . gabapentin (NEURONTIN) capsule 300 mg  300 mg Oral BID Patrecia Pour, NP   300 mg at 03/10/17 0900  . haloperidol lactate (HALDOL) injection 10 mg  10 mg Intramuscular BID Hampton Abbot, MD  Or  . haloperidol (HALDOL) tablet 10 mg  10 mg Oral BID Hampton Abbot, MD   10 mg at 03/18/17 0856  . haloperidol (HALDOL) tablet 5 mg  5 mg Oral Q6H PRN Lindell Spar I, NP   5 mg at 03/09/17 2033   Or  . haloperidol lactate (HALDOL) injection 5 mg  5 mg Intramuscular Q6H PRN Lindell Spar I, NP      . lisinopril (PRINIVIL,ZESTRIL) tablet 10 mg  10 mg Oral Daily Lindell Spar I, NP   10 mg at 03/18/17 0858  . lisinopril (PRINIVIL,ZESTRIL) tablet 20 mg  20 mg Oral Once Lindell Spar I, NP      . LORazepam (ATIVAN) tablet 1 mg  1 mg Oral Q6H PRN Lindell Spar I, NP   1 mg at 03/09/17 2033   Or  . LORazepam (ATIVAN) injection 1 mg  1 mg Intramuscular Q6H PRN Nwoko, Agnes I, NP      . LORazepam (ATIVAN) tablet 2 mg  2 mg Oral BID Cobos, Myer Peer, MD   2  mg at 03/18/17 0858   Or  . LORazepam (ATIVAN) injection 2 mg  2 mg Intramuscular BID Cobos, Fernando A, MD      . magnesium hydroxide (MILK OF MAGNESIA) suspension 30 mL  30 mL Oral Daily PRN Patrecia Pour, NP      . nicotine (NICODERM CQ - dosed in mg/24 hours) patch 21 mg  21 mg Transdermal Daily Lord, Jamison Y, NP      . ondansetron [facility]) tablet 4 mg  4 mg Oral Q8H PRN Patrecia Pour, NP        Lab Results: No results found for this or any previous visit (from the past 48 hour(s)).  Blood Alcohol level:  Lab Results  Component Value Date   ETH <5 03/07/2017   ETH <5 27/78/2423   Metabolic Disorder Labs: No results found for: HGBA1C, MPG No results found for: PROLACTIN No results found for: CHOL, TRIG, HDL, CHOLHDL, VLDL, LDLCALC  Physical Findings: AIMS: Facial and Oral Movements Muscles of Facial Expression: None, normal Lips and Perioral Area: None, normal Jaw: None, normal Tongue: None, normal,Extremity Movements Upper (arms, wrists, hands, fingers): None, normal Lower (legs, knees, ankles, toes): None, normal, Trunk Movements Neck, shoulders, hips: None, normal, Overall Severity Severity of abnormal movements (highest score from questions above): None, normal Incapacitation due to abnormal movements: None, normal Patient's awareness of abnormal movements (rate only patient's report): No Awareness, Dental Status Current problems with teeth and/or dentures?: No Does patient usually wear dentures?: No  CIWA:    COWS:     Musculoskeletal: Strength & Muscle Tone: within normal limits Gait & Station: normal Patient leans: N/A  Psychiatric Specialty Exam: Physical Exam  Vitals reviewed. Constitutional: She appears well-developed.  Cardiovascular: Normal rate.   Neurological: She is alert.  Skin: Skin is warm.  Psychiatric: She has a normal mood and affect.    Review of Systems  Psychiatric/Behavioral: Positive for depression and hallucinations (Continue to  present with psychosis, paranoia & delusional). Negative for substance abuse and suicidal ideas. The patient is nervous/anxious.     Blood pressure 129/70, pulse (!) 119, temperature 98.5 F (36.9 C), temperature source Oral, resp. rate 18, height 5' 7.5" (1.715 m), weight 78.9 kg (174 lb), SpO2 100 %.Body mass index is 26.85 kg/m.  General Appearance: casual. continues to present with irritability, agitation and pressured speech  Eye Contact:  Fair  Speech:  Pressured  Volume:  Normal with fluctuation   Mood:  Anxious, Dysphoric and Irritable  Affect:  Depressed, Flat and Labile  Thought Process:  Descriptions of Associations: Loose  Orientation:  Other:  UTA patient is not cooperactive with discussion  Thought Content:  NA  Suicidal Thoughts:  UTA  Homicidal Thoughts:  N/A  Memory:  NA  Judgement:  Poor  Insight:  Lacking  Psychomotor Activity:  Restlessness  Concentration:  Concentration: Poor  Recall:  Poor  Fund of Knowledge:  Poor  Language:  Good  Akathisia:  No  Handed:  Right  AIMS (if indicated):     Assets:  Communication Skills Resilience Social Support  ADL's:  Intact  Cognition:  WNL  Sleep:  Number of Hours: 5.75    I agree with current treatment plan on 03/18/2017, Patient seen face-to-face for psychiatric evaluation follow-up, chart reviewed. Reviewed the information documented and agree with the treatment plan.  Treatment Plan Summary: Daily contact with patient to assess and evaluate symptoms and progress in treatment and Medication management  Psychiatric: Schizoaffective disorder, bipolar-type - Unstable  Continue with current treatment plan 03/18/2017 except where noted  Medical: Patient refused her blood pressure medication this morning. Will continue to monitor for any new symptoms.  Patient is currently on a forced medication order due to her current state of mind & refusal to take her medications regimen.   Continue current plan of care as  recommended. (See MAR).  Already called primary care consult for her iron deficiency anemia. See consult notes.   See order for Anemia panel & fecal ocult test (Patient refused for the samples to be collected). Continue Ferrous sulfate 325 mg to TID as recommended by the primary care hospitalist. Continue to monitor mood, behavior and interaction with peers  Derrill Center, NP, 03/18/2017, 3:02 PM

## 2017-03-19 MED ORDER — DIVALPROEX SODIUM ER 500 MG PO TB24
1000.0000 mg | ORAL_TABLET | Freq: Every day | ORAL | Status: DC
  Filled 2017-03-19 (×4): qty 2

## 2017-03-19 NOTE — Progress Notes (Addendum)
Patient isolates in her room. Patient very Irritable upon approach. Patient stated "get out of here. I am fine. I don't need you or anyone around me". Uncooperative with this Probation officer but accepted her med after much encouragement. Patient remains safe on unit. Will continue to monitor patient.

## 2017-03-19 NOTE — B[facility] Group Notes (Signed)
[place] LCSW Group Therapy  03/19/2017  11:15 AM  Type of Therapy:  Group Therapy  Participation Level:  Did not Attend  Christene Lye MSW, LCSW

## 2017-03-19 NOTE — Progress Notes (Signed)
[facility] MD Progress Note  03/19/2017 11:58 AM LAMA NARAYANAN  MRN:  [id number]    Subjective:  Shanvi reports "HI and BYE"  Objective: Milina is seen resting in bedroom. continues to present with irritably and irration. Per staff  Ricka is isolative  to her room and hasn't attended group session this weekend. Chart reviewed patient took her first dose of Depakote 750 mg on 7/21- nurse reports patient is tolerating medications well.-She continues to present with a guarded, irritated and flat affect.  Kyrra to continue on forced medications order.  Staffing notes patient continues to refuse vitals & some of her medications this morning. Support, encouragement and reassurance was provided.  Principal Problem: Schizoaffective disorder, bipolar type ([place]) Diagnosis:   Patient Active Problem List   Diagnosis Date Noted  . Schizoaffective disorder, bipolar type ([place]) [F25.0]   . Suicidal ideation [R45.851]   . Menorrhagia [N92.0] 05/12/2014  . Personality disorder [F60.9] 10/17/2012  . Anemia, iron deficiency [D50.9] 10/13/2012  . Tachycardia [R00.0] 10/01/2012   Total Time spent with patient: 15 minutes  Past Psychiatric History: Schizoaffective disorder, Bipolar-type.  Past Medical History:  Past Medical History:  Diagnosis Date  . Paranoid behavior ([place])   . Schizophrenia [facility])     Past Surgical History:  Procedure Laterality Date  . CESAREAN SECTION     Family History:  Family History  Problem Relation Age of Onset  . Hypertension Other   . Diabetes Other   . Cancer Other    Family Psychiatric  History:   Social History:  History  Alcohol Use  . Yes    Comment: socially     History  Drug Use No    Social History   Social History  . Marital status: Legally Separated    Spouse name: N/A  . Number of children: N/A  . Years of education: N/A   Social History Main Topics  . Smoking status: Never Smoker  . Smokeless tobacco: Never Used  . Alcohol use Yes      Comment: socially  . Drug use: No  . Sexual activity: No   Other Topics Concern  . None   Social History Narrative  . None   Additional Social History:   Sleep: Fair  Appetite:  Fair per staffing notes  Current Medications: Current Facility-Administered Medications  Medication Dose Route Frequency Provider Last Rate Last Dose  . acetaminophen (TYLENOL) tablet 650 mg  650 mg Oral Q4H PRN Patrecia Pour, NP      . alum & mag hydroxide-simeth (MAALOX/MYLANTA) 200-200-20 MG/5ML suspension 30 mL  30 mL Oral Q6H PRN Patrecia Pour, NP      . benztropine (COGENTIN) tablet 0.5 mg  0.5 mg Oral BID Cobos, Myer Peer, MD   0.5 mg at 03/19/17 2585   Or  . benztropine mesylate (COGENTIN) injection 0.5 mg  0.5 mg Intramuscular BID Cobos, Myer Peer, MD      . divalproex (DEPAKOTE ER) 24 hr tablet 750 mg  750 mg Oral QHS Hisada, Elie Goody, MD   750 mg at 03/18/17 2203  . docusate sodium (COLACE) capsule 100 mg  100 mg Oral BID Lindell Spar I, NP   100 mg at 03/10/17 0900  . ferrous sulfate tablet 325 mg  325 mg Oral TID WC Nwoko, Agnes I, NP      . gabapentin (NEURONTIN) capsule 300 mg  300 mg Oral BID Patrecia Pour, NP   300 mg at 03/10/17 0900  . haloperidol lactate (  HALDOL) injection 10 mg  10 mg Intramuscular BID Hampton Abbot, MD       Or  . haloperidol (HALDOL) tablet 10 mg  10 mg Oral BID Hampton Abbot, MD   10 mg at 03/19/17 8850  . haloperidol (HALDOL) tablet 5 mg  5 mg Oral Q6H PRN Lindell Spar I, NP   5 mg at 03/09/17 2033   Or  . haloperidol lactate (HALDOL) injection 5 mg  5 mg Intramuscular Q6H PRN Lindell Spar I, NP      . lisinopril (PRINIVIL,ZESTRIL) tablet 10 mg  10 mg Oral Daily Lindell Spar I, NP   10 mg at 03/18/17 0858  . lisinopril (PRINIVIL,ZESTRIL) tablet 20 mg  20 mg Oral Once Lindell Spar I, NP      . LORazepam (ATIVAN) tablet 1 mg  1 mg Oral Q6H PRN Lindell Spar I, NP   1 mg at 03/09/17 2033   Or  . LORazepam (ATIVAN) injection 1 mg  1 mg Intramuscular Q6H PRN  Nwoko, Agnes I, NP      . LORazepam (ATIVAN) tablet 2 mg  2 mg Oral BID Cobos, Myer Peer, MD   2 mg at 03/19/17 2774   Or  . LORazepam (ATIVAN) injection 2 mg  2 mg Intramuscular BID Cobos, Fernando A, MD      . magnesium hydroxide (MILK OF MAGNESIA) suspension 30 mL  30 mL Oral Daily PRN Patrecia Pour, NP      . nicotine (NICODERM CQ - dosed in mg/24 hours) patch 21 mg  21 mg Transdermal Daily Lord, Jamison Y, NP      . ondansetron [facility]) tablet 4 mg  4 mg Oral Q8H PRN Patrecia Pour, NP        Lab Results: No results found for this or any previous visit (from the past 48 hour(s)).  Blood Alcohol level:  Lab Results  Component Value Date   ETH <5 03/07/2017   ETH <5 12/87/8676   Metabolic Disorder Labs: No results found for: HGBA1C, MPG No results found for: PROLACTIN No results found for: CHOL, TRIG, HDL, CHOLHDL, VLDL, LDLCALC  Physical Findings: AIMS: Facial and Oral Movements Muscles of Facial Expression: None, normal Lips and Perioral Area: None, normal Jaw: None, normal Tongue: None, normal,Extremity Movements Upper (arms, wrists, hands, fingers): None, normal Lower (legs, knees, ankles, toes): None, normal, Trunk Movements Neck, shoulders, hips: None, normal, Overall Severity Severity of abnormal movements (highest score from questions above): None, normal Incapacitation due to abnormal movements: None, normal Patient's awareness of abnormal movements (rate only patient's report): No Awareness, Dental Status Current problems with teeth and/or dentures?: No Does patient usually wear dentures?: No  CIWA:    COWS:     Musculoskeletal: Strength & Muscle Tone: within normal limits Gait & Station: normal Patient leans: N/A  Psychiatric Specialty Exam: Physical Exam  Vitals reviewed. Constitutional: She appears well-developed.  Cardiovascular: Normal rate.   Neurological: She is alert.  Skin: Skin is warm.  Psychiatric: She has a normal mood and affect.     Review of Systems  Psychiatric/Behavioral: Positive for depression and hallucinations (Continue to present with psychosis, paranoia & delusional). Negative for substance abuse and suicidal ideas. The patient is nervous/anxious.     Blood pressure 129/70, pulse (!) 119, temperature 98.5 F (36.9 C), temperature source Oral, resp. rate 18, height 5' 7.5" (1.715 m), weight 78.9 kg (174 lb), SpO2 100 %.Body mass index is 26.85 kg/m.  General Appearance: casual. continues to present  with irritability, agitation  Eye Contact:  Fair  Speech:  Pressured  Volume:  Normal with fluctuation   Mood:  Anxious, Dysphoric and Irritable  Affect:  Depressed, Flat and Labile  Thought Process:  Descriptions of Associations: Loose  Orientation:  Other:  UTA patient is not cooperactive with discussion  Thought Content:  NA  Suicidal Thoughts:  UTA  Homicidal Thoughts:  N/A  Memory:  NA  Judgement:  Poor  Insight:  Lacking  Psychomotor Activity:  Restlessness  Concentration:  Concentration: Poor  Recall:  Poor  Fund of Knowledge:  Poor  Language:  Good  Akathisia:  No  Handed:  Right  AIMS (if indicated):     Assets:  Communication Skills Resilience Social Support  ADL's:  Intact  Cognition:  WNL  Sleep:  Number of Hours: 5.75    I agree with current treatment plan on 03/19/2017, Patient seen face-to-face for psychiatric evaluation follow-up, chart reviewed. Reviewed the information documented and agree with the treatment plan.  Treatment Plan Summary: Daily contact with patient to assess and evaluate symptoms and progress in treatment and Medication management  Psychiatric: Schizoaffective disorder, bipolar-type - Unstable  Continue with current treatment plan 03/19/2017 except where noted  Medical: Patient refused her blood pressure medication this morning. Will continue to monitor for any new symptoms.  Patient is currently on a forced medication order due to her current state of mind  & refusal to take her medications regimen.   Continue current plan of care as recommended. (See MAR). Continue Depakote  750 mg PO QHS for mood stabilization. Valproic level due 03/23/2017 Already called primary care consult for her iron deficiency anemia. See consult notes.   See order for Anemia panel & fecal ocult test (Patient refused for the samples to be collected). Continue Ferrous sulfate 325 mg to TID as recommended by the primary care hospitalist. Continue to monitor mood, behavior and interaction with peers  Derrill Center, NP, 03/19/2017, 11:58 AM

## 2017-03-19 NOTE — Progress Notes (Signed)
Refused 5pm medications "I just read my patient rights and I don't have to do anything at all. I don't need them, I'm not taking them". Continues hostile, verbally abusive, non-participative. Unable to assess for psychosis but behavior suggests paranoia.

## 2017-03-20 MED ORDER — HALOPERIDOL DECANOATE 100 MG/ML IM SOLN
100.0000 mg | Freq: Once | INTRAMUSCULAR | Status: AC
  Administered 2017-03-20: 100 mg via INTRAMUSCULAR
  Filled 2017-03-20: qty 1

## 2017-03-20 NOTE — Progress Notes (Signed)
Nursing Progress Note [id number]  D) Patient presents angry and irritable. Patient remains in bed in no acute distress. Patient refuses PM order of Depakote and states "I don't take night medicines, you will not make me". Patient denies SI/HI/AVH or pain. Patient passively contracts for safety on the unit. Patient refused to attend group or have snacks. PO fluids provided and encouraged. Patient irritable with staff and asks them to leave her room when they attempt to engage.  A) Emotional support given. 1:1 interaction and active listening provided. No medications administered this shift. Patient safety maintained with q15 min safety checks. Low fall risk precautions in place.  R) Patient remains safe on the unit at this time. Patient is resting in bed without complaints. Will continue to monitor.

## 2017-03-20 NOTE — Progress Notes (Signed)
Patient isolating to bed. Writer attempted to engage with patient. Patient states irritably "I don't need anything. You may exit my room now". Will attempt to engage later.

## 2017-03-20 NOTE — Plan of Care (Signed)
Problem: Safety: Goal: Periods of time without injury will increase Outcome: Progressing Patient is on q15 minute safety checks and low fall risk precautions. Patient contracts for safety on the unit and remains safe at this time.   

## 2017-03-20 NOTE — Progress Notes (Signed)
Did not attend group 

## 2017-03-20 NOTE — BHH Group Notes (Signed)
[facility] LCSW Group Therapy  03/20/2017 1:30 - 2:15 PM  Todays Topic: Overcoming Obstacles. Patients identified one short term goal and potential obstacles in reaching this goal. Patients processed barriers involved in overcoming these obstacles. Patients identified steps necessary for overcoming these obstacles and explored motivation (internal and external) for facing these difficulties head on.  Type of Therapy:  Group Therapy   Beverely Pace  LCSW 03/20/2017, 3:16 PM

## 2017-03-20 NOTE — Progress Notes (Signed)
[facility] MD Progress Note  03/20/2017 11:33 AM Valerie Howard  MRN:  [id number]    Subjective:  Valerie Howard is requesting to be  Discharged today.   Objective: Seen resting in dayroom. Valerie Howard presents with less irritably today. Valerie Howard was more engaged and talkative during this assessment. States " so what's up with the shot that I have to take?"  Education was provided with monthly injections (Decanoate) for mood stabilization,. Patient is receptive to medication management.  Patient reports " is this medication going to cause me to again more weight? (pointing and pulling at her stomach) states " that's not sexy".  Chart reviewed noted that patient is tolerating Depakote 750 mg.   Valerie Howard to continue on forced medications order. Staffing notes patient continues to refuse vitals & some of her medications.  Support, encouragement and reassurance was provided.  Principal Problem: Schizoaffective disorder, bipolar type ([place]) Diagnosis:   Patient Active Problem List   Diagnosis Date Noted  . Schizoaffective disorder, bipolar type ([place]) [F25.0]   . Suicidal ideation [R45.851]   . Menorrhagia [N92.0] 05/12/2014  . Personality disorder [F60.9] 10/17/2012  . Anemia, iron deficiency [D50.9] 10/13/2012  . Tachycardia [R00.0] 10/01/2012   Total Time spent with patient: 15 minutes  Past Psychiatric History: Schizoaffective disorder, Bipolar-type.  Past Medical History:  Past Medical History:  Diagnosis Date  . Paranoid behavior ([place])   . Schizophrenia [facility])     Past Surgical History:  Procedure Laterality Date  . CESAREAN SECTION     Family History:  Family History  Problem Relation Age of Onset  . Hypertension Other   . Diabetes Other   . Cancer Other    Family Psychiatric  History:   Social History:  History  Alcohol Use  . Yes    Comment: socially     History  Drug Use No    Social History   Social History  . Marital status: Legally Separated    Spouse name: N/A  . Number of  children: N/A  . Years of education: N/A   Social History Main Topics  . Smoking status: Never Smoker  . Smokeless tobacco: Never Used  . Alcohol use Yes     Comment: socially  . Drug use: No  . Sexual activity: No   Other Topics Concern  . None   Social History Narrative  . None   Additional Social History:   Sleep: Fair  Appetite:  Fair per staffing notes  Current Medications: Current Facility-Administered Medications  Medication Dose Route Frequency Provider Last Rate Last Dose  . acetaminophen (TYLENOL) tablet 650 mg  650 mg Oral Q4H PRN Patrecia Pour, NP      . alum & mag hydroxide-simeth (MAALOX/MYLANTA) 200-200-20 MG/5ML suspension 30 mL  30 mL Oral Q6H PRN Patrecia Pour, NP      . benztropine (COGENTIN) tablet 0.5 mg  0.5 mg Oral BID Cobos, Myer Peer, MD   0.5 mg at 03/20/17 9326   Or  . benztropine mesylate (COGENTIN) injection 0.5 mg  0.5 mg Intramuscular BID Cobos, Myer Peer, MD      . divalproex (DEPAKOTE ER) 24 hr tablet 1,000 mg  1,000 mg Oral QHS Hisada, Reina, MD      . docusate sodium (COLACE) capsule 100 mg  100 mg Oral BID Lindell Spar I, NP   100 mg at 03/10/17 0900  . ferrous sulfate tablet 325 mg  325 mg Oral TID WC Lindell Spar I, NP      .  gabapentin (NEURONTIN) capsule 300 mg  300 mg Oral BID Patrecia Pour, NP   300 mg at 03/10/17 0900  . haloperidol lactate (HALDOL) injection 10 mg  10 mg Intramuscular BID Hampton Abbot, MD       Or  . haloperidol (HALDOL) tablet 10 mg  10 mg Oral BID Hampton Abbot, MD   10 mg at 03/20/17 9563  . haloperidol (HALDOL) tablet 5 mg  5 mg Oral Q6H PRN Lindell Spar I, NP   5 mg at 03/09/17 2033   Or  . haloperidol lactate (HALDOL) injection 5 mg  5 mg Intramuscular Q6H PRN Nwoko, Herbert Pun I, NP      . haloperidol decanoate (HALDOL DECANOATE) 100 MG/ML injection 100 mg  100 mg Intramuscular Once Derrill Center, NP      . lisinopril (PRINIVIL,ZESTRIL) tablet 10 mg  10 mg Oral Daily Lindell Spar I, NP   10 mg at  03/18/17 0858  . lisinopril (PRINIVIL,ZESTRIL) tablet 20 mg  20 mg Oral Once Lindell Spar I, NP      . LORazepam (ATIVAN) tablet 1 mg  1 mg Oral Q6H PRN Lindell Spar I, NP   1 mg at 03/09/17 2033   Or  . LORazepam (ATIVAN) injection 1 mg  1 mg Intramuscular Q6H PRN Nwoko, Agnes I, NP      . LORazepam (ATIVAN) tablet 2 mg  2 mg Oral BID Cobos, Myer Peer, MD   2 mg at 03/20/17 8756   Or  . LORazepam (ATIVAN) injection 2 mg  2 mg Intramuscular BID Cobos, Fernando A, MD      . magnesium hydroxide (MILK OF MAGNESIA) suspension 30 mL  30 mL Oral Daily PRN Patrecia Pour, NP      . nicotine (NICODERM CQ - dosed in mg/24 hours) patch 21 mg  21 mg Transdermal Daily Lord, Jamison Y, NP      . ondansetron [facility]) tablet 4 mg  4 mg Oral Q8H PRN Patrecia Pour, NP        Lab Results: No results found for this or any previous visit (from the past 48 hour(s)).  Blood Alcohol level:  Lab Results  Component Value Date   ETH <5 03/07/2017   ETH <5 43/32/9518   Metabolic Disorder Labs: No results found for: HGBA1C, MPG No results found for: PROLACTIN No results found for: CHOL, TRIG, HDL, CHOLHDL, VLDL, LDLCALC  Physical Findings: AIMS: Facial and Oral Movements Muscles of Facial Expression: None, normal Lips and Perioral Area: None, normal Jaw: None, normal Tongue: None, normal,Extremity Movements Upper (arms, wrists, hands, fingers): None, normal Lower (legs, knees, ankles, toes): None, normal, Trunk Movements Neck, shoulders, hips: None, normal, Overall Severity Severity of abnormal movements (highest score from questions above): None, normal Incapacitation due to abnormal movements: None, normal Patient's awareness of abnormal movements (rate only patient's report): No Awareness, Dental Status Current problems with teeth and/or dentures?: No Does patient usually wear dentures?: No  CIWA:    COWS:     Musculoskeletal: Strength & Muscle Tone: within normal limits Gait & Station:  normal Patient leans: N/A  Psychiatric Specialty Exam: Physical Exam  Vitals reviewed. Constitutional: She appears well-developed.  Cardiovascular: Normal rate.   Neurological: She is alert.  Skin: Skin is warm.  Psychiatric: She has a normal mood and affect.    Review of Systems  Psychiatric/Behavioral: Positive for depression and hallucinations (Continue to present with psychosis, paranoia & delusional). Negative for substance abuse and suicidal ideas. The  patient is nervous/anxious.     Blood pressure 134/87, pulse (!) 116, temperature 98.7 F (37.1 C), temperature source Oral, resp. rate 18, height 5' 7.5" (1.715 m), weight 78.9 kg (174 lb), SpO2 100 %.Body mass index is 26.85 kg/m.  General Appearance: casual. continues to present with irritability, agitation, patient has a brighter affect today  Eye Contact:  Fair  Speech:  Pressured  Volume:  Normal with fluctuation   Mood:  Anxious, Dysphoric and Irritable- improving   Affect:  Depressed, Flat and Labile  Thought Process:  Descriptions of Associations: Loose  Orientation:  Other:  UTA patient is not cooperactive with discussion  Thought Content:  NA  Suicidal Thoughts:  UTA  Homicidal Thoughts:  N/A  Memory:  NA  Judgement:  Poor  Insight:  Lacking  Psychomotor Activity:  Restlessness- improving   Concentration:  Concentration: Poor  Recall:  Poor  Fund of Knowledge:  Fair  Language:  Good  Akathisia:  No  Handed:  Right  AIMS (if indicated):     Assets:  Communication Skills Resilience Social Support  ADL's:  Intact  Cognition:  WNL  Sleep:  Number of Hours: 6.75    I agree with current treatment plan on 03/20/2017, Patient seen face-to-face for psychiatric evaluation follow-up, chart reviewed. case was discussed with MD Dwyane Dee and treatment team. Reviewed the information documented and agree with the treatment plan.  Treatment Plan Summary: Daily contact with patient to assess and evaluate symptoms and  progress in treatment and Medication management  Psychiatric: Schizoaffective disorder, bipolar-type - Unstable  Continue with current treatment plan 03/20/2017 except where noted  Medical: Patient refused her blood pressure medication this morning. Will continue to monitor for any new symptoms.  Patient is currently on a forced medication order due to her current state of mind & refusal to take her medications regimen.   Continue current plan of care as recommended. (See MAR). Continue Depakote  750 mg PO QHS for mood stabilization. Started Haldol Decanoate 100mg  injection  Consider discharge 03/21/2017 Valproic level due 03/23/2017 Already called primary care consult for her iron deficiency anemia. See consult notes.   See order for Anemia panel & fecal ocult test (Patient refused for the samples to be collected). Continue Ferrous sulfate 325 mg to TID as recommended by the primary care hospitalist. Continue to monitor mood, behavior and interaction with peers  Derrill Center, NP, 03/20/2017, 11:33 AM

## 2017-03-20 NOTE — BHH Suicide Risk Assessment (Signed)
[place] INPATIENT:  Family/Significant Other Suicide Prevention Education  Suicide Prevention Education:  Patient Refusal for Family/Significant Other Suicide Prevention Education: The patient Valerie Howard has refused to provide written consent for family/significant other to be provided Family/Significant Other Suicide Prevention Education during admission and/or prior to discharge.  Physician notified.  Beverely Pace 03/20/2017, 2:58 PM

## 2017-03-20 NOTE — B[facility] Group Notes (Signed)
[facility] LCSW Aftercare Discharge Planning Group Note   03/20/2017 10:02 AM  Participation Quality:  Invited, did not attend   Beverely Pace LCSW

## 2017-03-20 NOTE — Progress Notes (Signed)
Dar note: Patient presents with irritable affect and angry mood.  Patient continues to be noncompliant with treatment plan.  Patient continues with forced medication order.  Haldol decanoate injection given.  No adverse effect noted or reported.  Routine safety checks maintained.  Patient visible in the dayroom.  No interaction with staff or peers.  Attended group and participated.

## 2017-03-20 NOTE — Progress Notes (Signed)
Recreation Therapy Notes  Date: 03/20/2017 Time: 10:00am Location: [address] Dayroom  Group Topic: Coping Skills  Goal Area(s) Addresses:  Pt will successfully identify at least five things that are preventing them from moving forward. Pt will successfully identify at least two coping skills they can use to move forward from the places they are currently "stuck."  Intervention: Art  Activity: Pt was given a piece of printer paper and asked to draw a spider web. Pt was asked to identify at least 5 things holding them back and 10 coping skills to help them get past the things that are holding them back. Pt then shared their spider webs with peers.  Education:Coping Skills, Discharge Planning  Education Outcome: Acknowledges understanding   Clinical Observations/Feedback:  Pt did not attend group.  Donovan Kail, Recreation Therapy Intern  Victorino Sparrow, LRT/CTRS

## 2017-03-20 NOTE — Progress Notes (Signed)
Patient continue to be isolative. Irritable upon approach. Refuses her bedtime med. Asked this writer to get out of her room that nothing is wrong with her. "I don't need any medication. I am good and fine. It's you that needs those medicine. Please get out of my room". Refuses education on med compliance. Will continue to monitor patient.

## 2017-03-20 NOTE — Tx Team (Signed)
Interdisciplinary Treatment and Diagnostic Plan Update  03/20/2017 Time of Session: 8:30 AM Valerie Howard MRN: [id number]  Principal Diagnosis: Schizoaffective disorder, bipolar type ([place])  Secondary Diagnoses: Principal Problem:   Schizoaffective disorder, bipolar type ([place]) Active Problems:   Anemia, iron deficiency   Menorrhagia   Current Medications:  Current Facility-Administered Medications  Medication Dose Route Frequency Provider Last Rate Last Dose  . acetaminophen (TYLENOL) tablet 650 mg  650 mg Oral Q4H PRN Patrecia Pour, NP      . alum & mag hydroxide-simeth (MAALOX/MYLANTA) 200-200-20 MG/5ML suspension 30 mL  30 mL Oral Q6H PRN Patrecia Pour, NP      . benztropine (COGENTIN) tablet 0.5 mg  0.5 mg Oral BID Cobos, Myer Peer, MD   0.5 mg at 03/20/17 0277   Or  . benztropine mesylate (COGENTIN) injection 0.5 mg  0.5 mg Intramuscular BID Cobos, Myer Peer, MD      . divalproex (DEPAKOTE ER) 24 hr tablet 1,000 mg  1,000 mg Oral QHS Hisada, Reina, MD      . docusate sodium (COLACE) capsule 100 mg  100 mg Oral BID Lindell Spar I, NP   100 mg at 03/10/17 0900  . ferrous sulfate tablet 325 mg  325 mg Oral TID WC Nwoko, Agnes I, NP      . gabapentin (NEURONTIN) capsule 300 mg  300 mg Oral BID Patrecia Pour, NP   300 mg at 03/10/17 0900  . haloperidol lactate (HALDOL) injection 10 mg  10 mg Intramuscular BID Hampton Abbot, MD       Or  . haloperidol (HALDOL) tablet 10 mg  10 mg Oral BID Hampton Abbot, MD   10 mg at 03/20/17 0953  . haloperidol (HALDOL) tablet 5 mg  5 mg Oral Q6H PRN Lindell Spar I, NP   5 mg at 03/09/17 2033   Or  . haloperidol lactate (HALDOL) injection 5 mg  5 mg Intramuscular Q6H PRN Lindell Spar I, NP      . lisinopril (PRINIVIL,ZESTRIL) tablet 10 mg  10 mg Oral Daily Lindell Spar I, NP   10 mg at 03/18/17 0858  . lisinopril (PRINIVIL,ZESTRIL) tablet 20 mg  20 mg Oral Once Lindell Spar I, NP      . LORazepam (ATIVAN) tablet 1 mg  1 mg Oral Q6H PRN  Lindell Spar I, NP   1 mg at 03/09/17 2033   Or  . LORazepam (ATIVAN) injection 1 mg  1 mg Intramuscular Q6H PRN Nwoko, Agnes I, NP      . LORazepam (ATIVAN) tablet 2 mg  2 mg Oral BID Cobos, Myer Peer, MD   2 mg at 03/20/17 4128   Or  . LORazepam (ATIVAN) injection 2 mg  2 mg Intramuscular BID Cobos, Fernando A, MD      . magnesium hydroxide (MILK OF MAGNESIA) suspension 30 mL  30 mL Oral Daily PRN Patrecia Pour, NP      . nicotine (NICODERM CQ - dosed in mg/24 hours) patch 21 mg  21 mg Transdermal Daily Lord, Jamison Y, NP      . ondansetron [facility]) tablet 4 mg  4 mg Oral Q8H PRN Patrecia Pour, NP        PTA Medications: Prescriptions Prior to Admission  Medication Sig Dispense Refill Last Dose  . OLANZapine (ZYPREXA) 10 MG tablet Take 10 mg by mouth 2 (two) times daily.   March 2017 at unknown time    Patient Stressors: Medication change or  noncompliance  Patient Strengths: Average or above average intelligence Capable of independent living Physical Health  Treatment Modalities: Medication Management, Group therapy, Case management,  1 to 1 session with clinician, Psychoeducation, Recreational therapy.   Physician Treatment Plan for Primary Diagnosis: Schizoaffective disorder, bipolar type ([place]) Long Term Goal(s): Improvement in symptoms so as ready for discharge  Short Term Goals: Ability to identify changes in lifestyle to reduce recurrence of condition will improve Ability to verbalize feelings will improve Ability to demonstrate self-control will improve Ability to identify and develop effective coping behaviors will improve Compliance with prescribed medications will improve  Medication Management: Evaluate patient's response, side effects, and tolerance of medication regimen.  Therapeutic Interventions: 1 to 1 sessions, Unit Group sessions and Medication administration.  Evaluation of Outcomes: Progressing   7/18: Patient is currently on a forced medication  order due to her current state of mind &refusal to take her medications regimen. She is willing to take Cogentin and Haldol orally BID only because she knows she will get an injection if she doesn't.  Furthermore, she is refusing all labs, blood pressure checks, etc.  Physician Treatment Plan for Secondary Diagnosis: Principal Problem:   Schizoaffective disorder, bipolar type ([place]) Active Problems:   Anemia, iron deficiency   Menorrhagia   Long Term Goal(s): Improvement in symptoms so as ready for discharge  Short Term Goals: Ability to identify changes in lifestyle to reduce recurrence of condition will improve Ability to verbalize feelings will improve Ability to demonstrate self-control will improve Ability to identify and develop effective coping behaviors will improve Compliance with prescribed medications will improve  Medication Management: Evaluate patient's response, side effects, and tolerance of medication regimen.  Therapeutic Interventions: 1 to 1 sessions, Unit Group sessions and Medication administration.  Evaluation of Outcomes: Progressing   RN Treatment Plan for Primary Diagnosis: Schizoaffective disorder, bipolar type ([place]) Long Term Goal(s): Knowledge of disease and therapeutic regimen to maintain health will improve  Short Term Goals: Ability to identify and develop effective coping behaviors will improve and Compliance with prescribed medications will improve  Medication Management: RN will administer medications as ordered by provider, will assess and evaluate patient's response and provide education to patient for prescribed medication. RN will report any adverse and/or side effects to prescribing provider.  Therapeutic Interventions: 1 on 1 counseling sessions, Psychoeducation, Medication administration, Evaluate responses to treatment, Monitor vital signs and CBGs as ordered, Perform/monitor CIWA, COWS, AIMS and Fall Risk screenings as ordered, Perform wound  care treatments as ordered.  Evaluation of Outcomes: Progressing   LCSW Treatment Plan for Primary Diagnosis: Schizoaffective disorder, bipolar type ([place]) Long Term Goal(s): Safe transition to appropriate next level of care at discharge, Engage patient in therapeutic group addressing interpersonal concerns.  Short Term Goals: Engage patient in aftercare planning with referrals and resources  Therapeutic Interventions: Assess for all discharge needs, 1 to 1 time with Social worker, Explore available resources and support systems, Assess for adequacy in community support network, Educate family and significant other(s) on suicide prevention, Complete Psychosocial Assessment, Interpersonal group therapy.  Evaluation of Outcomes: Not Met  Pt is refusing services post d/c; MD requesting referral to injection clinic at this time.   Progress in Treatment: Attending groups: Yes Participating in groups: Yes Taking medication as prescribed: Yes Toleration medication: Yes, no side effects reported at this time Family/Significant other contact made: No, patient declining consent Patient understands diagnosis: Yes AEB Discussing patient identified problems/goals with staff: Yes Medical problems stabilized or resolved: Yes Denies  suicidal/homicidal ideation: Yes Issues/concerns per patient self-inventory: None Other: N/A  New problem(s) identified: 7/23:  MD plans to adjust medications, CSW will continue to talk w patient re aftercare and attempt to gain consent  New Short Term/Long Term Goal(s): Team assessing whether patient is at baseline at this time and what other supports might be needed outpatient  Discharge Plan or Barriers:  Patient unwilling to consent to aftercare at this time, CSW to assess housing stability  Reason for Continuation of Hospitalization: Disorganization Mood instability Paranoia Medication stabilization   Estimated Length of Stay: 5 days; discharge  03/21/17  Attendees: Patient: 03/20/2017  12:09 PM  Physician: Hampton Abbot, MD 03/20/2017  12:09 PM  Nursing: Rise Paganini, RN 03/20/2017  12:09 PM  RN Care Manager: Lars Pinks, RN 03/20/2017  12:09 PM  Social Worker: Eusebio Me LCSW 03/20/2017  12:09 PM  Recreational Therapist: Winfield Cunas 03/20/2017  12:09 PM  Other: Norberto Sorenson 03/20/2017  12:09 PM  Other:  03/20/2017  12:09 PM    Scribe for Treatment Team:  Edwyna Shell LCSW 03/20/2017 12:09 PM

## 2017-03-21 DIAGNOSIS — G4700 Insomnia, unspecified: Secondary | ICD-10-CM

## 2017-03-21 MED ORDER — DIVALPROEX SODIUM ER 500 MG PO TB24: 1000.0000 mg | ORAL_TABLET | Freq: Every day | ORAL | 0 refills | Status: DC

## 2017-03-21 MED ORDER — NICOTINE 21 MG/24HR TD PT24: 21.0000 mg | MEDICATED_PATCH | Freq: Every day | TRANSDERMAL | 0 refills | Status: DC

## 2017-03-21 MED ORDER — HALOPERIDOL DECANOATE 100 MG/ML IM SOLN: 100.0000 mg | Freq: Once | INTRAMUSCULAR | Status: DC

## 2017-03-21 MED ORDER — BENZTROPINE MESYLATE 0.5 MG PO TABS: 0.5000 mg | ORAL_TABLET | Freq: Two times a day (BID) | ORAL | 0 refills | Status: DC

## 2017-03-21 MED ORDER — GABAPENTIN 300 MG PO CAPS: 300.0000 mg | ORAL_CAPSULE | Freq: Two times a day (BID) | ORAL | 0 refills | Status: DC

## 2017-03-21 MED ORDER — DOCUSATE SODIUM 100 MG PO CAPS: 100.0000 mg | ORAL_CAPSULE | Freq: Two times a day (BID) | ORAL | 0 refills | Status: DC

## 2017-03-21 MED ORDER — HALOPERIDOL DECANOATE 100 MG/ML IM SOLN: INTRAMUSCULAR | 0 refills | Status: DC

## 2017-03-21 MED ORDER — HALOPERIDOL 10 MG PO TABS: 10.0000 mg | ORAL_TABLET | Freq: Two times a day (BID) | ORAL | 0 refills | Status: DC

## 2017-03-21 MED ORDER — LISINOPRIL 10 MG PO TABS: 10.0000 mg | ORAL_TABLET | Freq: Every day | ORAL | 0 refills | Status: DC

## 2017-03-21 MED ORDER — FERROUS SULFATE 325 (65 FE) MG PO TABS: 325.0000 mg | ORAL_TABLET | Freq: Three times a day (TID) | ORAL | 0 refills | Status: DC

## 2017-03-21 NOTE — Progress Notes (Signed)
Nursing Note 03/21/2017   Data Reports sleeping fair.  Did not complete self-inventory.  Patient standoffish with RN, irritable.  Received meds from a different RN who had a better rapport.  Denied HI, SI, AVH.  Remains in room.  MD aware of patient's bx.  Received discharge orders.  Action Spoke with patient 1:1, nurse offered support to patient throughout shift.  Discharge completed by another RN with better rapport.  Response Remained safe throughout day, at baseline per MD.

## 2017-03-21 NOTE — [facility] Suicide Risk Assessment (Addendum)
[facility] Discharge Suicide Risk Assessment   Principal Problem: Schizoaffective disorder, bipolar type [facility]) Discharge Diagnoses:  Patient Active Problem List   Diagnosis Date Noted  . Schizoaffective disorder, bipolar type ([place]) [F25.0]     Priority: High  . Anemia, iron deficiency [D50.9] 10/13/2012    Priority: High  . Menorrhagia [N92.0] 05/12/2014    Priority: Medium  . Suicidal ideation [R45.851]   . Personality disorder [F60.9] 10/17/2012  . Tachycardia [R00.0] 10/01/2012  Patient is a 41 year old female transferred under IVC from [facility] long hospital for stabilization and treatment of paranoid behavior, delusions and auditory/visual hallucinations. Patient was very agitated and angry on presentation. She stated that there was nothing wrong with her. She did report that she was not taking her medications as she did not feel she needed to be on any medications.  Patient during her hospital stay, had a second opinion for force medications, was started on Haldol oral medication, and yesterday received Haldol Decanoate. Patient is no longer irritable, willing to have a conversation though she does report some paranoia. She seems to have no insight into her illness, needs to stay on Haldol decanoate due to her history of noncompliance, getting irritable, delusional and aggressive. Patient states that she is tired this morning and she received the Haldol decanoate. She adds that she feels calmer, states that she is ready to go home, does not seem to be responding to internal stimuli, is much more pleasant, cooperative. She also denies any symptoms of depression, mania, any suicidal or homicidal ideation. She did refuse to take Cogentin.  Patient does have a history of chronic anemia, was not willing to see the hospitalist, let lab work be reordered. Patient does need to be seen by PCP for management of her chronic anemia  Total Time spent with patient: 30 minutes  Musculoskeletal: Strength &  Muscle Tone: within normal limits Gait & Station: normal Patient leans: N/A  Psychiatric Specialty Exam: Review of Systems  Constitutional: Positive for malaise/fatigue. Negative for fever.  HENT: Negative.  Negative for congestion and sore throat.   Eyes: Negative.  Negative for blurred vision, double vision, discharge and redness.  Respiratory: Negative.  Negative for cough, shortness of breath and wheezing.   Cardiovascular: Negative.  Negative for chest pain and palpitations.  Gastrointestinal: Negative.  Negative for abdominal pain, diarrhea, heartburn, nausea and vomiting.  Musculoskeletal: Negative.  Negative for falls and myalgias.  Skin: Negative.  Negative for rash.  Neurological: Negative.  Negative for dizziness, seizures, loss of consciousness, weakness and headaches.  Endo/Heme/Allergies: Negative.  Negative for environmental allergies.  Psychiatric/Behavioral: Negative for depression, hallucinations, substance abuse and suicidal ideas. The patient is not nervous/anxious and does not have insomnia.        Continues to have some paranoia but is no longer responding to internal stimuli    Blood pressure 134/87, pulse (!) 116, temperature 98.7 F (37.1 C), temperature source Oral, resp. rate 18, height 5' 7.5" (1.715 m), weight 78.9 kg (174 lb), SpO2 100 %.Body mass index is 26.85 kg/m.  General Appearance: Casual  Eye Contact::  Fair  Speech:  Clear and Coherent and Normal Rate409  Volume:  Normal  Mood:  Euthymic  Affect:  Appropriate and Congruent  Thought Process:  Coherent, Linear and Descriptions of Associations: Intact  Orientation:  Full (Time, Place, and Person)  Thought Content:  Logical and Paranoid Ideation  Suicidal Thoughts:  No  Homicidal Thoughts:  No  Memory:  Immediate;   Fair Recent;  Fair Remote;   Fair  Judgement:  Poor  Insight:  Lacking  Psychomotor Activity:  Mannerisms  Concentration:  Fair  Recall:  [employer] of Knowledge:Fair   Language: Fair  Akathisia:  No  Handed:  Right  AIMS (if indicated):     Assets:  Financial Resources/Insurance  Sleep:  Number of Hours: 4  Cognition: WNL  ADL's:  Intact   Mental Status Per Nursing Assessment::   On Admission:     Demographic Factors:  Low socioeconomic status  Loss Factors: NA  Historical Factors: NA  Risk Reduction Factors:   NA  Continued Clinical Symptoms:  Previous Psychiatric Diagnoses and Treatments  Cognitive Features That Contribute To Risk:  Closed-mindedness    Suicide Risk:  Minimal: No identifiable suicidal ideation.  Patients presenting with no risk factors but with morbid ruminations; may be classified as minimal risk based on the severity of the depressive symptoms  Follow-up Information    [facility] Follow up on 03/22/2017.   Specialty:  Behavioral Health Why:  Hospital discharge follow up appointment for assessment and referral for medications management on 7/25 at 8 AM.  Please call to cancel/reschedule if needed.  Contact information: [place] [address] [phone]           Plan Of Care/Follow-up recommendations:  Activity:  As tolerated Diet:  Regular Other:  Keep follow-up appointments and stay on  Haldol decanoate  Hampton Abbot, MD 03/21/2017, 10:51 AM

## 2017-03-21 NOTE — Progress Notes (Signed)
Recreation Therapy Notes  INPATIENT RECREATION TR PLAN  Patient Details Name: AVANTI JETTER MRN: [id number] DOB: 1976-02-17 Today's Date: 03/21/2017  Rec Therapy Plan Is patient appropriate for Therapeutic Recreation?: Yes Treatment times per week: at leats 3x Estimated Length of Stay: 5-7 days TR Treatment/Interventions: Group participation (Comment) (appropriate participation in recreation therapy group sessions)  Discharge Criteria Pt will be discharged from therapy if:: Discharged Treatment plan/goals/alternatives discussed and agreed upon by:: Patient/family  Discharge Summary Short term goals set: Patient will attend and participate in Recreation Therapy Group Sessions  Short term goals met: Not met Reason goals not met: Pt refused to attend and/ or participate in recreation therapy group sessions Therapeutic equipment acquired: n/a Reason patient discharged from therapy: Discharge from hospital Pt/family agrees with progress & goals achieved: Yes Date patient discharged from therapy: 03/21/17  Donovan Kail, Recreation Therapy Intern  Donovan Kail 03/21/2017, 1:03 PM   Victorino Sparrow, LRT/CTRS

## 2017-03-21 NOTE — Discharge Summary (Signed)
Physician Discharge Summary Note  Patient:  Valerie Howard is an 41 y.o., female MRN:  [id number] DOB:  [birth year] Patient phone:  [phone] (home)  Patient address:   [place] [address],   Total Time spent with patient: Greater than 30 minutes  Date of Admission:  03/08/2017 Date of Discharge: 03-21-17  Reason for Admission: Erratic behaviors, paranoid ideations & psychosis.  Principal Problem: Schizoaffective disorder, bipolar type [facility])  Discharge Diagnoses: Patient Active Problem List   Diagnosis Date Noted  . Schizoaffective disorder, bipolar type ([place]) [F25.0]   . Suicidal ideation [R45.851]   . Menorrhagia [N92.0] 05/12/2014  . Personality disorder [F60.9] 10/17/2012  . Anemia, iron deficiency [D50.9] 10/13/2012  . Tachycardia [R00.0] 10/01/2012   Past Psychiatric History: Schizoaffective disorder, bipolar-type.  Past Medical History:  Past Medical History:  Diagnosis Date  . Paranoid behavior ([place])   . Schizophrenia [facility])     Past Surgical History:  Procedure Laterality Date  . CESAREAN SECTION     Family History:  Family History  Problem Relation Age of Onset  . Hypertension Other   . Diabetes Other   . Cancer Other    Family Psychiatric  History: See H&P  Social History:  History  Alcohol Use  . Yes    Comment: socially     History  Drug Use No    Social History   Social History  . Marital status: Legally Separated    Spouse name: N/A  . Number of children: N/A  . Years of education: N/A   Social History Main Topics  . Smoking status: Never Smoker  . Smokeless tobacco: Never Used  . Alcohol use Yes     Comment: socially  . Drug use: No  . Sexual activity: No   Other Topics Concern  . None   Social History Narrative  . None   Hospital Course: This is an admission assessment for this 41 year old African-American female with hx of chronic mental illness (Schizoaffective disorder, bipolar-type). She has  been a patient in this hospital x multiple times. She is well known to this unit & her hx of noncompliance to her treatment regimen well known. She is currently being admitted to this hospital under an involuntary commitment with complaints of erratic behaviors, paranoid ideations & psychosis. She is in need of mood stabilization treatments. During this assessment, Valerie Howard reports, "My name is Valerie Howard, I'm unemployed & divorced. I don't need to be here & I don't need to talk to you or discuss anything with you. If you check your records, you will see that I have been here too many times. And by the way, put this on my record, I don't want to be in contact with anyone. I have children".  Patient is a 8 year old AA female transferred under IVC from the [place] hospital to [facility] for mood stabilization treatments. She presented to the ED with  paranoid behaviors, delusional thinking and auditory/visual hallucinations. Patient also was very agitated, angry & uncooperative on her presentation to [facility]. She stated that there was nothing wrong with her & will not need any medications for any issues. She did report that she was not taking her medications as she did not feel she needed to be on any medications.  After evaluation of her presenting symptoms, it was determined that [facility] will benefit from mood stabilization treatments. The medication regimen targeting her presenting symptoms were discussed & initiated. However, Valerie Howard declined to take any medications.  She lacked insight on her mental health issues, how complicated it is & how negatively it has affected her personal, professional & family lives.  And because she needed medication regimen to re-stabilize her mania, paranoia & psychosis, a second opinion for force medications was recommended, evaluated & obtained. She was then started on Haldol 10 mg oral medication, including her other petinent home medications for her other medical issues. It was also decided  that [facility] will benefit from an injectable form of an antipsychotic medication that is given once a month. This will help her on the long run as she is known to be noncompliant to her treatment regimen. This will prevent the need for her to take daily medications & yet, keep her mental health stable & in check. So, yesterday 03-20-17, she received the very first dose of the Haldol Decanoate. This is will be due again in 28 days from yesterday (04-18-17).  Valerie Howard is no longer irritable, willing to have a conversation though she does present some paranoia. She kept to herself through her stay in this hospital. She will need to stay on recommended Haldol decanoate due to her long history of noncompliance to treatment regimen. And the chronic nature of irritability, delusional thinking and aggressiveness. Valerie Howard states that she is tired this morning as she received the Haldol decanoate. She adds that she feels calmer, states that she is ready to go home, does not seem to be responding to any internal stimuli. She presents much more pleasant & cooperative. She also denies any symptoms of depression, mania, any suicidal or homicidal ideation. She did refuse to take Cogentin & her high blood pressure medications.  Patient does have a history of chronic anemia, was not willing to see the hospitalist that was consulted, refused all the lab work ordered. Patient does need to be seen by her PCP for management of her chronic anemia & other medical issues that she has ongoing. She is going to be continuing mental health care on an outpatient basis as noted below. Valerie Howard was provided with all the necessary information needed to make this appointment without problems.   Upon discharge, Valerie Howard refused to sign her discharge papers. She declined to take her discharge instruction information including her prescriptions. She left [facility] with all personal belongings in no apparent distress. Transportation per taxi. [place] assisted with  taxi [place].  Physical Findings: AIMS: Facial and Oral Movements Muscles of Facial Expression: None, normal Lips and Perioral Area: None, normal Jaw: None, normal Tongue: None, normal,Extremity Movements Upper (arms, wrists, hands, fingers): None, normal Lower (legs, knees, ankles, toes): None, normal, Trunk Movements Neck, shoulders, hips: None, normal, Overall Severity Severity of abnormal movements (highest score from questions above): None, normal Incapacitation due to abnormal movements: None, normal Patient's awareness of abnormal movements (rate only patient's report): No Awareness, Dental Status Current problems with teeth and/or dentures?: No Does patient usually wear dentures?: No  CIWA:    COWS:     Musculoskeletal: Strength & Muscle Tone: within normal limits Gait & Station: normal Patient leans: N/A  Psychiatric Specialty Exam: Physical Exam  Constitutional: She appears well-developed.  HENT:  Head: Normocephalic.  Eyes: Pupils are equal, round, and reactive to light.  Neck: Normal range of motion.  Cardiovascular:  Hx. HTN, elevated heart rate  Respiratory: Effort normal.  GI: Soft.  Genitourinary:  Genitourinary Comments: Deferred  Musculoskeletal: Normal range of motion.  Neurological: She is alert.  Skin: Skin is warm and dry.    Review of  Systems  Constitutional: Negative.   HENT: Negative.   Eyes: Negative.   Respiratory: Negative.   Cardiovascular: Negative.   Gastrointestinal: Negative.   Genitourinary: Negative.   Musculoskeletal: Negative.   Skin: Negative.   Neurological: Negative.   Endo/Heme/Allergies: Negative.   Psychiatric/Behavioral: Positive for depression (Stable), hallucinations (Hx. Psychosis, mania) and substance abuse. Negative for memory loss and suicidal ideas. The patient has insomnia (Stable). The patient is not nervous/anxious.     Blood pressure 134/87, pulse (!) 116, temperature 98.7 F (37.1 C), temperature source  Oral, resp. rate 18, height 5' 7.5" (1.715 m), weight 78.9 kg (174 lb), SpO2 100 %.Body mass index is 26.85 kg/m.  See Md's SRA   Have you used any form of tobacco in the last 30 days? (Cigarettes, Smokeless Tobacco, Cigars, and/or Pipes): No  Has this patient used any form of tobacco in the last 30 days? (Cigarettes, Smokeless Tobacco, Cigars, and/or Pipes): Yes, Nicotine patch prescription provided upon discharge for smoking cessation.  Blood Alcohol level:  Lab Results  Component Value Date   ETH <5 03/07/2017   ETH <5 63/14/9702   Metabolic Disorder Labs:  No results found for: HGBA1C, MPG No results found for: PROLACTIN No results found for: CHOL, TRIG, HDL, CHOLHDL, VLDL, LDLCALC  See Psychiatric Specialty Exam and Suicide Risk Assessment completed by Attending Physician prior to discharge.  Discharge destination:  Home  Is patient on multiple antipsychotic therapies at discharge:  No   Has Patient had three or more failed trials of antipsychotic monotherapy by history:  No  Recommended Plan for Multiple Antipsychotic Therapies: NA  Allergies as of 03/21/2017      Reactions   Penicillins Rash   Has patient had a PCN reaction causing immediate rash, facial/tongue/throat swelling, SOB or lightheadedness with hypotension:Patient refuses to answer (PRA) Has patient had a PCN reaction causing severe rash involving mucus membranes or skin necrosis:PRA Has patient had a PCN reaction that required hospitalization PRA Has patient had a PCN reaction occurring within the last 10 years:PRA If all of the above answers are "NO", then may proceed with Cephalosporin use.      Medication List    STOP taking these medications   OLANZapine 10 MG tablet Commonly known as:  ZYPREXA     TAKE these medications     Indication  benztropine 0.5 MG tablet Commonly known as:  COGENTIN Take 1 tablet (0.5 mg total) by mouth 2 (two) times daily. For prevention of drug induced tremors   Indication:  Extrapyramidal Reaction caused by Medications   divalproex 500 MG 24 hr tablet Commonly known as:  DEPAKOTE ER Take 2 tablets (1,000 mg total) by mouth at bedtime. For mood stabilization  Indication:  Mood stabilization   docusate sodium 100 MG capsule Commonly known as:  COLACE Take 1 capsule (100 mg total) by mouth 2 (two) times daily. (May buy from over the counter at the pharmacy): For constipation  Indication:  Constipation   ferrous sulfate 325 (65 FE) MG tablet Take 1 tablet (325 mg total) by mouth 3 (three) times daily with meals. For low iron  Indication:  Anemia From Inadequate Iron in the Body   gabapentin 300 MG capsule Commonly known as:  NEURONTIN Take 1 capsule (300 mg total) by mouth 2 (two) times daily. For agitation  Indication:  Agitation   haloperidol 10 MG tablet Commonly known as:  HALDOL Take 1 tablet (10 mg total) by mouth 2 (two) times daily. For mood control  Indication:  Mood control   haloperidol decanoate 100 MG/ML injection Commonly known as:  HALDOL DECANOATE (Due on 04-18-17) Haloperidol Decanoate 100 mg/ML IM solution every 28 days: For mood control Start taking on:  04/18/2017  Indication:  Mood control   lisinopril 10 MG tablet Commonly known as:  PRINIVIL,ZESTRIL Take 1 tablet (10 mg total) by mouth daily. For high blood pressure  Indication:  High Blood Pressure Disorder   nicotine 21 mg/24hr patch Commonly known as:  NICODERM CQ - dosed in mg/24 hours Place 1 patch (21 mg total) onto the skin daily. For smoking cessation  Indication:  Nicotine Addiction      Follow-up Information    [facility] Follow up on 03/22/2017.   Specialty:  Behavioral Health Why:  Hospital discharge follow up appointment for assessment and referral for medications management on 7/25 at 8 AM.  Please call to cancel/reschedule if needed.  Contact information: [place] [address] [phone]          Follow-up  recommendations: Activity:  As tolerated Diet: As recommended by your primary care doctor. Keep all scheduled follow-up appointments as recommended.   Comments: Patient is instructed prior to discharge to: Take all medications as prescribed by his/her mental healthcare provider. Report any adverse effects and or reactions from the medicines to his/her outpatient provider promptly. Patient has been instructed & cautioned: To not engage in alcohol and or illegal drug use while on prescription medicines. In the event of worsening symptoms, patient is instructed to call the crisis hotline, 911 and or go to the nearest ED for appropriate evaluation and treatment of symptoms. To follow-up with his/her primary care provider for your other medical issues, concerns and or health care needs.   Signed: Encarnacion Slates, NP, PMHNP, FNP-BC 03/21/2017, 10:00 AM

## 2017-03-21 NOTE — Progress Notes (Signed)
D: Pt A & O X4. Denies SI, HI, AVH and pain. Presents with blunted affect and labile mood. D/C home as ordered. Cab voucher given. Pt refused to signed d/c papers and belonging sheet when offered; although she verbalized understanding of instructions. Pt also refused to take her d/c paper work including her prescriptions; "I don't need it, it's all trash, y'all ruin my life, I don't that shit". Shift AC was called to the search room for support and encouragement but pt still decline documents.  A: All medications administered as prescribed. D/C instructions reviewed with pt. All belongings from locker 36 returned to pt including security envelope with money. Routine safety checks maintained without self harm gestures till time of d/c.  R: Pt ambulatory off unit. Appears to be in no physical distress. D/C as ordered.

## 2017-03-21 NOTE — Plan of Care (Signed)
Problem: [facility] Participation in Recreation Therapeutic Interventions Goal: STG-Patient will attend/participate in Rec Therapy Group Ses STG-The Patient will attend and participate in Recreation Therapy Group Sessions  Outcome: Not Met (add Reason) Pt refused to attend and/or participate in recreation therapy group sessions.  Donovan Kail, Recreation Therapy Intern

## 2017-03-21 NOTE — Progress Notes (Signed)
  [facility] Adult Case Management Discharge Plan :  Will you be returning to the same living situation after discharge:  Yes,  Pt returning to her apartment At discharge, do you have transportation home?: Yes,  Pt provided with bus pass if family can not pick up Do you have the ability to pay for your medications: Yes,  Pt provided with samples and prescriptions  Release of information consent forms completed and in the chart;  Patient's signature needed at discharge.  Patient to Follow up at: Follow-up Information    [facility] Follow up on 03/22/2017.   Specialty:  Behavioral Health Why:  Hospital discharge follow up appointment for assessment and referral for medications management on 7/25 at 8 AM.  Please call to cancel/reschedule if needed.  Contact information: [place] [address] [phone]           Next level of care provider has access to [place] and Suicide Prevention discussed: Yes,  with Pt; declines family contact  Have you used any form of tobacco in the last 30 days? (Cigarettes, Smokeless Tobacco, Cigars, and/or Pipes): No  Has patient been referred to the Quitline?: N/A patient is not a smoker  Patient has been referred for addiction treatment: Yes  Gladstone Lighter 03/21/2017, 9:35 AM

## 2017-03-21 NOTE — Progress Notes (Signed)
Recreation Therapy Notes  Date: 03/21/2017 Time: 10:00am Location: [address] Dayroom  Group Topic: Exercise  Goal Area(s) Addresses:  Pt will be able to identify the benefits of exercising. Pt will be able to experience the benefits of exercising during the group.  Intervention: Exercise  Activity: Pt will throw two dice and depending on the number they roll they will do the exercise that corresponds with that number.  Education: Exercise, Discharge Planning  Education Outcome: Needs additional education  Clinical Observations/Feedback: Pt did not attend group.  Donovan Kail, Recreation Therapy Intern   Victorino Sparrow, LRT/CTRS

## 2017-03-21 NOTE — Progress Notes (Signed)
Patient reports to MHT "I just threw up". Emesis observed in the bathroom. Patient refuses Zofran by Probation officer. Ginger ale provided. Patient taking a shower.

## 2017-06-05 ENCOUNTER — Encounter (HOSPITAL_COMMUNITY): Payer: Self-pay | Admitting: Emergency Medicine

## 2017-06-05 ENCOUNTER — Ambulatory Visit (HOSPITAL_COMMUNITY)
Admission: EM | Admit: 2017-06-05 | Discharge: 2017-06-05 | Disposition: A | Discharge status: Home / Self Care | Payer: Self-pay | Attending: Family Medicine | Admitting: Family Medicine

## 2017-06-05 DIAGNOSIS — L910 Hypertrophic scar: Secondary | ICD-10-CM

## 2017-06-05 NOTE — ED Provider Notes (Signed)
  [place]   [id number] 06/05/17 Arrival Time: 1001  ASSESSMENT & PLAN:  1. Keloid of skin    She has seen dermatology before. Recommend that she schedule with them for evaluation. Reviewed expectations re: course of current medical issues. Questions answered. Outlined signs and symptoms indicating need for more acute intervention. Patient verbalized understanding. After Visit Summary given.   SUBJECTIVE:  Valerie Howard is a 41 y.o. female who presents with complaint of large keloids or R ear. Started after a piercing. Growing fairly rapidly. No pain. H/O removal in the past.  ROS: As per HPI.   OBJECTIVE:  Vitals:   06/05/17 1017  BP: (!) 145/95  Pulse: 95  Resp: 16  Temp: 99.3 F (37.4 C)  TempSrc: Oral  SpO2: 100%    General appearance: alert; no distress Skin: R ear with two posterior large keloids approx 3x3cm in siz Psychological: alert and cooperative; normal mood and affect   Allergies  Allergen Reactions  . Penicillins Rash    Has patient had a PCN reaction causing immediate rash, facial/tongue/throat swelling, SOB or lightheadedness with hypotension:Patient refuses to answer (PRA) Has patient had a PCN reaction causing severe rash involving mucus membranes or skin necrosis:PRA Has patient had a PCN reaction that required hospitalization PRA Has patient had a PCN reaction occurring within the last 10 years:PRA If all of the above answers are "NO", then may proceed with Cephalosporin use.     Past Medical History:  Diagnosis Date  . Paranoid behavior ([place])   . Schizophrenia [facility])    Social History   Social History  . Marital status: Legally Separated    Spouse name: N/A  . Number of children: N/A  . Years of education: N/A   Occupational History  . Not on file.   Social History Main Topics  . Smoking status: Never Smoker  . Smokeless tobacco: Never Used  . Alcohol use Yes     Comment: socially  . Drug use: No  . Sexual  activity: No   Other Topics Concern  . Not on file   Social History Narrative  . No narrative on file   Family History  Problem Relation Age of Onset  . Hypertension Other   . Diabetes Other   . Cancer Other    Past Surgical History:  Procedure Laterality Date  . CESAREAN SECTION       Vanessa Kick, MD 06/05/17 1036

## 2017-06-05 NOTE — ED Triage Notes (Signed)
Pt here wanting keloid scarring on right ear to be removed   Sx today include pain, itching and burning  Has been there x6 months and just getting bigger  Sts this occurred after ear piercing  A&O x4... NAD... Ambulatory

## 2017-07-10 ENCOUNTER — Encounter: Payer: Self-pay | Admitting: Radiation Oncology

## 2017-07-11 ENCOUNTER — Encounter: Payer: Self-pay | Admitting: Radiation Oncology

## 2017-07-11 NOTE — Progress Notes (Addendum)
Histology and Location of Keloid: right and left ear  Valerie Howard presented with a piercing at the [facility] over a year ago and noticed keloid formation shortly after she took her earring out.  Past/Anticipated interventions by patient's surgeon/dermatologist for current problematic lesion, if any: no.  SAFETY ISSUES:  Prior radiation? no  Pacemaker/ICD? no  Possible current pregnancy? no  Is the patient on methotrexate? no  Current Complaints / other details:  Dr. Marla Roe is recommending excision of the keloids of the posterior right ear with intraoperative kenalog injection, postoperative compression and radiation.  Surgery is scheduled for 07/17/17.  Patient reports pain at a 5/10 in her right ear.  She said it gives her headaches and is itching/burning.  BP 133/81 (BP Location: Right Arm, Patient Position: Sitting)   Pulse 88   Temp 99.3 F (37.4 C) (Oral)   Ht 5\' 7"  (1.702 m)   Wt 212 lb (96.2 kg)   LMP 07/06/2017   SpO2 100%   BMI 33.20 kg/m

## 2017-07-12 ENCOUNTER — Ambulatory Visit
Admission: RE | Admit: 2017-07-12 | Discharge: 2017-07-12 | Disposition: A | Discharge status: Home / Self Care | Payer: Medicaid Other | Source: Ambulatory Visit | Attending: Radiation Oncology | Admitting: Radiation Oncology

## 2017-07-12 ENCOUNTER — Ambulatory Visit: Payer: Self-pay | Admitting: Plastic Surgery

## 2017-07-12 ENCOUNTER — Encounter (HOSPITAL_BASED_OUTPATIENT_CLINIC_OR_DEPARTMENT_OTHER): Payer: Self-pay | Admitting: *Deleted

## 2017-07-12 ENCOUNTER — Other Ambulatory Visit: Payer: Self-pay

## 2017-07-12 ENCOUNTER — Encounter: Payer: Self-pay | Admitting: Radiation Oncology

## 2017-07-12 VITALS — BP 133/81 | HR 88 | Temp 99.3°F | Ht 67.0 in | Wt 212.0 lb

## 2017-07-12 DIAGNOSIS — F209 Schizophrenia, unspecified: Secondary | ICD-10-CM | POA: Diagnosis not present

## 2017-07-12 DIAGNOSIS — Z880 Allergy status to penicillin: Secondary | ICD-10-CM | POA: Diagnosis not present

## 2017-07-12 DIAGNOSIS — L910 Hypertrophic scar: Secondary | ICD-10-CM | POA: Insufficient documentation

## 2017-07-12 DIAGNOSIS — Z79899 Other long term (current) drug therapy: Secondary | ICD-10-CM | POA: Diagnosis not present

## 2017-07-12 HISTORY — DX: Hypertrophic scar: L91.0

## 2017-07-12 NOTE — H&P (View-Only) (Signed)
Valerie Howard is an 41 y.o. female.   Chief Complaint: right ear keloid HPI: The patient is a 41 y.o. yrs old bf here for a right ear keloid.  She got her ears pierced at the [facility] over a year ago and noticed keloid formation shortly after.  She has undergone steroid injections in the past without improvement.  The right is worse than the left.  It is pedunculated on the mid helix and ~ 9 cm in size with two different areas on the posterior lateral aspect.  There is a small on anteriorly.  There is no piercing in the site at this time.  There is no sign of infection.  She denies having it excised in the past.   She complains of difficulty sleeping due to the pressure and size.  Past Medical History:  Diagnosis Date  . Keloid    right ear  . Paranoid behavior ([place])   . Schizophrenia [facility])     Past Surgical History:  Procedure Laterality Date  . CESAREAN SECTION      Family History  Problem Relation Age of Onset  . Hypertension Other   . Diabetes Other   . Cancer Other    Social History:  reports that  has never smoked. she has never used smokeless tobacco. She reports that she drinks alcohol. She reports that she does not use drugs.  Allergies:  Allergies  Allergen Reactions  . Penicillins Rash    Has patient had a PCN reaction causing immediate rash, facial/tongue/throat swelling, SOB or lightheadedness with hypotension:Patient refuses to answer (PRA) Has patient had a PCN reaction causing severe rash involving mucus membranes or skin necrosis:PRA Has patient had a PCN reaction that required hospitalization PRA Has patient had a PCN reaction occurring within the last 10 years:PRA If all of the above answers are "NO", then may proceed with Cephalosporin use.      (Not in a hospital admission)  No results found for this or any previous visit (from the past 48 hour(s)). No results found.  Review of Systems  Constitutional: Negative.   HENT: Negative.   Eyes: Negative.     Respiratory: Negative.   Cardiovascular: Negative.   Gastrointestinal: Negative.   Genitourinary: Negative.   Musculoskeletal: Negative.   Skin: Negative.   Neurological: Negative.   Psychiatric/Behavioral: Negative.     There were no vitals taken for this visit. Physical Exam  Constitutional: She is oriented to person, place, and time. She appears well-developed and well-nourished.  HENT:  Head: Normocephalic and atraumatic.  Ears:  Eyes: EOM are normal. Pupils are equal, round, and reactive to light.  Cardiovascular: Normal rate.  Respiratory: Effort normal.  GI: Soft. She exhibits no distension.  Neurological: She is alert and oriented to person, place, and time.  Skin: Skin is warm.  Psychiatric: She has a normal mood and affect. Her behavior is normal. Judgment and thought content normal.     Assessment/Plan Recommend excision of the keloids of the posterior right ear with intraoperative kenalog injection, postoperative compression and radiation.  Massage to start 2 weeks latera.  We discussed the extremely high chance of recurrence and suggested that she not got any additional piercing.  Even with the change of occurring again, she would like to have the lesions excised knowing that they can come back larger  Wallace Going, DO 07/12/2017, 8:07 AM

## 2017-07-12 NOTE — H&P (Signed)
Valerie Howard is an 41 y.o. female.   Chief Complaint: right ear keloid HPI: The patient is a 41 y.o. yrs old bf here for a right ear keloid.  She got her ears pierced at the helix over a year ago and noticed keloid formation shortly after.  She has undergone steroid injections in the past without improvement.  The right is worse than the left.  It is pedunculated on the mid helix and ~ 9 cm in size with two different areas on the posterior lateral aspect.  There is a small on anteriorly.  There is no piercing in the site at this time.  There is no sign of infection.  She denies having it excised in the past.   She complains of difficulty sleeping due to the pressure and size.  Past Medical History:  Diagnosis Date  . Keloid    right ear  . Paranoid behavior ([place])   . Schizophrenia [facility] [place])     Past Surgical History:  Procedure Laterality Date  . CESAREAN SECTION      Family History  Problem Relation Age of Onset  . Hypertension Other   . Diabetes Other   . Cancer Other    Social History:  reports that  has never smoked. she has never used smokeless tobacco. She reports that she drinks alcohol. She reports that she does not use drugs.  Allergies:  Allergies  Allergen Reactions  . Penicillins Rash    Has patient had a PCN reaction causing immediate rash, facial/tongue/throat swelling, SOB or lightheadedness with hypotension:Patient refuses to answer (PRA) Has patient had a PCN reaction causing severe rash involving mucus membranes or skin necrosis:PRA Has patient had a PCN reaction that required hospitalization PRA Has patient had a PCN reaction occurring within the last 10 years:PRA If all of the above answers are "NO", then may proceed with Cephalosporin use.      (Not in a hospital admission)  No results found for this or any previous visit (from the past 48 hour(s)). No results found.  Review of Systems  Constitutional: Negative.   HENT: Negative.   Eyes: Negative.     Respiratory: Negative.   Cardiovascular: Negative.   Gastrointestinal: Negative.   Genitourinary: Negative.   Musculoskeletal: Negative.   Skin: Negative.   Neurological: Negative.   Psychiatric/Behavioral: Negative.     There were no vitals taken for this visit. Physical Exam  Constitutional: She is oriented to person, place, and time. She appears well-developed and well-nourished.  HENT:  Head: Normocephalic and atraumatic.  Ears:  Eyes: EOM are normal. Pupils are equal, round, and reactive to light.  Cardiovascular: Normal rate.  Respiratory: Effort normal.  GI: Soft. She exhibits no distension.  Neurological: She is alert and oriented to person, place, and time.  Skin: Skin is warm.  Psychiatric: She has a normal mood and affect. Her behavior is normal. Judgment and thought content normal.     Assessment/Plan Recommend excision of the keloids of the posterior right ear with intraoperative kenalog injection, postoperative compression and radiation.  Massage to start 2 weeks latera.  We discussed the extremely high chance of recurrence and suggested that she not got any additional piercing.  Even with the change of occurring again, she would like to have the lesions excised knowing that they can come back larger  Wallace Going, DO 07/12/2017, 8:07 AM

## 2017-07-12 NOTE — Progress Notes (Signed)
Patient scheduled for surgery 07-17-17 for keloid removal by Dr Marla Roe and I called her for Pre-op info. She has a history of severe schizophrenia and paranoid behavior with several admissions for same. She has several meds listed to treat her illnes as well as HTN. Patient tells me" I don't take any medications, I dont have anything wrong with me" I did mention her meds and she repeated that she didn't have anything wrong with her and took no meds. I gave her info to arrive DOS at 12, states her daughter Daleen Bo will bring her.

## 2017-07-12 NOTE — Progress Notes (Signed)
Radiation Oncology         [phone] ________________________________  Initial Outpatient Consultation  Name: Valerie Howard MRN: [id number]  Date: 07/12/2017  DOB: May 05, 1976  DZ:[id number], No Pcp Per  Dillingham, Loel Lofty, DO   REFERRING PHYSICIAN: Wallace Going, DO  DIAGNOSIS: Hypertrophic scars (keloid) of the bilateral ears.   HISTORY OF PRESENT ILLNESS: Valerie Howard is a 41 y.o. female who is here to discuss post-operative radiation therapy following planned bilateral keloid removal from both of her ears.   Initially, the patient reports that she had her bilateral helices pierced approximately one year ago. She noticed some discomfort to the right piercing several days later and removed the piercing at that time. Following this, she noticed the keloid formation which has been gradually worsening since. Additionally, she reports that she removed the left piercing several months after the right and she has noticed a small area of keloid formation from this area as well. She has previously undergone steroid injections at these sites without interval improvement. She denies any overt pain over the area, however, she reports that it is difficult to sleep on this side secondary to pressure and size. Additionally, she reports that area will intermittently cause headaches, and she describes the occasional pain over the area as itching/burning. She has never previously had these areas excised. She met with Dr Marla Roe recently who recommended surgical removal of the area followed by post-surgical radiation within the treatment bed.    On review of systems, pt denies fever, chills, rash, mouth sores, weight loss, decreased appetite, urinary complaints. Denies pain. Pt denies abdominal pain, nausea, vomiting. Positive for that above in the HPI.   PREVIOUS RADIATION THERAPY: No  PAST MEDICAL HISTORY:  has a past medical history of Keloid, Paranoid behavior ([place]), and Schizophrenia  ([place]).    PAST SURGICAL HISTORY: Past Surgical History:  Procedure Laterality Date  . CESAREAN SECTION    . TUBAL LIGATION      FAMILY HISTORY: family history includes Breast cancer in her maternal grandmother; Cancer in her other; Diabetes in her other; Hypertension in her other; Keloids in her mother.  SOCIAL HISTORY:  reports that  has never smoked. she has never used smokeless tobacco. She reports that she drinks alcohol. She reports that she does not use drugs.  ALLERGIES: Penicillins  MEDICATIONS:  Current Outpatient Medications  Medication Sig Dispense Refill  . benztropine (COGENTIN) 0.5 MG tablet Take 1 tablet (0.5 mg total) by mouth 2 (two) times daily. For prevention of drug induced tremors (Patient not taking: Reported on 07/12/2017) 60 tablet 0  . divalproex (DEPAKOTE ER) 500 MG 24 hr tablet Take 2 tablets (1,000 mg total) by mouth at bedtime. For mood stabilization (Patient not taking: Reported on 07/12/2017) 60 tablet 0  . docusate sodium (COLACE) 100 MG capsule Take 1 capsule (100 mg total) by mouth 2 (two) times daily. (May buy from over the counter at the pharmacy): For constipation (Patient not taking: Reported on 07/12/2017) 1 capsule 0  . ferrous sulfate 325 (65 FE) MG tablet Take 1 tablet (325 mg total) by mouth 3 (three) times daily with meals. For low iron (Patient not taking: Reported on 07/12/2017) 30 tablet 0  . gabapentin (NEURONTIN) 300 MG capsule Take 1 capsule (300 mg total) by mouth 2 (two) times daily. For agitation (Patient not taking: Reported on 07/12/2017) 60 capsule 0  . haloperidol (HALDOL) 10 MG tablet Take 1 tablet (10 mg total) by mouth 2 (two) times  daily. For mood control (Patient not taking: Reported on 07/12/2017) 60 tablet 0  . haloperidol decanoate (HALDOL DECANOATE) 100 MG/ML injection (Due on 04-18-17) Haloperidol Decanoate 100 mg/ML IM solution every 28 days: For mood control (Patient not taking: Reported on 07/12/2017) 1 mL 0  . lisinopril  (PRINIVIL,ZESTRIL) 10 MG tablet Take 1 tablet (10 mg total) by mouth daily. For high blood pressure (Patient not taking: Reported on 07/12/2017) 10 tablet 0  . nicotine (NICODERM CQ - DOSED IN MG/24 HOURS) 21 mg/24hr patch Place 1 patch (21 mg total) onto the skin daily. For smoking cessation (Patient not taking: Reported on 07/12/2017) 28 patch 0   No current facility-administered medications for this encounter.     REVIEW OF SYSTEMS:  A 10+ POINT REVIEW OF SYSTEMS WAS OBTAINED including neurology, dermatology, psychiatry, cardiac, respiratory, lymph, extremities, GI, GU, Musculoskeletal, constitutional, breasts, reproductive, HEENT.  All pertinent positives are noted in the HPI.  All others are negative.   PHYSICAL EXAM:  height is _0  (1.702 m) and weight is 212 lb (96.2 kg). Her oral temperature is 99.3 F (37.4 C). Her blood pressure is 133/81 and her pulse is 88. Her oxygen saturation is 100%.   Lungs are clear to auscultation bilaterally. Heart has regular rate and rhythm. No palpable cervical, supraclavicular, or axillary adenopathy. Abdomen soft, non-tender, normal bowel sounds. 7cm x 4.5cm trilobed hypertrophic scar (keloid) on the right ear, involving the anteportion of the ear with a smaller portion and the larger of which involves the posterior portion of the ear. Additional small hypertrophic scar is seen involving the left helix which measures 51m in the greatest dimension.  ECOG = 1  LABORATORY DATA:  Lab Results  Component Value Date   WBC 7.0 03/07/2017   HGB 7.6 (L) 03/07/2017   HCT 26.8 (L) 03/07/2017   MCV 66.3 (L) 03/07/2017   PLT 437 (H) 03/07/2017   NEUTROABS 2.6 07/05/2015   Lab Results  Component Value Date   NA 139 03/07/2017   K 3.5 03/07/2017   CL 109 03/07/2017   CO2 21 (L) 03/07/2017   GLUCOSE 104 (H) 03/07/2017   CREATININE 0.83 03/07/2017   CALCIUM 9.2 03/07/2017     RADIOGRAPHY: No results found.    IMPRESSION: OBURGANDY HACKWORTHis a very  pleasant 41y.o. female who presents today to discuss the role of radiotherapy in the ongoing management of their hypertrophic scarring (keloids) of the bilateral ears. Hypertrophic scars (keloids) involving the earlobes related to ear piercing. She is quite symptomatic on the right side, and she would be a good candidate for surgery and post radiation therapy to reduce her recurrence. The risks vs benefits, side effects, and potential toxicities of post op radiotherapy were discussed at great length with the patient. The patient appears to understand and wishes to proceed with planned course of treatment.   PLAN: The patient will proceed with surgery late Monday morning and return to our clinic Monday afternoon for planning, with first treatment to occur approximately 24hrs following her surgery. The pt will receive either 2 or 3 post-op radiotherapy treatments to post-op surgical beds.     ------------------------------------------------  JBlair Promise PhD, MD  This document serves as a record of services personally performed by JGery Pray MD. It was created on his behalf by WReola Mosher a trained medical scribe. The creation of this record is based on the scribe's personal observations and the provider's statements to them. This document has been checked  and approved by the attending provider.

## 2017-07-14 ENCOUNTER — Ambulatory Visit: Payer: Self-pay | Admitting: Plastic Surgery

## 2017-07-14 DIAGNOSIS — L910 Hypertrophic scar: Secondary | ICD-10-CM

## 2017-07-17 ENCOUNTER — Ambulatory Visit (HOSPITAL_BASED_OUTPATIENT_CLINIC_OR_DEPARTMENT_OTHER)
Admission: RE | Admit: 2017-07-17 | Discharge: 2017-07-17 | Disposition: A | Discharge status: Home / Self Care | Payer: Medicaid Other | Source: Ambulatory Visit | Attending: Plastic Surgery | Admitting: Plastic Surgery

## 2017-07-17 ENCOUNTER — Ambulatory Visit (HOSPITAL_BASED_OUTPATIENT_CLINIC_OR_DEPARTMENT_OTHER): Payer: Medicaid Other | Admitting: Anesthesiology

## 2017-07-17 ENCOUNTER — Ambulatory Visit
Admission: RE | Admit: 2017-07-17 | Discharge: 2017-07-17 | Disposition: A | Discharge status: Home / Self Care | Payer: Medicaid Other | Source: Ambulatory Visit | Attending: Radiation Oncology | Admitting: Radiation Oncology

## 2017-07-17 ENCOUNTER — Other Ambulatory Visit: Payer: Self-pay

## 2017-07-17 ENCOUNTER — Encounter (HOSPITAL_BASED_OUTPATIENT_CLINIC_OR_DEPARTMENT_OTHER)
Admission: RE | Disposition: A | Discharge status: Home / Self Care | Payer: Self-pay | Source: Ambulatory Visit | Attending: Plastic Surgery

## 2017-07-17 ENCOUNTER — Encounter (HOSPITAL_BASED_OUTPATIENT_CLINIC_OR_DEPARTMENT_OTHER): Payer: Self-pay

## 2017-07-17 DIAGNOSIS — F209 Schizophrenia, unspecified: Secondary | ICD-10-CM | POA: Diagnosis not present

## 2017-07-17 DIAGNOSIS — Z6835 Body mass index (BMI) 35.0-35.9, adult: Secondary | ICD-10-CM | POA: Insufficient documentation

## 2017-07-17 DIAGNOSIS — L910 Hypertrophic scar: Secondary | ICD-10-CM | POA: Diagnosis not present

## 2017-07-17 DIAGNOSIS — D649 Anemia, unspecified: Secondary | ICD-10-CM | POA: Insufficient documentation

## 2017-07-17 DIAGNOSIS — Z79899 Other long term (current) drug therapy: Secondary | ICD-10-CM | POA: Diagnosis not present

## 2017-07-17 DIAGNOSIS — E669 Obesity, unspecified: Secondary | ICD-10-CM | POA: Insufficient documentation

## 2017-07-17 HISTORY — PX: MASS EXCISION: SHX2000

## 2017-07-17 HISTORY — PX: KENALOG INJECTION: SHX5298

## 2017-07-17 SURGERY — EXCISION MASS
Anesthesia: General | Site: Ear

## 2017-07-17 MED ORDER — ONDANSETRON HCL 4 MG/2ML IJ SOLN
INTRAMUSCULAR | Status: DC | PRN
  Administered 2017-07-17: 4 mg via INTRAVENOUS

## 2017-07-17 MED ORDER — PROPOFOL 10 MG/ML IV BOLUS
INTRAVENOUS | Status: DC | PRN
  Administered 2017-07-17: 200 mg via INTRAVENOUS

## 2017-07-17 MED ORDER — SCOPOLAMINE 1 MG/3DAYS TD PT72: 1.0000 | MEDICATED_PATCH | Freq: Once | TRANSDERMAL | Status: DC | PRN

## 2017-07-17 MED ORDER — FENTANYL CITRATE (PF) 100 MCG/2ML IJ SOLN
25.0000 ug | INTRAMUSCULAR | Status: DC | PRN
  Administered 2017-07-17: 50 ug via INTRAVENOUS

## 2017-07-17 MED ORDER — LIDOCAINE HCL (CARDIAC) 20 MG/ML IV SOLN
INTRAVENOUS | Status: DC | PRN
  Administered 2017-07-17: 100 mg via INTRAVENOUS

## 2017-07-17 MED ORDER — SODIUM CHLORIDE 0.9% FLUSH: 3.0000 mL | INTRAVENOUS | Status: DC | PRN

## 2017-07-17 MED ORDER — SODIUM CHLORIDE 0.9% FLUSH: 3.0000 mL | Freq: Two times a day (BID) | INTRAVENOUS | Status: DC

## 2017-07-17 MED ORDER — CIPROFLOXACIN IN D5W 400 MG/200ML IV SOLN
INTRAVENOUS | Status: AC
  Filled 2017-07-17: qty 200

## 2017-07-17 MED ORDER — SODIUM CHLORIDE 0.9 % IV SOLN: 250.0000 mL | INTRAVENOUS | Status: DC | PRN

## 2017-07-17 MED ORDER — ACETAMINOPHEN 650 MG RE SUPP: 650.0000 mg | RECTAL | Status: DC | PRN

## 2017-07-17 MED ORDER — FENTANYL CITRATE (PF) 100 MCG/2ML IJ SOLN
50.0000 ug | INTRAMUSCULAR | Status: DC | PRN
  Administered 2017-07-17: 100 ug via INTRAVENOUS

## 2017-07-17 MED ORDER — BUPIVACAINE-EPINEPHRINE 0.25% -1:200000 IJ SOLN
INTRAMUSCULAR | Status: DC | PRN
  Administered 2017-07-17: 2 mL

## 2017-07-17 MED ORDER — DEXAMETHASONE SODIUM PHOSPHATE 4 MG/ML IJ SOLN
INTRAMUSCULAR | Status: DC | PRN
  Administered 2017-07-17: 10 mg via INTRAVENOUS

## 2017-07-17 MED ORDER — OXYCODONE HCL 5 MG PO TABS
5.0000 mg | ORAL_TABLET | ORAL | Status: DC | PRN
  Administered 2017-07-17: 5 mg via ORAL

## 2017-07-17 MED ORDER — MIDAZOLAM HCL 2 MG/2ML IJ SOLN
1.0000 mg | INTRAMUSCULAR | Status: DC | PRN
  Administered 2017-07-17: 2 mg via INTRAVENOUS

## 2017-07-17 MED ORDER — ONDANSETRON HCL 4 MG/2ML IJ SOLN: 4.0000 mg | Freq: Once | INTRAMUSCULAR | Status: DC | PRN

## 2017-07-17 MED ORDER — FENTANYL CITRATE (PF) 100 MCG/2ML IJ SOLN
INTRAMUSCULAR | Status: AC
  Filled 2017-07-17: qty 2

## 2017-07-17 MED ORDER — OXYCODONE HCL 5 MG PO TABS
ORAL_TABLET | ORAL | Status: AC
  Filled 2017-07-17: qty 1

## 2017-07-17 MED ORDER — LACTATED RINGERS IV SOLN
INTRAVENOUS | Status: DC
Start: 2017-07-17 — End: 2017-07-17
  Administered 2017-07-17 (×2): via INTRAVENOUS

## 2017-07-17 MED ORDER — CIPROFLOXACIN IN D5W 400 MG/200ML IV SOLN
400.0000 mg | INTRAVENOUS | Status: AC
  Administered 2017-07-17 (×2): 400 mg via INTRAVENOUS

## 2017-07-17 MED ORDER — ACETAMINOPHEN 325 MG PO TABS: 650.0000 mg | ORAL_TABLET | ORAL | Status: DC | PRN

## 2017-07-17 MED ORDER — MIDAZOLAM HCL 2 MG/2ML IJ SOLN
INTRAMUSCULAR | Status: AC
  Filled 2017-07-17: qty 2

## 2017-07-17 MED ORDER — TRIAMCINOLONE ACETONIDE 40 MG/ML IJ SUSP
INTRAMUSCULAR | Status: DC | PRN
  Administered 2017-07-17: .4 mL via INTRAMUSCULAR

## 2017-07-17 SURGICAL SUPPLY — 69 items
ADH SKN CLS APL DERMABOND .7 (GAUZE/BANDAGES/DRESSINGS)
APL SKNCLS STERI-STRIP NONHPOA (GAUZE/BANDAGES/DRESSINGS)
BENZOIN TINCTURE PRP APPL 2/3 (GAUZE/BANDAGES/DRESSINGS) IMPLANT
BLADE CLIPPER SURG (BLADE) IMPLANT
BLADE SURG 15 STRL LF DISP TIS (BLADE) ×2 IMPLANT
BLADE SURG 15 STRL SS (BLADE) ×4
BNDG CONFORM 2 STRL LF (GAUZE/BANDAGES/DRESSINGS) IMPLANT
BNDG ELASTIC 2X5.8 VLCR STR LF (GAUZE/BANDAGES/DRESSINGS) IMPLANT
CANISTER SUCT 1200ML W/VALVE (MISCELLANEOUS) ×3 IMPLANT
CHLORAPREP W/TINT 26ML (MISCELLANEOUS) IMPLANT
CLEANER CAUTERY TIP 5X5 PAD (MISCELLANEOUS) IMPLANT
CLOSURE WOUND 1/2 X4 (GAUZE/BANDAGES/DRESSINGS)
CORD BIPOLAR FORCEPS 12FT (ELECTRODE) IMPLANT
COVER BACK TABLE 60X90IN (DRAPES) ×4 IMPLANT
COVER MAYO STAND STRL (DRAPES) ×2 IMPLANT
DECANTER SPIKE VIAL GLASS SM (MISCELLANEOUS) IMPLANT
DERMABOND ADVANCED (GAUZE/BANDAGES/DRESSINGS)
DERMABOND ADVANCED .7 DNX12 (GAUZE/BANDAGES/DRESSINGS) IMPLANT
DRAPE LAPAROTOMY 100X72 PEDS (DRAPES) IMPLANT
DRAPE U-SHAPE 76X120 STRL (DRAPES) ×4 IMPLANT
DRSG TEGADERM 2-3/8X2-3/4 SM (GAUZE/BANDAGES/DRESSINGS) IMPLANT
DRSG TEGADERM 4X4.75 (GAUZE/BANDAGES/DRESSINGS) IMPLANT
ELECT COATED BLADE 2.86 ST (ELECTRODE) IMPLANT
ELECT NDL BLADE 2-5/6 (NEEDLE) IMPLANT
ELECT NEEDLE BLADE 2-5/6 (NEEDLE) IMPLANT
ELECT REM PT RETURN 9FT ADLT (ELECTROSURGICAL)
ELECT REM PT RETURN 9FT PED (ELECTROSURGICAL)
ELECTRODE REM PT RETRN 9FT PED (ELECTROSURGICAL) IMPLANT
ELECTRODE REM PT RTRN 9FT ADLT (ELECTROSURGICAL) IMPLANT
GAUZE SPONGE 4X4 12PLY STRL LF (GAUZE/BANDAGES/DRESSINGS) IMPLANT
GLOVE BIO SURGEON STRL SZ 6.5 (GLOVE) ×6 IMPLANT
GLOVE BIO SURGEONS STRL SZ 6.5 (GLOVE) ×2
GOWN STRL REUS W/ TWL LRG LVL3 (GOWN DISPOSABLE) ×4 IMPLANT
GOWN STRL REUS W/TWL LRG LVL3 (GOWN DISPOSABLE) ×8
NDL HYPO 30GX1 BEV (NEEDLE) IMPLANT
NDL PRECISIONGLIDE 27X1.5 (NEEDLE) ×1 IMPLANT
NEEDLE HYPO 30GX1 BEV (NEEDLE) IMPLANT
NEEDLE PRECISIONGLIDE 27X1.5 (NEEDLE) ×4 IMPLANT
NS IRRIG 1000ML POUR BTL (IV SOLUTION) IMPLANT
PACK BASIN DAY SURGERY FS (CUSTOM PROCEDURE TRAY) ×4 IMPLANT
PAD CLEANER CAUTERY TIP 5X5 (MISCELLANEOUS)
PENCIL BUTTON HOLSTER BLD 10FT (ELECTRODE) IMPLANT
RUBBERBAND STERILE (MISCELLANEOUS) IMPLANT
SHEET MEDIUM DRAPE 40X70 STRL (DRAPES) IMPLANT
SLEEVE SCD COMPRESS KNEE MED (MISCELLANEOUS) IMPLANT
SPONGE GAUZE 2X2 8PLY STER LF (GAUZE/BANDAGES/DRESSINGS)
SPONGE GAUZE 2X2 8PLY STRL LF (GAUZE/BANDAGES/DRESSINGS) IMPLANT
STRIP CLOSURE SKIN 1/2X4 (GAUZE/BANDAGES/DRESSINGS) IMPLANT
SUCTION FRAZIER HANDLE 10FR (MISCELLANEOUS)
SUCTION TUBE FRAZIER 10FR DISP (MISCELLANEOUS) IMPLANT
SUT MNCRL 6-0 UNDY P1 1X18 (SUTURE) ×2 IMPLANT
SUT MNCRL AB 3-0 PS2 18 (SUTURE) IMPLANT
SUT MNCRL AB 4-0 PS2 18 (SUTURE) IMPLANT
SUT MON AB 5-0 P3 18 (SUTURE) IMPLANT
SUT MON AB 5-0 PS2 18 (SUTURE) IMPLANT
SUT MONOCRYL 6-0 P1 1X18 (SUTURE) ×2
SUT PROLENE 5 0 P 3 (SUTURE) IMPLANT
SUT PROLENE 5 0 PS 2 (SUTURE) IMPLANT
SUT PROLENE 6 0 P 1 18 (SUTURE) IMPLANT
SUT VIC AB 5-0 P-3 18X BRD (SUTURE) IMPLANT
SUT VIC AB 5-0 P3 18 (SUTURE)
SUT VIC AB 5-0 PS2 18 (SUTURE) IMPLANT
SUT VICRYL 4-0 PS2 18IN ABS (SUTURE) IMPLANT
SYR BULB 3OZ (MISCELLANEOUS) IMPLANT
SYR CONTROL 10ML LL (SYRINGE) ×4 IMPLANT
TOWEL OR 17X24 6PK STRL BLUE (TOWEL DISPOSABLE) ×4 IMPLANT
TRAY DSU PREP LF (CUSTOM PROCEDURE TRAY) ×4 IMPLANT
TUBE CONNECTING 20'X1/4 (TUBING)
TUBE CONNECTING 20X1/4 (TUBING) IMPLANT

## 2017-07-17 NOTE — Transfer of Care (Signed)
Immediate Anesthesia Transfer of Care Note  Patient: Valerie Howard  Procedure(s) Performed: EXCISION KELOID OF RIGHT EAR WITH INTROPERATIVE KENALOG INJECTION AND POST OP RADIATION (N/A Ear) KENALOG INJECTION (Bilateral Ear)  Patient Location: PACU  Anesthesia Type:General  Level of Consciousness: awake, sedated and patient cooperative  Airway & Oxygen Therapy: Patient Spontanous Breathing  Post-op Assessment: Report given to RN and Post -op Vital signs reviewed and stable  Post vital signs: Reviewed and stable  Last Vitals:  Vitals:   07/17/17 1027  BP: 122/75  Pulse: 92  Resp: 18  Temp: 37.1 C  SpO2: 99%    Last Pain:  Vitals:   07/17/17 1027  TempSrc: Oral  PainSc: 5          Complications: No apparent anesthesia complications

## 2017-07-17 NOTE — Discharge Instructions (Signed)
°  Post Anesthesia Home Care Instructions  Activity: Get plenty of rest for the remainder of the day. A responsible individual must stay with you for 24 hours following the procedure.  For the next 24 hours, DO NOT: -Drive a car -Paediatric nurse -Drink alcoholic beverages -Take any medication unless instructed by your physician -Make any legal decisions or sign important papers.  Meals: Start with liquid foods such as gelatin or soup. Progress to regular foods as tolerated. Avoid greasy, spicy, heavy foods. If nausea and/or vomiting occur, drink only clear liquids until the nausea and/or vomiting subsides. Call your physician if vomiting continues.  Special Instructions/Symptoms: Your throat may feel dry or sore from the anesthesia or the breathing tube placed in your throat during surgery. If this causes discomfort, gargle with warm salt water. The discomfort should disappear within 24 hours.  If you had a scopolamine patch placed behind your ear for the management of post- operative nausea and/or vomiting:  1. The medication in the patch is effective for 72 hours, after which it should be removed.  Wrap patch in a tissue and discard in the trash. Wash hands thoroughly with soap and water. 2. You may remove the patch earlier than 72 hours if you experience unpleasant side effects which may include dry mouth, dizziness or visual disturbances. 3. Avoid touching the patch. Wash your hands with soap and water after contact with the patch.   Radiation treatment. Vaseline to the incision sites if ok with radiation. May shower today.     Next Pain med due at 5:30  Post Anesthesia Home Care Instructions  Activity: Get plenty of rest for the remainder of the day. A responsible individual must stay with you for 24 hours following the procedure.  For the next 24 hours, DO NOT: -Drive a car -Paediatric nurse -Drink alcoholic beverages -Take any medication unless instructed by your  physician -Make any legal decisions or sign important papers.  Meals: Start with liquid foods such as gelatin or soup. Progress to regular foods as tolerated. Avoid greasy, spicy, heavy foods. If nausea and/or vomiting occur, drink only clear liquids until the nausea and/or vomiting subsides. Call your physician if vomiting continues.  Special Instructions/Symptoms: Your throat may feel dry or sore from the anesthesia or the breathing tube placed in your throat during surgery. If this causes discomfort, gargle with warm salt water. The discomfort should disappear within 24 hours.  If you had a scopolamine patch placed behind your ear for the management of post- operative nausea and/or vomiting:  1. The medication in the patch is effective for 72 hours, after which it should be removed.  Wrap patch in a tissue and discard in the trash. Wash hands thoroughly with soap and water. 2. You may remove the patch earlier than 72 hours if you experience unpleasant side effects which may include dry mouth, dizziness or visual disturbances. 3. Avoid touching the patch. Wash your hands with soap and water after contact with

## 2017-07-17 NOTE — Op Note (Signed)
DATE OF OPERATION: 07/17/2017  LOCATION: [facility]  PREOPERATIVE DIAGNOSIS: right ear keloids  POSTOPERATIVE DIAGNOSIS: Same  PROCEDURE: Excision of right ear helix keloids: 1. Posterior superior 3 x 3 cm 2. Posterior inferior 3 x 4 cm 3. Anterior 1 x 2 cm  SURGEON: Claire Sanger Dillingham, DO  ASSISTANT: Dr. Heber Sierra View, MD  EBL: 5 cc  CONDITION: Stable  COMPLICATIONS: None  INDICATION: The patient, Valerie Howard, is a 41 y.o. female born on 1976/06/20, is here for treatment of chronic right ear keloids.   PROCEDURE DETAILS:  The patient was seen prior to surgery and marked.  The IV antibiotics were given. The patient was taken to the operating room and given a general anesthetic. A standard time out was performed and all information was confirmed by those in the room. SCDs were placed.   Local was injected in the base of the keloids for intraoperative hemostasis and postoperative pain control.  The #15 blade was used to excise the posterior superior 3 x 3 cm keloid and then thin it for closure.  Hemostasis was achieved with electrocautery.  The #15 blade was then used to excise the posterior inferior keloid 3 x 4 cm.  To gain a better closure it was connected with the superior lesion.  The 6-0 Monocryl was used to close the two together with subcutaneous and running suture. Attention was turned to the anterior lesion.  This 1 x 2 cm lesion was excised in an elliptical fashion.  The base was bovied to obtain hemostasis.  The 6-0 Monocryl was used to close the incision with a running subcuticular closure.  Kenalog was injected into the base of all areas and then the helix keloid of the left ear. The patient was allowed to wake up and taken to recovery room in stable condition at the end of the case. The family was notified at the end of the case.

## 2017-07-17 NOTE — Anesthesia Postprocedure Evaluation (Signed)
Anesthesia Post Note  Patient: Valerie Howard  Procedure(s) Performed: EXCISION KELOID OF RIGHT EAR WITH INTROPERATIVE KENALOG INJECTION AND POST OP RADIATION (N/A Ear) KENALOG INJECTION (Bilateral Ear)     Patient location during evaluation: PACU Anesthesia Type: General Level of consciousness: awake and alert and oriented Pain management: pain level controlled Vital Signs Assessment: post-procedure vital signs reviewed and stable Respiratory status: spontaneous breathing, nonlabored ventilation and respiratory function stable Cardiovascular status: blood pressure returned to baseline and stable Postop Assessment: no apparent nausea or vomiting Anesthetic complications: no    Last Vitals:  Vitals:   07/17/17 1245 07/17/17 1300  BP: 120/80 101/62  Pulse: 94 92  Resp: 20 17  Temp:    SpO2: 96% 99%    Last Pain:  Vitals:   07/17/17 1300  TempSrc:   PainSc: 7                  Keirsten Matuska A.

## 2017-07-17 NOTE — Anesthesia Procedure Notes (Signed)
Procedure Name: LMA Insertion Date/Time: 07/17/2017 11:37 AM Performed by: Willa Frater, CRNA Pre-anesthesia Checklist: Patient identified, Emergency Drugs available, Suction available and Patient being monitored Patient Re-evaluated:Patient Re-evaluated prior to induction Oxygen Delivery Method: Circle system utilized Preoxygenation: Pre-oxygenation with 100% oxygen Induction Type: IV induction Ventilation: Mask ventilation without difficulty LMA: LMA inserted LMA Size: 4.0 Number of attempts: 1 Airway Equipment and Method: Bite block Placement Confirmation: positive ETCO2 Tube secured with: Tape Dental Injury: Teeth and Oropharynx as per pre-operative assessment

## 2017-07-17 NOTE — Anesthesia Preprocedure Evaluation (Addendum)
Anesthesia Evaluation  Patient identified by MRN, date of birth, ID band Patient awake    Reviewed: Allergy & Precautions, NPO status , Patient's Chart, lab work & pertinent test results  History of Anesthesia Complications Negative for: history of anesthetic complications  Airway Mallampati: II  TM Distance: >3 FB Neck ROM: Full    Dental  (+) Teeth Intact, Dental Advisory Given   Pulmonary neg pulmonary ROS,    Pulmonary exam normal breath sounds clear to auscultation       Cardiovascular negative cardio ROS Normal cardiovascular exam Rhythm:Regular Rate:Normal     Neuro/Psych PSYCHIATRIC DISORDERS (Paranoid behavior) Bipolar Disorder Schizophrenia negative neurological ROS     GI/Hepatic negative GI ROS, Neg liver ROS,   Endo/Other  Obesity   Renal/GU negative Renal ROS     Musculoskeletal negative musculoskeletal ROS (+)   Abdominal   Peds  Hematology  (+) Blood dyscrasia, anemia ,   Anesthesia Other Findings Day of surgery medications reviewed with the patient.  Reproductive/Obstetrics negative OB ROS                            Anesthesia Physical Anesthesia Plan  ASA: II  Anesthesia Plan: General   Post-op Pain Management:    Induction: Intravenous  PONV Risk Score and Plan: 3 and Dexamethasone, Ondansetron and Midazolam  Airway Management Planned: LMA  Additional Equipment:   Intra-op Plan:   Post-operative Plan: Extubation in OR  Informed Consent: I have reviewed the patients History and Physical, chart, labs and discussed the procedure including the risks, benefits and alternatives for the proposed anesthesia with the patient or authorized representative who has indicated his/her understanding and acceptance.   Dental advisory given  Plan Discussed with: CRNA  Anesthesia Plan Comments: (Risks/benefits of general anesthesia discussed with patient including risk  of damage to teeth, lips, gum, and tongue, nausea/vomiting, allergic reactions to medications, and the possibility of heart attack, stroke and death.  All patient questions answered.  Patient wishes to proceed.)        Anesthesia Quick Evaluation

## 2017-07-17 NOTE — Progress Notes (Signed)
  Radiation Oncology         [phone] ________________________________  Name: Valerie Howard MRN: [id number]  Date: 07/17/2017  DOB: [birth year]  Electron Simulation Note    ICD-10-CM   1. Keloid scar L91.0     Status: outpatient  NARRATIVE: The patient was brought to the treatment unit and placed in the planned treatment position. The clinical setup was verified. The patient then had set up of her custom electron cutout field encompassing the surgical bed along the right upper ear. The patient will be treated with 6MeV electrons. Bolus 1/2 cm will be used to improve the superficial coverage. A custom ear wax was used to limit extension of the electron field into the middle ear and eardrum area. Custom she'll was placed posteriorly behind the right ear to limit dose to the scalp and brain area.  A special port plan is requested for treatment.  PLAN: The patient will receive 3 treatments (4 Gy/fx) for a  cumulative dose of 12 Gy. -----------------------------------  Blair Promise, PhD, MD

## 2017-07-17 NOTE — Interval H&P Note (Signed)
History and Physical Interval Note:  07/17/2017 11:16 AM  Valerie Howard  has presented today for surgery, with the diagnosis of KELOID OF SKIN  The various methods of treatment have been discussed with the patient and family. After consideration of risks, benefits and other options for treatment, the patient has consented to  Procedure(s): EXCISION KELOID OF RIGHT EAR WITH INTROPERATIVE KENALOG INJECTION AND POST OP RADIATION (N/A) KENALOG INJECTION (N/A) as a surgical intervention .  The patient's history has been reviewed, patient examined, no change in status, stable for surgery.  I have reviewed the patient's chart and labs.  Questions were answered to the patient's satisfaction.     Loel Lofty Dillingham

## 2017-07-18 ENCOUNTER — Ambulatory Visit
Admission: RE | Admit: 2017-07-18 | Discharge: 2017-07-18 | Disposition: A | Discharge status: Home / Self Care | Payer: Medicaid Other | Source: Ambulatory Visit | Attending: Radiation Oncology | Admitting: Radiation Oncology

## 2017-07-18 ENCOUNTER — Encounter (HOSPITAL_BASED_OUTPATIENT_CLINIC_OR_DEPARTMENT_OTHER): Payer: Self-pay | Admitting: Plastic Surgery

## 2017-07-18 DIAGNOSIS — L910 Hypertrophic scar: Secondary | ICD-10-CM | POA: Diagnosis not present

## 2017-07-19 ENCOUNTER — Ambulatory Visit
Admission: RE | Admit: 2017-07-19 | Discharge: 2017-07-19 | Disposition: A | Discharge status: Home / Self Care | Payer: Medicaid Other | Source: Ambulatory Visit | Attending: Radiation Oncology | Admitting: Radiation Oncology

## 2017-07-19 DIAGNOSIS — L910 Hypertrophic scar: Secondary | ICD-10-CM | POA: Diagnosis not present

## 2017-07-24 ENCOUNTER — Encounter: Payer: Self-pay | Admitting: Radiation Oncology

## 2017-07-24 ENCOUNTER — Ambulatory Visit
Admission: RE | Admit: 2017-07-24 | Discharge: 2017-07-24 | Disposition: A | Discharge status: Home / Self Care | Payer: Medicaid Other | Source: Ambulatory Visit | Attending: Radiation Oncology | Admitting: Radiation Oncology

## 2017-07-24 DIAGNOSIS — L910 Hypertrophic scar: Secondary | ICD-10-CM

## 2017-07-24 NOTE — Progress Notes (Signed)
  Radiation Oncology         [phone] ________________________________  Name: Valerie Howard MRN: [id number]  Date: 07/24/2017  DOB: 12/02/1975  End of Treatment Note  Diagnosis:   Hypertrophic scar (keloid) of the right ear   Indication for treatment:   postop to reduce chances of recurrence       Radiation treatment dates:   07/18/17-07/24/17  Site/dose:   1. Right ear, operative bed,  12 Gy total delivered in 3 fractions   Beams/energy:   Electron, 6 MeV  Narrative: The patient tolerated radiation treatment relatively well. She did not develop any pain or fatigue. She reported some mild pruritis to the ear, but otherwise she was asymptomatic. No tinnitus or loss of hearing.   Plan: The patient has completed radiation treatment. The patient will return to radiation oncology clinic for routine followup in one month. I advised them to call or return sooner if they have any questions or concerns related to their recovery or treatment.  -----------------------------------  Blair Promise, PhD, MD  This document serves as a record of services personally performed by Gery Pray, MD. It was created on his behalf by Reola Mosher, a trained medical scribe. The creation of this record is based on the scribe's personal observations and the provider's statements to them. This document has been checked and approved by the attending provider.

## 2017-08-30 ENCOUNTER — Encounter: Payer: Self-pay | Admitting: Oncology

## 2017-08-31 ENCOUNTER — Ambulatory Visit: Payer: Medicaid Other | Attending: Radiation Oncology | Admitting: Radiation Oncology

## 2018-09-05 ENCOUNTER — Encounter (HOSPITAL_COMMUNITY): Payer: Self-pay | Admitting: Emergency Medicine

## 2018-09-05 ENCOUNTER — Emergency Department (HOSPITAL_COMMUNITY): Payer: Medicaid Other

## 2018-09-05 ENCOUNTER — Other Ambulatory Visit: Payer: Self-pay

## 2018-09-05 ENCOUNTER — Ambulatory Visit (HOSPITAL_COMMUNITY)
Admission: EM | Admit: 2018-09-05 | Discharge: 2018-09-05 | Disposition: A | Discharge status: Home / Self Care | Payer: Medicaid Other

## 2018-09-05 ENCOUNTER — Emergency Department (HOSPITAL_COMMUNITY)
Admission: EM | Admit: 2018-09-05 | Discharge: 2018-09-05 | Disposition: A | Discharge status: Home / Self Care | Payer: Medicaid Other | Attending: Emergency Medicine | Admitting: Emergency Medicine

## 2018-09-05 DIAGNOSIS — X58XXXA Exposure to other specified factors, initial encounter: Secondary | ICD-10-CM | POA: Diagnosis not present

## 2018-09-05 DIAGNOSIS — Y939 Activity, unspecified: Secondary | ICD-10-CM | POA: Diagnosis not present

## 2018-09-05 DIAGNOSIS — Z23 Encounter for immunization: Secondary | ICD-10-CM | POA: Diagnosis not present

## 2018-09-05 DIAGNOSIS — Y929 Unspecified place or not applicable: Secondary | ICD-10-CM | POA: Diagnosis not present

## 2018-09-05 DIAGNOSIS — Y999 Unspecified external cause status: Secondary | ICD-10-CM | POA: Diagnosis not present

## 2018-09-05 DIAGNOSIS — S61012A Laceration without foreign body of left thumb without damage to nail, initial encounter: Secondary | ICD-10-CM | POA: Insufficient documentation

## 2018-09-05 MED ORDER — LIDOCAINE HCL (PF) 1 % IJ SOLN
10.0000 mL | Freq: Once | INTRAMUSCULAR | Status: AC
  Administered 2018-09-05: 10 mL
  Filled 2018-09-05: qty 10

## 2018-09-05 MED ORDER — TETANUS-DIPHTH-ACELL PERTUSSIS 5-2.5-18.5 LF-MCG/0.5 IM SUSP
0.5000 mL | Freq: Once | INTRAMUSCULAR | Status: AC
  Administered 2018-09-05: 0.5 mL via INTRAMUSCULAR
  Filled 2018-09-05: qty 0.5

## 2018-09-05 NOTE — ED Notes (Signed)
ED Provider at bedside. 

## 2018-09-05 NOTE — Discharge Instructions (Signed)
Keep wound clean and dry.  Your sutures (stitches) are dissolvable. If they do not dissolve in 2 weeks, you can go to your primary care doctor, an urgent care or return to ER to have them removed.   Monitor for signs of infection such as redness, swelling, drainage from the wound or fever. Should any of these symptoms occur, you should return to the ER. You may also return to ER for any additional concerns.

## 2018-09-05 NOTE — ED Notes (Addendum)
Pt thumb soaking in NS and betadine

## 2018-09-05 NOTE — ED Triage Notes (Signed)
Pt here for laceration to L thumb, significant laceration noted to L thumb. Dr. Mannie Stabile at bedside. Speaking with patient.

## 2018-09-05 NOTE — ED Notes (Signed)
This tech went to waiting room and called "last name, [name]" Pt. Was sitting in triage and turned around quickly loudly stating "My name is Valerie Howard is my room ready". This tech told her "Yes, please follow me." Was bringing pt. To [facility], Pt yelling "No, no, no, you are NOT going to put me in 9. That's a bad number. Yall not going to kill me today". This tech escorted the pt back to the waiting room and asked her to wait until another room became available.

## 2018-09-05 NOTE — ED Triage Notes (Signed)
Pt. Stated, I was cutting my braids and I cut my left thumb Pt. Has a laceration on posterior of left thumb.

## 2018-09-05 NOTE — ED Notes (Signed)
Patient verbalizes understanding of discharge instructions. Opportunity for questioning and answers were provided. Armband removed by staff, pt discharged from ED ambulatory.   

## 2018-09-05 NOTE — ED Triage Notes (Signed)
This injury happen just after midnight.

## 2018-09-05 NOTE — ED Provider Notes (Signed)
[place] EMERGENCY DEPARTMENT Provider Note   CSN: [id number] Arrival date & time: 09/05/18  1836     History   Chief Complaint Chief Complaint  Patient presents with  . Laceration  . Hand Injury    HPI Valerie Howard is a 43 y.o. female.  The history is provided by the patient and medical records. No language interpreter was used.  Laceration  Hand Injury   Valerie Howard is a 43 y.o. female  with a PMH of schizophrenia who presents to the Emergency Department complaining of laceration to the left hand.  Patient and daughter at bedside states that this was accidental.  She was trying to cut her braids and accidentally cut her hand with shears.  She denies any purposeful self injury.  No medications taken prior to arrival for symptoms.  She went to urgent care who prompted her to come to the emergency department for further evaluation.  No numbness, tingling or weakness. Unsure of tetanus status.  Patient appears to be manic.  Keeps telling me about the devices in her head that need to be removed to make her stop twitching.  She is not twitching.  She has a daughter with her.  Unfortunately, these hallucinations appear to be of chronic nature.  She denies any suicidal or homicidal thoughts.  Daughter does not feel as if she is a threat to herself or others currently.  Past Medical History:  Diagnosis Date  . History of radiation therapy 07/18/17-07/24/17   right ear, operative bed 12 Gy in 3 fractions  . Keloid    right ear  . Paranoid behavior ([place])   . Schizophrenia [facility])     Patient Active Problem List   Diagnosis Date Noted  . 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 Schizoaffective disorder, bipolar type ([place])   . Suicidal ideation   . Menorrhagia 05/12/2014  . Personality disorder ([place]) 10/17/2012  . Anemia, iron deficiency 10/13/2012  . Tachycardia 10/01/2012    Past Surgical History:  Procedure Laterality Date  . CESAREAN SECTION    . KENALOG  INJECTION Bilateral 07/17/2017   Procedure: KENALOG INJECTION;  Surgeon: Wallace Going, DO;  Location: [place];  Service: Plastics;  Laterality: Bilateral;  . MASS EXCISION N/A 07/17/2017   Procedure: EXCISION KELOID OF RIGHT EAR WITH INTROPERATIVE KENALOG INJECTION AND POST OP RADIATION;  Surgeon: Wallace Going, DO;  Location: [place];  Service: Plastics;  Laterality: N/A;  . TUBAL LIGATION       OB History   No obstetric history on file.      Home Medications    Prior to Admission medications   Medication Sig Start Date End Date Taking? Authorizing Provider  benztropine (COGENTIN) 0.5 MG tablet Take 1 tablet (0.5 mg total) by mouth 2 (two) times daily. For prevention of drug induced tremors Patient not taking: Reported on 07/12/2017 03/21/17   Lindell Spar I, NP  divalproex (DEPAKOTE ER) 500 MG 24 hr tablet Take 2 tablets (1,000 mg total) by mouth at bedtime. For mood stabilization Patient not taking: Reported on 07/12/2017 03/21/17   Lindell Spar I, NP  docusate sodium (COLACE) 100 MG capsule Take 1 capsule (100 mg total) by mouth 2 (two) times daily. (May buy from over the counter at the pharmacy): For constipation Patient not taking: Reported on 07/12/2017 03/21/17   Lindell Spar I, NP  ferrous sulfate 325 (65 FE) MG tablet Take 1 tablet (325 mg total) by mouth  3 (three) times daily with meals. For low iron Patient not taking: Reported on 07/12/2017 03/21/17   Lindell Spar I, NP  gabapentin (NEURONTIN) 300 MG capsule Take 1 capsule (300 mg total) by mouth 2 (two) times daily. For agitation Patient not taking: Reported on 07/12/2017 03/21/17   Lindell Spar I, NP  haloperidol (HALDOL) 10 MG tablet Take 1 tablet (10 mg total) by mouth 2 (two) times daily. For mood control Patient not taking: Reported on 07/12/2017 03/21/17   Lindell Spar I, NP  haloperidol decanoate (HALDOL DECANOATE) 100 MG/ML injection (Due on 04-18-17) Haloperidol  Decanoate 100 mg/ML IM solution every 28 days: For mood control Patient not taking: Reported on 07/12/2017 04/18/17   Lindell Spar I, NP  lisinopril (PRINIVIL,ZESTRIL) 10 MG tablet Take 1 tablet (10 mg total) by mouth daily. For high blood pressure Patient not taking: Reported on 07/12/2017 03/22/17   Lindell Spar I, NP  nicotine (NICODERM CQ - DOSED IN MG/24 HOURS) 21 mg/24hr patch Place 1 patch (21 mg total) onto the skin daily. For smoking cessation Patient not taking: Reported on 07/12/2017 03/22/17   Encarnacion Slates, NP    Family History Family History  Problem Relation Age of Onset  . Hypertension Other   . Diabetes Other   . Cancer Other   . Keloids Mother   . Breast cancer Maternal Grandmother     Social History Social History   Tobacco Use  . Smoking status: Never Smoker  . Smokeless tobacco: Never Used  Substance Use Topics  . Alcohol use: Yes    Comment: socially  . Drug use: No     Allergies   Penicillins   Review of Systems Review of Systems  Musculoskeletal: Positive for myalgias.  Skin: Positive for wound.  Neurological: Negative for weakness and numbness.  Psychiatric/Behavioral: Positive for hallucinations. Negative for self-injury and suicidal ideas.  All other systems reviewed and are negative.    Physical Exam Updated Vital Signs BP (!) 132/98 (BP Location: Right Arm)   Pulse 99   Temp 98.4 F (36.9 C) (Oral)   Resp 16   Ht 5\' 7"  (1.702 m)   Wt 79.4 kg   LMP 08/03/2018   SpO2 99%   BMI 27.41 kg/m   Physical Exam Vitals signs and nursing note reviewed.  Constitutional:      General: She is not in acute distress.    Appearance: She is well-developed.  HENT:     Head: Normocephalic and atraumatic.  Neck:     Musculoskeletal: Neck supple.  Cardiovascular:     Rate and Rhythm: Normal rate and regular rhythm.     Heart sounds: Normal heart sounds. No murmur.  Pulmonary:     Effort: Pulmonary effort is normal. No respiratory distress.       Breath sounds: Normal breath sounds. No wheezing or rales.  Musculoskeletal: Normal range of motion.  Skin:    General: Skin is warm and dry.     Comments: 4 cm laceration to the left thumb.  Full range of motion.  Good cap refill.  Sensation intact.  Neurological:     Mental Status: She is alert.      ED Treatments / Results  Labs (all labs ordered are listed, but only abnormal results are displayed) Labs Reviewed - No data to display  EKG None  Radiology Dg Hand Complete Left  Result Date: 09/05/2018 CLINICAL DATA:  Laceration to the left thumb this morning EXAM: LEFT HAND - COMPLETE 3+  VIEW COMPARISON:  None. FINDINGS: No fracture or dislocation. No suspicious focal osseous lesion. No significant arthropathy. No radiopaque foreign body. No osseous erosions or periosteal reaction. IMPRESSION: No fracture, malalignment or radiopaque foreign body. Electronically Signed   By: Ilona Sorrel M.D.   On: 09/05/2018 20:58    Procedures .Marland KitchenLaceration Repair Date/Time: 09/05/2018 10:34 PM Performed by: Ward, Ozella Almond, PA-C Authorized by: Ward, Ozella Almond, PA-C   Consent:    Consent obtained:  Verbal   Consent given by:  Patient   Risks discussed:  Pain, infection, poor cosmetic result and poor wound healing Anesthesia (see MAR for exact dosages):    Anesthesia method:  Local infiltration   Local anesthetic:  Lidocaine 1% w/o epi Laceration details:    Location:  Finger   Finger location:  L thumb   Length (cm):  4 Repair type:    Repair type:  Simple Pre-procedure details:    Preparation:  Patient was prepped and draped in usual sterile fashion and imaging obtained to evaluate for foreign bodies Exploration:    Hemostasis achieved with:  Direct pressure   Wound exploration: wound explored through full range of motion and entire depth of wound probed and visualized     Wound extent: no foreign bodies/material noted   Treatment:    Area cleansed with:  Saline    Amount of cleaning:  Standard   Irrigation solution:  Sterile saline Skin repair:    Repair method:  Sutures   Suture size:  5-0   Wound skin closure material used: Vicryl Rapide.   Suture technique:  Simple interrupted   Number of sutures:  8 Approximation:    Approximation:  Close Post-procedure details:    Dressing:  Open (no dressing)   Patient tolerance of procedure:  Tolerated well, no immediate complications   (including critical care time)  Medications Ordered in ED Medications  Tdap (BOOSTRIX) injection 0.5 mL (0.5 mLs Intramuscular Given 09/05/18 2101)  lidocaine (PF) (XYLOCAINE) 1 % injection 10 mL (10 mLs Infiltration Given by Other 09/05/18 2100)     Initial Impression / Assessment and Plan / ED Course  I have reviewed the triage vital signs and the nursing notes.  Pertinent labs & imaging results that were available during my care of the patient were reviewed by me and considered in my medical decision making (see chart for details).    SYRIAH DELISI is a 43 y.o. female who presents to ED for laceration of her left thumb which occurred just prior to arrival.  Tetanus updated.  Neurovascularly intact on exam.  X-ray with no acute abnormalities.  Patient does appear to be having some hallucinations.  She reports that there are devices in her head which are causing her face to twitch that need to be removed.  She seems fixated on this.  She does have history of schizophrenia.  She and her daughter both deny that injury today was intentional.  They both report that she was trying to cut the braids out of her hair and accidentally cut her hand.  She denies any suicidal or homicidal ideations.  Daughter does not believe that she is a threat to herself or others.  She unfortunately has history of hallucinations such as this.  After laceration was repaired, she was immediately ready to leave.  She does not want to stay for further psychiatric care.  She does not appear to be a threat  to herself or others.  She was discharged home with recommendations  to follow-up with her psychiatrist or [facility].  Daughter is comfortable with this plan.  Patient discussed with Dr. Tomi Bamberger who agrees with treatment plan.    Final Clinical Impressions(s) / ED Diagnoses   Final diagnoses:  Laceration of left thumb without foreign body without damage to nail, initial encounter    ED Discharge Orders    None       Ward, Ozella Almond, PA-C 09/05/18 2237    Dorie Rank, MD 09/06/18 1530

## 2019-02-27 ENCOUNTER — Encounter (HOSPITAL_COMMUNITY): Payer: Self-pay

## 2019-02-27 ENCOUNTER — Other Ambulatory Visit: Payer: Self-pay

## 2019-02-27 ENCOUNTER — Inpatient Hospital Stay (HOSPITAL_COMMUNITY)
Admission: RE | Admit: 2019-02-27 | Discharge: 2019-02-28 | DRG: 885 | Disposition: A | Discharge status: Home / Self Care | Payer: Medicaid Other | Attending: Internal Medicine | Admitting: Internal Medicine

## 2019-02-27 DIAGNOSIS — N920 Excessive and frequent menstruation with regular cycle: Secondary | ICD-10-CM | POA: Diagnosis present

## 2019-02-27 DIAGNOSIS — F250 Schizoaffective disorder, bipolar type: Secondary | ICD-10-CM | POA: Diagnosis not present

## 2019-02-27 DIAGNOSIS — D649 Anemia, unspecified: Secondary | ICD-10-CM | POA: Diagnosis present

## 2019-02-27 DIAGNOSIS — R4689 Other symptoms and signs involving appearance and behavior: Secondary | ICD-10-CM

## 2019-02-27 DIAGNOSIS — D509 Iron deficiency anemia, unspecified: Secondary | ICD-10-CM | POA: Diagnosis not present

## 2019-02-27 DIAGNOSIS — Z880 Allergy status to penicillin: Secondary | ICD-10-CM | POA: Diagnosis not present

## 2019-02-27 DIAGNOSIS — Z803 Family history of malignant neoplasm of breast: Secondary | ICD-10-CM | POA: Diagnosis not present

## 2019-02-27 DIAGNOSIS — F259 Schizoaffective disorder, unspecified: Secondary | ICD-10-CM | POA: Diagnosis present

## 2019-02-27 DIAGNOSIS — I959 Hypotension, unspecified: Secondary | ICD-10-CM | POA: Diagnosis not present

## 2019-02-27 DIAGNOSIS — F329 Major depressive disorder, single episode, unspecified: Secondary | ICD-10-CM | POA: Diagnosis present

## 2019-02-27 DIAGNOSIS — Z9114 Patient's other noncompliance with medication regimen: Secondary | ICD-10-CM | POA: Diagnosis not present

## 2019-02-27 DIAGNOSIS — D500 Iron deficiency anemia secondary to blood loss (chronic): Secondary | ICD-10-CM | POA: Diagnosis not present

## 2019-02-27 DIAGNOSIS — D62 Acute posthemorrhagic anemia: Secondary | ICD-10-CM | POA: Diagnosis not present

## 2019-02-27 DIAGNOSIS — Z923 Personal history of irradiation: Secondary | ICD-10-CM | POA: Diagnosis not present

## 2019-02-27 DIAGNOSIS — Z5329 Procedure and treatment not carried out because of patient's decision for other reasons: Secondary | ICD-10-CM | POA: Diagnosis not present

## 2019-02-27 DIAGNOSIS — Z1159 Encounter for screening for other viral diseases: Secondary | ICD-10-CM | POA: Diagnosis not present

## 2019-02-27 DIAGNOSIS — N921 Excessive and frequent menstruation with irregular cycle: Secondary | ICD-10-CM | POA: Diagnosis not present

## 2019-02-27 DIAGNOSIS — Z751 Person awaiting admission to adequate facility elsewhere: Secondary | ICD-10-CM | POA: Diagnosis not present

## 2019-02-27 DIAGNOSIS — N939 Abnormal uterine and vaginal bleeding, unspecified: Secondary | ICD-10-CM | POA: Diagnosis not present

## 2019-02-27 LAB — COMPREHENSIVE METABOLIC PANEL
ALT: 9 U/L (ref 0–44)
AST: 15 U/L (ref 15–41)
Albumin: 4.6 g/dL (ref 3.5–5.0)
Alkaline Phosphatase: 49 U/L (ref 38–126)
Anion gap: 10 (ref 5–15)
BUN: 19 mg/dL (ref 6–20)
CO2: 24 mmol/L (ref 22–32)
Calcium: 9.7 mg/dL (ref 8.9–10.3)
Chloride: 103 mmol/L (ref 98–111)
Creatinine, Ser: 0.93 mg/dL (ref 0.44–1.00)
GFR calc Af Amer: >60 mL/min (ref >60)
GFR calc non Af Amer: >60 mL/min (ref >60)
Glucose, Bld: 93 mg/dL (ref 70–99)
Potassium: 3.7 mmol/L (ref 3.5–5.1)
Sodium: 137 mmol/L (ref 135–145)
Total Bilirubin: 0.6 mg/dL (ref 0.3–1.2)
Total Protein: 8.5 g/dL — ABNORMAL HIGH (ref 6.5–8.1)

## 2019-02-27 LAB — CBC
HCT: 21.2 % — ABNORMAL LOW (ref 36.0–46.0)
Hemoglobin: 5.2 g/dL — CL (ref 12.0–15.0)
MCH: 15.6 pg — ABNORMAL LOW (ref 26.0–34.0)
MCHC: 24.5 g/dL — ABNORMAL LOW (ref 30.0–36.0)
MCV: 63.5 fL — ABNORMAL LOW (ref 80.0–100.0)
Platelets: 303 10*3/uL (ref 150–400)
RBC: 3.34 MIL/uL — ABNORMAL LOW (ref 3.87–5.11)
RDW: 26 % — ABNORMAL HIGH (ref 11.5–15.5)
WBC: 8 10*3/uL (ref 4.0–10.5)
nRBC: 0 % (ref 0.0–0.2)

## 2019-02-27 LAB — HEMOGLOBIN A1C
Hgb A1c MFr Bld: 4.5 % — ABNORMAL LOW (ref 4.8–5.6)
Mean Plasma Glucose: 82.45 mg/dL

## 2019-02-27 LAB — TSH: TSH: 2.384 u[IU]/mL (ref 0.350–4.500)

## 2019-02-27 LAB — IRON AND TIBC
Iron: 12 ug/dL — ABNORMAL LOW (ref 28–170)
Saturation Ratios: 3 % — ABNORMAL LOW (ref 10.4–31.8)
TIBC: 472 ug/dL — ABNORMAL HIGH (ref 250–450)
UIBC: 460 ug/dL

## 2019-02-27 LAB — PREPARE RBC (CROSSMATCH)

## 2019-02-27 LAB — HCG, QUANTITATIVE, PREGNANCY: hCG, Beta Chain, Quant, S: <1 m[IU]/mL (ref <5)

## 2019-02-27 LAB — LIPID PANEL
Cholesterol: 171 mg/dL (ref 0–200)
HDL: 43 mg/dL (ref >40)
LDL Cholesterol: 112 mg/dL — ABNORMAL HIGH (ref 0–99)
Total CHOL/HDL Ratio: 4 RATIO
Triglycerides: 81 mg/dL (ref <150)
VLDL: 16 mg/dL (ref 0–40)

## 2019-02-27 LAB — ETHANOL: Alcohol, Ethyl (B): <10 mg/dL (ref <10)

## 2019-02-27 LAB — FERRITIN: Ferritin: 2 ng/mL — ABNORMAL LOW (ref 11–307)

## 2019-02-27 LAB — SARS CORONAVIRUS 2 BY RT PCR (HOSPITAL ORDER, PERFORMED IN ~~LOC~~ HOSPITAL LAB): SARS Coronavirus 2: NEGATIVE

## 2019-02-27 MED ORDER — ALUM & MAG HYDROXIDE-SIMETH 200-200-20 MG/5ML PO SUSP: 30.0000 mL | ORAL | Status: DC | PRN

## 2019-02-27 MED ORDER — SODIUM CHLORIDE 0.9 % IV SOLN: 10.0000 mL/h | Freq: Once | INTRAVENOUS | Status: DC

## 2019-02-27 MED ORDER — LORAZEPAM 1 MG PO TABS: 2.0000 mg | ORAL_TABLET | Freq: Four times a day (QID) | ORAL | Status: DC | PRN

## 2019-02-27 MED ORDER — DIPHENHYDRAMINE HCL 25 MG PO CAPS: 50.0000 mg | ORAL_CAPSULE | Freq: Four times a day (QID) | ORAL | Status: DC | PRN

## 2019-02-27 MED ORDER — HALOPERIDOL LACTATE 5 MG/ML IJ SOLN: 5.0000 mg | Freq: Four times a day (QID) | INTRAMUSCULAR | Status: DC | PRN

## 2019-02-27 MED ORDER — LORAZEPAM 2 MG/ML IJ SOLN: 2.0000 mg | Freq: Four times a day (QID) | INTRAMUSCULAR | Status: DC | PRN

## 2019-02-27 MED ORDER — LORAZEPAM 2 MG/ML IJ SOLN
INTRAMUSCULAR | Status: AC
  Administered 2019-02-27: 2 mg
  Filled 2019-02-27: qty 1

## 2019-02-27 MED ORDER — DIPHENHYDRAMINE HCL 50 MG/ML IJ SOLN
50.0000 mg | Freq: Four times a day (QID) | INTRAMUSCULAR | Status: DC | PRN
  Administered 2019-02-27: 50 mg via INTRAMUSCULAR

## 2019-02-27 MED ORDER — ACETAMINOPHEN 325 MG PO TABS: 650.0000 mg | ORAL_TABLET | Freq: Four times a day (QID) | ORAL | Status: DC | PRN

## 2019-02-27 MED ORDER — HALOPERIDOL LACTATE 5 MG/ML IJ SOLN
INTRAMUSCULAR | Status: AC
  Administered 2019-02-27: 5 mg
  Filled 2019-02-27: qty 1
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
## 2019-02-27 NOTE — ED Notes (Signed)
Pt resting at this time, pt unable to provide urine specimen at this time. [facility] sitter at bedside.
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
## 2019-02-27 NOTE — ED Notes (Signed)
Bed: JK82 Expected date:  Expected time:  Means of arrival:  Comments: From [facility] - 5
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
## 2019-02-27 NOTE — ED Triage Notes (Addendum)
Arrive by [facility] from [facility] with hemoglobin of 5.2. Asymptomatic
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
## 2019-02-27 NOTE — Progress Notes (Signed)
Progress note  Lab called and stated the pt had a critical lab value of 5.2 hemoglobin. MD was contacted and pt was transferred via EMS non emergent to [facility]. Pt was asymptomatic. Pt denies any physical complaints besides wanting to leave.

## 2019-02-28 ENCOUNTER — Inpatient Hospital Stay (HOSPITAL_COMMUNITY)
Admission: AD | Admit: 2019-02-28 | Discharge: 2019-03-06 | DRG: 812 | Disposition: A | Discharge status: Other Acute Inpatient Hospital | Payer: Medicaid Other | Source: Other Acute Inpatient Hospital | Attending: Internal Medicine | Admitting: Internal Medicine

## 2019-02-28 ENCOUNTER — Encounter (HOSPITAL_COMMUNITY): Payer: Self-pay | Admitting: Internal Medicine

## 2019-02-28 DIAGNOSIS — Z5329 Procedure and treatment not carried out because of patient's decision for other reasons: Secondary | ICD-10-CM | POA: Diagnosis present

## 2019-02-28 DIAGNOSIS — Z751 Person awaiting admission to adequate facility elsewhere: Secondary | ICD-10-CM

## 2019-02-28 DIAGNOSIS — D509 Iron deficiency anemia, unspecified: Secondary | ICD-10-CM | POA: Diagnosis present

## 2019-02-28 DIAGNOSIS — N920 Excessive and frequent menstruation with regular cycle: Secondary | ICD-10-CM | POA: Diagnosis present

## 2019-02-28 DIAGNOSIS — F250 Schizoaffective disorder, bipolar type: Secondary | ICD-10-CM | POA: Diagnosis present

## 2019-02-28 DIAGNOSIS — I959 Hypotension, unspecified: Secondary | ICD-10-CM | POA: Diagnosis present

## 2019-02-28 DIAGNOSIS — N921 Excessive and frequent menstruation with irregular cycle: Secondary | ICD-10-CM | POA: Diagnosis not present

## 2019-02-28 DIAGNOSIS — D500 Iron deficiency anemia secondary to blood loss (chronic): Secondary | ICD-10-CM

## 2019-02-28 DIAGNOSIS — N939 Abnormal uterine and vaginal bleeding, unspecified: Secondary | ICD-10-CM | POA: Diagnosis not present

## 2019-02-28 DIAGNOSIS — Z880 Allergy status to penicillin: Secondary | ICD-10-CM | POA: Diagnosis not present

## 2019-02-28 DIAGNOSIS — D62 Acute posthemorrhagic anemia: Secondary | ICD-10-CM | POA: Diagnosis present

## 2019-02-28 DIAGNOSIS — F22 Delusional disorders: Secondary | ICD-10-CM

## 2019-02-28 DIAGNOSIS — D649 Anemia, unspecified: Secondary | ICD-10-CM | POA: Diagnosis present

## 2019-02-28 DIAGNOSIS — Z9114 Patient's other noncompliance with medication regimen: Secondary | ICD-10-CM

## 2019-02-28 DIAGNOSIS — F209 Schizophrenia, unspecified: Secondary | ICD-10-CM | POA: Diagnosis not present

## 2019-02-28 LAB — CBC
HCT: 26.3 % — ABNORMAL LOW (ref 36.0–46.0)
Hemoglobin: 7.3 g/dL — ABNORMAL LOW (ref 12.0–15.0)
MCH: 19.9 pg — ABNORMAL LOW (ref 26.0–34.0)
MCHC: 27.8 g/dL — ABNORMAL LOW (ref 30.0–36.0)
MCV: 71.7 fL — ABNORMAL LOW (ref 80.0–100.0)
Platelets: 236 10*3/uL (ref 150–400)
RBC: 3.67 MIL/uL — ABNORMAL LOW (ref 3.87–5.11)
RDW: 29.1 % — ABNORMAL HIGH (ref 11.5–15.5)
WBC: 4.9 10*3/uL (ref 4.0–10.5)
nRBC: 0 % (ref 0.0–0.2)

## 2019-02-28 LAB — BASIC METABOLIC PANEL
Anion gap: 7 (ref 5–15)
BUN: 17 mg/dL (ref 6–20)
CO2: 22 mmol/L (ref 22–32)
Calcium: 9.1 mg/dL (ref 8.9–10.3)
Chloride: 108 mmol/L (ref 98–111)
Creatinine, Ser: 0.82 mg/dL (ref 0.44–1.00)
GFR calc Af Amer: >60 mL/min (ref >60)
GFR calc non Af Amer: >60 mL/min (ref >60)
Glucose, Bld: 94 mg/dL (ref 70–99)
Potassium: 4 mmol/L (ref 3.5–5.1)
Sodium: 137 mmol/L (ref 135–145)

## 2019-02-28 LAB — HEPATIC FUNCTION PANEL
ALT: 8 U/L (ref 0–44)
AST: 12 U/L — ABNORMAL LOW (ref 15–41)
Albumin: 3.9 g/dL (ref 3.5–5.0)
Alkaline Phosphatase: 48 U/L (ref 38–126)
Bilirubin, Direct: 0.1 mg/dL (ref 0.0–0.2)
Indirect Bilirubin: 0.6 mg/dL (ref 0.3–0.9)
Total Bilirubin: 0.7 mg/dL (ref 0.3–1.2)
Total Protein: 7.4 g/dL (ref 6.5–8.1)

## 2019-02-28 LAB — HEMOGLOBIN AND HEMATOCRIT, BLOOD
HCT: 25.2 % — ABNORMAL LOW (ref 36.0–46.0)
Hemoglobin: 7.2 g/dL — ABNORMAL LOW (ref 12.0–15.0)

## 2019-02-28 LAB — PREPARE RBC (CROSSMATCH)

## 2019-02-28 LAB — ABO/RH: ABO/RH(D): O POS

## 2019-02-28 LAB — PROLACTIN: Prolactin: 72.6 ng/mL — ABNORMAL HIGH (ref 4.8–23.3)

## 2019-02-28 MED ORDER — ACETAMINOPHEN 650 MG RE SUPP: 650.0000 mg | Freq: Four times a day (QID) | RECTAL | Status: DC | PRN

## 2019-02-28 MED ORDER — LORAZEPAM 2 MG/ML IJ SOLN: 1.0000 mg | Freq: Four times a day (QID) | INTRAMUSCULAR | Status: DC | PRN

## 2019-02-28 MED ORDER — SODIUM CHLORIDE 0.9% IV SOLUTION: Freq: Once | INTRAVENOUS | Status: AC

## 2019-02-28 MED ORDER — ONDANSETRON HCL 4 MG/2ML IJ SOLN: 4.0000 mg | Freq: Four times a day (QID) | INTRAMUSCULAR | Status: DC | PRN

## 2019-02-28 MED ORDER — SODIUM CHLORIDE 0.9 % IV SOLN
510.0000 mg | Freq: Once | INTRAVENOUS | Status: AC
  Administered 2019-02-28: 510 mg via INTRAVENOUS
  Filled 2019-02-28: qty 17

## 2019-02-28 MED ORDER — SODIUM CHLORIDE 0.9% IV SOLUTION
Freq: Once | INTRAVENOUS | Status: AC
  Administered 2019-02-28: 03:00:00 via INTRAVENOUS

## 2019-02-28 MED ORDER — ONDANSETRON HCL 4 MG PO TABS: 4.0000 mg | ORAL_TABLET | Freq: Four times a day (QID) | ORAL | Status: DC | PRN

## 2019-02-28 MED ORDER — HALOPERIDOL 5 MG PO TABS
5.0000 mg | ORAL_TABLET | Freq: Four times a day (QID) | ORAL | Status: DC | PRN
  Filled 2019-02-28: qty 1

## 2019-02-28 MED ORDER — HALOPERIDOL LACTATE 5 MG/ML IJ SOLN: 2.0000 mg | Freq: Four times a day (QID) | INTRAMUSCULAR | Status: DC | PRN

## 2019-02-28 MED ORDER — ACETAMINOPHEN 325 MG PO TABS: 650.0000 mg | ORAL_TABLET | Freq: Four times a day (QID) | ORAL | Status: DC | PRN

## 2019-02-28 NOTE — Progress Notes (Signed)
Patient refused to sign Blood consent. I attempted and charge nurse. House supervisor also attempted to get patient to sign. Patient only gave verbal consent for Blood. Verbal consent was witnessed by two nurses and placed on chart.

## 2019-02-28 NOTE — Plan of Care (Signed)
Sitter at bedside. Gait is steady

## 2019-02-28 NOTE — ED Notes (Signed)
ED TO INPATIENT HANDOFF REPORT  Name/Age/Gender Valerie Howard 43 y.o. female  Code Status    Code Status Orders  (From admission, onward)         Start     Ordered   02/27/19 1440  Full code  Continuous     02/27/19 1439        Code Status History    Date Active Date Inactive Code Status Order ID Comments User Context   03/08/2017 1443 03/21/2017 1744 Full Code [id number]  Patrecia Pour, NP Inpatient   03/07/2017 1749 03/08/2017 1442 Full Code [id number]  Jeannett Senior, PA-C ED   03/25/2016 2238 03/30/2016 1826 Full Code [id number]  [name], PA-C ED   08/26/2015 2315 09/04/2015 1554 Full Code [id number]  Macarthur Critchley, MD ED   07/05/2015 2124 07/18/2015 1657 Full Code [id number]  [name], PA-C ED   09/23/2012 0430 09/26/2012 0055 Full Code [id number]  Linus Mako, [place] ED   09/06/2012 0902 09/09/2012 1936 Full Code [id number]  Minerva Fester, RN ED   12/06/2011 1740 12/20/2011 2052 Full Code [id number]  Iantha Fallen, RN Inpatient   12/06/2011 0201 12/06/2011 1740 Full Code [id number]  Abran Richard, PA-C ED   Advance Care Planning Activity      Home/SNF/Other [facility]  Chief Complaint Schizo-affective Disorder Bipolar Type  Level of Care/Admitting Diagnosis ED Disposition    ED Disposition Condition Comment   Admit  Hospital Area: [facility] [[id number]] Level of Care: Stepdown [14] Admit to SDU based on following criteria: Severe physiological/psychological symptoms:  Any diagnosis requiring assessment & intervention at least every 4 hours on  an ongoing basis to obtain desired patient outcomes including stability and rehabilitation Covid Evaluation: Screening Protocol (No Symptoms) Diagnosis: Acute blood loss anemia [371696] Admitting Physician: Rise Patience [phone] Attending P hysician: Rise Patience [[id number]] PT Class (Do Not Modify): Observation [104] PT Acc Code (Do Not Modify): Observation [10022]       Medical  History Past Medical History:  Diagnosis Date  . History of radiation therapy 07/18/17-07/24/17   right ear, operative bed 12 Gy in 3 fractions  . Keloid    right ear  . Paranoid behavior ([place])   . Schizophrenia ([place])     Allergies Allergies  Allergen Reactions  . Penicillins Rash    Has patient had a PCN reaction causing immediate rash, facial/tongue/throat swelling, SOB or lightheadedness with hypotension:Patient refuses to answer (PRA) Has patient had a PCN reaction causing severe rash involving mucus membranes or skin necrosis:PRA Has patient had a PCN reaction that required hospitalization PRA Has patient had a PCN reaction occurring within the last 10 years:PRA If all of the above answers are "NO", then may proceed with Cephalosporin use.     IV Location/Drains/Wounds Patient Lines/Drains/Airways Status   Active Line/Drains/Airways    Name:   Placement date:   Placement time:   Site:   Days:   Peripheral IV 02/27/19 Right;Upper Arm   02/27/19    2112    Arm   1   Incision (Closed) 07/17/17 Ear Right   07/17/17    1227     591          Labs/Imaging Results for orders placed or performed during the hospital encounter of 02/27/19 (from the past 48 hour(s))  SARS Coronavirus 2 (CEPHEID - Performed in [place] hospital lab), Hosp Order     Status: None   Collection Time: 02/27/19  3:47  PM   Specimen: Nasopharyngeal Swab  Result Value Ref Range   SARS Coronavirus 2 NEGATIVE NEGATIVE    Comment: (NOTE) If result is NEGATIVE SARS-CoV-2 target nucleic acids are NOT DETECTED. The SARS-CoV-2 RNA is generally detectable in upper and lower  respiratory specimens during the acute phase of infection. The lowest  concentration of SARS-CoV-2 viral copies this assay can detect is 250  copies / mL. A negative result does not preclude SARS-CoV-2 infection  and should not be used as the sole basis for treatment or other  patient management decisions.  A negative result may occur  with  improper specimen collection / handling, submission of specimen other  than nasopharyngeal swab, presence of viral mutation(s) within the  areas targeted by this assay, and inadequate number of viral copies  (<250 copies / mL). A negative result must be combined with clinical  observations, patient history, and epidemiological information. If result is POSITIVE SARS-CoV-2 target nucleic acids are DETECTED. The SARS-CoV-2 RNA is generally detectable in upper and lower  respiratory specimens dur ing the acute phase of infection.  Positive  results are indicative of active infection with SARS-CoV-2.  Clinical  correlation with patient history and other diagnostic information is  necessary to determine patient infection status.  Positive results do  not rule out bacterial infection or co-infection with other viruses. If result is PRESUMPTIVE POSTIVE SARS-CoV-2 nucleic acids MAY BE PRESENT.   A presumptive positive result was obtained on the submitted specimen  and confirmed on repeat testing.  While 2019 novel coronavirus  (SARS-CoV-2) nucleic acids may be present in the submitted sample  additional confirmatory testing may be necessary for epidemiological  and / or clinical management purposes  to differentiate between  SARS-CoV-2 and other Sarbecovirus currently known to infect humans.  If clinically indicated additional testing with an alternate test  methodology [phone]) is advised. The SARS-CoV-2 RNA is generally  detectable in upper and lower respiratory sp ecimens during the acute  phase of infection. The expected result is Negative. Fact Sheet for Patients:  StrictlyIdeas.no Fact Sheet for Healthcare Providers: BankingDealers.co.za This test is not yet approved or cleared by the [place] FDA and has been authorized for detection and/or diagnosis of SARS-CoV-2 by FDA under an Emergency Use Authorization (EUA).  This EUA  will remain in effect (meaning this test can be used) for the duration of the COVID-19 declaration under Section 564(b)(1) of the Act, 21 U.S.C. section 360bbb-3(b)(1), unless the authorization is terminated or revoked sooner. Performed at [facility], [place] [address], [place], [place] [address]   CBC     Status: Abnormal   Collection Time: 02/27/19  6:21 PM  Result Value Ref Range   WBC 8.0 4.0 - 10.5 K/uL   RBC 3.34 (L) 3.87 - 5.11 MIL/uL   Hemoglobin 5.2 (LL) 12.0 - 15.0 g/dL    Comment: REPEATED TO VERIFY Reticulocyte Hemoglobin testing may be clinically indicated, consider ordering this additional test [id number] THIS CRITICAL RESULT HAS VERIFIED AND BEEN CALLED TO [facility],M BY KENNEDY JACKSON ON 07 01 2020 AT 1939, AND HAS BEEN READ BACK. CRITICAL RESULT VERIFIED    HCT 21.2 (L) 36.0 - 46.0 %   MCV 63.5 (L) 80.0 - 100.0 fL   MCH 15.6 (L) 26.0 - 34.0 pg   MCHC 24.5 (L) 30.0 - 36.0 g/dL   RDW 26.0 (H) 11.5 - 15.5 %   Platelets 303 150 - 400 K/uL    Comment: REPEATED TO VERIFY PLATELET  COUNT CONFIRMED BY SMEAR SPECIMEN CHECKED FOR CLOTS    nRBC 0.0 0.0 - 0.2 %    Comment: Performed at [facility], [place] [address] [place], [place] [address]  Comprehensive metabolic panel     Status: Abnormal   Collection Time: 02/27/19  6:21 PM  Result Value Ref Range   Sodium 137 135 - 145 mmol/L   Potassium 3.7 3.5 - 5.1 mmol/L   Chloride 103 98 - 111 mmol/L   CO2 24 22 - 32 mmol/L   Glucose, Bld 93 70 - 99 mg/dL   BUN 19 6 - 20 mg/dL   Creatinine, Ser 0.93 0.44 - 1.00 mg/dL   Calcium 9.7 8.9 - 10.3 mg/dL   Total Protein 8.5 (H) 6.5 - 8.1 g/dL   Albumin 4.6 3.5 - 5.0 g/dL   AST 15 15 - 41 U/L   ALT 9 0 - 44 U/L   Alkaline Phosphatase 49 38 - 126 U/L   Total Bilirubin 0.6 0.3 - 1.2 mg/dL   GFR calc non Af Amer >60 >60 mL/min   GFR calc Af Amer >60 >60 mL/min   Anion gap 10 5 - 15    Comment: Performed at [facility], [place] [address], [place], [place] [address]  Ethanol     Status: None   Collection Time: 02/27/19  6:21 PM  Result Value Ref Range   Alcohol, Ethyl (B) <10 <10 mg/dL    Comment: (NOTE) Lowest detectable limit for serum alcohol is 10 mg/dL. For medical purposes only. Performed at [facility], [place] [address], [place], [place] [address]   Hemoglobin A1c     Status: Abnormal   Collection Time: 02/27/19  6:21 PM  Result Value Ref Range   Hgb A1c MFr Bld 4.5 (L) 4.8 - 5.6 %    Comment: (NOTE) Pre diabetes:          5.7%-6.4% Diabetes:              >6.4% Glycemic control for   <7.0% adults with diabetes    Mean Plasma Glucose 82.45 mg/dL    Comment: Performed at [facility], [place] [address], [place], [place] [address]  Lipid panel     Status: Abnormal   Collection Time: 02/27/19  6:21 PM  Result Value Ref Range   Cholesterol 171 0 - 200 mg/dL   Triglycerides 81 <150 mg/dL   HDL 43 >40 mg/dL   Total CHOL/HDL Ratio 4.0 RATIO   VLDL 16 0 - 40 mg/dL   LDL Cholesterol 112 (H) 0 - 99 mg/dL    Comment:        Total Cholesterol/HDL:CHD Risk Coronary Heart Disease Risk Table                     Men   Women  1/2 Average Risk   3.4   3.3  Average Risk       5.0   4.4  2 X Average Risk   9.6   7.1  3 X Average Risk  23.4   11.0        Use the calculated Patient Ratio above and the CHD Risk Table to determine the patient's CHD Risk.        ATP III CLASSIFICATION (LDL):  <100     mg/dL   Optimal  100-129  mg/dL   Near or Above  Optimal  130-159  mg/dL   Borderline  160-189  mg/dL   High  >190     mg/dL   Very High Performed at [place] [address] [place], [place] [address]   TSH     Status: None   Collection Time: 02/27/19  6:21 PM  Result Value Ref Range   TSH 2.384 0.350 - 4.500 uIU/mL    Comment: Performed by a 3rd Generation assay with a functional sensitivity of <=0.01 uIU/mL. Performed at [facility], [place] [address] [place], [place] [address]   Type and screen [place]     Status: None (Preliminary result)   Collection Time: 02/27/19  9:12 PM  Result Value Ref Range   ABO/RH(D) O POS    Antibody Screen NEG    Sample Expiration 03/02/2019,2359    Unit Number [id number]    Blood Component Type RED CELLS,LR    Unit division 00    Status of Unit ALLOCATED    Transfusion Status OK TO TRANSFUSE    Crossmatch Result Compatible    Unit Number [id number]    Blood Component Type RED CELLS,LR    Unit division 00    Status of Unit ISSUED    Transfusion Status OK TO TRANSFUSE    Crossmatch Result      Compatible Performed at [facility], [place] [address], [place], [place] [address]   Iron and TIBC     Status: Abnormal   Collection Time: 02/27/19  9:12 PM  Result Value Ref Range   Iron 12 (L) 28 - 170 ug/dL   TIBC 472 (H) 250 - 450 ug/dL   Saturation Ratios 3 (L) 10.4 - 31.8 %   UIBC 460 ug/dL    Comment: Performed at [facility], [place] [address], [place], [place] [address]  Ferritin (Iron Binding Protein)     Status: Abnormal   Collection Time: 02/27/19  9:12 PM  Result Value Ref Range   Ferritin 2 (L) 11 - 307 ng/mL    Comment: Performed at [facility], [place] [address] [place], [place] [address]  ABO/Rh     Status: None (Preliminary result)   Collection Time: 02/27/19  9:12 PM  Result Value Ref Range   ABO/RH(D)      O POS Performed at [facility], [place] [address] [place], [place] [address]   hCG, quantitative, pregnancy     Status: None   Collection Time: 02/27/19  9:39 PM  Result Value Ref Range   hCG, Beta Chain, Quant, S <1 <5 mIU/mL    Comment:          GEST. AGE      CONC.  (mIU/mL)   <=1 WEEK        5 - 50     2 WEEKS       50 - 500     3 WEEKS       100 - 10,000     4 WEEKS     1,000 - 30,000     5 WEEKS     3,500 - 115,000   6-8 WEEKS      12,000 - 270,000    12 WEEKS     15,000 - 220,000        FEMALE AND NON-PREGNANT FEMALE:     LESS THAN 5 mIU/mL Performed at [facility], [place] [address], [place], [place] [address]  Prepare RBC     Status: None   Collection Time: 02/27/19 10:49 PM  Result Value Ref Range   Order Confirmation      ORDER PROCESSED BY BLOOD BANK Performed at [facility], [place] [address] [place], [place] [address]    No results found.  Pending Labs Unresulted Labs (From admission, onward)    Start     Ordered   02/28/19 0630  CBC  [facility] Morning draw,   R     02/27/19 1439   02/28/19 0630  Hemoglobin A1c  [facility] Morning draw,   R     02/27/19 1439   02/28/19 0630  Ethanol  [facility] Morning draw,   R     02/27/19 1439   02/28/19 0630  Lipid panel  [facility] Morning draw,   R     02/27/19 1439   02/28/19 0630  TSH  [facility] Morning draw,   R     02/27/19 1439   02/28/19 0630  Prolactin  [facility] Morning draw,   R     02/27/19 1439   02/27/19 1821  Prolactin  Once,   R     02/27/19 1820   02/27/19 1440  Urine rapid drug screen (hosp performed)not at [facility]  Once,   R     02/27/19 1439   02/27/19 1440  Pregnancy, urine  Once,   R     02/27/19 1439          Vitals/Pain Today's Vitals   02/28/19 0013 02/28/19 0013 02/28/19 0030 02/28/19 0100  BP:  106/64 107/67 106/68  Pulse:  89 88 92  Resp:  18  19  Temp:  98.8 F (37.1 C)    TempSrc:  Oral    SpO2: 100% 100% 100% 100%  Weight:      Height:      PainSc:        Isolation Precautions No active isolations  Medications Medications  haloperidol lactate (HALDOL) injection 5 mg (has no administration in time range)  diphenhydrAMINE (BENADRYL) capsule 50 mg ( Oral See Alternative 02/27/19 1311)    Or  diphenhydrAMINE (BENADRYL) injection 50 mg (50 mg Intramuscular Given 02/27/19 1311)  LORazepam (ATIVAN) tablet 2 mg (has no administration in time range)    Or  LORazepam (ATIVAN) injection 2 mg (has no administration in time  range)  acetaminophen (TYLENOL) tablet 650 mg (has no administration in time range)  alum & mag hydroxide-simeth (MAALOX/MYLANTA) 200-200-20 MG/5ML suspension 30 mL (has no administration in time range)  0.9 %  sodium chloride infusion (has no administration in time range)  LORazepam (ATIVAN) 2 MG/ML injection (2 mg  Given 02/27/19 1310)  haloperidol lactate (HALDOL) 5 MG/ML injection (5 mg  Given 02/27/19 1310)    Mobility walks

## 2019-02-28 NOTE — ED Notes (Signed)
Pt. Given boxed meal, chips and yogurt.

## 2019-02-28 NOTE — Progress Notes (Signed)
Sitter at door side when patient pulled IV/tele out. Discussed the importance of lines for continued treatment plan. Pt is not agreeable at this time and stable.  Suicidal/Homicidal ideation denied Provider gave V.O to discontinue telemetry monitor, pt has been SR 80's.

## 2019-02-28 NOTE — Progress Notes (Signed)
This am patient is resting well. Pt assessed and denies SI/HI. Pt reports she did not express that. Pt stated "what I said was if I was in a bad situation I'd rather die than do that". Will continue to monitor. Sitter at door side.

## 2019-02-28 NOTE — Consult Note (Signed)
  Medication Review/Recommendation Patient Valerie Howard, 43 y.o., female with prior history of schizophrenia and paranoid behavior.  Request for medication recommendations.  Chart reviewed and consulted with Dr. Dwyane Dee.     After reviewing patient chart patient last hospital visit 09/05/2018 shows that patient was taking the following psychotropic medications:  Depakote 100 mg Q hs for mood stabilization Cogentin 0.5 mg Bid for prevention of EPS Haldol 10 mg Bid Mood stabilization and psychosis Haldol Decanoate 100 mg Q 28 days for mood stabilization and psychosis  It is unclear what current medications patient is on at this time or the last time she took Depakote.  ECG 02/28/19 WNL   Medication Recommendation:   Check Valproic acid level if below therapeutic level start Depakote 500 mg Bid Start Haldol 2 mg Bid Cogentin 0.5 mg Bid   Shuvon B. Rankin, NP

## 2019-02-28 NOTE — H&P (Signed)
History and Physical    Valerie Howard PRF:[id number] DOB: [birth year] DOA: 02/28/2019  PCP: Patient, No Pcp Per  Patient coming from: Patient was transferred from behavioral health.  Chief Complaint: Low hemoglobin.  HPI: Valerie Howard is a 43 y.o. female with history of schizophrenia who was admitted at behavioral health yesterday for psychosis and aggressive behavior was found to have hemoglobin of 5 and was transferred to [facility] for further management of anemia.  Patient does not provide a good history but states that he has some infraumbilical discomfort for last 1 month denies any nausea vomiting or diarrhea and states he has severe menorrhagia for many months.  ED Course: In the ER patient was mildly hypotensive improved with fluids and 2 units of PRBC transfusion orders have been started and admitted.  Patient was involuntary committed at behavioral health for agitation.  Patient denies any chest pain or shortness of breath.  Review of Systems: As per HPI, rest all negative.   Past Medical History:  Diagnosis Date  . History of radiation therapy 07/18/17-07/24/17   right ear, operative bed 12 Gy in 3 fractions  . Keloid    right ear  . Paranoid behavior ([place])   . Schizophrenia [facility])     Past Surgical History:  Procedure Laterality Date  . CESAREAN SECTION    . KENALOG INJECTION Bilateral 07/17/2017   Procedure: KENALOG INJECTION;  Surgeon: Wallace Going, DO;  Location: [place];  Service: Plastics;  Laterality: Bilateral;  . MASS EXCISION N/A 07/17/2017   Procedure: EXCISION KELOID OF RIGHT EAR WITH INTROPERATIVE KENALOG INJECTION AND POST OP RADIATION;  Surgeon: Wallace Going, DO;  Location: [place];  Service: Plastics;  Laterality: N/A;  . TUBAL LIGATION       reports that she has never smoked. She has never used smokeless tobacco. She reports current alcohol use. She reports that she does not use drugs.   Allergies  Allergen Reactions  . Penicillins Rash    Has patient had a PCN reaction causing immediate rash, facial/tongue/throat swelling, SOB or lightheadedness with hypotension:Patient refuses to answer (PRA) Has patient had a PCN reaction causing severe rash involving mucus membranes or skin necrosis:PRA Has patient had a PCN reaction that required hospitalization PRA Has patient had a PCN reaction occurring within the last 10 years:PRA If all of the above answers are "NO", then may proceed with Cephalosporin use.     Family History  Problem Relation Age of Onset  . Hypertension Other   . Diabetes Other   . Cancer Other   . Keloids Mother   . Breast cancer Maternal Grandmother     Prior to Admission medications   Not on File    Physical Exam:  Constitutional: Moderately built and nourished. Vitals:   02/28/19 0157  BP: 110/71  Pulse: 92  Resp: 20  Temp: 98.4 F (36.9 C)  TempSrc: Oral  SpO2: 100%  Weight: 62.2 kg  Height: 5\' 7"  (1.702 m)   Eyes: Anicteric mild pallor. ENMT: No discharge from the ears eyes nose or mouth. Neck: No mass felt.  No neck rigidity. Respiratory: No rhonchi or crepitations. Cardiovascular: S1-S2 heard. Abdomen: Soft nontender bowel sounds present. Musculoskeletal: No edema.  No joint effusion. Skin: No rash. Neurologic: Alert awake oriented to time place and person.  Moves all extremities. Psychiatric: Patient is oriented to time place and person but does not want to communicate and talk much.   Labs  on Admission: I have personally reviewed following labs and imaging studies  CBC: Recent Labs  Lab 02/27/19 1821  WBC 8.0  HGB 5.2*  HCT 21.2*  MCV 63.5*  PLT 720   Basic Metabolic Panel: Recent Labs  Lab 02/27/19 1821  NA 137  K 3.7  CL 103  CO2 24  GLUCOSE 93  BUN 19  CREATININE 0.93  CALCIUM 9.7   GFR: Estimated Creatinine Clearance: 75.1 mL/min (by C-G formula based on SCr of 0.93 mg/dL). Liver Function  Tests: Recent Labs  Lab 02/27/19 1821  AST 15  ALT 9  ALKPHOS 49  BILITOT 0.6  PROT 8.5*  ALBUMIN 4.6   No results for input(s): LIPASE, AMYLASE in the last 168 hours. No results for input(s): AMMONIA in the last 168 hours. Coagulation Profile: No results for input(s): INR, PROTIME in the last 168 hours. Cardiac Enzymes: No results for input(s): CKTOTAL, CKMB, CKMBINDEX, TROPONINI in the last 168 hours. BNP (last 3 results) No results for input(s): PROBNP in the last 8760 hours. HbA1C: Recent Labs    02/27/19 1821  HGBA1C 4.5*   CBG: No results for input(s): GLUCAP in the last 168 hours. Lipid Profile: Recent Labs    02/27/19 1821  CHOL 171  HDL 43  LDLCALC 112*  TRIG 81  CHOLHDL 4.0   Thyroid Function Tests: Recent Labs    02/27/19 1821  TSH 2.384   Anemia Panel: Recent Labs    02/27/19 2112  FERRITIN 2*  TIBC 472*  IRON 12*   Urine analysis:    Component Value Date/Time   COLORURINE YELLOW 12/06/2011 0034   APPEARANCEUR CLEAR 12/06/2011 0034   LABSPEC 1.021 12/06/2011 0034   PHURINE 6.0 12/06/2011 0034   GLUCOSEU NEGATIVE 12/06/2011 0034   HGBUR LARGE (A) 12/06/2011 0034   BILIRUBINUR NEGATIVE 12/06/2011 0034   KETONESUR NEGATIVE 12/06/2011 0034   PROTEINUR NEGATIVE 12/06/2011 0034   UROBILINOGEN 0.2 12/06/2011 0034   NITRITE NEGATIVE 12/06/2011 0034   LEUKOCYTESUR NEGATIVE 12/06/2011 0034   Sepsis Labs: @LABRCNTIP (procalcitonin:4,lacticidven:4) ) Recent Results (from the past 240 hour(s))  SARS Coronavirus 2 (CEPHEID - Performed in [place] hospital lab), Hosp Order     Status: None   Collection Time: 02/27/19  3:47 PM   Specimen: Nasopharyngeal Swab  Result Value Ref Range Status   SARS Coronavirus 2 NEGATIVE NEGATIVE Final    Comment: (NOTE) If result is NEGATIVE SARS-CoV-2 target nucleic acids are NOT DETECTED. The SARS-CoV-2 RNA is generally detectable in upper and lower  respiratory specimens during the acute phase of  infection. The lowest  concentration of SARS-CoV-2 viral copies this assay can detect is 250  copies / mL. A negative result does not preclude SARS-CoV-2 infection  and should not be used as the sole basis for treatment or other  patient management decisions.  A negative result may occur with  improper specimen collection / handling, submission of specimen other  than nasopharyngeal swab, presence of viral mutation(s) within the  areas targeted by this assay, and inadequate number of viral copies  (<250 copies / mL). A negative result must be combined with clinical  observations, patient history, and epidemiological information. If result is POSITIVE SARS-CoV-2 target nucleic acids are DETECTED. The SARS-CoV-2 RNA is generally detectable in upper and lower  respiratory specimens dur ing the acute phase of infection.  Positive  results are indicative of active infection with SARS-CoV-2.  Clinical  correlation with patient history and other diagnostic information is  necessary to  determine patient infection status.  Positive results do  not rule out bacterial infection or co-infection with other viruses. If result is PRESUMPTIVE POSTIVE SARS-CoV-2 nucleic acids MAY BE PRESENT.   A presumptive positive result was obtained on the submitted specimen  and confirmed on repeat testing.  While 2019 novel coronavirus  (SARS-CoV-2) nucleic acids may be present in the submitted sample  additional confirmatory testing may be necessary for epidemiological  and / or clinical management purposes  to differentiate between  SARS-CoV-2 and other Sarbecovirus currently known to infect humans.  If clinically indicated additional testing with an alternate test  methodology [phone]) is advised. The SARS-CoV-2 RNA is generally  detectable in upper and lower respiratory sp ecimens during the acute  phase of infection. The expected result is Negative. Fact Sheet for Patients:   StrictlyIdeas.no Fact Sheet for Healthcare Providers: BankingDealers.co.za This test is not yet approved or cleared by the [place] FDA and has been authorized for detection and/or diagnosis of SARS-CoV-2 by FDA under an Emergency Use Authorization (EUA).  This EUA will remain in effect (meaning this test can be used) for the duration of the COVID-19 declaration under Section 564(b)(1) of the Act, 21 U.S.C. section 360bbb-3(b)(1), unless the authorization is terminated or revoked sooner. Performed at [facility], [facility] [address], [place], [place] [address]      Radiological Exams on Admission: No results found.   Assessment/Plan Principal Problem:   Acute blood loss anemia Active Problems:   Anemia, iron deficiency   Schizoaffective disorder, bipolar type (HCC)   Menorrhagia   Anemia    1. Acute blood loss anemia likely from menorrhagia -patient has declined any pelvic exam or rectal exam in the ER.  Agreed for transfusion of 2 units PRBC which is ongoing.  Anemia panel shows severe iron deficiency and eventually will need iron replacement. 2. Menorrhagia will need follow-up with OB/GYN. 3. Infraumbilical pain -patient on exam does not have any abdominal rigidity or rebound tenderness.  Patient is declining to have any scans done. 4. Schizophrenia with aggressive behavior presently on PRN IV Ativan.  Will need psychiatry input once patient is medically stable.   DVT prophylaxis: SCDs. Code Status: Full code. Family Communication: We will need to discuss with family. Disposition Plan: Probably will need go back to behavioral health. Consults called: None. Admission status: Observation.   Rise Patience MD [facility] Pager [phone].  If 7PM-7AM, please contact night-coverage [email or web address] Password [email or web address]  02/28/2019, 1:59 AM

## 2019-02-28 NOTE — Progress Notes (Signed)
PROGRESS NOTE  Valerie Howard YWV:[id number] DOB: [birth year] DOA: 02/28/2019 PCP: Patient, No Pcp Per No BH observation unit  LOS: 0 days    Patient is from: [facility] observation unit  Brief Narrative / Interim history: 43 y.o.femalewithhistory of schizophrenia who was admitted at behavioral health yesterday for psychosis and aggressive behavior was found to have hemoglobin of 5 and was transferred to [facility] for further management of anemia. Patient does not provide a good history but states that he has some infraumbilical discomfort for last 1 month denies any nausea vomiting or diarrhea and states he has severe menorrhagiafor many months.  She was transfused 2 units overnight with appropriate response.  Hemoglobin 7.3 this morning.  Anemia panel consistent with severe iron deficiency anemia.  Hemodynamically stable.  Transfused 510 mg of Feraheme.  Subjective: This morning, patient does not appear to be in distress.  Sitting in bed talking over the phone.  Unfortunately not willing to talk to me.  She says "I do not trust you.  I do want to talk to you.  Send someone else.  Hospital bed is not a hotel.'  Refused physical exam.  Talking over the phone.  Objective: Vitals:   02/28/19 0500 02/28/19 0615 02/28/19 1228 02/28/19 1243  BP: 110/65 110/62 125/75 126/76  Pulse: 80 80 81 79  Resp: 20 17 16 16   Temp: 98.3 F (36.8 C) 98.8 F (37.1 C) 99 F (37.2 C) 98.6 F (37 C)  TempSrc: Oral Oral Oral Oral  SpO2: 100% 100% 100% 100%  Weight:      Height:        Intake/Output Summary (Last 24 hours) at 02/28/2019 1614 Last data filed at 02/28/2019 1434 Gross per 24 hour  Intake 1171 ml  Output -  Net 1171 ml   Filed Weights   02/28/19 0157  Weight: 62.2 kg    Examination: GENERAL: No acute distress.  Appears well.  HEENT: Grossly normal. NECK: Grossly normal. LUNGS:  No IWOB.  On room air. HEART: No apparent edema. ABD: Patient did not allow me to examine.  MSK/EXT:  Moves all extremities. No apparent deformity or edema. SKIN: no apparent skin lesion or wound NEURO: Awake, alert.  No gross neuro deficits. PSYCH: Irritable.  Not cooperative  I have personally reviewed the following labs and images:  Radiology Studies: No results found.  Microbiology: Recent Results (from the past 240 hour(s))  SARS Coronavirus 2 (CEPHEID - Performed in [place] hospital lab), Hosp Order     Status: None   Collection Time: 02/27/19  3:47 PM   Specimen: Nasopharyngeal Swab  Result Value Ref Range Status   SARS Coronavirus 2 NEGATIVE NEGATIVE Final    Comment: (NOTE) If result is NEGATIVE SARS-CoV-2 target nucleic acids are NOT DETECTED. The SARS-CoV-2 RNA is generally detectable in upper and lower  respiratory specimens during the acute phase of infection. The lowest  concentration of SARS-CoV-2 viral copies this assay can detect is 250  copies / mL. A negative result does not preclude SARS-CoV-2 infection  and should not be used as the sole basis for treatment or other  patient management decisions.  A negative result may occur with  improper specimen collection / handling, submission of specimen other  than nasopharyngeal swab, presence of viral mutation(s) within the  areas targeted by this assay, and inadequate number of viral copies  (<250 copies / mL). A negative result must be combined with clinical  observations, patient history, and epidemiological information.  If result is POSITIVE SARS-CoV-2 target nucleic acids are DETECTED. The SARS-CoV-2 RNA is generally detectable in upper and lower  respiratory specimens dur ing the acute phase of infection.  Positive  results are indicative of active infection with SARS-CoV-2.  Clinical  correlation with patient history and other diagnostic information is  necessary to determine patient infection status.  Positive results do  not rule out bacterial infection or co-infection with other viruses.  If result is PRESUMPTIVE POSTIVE SARS-CoV-2 nucleic acids MAY BE PRESENT.   A presumptive positive result was obtained on the submitted specimen  and confirmed on repeat testing.  While 2019 novel coronavirus  (SARS-CoV-2) nucleic acids may be present in the submitted sample  additional confirmatory testing may be necessary for epidemiological  and / or clinical management purposes  to differentiate between  SARS-CoV-2 and other Sarbecovirus currently known to infect humans.  If clinically indicated additional testing with an alternate test  methodology [phone]) is advised. The SARS-CoV-2 RNA is generally  detectable in upper and lower respiratory sp ecimens during the acute  phase of infection. The expected result is Negative. Fact Sheet for Patients:  StrictlyIdeas.no Fact Sheet for Healthcare Providers: BankingDealers.co.za This test is not yet approved or cleared by the [place] FDA and has been authorized for detection and/or diagnosis of SARS-CoV-2 by FDA under an Emergency Use Authorization (EUA).  This EUA will remain in effect (meaning this test can be used) for the duration of the COVID-19 declaration under Section 564(b)(1) of the Act, 21 U.S.C. section 360bbb-3(b)(1), unless the authorization is terminated or revoked sooner. Performed at [facility], [name] [address] [place], [place] [address]     Sepsis Labs: Invalid input(s): PROCALCITONIN, LACTICIDVEN  Urine analysis:    Component Value Date/Time   COLORURINE YELLOW 12/06/2011 0034   APPEARANCEUR CLEAR 12/06/2011 0034   LABSPEC 1.021 12/06/2011 0034   PHURINE 6.0 12/06/2011 0034   GLUCOSEU NEGATIVE 12/06/2011 0034   HGBUR LARGE (A) 12/06/2011 0034   BILIRUBINUR NEGATIVE 12/06/2011 0034   KETONESUR NEGATIVE 12/06/2011 0034   PROTEINUR NEGATIVE 12/06/2011 0034   UROBILINOGEN 0.2 12/06/2011 0034   NITRITE NEGATIVE 12/06/2011 0034    LEUKOCYTESUR NEGATIVE 12/06/2011 0034    Anemia Panel: Recent Labs    02/27/19 2112  FERRITIN 2*  TIBC 472*  IRON 12*    Thyroid Function Tests: Recent Labs    02/27/19 1821  TSH 2.384    Lipid Profile: Recent Labs    02/27/19 1821  CHOL 171  HDL 43  LDLCALC 112*  TRIG 81  CHOLHDL 4.0    CBG: No results for input(s): GLUCAP in the last 168 hours.  HbA1C: Recent Labs    02/27/19 1821  HGBA1C 4.5*    BNP (last 3 results): No results for input(s): PROBNP in the last 8760 hours.  Cardiac Enzymes: No results for input(s): CKTOTAL, CKMB, CKMBINDEX, TROPONINI in the last 168 hours.  Coagulation Profile: No results for input(s): INR, PROTIME in the last 168 hours.  Liver Function Tests: Recent Labs  Lab 02/27/19 1821 02/28/19 0838  AST 15 12*  ALT 9 8  ALKPHOS 49 48  BILITOT 0.6 0.7  PROT 8.5* 7.4  ALBUMIN 4.6 3.9   No results for input(s): LIPASE, AMYLASE in the last 168 hours. No results for input(s): AMMONIA in the last 168 hours.  Basic Metabolic Panel: Recent Labs  Lab 02/27/19 1821 02/28/19 0838  NA 137 137  K 3.7 4.0  CL 103 108  CO2 24 22  GLUCOSE 93 94  BUN 19 17  CREATININE 0.93 0.82  CALCIUM 9.7 9.1   GFR: Estimated Creatinine Clearance: 85.1 mL/min (by C-G formula based on SCr of 0.82 mg/dL).  CBC: Recent Labs  Lab 02/27/19 1821 02/28/19 0838 02/28/19 1408  WBC 8.0 4.9  --   HGB 5.2* 7.3* 7.2*  HCT 21.2* 26.3* 25.2*  MCV 63.5* 71.7*  --   PLT 303 236  --     Procedures: None  Microbiology: None  Assessment & Plan: Iron deficiency anemia due likely to abnormal uterine bleed: Hemoglobin down to 5 and transfused 2 units with appropriate response.  Hemoglobin up to 7.3.  Hemodynamically stable.  Transfused Feraheme 510 mg once. -Recheck H&H in the morning -Start p.o. iron -Outpatient follow-up with PCP -CBC at follow-up  Abnormal uterine bleeding: H&H stable suggesting no significant active bleeding.   Unfortunately, patient did not allow [place] to examine her. -Outpatient GYN follow-up  Schizophrenia/mood disorder:  unfortunately not willing to talk to me.  She says "I do not trust you.  I do want to talk to you.  Send someone else.  Hospital bed is not a hotel.'  Refused physical exam.  Talking over the phone.  Safety sitter in place. -PRN haldol for agitation-no QTC on EKG. -Inpatient psych consult placed. -Continue one to one safety sitter   DVT prophylaxis: SCD Code Status: Full code Family Communication: Confidential encounter Disposition Plan: Medically ready for discharge pending bed availability at [facility]. Consultants: Psych  Antimicrobials: Anti-infectives (From admission, onward)   None      Sch Meds:  Scheduled Meds: Continuous Infusions: PRN Meds:.acetaminophen **OR** acetaminophen, haloperidol **OR** haloperidol lactate, LORazepam, ondansetron **OR** ondansetron (ZOFRAN) IV  20 minutes with more than 50% spent in reviewing records, counseling patient and coordinating care.  Taye T. [place]  If 7PM-7AM, please contact night-coverage [email or web address] Password [email or web address] 02/28/2019, 4:14 PM

## 2019-03-01 DIAGNOSIS — N939 Abnormal uterine and vaginal bleeding, unspecified: Secondary | ICD-10-CM

## 2019-03-01 LAB — CBC
HCT: 27 % — ABNORMAL LOW (ref 36.0–46.0)
Hemoglobin: 7.2 g/dL — ABNORMAL LOW (ref 12.0–15.0)
MCH: 19.7 pg — ABNORMAL LOW (ref 26.0–34.0)
MCHC: 26.7 g/dL — ABNORMAL LOW (ref 30.0–36.0)
MCV: 73.8 fL — ABNORMAL LOW (ref 80.0–100.0)
Platelets: 233 10*3/uL (ref 150–400)
RBC: 3.66 MIL/uL — ABNORMAL LOW (ref 3.87–5.11)
RDW: 29.3 % — ABNORMAL HIGH (ref 11.5–15.5)
WBC: 6.2 10*3/uL (ref 4.0–10.5)
nRBC: 0.5 % — ABNORMAL HIGH (ref 0.0–0.2)

## 2019-03-01 MED ORDER — FERROUS SULFATE 325 (65 FE) MG PO TABS
325.0000 mg | ORAL_TABLET | Freq: Two times a day (BID) | ORAL | Status: DC
  Filled 2019-03-01 (×3): qty 1

## 2019-03-01 MED ORDER — BENZTROPINE MESYLATE 0.5 MG PO TABS: 0.5000 mg | ORAL_TABLET | Freq: Two times a day (BID) | ORAL | Status: DC

## 2019-03-01 MED ORDER — VALPROIC ACID 250 MG PO CAPS
500.0000 mg | ORAL_CAPSULE | Freq: Two times a day (BID) | ORAL | Status: DC
  Administered 2019-03-05: 500 mg via ORAL
  Filled 2019-03-01 (×12): qty 2

## 2019-03-01 MED ORDER — HALOPERIDOL LACTATE 5 MG/ML IJ SOLN
2.0000 mg | Freq: Two times a day (BID) | INTRAMUSCULAR | Status: DC
  Administered 2019-03-04: 17:00:00 2 mg via INTRAMUSCULAR
  Filled 2019-03-01 (×2): qty 1

## 2019-03-01 MED ORDER — BENZTROPINE MESYLATE 1 MG/ML IJ SOLN
0.5000 mg | Freq: Two times a day (BID) | INTRAMUSCULAR | Status: DC
  Administered 2019-03-04: 0.5 mg via INTRAMUSCULAR
  Filled 2019-03-01 (×12): qty 0.5

## 2019-03-01 MED ORDER — HALOPERIDOL 2 MG PO TABS
2.0000 mg | ORAL_TABLET | Freq: Two times a day (BID) | ORAL | Status: DC
  Administered 2019-03-04 – 2019-03-06 (×4): 2 mg via ORAL
  Filled 2019-03-01 (×12): qty 1

## 2019-03-01 MED ORDER — HALOPERIDOL 2 MG PO TABS
2.0000 mg | ORAL_TABLET | Freq: Two times a day (BID) | ORAL | Status: DC
  Filled 2019-03-01: qty 1

## 2019-03-01 MED ORDER — SENNA 8.6 MG PO TABS: 1.0000 | ORAL_TABLET | Freq: Every day | ORAL | Status: DC | PRN

## 2019-03-01 MED ORDER — BENZTROPINE MESYLATE 0.5 MG PO TABS
0.5000 mg | ORAL_TABLET | Freq: Two times a day (BID) | ORAL | Status: DC
  Administered 2019-03-04 – 2019-03-06 (×4): 0.5 mg via ORAL
  Filled 2019-03-01 (×6): qty 1

## 2019-03-01 NOTE — Progress Notes (Signed)
Patient refused morning vitals. Will continue to monitor patient.

## 2019-03-01 NOTE — Progress Notes (Signed)
PROGRESS NOTE  Valerie Howard LSL:[id number] DOB: 02/07/1976 DOA: 02/28/2019 PCP: Patient, No Pcp Per No BH observation unit  LOS: 1 day    Patient is from: [facility] observation unit  Brief Narrative / Interim history: 43 y.o.femalewithhistory of schizophrenia who was admitted at behavioral health yesterday for psychosis and aggressive behavior was found to have hemoglobin of 5 and was transferred to [facility] for further management of anemia. Patient does not provide a good history but states that he has some infraumbilical discomfort for last 1 month denies any nausea vomiting or diarrhea and states he has severe menorrhagiafor many months.  She was transfused 2 units overnight with appropriate response.  Hemoglobin 7.3 this morning.  Anemia panel consistent with severe iron deficiency anemia.  Hemodynamically stable.  Transfused 510 mg of Feraheme.  Subjective: No major events overnight of this morning.  Refuses to talk saying "we have nothing to talk about. Have a great day".  Sitting in bed.  Does not appear to be in distress.  Hgb remained stable.  Objective: Vitals:   02/28/19 2318 03/01/19 0030 03/01/19 0724 03/01/19 1344  BP: (!) 96/58 (!) 102/58 137/74 (!) 142/89  Pulse: 73 66 78 86  Resp: 18  18 16   Temp: 98.7 F (37.1 C)  98.4 F (36.9 C) 98.9 F (37.2 C)  TempSrc: Oral  Oral Oral  SpO2: 100%  100% 100%  Weight:      Height:        Intake/Output Summary (Last 24 hours) at 03/01/2019 1509 Last data filed at 03/01/2019 1259 Gross per 24 hour  Intake 600 ml  Output -  Net 600 ml   Filed Weights   02/28/19 0157  Weight: 62.2 kg    Examination:  GENERAL: No acute distress. HEENT: Grossly normal NECK: Grossly normal LUNGS:  No IWOB.  On room air HEART: No apparent edema ABD: Patient did not allow me to examine MSK/EXT:  Moves all extremities.  No apparent deformity or edema SKIN: no apparent skin lesion or wound NEURO: Awake, alert and oriented  appropriately.  No gross deficit.  PSYCH: Calm but not cooperative  I have personally reviewed the following labs and images:  Radiology Studies: No results found.  Microbiology: Recent Results (from the past 240 hour(s))  SARS Coronavirus 2 (CEPHEID - Performed in [place] hospital lab), Hosp Order     Status: None   Collection Time: 02/27/19  3:47 PM   Specimen: Nasopharyngeal Swab  Result Value Ref Range Status   SARS Coronavirus 2 NEGATIVE NEGATIVE Final    Comment: (NOTE) If result is NEGATIVE SARS-CoV-2 target nucleic acids are NOT DETECTED. The SARS-CoV-2 RNA is generally detectable in upper and lower  respiratory specimens during the acute phase of infection. The lowest  concentration of SARS-CoV-2 viral copies this assay can detect is 250  copies / mL. A negative result does not preclude SARS-CoV-2 infection  and should not be used as the sole basis for treatment or other  patient management decisions.  A negative result may occur with  improper specimen collection / handling, submission of specimen other  than nasopharyngeal swab, presence of viral mutation(s) within the  areas targeted by this assay, and inadequate number of viral copies  (<250 copies / mL). A negative result must be combined with clinical  observations, patient history, and epidemiological information. If result is POSITIVE SARS-CoV-2 target nucleic acids are DETECTED. The SARS-CoV-2 RNA is generally detectable in upper and lower  respiratory specimens  dur ing the acute phase of infection.  Positive  results are indicative of active infection with SARS-CoV-2.  Clinical  correlation with patient history and other diagnostic information is  necessary to determine patient infection status.  Positive results do  not rule out bacterial infection or co-infection with other viruses. If result is PRESUMPTIVE POSTIVE SARS-CoV-2 nucleic acids MAY BE PRESENT.   A presumptive positive result was obtained on  the submitted specimen  and confirmed on repeat testing.  While 2019 novel coronavirus  (SARS-CoV-2) nucleic acids may be present in the submitted sample  additional confirmatory testing may be necessary for epidemiological  and / or clinical management purposes  to differentiate between  SARS-CoV-2 and other Sarbecovirus currently known to infect humans.  If clinically indicated additional testing with an alternate test  methodology [phone]) is advised. The SARS-CoV-2 RNA is generally  detectable in upper and lower respiratory sp ecimens during the acute  phase of infection. The expected result is Negative. Fact Sheet for Patients:  StrictlyIdeas.no Fact Sheet for Healthcare Providers: BankingDealers.co.za This test is not yet approved or cleared by the [place] FDA and has been authorized for detection and/or diagnosis of SARS-CoV-2 by FDA under an Emergency Use Authorization (EUA).  This EUA will remain in effect (meaning this test can be used) for the duration of the COVID-19 declaration under Section 564(b)(1) of the Act, 21 U.S.C. section 360bbb-3(b)(1), unless the authorization is terminated or revoked sooner. Performed at [facility], [place] [address], [place], [place] [address]     Sepsis Labs: Invalid input(s): PROCALCITONIN, LACTICIDVEN  Urine analysis:    Component Value Date/Time   COLORURINE YELLOW 12/06/2011 0034   APPEARANCEUR CLEAR 12/06/2011 0034   LABSPEC 1.021 12/06/2011 0034   PHURINE 6.0 12/06/2011 0034   GLUCOSEU NEGATIVE 12/06/2011 0034   HGBUR LARGE (A) 12/06/2011 0034   BILIRUBINUR NEGATIVE 12/06/2011 0034   KETONESUR NEGATIVE 12/06/2011 0034   PROTEINUR NEGATIVE 12/06/2011 0034   UROBILINOGEN 0.2 12/06/2011 0034   NITRITE NEGATIVE 12/06/2011 0034   LEUKOCYTESUR NEGATIVE 12/06/2011 0034    Anemia Panel: Recent Labs    02/27/19 2112  FERRITIN 2*  TIBC 472*  IRON 12*     Thyroid Function Tests: Recent Labs    02/27/19 1821  TSH 2.384    Lipid Profile: Recent Labs    02/27/19 1821  CHOL 171  HDL 43  LDLCALC 112*  TRIG 81  CHOLHDL 4.0    CBG: No results for input(s): GLUCAP in the last 168 hours.  HbA1C: Recent Labs    02/27/19 1821  HGBA1C 4.5*    BNP (last 3 results): No results for input(s): PROBNP in the last 8760 hours.  Cardiac Enzymes: No results for input(s): CKTOTAL, CKMB, CKMBINDEX, TROPONINI in the last 168 hours.  Coagulation Profile: No results for input(s): INR, PROTIME in the last 168 hours.  Liver Function Tests: Recent Labs  Lab 02/27/19 1821 02/28/19 0838  AST 15 12*  ALT 9 8  ALKPHOS 49 48  BILITOT 0.6 0.7  PROT 8.5* 7.4  ALBUMIN 4.6 3.9   No results for input(s): LIPASE, AMYLASE in the last 168 hours. No results for input(s): AMMONIA in the last 168 hours.  Basic Metabolic Panel: Recent Labs  Lab 02/27/19 1821 02/28/19 0838  NA 137 137  K 3.7 4.0  CL 103 108  CO2 24 22  GLUCOSE 93 94  BUN 19 17  CREATININE 0.93 0.82  CALCIUM 9.7 9.1   GFR: Estimated  Creatinine Clearance: 85.1 mL/min (by C-G formula based on SCr of 0.82 mg/dL).  CBC: Recent Labs  Lab 02/27/19 1821 02/28/19 0838 02/28/19 1408 03/01/19 0408  WBC 8.0 4.9  --  6.2  HGB 5.2* 7.3* 7.2* 7.2*  HCT 21.2* 26.3* 25.2* 27.0*  MCV 63.5* 71.7*  --  73.8*  PLT 303 236  --  233    Procedures: None  Microbiology: None  Assessment & Plan: Iron deficiency anemia due likely to abnormal uterine bleed: Hemoglobin down to 5 and transfused 2 units with appropriate response. Transfused Feraheme 510 mg once. -H&H stable. -Start p.o. iron -Outpatient follow-up with PCP -CBC at follow-up  Abnormal uterine bleeding: H&H stable suggesting no significant active bleeding.  Unfortunately, patient did not allow [place] to examine her. -Outpatient GYN follow-up  Schizophrenia/mood disorder:  unfortunately not willing to talk to me.  Refused physical exam as well. Safety sitter in place. -Appreciate help by psych-check Depakote level, start Depakote, Haldol and Cogentin -Monitor QTC -Remains under IVC -Continue one to one safety sitter   DVT prophylaxis: SCD Code Status: Full code Family Communication: Confidential encounter Disposition Plan: Medically ready for discharge pending bed availability at [facility]. Consultants: Psych  Antimicrobials: Anti-infectives (From admission, onward)   None      Sch Meds:  Scheduled Meds: . benztropine  0.5 mg Oral BID  . ferrous sulfate  325 mg Oral BID WC  . haloperidol  2 mg Oral BID  . valproic acid  500 mg Oral BID   Continuous Infusions: PRN Meds:.acetaminophen **OR** acetaminophen, haloperidol **OR** haloperidol lactate, LORazepam, ondansetron **OR** ondansetron (ZOFRAN) IV, senna   T. Lamar  If 7PM-7AM, please contact night-coverage [email or web address] Password [email or web address] 03/01/2019, 3:09 PM
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
## 2019-03-01 NOTE — Progress Notes (Signed)
Pt has refused all medications as well as care from this RN. Pt asked this RN " to leave the room that you are making her worse". RN let the pt know she would leave and that  if there was anything she could do to RN know. Will continue to monitor.

## 2019-03-02 LAB — HIV ANTIBODY (ROUTINE TESTING W REFLEX): HIV Screen 4th Generation wRfx: NONREACTIVE

## 2019-03-02 MED ORDER — FOLIC ACID 1 MG PO TABS
1.0000 mg | ORAL_TABLET | Freq: Every day | ORAL | Status: DC
  Filled 2019-03-02 (×3): qty 1

## 2019-03-02 MED ORDER — VITAMIN B-12 100 MCG PO TABS
100.0000 ug | ORAL_TABLET | Freq: Every day | ORAL | Status: DC
  Filled 2019-03-02 (×5): qty 1

## 2019-03-02 NOTE — TOC Progression Note (Addendum)
Transition of Care [facility]) - Progression Note    Patient Details  Name: Valerie Howard MRN: [id number] Date of Birth: [birth year]  Transition of Care [facility]) CM/SW Contact  Servando Snare, [place] Phone Number: 03/02/2019, 10:28 AM  Clinical Narrative:   LCSW consulted at [facility] for inpatient psych placement. Patient is IVC'd. Patient under review at [facility].   11:07 AM Patient denied by [facility]. LCSW will fax patient out to other facilities.        Expected Discharge Plan and Services                                                 Social Determinants of Health (SDOH) Interventions    Readmission Risk Interventions No flowsheet data found.

## 2019-03-02 NOTE — Progress Notes (Signed)
Pt refusing all morning medications and lab draws. Pt also refusing to allow any direct care or assessments to take place. Pt states "I don't need help from you." Will continue to monitor pt condition

## 2019-03-02 NOTE — Progress Notes (Signed)
Patient refused morning Vitals and lab Draws. PCP was notified

## 2019-03-02 NOTE — Progress Notes (Signed)
PROGRESS NOTE    Valerie Howard  HUD:[id number] DOB: 08-Dec-1975 DOA: 02/28/2019 PCP: Patient, No Pcp Per   Brief Narrative: 43 y.o.femalewithhistory of schizophrenia who was admitted at behavioral health yesterday for psychosis and aggressive behavior was found to have hemoglobin of 5 and was transferred to [facility] long hospital on 7-02 for further management of anemia. Patient does not provide a good history but states that he has some infraumbilical discomfort for last 1 month denies any nausea vomiting or diarrhea and states he has severe menorrhagiafor many months.  She was transfused 2 units overnight with appropriate response.  Hemoglobin 7.3 this morning.  Anemia panel consistent with severe iron deficiency anemia.  Hemodynamically stable.  Transfused 510 mg of Feraheme.    Assessment & Plan:   Principal Problem:   Schizoaffective disorder, bipolar type ([place]) Active Problems:   Anemia, iron deficiency   Menorrhagia   Acute blood loss anemia   Anemia      1-iron deficiency anemia; likely due to abnormal uterine bleed Patient with a hemoglobin of 5.  She has received 2 units of packed red blood cell.  He also has CF fever him. Hemoglobin increased to 7. Patient continued to decline labs drawn. She is stable to be transferred to behavioral health Continue with iron supplement folic acid.  Abnormal uterine bleeding: Need to follow-up with gynecology  Schizophrenia/mood disorder: Pain is still not comparative.  She has not received medication home the last 2 days.  I discussed with nursing staff patient will need to get her medications intramuscular as recommended by psychiatric She is under IVC. Awaiting bed at behavioral health    Estimated body mass index is 21.48 kg/m as calculated from the following:   Height as of this encounter: 5\' 7"  (1.702 m).   Weight as of this encounter: 62.2 kg.   DVT prophylaxis: scd Code Status: full code Family  Communication: no family at bedside Disposition Plan: Awaiting bed at [facility]  Consultants:   Psych   Procedures:   none  Antimicrobials:  None  Subjective: Refuse labs, refusing medications.  She is talking over phone.  Psychosis   Objective: Vitals:   03/01/19 0030 03/01/19 0724 03/01/19 1344 03/01/19 2103  BP: (!) 102/58 137/74 (!) 142/89 115/72  Pulse: 66 78 86 72  Resp:  18 16 20   Temp:  98.4 F (36.9 C) 98.9 F (37.2 C) 98.5 F (36.9 C)  TempSrc:  Oral Oral Oral  SpO2:  100% 100% 99%  Weight:      Height:        Intake/Output Summary (Last 24 hours) at 03/02/2019 1048 Last data filed at 03/01/2019 2210 Gross per 24 hour  Intake 720 ml  Output -  Net 720 ml   Filed Weights   02/28/19 0157  Weight: 62.2 kg    Examination: Refuse to be examined.  General; not cooperative.  Respiratory; normal; respiratory effort.       Data Reviewed: I have personally reviewed following labs and imaging studies  CBC: Recent Labs  Lab 02/27/19 1821 02/28/19 0838 02/28/19 1408 03/01/19 0408  WBC 8.0 4.9  --  6.2  HGB 5.2* 7.3* 7.2* 7.2*  HCT 21.2* 26.3* 25.2* 27.0*  MCV 63.5* 71.7*  --  73.8*  PLT 303 236  --  858   Basic Metabolic Panel: Recent Labs  Lab 02/27/19 1821 02/28/19 0838  NA 137 137  K 3.7 4.0  CL 103 108  CO2 24 22  GLUCOSE 93 94  BUN 19 17  CREATININE 0.93 0.82  CALCIUM 9.7 9.1   GFR: Estimated Creatinine Clearance: 85.1 mL/min (by C-G formula based on SCr of 0.82 mg/dL). Liver Function Tests: Recent Labs  Lab 02/27/19 1821 02/28/19 0838  AST 15 12*  ALT 9 8  ALKPHOS 49 48  BILITOT 0.6 0.7  PROT 8.5* 7.4  ALBUMIN 4.6 3.9   No results for input(s): LIPASE, AMYLASE in the last 168 hours. No results for input(s): AMMONIA in the last 168 hours. Coagulation Profile: No results for input(s): INR, PROTIME in the last 168 hours. Cardiac Enzymes: No results for input(s): CKTOTAL, CKMB, CKMBINDEX, TROPONINI in the last 168 hours. BNP  (last 3 results) No results for input(s): PROBNP in the last 8760 hours. HbA1C: Recent Labs    02/27/19 1821  HGBA1C 4.5*   CBG: No results for input(s): GLUCAP in the last 168 hours. Lipid Profile: Recent Labs    02/27/19 1821  CHOL 171  HDL 43  LDLCALC 112*  TRIG 81  CHOLHDL 4.0   Thyroid Function Tests: Recent Labs    02/27/19 1821  TSH 2.384   Anemia Panel: Recent Labs    02/27/19 2112  FERRITIN 2*  TIBC 472*  IRON 12*   Sepsis Labs: No results for input(s): PROCALCITON, LATICACIDVEN in the last 168 hours.  Recent Results (from the past 240 hour(s))  SARS Coronavirus 2 (CEPHEID - Performed in [place] hospital lab), Hosp Order     Status: None   Collection Time: 02/27/19  3:47 PM   Specimen: Nasopharyngeal Swab  Result Value Ref Range Status   SARS Coronavirus 2 NEGATIVE NEGATIVE Final    Comment: (NOTE) If result is NEGATIVE SARS-CoV-2 target nucleic acids are NOT DETECTED. The SARS-CoV-2 RNA is generally detectable in upper and lower  respiratory specimens during the acute phase of infection. The lowest  concentration of SARS-CoV-2 viral copies this assay can detect is 250  copies / mL. A negative result does not preclude SARS-CoV-2 infection  and should not be used as the sole basis for treatment or other  patient management decisions.  A negative result may occur with  improper specimen collection / handling, submission of specimen other  than nasopharyngeal swab, presence of viral mutation(s) within the  areas targeted by this assay, and inadequate number of viral copies  (<250 copies / mL). A negative result must be combined with clinical  observations, patient history, and epidemiological information. If result is POSITIVE SARS-CoV-2 target nucleic acids are DETECTED. The SARS-CoV-2 RNA is generally detectable in upper and lower  respiratory specimens dur ing the acute phase of infection.  Positive  results are indicative of active infection  with SARS-CoV-2.  Clinical  correlation with patient history and other diagnostic information is  necessary to determine patient infection status.  Positive results do  not rule out bacterial infection or co-infection with other viruses. If result is PRESUMPTIVE POSTIVE SARS-CoV-2 nucleic acids MAY BE PRESENT.   A presumptive positive result was obtained on the submitted specimen  and confirmed on repeat testing.  While 2019 novel coronavirus  (SARS-CoV-2) nucleic acids may be present in the submitted sample  additional confirmatory testing may be necessary for epidemiological  and / or clinical management purposes  to differentiate between  SARS-CoV-2 and other Sarbecovirus currently known to infect humans.  If clinically indicated additional testing with an alternate test  methodology [phone]) is advised. The SARS-CoV-2 RNA is generally  detectable in upper and lower respiratory sp ecimens  during the acute  phase of infection. The expected result is Negative. Fact Sheet for Patients:  StrictlyIdeas.no Fact Sheet for Healthcare Providers: BankingDealers.co.za This test is not yet approved or cleared by the [place] FDA and has been authorized for detection and/or diagnosis of SARS-CoV-2 by FDA under an Emergency Use Authorization (EUA).  This EUA will remain in effect (meaning this test can be used) for the duration of the COVID-19 declaration under Section 564(b)(1) of the Act, 21 U.S.C. section 360bbb-3(b)(1), unless the authorization is terminated or revoked sooner. Performed at [facility], [place] [address] [place],  [address]          Radiology Studies: No results found.      Scheduled Meds: . benztropine  0.5 mg Oral BID   Or  . benztropine mesylate  0.5 mg Intramuscular BID  . ferrous sulfate  325 mg Oral BID WC  . folic acid  1 mg Oral Daily  . haloperidol  2 mg Oral BID   Or  .  haloperidol lactate  2 mg Intramuscular BID  . valproic acid  500 mg Oral BID   Continuous Infusions:   LOS: 2 days    Time spent: 35 minutes.     Elmarie Shiley, MD [facility] Pager [phone]  If 7PM-7AM, please contact night-coverage [email or web address] Password [email or web address] 03/02/2019, 10:48 AM

## 2019-03-02 NOTE — Progress Notes (Signed)
Patient refused meds and for RN to do the shift  assessment.  RN reminded the patient to call staff if she needed anything.

## 2019-03-03 LAB — TYPE AND SCREEN
ABO/RH(D): O POS
Antibody Screen: NEGATIVE
Unit division: 0
Unit division: 0
Unit division: 0
Unit division: 0

## 2019-03-03 LAB — BPAM RBC
Blood Product Expiration Date: 202007262359
Blood Product Expiration Date: 202007282359
Blood Product Expiration Date: 202007282359
Blood Product Expiration Date: 202007282359
ISSUE DATE / TIME: 202007012342
ISSUE DATE / TIME: 202007020312
Unit Type and Rh: 5100
Unit Type and Rh: 5100
Unit Type and Rh: 5100
Unit Type and Rh: 5100

## 2019-03-03 MED ORDER — FOLIC ACID 1 MG PO TABS: 1.0000 mg | ORAL_TABLET | Freq: Every day | ORAL | 0 refills | Status: DC

## 2019-03-03 MED ORDER — FERROUS SULFATE 325 (65 FE) MG PO TABS: 325.0000 mg | ORAL_TABLET | Freq: Two times a day (BID) | ORAL | 3 refills | Status: DC

## 2019-03-03 MED ORDER — BENZTROPINE MESYLATE 0.5 MG PO TABS: 0.5000 mg | ORAL_TABLET | Freq: Two times a day (BID) | ORAL | 0 refills | Status: DC

## 2019-03-03 MED ORDER — VALPROIC ACID 250 MG PO CAPS: 500.0000 mg | ORAL_CAPSULE | Freq: Two times a day (BID) | ORAL | 0 refills | Status: DC

## 2019-03-03 MED ORDER — HALOPERIDOL 2 MG PO TABS: 2.0000 mg | ORAL_TABLET | Freq: Two times a day (BID) | ORAL | 0 refills | Status: DC

## 2019-03-03 MED ORDER — CYANOCOBALAMIN 100 MCG PO TABS: 100.0000 ug | ORAL_TABLET | Freq: Every day | ORAL | 0 refills | Status: DC

## 2019-03-03 NOTE — Discharge Summary (Signed)
Physician Discharge Summary  Valerie Howard ZMO:[id number] DOB: [birth year] DOA: 02/28/2019  PCP: Patient, No Pcp Per  Admit date: 02/28/2019 Discharge date: 03/03/2019  Admitted From: [place] Disposition:  [place]  Recommendations for Outpatient Follow-up:  1. Follow up with PCP in 1-2 weeks 2. Please obtain BMP/CBC in one week 3. Make sure patient takes oral iron and B 12.    Discharge Condition: Stable.  CODE STATUS: Full Code Diet recommendation: Heart Healthy   Brief/Interim Summary: 43 y.o.femalewithhistory of schizophrenia who was admitted at behavioral health yesterday for psychosis and aggressive behavior was found to have hemoglobin of 5 and was transferred to [facility] long hospital on 7-02 for further management of anemia. Patient does not provide a good history but states that he has some infraumbilical discomfort for last 1 month denies any nausea vomiting or diarrhea and states he has severe menorrhagiafor many months.  She was transfused 2 units overnight with appropriate response. Hemoglobin 7.3 this morning. Anemia panel consistent with severe iron deficiency anemia. Hemodynamically stable. Transfused 510 mg of Feraheme.    1-iron deficiency anemia; likely due to abnormal uterine bleed Patient with a hemoglobin of 5.  She has received 2 units of packed red blood cell.  He also has CF fever him. Hemoglobin increased to 7. Patient continued to decline labs drawn. She is stable to be transferred to behavioral health Continue with iron supplement folic acid.  Abnormal uterine bleeding: Need to follow-up with gynecology  Schizophrenia/mood disorder: Pain is still not comparative.  Patient has been refusing her psychiatrist medications.  She is under IVC. Awaiting bed at behavioral health    Discharge Diagnoses:  Principal Problem:   Schizoaffective disorder, bipolar type ([place]) Active Problems:   Anemia, iron deficiency   Menorrhagia   Acute blood loss  anemia   Anemia    Discharge Instructions  Discharge Instructions    Diet - low sodium heart healthy   Complete by: As directed    Increase activity slowly   Complete by: As directed      Allergies as of 03/03/2019      Reactions   Penicillins Rash   Has patient had a PCN reaction causing immediate rash, facial/tongue/throat swelling, SOB or lightheadedness with hypotension:Patient refuses to answer (PRA) Has patient had a PCN reaction causing severe rash involving mucus membranes or skin necrosis:PRA Has patient had a PCN reaction that required hospitalization PRA Has patient had a PCN reaction occurring within the last 10 years:PRA If all of the above answers are "NO", then may proceed with Cephalosporin use.      Medication List    TAKE these medications   benztropine 0.5 MG tablet Commonly known as: COGENTIN Take 1 tablet (0.5 mg total) by mouth 2 (two) times daily.   cyanocobalamin 100 MCG tablet Take 1 tablet (100 mcg total) by mouth daily.   ferrous sulfate 325 (65 FE) MG tablet Take 1 tablet (325 mg total) by mouth 2 (two) times daily with a meal.   folic acid 1 MG tablet Commonly known as: FOLVITE Take 1 tablet (1 mg total) by mouth daily.   haloperidol 2 MG tablet Commonly known as: HALDOL Take 1 tablet (2 mg total) by mouth 2 (two) times daily.   valproic acid 250 MG capsule Commonly known as: DEPAKENE Take 2 capsules (500 mg total) by mouth 2 (two) times daily.       Allergies  Allergen Reactions  . Penicillins Rash    Has patient had a PCN  reaction causing immediate rash, facial/tongue/throat swelling, SOB or lightheadedness with hypotension:Patient refuses to answer (PRA) Has patient had a PCN reaction causing severe rash involving mucus membranes or skin necrosis:PRA Has patient had a PCN reaction that required hospitalization PRA Has patient had a PCN reaction occurring within the last 10 years:PRA If all of the above answers are "NO", then may  proceed with Cephalosporin use.     Consultations: Psych   Procedures/Studies:  No results found.   Subjective: Alert, sitting in bed, washing TV. She refuse to speak with me without her lawyer. disorganized thought.  She refuse to take her medications. Staff has not notice further bleed.    Discharge Exam: Vitals:   03/01/19 1344 03/01/19 2103  BP: (!) 142/89 115/72  Pulse: 86 72  Resp: 16 20  SpO2: 100% 99%     PE; decline exam.  She is alert, conversant, psychosis.  Respiratory; normal respiratory effort.     The results of significant diagnostics from this hospitalization (including imaging, microbiology, ancillary and laboratory) are listed below for reference.     Microbiology: Recent Results (from the past 240 hour(s))  SARS Coronavirus 2 (CEPHEID - Performed in [place] hospital lab), Hosp Order     Status: None   Collection Time: 02/27/19  3:47 PM   Specimen: Nasopharyngeal Swab  Result Value Ref Range Status   SARS Coronavirus 2 NEGATIVE NEGATIVE Final    Comment: (NOTE) If result is NEGATIVE SARS-CoV-2 target nucleic acids are NOT DETECTED. The SARS-CoV-2 RNA is generally detectable in upper and lower  respiratory specimens during the acute phase of infection. The lowest  concentration of SARS-CoV-2 viral copies this assay can detect is 250  copies / mL. A negative result does not preclude SARS-CoV-2 infection  and should not be used as the sole basis for treatment or other  patient management decisions.  A negative result may occur with  improper specimen collection / handling, submission of specimen other  than nasopharyngeal swab, presence of viral mutation(s) within the  areas targeted by this assay, and inadequate number of viral copies  (<250 copies / mL). A negative result must be combined with clinical  observations, patient history, and epidemiological information. If result is POSITIVE SARS-CoV-2 target nucleic acids are DETECTED. The  SARS-CoV-2 RNA is generally detectable in upper and lower  respiratory specimens dur ing the acute phase of infection.  Positive  results are indicative of active infection with SARS-CoV-2.  Clinical  correlation with patient history and other diagnostic information is  necessary to determine patient infection status.  Positive results do  not rule out bacterial infection or co-infection with other viruses. If result is PRESUMPTIVE POSTIVE SARS-CoV-2 nucleic acids MAY BE PRESENT.   A presumptive positive result was obtained on the submitted specimen  and confirmed on repeat testing.  While 2019 novel coronavirus  (SARS-CoV-2) nucleic acids may be present in the submitted sample  additional confirmatory testing may be necessary for epidemiological  and / or clinical management purposes  to differentiate between  SARS-CoV-2 and other Sarbecovirus currently known to infect humans.  If clinically indicated additional testing with an alternate test  methodology [phone]) is advised. The SARS-CoV-2 RNA is generally  detectable in upper and lower respiratory sp ecimens during the acute  phase of infection. The expected result is Negative. Fact Sheet for Patients:  StrictlyIdeas.no Fact Sheet for Healthcare Providers: BankingDealers.co.za This test is not yet approved or cleared by the [place] FDA and has been  authorized for detection and/or diagnosis of SARS-CoV-2 by FDA under an Emergency Use Authorization (EUA).  This EUA will remain in effect (meaning this test can be used) for the duration of the COVID-19 declaration under Section 564(b)(1) of the Act, 21 U.S.C. section 360bbb-3(b)(1), unless the authorization is terminated or revoked sooner. Performed at [facility], [place] [address], [place], [place] [address]      Labs: BNP (last 3 results) No results for input(s): BNP in the last 8760 hours. Basic Metabolic  Panel: Recent Labs  Lab 02/27/19 1821 02/28/19 0838  NA 137 137  K 3.7 4.0  CL 103 108  CO2 24 22  GLUCOSE 93 94  BUN 19 17  CREATININE 0.93 0.82  CALCIUM 9.7 9.1   Liver Function Tests: Recent Labs  Lab 02/27/19 1821 02/28/19 0838  AST 15 12*  ALT 9 8  ALKPHOS 49 48  BILITOT 0.6 0.7  PROT 8.5* 7.4  ALBUMIN 4.6 3.9   No results for input(s): LIPASE, AMYLASE in the last 168 hours. No results for input(s): AMMONIA in the last 168 hours. CBC: Recent Labs  Lab 02/27/19 1821 02/28/19 0838 02/28/19 1408 03/01/19 0408  WBC 8.0 4.9  --  6.2  HGB 5.2* 7.3* 7.2* 7.2*  HCT 21.2* 26.3* 25.2* 27.0*  MCV 63.5* 71.7*  --  73.8*  PLT 303 236  --  233   Cardiac Enzymes: No results for input(s): CKTOTAL, CKMB, CKMBINDEX, TROPONINI in the last 168 hours. BNP: Invalid input(s): POCBNP CBG: No results for input(s): GLUCAP in the last 168 hours. D-Dimer No results for input(s): DDIMER in the last 72 hours. Hgb A1c No results for input(s): HGBA1C in the last 72 hours. Lipid Profile No results for input(s): CHOL, HDL, LDLCALC, TRIG, CHOLHDL, LDLDIRECT in the last 72 hours. Thyroid function studies No results for input(s): TSH, T4TOTAL, T3FREE, THYROIDAB in the last 72 hours.  Invalid input(s): FREET3 Anemia work up No results for input(s): VITAMINB12, FOLATE, FERRITIN, TIBC, IRON, RETICCTPCT in the last 72 hours. Urinalysis    Component Value Date/Time   COLORURINE YELLOW 12/06/2011 0034   APPEARANCEUR CLEAR 12/06/2011 0034   LABSPEC 1.021 12/06/2011 0034   PHURINE 6.0 12/06/2011 0034   GLUCOSEU NEGATIVE 12/06/2011 0034   HGBUR LARGE (A) 12/06/2011 0034   BILIRUBINUR NEGATIVE 12/06/2011 0034   KETONESUR NEGATIVE 12/06/2011 0034   PROTEINUR NEGATIVE 12/06/2011 0034   UROBILINOGEN 0.2 12/06/2011 0034   NITRITE NEGATIVE 12/06/2011 0034   LEUKOCYTESUR NEGATIVE 12/06/2011 0034   Sepsis Labs Invalid input(s): PROCALCITONIN,  WBC,  LACTICIDVEN Microbiology Recent  Results (from the past 240 hour(s))  SARS Coronavirus 2 (CEPHEID - Performed in [place] hospital lab), Hosp Order     Status: None   Collection Time: 02/27/19  3:47 PM   Specimen: Nasopharyngeal Swab  Result Value Ref Range Status   SARS Coronavirus 2 NEGATIVE NEGATIVE Final    Comment: (NOTE) If result is NEGATIVE SARS-CoV-2 target nucleic acids are NOT DETECTED. The SARS-CoV-2 RNA is generally detectable in upper and lower  respiratory specimens during the acute phase of infection. The lowest  concentration of SARS-CoV-2 viral copies this assay can detect is 250  copies / mL. A negative result does not preclude SARS-CoV-2 infection  and should not be used as the sole basis for treatment or other  patient management decisions.  A negative result may occur with  improper specimen collection / handling, submission of specimen other  than nasopharyngeal swab, presence of viral mutation(s)  within the  areas targeted by this assay, and inadequate number of viral copies  (<250 copies / mL). A negative result must be combined with clinical  observations, patient history, and epidemiological information. If result is POSITIVE SARS-CoV-2 target nucleic acids are DETECTED. The SARS-CoV-2 RNA is generally detectable in upper and lower  respiratory specimens dur ing the acute phase of infection.  Positive  results are indicative of active infection with SARS-CoV-2.  Clinical  correlation with patient history and other diagnostic information is  necessary to determine patient infection status.  Positive results do  not rule out bacterial infection or co-infection with other viruses. If result is PRESUMPTIVE POSTIVE SARS-CoV-2 nucleic acids MAY BE PRESENT.   A presumptive positive result was obtained on the submitted specimen  and confirmed on repeat testing.  While 2019 novel coronavirus  (SARS-CoV-2) nucleic acids may be present in the submitted sample  additional confirmatory testing may  be necessary for epidemiological  and / or clinical management purposes  to differentiate between  SARS-CoV-2 and other Sarbecovirus currently known to infect humans.  If clinically indicated additional testing with an alternate test  methodology [phone]) is advised. The SARS-CoV-2 RNA is generally  detectable in upper and lower respiratory sp ecimens during the acute  phase of infection. The expected result is Negative. Fact Sheet for Patients:  StrictlyIdeas.no Fact Sheet for Healthcare Providers: BankingDealers.co.za This test is not yet approved or cleared by the [place] FDA and has been authorized for detection and/or diagnosis of SARS-CoV-2 by FDA under an Emergency Use Authorization (EUA).  This EUA will remain in effect (meaning this test can be used) for the duration of the COVID-19 declaration under Section 564(b)(1) of the Act, 21 U.S.C. section 360bbb-3(b)(1), unless the authorization is terminated or revoked sooner. Performed at [facility], [place] [address] [place], [place] [address]      Time coordinating discharge: 40 minutes  SIGNED:   Elmarie Shiley, MD  [facility]

## 2019-03-03 NOTE — Progress Notes (Signed)
Patient refuses to allow RN to assess or provide any type of care.

## 2019-03-04 NOTE — Progress Notes (Signed)
Pt continues to refuse nursing care, meds, and shift assessment. Will continue to monitor. Stacey Drain

## 2019-03-04 NOTE — Progress Notes (Addendum)
PROGRESS NOTE    Valerie Howard  VFI:[id number] DOB: Mar 16, 1976 DOA: 02/28/2019 PCP: Patient, No Pcp Per   Brief Narrative: 43 y.o.femalewithhistory of schizophrenia who was admitted at behavioral health yesterday for psychosis and aggressive behavior was found to have hemoglobin of 5 and was transferred to [facility] long hospital on 7-02 for further management of anemia. Patient does not provide a good history but states that he has some infraumbilical discomfort for last 1 month denies any nausea vomiting or diarrhea and states he has severe menorrhagiafor many months.  She was transfused 2 units overnight with appropriate response.  Hemoglobin 7.3 this morning.  Anemia panel consistent with severe iron deficiency anemia.  Hemodynamically stable.  Transfused 510 mg of Feraheme.    Assessment & Plan:   Principal Problem:   Schizoaffective disorder, bipolar type ([place]) Active Problems:   Anemia, iron deficiency   Menorrhagia   Acute blood loss anemia   Anemia    1-iron deficiency anemia; likely due to abnormal uterine bleed Patient with a hemoglobin of 5.  She has received 2 units of packed red blood cell.  He also has CF fever him. Hemoglobin increased to 7. Patient continued to decline labs drawn. She is stable to be transferred to behavioral health Continue with iron supplement folic acid.  Abnormal uterine bleeding: Need to follow-up with gynecology  Schizophrenia/mood disorder: Pain is still not comparative.  She has not received medication home the last 2 days.  I discussed with nursing staff patient will need to get her medications intramuscular as recommended by psychiatric She is under IVC. Awaiting bed at behavioral health Continue to refuse medications.    Estimated body mass index is 21.48 kg/m as calculated from the following:   Height as of this encounter: 5\' 7"  (1.702 m).   Weight as of this encounter: 62.2 kg.   DVT prophylaxis: scd Code Status:  full code Family Communication: no family at bedside Disposition Plan: Awaiting bed at [facility]  Consultants:   Psych   Procedures:   none  Antimicrobials:  None  Subjective: She didn't want to speak with me, she ask me to leave her room   Objective: Vitals:   03/01/19 0030 03/01/19 0724 03/01/19 1344 03/01/19 2103  BP: (!) 102/58 137/74 (!) 142/89 115/72  Pulse: 66 78 86 72  Resp:  18 16 20   TempSrc:  Oral Oral Oral  SpO2:  100% 100% 99%  Weight:      Height:        Intake/Output Summary (Last 24 hours) at 03/04/2019 1522 Last data filed at 03/04/2019 0101 Gross per 24 hour  Intake 840 ml  Output -  Net 840 ml   Filed Weights   02/28/19 0157  Weight: 62.2 kg    Examination: Refuse to be examined.  General; Not cooperative Respiratory;; normal respiratory effort.       Data Reviewed: I have personally reviewed following labs and imaging studies  CBC: Recent Labs  Lab 02/27/19 1821 02/28/19 0838 02/28/19 1408 03/01/19 0408  WBC 8.0 4.9  --  6.2  HGB 5.2* 7.3* 7.2* 7.2*  HCT 21.2* 26.3* 25.2* 27.0*  MCV 63.5* 71.7*  --  73.8*  PLT 303 236  --  416   Basic Metabolic Panel: Recent Labs  Lab 02/27/19 1821 02/28/19 0838  NA 137 137  K 3.7 4.0  CL 103 108  CO2 24 22  GLUCOSE 93 94  BUN 19 17  CREATININE 0.93 0.82  CALCIUM 9.7 9.1  GFR: Estimated Creatinine Clearance: 85.1 mL/min (by C-G formula based on SCr of 0.82 mg/dL). Liver Function Tests: Recent Labs  Lab 02/27/19 1821 02/28/19 0838  AST 15 12*  ALT 9 8  ALKPHOS 49 48  BILITOT 0.6 0.7  PROT 8.5* 7.4  ALBUMIN 4.6 3.9   No results for input(s): LIPASE, AMYLASE in the last 168 hours. No results for input(s): AMMONIA in the last 168 hours. Coagulation Profile: No results for input(s): INR, PROTIME in the last 168 hours. Cardiac Enzymes: No results for input(s): CKTOTAL, CKMB, CKMBINDEX, TROPONINI in the last 168 hours. BNP (last 3 results) No results for input(s): PROBNP in the  last 8760 hours. HbA1C: No results for input(s): HGBA1C in the last 72 hours. CBG: No results for input(s): GLUCAP in the last 168 hours. Lipid Profile: No results for input(s): CHOL, HDL, LDLCALC, TRIG, CHOLHDL, LDLDIRECT in the last 72 hours. Thyroid Function Tests: No results for input(s): TSH, T4TOTAL, FREET4, T3FREE, THYROIDAB in the last 72 hours. Anemia Panel: No results for input(s): VITAMINB12, FOLATE, FERRITIN, TIBC, IRON, RETICCTPCT in the last 72 hours. Sepsis Labs: No results for input(s): PROCALCITON, LATICACIDVEN in the last 168 hours.  Recent Results (from the past 240 hour(s))  SARS Coronavirus 2 (CEPHEID - Performed in [place] hospital lab), Hosp Order     Status: None   Collection Time: 02/27/19  3:47 PM   Specimen: Nasopharyngeal Swab  Result Value Ref Range Status   SARS Coronavirus 2 NEGATIVE NEGATIVE Final    Comment: (NOTE) If result is NEGATIVE SARS-CoV-2 target nucleic acids are NOT DETECTED. The SARS-CoV-2 RNA is generally detectable in upper and lower  respiratory specimens during the acute phase of infection. The lowest  concentration of SARS-CoV-2 viral copies this assay can detect is 250  copies / mL. A negative result does not preclude SARS-CoV-2 infection  and should not be used as the sole basis for treatment or other  patient management decisions.  A negative result may occur with  improper specimen collection / handling, submission of specimen other  than nasopharyngeal swab, presence of viral mutation(s) within the  areas targeted by this assay, and inadequate number of viral copies  (<250 copies / mL). A negative result must be combined with clinical  observations, patient history, and epidemiological information. If result is POSITIVE SARS-CoV-2 target nucleic acids are DETECTED. The SARS-CoV-2 RNA is generally detectable in upper and lower  respiratory specimens dur ing the acute phase of infection.  Positive  results are indicative  of active infection with SARS-CoV-2.  Clinical  correlation with patient history and other diagnostic information is  necessary to determine patient infection status.  Positive results do  not rule out bacterial infection or co-infection with other viruses. If result is PRESUMPTIVE POSTIVE SARS-CoV-2 nucleic acids MAY BE PRESENT.   A presumptive positive result was obtained on the submitted specimen  and confirmed on repeat testing.  While 2019 novel coronavirus  (SARS-CoV-2) nucleic acids may be present in the submitted sample  additional confirmatory testing may be necessary for epidemiological  and / or clinical management purposes  to differentiate between  SARS-CoV-2 and other Sarbecovirus currently known to infect humans.  If clinically indicated additional testing with an alternate test  methodology [phone]) is advised. The SARS-CoV-2 RNA is generally  detectable in upper and lower respiratory sp ecimens during the acute  phase of infection. The expected result is Negative. Fact Sheet for Patients:  StrictlyIdeas.no Fact Sheet for Healthcare Providers: BankingDealers.co.za This  test is not yet approved or cleared by the [place] and has been authorized for detection and/or diagnosis of SARS-CoV-2 by FDA under an Emergency Use Authorization (EUA).  This EUA will remain in effect (meaning this test can be used) for the duration of the COVID-19 declaration under Section 564(b)(1) of the Act, 21 U.S.C. section 360bbb-3(b)(1), unless the authorization is terminated or revoked sooner. Performed at [facility], [place] [address] [place], [place] [address]          Radiology Studies: No results found.      Scheduled Meds: . benztropine  0.5 mg Oral BID   Or  . benztropine mesylate  0.5 mg Intramuscular BID  . ferrous sulfate  325 mg Oral BID WC  . folic acid  1 mg Oral Daily  . haloperidol  2 mg  Oral BID   Or  . haloperidol lactate  2 mg Intramuscular BID  . valproic acid  500 mg Oral BID  . vitamin B-12  100 mcg Oral Daily   Continuous Infusions:   LOS: 4 days    Time spent: 35 minutes.     Elmarie Shiley, MD [facility] Pager [phone]  If 7PM-7AM, please contact night-coverage [email or web address] Password [email or web address] 03/04/2019, 3:22 PM

## 2019-03-04 NOTE — Progress Notes (Signed)
Pt refused shift assessment and any nursing care this morning. Pt stated to this RN, "I do not need anything from you today. "Davonda Ausley, Laurel Dimmer

## 2019-03-04 NOTE — Progress Notes (Signed)
Pt refusing to allow this RN to assess pt. Pt agreed upon taking PO medications. Medications scanned in and given to pt. Pt dumped water cup into the sink, and chewed PO medications. Pt spit out valproic acid but swallowed her other medications. Pt continues to call this RN "you white bitch." Security and sitter at bedside. Will continue to monitor.

## 2019-03-04 NOTE — Progress Notes (Signed)
Pt was given IM Haldol and IM Benztropine mesylate per MD request. Security, AC, CN present during administration. IVC papers verified by [facility]. Stacey Drain

## 2019-03-04 NOTE — Progress Notes (Signed)
[place] will take pt, however there are no beds today. AC will call when a bed is available.

## 2019-03-04 NOTE — Plan of Care (Signed)
  Problem: Education: Goal: Knowledge of General Education information will improve Description Including pain rating scale, medication(s)/side effects and non-pharmacologic comfort measures Outcome: Progressing   

## 2019-03-05 LAB — CBC
HCT: 32.9 % — ABNORMAL LOW (ref 36.0–46.0)
Hemoglobin: 8.8 g/dL — ABNORMAL LOW (ref 12.0–15.0)
MCH: 21.9 pg — ABNORMAL LOW (ref 26.0–34.0)
MCHC: 26.7 g/dL — ABNORMAL LOW (ref 30.0–36.0)
MCV: 82 fL (ref 80.0–100.0)
Platelets: 328 10*3/uL (ref 150–400)
RBC: 4.01 MIL/uL (ref 3.87–5.11)
RDW: 36.3 % — ABNORMAL HIGH (ref 11.5–15.5)
WBC: 6.9 10*3/uL (ref 4.0–10.5)
nRBC: 0 % (ref 0.0–0.2)

## 2019-03-05 LAB — BASIC METABOLIC PANEL
Anion gap: 9 (ref 5–15)
BUN: 16 mg/dL (ref 6–20)
CO2: 24 mmol/L (ref 22–32)
Calcium: 9.3 mg/dL (ref 8.9–10.3)
Chloride: 107 mmol/L (ref 98–111)
Creatinine, Ser: 0.85 mg/dL (ref 0.44–1.00)
GFR calc Af Amer: >60 mL/min (ref >60)
GFR calc non Af Amer: >60 mL/min (ref >60)
Glucose, Bld: 112 mg/dL — ABNORMAL HIGH (ref 70–99)
Potassium: 4.4 mmol/L (ref 3.5–5.1)
Sodium: 140 mmol/L (ref 135–145)

## 2019-03-05 NOTE — Progress Notes (Signed)
PROGRESS NOTE    Valerie Howard  FUX:[id number] DOB: 11/11/1975 DOA: 02/28/2019 PCP: Patient, No Pcp Per   Brief Narrative: 43 y.o.femalewithhistory of schizophrenia who was admitted at behavioral health yesterday for psychosis and aggressive behavior was found to have hemoglobin of 5 and was transferred to [facility] long hospital on 7-02 for further management of anemia. Patient does not provide a good history but states that he has some infraumbilical discomfort for last 1 month denies any nausea vomiting or diarrhea and states he has severe menorrhagiafor many months.  She was transfused 2 units overnight with appropriate response.  Hemoglobin 7.3 this morning.  Anemia panel consistent with severe iron deficiency anemia.  Hemodynamically stable.  Transfused 510 mg of Feraheme.    Assessment & Plan:   Principal Problem:   Schizoaffective disorder, bipolar type ([place]) Active Problems:   Anemia, iron deficiency   Menorrhagia   Acute blood loss anemia   Anemia    1-iron deficiency anemia; likely due to abnormal uterine bleed Patient with a hemoglobin of 5.  She has received 2 units of packed red blood cell.  He also has CF fever him. Hemoglobin increased to 7. Patient continued to decline labs drawn. She is stable to be transferred to behavioral health Continue with iron supplement folic acid. Hb improved at 8.   Abnormal uterine bleeding: Need to follow-up with gynecology  Schizophrenia/mood disorder: Pain is still not comparative.   I discussed with nursing staff patient will need to get her medications intramuscular as recommended by psychiatric, if patient refuse. Patient is not able to make decision regarding her medications use due to her psychosis. If she doesn't take her medications, her psychosis wont improved.  -patient received medications on 7/6 and today so far.  She is under IVC.  Awaiting bed at behavioral health Continue to refuse medications.  EKG with  stable QT intervale. Follow EKG    Estimated body mass index is 21.48 kg/m as calculated from the following:   Height as of this encounter: 5\' 7"  (1.702 m).   Weight as of this encounter: 62.2 kg.   DVT prophylaxis: scd Code Status: full code Family Communication: no family at bedside Disposition Plan: Awaiting bed at [facility]  Consultants:   Psych   Procedures:   none  Antimicrobials:  None  Subjective: She relates I don't need medications, and don't need BH. You can leave. She then start talking nonsense.   Objective: Vitals:   03/01/19 0030 03/01/19 0724 03/01/19 1344 03/01/19 2103  BP: (!) 102/58 137/74 (!) 142/89 115/72  Pulse: 66 78 86 72  Resp:  18 16 20   TempSrc:  Oral Oral Oral  SpO2:  100% 100% 99%  Weight:      Height:        Intake/Output Summary (Last 24 hours) at 03/05/2019 1416 Last data filed at 03/05/2019 1341 Gross per 24 hour  Intake 840 ml  Output 0 ml  Net 840 ml   Filed Weights   02/28/19 0157  Weight: 62.2 kg    Examination: Refuse to be examined.  General;not cooperative.  Respiratory; Normal respiratory effort.       Data Reviewed: I have personally reviewed following labs and imaging studies  CBC: Recent Labs  Lab 02/27/19 1821 02/28/19 0838 02/28/19 1408 03/01/19 0408 03/05/19 0912  WBC 8.0 4.9  --  6.2 6.9  HGB 5.2* 7.3* 7.2* 7.2* 8.8*  HCT 21.2* 26.3* 25.2* 27.0* 32.9*  MCV 63.5* 71.7*  --  73.8*  82.0  PLT 303 236  --  233 800   Basic Metabolic Panel: Recent Labs  Lab 02/27/19 1821 02/28/19 0838 03/05/19 0912  NA 137 137 140  K 3.7 4.0 4.4  CL 103 108 107  CO2 24 22 24   GLUCOSE 93 94 112*  BUN 19 17 16   CREATININE 0.93 0.82 0.85  CALCIUM 9.7 9.1 9.3   GFR: Estimated Creatinine Clearance: 82.1 mL/min (by C-G formula based on SCr of 0.85 mg/dL). Liver Function Tests: Recent Labs  Lab 02/27/19 1821 02/28/19 0838  AST 15 12*  ALT 9 8  ALKPHOS 49 48  BILITOT 0.6 0.7  PROT 8.5* 7.4  ALBUMIN 4.6 3.9    No results for input(s): LIPASE, AMYLASE in the last 168 hours. No results for input(s): AMMONIA in the last 168 hours. Coagulation Profile: No results for input(s): INR, PROTIME in the last 168 hours. Cardiac Enzymes: No results for input(s): CKTOTAL, CKMB, CKMBINDEX, TROPONINI in the last 168 hours. BNP (last 3 results) No results for input(s): PROBNP in the last 8760 hours. HbA1C: No results for input(s): HGBA1C in the last 72 hours. CBG: No results for input(s): GLUCAP in the last 168 hours. Lipid Profile: No results for input(s): CHOL, HDL, LDLCALC, TRIG, CHOLHDL, LDLDIRECT in the last 72 hours. Thyroid Function Tests: No results for input(s): TSH, T4TOTAL, FREET4, T3FREE, THYROIDAB in the last 72 hours. Anemia Panel: No results for input(s): VITAMINB12, FOLATE, FERRITIN, TIBC, IRON, RETICCTPCT in the last 72 hours. Sepsis Labs: No results for input(s): PROCALCITON, LATICACIDVEN in the last 168 hours.  Recent Results (from the past 240 hour(s))  SARS Coronavirus 2 (CEPHEID - Performed in [place] hospital lab), Hosp Order     Status: None   Collection Time: 02/27/19  3:47 PM   Specimen: Nasopharyngeal Swab  Result Value Ref Range Status   SARS Coronavirus 2 NEGATIVE NEGATIVE Final    Comment: (NOTE) If result is NEGATIVE SARS-CoV-2 target nucleic acids are NOT DETECTED. The SARS-CoV-2 RNA is generally detectable in upper and lower  respiratory specimens during the acute phase of infection. The lowest  concentration of SARS-CoV-2 viral copies this assay can detect is 250  copies / mL. A negative result does not preclude SARS-CoV-2 infection  and should not be used as the sole basis for treatment or other  patient management decisions.  A negative result may occur with  improper specimen collection / handling, submission of specimen other  than nasopharyngeal swab, presence of viral mutation(s) within the  areas targeted by this assay, and inadequate number of viral  copies  (<250 copies / mL). A negative result must be combined with clinical  observations, patient history, and epidemiological information. If result is POSITIVE SARS-CoV-2 target nucleic acids are DETECTED. The SARS-CoV-2 RNA is generally detectable in upper and lower  respiratory specimens dur ing the acute phase of infection.  Positive  results are indicative of active infection with SARS-CoV-2.  Clinical  correlation with patient history and other diagnostic information is  necessary to determine patient infection status.  Positive results do  not rule out bacterial infection or co-infection with other viruses. If result is PRESUMPTIVE POSTIVE SARS-CoV-2 nucleic acids MAY BE PRESENT.   A presumptive positive result was obtained on the submitted specimen  and confirmed on repeat testing.  While 2019 novel coronavirus  (SARS-CoV-2) nucleic acids may be present in the submitted sample  additional confirmatory testing may be necessary for epidemiological  and / or clinical management purposes  to differentiate between  SARS-CoV-2 and other Sarbecovirus currently known to infect humans.  If clinically indicated additional testing with an alternate test  methodology [phone]) is advised. The SARS-CoV-2 RNA is generally  detectable in upper and lower respiratory sp ecimens during the acute  phase of infection. The expected result is Negative. Fact Sheet for Patients:  StrictlyIdeas.no Fact Sheet for Healthcare Providers: BankingDealers.co.za This test is not yet approved or cleared by the [place] FDA and has been authorized for detection and/or diagnosis of SARS-CoV-2 by FDA under an Emergency Use Authorization (EUA).  This EUA will remain in effect (meaning this test can be used) for the duration of the COVID-19 declaration under Section 564(b)(1) of the Act, 21 U.S.C. section 360bbb-3(b)(1), unless the authorization is terminated  or revoked sooner. Performed at [facility], [place] [address], [place], [place] [address]          Radiology Studies: No results found.      Scheduled Meds: . benztropine  0.5 mg Oral BID   Or  . benztropine mesylate  0.5 mg Intramuscular BID  . ferrous sulfate  325 mg Oral BID WC  . folic acid  1 mg Oral Daily  . haloperidol  2 mg Oral BID   Or  . haloperidol lactate  2 mg Intramuscular BID  . valproic acid  500 mg Oral BID  . vitamin B-12  100 mcg Oral Daily   Continuous Infusions:   LOS: 5 days    Time spent: 35 minutes.     Elmarie Shiley, MD [facility] Pager [phone]  If 7PM-7AM, please contact night-coverage [email or web address] Password [email or web address] 03/05/2019, 2:16 PM

## 2019-03-05 NOTE — Progress Notes (Signed)
Dr. Tyrell Antonio notified of EKG results in [place].

## 2019-03-05 NOTE — Progress Notes (Signed)
PT refused assessment from this RN. Pt willing to chew medications tonight. Pt continues to be verbally aggressive with staff. Security and sitter at bedside during med pass. Will continue to monitor.

## 2019-03-05 NOTE — Progress Notes (Signed)
Patient refused assessment from this RN.  Patient did take po Cogentin and Haldol.  Patient chewed up Depakote capsules and spit them out.  Patient refused to take remaining medications after education.
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
## 2019-03-06 ENCOUNTER — Encounter (HOSPITAL_COMMUNITY): Payer: Self-pay

## 2019-03-06 ENCOUNTER — Inpatient Hospital Stay (HOSPITAL_COMMUNITY)
Admission: AD | Admit: 2019-03-06 | Discharge: 2019-03-11 | DRG: 885 | Disposition: A | Discharge status: Home / Self Care | Payer: Medicaid Other | Attending: Psychiatry | Admitting: Psychiatry

## 2019-03-06 ENCOUNTER — Other Ambulatory Visit: Payer: Self-pay | Admitting: Behavioral Health

## 2019-03-06 DIAGNOSIS — F209 Schizophrenia, unspecified: Secondary | ICD-10-CM | POA: Diagnosis present

## 2019-03-06 DIAGNOSIS — Z9119 Patient's noncompliance with other medical treatment and regimen: Secondary | ICD-10-CM | POA: Diagnosis not present

## 2019-03-06 DIAGNOSIS — D62 Acute posthemorrhagic anemia: Secondary | ICD-10-CM | POA: Diagnosis not present

## 2019-03-06 DIAGNOSIS — N921 Excessive and frequent menstruation with irregular cycle: Secondary | ICD-10-CM

## 2019-03-06 DIAGNOSIS — Z79899 Other long term (current) drug therapy: Secondary | ICD-10-CM

## 2019-03-06 DIAGNOSIS — Z923 Personal history of irradiation: Secondary | ICD-10-CM | POA: Diagnosis not present

## 2019-03-06 DIAGNOSIS — F250 Schizoaffective disorder, bipolar type: Secondary | ICD-10-CM | POA: Diagnosis not present

## 2019-03-06 MED ORDER — LORAZEPAM 2 MG/ML IJ SOLN: 2.0000 mg | Freq: Four times a day (QID) | INTRAMUSCULAR | Status: DC | PRN

## 2019-03-06 MED ORDER — HALOPERIDOL 5 MG PO TABS: 5.0000 mg | ORAL_TABLET | Freq: Four times a day (QID) | ORAL | Status: DC | PRN

## 2019-03-06 MED ORDER — DIPHENHYDRAMINE HCL 25 MG PO CAPS: 50.0000 mg | ORAL_CAPSULE | Freq: Every evening | ORAL | Status: DC | PRN

## 2019-03-06 MED ORDER — ALUM & MAG HYDROXIDE-SIMETH 200-200-20 MG/5ML PO SUSP: 30.0000 mL | ORAL | Status: DC | PRN

## 2019-03-06 MED ORDER — HALOPERIDOL LACTATE 5 MG/ML IJ SOLN: 5.0000 mg | Freq: Four times a day (QID) | INTRAMUSCULAR | Status: DC | PRN

## 2019-03-06 MED ORDER — LORAZEPAM 1 MG PO TABS: 2.0000 mg | ORAL_TABLET | Freq: Four times a day (QID) | ORAL | Status: DC | PRN

## 2019-03-06 MED ORDER — ACETAMINOPHEN 325 MG PO TABS: 650.0000 mg | ORAL_TABLET | Freq: Four times a day (QID) | ORAL | Status: DC | PRN

## 2019-03-06 MED ORDER — DIPHENHYDRAMINE HCL 50 MG/ML IJ SOLN: 50.0000 mg | Freq: Four times a day (QID) | INTRAMUSCULAR | Status: DC | PRN

## 2019-03-06 NOTE — TOC Transition Note (Signed)
Transition of Care [facility]) - CM/SW Discharge Note   Patient Details  Name: Valerie Howard MRN: [id number] Date of Birth: April 08, 1976  Transition of Care [facility]) CM/SW Contact:  Nila Nephew, LCSW Phone Number: [phone] 03/06/2019, 2:34 PM   Clinical Narrative:   Pt admitting to [facility] bed 505-2, report [phone]. Report should be called prior to pt transporting. Per [facility], bed will be available for pt 8:30pm tonight, CSW setting up [employer] transport for that time. (Pt under IVC, copies in chart, originals will go with [employer] and pt)      Social Determinants of Health (SDOH) Interventions     Readmission Risk Interventions No flowsheet data found.

## 2019-03-06 NOTE — Progress Notes (Signed)
Report called to [facility] to RN named Legrand Como. Updated oncoming RN on when [employer] will arrive for transport. Will update patient once [employer] arrives. Thanks

## 2019-03-06 NOTE — Progress Notes (Signed)
Pt transferred to [facility] @ 0818 via [facility]. Pt walked to elevator and left willingly w/o incident. Pt asked about a shirt and some black shorts. No bags were found in the locked cabinet on 4th floor [place].

## 2019-03-06 NOTE — Progress Notes (Addendum)
PROGRESS NOTE    Valerie Howard   YBO:[id number]  DOB: 27-Dec-1975  DOA: 02/28/2019 PCP: Patient, No Pcp Per   Brief Narrative:  Valerie Howard 43 y.o.femalewithhistory of schizophrenia who was admitted at behavioral health yesterday for psychosis and aggressive behavior was found to have hemoglobin of 5 and was transferred to [facility] on 7-02 for further management of anemia. Patient does not provide a good history but states that he has some infraumbilical discomfort for last 1 month denies any nausea vomiting or diarrhea and states he has severe menorrhagiafor many months.  She was transfused 2 units overnight with appropriate response. Hemoglobin 7.3 this morning. Anemia panel consistent with severe iron deficiency anemia. Hemodynamically stable. Transfused 510 mg of Feraheme.  Addendum: the patient will be discharged today- please see d/c summary from 03/03/19 dictated by Dr Tyrell Antonio.    Subjective: Cursing and aggressive this  AM. Has told me to leave her room and does not want to talk.     Assessment & Plan:   Principal Problem:   Schizoaffective disorder, bipolar type - awaiting psych inpatient bed - labile mood- refusing medications at times  Active Problems:    Menorrhagia   Acute blood loss anemia - Anemia, iron deficiency  - s/p 2 U PRBC for Hb of 5.2- Hb now 8.8  - anemia panel reveals severe Iron deficiency- given Feraheme on 7/2 - needs f/u with Gyn   Time spent in minutes: 30 DVT prophylaxis: ambulating, SCDs Code Status: Full code Family Communication:  Disposition Plan: awaiting psych facility Consultants:   psych Procedures:     Antimicrobials:  Anti-infectives (From admission, onward)   None       Objective: Vitals:   03/01/19 0030 03/01/19 0724 03/01/19 1344 03/01/19 2103  BP: (!) 102/58 137/74 (!) 142/89 115/72  Pulse: 66 78 86 72  Resp:  18 16 20   TempSrc:  Oral Oral Oral  SpO2:  100% 100% 99%  Weight:       Height:        Intake/Output Summary (Last 24 hours) at 03/06/2019 1306 Last data filed at 03/06/2019 1249 Gross per 24 hour  Intake 1180 ml  Output 0 ml  Net 1180 ml   Filed Weights   02/28/19 0157  Weight: 62.2 kg    Examination: Patient will not allow exam. She is awake, alert and sitting up in bed on the phone.    Data Reviewed: I have personally reviewed following labs and imaging studies  CBC: Recent Labs  Lab 02/27/19 1821 02/28/19 0838 02/28/19 1408 03/01/19 0408 03/05/19 0912  WBC 8.0 4.9  --  6.2 6.9  HGB 5.2* 7.3* 7.2* 7.2* 8.8*  HCT 21.2* 26.3* 25.2* 27.0* 32.9*  MCV 63.5* 71.7*  --  73.8* 82.0  PLT 303 236  --  233 277   Basic Metabolic Panel: Recent Labs  Lab 02/27/19 1821 02/28/19 0838 03/05/19 0912  NA 137 137 140  K 3.7 4.0 4.4  CL 103 108 107  CO2 24 22 24   GLUCOSE 93 94 112*  BUN 19 17 16   CREATININE 0.93 0.82 0.85  CALCIUM 9.7 9.1 9.3   GFR: Estimated Creatinine Clearance: 82.1 mL/min (by C-G formula based on SCr of 0.85 mg/dL). Liver Function Tests: Recent Labs  Lab 02/27/19 1821 02/28/19 0838  AST 15 12*  ALT 9 8  ALKPHOS 49 48  BILITOT 0.6 0.7  PROT 8.5* 7.4  ALBUMIN 4.6 3.9   No results for input(s):  LIPASE, AMYLASE in the last 168 hours. No results for input(s): AMMONIA in the last 168 hours. Coagulation Profile: No results for input(s): INR, PROTIME in the last 168 hours. Cardiac Enzymes: No results for input(s): CKTOTAL, CKMB, CKMBINDEX, TROPONINI in the last 168 hours. BNP (last 3 results) No results for input(s): PROBNP in the last 8760 hours. HbA1C: No results for input(s): HGBA1C in the last 72 hours. CBG: No results for input(s): GLUCAP in the last 168 hours. Lipid Profile: No results for input(s): CHOL, HDL, LDLCALC, TRIG, CHOLHDL, LDLDIRECT in the last 72 hours. Thyroid Function Tests: No results for input(s): TSH, T4TOTAL, FREET4, T3FREE, THYROIDAB in the last 72 hours. Anemia Panel: No results for  input(s): VITAMINB12, FOLATE, FERRITIN, TIBC, IRON, RETICCTPCT in the last 72 hours. Urine analysis:    Component Value Date/Time   COLORURINE YELLOW 12/06/2011 0034   APPEARANCEUR CLEAR 12/06/2011 0034   LABSPEC 1.021 12/06/2011 0034   PHURINE 6.0 12/06/2011 0034   GLUCOSEU NEGATIVE 12/06/2011 0034   HGBUR LARGE (A) 12/06/2011 0034   BILIRUBINUR NEGATIVE 12/06/2011 0034   KETONESUR NEGATIVE 12/06/2011 0034   PROTEINUR NEGATIVE 12/06/2011 0034   UROBILINOGEN 0.2 12/06/2011 0034   NITRITE NEGATIVE 12/06/2011 0034   LEUKOCYTESUR NEGATIVE 12/06/2011 0034   Sepsis Labs: @LABRCNTIP (procalcitonin:4,lacticidven:4) ) Recent Results (from the past 240 hour(s))  SARS Coronavirus 2 (CEPHEID - Performed in [place] hospital lab), Hosp Order     Status: None   Collection Time: 02/27/19  3:47 PM   Specimen: Nasopharyngeal Swab  Result Value Ref Range Status   SARS Coronavirus 2 NEGATIVE NEGATIVE Final    Comment: (NOTE) If result is NEGATIVE SARS-CoV-2 target nucleic acids are NOT DETECTED. The SARS-CoV-2 RNA is generally detectable in upper and lower  respiratory specimens during the acute phase of infection. The lowest  concentration of SARS-CoV-2 viral copies this assay can detect is 250  copies / mL. A negative result does not preclude SARS-CoV-2 infection  and should not be used as the sole basis for treatment or other  patient management decisions.  A negative result may occur with  improper specimen collection / handling, submission of specimen other  than nasopharyngeal swab, presence of viral mutation(s) within the  areas targeted by this assay, and inadequate number of viral copies  (<250 copies / mL). A negative result must be combined with clinical  observations, patient history, and epidemiological information. If result is POSITIVE SARS-CoV-2 target nucleic acids are DETECTED. The SARS-CoV-2 RNA is generally detectable in upper and lower  respiratory specimens dur ing  the acute phase of infection.  Positive  results are indicative of active infection with SARS-CoV-2.  Clinical  correlation with patient history and other diagnostic information is  necessary to determine patient infection status.  Positive results do  not rule out bacterial infection or co-infection with other viruses. If result is PRESUMPTIVE POSTIVE SARS-CoV-2 nucleic acids MAY BE PRESENT.   A presumptive positive result was obtained on the submitted specimen  and confirmed on repeat testing.  While 2019 novel coronavirus  (SARS-CoV-2) nucleic acids may be present in the submitted sample  additional confirmatory testing may be necessary for epidemiological  and / or clinical management purposes  to differentiate between  SARS-CoV-2 and other Sarbecovirus currently known to infect humans.  If clinically indicated additional testing with an alternate test  methodology [phone]) is advised. The SARS-CoV-2 RNA is generally  detectable in upper and lower respiratory sp ecimens during the acute  phase of infection. The  expected result is Negative. Fact Sheet for Patients:  StrictlyIdeas.no Fact Sheet for Healthcare Providers: BankingDealers.co.za This test is not yet approved or cleared by the [place] FDA and has been authorized for detection and/or diagnosis of SARS-CoV-2 by FDA under an Emergency Use Authorization (EUA).  This EUA will remain in effect (meaning this test can be used) for the duration of the COVID-19 declaration under Section 564(b)(1) of the Act, 21 U.S.C. section 360bbb-3(b)(1), unless the authorization is terminated or revoked sooner. Performed at [facility], [place] [address], [place], [place] [address]          Radiology Studies: No results found.    Scheduled Meds: . benztropine  0.5 mg Oral BID   Or  . benztropine mesylate  0.5 mg Intramuscular BID  . ferrous sulfate  325 mg Oral  BID WC  . folic acid  1 mg Oral Daily  . haloperidol  2 mg Oral BID   Or  . haloperidol lactate  2 mg Intramuscular BID  . valproic acid  500 mg Oral BID  . vitamin B-12  100 mcg Oral Daily   Continuous Infusions:   LOS: 6 days      Debbe Odea, MD [facility] Pager: [email or web address] Password [email or web address] 03/06/2019, 1:06 PM

## 2019-03-07 ENCOUNTER — Encounter (HOSPITAL_COMMUNITY): Payer: Self-pay

## 2019-03-07 ENCOUNTER — Other Ambulatory Visit: Payer: Self-pay

## 2019-03-07 DIAGNOSIS — F250 Schizoaffective disorder, bipolar type: Secondary | ICD-10-CM

## 2019-03-07 MED ORDER — HALOPERIDOL 5 MG PO TABS
10.0000 mg | ORAL_TABLET | Freq: Two times a day (BID) | ORAL | Status: DC
  Filled 2019-03-07 (×12): qty 2

## 2019-03-07 MED ORDER — DIPHENHYDRAMINE HCL 50 MG PO CAPS
50.0000 mg | ORAL_CAPSULE | Freq: Two times a day (BID) | ORAL | Status: DC
  Filled 2019-03-07 (×13): qty 1

## 2019-03-07 MED ORDER — TEMAZEPAM 30 MG PO CAPS: 30.0000 mg | ORAL_CAPSULE | Freq: Every day | ORAL | Status: DC

## 2019-03-07 NOTE — Progress Notes (Signed)
The focus of this group is to help patients establish daily goals to achieve during treatment and discuss how the patient can incorporate goal setting into their daily lives to aide in recovery. 

## 2019-03-07 NOTE — Plan of Care (Signed)
D: Patient is alert and uncooperative. Denies SI, HI, AVH. Patient denies physical symptoms/pain. Patient is irritable and labile.    A: Patient refusing medications. Support provided. Routine safety checks every 15 minutes. Patient stated understanding to tell nurse about any new physical symptoms. Patient understands to tell staff of any needs.     R: No adverse drug reactions noted. Patient verbally contracts for safety. Patient remains safe at this time and will continue to monitor.   Problem: Safety: Goal: Periods of time without injury will increase Outcome: Progressing   Patient remains safe and will continue to monitor.    NOVEL CORONAVIRUS (COVID-19) DAILY CHECK-OFF SYMPTOMS - answer yes or no to each - every day NO YES  Have you had a fever in the past 24 hours?  Fever (Temp > 37.80C / 100F) X   Have you had any of these symptoms in the past 24 hours? New Cough  Sore Throat   Shortness of Breath  Difficulty Breathing  Unexplained Body Aches   X   Have you had any one of these symptoms in the past 24 hours not related to allergies?   Runny Nose  Nasal Congestion  Sneezing   X   If you have had runny nose, nasal congestion, sneezing in the past 24 hours, has it worsened?  X   EXPOSURES - check yes or no X   Have you traveled outside the state in the past 14 days?  X   Have you been in contact with someone with a confirmed diagnosis of COVID-19 or PUI in the past 14 days without wearing appropriate PPE?  X   Have you been living in the same home as a person with confirmed diagnosis of COVID-19 or a PUI (household contact)?    X   Have you been diagnosed with COVID-19?    X              What to do next: Answered NO to all: Answered YES to anything:   Proceed with unit schedule Follow the [facility] Inpatient Flowsheet.
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
## 2019-03-07 NOTE — Tx Team (Signed)
Initial Treatment Plan 03/07/2019 2:06 AM Valerie Howard UJW:[id number]    PATIENT STRESSORS: UTA   PATIENT STRENGTHS: UTA   PATIENT IDENTIFIED PROBLEMS: UTA  Pt verbally aggressive and uncooperative with admission   Refused admission assessment interview  Refused to sign paperwork               DISCHARGE CRITERIA:  Improved stabilization in mood, thinking, and/or behavior Need for constant or close observation no longer present Reduction of life-threatening or endangering symptoms to within safe limits  PRELIMINARY DISCHARGE PLAN: Return to previous living arrangement  PATIENT/FAMILY INVOLVEMENT: This treatment plan has been presented to and reviewed with the patient, Valerie Howard.  The patient and family have been given the opportunity to ask questions and make suggestions.  Noel Christmas, RN 03/07/2019, 2:06 AM

## 2019-03-07 NOTE — BHH Group Notes (Signed)
[facility]ace LCSW Group Therapy Note  Date/Time: 03/07/2019 @ 1:15pm  Type of Therapy/Topic:  Group Therapy:  Feelings about Diagnosis  Participation Level:  Did Not Attend   Mood: Did not attend    Description of Group:    This group will allow patients to explore their thoughts and feelings about diagnoses they have received. Patients will be guided to explore their level of understanding and acceptance of these diagnoses. Facilitator will encourage patients to process their thoughts and feelings about the reactions of others to their diagnosis, and will guide patients in identifying ways to discuss their diagnosis with significant others in their lives. This group will be process-oriented, with patients participating in exploration of their own experiences as well as giving and receiving support and challenge from other group members.   Therapeutic Goals: 1. Patient will demonstrate understanding of diagnosis as evidence by identifying two or more symptoms of the disorder:  2. Patient will be able to express two feelings regarding the diagnosis 3. Patient will demonstrate ability to communicate their needs through discussion and/or role plays  Summary of Patient Progress:    Pt did not attend group.     Therapeutic Modalities:   Cognitive Behavioral Therapy Brief Therapy Feelings Identification   Ardelle Anton, LCSW
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
## 2019-03-07 NOTE — Progress Notes (Signed)
Patient has been isolative to her room this shift.  Patient is irritable and agitated this shift.  Patient does not engage Probation officer in 1:1 therapeutic talks.   Assess patient for safety, offer medications as prescribed, engage patient in 1:1 staff talks.   Continue to monitor as planned.

## 2019-03-07 NOTE — [facility] Suicide Risk Assessment (Signed)
[facility] Admission Suicide Risk Assessment   Nursing information obtained from:  Other (Comment)(UTA) Demographic factors:  (UTA) Current Mental Status:  (UTA) Loss Factors:  (UTA) Historical Factors:  (UTA) Risk Reduction Factors:  (UTA)  Total Time spent with patient: 45 minutes Principal Problem: Bipolar exacerbation/schizoaffective Diagnosis:  Active Problems:   Schizophrenia ([place])  Subjective Data: This is the latest of numerous psychiatric admissions and encounters for this 43 year old patient in complete denial of illness and refusing care chronically.  Continued Clinical Symptoms:    The "Alcohol Use Disorders Identification Test", Guidelines for Use in Primary Care, Second Edition.  World Pharmacologist [facility]). Score between 0-7:  no or low risk or alcohol related problems. Score between 8-15:  moderate risk of alcohol related problems. Score between 16-19:  high risk of alcohol related problems. Score 20 or above:  warrants further diagnostic evaluation for alcohol dependence and treatment.   CLINICAL FACTORS: Untreated and chronic schizoaffective disorder bipolar type   Musculoskeletal: Strength & Muscle Tone: within normal limits Gait & Station: normal Patient leans: N/A  Psychiatric Specialty Exam: Physical Exam vital signs stable no involuntary movements  ROS neurological negative for seizures or head trauma endocrine negative for thyroid disease cardiovascular negative GI GU negative  Last menstrual period 02/27/2019.There is no height or weight on file to calculate BMI.  General Appearance: Casual  Eye Contact:  Fair  Speech:  Clear and Coherent  Volume:  Increased  Mood:  Irritable  Affect:  Congruent  Thought Process:  Irrelevant and Descriptions of Associations: Loose  Orientation:  Full (Time, Place, and Person)  Thought Content:  Illogical  Suicidal Thoughts:  No  Homicidal Thoughts:  No  Memory:  Recent;   Fair  Judgement: lacks  Insight:  lacks   Psychomotor Activity:  Normal  Concentration:  Concentration: Fair  Recall:  [employer] of Knowledge:  Fair  Language:  Good  Akathisia:  Negative  Handed:  Right  AIMS (if indicated):     Assets:  Communication Skills Leisure Time Physical Health  ADL's:  Intact  Cognition:  WNL  Sleep:  Number of Hours: 6.75      COGNITIVE FEATURES THAT CONTRIBUTE TO RISK:  Closed-mindedness and Loss of executive function    SUICIDE RISK:   Minimal: No identifiable suicidal ideation.  Patients presenting with no risk factors but with morbid ruminations; may be classified as minimal risk based on the severity of the depressive symptoms  PLAN OF CARE: may need forced meds  I certify that inpatient services furnished can reasonably be expected to improve the patient's condition.   [name], MD 03/07/2019, 12:56 PM

## 2019-03-08 MED ORDER — HALOPERIDOL DECANOATE 100 MG/ML IM SOLN
150.0000 mg | Freq: Once | INTRAMUSCULAR | Status: DC
  Filled 2019-03-08: qty 1.5

## 2019-03-08 NOTE — Plan of Care (Signed)
D: Pt alert and oriented on the unit. Pt engaging with RN staff and other pts. Pt denies SI/HI, A/VH. Pt was irritable this morning and refused medications, stating, "I'm good, I don't need any medicine." A: Education, support and encouragement provided, q15 minute safety checks remain in effect. Medications administered per MD orders. R: No reactions/side effects to medicine noted. Pt denies any concerns at this time, and verbally contracts for safety. Pt ambulating on the unit with no issues. Pt remains safe on and off the unit.   Problem: Activity: Goal: Interest or engagement in activities will improve Outcome: Not Progressing   Problem: Coping: Goal: Ability to demonstrate self-control will improve Outcome: Progressing

## 2019-03-08 NOTE — Progress Notes (Signed)
Patient ID: Valerie Howard, female   DOB: 1975/10/14, 43 y.o.   MRN: [id number]   CSW attempted to call pt's [facility] housing transition coordinator, Cory Munch [phone], about housing coordinator for pt. CSW left voicemail.

## 2019-03-08 NOTE — BHH Suicide Risk Assessment (Signed)
[place] INPATIENT:  Family/Significant Other Suicide Prevention Education  Suicide Prevention Education:  Patient Refusal for Family/Significant Other Suicide Prevention Education: The patient Valerie Howard has refused to provide written consent for family/significant other to be provided Family/Significant Other Suicide Prevention Education during admission and/or prior to discharge.  Physician notified.  Trecia Rogers 03/08/2019, 2:54 PM

## 2019-03-08 NOTE — Progress Notes (Addendum)
Spirituality group facilitated by chaplain Jerene Pitch, MDiv, [facility]   Group Description    Spirituality group focused on topic of "hope."   Pts responded to topic and, engaged in facilitated dialog, expressing their understanding of topic and where they see this in their life.   Group facilitation drew on elements of client-centered, humanist / existential and psycho-dynamic group process.   Pt Progress:   Did not attend.   Pt was in day room prior to group.  Facilitator introduced self and stated that group would be taking place in room.  Pt stated she did not attend group and left room.

## 2019-03-08 NOTE — Progress Notes (Signed)
Patient ID: Valerie Howard, female   DOB: [birth year], 43 y.o.   MRN: [id number]  [place] NOVEL CORONAVIRUS (COVID-19) DAILY CHECK-OFF SYMPTOMS - answer yes or no to each - every day NO YES  Have you had a fever in the past 24 hours?  . Fever (Temp > 37.80C / 100F) X   Have you had any of these symptoms in the past 24 hours? . New Cough .  Sore Throat  .  Shortness of Breath .  Difficulty Breathing .  Unexplained Body Aches   X   Have you had any one of these symptoms in the past 24 hours not related to allergies?   . Runny Nose .  Nasal Congestion .  Sneezing   X   If you have had runny nose, nasal congestion, sneezing in the past 24 hours, has it worsened?  X   EXPOSURES - check yes or no X   Have you traveled outside the state in the past 14 days?  X   Have you been in contact with someone with a confirmed diagnosis of COVID-19 or PUI in the past 14 days without wearing appropriate PPE?  X   Have you been living in the same home as a person with confirmed diagnosis of COVID-19 or a PUI (household contact)?    X   Have you been diagnosed with COVID-19?    X              What to do next: Answered NO to all: Answered YES to anything:   Proceed with unit schedule Follow the [facility] Inpatient Flowsheet.

## 2019-03-08 NOTE — Tx Team (Signed)
Interdisciplinary Treatment and Diagnostic Plan Update  03/08/2019 Time of Session: 09:06am Valerie Howard MRN: [id number]  Principal Diagnosis: <principal problem not specified>  Secondary Diagnoses: Active Problems:   Schizophrenia ([place])   Current Medications:  Current Facility-Administered Medications  Medication Dose Route Frequency Provider Last Rate Last Dose  . acetaminophen (TYLENOL) tablet 650 mg  650 mg Oral Q6H PRN Mordecai Maes, NP      . alum & mag hydroxide-simeth (MAALOX/MYLANTA) 200-200-20 MG/5ML suspension 30 mL  30 mL Oral Q4H PRN Mordecai Maes, NP      . diphenhydrAMINE (BENADRYL) capsule 50 mg  50 mg Oral QHS PRN Mordecai Maes, NP       Or  . diphenhydrAMINE (BENADRYL) injection 50 mg  50 mg Intramuscular Q6H PRN Mordecai Maes, NP      . diphenhydrAMINE (BENADRYL) capsule 50 mg  50 mg Oral BID [name], MD      . haloperidol (HALDOL) tablet 10 mg  10 mg Oral BID [name], MD      . haloperidol (HALDOL) tablet 5 mg  5 mg Oral Q6H PRN Mordecai Maes, NP       Or  . haloperidol lactate (HALDOL) injection 5 mg  5 mg Intramuscular Q6H PRN Mordecai Maes, NP      . haloperidol decanoate (HALDOL DECANOATE) 100 MG/ML injection 150 mg  150 mg Intramuscular Once [name], MD      . LORazepam (ATIVAN) tablet 2 mg  2 mg Oral Q6H PRN Mordecai Maes, NP       Or  . LORazepam (ATIVAN) injection 2 mg  2 mg Intramuscular Q6H PRN Mordecai Maes, NP      . temazepam (RESTORIL) capsule 30 mg  30 mg Oral QHS [name], MD       PTA Medications: Medications Prior to Admission  Medication Sig Dispense Refill Last Dose  . benztropine (COGENTIN) 0.5 MG tablet Take 1 tablet (0.5 mg total) by mouth 2 (two) times daily. 30 tablet 0   . ferrous sulfate 325 (65 FE) MG tablet Take 1 tablet (325 mg total) by mouth 2 (two) times daily with a meal. 60 tablet 3   . folic acid (FOLVITE) 1 MG tablet Take 1 tablet (1 mg total) by mouth daily. 30 tablet 0   .  haloperidol (HALDOL) 2 MG tablet Take 1 tablet (2 mg total) by mouth 2 (two) times daily. 60 tablet 0   . valproic acid (DEPAKENE) 250 MG capsule Take 2 capsules (500 mg total) by mouth 2 (two) times daily. 60 capsule 0   . vitamin B-12 100 MCG tablet Take 1 tablet (100 mcg total) by mouth daily. 30 tablet 0     Patient Stressors:    Patient Strengths:    Treatment Modalities: Medication Management, Group therapy, Case management,  1 to 1 session with clinician, Psychoeducation, Recreational therapy.   Physician Treatment Plan for Primary Diagnosis: <principal problem not specified> Long Term Goal(s): Improvement in symptoms so as ready for discharge Improvement in symptoms so as ready for discharge   Short Term Goals: Ability to verbalize feelings will improve Ability to disclose and discuss suicidal ideas Ability to identify and develop effective coping behaviors will improve Ability to maintain clinical measurements within normal limits will improve Ability to demonstrate self-control will improve Ability to identify and develop effective coping behaviors will improve Ability to maintain clinical measurements within normal limits will improve  Medication Management: Evaluate patient's response, side effects, and tolerance of medication regimen.  Therapeutic Interventions: 1 to 1 sessions, Unit Group sessions and Medication administration.  Evaluation of Outcomes: Progressing  Physician Treatment Plan for Secondary Diagnosis: Active Problems:   Schizophrenia ([place])  Long Term Goal(s): Improvement in symptoms so as ready for discharge Improvement in symptoms so as ready for discharge   Short Term Goals: Ability to verbalize feelings will improve Ability to disclose and discuss suicidal ideas Ability to identify and develop effective coping behaviors will improve Ability to maintain clinical measurements within normal limits will improve Ability to demonstrate self-control  will improve Ability to identify and develop effective coping behaviors will improve Ability to maintain clinical measurements within normal limits will improve     Medication Management: Evaluate patient's response, side effects, and tolerance of medication regimen.  Therapeutic Interventions: 1 to 1 sessions, Unit Group sessions and Medication administration.  Evaluation of Outcomes: Progressing   RN Treatment Plan for Primary Diagnosis: <principal problem not specified> Long Term Goal(s): Knowledge of disease and therapeutic regimen to maintain health will improve  Short Term Goals: Ability to participate in decision making will improve, Ability to verbalize feelings will improve, Ability to disclose and discuss suicidal ideas, Ability to identify and develop effective coping behaviors will improve and Compliance with prescribed medications will improve  Medication Management: RN will administer medications as ordered by provider, will assess and evaluate patient's response and provide education to patient for prescribed medication. RN will report any adverse and/or side effects to prescribing provider.  Therapeutic Interventions: 1 on 1 counseling sessions, Psychoeducation, Medication administration, Evaluate responses to treatment, Monitor vital signs and CBGs as ordered, Perform/monitor CIWA, COWS, AIMS and Fall Risk screenings as ordered, Perform wound care treatments as ordered.  Evaluation of Outcomes: Progressing   LCSW Treatment Plan for Primary Diagnosis: <principal problem not specified> Long Term Goal(s): Safe transition to appropriate next level of care at discharge, Engage patient in therapeutic group addressing interpersonal concerns.  Short Term Goals: Engage patient in aftercare planning with referrals and resources and Increase skills for wellness and recovery  Therapeutic Interventions: Assess for all discharge needs, 1 to 1 time with Social worker, Explore available  resources and support systems, Assess for adequacy in community support network, Educate family and significant other(s) on suicide prevention, Complete Psychosocial Assessment, Interpersonal group therapy.  Evaluation of Outcomes: Progressing   Progress in Treatment: Attending groups: No. Participating in groups: No. Taking medication as prescribed: No. Toleration medication: No. Family/Significant other contact made: No, will contact:  will contact if given consent to contact Patient understands diagnosis: No. Discussing patient identified problems/goals with staff: Yes. Medical problems stabilized or resolved: Yes. Denies suicidal/homicidal ideation: Yes. Issues/concerns per patient self-inventory: No. Other:   New problem(s) identified: No, Describe:  None  New Short Term/Long Term Goal(s): Medication stabilization, elimination of SI thoughts, and development of a comprehensive mental wellness plan.  Patient Goals:  "Find housing"   Discharge Plan or Barriers: CSW will continue to follow up for appropriate referrals and possible discharge planning  Reason for Continuation of Hospitalization: Aggression Medication stabilization  Estimated Length of Stay: 2-3 days  Attendees: Patient: 03/08/2019   Physician: Dr. [name], MD  03/08/2019  Nursing: Elberta Fortis, RN 03/08/2019   RN Care Manager: 03/08/2019   Social Worker: Ardelle Anton, [place] 03/08/2019   Recreational Therapist:  03/08/2019   Other:  03/08/2019   Other:  03/08/2019   Other: 03/08/2019     Scribe for Treatment Team: Trecia Rogers, LCSW 03/08/2019 11:23 AM

## 2019-03-08 NOTE — Progress Notes (Signed)
[facility] MD Progress Note  03/08/2019 9:43 AM Valerie Howard  MRN:  [id number] Subjective:   Patient continues to refuse medications insisting she does not need them she is only here for housing, and paradoxically for our staff to find her housing.  She states she does not have a bipolar condition despite previous diagnoses.  Poorly cooperative in fact completely noncompliant however demanding resources and demanding services I explained her the only way she could be discharged is if she took a long-acting injectable medication and we knew she was safe upon discharge and those 2 things had to be in place and she stated she would get a lawyer and sue [place] to get out and further wanted [place] to find her housing while she was suing [place] Principal Problem: Chronic and untreated schizoaffective type condition with lack of insight and noncompliance chronically/homelessness Diagnosis: Active Problems:   Schizophrenia ([place])  Total Time spent with patient: 20 minutes  Past Medical History:  Past Medical History:  Diagnosis Date  . History of radiation therapy 07/18/17-07/24/17   right ear, operative bed 12 Gy in 3 fractions  . Keloid    right ear  . Paranoid behavior ([place])   . Schizophrenia [facility])     Past Surgical History:  Procedure Laterality Date  . CESAREAN SECTION    . KENALOG INJECTION Bilateral 07/17/2017   Procedure: KENALOG INJECTION;  Surgeon: Wallace Going, DO;  Location: [place];  Service: Plastics;  Laterality: Bilateral;  . MASS EXCISION N/A 07/17/2017   Procedure: EXCISION KELOID OF RIGHT EAR WITH INTROPERATIVE KENALOG INJECTION AND POST OP RADIATION;  Surgeon: Wallace Going, DO;  Location: [place];  Service: Plastics;  Laterality: N/A;  . TUBAL LIGATION     Family History:  Family History  Problem Relation Age of Onset  . Hypertension Other   . Diabetes Other   . Cancer Other   . Keloids Mother   . Breast cancer Maternal Grandmother      Social History:  Social History   Substance and Sexual Activity  Alcohol Use Yes   Comment: socially     Social History   Substance and Sexual Activity  Drug Use No    Social History   Socioeconomic History  . Marital status: Divorced    Spouse name: Not on file  . Number of children: 4  . Years of education: Not on file  . Highest education level: Not on file  Occupational History  . Occupation: unemployed  Social Needs  . Financial resource strain: Not on file  . Food insecurity    Worry: Not on file    Inability: Not on file  . Transportation needs    Medical: Not on file    Non-medical: Not on file  Tobacco Use  . Smoking status: Never Smoker  . Smokeless tobacco: Never Used  Substance and Sexual Activity  . Alcohol use: Yes    Comment: socially  . Drug use: No  . Sexual activity: Not Currently    Birth control/protection: Surgical  Lifestyle  . Physical activity    Days per week: Not on file    Minutes per session: Not on file  . Stress: Not on file  Relationships  . Social Herbalist on phone: Not on file    Gets together: Not on file    Attends religious service: Not on file    Active member of club or organization: Not on file  Attends meetings of clubs or organizations: Not on file    Relationship status: Not on file  Other Topics Concern  . Not on file  Social History Narrative  . Not on file   Additional Social History:                         Sleep: Good  Appetite:  Good  Current Medications: Current Facility-Administered Medications  Medication Dose Route Frequency Provider Last Rate Last Dose  . acetaminophen (TYLENOL) tablet 650 mg  650 mg Oral Q6H PRN Mordecai Maes, NP      . alum & mag hydroxide-simeth (MAALOX/MYLANTA) 200-200-20 MG/5ML suspension 30 mL  30 mL Oral Q4H PRN Mordecai Maes, NP      . diphenhydrAMINE (BENADRYL) capsule 50 mg  50 mg Oral QHS PRN Mordecai Maes, NP       Or  .  diphenhydrAMINE (BENADRYL) injection 50 mg  50 mg Intramuscular Q6H PRN Mordecai Maes, NP      . diphenhydrAMINE (BENADRYL) capsule 50 mg  50 mg Oral BID [name], MD      . haloperidol (HALDOL) tablet 10 mg  10 mg Oral BID [name], MD      . haloperidol (HALDOL) tablet 5 mg  5 mg Oral Q6H PRN Mordecai Maes, NP       Or  . haloperidol lactate (HALDOL) injection 5 mg  5 mg Intramuscular Q6H PRN Mordecai Maes, NP      . haloperidol decanoate (HALDOL DECANOATE) 100 MG/ML injection 150 mg  150 mg Intramuscular Once [name], MD      . LORazepam (ATIVAN) tablet 2 mg  2 mg Oral Q6H PRN Mordecai Maes, NP       Or  . LORazepam (ATIVAN) injection 2 mg  2 mg Intramuscular Q6H PRN Mordecai Maes, NP      . temazepam (RESTORIL) capsule 30 mg  30 mg Oral QHS [name], MD        Lab Results: No results found for this or any previous visit (from the past 48 hour(s)).  Blood Alcohol level:  Lab Results  Component Value Date   ETH <10 02/27/2019   ETH <5 62/37/6283    Metabolic Disorder Labs: Lab Results  Component Value Date   HGBA1C 4.5 (L) 02/27/2019   MPG 82.45 02/27/2019   Lab Results  Component Value Date   PROLACTIN 72.6 (H) 02/27/2019   Lab Results  Component Value Date   CHOL 171 02/27/2019   TRIG 81 02/27/2019   HDL 43 02/27/2019   CHOLHDL 4.0 02/27/2019   VLDL 16 02/27/2019   LDLCALC 112 (H) 02/27/2019    Physical Findings: AIMS: Facial and Oral Movements Muscles of Facial Expression: None, normal Lips and Perioral Area: None, normal Jaw: None, normal Tongue: None, normal,Extremity Movements Upper (arms, wrists, hands, fingers): None, normal Lower (legs, knees, ankles, toes): None, normal, Trunk Movements Neck, shoulders, hips: None, normal, Overall Severity Severity of abnormal movements (highest score from questions above): None, normal Incapacitation due to abnormal movements: None, normal Patient's awareness of abnormal movements (rate  only patient's report): No Awareness, Dental Status Current problems with teeth and/or dentures?: No Does patient usually wear dentures?: No  CIWA:    COWS:     Musculoskeletal: Strength & Muscle Tone: within normal limits Gait & Station: normal Patient leans: N/A  Psychiatric Specialty Exam: Physical Exam  ROS  Blood pressure (!) 110/57, pulse 85, last menstrual period 02/27/2019.There is  no height or weight on file to calculate BMI.  General Appearance: Casual  Eye Contact:  Fair  Speech:  Pressured  Volume:  Increased  Mood:  Angry, Irritable and hypomanic  Affect:  Congruent  Thought Process:  Linear and Descriptions of Associations: Tangential  Orientation:  Full (Time, Place, and Person)  Thought Content:  Illogical  Suicidal Thoughts:  No  Homicidal Thoughts:  No  Memory:  Immediate;   Poor  Judgement:  Impaired  Insight:  Lacking  Psychomotor Activity:  Normal  Concentration:  Concentration: Poor  Recall:  Poor  Fund of Knowledge:  Poor  Language:  Poor  Akathisia:  Negative  Handed:  Right  AIMS (if indicated):     Assets:  Leisure Time Physical Health Resilience  ADL's:  Intact  Cognition:  WNL  Sleep:  Number of Hours: 5     Treatment Plan Summary: Daily contact with patient to assess and evaluate symptoms and progress in treatment and Medication management continue to encourage compliance may need forced medications at some point at this point not behaving dangerously today but will clearly deteriorate if continues to refuse offer long-acting injectable haloperidol and again continue reality-based therapies  Lucile Hillmann, MD 03/08/2019, 9:43 AM
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
## 2019-03-09 DIAGNOSIS — F209 Schizophrenia, unspecified: Principal | ICD-10-CM
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
## 2019-03-09 NOTE — Progress Notes (Signed)
Did not attend group 

## 2019-03-09 NOTE — Progress Notes (Signed)
   03/09/19 0814  COVID-19 Daily Checkoff  If you have had runny nose, nasal congestion, sneezing in the past 24 hours, has it worsened? No  COVID-19 EXPOSURE  Have you traveled outside the state in the past 14 days? No  Have you been in contact with someone with a confirmed diagnosis of COVID-19 or PUI in the past 14 days without wearing appropriate PPE? No  Have you been living in the same home as a person with confirmed diagnosis of COVID-19 or a PUI (household contact)? No  Have you been diagnosed with COVID-19? No

## 2019-03-09 NOTE — BHH Group Notes (Signed)
  BHH/BMU LCSW Group Therapy Note  Date/Time:  03/09/2019 11:15AM-12:00PM  Type of Therapy and Topic:  Group Therapy:  Feelings About Hospitalization  Participation Level:  None  - The patient refused to participate but also would not leave the room  Description of Group This process group involved patients discussing their feelings related to being hospitalized, as well as the benefits they see to being in the hospital.  These feelings and benefits were itemized.  The group then brainstormed specific ways in which they could seek those same benefits when they discharge and return home.  Therapeutic Goals 1. Patient will identify and describe positive and negative feelings related to hospitalization 2. Patient will verbalize benefits of hospitalization to themselves personally 3. Patients will brainstorm together ways they can obtain similar benefits in the outpatient setting, identify barriers to wellness and possible solutions  Summary of Patient Progress:  N/A  Therapeutic Modalities Cognitive Behavioral Therapy Motivational Interviewing    Selmer Dominion, LCSW 03/09/2019, 1:37 PM

## 2019-03-09 NOTE — Progress Notes (Addendum)
[facility] MD Progress Note  03/09/2019 2:37 PM WING GFELLER  MRN:  [id number]   Subjective:   Valerie Howard stated " I do not want to talk to anybody."  Evaluation: Valerie Howard observed sitting in day room.  Attempted to assess with attending psychiatrist and nurse practitioner however patient declined daily assessment.  Patient appears to be irritable and guarded.  Slight agitation noted with questions, as her responses are short and abrupt.  Valerie Howard reported " I only want to discuss housing for me and my children."  Continues to refuse medications per nursing staff.  Reports she is resting well throughout the night.  Patient appears to have poor insight regarding treatment plan.Per treatment plan may consider forced medication options.  We will continue to monitor for safety. Support, encouragement and  reassurance was provided    Principal Problem: Chronic and untreated schizoaffective type condition with lack of insight and noncompliance chronically/homelessness  Diagnosis: Principal Problem:   Schizophrenia (Valerie Howard)  Total Time spent with patient: 20 minutes  Past Medical History:  Past Medical History:  Diagnosis Date  . History of radiation therapy 07/18/17-07/24/17   right ear, operative bed 12 Gy in 3 fractions  . Keloid    right ear  . Paranoid behavior ([place])   . Schizophrenia [facility])     Past Surgical History:  Procedure Laterality Date  . CESAREAN SECTION    . KENALOG INJECTION Bilateral 07/17/2017   Procedure: KENALOG INJECTION;  Surgeon: Wallace Going, DO;  Location: [place];  Service: Plastics;  Laterality: Bilateral;  . MASS EXCISION N/A 07/17/2017   Procedure: EXCISION KELOID OF RIGHT EAR WITH INTROPERATIVE KENALOG INJECTION AND POST OP RADIATION;  Surgeon: Wallace Going, DO;  Location: [place];  Service: Plastics;  Laterality: N/A;  . TUBAL LIGATION     Family History:  Family History  Problem Relation Age of Onset  . Hypertension  Other   . Diabetes Other   . Cancer Other   . Keloids Mother   . Breast cancer Maternal Grandmother     Social History:  Social History   Substance and Sexual Activity  Alcohol Use Yes   Comment: socially     Social History   Substance and Sexual Activity  Drug Use No    Social History   Socioeconomic History  . Marital status: Divorced    Spouse name: Not on file  . Number of children: 4  . Years of education: Not on file  . Highest education level: Not on file  Occupational History  . Occupation: unemployed  Social Needs  . Financial resource strain: Not on file  . Food insecurity    Worry: Not on file    Inability: Not on file  . Transportation needs    Medical: Not on file    Non-medical: Not on file  Tobacco Use  . Smoking status: Never Smoker  . Smokeless tobacco: Never Used  Substance and Sexual Activity  . Alcohol use: Yes    Comment: socially  . Drug use: No  . Sexual activity: Not Currently    Birth control/protection: Surgical  Lifestyle  . Physical activity    Days per week: Not on file    Minutes per session: Not on file  . Stress: Not on file  Relationships  . Social Herbalist on phone: Not on file    Gets together: Not on file    Attends religious service: Not on file  Active member of club or organization: Not on file    Attends meetings of clubs or organizations: Not on file    Relationship status: Not on file  Other Topics Concern  . Not on file  Social History Narrative  . Not on file   Additional Social History:                         Sleep: Good  Appetite:  Good  Current Medications: Current Facility-Administered Medications  Medication Dose Route Frequency Provider Last Rate Last Dose  . acetaminophen (TYLENOL) tablet 650 mg  650 mg Oral Q6H PRN Mordecai Maes, NP      . alum & mag hydroxide-simeth (MAALOX/MYLANTA) 200-200-20 MG/5ML suspension 30 mL  30 mL Oral Q4H PRN Mordecai Maes, NP       . diphenhydrAMINE (BENADRYL) capsule 50 mg  50 mg Oral QHS PRN Mordecai Maes, NP       Or  . diphenhydrAMINE (BENADRYL) injection 50 mg  50 mg Intramuscular Q6H PRN Mordecai Maes, NP      . diphenhydrAMINE (BENADRYL) capsule 50 mg  50 mg Oral BID [name], MD      . haloperidol (HALDOL) tablet 10 mg  10 mg Oral BID [name], MD      . haloperidol (HALDOL) tablet 5 mg  5 mg Oral Q6H PRN Mordecai Maes, NP       Or  . haloperidol lactate (HALDOL) injection 5 mg  5 mg Intramuscular Q6H PRN Mordecai Maes, NP      . haloperidol decanoate (HALDOL DECANOATE) 100 MG/ML injection 150 mg  150 mg Intramuscular Once [name], MD      . LORazepam (ATIVAN) tablet 2 mg  2 mg Oral Q6H PRN Mordecai Maes, NP       Or  . LORazepam (ATIVAN) injection 2 mg  2 mg Intramuscular Q6H PRN Mordecai Maes, NP      . temazepam (RESTORIL) capsule 30 mg  30 mg Oral QHS [name], MD        Lab Results: No results found for this or any previous visit (from the past 48 hour(s)).  Blood Alcohol level:  Lab Results  Component Value Date   ETH <10 02/27/2019   ETH <5 58/04/9832    Metabolic Disorder Labs: Lab Results  Component Value Date   HGBA1C 4.5 (L) 02/27/2019   MPG 82.45 02/27/2019   Lab Results  Component Value Date   PROLACTIN 72.6 (H) 02/27/2019   Lab Results  Component Value Date   CHOL 171 02/27/2019   TRIG 81 02/27/2019   HDL 43 02/27/2019   CHOLHDL 4.0 02/27/2019   VLDL 16 02/27/2019   LDLCALC 112 (H) 02/27/2019    Physical Findings: AIMS: Facial and Oral Movements Muscles of Facial Expression: None, normal Lips and Perioral Area: None, normal Jaw: None, normal Tongue: None, normal,Extremity Movements Upper (arms, wrists, hands, fingers): None, normal Lower (legs, knees, ankles, toes): None, normal, Trunk Movements Neck, shoulders, hips: None, normal, Overall Severity Severity of abnormal movements (highest score from questions above): None,  normal Incapacitation due to abnormal movements: None, normal Patient's awareness of abnormal movements (rate only patient's report): No Awareness, Dental Status Current problems with teeth and/or dentures?: No Does patient usually wear dentures?: No  CIWA:  CIWA-Ar Total: 0 COWS:  COWS Total Score: 0  Musculoskeletal: Strength & Muscle Tone: within normal limits Gait & Station: normal Patient leans: N/A  Psychiatric Specialty Exam: Physical  Exam  ROS  Blood pressure (!) 110/57, pulse 85, last menstrual period 02/27/2019.There is no height or weight on file to calculate BMI.  General Appearance: Casual  Eye Contact:  Fair  Speech:  Pressured  Volume:  Increased  Mood:  Angry, Irritable and hypomanic  Affect:  Congruent  Thought Process:  Linear and Descriptions of Associations: Tangential  Orientation:  Full (Time, Place, and Person)  Thought Content:  Illogical  Suicidal Thoughts:  No  Homicidal Thoughts:  No  Memory:  Immediate;   Poor  Judgement:  Impaired  Insight:  Lacking  Psychomotor Activity:  Normal  Concentration:  Concentration: Poor  Recall:  Poor  Fund of Knowledge:  Poor  Language:  Poor  Akathisia:  Negative  Handed:  Right  AIMS (if indicated):     Assets:  Leisure Time Physical Health Resilience  ADL's:  Intact  Cognition:  WNL  Sleep:  Number of Hours: 6.5     Treatment Plan Summary: Daily contact with patient to assess and evaluate symptoms and progress in treatment and Medication management   Continue her current treatment plan on 03/09/2019 as listed below  Schizophrenia  Continue Haldol 10 mg p.o. twice daily See chart for agitation protocol Continue Restoril 30 mg p.o. nightly -Haldol Decanoate 150 mg pending  CSW to continue working on discharge disposition Patient encouraged to participate within the therapeutic milieu  Derrill [place], NP 03/09/2019, 2:37 PM Attest to NP Progress Note

## 2019-03-09 NOTE — Progress Notes (Signed)
D:  Valerie Howard was in the day room much of the evening.  She was initially irritable and defensive on initial approach talking about how "I don't need anything, I'm fine."  After that she was pleasant and thanked staff for various things.  She denied SI/HI or A/V hallucinations.  She continued to decline taking medications.  She is currently resting quietly with her eyes closed and appears to be asleep. A:  1:1 with RN for support and encouragement.  Offered medications as ordered.  Q 15 minute checks maintained for safety.  Encouraged participation in group and unit activities.   R:  Valerie Howard remains safe on the unit.  We will continue to monitor the progress towards her goals.

## 2019-03-10 NOTE — Progress Notes (Signed)
D: Patient refused evening dose of haldol. "I am good, I don't needs meds."

## 2019-03-10 NOTE — [facility] Group Notes (Signed)
[place] Group Notes: (Clinical Social Work)   03/10/2019      Type of Therapy:  Group Therapy   Participation Level:  Did Not Attend - was invited both individually by MHT and by overhead announcement, chose not to attend.  Was in the group room when group started but acted angry and left when CSW came into room.   Selmer Dominion, LCSW 03/10/2019, 11:33 AM

## 2019-03-10 NOTE — Progress Notes (Signed)
D: " I don't need anything from you". Pt irritable , will not talk to writer , only to yell/ argue about what she is not going to take and how she does not want to talk to Probation officer.  A:   Q 15 minute checks were done for safety.  R: safety maintained on unit.

## 2019-03-10 NOTE — Progress Notes (Signed)
D: Patient is labile. She is defensive when asked if she wants to take her medications. She refuses all medications. "I just need placement, but I don't want any government help. Doesn't social work only refer you to [facility]?" She does not answer any other assessment questions. She does not appear to be responding to internal stimuli. Her speech is organized and she is asking appropriate questions. She is intrusive at time in other patients care, frequently asking them if they need help from her.  A: Offered fluids. Patient refused meds. "I don't need medications".  R: Patient spending most of day in dayroom interacting with staff and peers.

## 2019-03-10 NOTE — Progress Notes (Addendum)
[facility] MD Progress Note  03/10/2019 9:41 AM Valerie Howard  MRN:  [id number]   Evaluation: Valerie Howard observed sitting in day room.  Patient continues to express that she is not interested in speaking with this NP. [place] refuse daily assessment and scheduled medications per nursing staff.  Patient is irritable and labile. Will attempt to reassess later. Support, encouragement and reassurances was provided.     Principal Problem: Chronic and untreated schizoaffective type condition with lack of insight and noncompliance chronically/homelessness  Diagnosis: Principal Problem:   Schizophrenia ([place])  Total Time spent with patient: 20 minutes  Past Medical History:  Past Medical History:  Diagnosis Date  . History of radiation therapy 07/18/17-07/24/17   right ear, operative bed 12 Gy in 3 fractions  . Keloid    right ear  . Paranoid behavior ([place])   . Schizophrenia [facility])     Past Surgical History:  Procedure Laterality Date  . CESAREAN SECTION    . KENALOG INJECTION Bilateral 07/17/2017   Procedure: KENALOG INJECTION;  Surgeon: Wallace Going, DO;  Location: [place];  Service: Plastics;  Laterality: Bilateral;  . MASS EXCISION N/A 07/17/2017   Procedure: EXCISION KELOID OF RIGHT EAR WITH INTROPERATIVE KENALOG INJECTION AND POST OP RADIATION;  Surgeon: Wallace Going, DO;  Location: [place];  Service: Plastics;  Laterality: N/A;  . TUBAL LIGATION     Family History:  Family History  Problem Relation Age of Onset  . Hypertension Other   . Diabetes Other   . Cancer Other   . Keloids Mother   . Breast cancer Maternal Grandmother     Social History:  Social History   Substance and Sexual Activity  Alcohol Use Yes   Comment: socially     Social History   Substance and Sexual Activity  Drug Use No    Social History   Socioeconomic History  . Marital status: Divorced    Spouse name: Not on file  . Number of children: 4  .  Years of education: Not on file  . Highest education level: Not on file  Occupational History  . Occupation: unemployed  Social Needs  . Financial resource strain: Not on file  . Food insecurity    Worry: Not on file    Inability: Not on file  . Transportation needs    Medical: Not on file    Non-medical: Not on file  Tobacco Use  . Smoking status: Never Smoker  . Smokeless tobacco: Never Used  Substance and Sexual Activity  . Alcohol use: Yes    Comment: socially  . Drug use: No  . Sexual activity: Not Currently    Birth control/protection: Surgical  Lifestyle  . Physical activity    Days per week: Not on file    Minutes per session: Not on file  . Stress: Not on file  Relationships  . Social Herbalist on phone: Not on file    Gets together: Not on file    Attends religious service: Not on file    Active member of club or organization: Not on file    Attends meetings of clubs or organizations: Not on file    Relationship status: Not on file  Other Topics Concern  . Not on file  Social History Narrative  . Not on file   Additional Social History:  Sleep: Good  Appetite:  Good  Current Medications: Current Facility-Administered Medications  Medication Dose Route Frequency Provider Last Rate Last Dose  . acetaminophen (TYLENOL) tablet 650 mg  650 mg Oral Q6H PRN Mordecai Maes, NP      . alum & mag hydroxide-simeth (MAALOX/MYLANTA) 200-200-20 MG/5ML suspension 30 mL  30 mL Oral Q4H PRN Mordecai Maes, NP      . diphenhydrAMINE (BENADRYL) capsule 50 mg  50 mg Oral QHS PRN Mordecai Maes, NP       Or  . diphenhydrAMINE (BENADRYL) injection 50 mg  50 mg Intramuscular Q6H PRN Mordecai Maes, NP      . diphenhydrAMINE (BENADRYL) capsule 50 mg  50 mg Oral BID [name], MD      . haloperidol (HALDOL) tablet 10 mg  10 mg Oral BID [name], MD      . haloperidol (HALDOL) tablet 5 mg  5 mg Oral Q6H PRN Mordecai Maes, NP       Or  . haloperidol lactate (HALDOL) injection 5 mg  5 mg Intramuscular Q6H PRN Mordecai Maes, NP      . haloperidol decanoate (HALDOL DECANOATE) 100 MG/ML injection 150 mg  150 mg Intramuscular Once [name], MD      . LORazepam (ATIVAN) tablet 2 mg  2 mg Oral Q6H PRN Mordecai Maes, NP       Or  . LORazepam (ATIVAN) injection 2 mg  2 mg Intramuscular Q6H PRN Mordecai Maes, NP      . temazepam (RESTORIL) capsule 30 mg  30 mg Oral QHS [name], MD        Lab Results: No results found for this or any previous visit (from the past 48 hour(s)).  Blood Alcohol level:  Lab Results  Component Value Date   ETH <10 02/27/2019   ETH <5 97/09/6376    Metabolic Disorder Labs: Lab Results  Component Value Date   HGBA1C 4.5 (L) 02/27/2019   MPG 82.45 02/27/2019   Lab Results  Component Value Date   PROLACTIN 72.6 (H) 02/27/2019   Lab Results  Component Value Date   CHOL 171 02/27/2019   TRIG 81 02/27/2019   HDL 43 02/27/2019   CHOLHDL 4.0 02/27/2019   VLDL 16 02/27/2019   LDLCALC 112 (H) 02/27/2019    Physical Findings: AIMS: Facial and Oral Movements Muscles of Facial Expression: None, normal Lips and Perioral Area: None, normal Jaw: None, normal Tongue: None, normal,Extremity Movements Upper (arms, wrists, hands, fingers): None, normal Lower (legs, knees, ankles, toes): None, normal, Trunk Movements Neck, shoulders, hips: None, normal, Overall Severity Severity of abnormal movements (highest score from questions above): None, normal Incapacitation due to abnormal movements: None, normal Patient's awareness of abnormal movements (rate only patient's report): No Awareness, Dental Status Current problems with teeth and/or dentures?: No Does patient usually wear dentures?: No  CIWA:  CIWA-Ar Total: 0 COWS:  COWS Total Score: 0  Musculoskeletal: Strength & Muscle Tone: within normal limits Gait & Station: normal Patient leans:  N/A  Psychiatric Specialty Exam: Physical Exam  Constitutional: She appears well-developed.    Review of Systems  All other systems reviewed and are negative.   Blood pressure (!) 110/57, pulse 85, last menstrual period 02/27/2019.There is no height or weight on file to calculate BMI.  General Appearance: Casual  Eye Contact:  Fair  Speech:  Pressured  Volume:  Increased  Mood:  Angry, Irritable and hypomanic  Affect:  Congruent  Thought Process:  Linear and  Descriptions of Associations: Tangential  Orientation:  Full (Time, Place, and Person)  Thought Content:  Illogical  Suicidal Thoughts:  No  Homicidal Thoughts:  No  Memory:  Immediate;   Poor  Judgement:  Impaired  Insight:  Lacking  Psychomotor Activity:  Normal  Concentration:  Concentration: Poor  Recall:  Poor  Fund of Knowledge:  Poor  Language:  Poor  Akathisia:  Negative  Handed:  Right  AIMS (if indicated):     Assets:  Leisure Time Physical Health Resilience  ADL's:  Intact  Cognition:  WNL  Sleep:  Number of Hours: 6.75     Treatment Plan Summary: Daily contact with patient to assess and evaluate symptoms and progress in treatment and Medication management   Continue her current treatment plan on 03/10/2019 as listed below  Schizophrenia  Continue Haldol 10 mg p.o. twice daily See chart for agitation protocol Continue Restoril 30 mg p.o. nightly -Haldol Decanoate 150 mg pending  CSW to continue working on discharge disposition Patient encouraged to participate within the therapeutic milieu  Derrill Center, NP 03/10/2019, 9:41 AM Attest to NP Progress Note

## 2019-03-10 NOTE — Progress Notes (Signed)
   03/10/19 0841  COVID-19 Daily Checkoff  Have you had a fever (temp > 37.80C/100F)  in the past 24 hours?  No  If you have had runny nose, nasal congestion, sneezing in the past 24 hours, has it worsened? No  COVID-19 EXPOSURE  Have you traveled outside the state in the past 14 days? No  Have you been in contact with someone with a confirmed diagnosis of COVID-19 or PUI in the past 14 days without wearing appropriate PPE? No  Have you been living in the same home as a person with confirmed diagnosis of COVID-19 or a PUI (household contact)? No  Have you been diagnosed with COVID-19? No

## 2019-03-10 NOTE — Progress Notes (Signed)
Pt refused all her medications stating she does not know why she is here because all she need is housing not medications. Pt is irritable and rude. Maintained on routine checks, support and encouragement offered as needed, will continue to monitor.

## 2019-03-11 MED ORDER — BENZTROPINE MESYLATE 1 MG PO TABS: 1.0000 mg | ORAL_TABLET | Freq: Every day | ORAL | 2 refills | Status: DC

## 2019-03-11 MED ORDER — HALOPERIDOL 10 MG PO TABS: 10.0000 mg | ORAL_TABLET | Freq: Two times a day (BID) | ORAL | 2 refills | Status: DC

## 2019-03-11 NOTE — Progress Notes (Signed)
Patient ID: Valerie Howard, female   DOB: November 20, 1975, 43 y.o.   MRN: [id number]   Patient asked to speak with CSW from the lobby. CSW called into the lobby. Pt stated that she needed housing and that it was the social workers job to provide that housing. CSW informed the patient that CSW gave pt multiple resources for housing. Patient stated that social worker did not do her job. Patient asked if social worker called her housing development. CSW stated that the pt talked to her [employer] coordinator and gave that information about her housing to the patient on Friday at [place] asked if CSW could put the patient in a lyft. Pt became agitated and continued to argue with CSW before stating she doesn't need a lyft. Patient hung up on the CSW.

## 2019-03-11 NOTE — Progress Notes (Signed)
Pt denies SI/HI and AVH. Pt stated, "I don't need to be here, there's nothing wrong with me. I'm here because I need housing." Pt easily agitated and refused to comply with discharge planning. Pt was safely discharged from the 500 hall and escorted (restraint free) to the lobby by staff. Pt refused to accept the discharge paperwork that was provided to her by Probation officer.The MHT's on the unit provided the pt with tennis shoes at the time of d/c and the pt refused, stating "I don't want this shit."

## 2019-03-11 NOTE — Progress Notes (Signed)
  [facility] Adult Case Management Discharge Plan :  Will you be returning to the same living situation after discharge:  No.; patient does not have her housing anymore. CSW provided multiple housing resources that patient continued to deny.  At discharge, do you have transportation home?: No.; CSW offered a lyft for the patient but she denied.  Do you have the ability to pay for your medications: Yes,  medicaid  Release of information consent forms completed and in the chart;  Patient's signature needed at discharge.  Patient to Follow up at: Follow-up Information    Pt denies all aftercare Follow up.           Next level of care provider has access to [place] and Suicide Prevention discussed: No.; pt denied/refused.   Have you used any form of tobacco in the last 30 days? (Cigarettes, Smokeless Tobacco, Cigars, and/or Pipes): Patient Refused Screening  Has patient been referred to the Quitline?: Patient refused referral  Patient has been referred for addiction treatment: Pt. refused referral  Trecia Rogers, LCSW 03/11/2019, 11:41 AM
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
## 2019-03-11 NOTE — Tx Team (Signed)
Interdisciplinary Treatment and Diagnostic Plan Update  03/11/2019 Time of Session: 09:12am Valerie Howard MRN: [id number]  Principal Diagnosis: Schizophrenia [facility])  Secondary Diagnoses: Principal Problem:   Schizophrenia ([name])   Current Medications:  Current Facility-Administered Medications  Medication Dose Route Frequency Provider Last Rate Last Dose  . acetaminophen (TYLENOL) tablet 650 mg  650 mg Oral Q6H PRN Mordecai Maes, NP      . alum & mag hydroxide-simeth (MAALOX/MYLANTA) 200-200-20 MG/5ML suspension 30 mL  30 mL Oral Q4H PRN Mordecai Maes, NP      . diphenhydrAMINE (BENADRYL) capsule 50 mg  50 mg Oral QHS PRN Mordecai Maes, NP       Or  . diphenhydrAMINE (BENADRYL) injection 50 mg  50 mg Intramuscular Q6H PRN Mordecai Maes, NP      . diphenhydrAMINE (BENADRYL) capsule 50 mg  50 mg Oral BID [name], MD      . haloperidol (HALDOL) tablet 10 mg  10 mg Oral BID [name], MD      . haloperidol (HALDOL) tablet 5 mg  5 mg Oral Q6H PRN Mordecai Maes, NP       Or  . haloperidol lactate (HALDOL) injection 5 mg  5 mg Intramuscular Q6H PRN Mordecai Maes, NP      . haloperidol decanoate (HALDOL DECANOATE) 100 MG/ML injection 150 mg  150 mg Intramuscular Once [name], MD      . LORazepam (ATIVAN) tablet 2 mg  2 mg Oral Q6H PRN Mordecai Maes, NP       Or  . LORazepam (ATIVAN) injection 2 mg  2 mg Intramuscular Q6H PRN Mordecai Maes, NP      . temazepam (RESTORIL) capsule 30 mg  30 mg Oral QHS [name], MD       PTA Medications: Medications Prior to Admission  Medication Sig Dispense Refill Last Dose  . benztropine (COGENTIN) 0.5 MG tablet Take 1 tablet (0.5 mg total) by mouth 2 (two) times daily. 30 tablet 0   . ferrous sulfate 325 (65 FE) MG tablet Take 1 tablet (325 mg total) by mouth 2 (two) times daily with a meal. 60 tablet 3   . folic acid (FOLVITE) 1 MG tablet Take 1 tablet (1 mg total) by mouth daily. 30 tablet 0   . haloperidol  (HALDOL) 2 MG tablet Take 1 tablet (2 mg total) by mouth 2 (two) times daily. 60 tablet 0   . valproic acid (DEPAKENE) 250 MG capsule Take 2 capsules (500 mg total) by mouth 2 (two) times daily. 60 capsule 0   . vitamin B-12 100 MCG tablet Take 1 tablet (100 mcg total) by mouth daily. 30 tablet 0     Patient Stressors:    Patient Strengths:    Treatment Modalities: Medication Management, Group therapy, Case management,  1 to 1 session with clinician, Psychoeducation, Recreational therapy.   Physician Treatment Plan for Primary Diagnosis: Schizophrenia ([name]) Long Term Goal(s): Improvement in symptoms so as ready for discharge Improvement in symptoms so as ready for discharge   Short Term Goals: Ability to verbalize feelings will improve Ability to disclose and discuss suicidal ideas Ability to identify and develop effective coping behaviors will improve Ability to maintain clinical measurements within normal limits will improve Ability to demonstrate self-control will improve Ability to identify and develop effective coping behaviors will improve Ability to maintain clinical measurements within normal limits will improve  Medication Management: Evaluate patient's response, side effects, and tolerance of medication regimen.  Therapeutic Interventions: 1  to 1 sessions, Unit Group sessions and Medication administration.  Evaluation of Outcomes: Adequate for Discharge  Physician Treatment Plan for Secondary Diagnosis: Principal Problem:   Schizophrenia ([name])  Long Term Goal(s): Improvement in symptoms so as ready for discharge Improvement in symptoms so as ready for discharge   Short Term Goals: Ability to verbalize feelings will improve Ability to disclose and discuss suicidal ideas Ability to identify and develop effective coping behaviors will improve Ability to maintain clinical measurements within normal limits will improve Ability to demonstrate self-control will  improve Ability to identify and develop effective coping behaviors will improve Ability to maintain clinical measurements within normal limits will improve     Medication Management: Evaluate patient's response, side effects, and tolerance of medication regimen.  Therapeutic Interventions: 1 to 1 sessions, Unit Group sessions and Medication administration.  Evaluation of Outcomes: Adequate for Discharge   RN Treatment Plan for Primary Diagnosis: Schizophrenia ([name]) Long Term Goal(s): Knowledge of disease and therapeutic regimen to maintain health will improve  Short Term Goals: Ability to participate in decision making will improve, Ability to verbalize feelings will improve, Ability to disclose and discuss suicidal ideas, Ability to identify and develop effective coping behaviors will improve and Compliance with prescribed medications will improve  Medication Management: RN will administer medications as ordered by provider, will assess and evaluate patient's response and provide education to patient for prescribed medication. RN will report any adverse and/or side effects to prescribing provider.  Therapeutic Interventions: 1 on 1 counseling sessions, Psychoeducation, Medication administration, Evaluate responses to treatment, Monitor vital signs and CBGs as ordered, Perform/monitor CIWA, COWS, AIMS and Fall Risk screenings as ordered, Perform wound care treatments as ordered.  Evaluation of Outcomes: Adequate for Discharge   LCSW Treatment Plan for Primary Diagnosis: Schizophrenia [name]) Long Term Goal(s): Safe transition to appropriate next level of care at discharge, Engage patient in therapeutic group addressing interpersonal concerns.  Short Term Goals: Engage patient in aftercare planning with referrals and resources and Increase skills for wellness and recovery  Therapeutic Interventions: Assess for all discharge needs, 1 to 1 time with Social worker, Explore available resources  and support systems, Assess for adequacy in community support network, Educate family and significant other(s) on suicide prevention, Complete Psychosocial Assessment, Interpersonal group therapy.  Evaluation of Outcomes: Adequate for Discharge   Progress in Treatment: Attending groups: No. Participating in groups: No. Taking medication as prescribed: No. Toleration medication: No. Family/Significant other contact made: Yes, individual(s) contacted:  with pt; pt denied spe Patient understands diagnosis: No. Discussing patient identified problems/goals with staff: Yes. Medical problems stabilized or resolved: Yes. Denies suicidal/homicidal ideation: Yes. Issues/concerns per patient self-inventory: No. Other:   New problem(s) identified: No, Describe:  None  New Short Term/Long Term Goal(s): Medication stabilization, elimination of SI thoughts, and development of a comprehensive mental wellness plan.   Patient Goals:  "find housing"  Discharge Plan or Barriers: Pt refuses aftercare. Pt does not have housing. Pt was given resources but is refusing the housing options.   Reason for Continuation of Hospitalization: Patient is discharging today.   Estimated Length of Stay: Patient is discharging today.   Attendees: Patient: 03/11/2019   Physician: Dr. [name], MD 03/11/2019  Nursing: Sharl Ma, RN  03/11/2019   RN Care Manager: 03/11/2019  Social Worker: Ardelle Anton, LCSW 03/11/2019   Recreational Therapist:  03/11/2019   Other:  03/11/2019   Other:  03/11/2019   Other: 03/11/2019     Scribe for Treatment Team: Benjaman Pott  Rosana Hoes, [place] 03/11/2019 10:09 AM

## 2019-03-11 NOTE — Progress Notes (Signed)
Recreation Therapy Notes  Date: 7.13.20 Time: 1013 Location: 500 [address] Dayroom  Group Topic: Coping Skills  Goal Area(s) Addresses:  Patient will identify positive coping skills. Patient will identify benefits of using coping skills post d/c.  Intervention:  [employer], dry erase marker, worksheet  Activity:  Mind Map.  Patients and LRT filled out first 8 boxes together with anger, substance abuse, being sad, anxiety, verbal abuse, torture and finances.  Patients will then come up with at least three coping skills per situation.  LRT will then write the coping skills on the board so patience can fill in the blank spots on their worksheets.  Education: Radiographer, therapeutic, Dentist.   Education Outcome: Acknowledges understanding/In group clarification offered/Needs additional education.   Clinical Observations/Feedback:  Pt did not attend group.    Victorino Sparrow, LRT/CTRS         Ria Comment, Beonca Gibb A 03/11/2019 11:07 AM

## 2019-03-11 NOTE — BHH Suicide Risk Assessment (Signed)
[facility] Discharge Suicide Risk Assessment   Principal Problem: Schizophrenia [facility]) Discharge Diagnoses: Principal Problem:   Schizophrenia ([place])   Total Time spent with patient: 45 minutes  Musculoskeletal: Strength & Muscle Tone: within normal limits Gait & Station: normal Patient leans: N/A  Psychiatric Specialty Exam: ROS  Blood pressure (!) 110/57, pulse 85, last menstrual period 02/27/2019.There is no height or weight on file to calculate BMI.  General Appearance: Casual  Eye Contact::  Good  Speech:  Clear and Coherent409  Volume:  Normal  Mood:  Irritable  Affect:  Constricted  Thought Process:  Irrelevant and Descriptions of Associations: Tangential  Orientation:  Full (Time, Place, and Person)  Thought Content:  Illogical  Suicidal Thoughts:  No  Homicidal Thoughts:  No  Memory:  Recent;   Fair  Judgement:  Impaired  Insight:  Lacking  Psychomotor Activity:  Normal  Concentration:  Fair  Recall:  [employer] of Knowledge:Fair  Language: Fair  Akathisia:  Negative  Handed:  Right  AIMS (if indicated):     Assets:  Communication Skills Physical Health  Sleep:  Number of Hours: 6.75  Cognition: WNL  ADL's:  Intact   Mental Status Per Nursing Assessment::   On Admission:  (UTA)  Demographic Factors:  Low socioeconomic status and Unemployed  Loss Factors: Financial problems/change in socioeconomic status and NA  Historical Factors: Impulsivity  Risk Reduction Factors:   Religious beliefs about death  Continued Clinical Symptoms:  Schizophrenia:   Paranoid or undifferentiated type  Cognitive Features That Contribute To Risk:  Closed-mindedness, Loss of executive function and Polarized thinking    Suicide Risk:  Minimal: No identifiable suicidal ideation.  Patients presenting with no risk factors but with morbid ruminations; may be classified as minimal risk based on the severity of the depressive symptoms  Follow-up Information    Pt denies all  aftercare Follow up.           Plan Of Care/Follow-up recommendations:  Activity:  full  Wynn Alldredge, MD 03/11/2019, 7:58 AM

## 2019-03-11 NOTE — Progress Notes (Signed)
Patient ID: Valerie Howard, female   DOB: [birth year], 43 y.o.   MRN: [id number]   CSW met with patient to discuss discharge. Pt stated that she is not leaving until housing is found for her. CSW has given the patient several pages of housing resources. Pt stated, "you know my situation and I am not going to a shelter, jail, or to any of them places. It's your job to find me housing". CSW informed the patient that the CSW gave the pt multiple resources for housing. Pt continued to state that those will not work for her due to location and it not fitting what she wants. CSW offered shelter resources. Pt denied those resources. Pt began to get agitated with CSW. Pt stated that police will not come get her. She will stay here until she receives housing. CSW informed patient that she cannot stay at the hospital and ample resources have been given to her.

## 2019-03-15 ENCOUNTER — Other Ambulatory Visit: Payer: Self-pay | Admitting: Family

## 2019-03-15 ENCOUNTER — Other Ambulatory Visit: Payer: Self-pay

## 2019-03-15 ENCOUNTER — Observation Stay (HOSPITAL_COMMUNITY)
Admission: RE | Admit: 2019-03-15 | Discharge: 2019-03-18 | Disposition: A | Discharge status: Home / Self Care | Payer: Medicaid Other | Attending: Psychiatry | Admitting: Psychiatry

## 2019-03-15 ENCOUNTER — Encounter (HOSPITAL_COMMUNITY): Payer: Self-pay

## 2019-03-15 DIAGNOSIS — Z560 Unemployment, unspecified: Secondary | ICD-10-CM | POA: Diagnosis not present

## 2019-03-15 DIAGNOSIS — F3189 Other bipolar disorder: Secondary | ICD-10-CM | POA: Diagnosis present

## 2019-03-15 DIAGNOSIS — Z8249 Family history of ischemic heart disease and other diseases of the circulatory system: Secondary | ICD-10-CM | POA: Insufficient documentation

## 2019-03-15 DIAGNOSIS — Z79899 Other long term (current) drug therapy: Secondary | ICD-10-CM | POA: Insufficient documentation

## 2019-03-15 DIAGNOSIS — Z9114 Patient's other noncompliance with medication regimen: Secondary | ICD-10-CM | POA: Insufficient documentation

## 2019-03-15 DIAGNOSIS — F250 Schizoaffective disorder, bipolar type: Principal | ICD-10-CM | POA: Insufficient documentation

## 2019-03-15 MED ORDER — BENZTROPINE MESYLATE 1 MG PO TABS: 1.0000 mg | ORAL_TABLET | Freq: Four times a day (QID) | ORAL | Status: DC | PRN

## 2019-03-15 MED ORDER — HALOPERIDOL LACTATE 5 MG/ML IJ SOLN
10.0000 mg | Freq: Once | INTRAMUSCULAR | Status: AC
  Administered 2019-03-15: 10 mg via INTRAMUSCULAR

## 2019-03-15 MED ORDER — DIPHENHYDRAMINE HCL 50 MG/ML IJ SOLN
50.0000 mg | Freq: Once | INTRAMUSCULAR | Status: AC
  Administered 2019-03-15: 50 mg via INTRAMUSCULAR

## 2019-03-15 MED ORDER — ALUM & MAG HYDROXIDE-SIMETH 200-200-20 MG/5ML PO SUSP: 30.0000 mL | ORAL | Status: DC | PRN

## 2019-03-15 MED ORDER — TRAZODONE HCL 100 MG PO TABS: 100.0000 mg | ORAL_TABLET | Freq: Every evening | ORAL | Status: DC | PRN

## 2019-03-15 MED ORDER — LORAZEPAM 2 MG/ML IJ SOLN
2.0000 mg | Freq: Once | INTRAMUSCULAR | Status: AC
  Administered 2019-03-15: 16:00:00 2 mg via INTRAMUSCULAR

## 2019-03-15 MED ORDER — HALOPERIDOL 5 MG PO TABS: 5.0000 mg | ORAL_TABLET | Freq: Four times a day (QID) | ORAL | Status: DC | PRN

## 2019-03-15 MED ORDER — ACETAMINOPHEN 325 MG PO TABS: 650.0000 mg | ORAL_TABLET | Freq: Four times a day (QID) | ORAL | Status: DC | PRN

## 2019-03-15 MED ORDER — MAGNESIUM HYDROXIDE 400 MG/5ML PO SUSP: 30.0000 mL | Freq: Every day | ORAL | Status: DC | PRN

## 2019-03-15 NOTE — BH Assessment (Signed)
[facility] Assessment Progress Note Case was staffed with Silvio Pate NP who recommended a inpatient admission to assist with stabilization.

## 2019-03-15 NOTE — Progress Notes (Signed)
Attempted to get vital signs. Angry. Refuses. "QUIT!"

## 2019-03-15 NOTE — BH Assessment (Signed)
Assessment Note  Valerie Howard is an 43 y.o. female that presents this date with IVC. Per IVC: Respondent has been diagnoised with Schizophrenia and is non-compliant with her medication regimen. She believes that people are watching her and trying to poison her which has been making her face twitch. At her apartment complex where she lives, she has threatened her neighbors and broke all the windows out of her apartment. She has been hostile and has not been letting people enter her home to fix the broken windows. She has cut up her furniture and destroyed her apartment. Patient denies allegations of the IVC and will not participate in the assessment. Patient is observed to be very angry and declines to render any history. This writer attempts to re-frame questions unsuccessfully. Information to complete assessment was obtained from history and previous assessment. Per notes patient has a history of  Schizophrenia and psychosis with multiple admissions to area providers. Patient was last seen on 02/27/19 when she presented with similar behaviors with IVC at that time. Patient was tangential in speech and delusional at the time of assessment. Patient has been admitted inpatient in the past for psychotic behavior and previously diagnosed with schizophrenia. Limited history is provided per note review as evidenced by patient's paranoid delusional state at the time of each assessment. It is unclear where patient was initially diagnosed and where she has been receiving OP care. Case was staffed with Silvio Pate NP who recommended a inpatient admission to assist with stabilization.       Diagnosis: F29.0 Schizophrenia   Past Medical History:  Past Medical History:  Diagnosis Date  . History of radiation therapy 07/18/17-07/24/17   right ear, operative bed 12 Gy in 3 fractions  . Keloid    right ear  . Paranoid behavior ([place])   . Schizophrenia [facility])     Past Surgical History:  Procedure Laterality Date  .  CESAREAN SECTION    . KENALOG INJECTION Bilateral 07/17/2017   Procedure: KENALOG INJECTION;  Surgeon: Wallace Going, DO;  Location: [place];  Service: Plastics;  Laterality: Bilateral;  . MASS EXCISION N/A 07/17/2017   Procedure: EXCISION KELOID OF RIGHT EAR WITH INTROPERATIVE KENALOG INJECTION AND POST OP RADIATION;  Surgeon: Wallace Going, DO;  Location: [place];  Service: Plastics;  Laterality: N/A;  . TUBAL LIGATION      Family History:  Family History  Problem Relation Age of Onset  . Hypertension Other   . Diabetes Other   . Cancer Other   . Keloids Mother   . Breast cancer Maternal Grandmother     Social History:  reports that she has never smoked. She has never used smokeless tobacco. She reports current alcohol use. She reports that she does not use drugs.  Additional Social History:  Alcohol / Drug Use Pain Medications: See MAR Prescriptions: See MAR Over the Counter: See MAR History of alcohol / drug use?: Yes Longest period of sobriety (when/how long): UTA Negative Consequences of Use: (Denies) Withdrawal Symptoms: (Denies) Substance #1 Name of Substance 1: Alcohol per hx 1 - Age of First Use: UTA 1 - Amount (size/oz): UTA 1 - Frequency: UTA 1 - Duration: UTA 1 - Last Use / Amount: UTA BAL and UDS pending  CIWA:   COWS:    Allergies:  Allergies  Allergen Reactions  . Penicillins Rash    Has patient had a PCN reaction causing immediate rash, facial/tongue/throat swelling, SOB or lightheadedness with hypotension:Patient refuses  to answer (PRA) Has patient had a PCN reaction causing severe rash involving mucus membranes or skin necrosis:PRA Has patient had a PCN reaction that required hospitalization PRA Has patient had a PCN reaction occurring within the last 10 years:PRA If all of the above answers are "NO", then may proceed with Cephalosporin use.     Home Medications: (Not in a hospital  admission)   OB/GYN Status:  Patient's last menstrual period was 02/27/2019 (exact date).  General Assessment Data Location of Assessment: [facility] Assessment Services TTS Assessment: In system Is this a Tele or Face-to-Face Assessment?: Face-to-Face Is this an Initial Assessment or a Re-assessment for this encounter?: Initial Assessment Patient Accompanied by:: N/A Language Other than English: No Living Arrangements: Other (Comment) What gender do you identify as?: Female Marital status: Divorced Maiden name: Sawyers Pregnancy Status: No Living Arrangements: Children Can pt return to current living arrangement?: Yes Admission Status: Involuntary Petitioner: Family member Is patient capable of signing voluntary admission?: Yes Referral Source: Self/Family/Friend Insurance type: Medicaid  Medical Screening Exam ([place]) Medical Exam completed: Yes  Crisis Care Plan Living Arrangements: Children Legal Guardian: (NA) Name of Psychiatrist: [place] Name of Therapist: UTA  Education Status Is patient currently in school?: No Highest grade of school patient has completed: MA Is the patient employed, unemployed or receiving disability?: Unemployed  Risk to self with the past 6 months Suicidal Ideation: No Has patient been a risk to self within the past 6 months prior to admission? : No Suicidal Intent: No Has patient had any suicidal intent within the past 6 months prior to admission? : No Is patient at risk for suicide?: No, but patient needs Medical Clearance Suicidal Plan?: No Has patient had any suicidal plan within the past 6 months prior to admission? : No Access to Means: No What has been your use of drugs/alcohol within the last 12 months?: NA Previous Attempts/Gestures: No How many times?: 0 Other Self Harm Risks: (Off medications) Triggers for Past Attempts: (NA) Intentional Self Injurious Behavior: None Family Suicide History: No Recent stressful life  event(s): (Off medications) Persecutory voices/beliefs?: No Depression: (UTA) Depression Symptoms: (UTA) Substance abuse history and/or treatment for substance abuse?: No Suicide prevention information given to non-admitted patients: Not applicable  Risk to Others within the past 6 months Homicidal Ideation: No Does patient have any lifetime risk of violence toward others beyond the six months prior to admission? : No Thoughts of Harm to Others: No Current Homicidal Intent: No Current Homicidal Plan: No Access to Homicidal Means: No Identified Victim: NA History of harm to others?: No Assessment of Violence: None Noted Violent Behavior Description: NA Does patient have access to weapons?: No Criminal Charges Pending?: No Does patient have a court date: No Is patient on probation?: No  Psychosis Hallucinations: None noted Delusions: Persecutory  Mental Status Report Appearance/Hygiene: Unremarkable Eye Contact: Poor Motor Activity: Agitation Speech: Argumentative Level of Consciousness: Irritable Mood: Threatening Affect: Angry Anxiety Level: Severe Thought Processes: Unable to Assess Judgement: Unable to Assess Orientation: Unable to assess Obsessive Compulsive Thoughts/Behaviors: Unable to Assess  Cognitive Functioning Concentration: Unable to Assess Memory: Unable to Assess Is patient IDD: No Insight: Unable to Assess Impulse Control: Unable to Assess Appetite: (UTA) Have you had any weight changes? : (UTA) Sleep: (UTA) Total Hours of Sleep: (UTA) Vegetative Symptoms: Unable to Assess  ADLScreening [facility] Assessment Services) Patient's cognitive ability adequate to safely complete daily activities?: Yes Patient able to express need for assistance with ADLs?: Yes Independently performs  ADLs?: Yes (appropriate for developmental age)  Prior Inpatient Therapy Prior Inpatient Therapy: Yes Prior Therapy Dates: 2020, 2019, 2018 Prior Therapy Facilty/Provider(s):  [facility], [facility], [place] Reason for Treatment: MH issues  Prior Outpatient Therapy Prior Outpatient Therapy: (UTA)  ADL Screening (condition at time of admission) Patient's cognitive ability adequate to safely complete daily activities?: Yes Is the patient deaf or have difficulty hearing?: No Does the patient have difficulty seeing, even when wearing glasses/contacts?: No Does the patient have difficulty concentrating, remembering, or making decisions?: No Patient able to express need for assistance with ADLs?: Yes Does the patient have difficulty dressing or bathing?: No Independently performs ADLs?: Yes (appropriate for developmental age) Does the patient have difficulty walking or climbing stairs?: No Weakness of Legs: None Weakness of Arms/Hands: None  Home Assistive Devices/Equipment Home Assistive Devices/Equipment: None  Therapy Consults (therapy consults require a physician order) PT Evaluation Needed: No OT Evalulation Needed: No SLP Evaluation Needed: No Abuse/Neglect Assessment (Assessment to be complete while patient is alone) Physical Abuse: Denies Verbal Abuse: Denies Sexual Abuse: Denies Exploitation of patient/patient's resources: Denies Self-Neglect: Denies Values / Beliefs Cultural Requests During Hospitalization: None Spiritual Requests During Hospitalization: None Consults Spiritual Care Consult Needed: No Social Work Consult Needed: No Regulatory affairs officer (For Healthcare) Does Patient Have a Medical Advance Directive?: No Would patient like information on creating a medical advance directive?: No - Patient declined          Disposition: Case was staffed with Silvio Pate NP who recommended a inpatient admission to assist with stabilization.      Disposition Initial Assessment Completed for this Encounter: Yes Disposition of Patient: Admit Type of inpatient treatment program: Adult Patient refused recommended treatment: Yes Type of treatment offered and  refused: In-patient Mode of transportation if patient is discharged/movement?: Tomasita Crumble)  On Site Evaluation by:   Reviewed with Physician:    Mamie Nick 03/15/2019 2:33 PM

## 2019-03-15 NOTE — H&P (Signed)
[place] [facility] Provider Admission PAA/H&P  Patient Identification: Valerie Howard MRN:  [id number] Date of Evaluation:  03/15/2019 Chief Complaint:  Schizophrenia Principal Diagnosis: Chronically untreated psychotic disorder Diagnosis:  Active Problems:   * No active hospital problems. *  History of Present Illness:    Valerie Howard is an 43 y.o. female that presents this date with IVC. Per IVC: Respondent has been diagnoised with Schizophrenia and is non-compliant with her medication regimen. She believes that people are watching her and trying to poison her which has been making her face twitch. At her apartment complex where she lives, she has threatened her neighbors and broke all the windows out of her apartment. She has been hostile and has not been letting people enter her home to fix the broken windows. She has cut up her furniture and destroyed her apartment. Patient denies allegations of the IVC and will not participate in the assessment. Patient is observed to be very angry and declines to render any history. This writer attempts to re-frame questions unsuccessfully. Information to complete assessment was obtained from history and previous assessment. Per notes patient has a history of  Schizophrenia and psychosis with multiple admissions to area providers. Patient was last seen on 02/27/19 when she presented with similar behaviors with IVC at that time. Patient was tangential in speech and delusional at the time of assessment. Patient has been admitted inpatient in the past for psychotic behavior and previously diagnosed with schizophrenia. Limited history is provided per note review as evidenced by patient's paranoid delusional state at the time of each assessment. It is unclear where patient was initially diagnosed and where she has been receiving OP care. Case was staffed with Silvio Pate NP who recommended a inpatient admission to assist with stabilization.       Associated  Signs/Symptoms: Depression Symptoms:  psychomotor agitation, (Hypo) Manic Symptoms:  Flight of Ideas, Anxiety Symptoms:  n/a Psychotic Symptoms:  Hallucinations: Auditory PTSD Symptoms: NA Total Time spent with patient: 45 minutes  Past Psychiatric History: exstensive  Is the patient at risk to self? Yes.    Has the patient been a risk to self in the past 6 months? Yes.    Has the patient been a risk to self within the distant past? Yes.    Is the patient a risk to others? Yes.    Has the patient been a risk to others in the past 6 months? No.  Has the patient been a risk to others within the distant past? Yes.     Alcohol Screening:   Substance Abuse History in the last 12 months:  No. Consequences of Substance Abuse: NA Previous Psychotropic Medications: Yes  Psychological Evaluations: No  Past Medical History:  Past Medical History:  Diagnosis Date  . History of radiation therapy 07/18/17-07/24/17   right ear, operative bed 12 Gy in 3 fractions  . Keloid    right ear  . Paranoid behavior ([place])   . Schizophrenia [facility])     Past Surgical History:  Procedure Laterality Date  . CESAREAN SECTION    . KENALOG INJECTION Bilateral 07/17/2017   Procedure: KENALOG INJECTION;  Surgeon: Wallace Going, DO;  Location: [place];  Service: Plastics;  Laterality: Bilateral;  . MASS EXCISION N/A 07/17/2017   Procedure: EXCISION KELOID OF RIGHT EAR WITH INTROPERATIVE KENALOG INJECTION AND POST OP RADIATION;  Surgeon: Wallace Going, DO;  Location: [place];  Service: Plastics;  Laterality: N/A;  . TUBAL  LIGATION     Family History:  Family History  Problem Relation Age of Onset  . Hypertension Other   . Diabetes Other   . Cancer Other   . Keloids Mother   . Breast cancer Maternal Grandmother     Tobacco Screening:   Social History:  Social History   Substance and Sexual Activity  Alcohol Use Yes   Comment: socially     Social  History   Substance and Sexual Activity  Drug Use No    Additional Social History: Marital status: Divorced    Pain Medications: See MAR Prescriptions: See MAR Over the Counter: See MAR History of alcohol / drug use?: Yes Longest period of sobriety (when/how long): UTA Negative Consequences of Use: (Denies) Withdrawal Symptoms: (Denies) Name of Substance 1: Alcohol per hx 1 - Age of First Use: UTA 1 - Amount (size/oz): UTA 1 - Frequency: UTA 1 - Duration: UTA 1 - Last Use / Amount: UTA BAL and UDS pending                  Allergies:   Allergies  Allergen Reactions  . Penicillins Rash    Has patient had a PCN reaction causing immediate rash, facial/tongue/throat swelling, SOB or lightheadedness with hypotension:Patient refuses to answer (PRA) Has patient had a PCN reaction causing severe rash involving mucus membranes or skin necrosis:PRA Has patient had a PCN reaction that required hospitalization PRA Has patient had a PCN reaction occurring within the last 10 years:PRA If all of the above answers are "NO", then may proceed with Cephalosporin use.    Lab Results: No results found for this or any previous visit (from the past 48 hour(s)).  Blood Alcohol level:  Lab Results  Component Value Date   ETH <10 02/27/2019   ETH <5 53/79/4327    Metabolic Disorder Labs:  Lab Results  Component Value Date   HGBA1C 4.5 (L) 02/27/2019   MPG 82.45 02/27/2019   Lab Results  Component Value Date   PROLACTIN 72.6 (H) 02/27/2019   Lab Results  Component Value Date   CHOL 171 02/27/2019   TRIG 81 02/27/2019   HDL 43 02/27/2019   CHOLHDL 4.0 02/27/2019   VLDL 16 02/27/2019   LDLCALC 112 (H) 02/27/2019    Current Medications: No current facility-administered medications for this encounter.    PTA Medications: Medications Prior to Admission  Medication Sig Dispense Refill Last Dose  . benztropine (COGENTIN) 1 MG tablet Take 1 tablet (1 mg total) by mouth daily.  30 tablet 2   . ferrous sulfate 325 (65 FE) MG tablet Take 1 tablet (325 mg total) by mouth 2 (two) times daily with a meal. 60 tablet 3   . folic acid (FOLVITE) 1 MG tablet Take 1 tablet (1 mg total) by mouth daily. 30 tablet 0   . haloperidol (HALDOL) 10 MG tablet Take 1 tablet (10 mg total) by mouth 2 (two) times daily. 60 tablet 2   . vitamin B-12 100 MCG tablet Take 1 tablet (100 mcg total) by mouth daily. 30 tablet 0    Musculoskeletal: Strength & Muscle Tone: within normal limits Gait & Station: normal Patient leans: N/A  Psychiatric Specialty Exam: Physical Exam vital signs stable no involuntary movements  ROS neurological negative for seizures or head trauma endocrine negative for thyroid disease cardiovascular negative GI GU negative  Last menstrual period 02/27/2019.There is no height or weight on file to calculate BMI.  General Appearance: Casual  Eye Contact:  Fair  Speech:  Clear and Coherent  Volume:  Increased  Mood:  Irritable  Affect:  Congruent, Labile  Thought Process:  Irrelevant and Descriptions of Associations: Loose  Orientation:  Full (Time, Place, and Person)  Thought Content:  Illogical  Suicidal Thoughts:  No  Homicidal Thoughts:  No  Memory:  Recent;   Fair  Judgement: lacks  Insight:  lacks  Psychomotor Activity:  Normal  Concentration:  Concentration: Fair  Recall:  [employer] of Knowledge:  Fair  Language:  Good  Akathisia:  Negative  Handed:  Right  AIMS (if indicated):     Assets:  Communication Skills Leisure Time Physical Health  ADL's:  Intact  Cognition:  WNL  Sleep:  Number of Hours: 6.75       Treatment Plan Summary: Daily contact with patient to assess and evaluate symptoms and progress in treatment and Medication management Admit to observation  Observation Level/Precautions:  15 minute checks  Laboratory: uds, cbc (hx of anemia), cmp, and EKG  Psychotherapy: Cognitive and reality as well as med and illness education   Medications: Resume Haldol may need forced meds  Consultations: Not necessary  Discharge Concerns: Long-term compliance and stability  Estimated LOS: 5-7  Other: Axis I chronic schizoaffective disorder chronic refusal of care    I certify that inpatient services furnished can reasonably be expected to improve the patient's condition.    Suella Broad, FNP

## 2019-03-15 NOTE — Progress Notes (Signed)
Resting in bed. Quiet. Refuses COVID testing. "no." Attempted to educate,continues to refuse and now electively mute.

## 2019-03-15 NOTE — Progress Notes (Signed)
Patient ID: Valerie Howard, female   DOB: [birth year], 43 y.o.   MRN: [id number] Patient brought to OBS unit under IVC by gpd.  Patient irritable and agitated not allowing skin assessment or personal safety search.  Modified search complete and patient found to be free of all injury and contraband.  Patient refused to disrobe and put on scrubs.  AC notified.  Patient given prn medications 10 haldol 2 ativan and 50 benadryl.

## 2019-03-16 DIAGNOSIS — F209 Schizophrenia, unspecified: Secondary | ICD-10-CM

## 2019-03-16 DIAGNOSIS — F250 Schizoaffective disorder, bipolar type: Secondary | ICD-10-CM | POA: Diagnosis not present

## 2019-03-16 NOTE — Progress Notes (Signed)
Argumentive. Refuses vital signs. Refuses medications. Says she does not need to be here. Becomes angry and defensive when trying to support and educate. Calls M.D. a "[place]' Hole" and "Fake" . Does not want to be here but says she will not discuss it with M.D. in the morning.

## 2019-03-16 NOTE — Progress Notes (Signed)
Patient refused vital signs this morning despite multiple attempts. Patient refused lab draw and declines EKG at this time. At present, patient is stating that she does not want to "work" here, and that she is awaiting discharge. Patient consumed breakfast tray and made a call to the car insurance company this morning. States that otherwise she is only interested in being left alone. Currently lying in bed awake. Will continue to monitor.

## 2019-03-16 NOTE — Progress Notes (Signed)
At present, patient is in the dayroom eating her supper tray. Patient typically retreats to her room often to lie on her bed awake, often times verbalizing her desire to not be bothered by staff. Patient remains in her personal clothing, refusing to wear scrubs. Patient continues to refuse vital signs, blood draws, urine, EKG, and covid screen. Patient is dismissive when staff attempts to discuss these needs.  Patient comes out of her room to request snacks which are provided when patient asks. Patient also has been overheard making calls to her children, and an unknown Female. Patient is loud when leaving a voice message on Males phone, stating that she refuses to go get disability benefits and she is not going to allow him to continue to send her to the hospital. Patient at one point approaches this writer insisting that "we" are playing games with her and that she is aware of this. Patient is not at this time is not receptive to support and encouragement provided. Patient remains safe at this time. Will continue to monitor. Covid screening assessment not completed at this time, as patient is refusing to provide temperature or comply with screening questions.

## 2019-03-16 NOTE — Consult Note (Addendum)
[facility] Face-to-Face Psychiatry Consult   Reason for Consult:  Paranoid and bizarre behavior Referring Physician:  Dr Dwyane Dee Patient Identification: Valerie Howard MRN:  [id number] Principal Diagnosis: Schizophrenia ([place]) Diagnosis:  Principal Problem:   Schizophrenia ([place])   Total Time spent with patient: 30 minutes  Subjective:   Valerie Howard is a 43 y.o. female patient admitted with IVC for paranoid and bizarre behavior at home.  HPI:  Pt was seen and chart reviewed with treatment team and Dr Darleene Cleaver. Pt is well known to the treatment team. She was hospitalized on the 500 hall at [facility] from 7-8 to 03-11-2019 for the same presentation. Pt refuses to take her medications when she is not in the hospital. She does not believe she needs medication or any type of care from the staff. She is dismissive and rude in her dealings with the staff and the treatment team. Today she was lying on her bed, with her street clothes on, and stated "I don't want to talk to him (Dr Darleene Cleaver) because I don't trust him." She then stated, "that is all, cut the light off." She has refused to change into paper scrubs, refused vitals and labs, and refused to take medications. She was placed under IVC by her daughter for acting bizarrely at her apartment. Reportedly she broke out all the windows and cut up the furniture. She will remain on the observation unit for safety purposes. She will be re-evaluated by psychiatry in the AM.   Past Psychiatric History: As above  Risk to Self: Suicidal Ideation: No Suicidal Intent: No Is patient at risk for suicide?: No, but patient needs Medical Clearance Suicidal Plan?: No Access to Means: No What has been your use of drugs/alcohol within the last 12 months?: NA How many times?: 0 Other Self Harm Risks: (Off medications) Triggers for Past Attempts: (NA) Intentional Self Injurious Behavior: None Risk to Others: Homicidal Ideation: No Thoughts of Harm to Others: No Current  Homicidal Intent: No Current Homicidal Plan: No Access to Homicidal Means: No Identified Victim: NA History of harm to others?: No Assessment of Violence: None Noted Violent Behavior Description: NA Does patient have access to weapons?: No Criminal Charges Pending?: No Does patient have a court date: No Prior Inpatient Therapy: Prior Inpatient Therapy: Yes Prior Therapy Dates: 2020, 2019, 2018 Prior Therapy Facilty/Provider(s): [facility], [place], [place] Reason for Treatment: MH issues Prior Outpatient Therapy: Prior Outpatient Therapy: [facility])  Past Medical History:  Past Medical History:  Diagnosis Date  . History of radiation therapy 07/18/17-07/24/17   right ear, operative bed 12 Gy in 3 fractions  . Keloid    right ear  . Paranoid behavior ([place])   . Schizophrenia [facility])     Past Surgical History:  Procedure Laterality Date  . CESAREAN SECTION    . KENALOG INJECTION Bilateral 07/17/2017   Procedure: KENALOG INJECTION;  Surgeon: Wallace Going, DO;  Location: [place];  Service: Plastics;  Laterality: Bilateral;  . MASS EXCISION N/A 07/17/2017   Procedure: EXCISION KELOID OF RIGHT EAR WITH INTROPERATIVE KENALOG INJECTION AND POST OP RADIATION;  Surgeon: Wallace Going, DO;  Location: [place];  Service: Plastics;  Laterality: N/A;  . TUBAL LIGATION     Family History:  Family History  Problem Relation Age of Onset  . Hypertension Other   . Diabetes Other   . Cancer Other   . Keloids Mother   . Breast cancer Maternal Grandmother    Family Psychiatric  History: Pt did not give this information Social History:  Social History   Substance and Sexual Activity  Alcohol Use Yes   Comment: socially     Social History   Substance and Sexual Activity  Drug Use No    Social History   Socioeconomic History  . Marital status: Divorced    Spouse name: Not on file  . Number of children: 4  . Years of education: Not on file  . Highest  education level: Not on file  Occupational History  . Occupation: unemployed  Social Needs  . Financial resource strain: Not on file  . Food insecurity    Worry: Not on file    Inability: Not on file  . Transportation needs    Medical: Not on file    Non-medical: Not on file  Tobacco Use  . Smoking status: Never Smoker  . Smokeless tobacco: Never Used  Substance and Sexual Activity  . Alcohol use: Yes    Comment: socially  . Drug use: No  . Sexual activity: Not Currently    Birth control/protection: Surgical  Lifestyle  . Physical activity    Days per week: Not on file    Minutes per session: Not on file  . Stress: Not on file  Relationships  . Social Herbalist on phone: Not on file    Gets together: Not on file    Attends religious service: Not on file    Active member of club or organization: Not on file    Attends meetings of clubs or organizations: Not on file    Relationship status: Not on file  Other Topics Concern  . Not on file  Social History Narrative  . Not on file   Additional Social History:    Allergies:   Allergies  Allergen Reactions  . Penicillins Rash    Has patient had a PCN reaction causing immediate rash, facial/tongue/throat swelling, SOB or lightheadedness with hypotension:Patient refuses to answer (PRA) Has patient had a PCN reaction causing severe rash involving mucus membranes or skin necrosis:PRA Has patient had a PCN reaction that required hospitalization PRA Has patient had a PCN reaction occurring within the last 10 years:PRA If all of the above answers are "NO", then may proceed with Cephalosporin use.     Labs: No results found for this or any previous visit (from the past 48 hour(s)).  Current Facility-Administered Medications  Medication Dose Route Frequency Provider Last Rate Last Dose  . acetaminophen (TYLENOL) tablet 650 mg  650 mg Oral Q6H PRN Suella Broad, FNP      . alum & mag hydroxide-simeth  (MAALOX/MYLANTA) 200-200-20 MG/5ML suspension 30 mL  30 mL Oral Q4H PRN Starkes-Perry, Gayland Curry, FNP      . haloperidol (HALDOL) tablet 5 mg  5 mg Oral Q6H PRN Starkes-Perry, Gayland Curry, FNP       And  . benztropine (COGENTIN) tablet 1 mg  1 mg Oral Q6H PRN Starkes-Perry, Gayland Curry, FNP      . magnesium hydroxide (MILK OF MAGNESIA) suspension 30 mL  30 mL Oral Daily PRN Starkes-Perry, Gayland Curry, FNP      . traZODone (DESYREL) tablet 100 mg  100 mg Oral QHS PRN Suella Broad, FNP        Musculoskeletal: Strength & Muscle Tone: within normal limits Gait & Station: normal Patient leans: N/A  Psychiatric Specialty Exam: Physical Exam  Constitutional: She is oriented to person, place, and time. She appears  well-developed and well-nourished.  HENT:  Head: Normocephalic.  Respiratory: Effort normal.  Musculoskeletal: Normal range of motion.  Neurological: She is alert and oriented to person, place, and time.  Psychiatric: Her affect is labile. Her speech is rapid and/or pressured. She is agitated. Thought content is paranoid.    Review of Systems  Psychiatric/Behavioral: The patient is nervous/anxious.   All other systems reviewed and are negative.   Last menstrual period 02/27/2019.There is no height or weight on file to calculate BMI.  General Appearance: Casual  Eye Contact:  Minimal  Speech:  Pressured  Volume:  Normal  Mood:  Angry, Anxious and Irritable  Affect:  Congruent and Labile  Thought Process:  Disorganized  Orientation:  Full (Time, Place, and Person)  Thought Content:  Illogical and Ideas of Reference:   Paranoia  Suicidal Thoughts:  No  Homicidal Thoughts:  No  Memory:  Immediate;   Fair Recent;   Fair Remote;   Fair  Judgement:  Impaired  Insight:  Shallow  Psychomotor Activity:  Normal  Concentration:  Concentration: Fair and Attention Span: Fair  Recall:  [employer] of Knowledge:  Fair  Language:  Good  Akathisia:  Negative  Handed:  Right  AIMS (if  indicated):     Assets:  Communication Skills  ADL's:  Intact  Cognition:  WNL  Sleep:        Treatment Plan Summary: Daily contact with patient to assess and evaluate symptoms and progress in treatment, Medication management and Plan Continue to observe on the observation unit  Pt is refusing all medications Available for agitation/aggression: Haldol 5 mg PO Q 6hrs PRN Cogentin 1 mg PO Q 6hrs PRN Trazodone 100 mg QHS PRN for sleep   Disposition: Remain on the observation unit  Encouragement and support offered    Ethelene Hal, NP 03/16/2019 1:23 PM  Patient seen face-to-face for psychiatric evaluation, chart reviewed and case discussed with the physician extender and developed treatment plan. Reviewed the information documented and agree with the treatment plan. Corena Pilgrim, MD

## 2019-03-17 DIAGNOSIS — F250 Schizoaffective disorder, bipolar type: Secondary | ICD-10-CM | POA: Diagnosis not present

## 2019-03-17 NOTE — Progress Notes (Signed)
Pt was irritable this morning, stating that she did not want to be bothered, and that she didn't need anything.  Pt has been trying to reach family on the telephone, but no one has answered when she calls.  Her appetite has been good, and she has not had any behavioral episodes this shift.    Pt monitored on Level 3 checks.  Safety maintained.\      COVID-19 Daily Checkoff  Have you had a fever (temp > 37.80C/100F)  in the past 24 hours?  No  If you have had runny nose, nasal congestion, sneezing in the past 24 hours, has it worsened? No  COVID-19 EXPOSURE  Have you traveled outside the state in the past 14 days? No  Have you been in contact with someone with a confirmed diagnosis of COVID-19 or PUI in the past 14 days without wearing appropriate PPE? No  Have you been living in the same home as a person with confirmed diagnosis of COVID-19 or a PUI (household contact)? No  Have you been diagnosed with COVID-19? No

## 2019-03-17 NOTE — Progress Notes (Signed)
Continues to refuse vital signs.Asked "why" we needed to get them. I explained to patient wanted to make sure they were o.k. She responds, "They are."

## 2019-03-17 NOTE — Consult Note (Addendum)
[facility] Face-to-Face Psychiatry Consult   Reason for Consult:  Paranoid and bizarre behavior Referring Physician:  Dr Dwyane Dee Patient Identification: Valerie Howard MRN:  [id number] Principal Diagnosis: Schizophrenia ([place]) Diagnosis:  Principal Problem:   Schizophrenia ([place])   Total Time spent with patient: 30 minutes  Subjective:   Valerie Howard is a 43 y.o. female patient admitted with IVC for paranoid and bizarre behavior at home.  HPI:  Pt was seen and chart reviewed with treatment team and Dr Darleene Cleaver. Pt is well known to the treatment team. She was hospitalized on the 500 hall at [facility] from 7-8 to 03-11-2019 for the same presentation, she refused all treatment and medications while hospitalized.  Pt refuses to take her mental health medications when she is not in the hospital. She does not believe she needs medication or any type of care from the staff. She is dismissive and rude in her dealings with the staff and the treatment team.   Today she was a bit more approachable but only agreed to answer one question. She stated she slept ok last night. She then stated "I do not belong here, I don't need anything and I want to leave."  She then stated, ""that is all, have a nice day." She has refused to change into paper scrubs, refused vitals and labs, and refused to take medications. She was placed under IVC by her daughter for acting bizarrely at her apartment. Reportedly she broke out all the windows and cut up the furniture. She has not been aggressive or agitated on the OBS unit. She will remain on the observation unit for another day for safety purposes. She will be re-evaluated by psychiatry in the AM, likely will be able to be discharged if she remains calm.   Past Psychiatric History: As above  Risk to Self: Suicidal Ideation: No Suicidal Intent: No Is patient at risk for suicide?: No, but patient needs Medical Clearance Suicidal Plan?: No Access to Means: No What has been your use of  drugs/alcohol within the last 12 months?: NA How many times?: 0 Other Self Harm Risks: (Off medications) Triggers for Past Attempts: (NA) Intentional Self Injurious Behavior: None Risk to Others: Homicidal Ideation: No Thoughts of Harm to Others: No Current Homicidal Intent: No Current Homicidal Plan: No Access to Homicidal Means: No Identified Victim: NA History of harm to others?: No Assessment of Violence: None Noted Violent Behavior Description: NA Does patient have access to weapons?: No Criminal Charges Pending?: No Does patient have a court date: No Prior Inpatient Therapy: Prior Inpatient Therapy: Yes Prior Therapy Dates: 2020, 2019, 2018 Prior Therapy Facilty/Provider(s): [facility], [place], [place] Reason for Treatment: MH issues Prior Outpatient Therapy: Prior Outpatient Therapy: [facility])  Past Medical History:  Past Medical History:  Diagnosis Date  . History of radiation therapy 07/18/17-07/24/17   right ear, operative bed 12 Gy in 3 fractions  . Keloid    right ear  . Paranoid behavior ([place])   . Schizophrenia [facility])     Past Surgical History:  Procedure Laterality Date  . CESAREAN SECTION    . KENALOG INJECTION Bilateral 07/17/2017   Procedure: KENALOG INJECTION;  Surgeon: Wallace Going, DO;  Location: [place];  Service: Plastics;  Laterality: Bilateral;  . MASS EXCISION N/A 07/17/2017   Procedure: EXCISION KELOID OF RIGHT EAR WITH INTROPERATIVE KENALOG INJECTION AND POST OP RADIATION;  Surgeon: Wallace Going, DO;  Location: [place];  Service: Plastics;  Laterality: N/A;  .  TUBAL LIGATION     Family History:  Family History  Problem Relation Age of Onset  . Hypertension Other   . Diabetes Other   . Cancer Other   . Keloids Mother   . Breast cancer Maternal Grandmother    Family Psychiatric  History: Pt did not give this information Social History:  Social History   Substance and Sexual Activity  Alcohol Use Yes    Comment: socially     Social History   Substance and Sexual Activity  Drug Use No    Social History   Socioeconomic History  . Marital status: Divorced    Spouse name: Not on file  . Number of children: 4  . Years of education: Not on file  . Highest education level: Not on file  Occupational History  . Occupation: unemployed  Social Needs  . Financial resource strain: Not on file  . Food insecurity    Worry: Not on file    Inability: Not on file  . Transportation needs    Medical: Not on file    Non-medical: Not on file  Tobacco Use  . Smoking status: Never Smoker  . Smokeless tobacco: Never Used  Substance and Sexual Activity  . Alcohol use: Yes    Comment: socially  . Drug use: No  . Sexual activity: Not Currently    Birth control/protection: Surgical  Lifestyle  . Physical activity    Days per week: Not on file    Minutes per session: Not on file  . Stress: Not on file  Relationships  . Social Herbalist on phone: Not on file    Gets together: Not on file    Attends religious service: Not on file    Active member of club or organization: Not on file    Attends meetings of clubs or organizations: Not on file    Relationship status: Not on file  Other Topics Concern  . Not on file  Social History Narrative  . Not on file   Additional Social History:    Allergies:   Allergies  Allergen Reactions  . Penicillins Rash    Has patient had a PCN reaction causing immediate rash, facial/tongue/throat swelling, SOB or lightheadedness with hypotension:Patient refuses to answer (PRA) Has patient had a PCN reaction causing severe rash involving mucus membranes or skin necrosis:PRA Has patient had a PCN reaction that required hospitalization PRA Has patient had a PCN reaction occurring within the last 10 years:PRA If all of the above answers are "NO", then may proceed with Cephalosporin use.     Labs: No results found for this or any previous visit  (from the past 48 hour(s)).  Current Facility-Administered Medications  Medication Dose Route Frequency Provider Last Rate Last Dose  . acetaminophen (TYLENOL) tablet 650 mg  650 mg Oral Q6H PRN Suella Broad, FNP      . alum & mag hydroxide-simeth (MAALOX/MYLANTA) 200-200-20 MG/5ML suspension 30 mL  30 mL Oral Q4H PRN Starkes-Perry, Gayland Curry, FNP      . haloperidol (HALDOL) tablet 5 mg  5 mg Oral Q6H PRN Starkes-Perry, Gayland Curry, FNP       And  . benztropine (COGENTIN) tablet 1 mg  1 mg Oral Q6H PRN Starkes-Perry, Gayland Curry, FNP      . magnesium hydroxide (MILK OF MAGNESIA) suspension 30 mL  30 mL Oral Daily PRN Starkes-Perry, Gayland Curry, FNP      . traZODone (DESYREL) tablet 100 mg  100 mg Oral QHS PRN Suella Broad, FNP        Musculoskeletal: Strength & Muscle Tone: within normal limits Gait & Station: normal Patient leans: N/A  Psychiatric Specialty Exam: Physical Exam  Constitutional: She is oriented to person, place, and time. She appears well-developed and well-nourished.  HENT:  Head: Normocephalic.  Respiratory: Effort normal.  Musculoskeletal: Normal range of motion.  Neurological: She is alert and oriented to person, place, and time.  Psychiatric: Her affect is labile. Her speech is rapid and/or pressured. She is agitated. Thought content is paranoid.    Review of Systems  Psychiatric/Behavioral: The patient is nervous/anxious.   All other systems reviewed and are negative.   Blood pressure 129/88, pulse (!) 135, temperature 98.7 F (37.1 C), temperature source Oral, last menstrual period 02/27/2019.There is no height or weight on file to calculate BMI.  General Appearance: Casual  Eye Contact:  Minimal  Speech:  Pressured  Volume:  Normal  Mood:  Angry, Anxious and Irritable  Affect:  Congruent and Labile  Thought Process:  Disorganized  Orientation:  Full (Time, Place, and Person)  Thought Content:  Illogical and Ideas of Reference:   Paranoia   Suicidal Thoughts:  No  Homicidal Thoughts:  No  Memory:  Immediate;   Fair Recent;   Fair Remote;   Fair  Judgement:  Impaired  Insight:  Shallow  Psychomotor Activity:  Normal  Concentration:  Concentration: Fair and Attention Span: Fair  Recall:  [employer] of Knowledge:  Fair  Language:  Good  Akathisia:  Negative  Handed:  Right  AIMS (if indicated):     Assets:  Communication Skills  ADL's:  Intact  Cognition:  WNL  Sleep:        Treatment Plan Summary: Daily contact with patient to assess and evaluate symptoms and progress in treatment, Medication management and Plan Continue to observe on the observation unit  Pt is refusing all medications Available for agitation/aggression: Haldol 5 mg PO Q 6hrs PRN Cogentin 1 mg PO Q 6hrs PRN Trazodone 100 mg QHS PRN for sleep   Disposition: Remain on the observation unit  Encouragement and support offered    Ethelene Hal, NP 03/17/2019 12:39 PM  Patient seen face-to-face for psychiatric evaluation, chart reviewed and case discussed with the physician extender and developed treatment plan. Reviewed the information documented and agree with the treatment plan. Corena Pilgrim, MD

## 2019-03-18 ENCOUNTER — Encounter (HOSPITAL_COMMUNITY): Payer: Self-pay | Admitting: Registered Nurse

## 2019-03-18 DIAGNOSIS — F250 Schizoaffective disorder, bipolar type: Secondary | ICD-10-CM

## 2019-03-18 NOTE — BH Assessment (Signed)
[facility] Assessment Progress Note  Per Buford Dresser, DO, this pt does not require psychiatric hospitalization at this time.  Pt presents under IVC initiated by pt's daughter and served on 03/15/2019, however, a First Examination was not completed within 24 hours, and the IVC expired on 03/16/2019.  Pt is to be discharged from [facility] with recommendation to follow up with [facility].  This has been included in pt's discharge instructions.  Pt's nurse, Arlyss Repress, has been notified.  [name], [place] Triage Specialist [phone]

## 2019-03-18 NOTE — Progress Notes (Signed)
[facility] NOVEL CORONAVIRUS (COVID-19) DAILY CHECK-OFF SYMPTOMS - answer yes or no to each - every day NO YES  Have you had a fever in the past 24 hours?  . Fever (Temp > 37.80C / 100F) X   Have you had any of these symptoms in the past 24 hours? . New Cough .  Sore Throat  .  Shortness of Breath .  Difficulty Breathing .  Unexplained Body Aches   X   Have you had any one of these symptoms in the past 24 hours not related to allergies?   . Runny Nose .  Nasal Congestion .  Sneezing   X   If you have had runny nose, nasal congestion, sneezing in the past 24 hours, has it worsened?  X   EXPOSURES - check yes or no X   Have you traveled outside the state in the past 14 days?  X   Have you been in contact with someone with a confirmed diagnosis of COVID-19 or PUI in the past 14 days without wearing appropriate PPE?  X   Have you been living in the same home as a person with confirmed diagnosis of COVID-19 or a PUI (household contact)?    X   Have you been diagnosed with COVID-19?    X              What to do next: Answered NO to all: Answered YES to anything:   Proceed with unit schedule Follow the [facility] Inpatient Flowsheet.   

## 2019-03-18 NOTE — Discharge Instructions (Signed)
For your mental health needs, you are advised to follow up with [facility].  Call them at your earliest opportunity to schedule an intake appointment: ° °     [facility] °     [address] °     [place], [place] [address] °     [phone] °     Crisis number: [phone] °

## 2019-03-18 NOTE — Progress Notes (Signed)
Valerie Howard is in dayroom watching T.V. She allowed MHT to get her vital signs but questioned why they were needed. She is cooperative but reports she likes MHT from day shift because "he minds his own business."  She indicates she does not need anything and does not want to be bothered.

## 2019-03-18 NOTE — Progress Notes (Signed)
D: Pt alert and oriented. Pt denies experiencing any pain, SI/HI, or AVH at this time. Pt reports she will be able to keep herself safe when they return home. Pt is eager to leave however does not want to leave the lobby with unable to find personal transportation. When receiving public transportation options pt states ya'll are not going to do me like last time and have security and the police make me leave. Pt did eventually leave only to be seen in the front lobby.    A: Pt received discharge and medication education/information. Pt had no belonging to return d/t no belongings being secured upon admission r/t pt refusing to turn belonging over.  R: Pt verbalized understanding of discharge and medication education/information.  Pt escorted to front lobby where where she eventually left without being escorted.   Upon discharge education pt reported that she was not going to contact [place] that they were not helpful in finding housing for her at all and that she was not going to go through that again.

## 2019-03-18 NOTE — Progress Notes (Signed)
D: Pt alert and oriented. Pt's affect/mood presents as irritable. Pt denies experiencing any problems with their sleep or appetite at this time. Pt denies experiencing any pain, SI/HI, or AVH at this time.   Pt refuses all medical help from this writer. Pt is very quick to answer any question asked by this Probation officer and provides very brief eye contact during communication.   When asked what brought her to [facility] pt replied her ex-husband wanting a divorce. Pt ask when the doctors are going to be in to see her because she is ready to leave.  A: Support and encouragement provided. Frequent verbal contact made. Routine safety checks conducted q15 minutes.   R: Pt verbally contracts for safety at this time.  Pt interacts minimally with staff and others on the unit. Pt remains safe at this time. Will continue to monitor.

## 2019-03-18 NOTE — Discharge Summary (Addendum)
[facility] Psych Observation Discharge  03/18/2019 1:10 PM LEKISHA MCGHEE  MRN:  [id number] Principal Problem: Schizoaffective disorder, bipolar type [facility]) Discharge Diagnoses: Principal Problem:   Schizophrenia ([place])   Subjective: Lorenda Hatchet, 43 y.o., female patient seen by this provider, Dr. Mariea Clonts; and chart reviewed on 03/18/19.  On evaluation Anntionette L Matuszak reports that she does not need to be here and that she doesn't need anything and wants to go home.  Patient denies suicidal/self-harm/homicidal ideation, psychosis, and paranoia. Patient reports that she is eating/sleeping without difficulty and that she does not take medications and doesn't want medication.  Nursing reports that there has been no verbal or behavioral outburst During evaluation Ahsley L Delbuono is alert/oriented x 4; calm/cooperative; and mood is congruent with affect.  She does not appear to be responding to internal/external stimuli or delusional thoughts.  Patient denies suicidal/self-harm/homicidal ideation, psychosis, and paranoia.  Patient answered question appropriately.     Total Time spent with patient: 30 minutes  Past Psychiatric History: Schizophrenia  Past Medical History:  Past Medical History:  Diagnosis Date  . History of radiation therapy 07/18/17-07/24/17   right ear, operative bed 12 Gy in 3 fractions  . Keloid    right ear  . Paranoid behavior ([place])   . Schizophrenia [facility])     Past Surgical History:  Procedure Laterality Date  . CESAREAN SECTION    . KENALOG INJECTION Bilateral 07/17/2017   Procedure: KENALOG INJECTION;  Surgeon: Wallace Going, DO;  Location: [place];  Service: Plastics;  Laterality: Bilateral;  . MASS EXCISION N/A 07/17/2017   Procedure: EXCISION KELOID OF RIGHT EAR WITH INTROPERATIVE KENALOG INJECTION AND POST OP RADIATION;  Surgeon: Wallace Going, DO;  Location: [place];  Service: Plastics;  Laterality: N/A;  . TUBAL  LIGATION     Family History:  Family History  Problem Relation Age of Onset  . Hypertension Other   . Diabetes Other   . Cancer Other   . Keloids Mother   . Breast cancer Maternal Grandmother    Family Psychiatric  History: Denies  Social History:  Social History   Substance and Sexual Activity  Alcohol Use Yes   Comment: socially     Social History   Substance and Sexual Activity  Drug Use No    Social History   Socioeconomic History  . Marital status: Divorced    Spouse name: Not on file  . Number of children: 4  . Years of education: Not on file  . Highest education level: Not on file  Occupational History  . Occupation: unemployed  Social Needs  . Financial resource strain: Not on file  . Food insecurity    Worry: Not on file    Inability: Not on file  . Transportation needs    Medical: Not on file    Non-medical: Not on file  Tobacco Use  . Smoking status: Never Smoker  . Smokeless tobacco: Never Used  Substance and Sexual Activity  . Alcohol use: Yes    Comment: socially  . Drug use: No  . Sexual activity: Not Currently    Birth control/protection: Surgical  Lifestyle  . Physical activity    Days per week: Not on file    Minutes per session: Not on file  . Stress: Not on file  Relationships  . Social Herbalist on phone: Not on file    Gets together: Not on file    Attends  religious service: Not on file    Active member of club or organization: Not on file    Attends meetings of clubs or organizations: Not on file    Relationship status: Not on file  Other Topics Concern  . Not on file  Social History Narrative  . Not on file    Has this patient used any form of tobacco in the last 30 days? (Cigarettes, Smokeless Tobacco, Cigars, and/or Pipes) Prescription not provided because: Patient doesn't use tobacco products  Current Medications: Current Facility-Administered Medications  Medication Dose Route Frequency Provider Last  Rate Last Dose  . acetaminophen (TYLENOL) tablet 650 mg  650 mg Oral Q6H PRN Suella Broad, FNP      . alum & mag hydroxide-simeth (MAALOX/MYLANTA) 200-200-20 MG/5ML suspension 30 mL  30 mL Oral Q4H PRN Starkes-Perry, Gayland Curry, FNP      . haloperidol (HALDOL) tablet 5 mg  5 mg Oral Q6H PRN Starkes-Perry, Gayland Curry, FNP       And  . benztropine (COGENTIN) tablet 1 mg  1 mg Oral Q6H PRN Starkes-Perry, Gayland Curry, FNP      . magnesium hydroxide (MILK OF MAGNESIA) suspension 30 mL  30 mL Oral Daily PRN Starkes-Perry, Gayland Curry, FNP      . traZODone (DESYREL) tablet 100 mg  100 mg Oral QHS PRN Suella Broad, FNP       PTA Medications: No medications prior to admission.    Musculoskeletal: Strength & Muscle Tone: within normal limits Gait & Station: normal Patient leans: N/A  Psychiatric Specialty Exam: Physical Exam  Nursing note and vitals reviewed. Constitutional: She is oriented to person, place, and time. No distress.  Neck: Normal range of motion.  Respiratory: Effort normal.  Musculoskeletal: Normal range of motion.  Neurological: She is alert and oriented to person, place, and time.  Skin: Skin is warm and dry.    Review of Systems  Psychiatric/Behavioral: Depression: Stable. Hallucinations: Denies. Substance abuse: Denies. Suicidal ideas: Denies. Nervous/anxious: Denies.   All other systems reviewed and are negative.   Blood pressure 129/88, pulse (!) 135, temperature 98.7 F (37.1 C), temperature source Oral, last menstrual period 02/27/2019.There is no height or weight on file to calculate BMI.  General Appearance: Casual  Eye Contact:  Good  Speech:  Clear and Coherent and Normal Rate  Volume:  Normal  Mood:  Appropriate "Fine"  Affect:  Appropriate and Congruent  Thought Process:  Coherent, Goal Directed and Descriptions of Associations: Intact  Orientation:  Full (Time, Place, and Person)  Thought Content:  WDL and Denies hallucinations, delusions, and  paranoia  Suicidal Thoughts:  No  Homicidal Thoughts:  No  Memory:  Immediate;   Good Recent;   Good Remote;   Good  Judgement:  Intact  Insight:  Present  Psychomotor Activity:  Normal  Concentration:  Concentration: Good and Attention Span: Good  Recall:  Good  Fund of Knowledge:  Good  Language:  Good  Akathisia:  No  Handed:  Right  AIMS (if indicated):   N/A  Assets:  Communication Skills Desire for Improvement Housing Social Support  ADL's:  Intact  Cognition:  WNL  Sleep:   N/A     Demographic Factors:  NA  Loss Factors: NA  Historical Factors: NA  Risk Reduction Factors:   Sense of responsibility to family, Religious beliefs about death and Positive social support  Continued Clinical Symptoms:  Previous Psychiatric Diagnoses and Treatments  Cognitive Features That Contribute To  Risk:  None    Suicide Risk:  Minimal: No identifiable suicidal ideation.  Patients presenting with no risk factors but with morbid ruminations; may be classified as minimal risk based on the severity of the depressive symptoms    Plan Of Care/Follow-up recommendations:  Activity:  As tolerated Diet:  heart healthy  Disposition: No evidence of imminent risk to self or others at present.   Patient does not meet criteria for psychiatric inpatient admission. Supportive therapy provided about ongoing stressors. Discussed crisis plan, support from social network, calling 911, coming to the Emergency Department, and calling Suicide Hotline.   Shuvon Rankin, NP 03/18/2019, 1:10 PM   Patient seen face-to-face for psychiatric evaluation, chart reviewed and case discussed with the physician extender and developed treatment plan. Reviewed the information documented and agree with the treatment plan.  Buford Dresser, DO 03/18/19 7:57 PM

## 2019-08-02 ENCOUNTER — Inpatient Hospital Stay (HOSPITAL_COMMUNITY)
Admission: EM | Admit: 2019-08-02 | Discharge: 2019-08-08 | DRG: 178 | Disposition: A | Discharge status: Home / Self Care | Payer: Medicaid Other | Source: Ambulatory Visit | Attending: Student | Admitting: Student

## 2019-08-02 ENCOUNTER — Encounter (HOSPITAL_COMMUNITY): Payer: Self-pay | Admitting: Clinical

## 2019-08-02 ENCOUNTER — Ambulatory Visit (HOSPITAL_COMMUNITY)
Admission: RE | Admit: 2019-08-02 | Discharge: 2019-08-02 | Disposition: A | Discharge status: Home / Self Care | Payer: Medicaid Other | Source: Home / Self Care | Attending: Psychiatry | Admitting: Psychiatry

## 2019-08-02 ENCOUNTER — Encounter (HOSPITAL_COMMUNITY): Payer: Self-pay | Admitting: Emergency Medicine

## 2019-08-02 ENCOUNTER — Other Ambulatory Visit: Payer: Self-pay

## 2019-08-02 DIAGNOSIS — D500 Iron deficiency anemia secondary to blood loss (chronic): Secondary | ICD-10-CM | POA: Diagnosis not present

## 2019-08-02 DIAGNOSIS — Z9114 Patient's other noncompliance with medication regimen: Secondary | ICD-10-CM

## 2019-08-02 DIAGNOSIS — R456 Violent behavior: Secondary | ICD-10-CM | POA: Diagnosis present

## 2019-08-02 DIAGNOSIS — N920 Excessive and frequent menstruation with regular cycle: Secondary | ICD-10-CM | POA: Diagnosis present

## 2019-08-02 DIAGNOSIS — R451 Restlessness and agitation: Secondary | ICD-10-CM | POA: Diagnosis present

## 2019-08-02 DIAGNOSIS — F250 Schizoaffective disorder, bipolar type: Secondary | ICD-10-CM | POA: Insufficient documentation

## 2019-08-02 DIAGNOSIS — Z9119 Patient's noncompliance with other medical treatment and regimen: Secondary | ICD-10-CM

## 2019-08-02 DIAGNOSIS — Z880 Allergy status to penicillin: Secondary | ICD-10-CM

## 2019-08-02 DIAGNOSIS — D649 Anemia, unspecified: Secondary | ICD-10-CM | POA: Diagnosis not present

## 2019-08-02 DIAGNOSIS — U071 COVID-19: Principal | ICD-10-CM | POA: Diagnosis present

## 2019-08-02 DIAGNOSIS — E872 Acidosis: Secondary | ICD-10-CM | POA: Diagnosis present

## 2019-08-02 DIAGNOSIS — E860 Dehydration: Secondary | ICD-10-CM | POA: Diagnosis present

## 2019-08-02 LAB — ETHANOL: Alcohol, Ethyl (B): <10 mg/dL (ref <10)

## 2019-08-02 LAB — CBC
HCT: 19 % — ABNORMAL LOW (ref 36.0–46.0)
Hemoglobin: 4.4 g/dL — CL (ref 12.0–15.0)
MCH: 15.3 pg — ABNORMAL LOW (ref 26.0–34.0)
MCHC: 23.2 g/dL — ABNORMAL LOW (ref 30.0–36.0)
MCV: 66 fL — ABNORMAL LOW (ref 80.0–100.0)
Platelets: 663 10*3/uL — ABNORMAL HIGH (ref 150–400)
RBC: 2.88 MIL/uL — ABNORMAL LOW (ref 3.87–5.11)
RDW: 25 % — ABNORMAL HIGH (ref 11.5–15.5)
WBC: 9.4 10*3/uL (ref 4.0–10.5)
nRBC: 0.5 % — ABNORMAL HIGH (ref 0.0–0.2)

## 2019-08-02 LAB — COMPREHENSIVE METABOLIC PANEL
ALT: 7 U/L (ref 0–44)
AST: 13 U/L — ABNORMAL LOW (ref 15–41)
Albumin: 3.9 g/dL (ref 3.5–5.0)
Alkaline Phosphatase: 48 U/L (ref 38–126)
Anion gap: 12 (ref 5–15)
BUN: 11 mg/dL (ref 6–20)
CO2: 21 mmol/L — ABNORMAL LOW (ref 22–32)
Calcium: 9.5 mg/dL (ref 8.9–10.3)
Chloride: 108 mmol/L (ref 98–111)
Creatinine, Ser: 0.86 mg/dL (ref 0.44–1.00)
GFR calc Af Amer: >60 mL/min (ref >60)
GFR calc non Af Amer: >60 mL/min (ref >60)
Glucose, Bld: 127 mg/dL — ABNORMAL HIGH (ref 70–99)
Potassium: 3.9 mmol/L (ref 3.5–5.1)
Sodium: 141 mmol/L (ref 135–145)
Total Bilirubin: 0.8 mg/dL (ref 0.3–1.2)
Total Protein: 7.8 g/dL (ref 6.5–8.1)

## 2019-08-02 LAB — RAPID URINE DRUG SCREEN, HOSP PERFORMED
Amphetamines: NOT DETECTED
Barbiturates: NOT DETECTED
Benzodiazepines: NOT DETECTED
Cocaine: NOT DETECTED
Opiates: NOT DETECTED
Tetrahydrocannabinol: NOT DETECTED

## 2019-08-02 LAB — I-STAT BETA HCG BLOOD, ED (MC, WL, AP ONLY): I-stat hCG, quantitative: <5 m[IU]/mL (ref <5)

## 2019-08-02 LAB — PREPARE RBC (CROSSMATCH)

## 2019-08-02 LAB — SALICYLATE LEVEL: Salicylate Lvl: <7 mg/dL (ref 2.8–30.0)

## 2019-08-02 LAB — ACETAMINOPHEN LEVEL: Acetaminophen (Tylenol), Serum: <10 ug/mL — ABNORMAL LOW (ref 10–30)

## 2019-08-02 MED ORDER — LORAZEPAM 1 MG PO TABS: 1.0000 mg | ORAL_TABLET | ORAL | Status: DC | PRN

## 2019-08-02 MED ORDER — OLANZAPINE 5 MG PO TBDP
5.0000 mg | ORAL_TABLET | Freq: Three times a day (TID) | ORAL | Status: DC | PRN
  Filled 2019-08-02 (×2): qty 1

## 2019-08-02 MED ORDER — ZIPRASIDONE MESYLATE 20 MG IM SOLR
20.0000 mg | INTRAMUSCULAR | Status: AC | PRN
  Administered 2019-08-02: 20 mg via INTRAMUSCULAR
  Filled 2019-08-02: qty 20

## 2019-08-02 MED ORDER — SODIUM CHLORIDE 0.9 % IV SOLN: 10.0000 mL/h | Freq: Once | INTRAVENOUS | Status: DC

## 2019-08-02 MED ORDER — STERILE WATER FOR INJECTION IJ SOLN
INTRAMUSCULAR | Status: AC
  Administered 2019-08-02: 1 mL
  Filled 2019-08-02: qty 10

## 2019-08-02 NOTE — Care Management (Signed)
Under review [place], [place], [place], [place], [place], [place], [place], [place], [place], [place], [place], [place], [place], [place], [place], [place], [place], [place], [place], [place], [place], [place], [place], [place], [place], [place], [place], [place], [place], [facility], [employer], [employer], [place]

## 2019-08-02 NOTE — ED Notes (Signed)
CRITICAL VALUE STICKER  CRITICAL VALUE: Hgb 4.4  RECEIVER (on-site recipient of call): Jake T RN  [place] NOTIFIED: 08/02/19  MESSENGER (representative from lab): 2032  MD NOTIFIED: Francia Greaves, MD  TIME OF NOTIFICATION: 2034  RESPONSE: see orders

## 2019-08-02 NOTE — ED Notes (Signed)
Continues to be irritable. Demanded the phone, but we do not have a portable one her to use. She is not stable enough to use the telephone at the desk at this time.  Otherwise she is currently resting quietly in the bed. Fully dressed with legs on top of her overnight bag.

## 2019-08-02 NOTE — ED Notes (Signed)
Admitting MD at bedside.

## 2019-08-02 NOTE — ED Notes (Signed)
Blood consent process reviewed with patient. Patient refuses to sign blood consent form at this time, stating "I am not signing anything because I don't really need it." EDP made aware.

## 2019-08-02 NOTE — ED Notes (Signed)
Pt continues to refuse assessment and intervention.  She did not want her dinner tray preferring a sandwich from the refrigerator.

## 2019-08-02 NOTE — ED Notes (Signed)
Security at bedside to assist with getting patient medicated and dressed out. Patient agitated and verbally abusive at this time.

## 2019-08-02 NOTE — H&P (Signed)
History and Physical    JENNENE HEMMERICH [id number] DOB: [birth year] DOA: 08/02/2019  PCP:  I have personally briefly reviewed patient records and the ER notes.  Chief Complaint: Brought in by daughter for in voluntary weightbearing.  She has had aggressive and violent behavior at  HPI: KAYTLIN BASKINS is a 43 y.o. female with medical history significant of a history of schizophrenia comes in after having been belligerent at the house.  She was threatening people.  She is brought in for involuntary commitment.  However she is chronically anemic and is noted to have a very low hemoglobin.. Initially she would not consent to have blood however after repleted with her for 15 minutes and after much do she did sign the order. She denies any pain or shortness of breath, is quite withdrawn and does not answer questions readily. ED Course: Noted in HPI  Review of Systems: As per HPI otherwise 10 point review of systems negative.  Patient denied any other symptoms all systems were reviewed with her  Past Medical History:  Diagnosis Date  . History of radiation therapy 07/18/17-07/24/17   right ear, operative bed 12 Gy in 3 fractions  . Keloid    right ear  . Paranoid behavior ([place])   . Schizophrenia [facility])     Past Surgical History:  Procedure Laterality Date  . CESAREAN SECTION    . KENALOG INJECTION Bilateral 07/17/2017   Procedure: KENALOG INJECTION;  Surgeon: Wallace Going, DO;  Location: [place];  Service: Plastics;  Laterality: Bilateral;  . MASS EXCISION N/A 07/17/2017   Procedure: EXCISION KELOID OF RIGHT EAR WITH INTROPERATIVE KENALOG INJECTION AND POST OP RADIATION;  Surgeon: Wallace Going, DO;  Location: [place];  Service: Plastics;  Laterality: N/A;  . TUBAL LIGATION       reports that she has never smoked. She has never used smokeless tobacco. She reports current alcohol use. She reports that she does not use drugs.   Allergies  Allergen Reactions  . Penicillins Rash    Has patient had a PCN reaction causing immediate rash, facial/tongue/throat swelling, SOB or lightheadedness with hypotension:Patient refuses to answer (PRA) Has patient had a PCN reaction causing severe rash involving mucus membranes or skin necrosis:PRA Has patient had a PCN reaction that required hospitalization PRA Has patient had a PCN reaction occurring within the last 10 years:PRA If all of the above answers are "NO", then may proceed with Cephalosporin use.     Family History  Problem Relation Age of Onset  . Hypertension Other   . Diabetes Other   . Cancer Other   . Keloids Mother   . Breast cancer Maternal Grandmother     Prior to Admission medications   Not on File    Physical Exam: Vitals:   08/02/19 1439 08/02/19 1951 08/02/19 2319  BP:  132/76 (!) 103/52  Pulse:  (!) 129 (!) 102  Resp:  19 14  Temp:  (!) 100.7 F (38.2 C) 99.1 F (37.3 C)  TempSrc:  Oral Oral  SpO2:  99% 99%  Weight: 61.2 kg    Height: 5\' 7"  (1.702 m)      Constitutional: NAD.  Cooperative.  She was much more calm on my examination.  Had received a dose of Geodon.. Pallor is evident. Vitals:   08/02/19 1439 08/02/19 1951 08/02/19 2319  BP:  132/76 (!) 103/52  Pulse:  (!) 129 (!) 102  Resp:  19 14  Temp:  [name])  100.7 F (38.2 C) 99.1 F (37.3 C)  TempSrc:  Oral Oral  SpO2:  99% 99%  Weight: 61.2 kg    Height: 5\' 7"  (1.702 m)     Eyes: PERRLA. Ear nose throat: Dry mucous membranes no exudate. Neck: Flat neck veins.  No bruits. Skin: Poor turgor with facial rash and peeling. Chest: non tender Lungs: Normal and equal bilateral movements no wheezing or rhonchi. Abdomen is nontender with good bowel sounds Heart: Tachycardia is noted.  No gallops or murmurs noted Psychiatric.  Still uncooperative and rude behavior. Neurologic: Limited exam but appears to be nonfocal. Extremities: Loss of muscle mass.  No significant edema  Peripheral pulses: Diminished in the lower extremities  (Labs on Admission: I have personally reviewed following labs and imaging studies. Hemoglobin 4.4 count 6.3.  It appears to be purely iron deficiency with a low MCV of 66. Count is low. Kidney function is preserved.  AST is low.  Albumin and calcium are normal.  GFR is 81. Blood is noted in the urine however there is no other stigmata of infection..  It is not clear if she is currently having menstrual cycle  CBC: Recent Labs  Lab 08/02/19 1955  WBC 9.4  HGB 4.4*  HCT 19.0*  MCV 66.0*  PLT 0000000*   Basic Metabolic Panel: Recent Labs  Lab 08/02/19 1955  NA 141  K 3.9  CL 108  CO2 21*  GLUCOSE 127*  BUN 11  CREATININE 0.86  CALCIUM 9.5   GFR: Estimated Creatinine Clearance: 80.7 mL/min (by C-G formula based on SCr of 0.86 mg/dL). Liver Function Tests: Recent Labs  Lab 08/02/19 1955  AST 13*  ALT 7  ALKPHOS 48  BILITOT 0.8  PROT 7.8  ALBUMIN 3.9   No results for input(s): LIPASE, AMYLASE in the last 168 hours. No results for input(s): AMMONIA in the last 168 hours. Coagulation Profile: No results for input(s): INR, PROTIME in the last 168 hours. Cardiac Enzymes: No results for input(s): CKTOTAL, CKMB, CKMBINDEX, TROPONINI in the last 168 hours. BNP (last 3 results) No results for input(s): PROBNP in the last 8760 hours. HbA1C: No results for input(s): HGBA1C in the last 72 hours. CBG: No results for input(s): GLUCAP in the last 168 hours. Lipid Profile: No results for input(s): CHOL, HDL, LDLCALC, TRIG, CHOLHDL, LDLDIRECT in the last 72 hours. Thyroid Function Tests: No results for input(s): TSH, T4TOTAL, FREET4, T3FREE, THYROIDAB in the last 72 hours. Anemia Panel: No results for input(s): VITAMINB12, FOLATE, FERRITIN, TIBC, IRON, RETICCTPCT in the last 72 hours. Urine analysis:    Component Value Date/Time   COLORURINE YELLOW 12/06/2011 0034   APPEARANCEUR CLEAR 12/06/2011 0034   LABSPEC 1.021  12/06/2011 0034   PHURINE 6.0 12/06/2011 0034   GLUCOSEU NEGATIVE 12/06/2011 0034   HGBUR LARGE (A) 12/06/2011 0034   BILIRUBINUR NEGATIVE 12/06/2011 0034   KETONESUR NEGATIVE 12/06/2011 0034   PROTEINUR NEGATIVE 12/06/2011 0034   UROBILINOGEN 0.2 12/06/2011 0034   NITRITE NEGATIVE 12/06/2011 0034   LEUKOCYTESUR NEGATIVE 12/06/2011 0034    Radiological Exams on Admission: No results found.  EKG: Independently reviewed.   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  Psychiatric evaluation needs to be conducted.  DVT prophylaxis: Contraindicated due to anemia Code Status: Full Family Communication: No family present at bedside Disposition Plan: Home with self-care Consults called:  Admission status: Observation possible conversion to full admission for psychiatric treatment and evaluation   Pearlean Brownie MD [facility] Pager [phone]  If 7PM-7AM, please contact night-coverage [email or web address] Password [email or web address]  08/02/2019, 11:57 PM

## 2019-08-02 NOTE — ED Notes (Signed)
Patient changed into burgundy scrubs. Belongings collected and placed into locker number 50.

## 2019-08-02 NOTE — BH Assessment (Signed)
Assessment Note  Valerie Howard is an 43 y.o. female presenting to [facility] under IVC. She is accompanied by [employer] and her daughter, Georg Ruddle. Patient presents hostile and is uncooperative during assessment process. Patient suspicious of this assessor and did not allow TTS to speak with her daughter.  Per IVC: "Respondent has been previously diagnosed with paranoid schizophrenia. She has been prescribed medication but is noncompliant with her medication regimen. She also has a history of mental commitments. Family is greatly concerned for her safety as she refuses to seek medical help for her health issues. She is acting erratically, hostile toward family. She talks about asking someone to kill her/shoot her so she will not have to live in the state she is in anymore. Family concerned she continues to regress, refuses medical help and not taking her medications.  Per chart review, patient has a history of schizoaffective disorder, bipolar type, medication non-compliance, and numerous hospitalizations.   Diagnosis: Schizoaffective disorder, bipolar type  Past Medical History:  Past Medical History:  Diagnosis Date  . History of radiation therapy 07/18/17-07/24/17   right ear, operative bed 12 Gy in 3 fractions  . Keloid    right ear  . Paranoid behavior ([place])   . Schizophrenia [facility])     Past Surgical History:  Procedure Laterality Date  . CESAREAN SECTION    . KENALOG INJECTION Bilateral 07/17/2017   Procedure: KENALOG INJECTION;  Surgeon: Wallace Going, DO;  Location: [place];  Service: Plastics;  Laterality: Bilateral;  . MASS EXCISION N/A 07/17/2017   Procedure: EXCISION KELOID OF RIGHT EAR WITH INTROPERATIVE KENALOG INJECTION AND POST OP RADIATION;  Surgeon: Wallace Going, DO;  Location: [place];  Service: Plastics;  Laterality: N/A;  . TUBAL LIGATION      Family History:  Family History  Problem Relation Age of Onset  . Hypertension Other    . Diabetes Other   . Cancer Other   . Keloids Mother   . Breast cancer Maternal Grandmother     Social History:  reports that she has never smoked. She has never used smokeless tobacco. She reports current alcohol use. She reports that she does not use drugs.  Additional Social History:  Alcohol / Drug Use Pain Medications: see MAR Prescriptions: see MAR Over the Counter: see MAR History of alcohol / drug use?: No history of alcohol / drug abuse  CIWA: CIWA-Ar BP: (!) 145/121 Pulse Rate: (!) 137 COWS:    Allergies:  Allergies  Allergen Reactions  . Penicillins Rash    Has patient had a PCN reaction causing immediate rash, facial/tongue/throat swelling, SOB or lightheadedness with hypotension:Patient refuses to answer (PRA) Has patient had a PCN reaction causing severe rash involving mucus membranes or skin necrosis:PRA Has patient had a PCN reaction that required hospitalization PRA Has patient had a PCN reaction occurring within the last 10 years:PRA If all of the above answers are "NO", then may proceed with Cephalosporin use.     Home Medications: (Not in a hospital admission)   OB/GYN Status:  No LMP recorded.  General Assessment Data Location of Assessment: [facility] Assessment Services TTS Assessment: In system Is this a Tele or Face-to-Face Assessment?: Face-to-Face Is this an Initial Assessment or a Re-assessment for this encounter?: Initial Assessment Patient Accompanied by:: Other(daughter) Language Other than English: No Living Arrangements: (home) What gender do you identify as?: Female Marital status: Single Maiden name: Recinos Pregnancy Status: No Living Arrangements: Children Can pt return to  current living arrangement?: Yes Admission Status: Involuntary Petitioner: Family member Is patient capable of signing voluntary admission?: No Referral Source: Self/Family/Friend Insurance type: Medicaid     Crisis Care Plan Living Arrangements:  Children Legal Guardian: (self) Name of Psychiatrist: (UTA) Name of Therapist: (UTA)  Education Status Is patient currently in school?: No Is the patient employed, unemployed or receiving disability?: Receiving disability income  Risk to self with the past 6 months Suicidal Ideation: (UTA) Has patient been a risk to self within the past 6 months prior to admission? : Yes Suicidal Intent: (UTA) Has patient had any suicidal intent within the past 6 months prior to admission? : (UTA) Is patient at risk for suicide?: (UTA) Suicidal Plan?: (UTA) Has patient had any suicidal plan within the past 6 months prior to admission? : Yes Access to Means: (UTA) What has been your use of drugs/alcohol within the last 12 months?: UTA Previous Attempts/Gestures: Yes How many times?: (UTA per chart review) Other Self Harm Risks: medication noncompliance Triggers for Past Attempts: (UTA) Intentional Self Injurious Behavior: (UTA) Family Suicide History: Unable to assess Recent stressful life event(s): Other (Comment)(medication noncompliance) Persecutory voices/beliefs?: (UTA) Depression: Yes Depression Symptoms: (UTA) Substance abuse history and/or treatment for substance abuse?: No Suicide prevention information given to non-admitted patients: Not applicable  Risk to Others within the past 6 months Homicidal Ideation: (UTA) Does patient have any lifetime risk of violence toward others beyond the six months prior to admission? : Yes (comment)(per chart) Thoughts of Harm to Others: (UTA) Current Homicidal Intent: (UTA) Current Homicidal Plan: (UTA) Access to Homicidal Means: (UTA) Identified Victim: (UTA) History of harm to others?: Yes(per chart) Assessment of Violence: None Noted Violent Behavior Description: (none current) Does patient have access to weapons?: (UTA) Criminal Charges Pending?: (UTA) Does patient have a court date: (UTA) Is patient on probation?:  Unknown  Psychosis Hallucinations: (UTA) Delusions: Persecutory  Mental Status Report Appearance/Hygiene: Bizarre Eye Contact: Poor Motor Activity: Freedom of movement Speech: Aggressive Level of Consciousness: Irritable Mood: Irritable Affect: Irritable Anxiety Level: None Thought Processes: Circumstantial Judgement: Impaired Orientation: Unable to assess Obsessive Compulsive Thoughts/Behaviors: Unable to Assess  Cognitive Functioning Concentration: Unable to Assess Memory: Unable to Assess Is patient IDD: No Insight: Poor Impulse Control: Poor Appetite: (UTA) Have you had any weight changes? : (UTA) Sleep: Unable to Assess Total Hours of Sleep: (UTA) Vegetative Symptoms: Unable to Assess  ADLScreening [facility] Assessment Services) Patient's cognitive ability adequate to safely complete daily activities?: Yes Patient able to express need for assistance with ADLs?: Yes Independently performs ADLs?: Yes (appropriate for developmental age)  Prior Inpatient Therapy Prior Inpatient Therapy: Yes Prior Therapy Dates: multiple in 2020, 2018,2017 Prior Therapy Facilty/Provider(s): [facility], [place] Reason for Treatment: schizoaffective disorder  Prior Outpatient Therapy Prior Outpatient Therapy: Yes Prior Therapy Dates: 2019 Prior Therapy Facilty/Provider(s): UTA Reason for Treatment: schizoaffective Does patient have an ACCT team?: No Does patient have Intensive In-House Services?  : No Does patient have [facility] services? : No Does patient have P4CC services?: No  ADL Screening (condition at time of admission) Patient's cognitive ability adequate to safely complete daily activities?: Yes Is the patient deaf or have difficulty hearing?: No Does the patient have difficulty seeing, even when wearing glasses/contacts?: No Does the patient have difficulty concentrating, remembering, or making decisions?: Yes Patient able to express need for assistance with ADLs?: Yes Does the  patient have difficulty dressing or bathing?: No Independently performs ADLs?: Yes (appropriate for developmental age) Does the patient have difficulty  walking or climbing stairs?: No Weakness of Legs: None Weakness of Arms/Hands: None  Home Assistive Devices/Equipment Home Assistive Devices/Equipment: None  Therapy Consults (therapy consults require a physician order) PT Evaluation Needed: No OT Evalulation Needed: No SLP Evaluation Needed: No Abuse/Neglect Assessment (Assessment to be complete while patient is alone) Abuse/Neglect Assessment Can Be Completed: Unable to assess, patient is non-responsive or altered mental status   Consults Spiritual Care Consult Needed: No Social Work Consult Needed: No Regulatory affairs officer (For Healthcare) Does Patient Have a Medical Advance Directive?: Unable to assess, patient is non-responsive or altered mental status          Disposition: Priscille Loveless, PMHNP recommends in patient treatment. TTS coordinating with [facility] and provider for placement. Per PMHNP patient needs to be sent for medical clearance. Disposition Initial Assessment Completed for this Encounter: Yes Disposition of Patient: Admit Type of inpatient treatment program: Adult Patient refused recommended treatment: No  On Site Evaluation by:   Reviewed with Physician:    Orvis Brill 08/02/2019 12:30 PM

## 2019-08-02 NOTE — ED Notes (Signed)
Patient continues to sleep at this time

## 2019-08-02 NOTE — ED Notes (Signed)
Patient agreed to sign consent form for blood administration after Admitting MD spoke with her. Consent form signed and in patient's folder at nursing station.

## 2019-08-02 NOTE — ED Notes (Addendum)
Introduced self. Politely attempted to get VS and pt said, "Fuck you and fuck the doctor." She is lying in bed, dressed, with her legs thrown across an overnight bag. Disheveled and unkempt.

## 2019-08-02 NOTE — ED Triage Notes (Signed)
When the [facility] brought this pt in this writer greeted her and she said, "Fuck You". She is not willing to change clothing or participate in admission assessment.   "Respondent has been previously diagnosed with paranoid schizophrenia.  She has been prescribed medication but is non-compliant with her medication regimen.  She also has history of mental commitments.  Family is greatly concerned for her safety as she refuses to seek medical help for her health issues.  She is acting erratically, hostile towards family.  She talks about asking someone to kill her/shoot her so she will not have to live in the state she is in anymore.  Family is concerned as she continues to regress, refuse medical help and not take her medications."

## 2019-08-02 NOTE — ED Notes (Signed)
Lab called to inform that patient has antibodies so there will be a delay in getting blood ready. EDP made aware. Patient continues to sleep at this time.

## 2019-08-02 NOTE — ED Notes (Signed)
Patient stating that she refuses to get dressed out at this time because she wants to use the phone to call her children. Informed patient that she needs to be dressed out into scrubs prior to using the phone. Patient agitated at this time and yells "get the fuck out of my room right now you stupid bitch." Security called for back up at this time.

## 2019-08-02 NOTE — H&P (Signed)
Behavioral Health Medical Screening Exam  Valerie Howard is an 43 y.o. female with agitation, psychosis and paranoia. She is a level 5 caveat due to her psychiatric condition. She is refusing to talk to the Probation officer.   Total Time spent with patient: 20 minutes  Psychiatric Specialty Exam: Physical Exam  Nursing note and vitals reviewed. Constitutional: She is oriented to person, place, and time. She appears well-developed and well-nourished.  HENT:  Head: Normocephalic.  Eyes: Pupils are equal, round, and reactive to light.  Neck: Normal range of motion.  Cardiovascular:  BlE swelling   Musculoskeletal: Normal range of motion.  Neurological: She is alert and oriented to person, place, and time.  Skin:  Excoriation and desquamation. Skin is flaky and peeling on face.     ROS  Blood pressure (!) 145/121, pulse (!) 137, temperature 99.1 F (37.3 C), temperature source Oral, resp. rate 20.There is no height or weight on file to calculate BMI.  General Appearance: Disheveled and Guarded  Eye Contact:  Poor  Speech:  Pressured  Volume:  Increased  Mood:  Angry and Irritable  Affect:  Labile and Restricted  Thought Process:  Irrelevant and Descriptions of Associations: Tangential  Orientation:  Full (Time, Place, and Person)  Thought Content:  Illogical and Tangential  Suicidal Thoughts:  No  Homicidal Thoughts:  No  Memory:  Immediate;   NA Recent;   NA  Judgement:  Impaired  Insight:  Lacking  Psychomotor Activity:  Increased and psychomotor agitation  Concentration: Concentration: NA and Attention Span: NA  Recall:  NA  Fund of Knowledge:NA  Language: NA  Akathisia:  NA  Handed:  Right  AIMS (if indicated):     Assets:  Financial Resources/Insurance Housing Social Support  Sleep:       Musculoskeletal: Strength & Muscle Tone: within normal limits Gait & Station: normal Patient leans: N/A  Blood pressure (!) 145/121, pulse (!) 137, temperature 99.1 F (37.3 C),  temperature source Oral, resp. rate 20.  Recommendations:  Based on my evaluation the patient appears to have an emergency medical condition for which I recommend the patient be transferred to the emergency department for further evaluation. Will admit inpatient pending medical clearance and CoVID 19 test. Will admit for medication management and crisis stabilization. Patient did not give permission to speak with her daughter without her being present. As per IVC patient is not taking medication, and family is concerned about her safety. As per daughter she does not want mom to be released without getting proper help, they have no where else turn. " we are all concerned about her. " Will uphold IVC at this time for patient as she is refusing to talk to writer, and remains belligerant, aggressive, agitated and paranoid. She is a threat to herself and the community at this time.   Suella Broad, FNP 08/02/2019, 11:41 AM

## 2019-08-02 NOTE — ED Notes (Signed)
Pt continues to refuse interventions. She gets upset when approached but otherwise stays in her room.

## 2019-08-02 NOTE — ED Notes (Signed)
Patient resting quietly in bed with no needs at this time.

## 2019-08-02 NOTE — ED Notes (Signed)
Pt said, "If don't get a phone there are going to be some problems."

## 2019-08-02 NOTE — ED Provider Notes (Signed)
Patient seen at request of nursing staff.  Patient's lab work reveals significant anemia.  Patient with longstanding history of anemia secondary to heavy menses.  Patient will require transfusion prior to medical clearance for psychiatric placement.  Case discussed with Dr. Tonye Royalty (hospitalist service) will evaluate the patient for possible medical admission pending psychiatric placement.     Valarie Merino, MD 08/02/19 2122

## 2019-08-02 NOTE — ED Provider Notes (Signed)
[facility] DEPT Provider Note   CSN: [id number] Arrival date & time: 08/02/19  1338     History   Chief Complaint Chief Complaint  Patient presents with   Medical Clearance    HPI Valerie Howard is a 43 y.o. female.     HPI Patient arrives via transfer from behavioral health under involuntary commitment. The patient herself does not answer any questions directly, responds to essentially all queries with swearing, cursing, clear evidence for paranoid thinking, persecutory lines of thought. She cannot specify pain, her current condition, whether she takes any medication or anything else, level 5 caveat secondary to psychiatric disease. On review, the patient is noted to have gone to our behavioral health facility for initial evaluation, there she was found to have hostile, uncooperative behavior, and was sent here for medical evaluation. Past Medical History:  Diagnosis Date   History of radiation therapy 07/18/17-07/24/17   right ear, operative bed 12 Gy in 3 fractions   Keloid    right ear   Paranoid behavior ([place])    Schizophrenia ([place])     Patient Active Problem List   Diagnosis Date Noted   Schizophrenia ([place]) 03/06/2019   Anemia 02/28/2019   Acute blood loss anemia 02/27/2019   Keloid scar 07/12/2017   Schizoaffective disorder, bipolar type ([place])    Suicidal ideation    Menorrhagia 05/12/2014   Personality disorder ([place]) 10/17/2012   Anemia, iron deficiency 10/13/2012   Tachycardia 10/01/2012    Past Surgical History:  Procedure Laterality Date   CESAREAN SECTION     KENALOG INJECTION Bilateral 07/17/2017   Procedure: KENALOG INJECTION;  Surgeon: Wallace Going, DO;  Location: [place];  Service: Plastics;  Laterality: Bilateral;   MASS EXCISION N/A 07/17/2017   Procedure: EXCISION KELOID OF RIGHT EAR WITH INTROPERATIVE KENALOG INJECTION AND POST OP RADIATION;  Surgeon: Wallace Going, DO;  Location: [place];  Service: Plastics;  Laterality: N/A;   TUBAL LIGATION       OB History   No obstetric history on file.      Home Medications    Prior to Admission medications   Not on File    Family History Family History  Problem Relation Age of Onset   Hypertension Other    Diabetes Other    Cancer Other    Keloids Mother    Breast cancer Maternal Grandmother     Social History Social History   Tobacco Use   Smoking status: Never Smoker   Smokeless tobacco: Never Used  Substance Use Topics   Alcohol use: Yes    Comment: socially   Drug use: No     Allergies   Penicillins   Review of Systems Review of Systems  Unable to perform ROS: Psychiatric disorder     Physical Exam Updated Vital Signs Ht 5\' 7"  (1.702 m)    Wt 61.2 kg    BMI 21.14 kg/m   Physical Exam Vitals signs and nursing note reviewed.  Constitutional:      General: She is not in acute distress.    Appearance: She is well-developed.     Comments: Agitated adult female resting in supine position, aggressive with any direct interaction  HENT:     Head: Normocephalic and atraumatic.  Eyes:     Conjunctiva/sclera: Conjunctivae normal.  Pulmonary:     Effort: Pulmonary effort is normal. No respiratory distress.     Breath sounds: No stridor.  Abdominal:  General: There is no distension.  Skin:    General: Skin is warm and dry.  Neurological:     Mental Status: She is alert and oriented to person, place, and time.     Cranial Nerves: No cranial nerve deficit.     Comments: Patient moves all extremities spontaneously, has clear, brief speech, no facial asymmetry and there are no gross neurologic deficiencies. Patient does not interact in an agreeable management, does not participate in neurologic exam, though this is likely volitional.  Psychiatric:        Mood and Affect: Affect is labile, angry and inappropriate.        Behavior:  Behavior is agitated, aggressive and combative.        Thought Content: Thought content is paranoid and delusional.        Cognition and Memory: Cognition is impaired.      ED Treatments / Results  Labs (all labs ordered are listed, but only abnormal results are displayed) Labs Reviewed  COMPREHENSIVE METABOLIC PANEL  ETHANOL  SALICYLATE LEVEL  ACETAMINOPHEN LEVEL  CBC  RAPID URINE DRUG SCREEN, HOSP PERFORMED  I-STAT BETA HCG BLOOD, ED ([facility], [facility], [facility] ONLY)    Procedures Procedures (including critical care time)  Medications Ordered in ED Medications - No data to display   Initial Impression / Assessment and Plan / ED Course  I have reviewed the triage vital signs and the nursing notes.  Pertinent labs & imaging results that were available during my care of the patient were reviewed by me and considered in my medical decision making (see chart for details).    IVC papers suggest the patient has a history of schizophrenia, has not been taking her medication regularly has been increasingly violent, agitated, with labile behavior and temperament.  IVC papers executed by family members, given concern for the patient's ability to care for herself.    This adult female with history of schizophrenia presents under involuntary commitment with concern for labile behavior, inability to care for herself, and lack of insight into her condition. Patient is overtly physiologically in unremarkable condition, that she is not agreeable to any exam. Pending labs, Covid status, the patient is appropriate for further psychiatric care, and has already been seen by initial intake personnel.   Final Clinical Impressions(s) / ED Diagnoses   Final diagnoses:  Agitation     Carmin Muskrat, MD 08/02/19 1552

## 2019-08-03 DIAGNOSIS — E872 Acidosis: Secondary | ICD-10-CM | POA: Diagnosis present

## 2019-08-03 DIAGNOSIS — E860 Dehydration: Secondary | ICD-10-CM | POA: Diagnosis present

## 2019-08-03 DIAGNOSIS — U071 COVID-19: Secondary | ICD-10-CM | POA: Diagnosis present

## 2019-08-03 DIAGNOSIS — R451 Restlessness and agitation: Secondary | ICD-10-CM | POA: Diagnosis present

## 2019-08-03 DIAGNOSIS — R456 Violent behavior: Secondary | ICD-10-CM | POA: Diagnosis present

## 2019-08-03 DIAGNOSIS — D649 Anemia, unspecified: Secondary | ICD-10-CM | POA: Diagnosis present

## 2019-08-03 DIAGNOSIS — F250 Schizoaffective disorder, bipolar type: Secondary | ICD-10-CM | POA: Diagnosis present

## 2019-08-03 DIAGNOSIS — D500 Iron deficiency anemia secondary to blood loss (chronic): Secondary | ICD-10-CM | POA: Diagnosis present

## 2019-08-03 DIAGNOSIS — Z9119 Patient's noncompliance with other medical treatment and regimen: Secondary | ICD-10-CM | POA: Diagnosis not present

## 2019-08-03 DIAGNOSIS — N920 Excessive and frequent menstruation with regular cycle: Secondary | ICD-10-CM | POA: Diagnosis present

## 2019-08-03 DIAGNOSIS — Z880 Allergy status to penicillin: Secondary | ICD-10-CM | POA: Diagnosis not present

## 2019-08-03 DIAGNOSIS — Z9114 Patient's other noncompliance with medication regimen: Secondary | ICD-10-CM | POA: Diagnosis not present

## 2019-08-03 LAB — COMPREHENSIVE METABOLIC PANEL
ALT: 8 U/L (ref 0–44)
AST: 11 U/L — ABNORMAL LOW (ref 15–41)
Albumin: 3.4 g/dL — ABNORMAL LOW (ref 3.5–5.0)
Alkaline Phosphatase: 39 U/L (ref 38–126)
Anion gap: 7 (ref 5–15)
BUN: 13 mg/dL (ref 6–20)
CO2: 24 mmol/L (ref 22–32)
Calcium: 8.9 mg/dL (ref 8.9–10.3)
Chloride: 109 mmol/L (ref 98–111)
Creatinine, Ser: 0.84 mg/dL (ref 0.44–1.00)
GFR calc Af Amer: >60 mL/min (ref >60)
GFR calc non Af Amer: >60 mL/min (ref >60)
Glucose, Bld: 97 mg/dL (ref 70–99)
Potassium: 4.2 mmol/L (ref 3.5–5.1)
Sodium: 140 mmol/L (ref 135–145)
Total Bilirubin: 0.6 mg/dL (ref 0.3–1.2)
Total Protein: 6.6 g/dL (ref 6.5–8.1)

## 2019-08-03 LAB — CBC
HCT: 17.9 % — ABNORMAL LOW (ref 36.0–46.0)
Hemoglobin: 4.6 g/dL — CL (ref 12.0–15.0)
MCH: 17.2 pg — ABNORMAL LOW (ref 26.0–34.0)
MCHC: 25.7 g/dL — ABNORMAL LOW (ref 30.0–36.0)
MCV: 67 fL — ABNORMAL LOW (ref 80.0–100.0)
Platelets: 458 10*3/uL — ABNORMAL HIGH (ref 150–400)
RBC: 2.67 MIL/uL — ABNORMAL LOW (ref 3.87–5.11)
RDW: 26.4 % — ABNORMAL HIGH (ref 11.5–15.5)
WBC: 4.4 10*3/uL (ref 4.0–10.5)
nRBC: 0.7 % — ABNORMAL HIGH (ref 0.0–0.2)

## 2019-08-03 LAB — HEMOGLOBIN AND HEMATOCRIT, BLOOD
HCT: 26 % — ABNORMAL LOW (ref 36.0–46.0)
Hemoglobin: 7.2 g/dL — ABNORMAL LOW (ref 12.0–15.0)

## 2019-08-03 LAB — D-DIMER, QUANTITATIVE: D-Dimer, Quant: 0.5 ug/mL-FEU (ref 0.00–0.50)

## 2019-08-03 LAB — C-REACTIVE PROTEIN: CRP: 0.6 mg/dL (ref <1.0)

## 2019-08-03 LAB — LACTATE DEHYDROGENASE: LDH: 110 U/L (ref 98–192)

## 2019-08-03 LAB — SARS CORONAVIRUS 2 BY RT PCR (HOSPITAL ORDER, PERFORMED IN ~~LOC~~ HOSPITAL LAB): SARS Coronavirus 2: POSITIVE — AB

## 2019-08-03 LAB — PROCALCITONIN: Procalcitonin: <0.1 ng/mL

## 2019-08-03 MED ORDER — ACETAMINOPHEN 325 MG PO TABS
650.0000 mg | ORAL_TABLET | Freq: Four times a day (QID) | ORAL | Status: DC | PRN
  Administered 2019-08-03 – 2019-08-06 (×5): 650 mg via ORAL
  Filled 2019-08-03 (×6): qty 2

## 2019-08-03 MED ORDER — ONDANSETRON HCL 4 MG PO TABS: 4.0000 mg | ORAL_TABLET | Freq: Four times a day (QID) | ORAL | Status: DC | PRN

## 2019-08-03 MED ORDER — SODIUM CHLORIDE 0.9 % IV SOLN
100.0000 mg | Freq: Every day | INTRAVENOUS | Status: AC
  Administered 2019-08-04 – 2019-08-05 (×2): 100 mg via INTRAVENOUS
  Filled 2019-08-03: qty 20
  Filled 2019-08-03 (×2): qty 100
  Filled 2019-08-03: qty 20

## 2019-08-03 MED ORDER — ZIPRASIDONE MESYLATE 20 MG IM SOLR
10.0000 mg | Freq: Four times a day (QID) | INTRAMUSCULAR | Status: DC | PRN
  Filled 2019-08-03: qty 20

## 2019-08-03 MED ORDER — POTASSIUM CHLORIDE IN NACL 20-0.9 MEQ/L-% IV SOLN
INTRAVENOUS | Status: DC
  Administered 2019-08-03 – 2019-08-05 (×4): via INTRAVENOUS
  Filled 2019-08-03 (×5): qty 1000

## 2019-08-03 MED ORDER — SODIUM CHLORIDE 0.9 % IV SOLN
200.0000 mg | Freq: Once | INTRAVENOUS | Status: AC
  Administered 2019-08-03: 200 mg via INTRAVENOUS
  Filled 2019-08-03: qty 200

## 2019-08-03 MED ORDER — ADULT MULTIVITAMIN W/MINERALS CH
1.0000 | ORAL_TABLET | Freq: Every day | ORAL | Status: DC
  Administered 2019-08-03 – 2019-08-08 (×6): 1 via ORAL
  Filled 2019-08-03 (×6): qty 1

## 2019-08-03 MED ORDER — SODIUM CHLORIDE 0.9% IV SOLUTION
Freq: Once | INTRAVENOUS | Status: AC
  Administered 2019-08-03: 03:00:00 via INTRAVENOUS

## 2019-08-03 MED ORDER — FERROUS SULFATE 325 (65 FE) MG PO TABS
325.0000 mg | ORAL_TABLET | Freq: Two times a day (BID) | ORAL | Status: DC
  Administered 2019-08-05 – 2019-08-08 (×2): 325 mg via ORAL
  Filled 2019-08-03 (×6): qty 1

## 2019-08-03 MED ORDER — FOLIC ACID 1 MG PO TABS
1.0000 mg | ORAL_TABLET | Freq: Every day | ORAL | Status: DC
  Administered 2019-08-03 – 2019-08-08 (×6): 1 mg via ORAL
  Filled 2019-08-03 (×6): qty 1

## 2019-08-03 MED ORDER — VITAMIN B-1 100 MG PO TABS
100.0000 mg | ORAL_TABLET | Freq: Every day | ORAL | Status: DC
  Administered 2019-08-05 – 2019-08-08 (×3): 100 mg via ORAL
  Filled 2019-08-03 (×6): qty 1

## 2019-08-03 MED ORDER — SODIUM CHLORIDE 0.9% FLUSH
3.0000 mL | Freq: Two times a day (BID) | INTRAVENOUS | Status: DC
  Administered 2019-08-03 – 2019-08-08 (×8): 3 mL via INTRAVENOUS

## 2019-08-03 MED ORDER — ONDANSETRON HCL 4 MG/2ML IJ SOLN: 4.0000 mg | Freq: Four times a day (QID) | INTRAMUSCULAR | Status: DC | PRN

## 2019-08-03 NOTE — Progress Notes (Signed)
08/03/2019  Notified MD that patient temp 100.2 before giving blood. MD called back and ok to give blood.

## 2019-08-03 NOTE — Progress Notes (Signed)
08/03/2019  1839  Tried calling report. Ria Comment is the Rn getting patient. Pt going to rm# [id number].

## 2019-08-03 NOTE — Progress Notes (Addendum)
Pt transferred into Rm 1427. Upon arrival, showing signs of aggression, combative, and throwing objects. This Probation officer was greeted with "Fuck you, fuck off bitch, dont come near me and don't talk to me. It's not what you know but who you know".   Pt warns all staff not to "talk or come near to her" or they risk being "fucked up". Vital signs were refused. Pt requesting IV to be stopped but she would not allow RN to saline lock nor disconnect from IV pump. IV pump was asked to be powered down.   IV pump was shut down, no vital signs taken at this time and all meds are held. IV line may be leaking r/t the patient's aggressive movements but could not be accessed nor assessed.  Sitter left observing pt. Phones removed from room and room was taped to align with suicide precaution measures. Sitter Emmit Pomfret) was given a cell phone within the room @ [phone].

## 2019-08-03 NOTE — Progress Notes (Signed)
PROGRESS NOTE    Valerie Howard  [id number] DOB: April 02, 1976 DOA: 08/02/2019 PCP: Patient, No Pcp Per   Brief Narrative: 89 female with history of schizophrenia/paranoid behavior was brought to the ER under involuntary commitment due to belligerent, threatening behavior.  Patient was seen in the ER, lab work showed significant anemia with urine 4.4 g, MCV 66, platelets 63.,  Bicarb 21, UA/ alcohol level/ urine drug screen , Salicylate level were unremarkable.  She was noted to be febrile up to 100.7. Patient was positive on Covid RT swab testing.  Procalcitonin negative. 2 units PRBC was ordered and patient was admitted.  Subjective:  Seen this morning, on one-to-one.  Alert awake oriented x3, denies any suicidal ideation homicidal ideation.  Appears calm and collected.  sessment & Plan:  Sepsis POA/Covid 19 infection:  with fever 100.7, heart rate 113, UA unremarkable. patient remains hospitalized we will continue remdesivir pharmacy to dose.  Patient is currently not hypoxic-holding off on steroid. Cont Tylenol as needed.Monitor.  Monitor inflammatory markers.    Schizoaffective disorder, bipolar type with agitation and belligerent behavior, under involuntary commitment )reviewed paperwork 08/02/19- expires in 3 days)/ currently aAlert awake oriented x3, redirectable, communicative and interactive.  Continue plan of care, discussed with ER behavioral team who are following. Psych will consulted once patient in the floor.  Currently in the ED holding room.  Continue as needed ativan and zyprexa.  Severe Anemia, microcytic: History of chronic anemia with severe iron deficiency/menorrhagia- previous hemoglobin back in July 2020 was  5.2-needing 2 unit prbc and hp upto 8.8 gm.unable to do iron panel as patient has already received on a blood transfusion and due to get abdominal pathology check H&H/ Suspect iron deficiency  and likely worsened by her Covid infection.  Will need outpatient  follow-up with hematology. Add iron supplement orally.  Metabolic acidosis: likely due to dehydration/agitation.  Increase oral intake.  Body mass index is 21.14 kg/m.    DVT prophylaxis: SCD Code Status: full Family Communication: plan of care discussed with patient at bedside. Disposition Plan: Change patient to inpatient status given need for 2 midnight stay due to need for blood transfusion, and for treatment of her Covid infection/fever.   Consultants: psych  Procedures:none Microbiology:covid 19 + Antimicrobials: Anti-infectives (From admission, onward)   Start     Dose/Rate Route Frequency Ordered Stop   08/04/19 1000  remdesivir 100 mg in sodium chloride 0.9 % 100 mL IVPB     100 mg 200 mL/hr over 30 Minutes Intravenous Daily 08/03/19 0745 08/08/19 0959   08/03/19 0900  remdesivir 200 mg in sodium chloride 0.9% 250 mL IVPB     200 mg 580 mL/hr over 30 Minutes Intravenous Once 08/03/19 0745 08/03/19 0954       Objective: Vitals:   08/03/19 1028 08/03/19 1054 08/03/19 1156 08/03/19 1243  BP:  99/69 129/75 124/71  Pulse:  99 (!) 112 99  Resp:  16 18 20   Temp: (!) 100.5 F (38.1 C) 100 F (37.8 C) (!) 100.5 F (38.1 C) 99.3 F (37.4 C)  TempSrc: Oral Oral Oral Oral  SpO2:  100% 100% 100%  Weight:      Height:        Intake/Output Summary (Last 24 hours) at 08/03/2019 1259 Last data filed at 08/03/2019 1244 Gross per 24 hour  Intake 1589.5 ml  Output -  Net 1589.5 ml   Filed Weights   08/02/19 1439  Weight: 61.2 kg   Weight change:  Body mass index is 21.14 kg/m.  Intake/Output from previous day: 12/04 0701 - 12/05 0700 In: 605 [P.O.:240; I.V.:40; Blood:325] Out: -  Intake/Output this shift: Total I/O In: 984.5 [P.O.:360; I.V.:30; Blood:304.5; IV Piggyback:290] Out: -   Examination:  General exam: AAOx3,NAD, Weak appearing. HEENT:Oral mucosa moist, Ear/Nose WNL grossly, dentition normal. Respiratory system: Diminished at the base,no  wheezing or crackles,no use of accessory muscle Cardiovascular system: S1 & S2 +, No JVD,. Gastrointestinal system: Abdomen soft, NT,ND, BS+ Nervous System:Alert, awake, moving extremities and grossly nonfocal Extremities: No edema, distal peripheral pulses palpable.  Skin: No rashes,no icterus. MSK: Normal muscle bulk,tone, power  Medications:  Scheduled Meds: . folic acid  1 mg Oral Daily  . multivitamin with minerals  1 tablet Oral Daily  . sodium chloride flush  3 mL Intravenous Q12H  . thiamine  100 mg Oral Daily   Continuous Infusions: . sodium chloride    . 0.9 % NaCl with KCl 20 mEq / L 50 mL/hr at 08/03/19 1250  . [START ON 08/04/2019] remdesivir 100 mg in NS 100 mL      Data Reviewed: I have personally reviewed following labs and imaging studies  CBC: Recent Labs  Lab 08/02/19 1955 08/03/19 0526  WBC 9.4 4.4  HGB 4.4* 4.6*  HCT 19.0* 17.9*  MCV 66.0* 67.0*  PLT 663* [id number]*   Basic Metabolic Panel: Recent Labs  Lab 08/02/19 1955 08/03/19 0526  NA 141 140  K 3.9 4.2  CL 108 109  CO2 21* 24  GLUCOSE 127* 97  BUN 11 13  CREATININE 0.86 0.84  CALCIUM 9.5 8.9   GFR: Estimated Creatinine Clearance: 82.6 mL/min (by C-G formula based on SCr of 0.84 mg/dL). Liver Function Tests: Recent Labs  Lab 08/02/19 1955 08/03/19 0526  AST 13* 11*  ALT 7 8  ALKPHOS 48 39  BILITOT 0.8 0.6  PROT 7.8 6.6  ALBUMIN 3.9 3.4*   No results for input(s): LIPASE, AMYLASE in the last 168 hours. No results for input(s): AMMONIA in the last 168 hours. Coagulation Profile: No results for input(s): INR, PROTIME in the last 168 hours. Cardiac Enzymes: No results for input(s): CKTOTAL, CKMB, CKMBINDEX, TROPONINI in the last 168 hours. BNP (last 3 results) No results for input(s): PROBNP in the last 8760 hours. HbA1C: No results for input(s): HGBA1C in the last 72 hours. CBG: No results for input(s): GLUCAP in the last 168 hours. Lipid Profile: No results for input(s):  CHOL, HDL, LDLCALC, TRIG, CHOLHDL, LDLDIRECT in the last 72 hours. Thyroid Function Tests: No results for input(s): TSH, T4TOTAL, FREET4, T3FREE, THYROIDAB in the last 72 hours. Anemia Panel: No results for input(s): VITAMINB12, FOLATE, FERRITIN, TIBC, IRON, RETICCTPCT in the last 72 hours. Sepsis Labs: Recent Labs  Lab 08/03/19 0526  PROCALCITON <0.10    Recent Results (from the past 240 hour(s))  SARS Coronavirus 2 by RT PCR (hospital order, performed in [facility] hospital lab) Nasopharyngeal Nasopharyngeal Swab     Status: Abnormal   Collection Time: 08/02/19  7:55 PM   Specimen: Nasopharyngeal Swab  Result Value Ref Range Status   SARS Coronavirus 2 POSITIVE (A) NEGATIVE Final    Comment: RESULT CALLED TO, READ BACK BY AND VERIFIED WITH: [employer] AT 0027 ON 08/03/19 BY A,MOHAMED (NOTE) SARS-CoV-2 target nucleic acids are DETECTED SARS-CoV-2 RNA is generally detectable in upper respiratory specimens  during the acute phase of infection.  Positive results are indicative  of the presence of the identified virus, but do  not rule out bacterial infection or co-infection with other pathogens not detected by the test.  Clinical correlation with patient history and  other diagnostic information is necessary to determine patient infection status.  The expected result is negative. Fact Sheet for Patients:   StrictlyIdeas.no  Fact Sheet for Healthcare Providers:   BankingDealers.co.za   This test is not yet approved or cleared by the [place] FDA and  has been authorized for detection and/or diagnosis of SARS-CoV-2 by FDA under an Emergency Use Authorization (EUA).  This EUA will remain in effect (meaning thi s test can be used) for the duration of  the COVID-19 declaration under Section 564(b)(1) of the Act, 21 U.S.C. section 360-bbb-3(b)(1), unless the authorization is terminated or revoked sooner. Performed at [facility], [place] [address], [place], [place] [address]       Radiology Studies: No results found.    LOS: 0 days   Time spent: More than 50% of that time was spent in counseling and/or coordination of care.  Antonieta Pert, MD [facility]  08/03/2019, 12:59 PM

## 2019-08-03 NOTE — ED Notes (Signed)
Hospitalist paged about temperature.

## 2019-08-03 NOTE — ED Notes (Signed)
Patient ambulatory to restroom with no assistance and steady gait.  

## 2019-08-04 DIAGNOSIS — Z9114 Patient's other noncompliance with medication regimen: Secondary | ICD-10-CM

## 2019-08-04 DIAGNOSIS — R451 Restlessness and agitation: Secondary | ICD-10-CM

## 2019-08-04 DIAGNOSIS — F250 Schizoaffective disorder, bipolar type: Secondary | ICD-10-CM

## 2019-08-04 MED ORDER — OLANZAPINE 5 MG PO TBDP
10.0000 mg | ORAL_TABLET | Freq: Three times a day (TID) | ORAL | Status: DC | PRN
  Filled 2019-08-04: qty 2

## 2019-08-04 MED ORDER — OLANZAPINE 5 MG PO TBDP
10.0000 mg | ORAL_TABLET | Freq: Every day | ORAL | Status: DC
  Filled 2019-08-04 (×5): qty 2

## 2019-08-04 MED ORDER — LORAZEPAM 1 MG PO TABS: 1.0000 mg | ORAL_TABLET | ORAL | Status: DC | PRN

## 2019-08-04 MED ORDER — HYDROXYZINE HCL 25 MG PO TABS: 25.0000 mg | ORAL_TABLET | Freq: Three times a day (TID) | ORAL | Status: DC | PRN

## 2019-08-04 NOTE — Consult Note (Addendum)
Telepsych Consultation   Reason for Consult: ''Hallucinations'' Referring Physician: Antonieta Pert, MD Location of Patient: [phone] Location of Provider: [facility]  Patient Identification: Valerie Howard MRN:  [id number] Principal Diagnosis: Schizoaffective disorder, bipolar type ([place]) Diagnosis:  Principal Problem:   Schizoaffective disorder, bipolar type ([place]) Active Problems:   Anemia   Symptomatic anemia   Total Time spent with patient: 45 minutes  Subjective:   Valerie Howard is a 43 y.o. female patient admitted with agitation and hallucination  HPI: Valerie Howard is an 43 y.o. female who is well known to psychiatric service because of multiple ED visits and psychiatric inpatient admissions due to non-complaint with medications and follow up care. Patient has a long history of Schizoaffective disorder-Bipolar type and paranoid schizophrenia. Patient refused to participate in psychiatric evaluation. However, chart review revealed that patient was brought to the hospital by [employer] and her daughter under IVC paper work. Patient reportedly presented hostile and is uncooperative. Today, patient still refused to participate in psychiatric assessment. She is defiant, hostile, uncooperative, verbally abusive, paranoid and observed talking to herself as if responding to internal stimuli.   Per IVC: "Respondent has been previously diagnosed with paranoid schizophrenia. She has been prescribed medication but is noncompliant with her medication regimen. She also has a history of mental commitments. Family is greatly concerned for her safety as she refuses to seek medical help for her health issues. She is acting erratically, hostile toward family. She talks about asking someone to kill her/shoot her so she will not have to live in the state she is in anymore. Family concerned she continues to regress, refuses medical help and not taking her medications''.  Past Psychiatric History:  as above  Risk to Self:   patient refused to answer Risk to Others:  patient refused to answer Prior Inpatient Therapy:  multiple times in the past Prior Outpatient Therapy:  patient is non-compliant  Past Medical History:  Past Medical History:  Diagnosis Date  . History of radiation therapy 07/18/17-07/24/17   right ear, operative bed 12 Gy in 3 fractions  . Keloid    right ear  . Paranoid behavior ([place])   . Schizophrenia [facility])     Past Surgical History:  Procedure Laterality Date  . CESAREAN SECTION    . KENALOG INJECTION Bilateral 07/17/2017   Procedure: KENALOG INJECTION;  Surgeon: Wallace Going, DO;  Location: [place];  Service: Plastics;  Laterality: Bilateral;  . MASS EXCISION N/A 07/17/2017   Procedure: EXCISION KELOID OF RIGHT EAR WITH INTROPERATIVE KENALOG INJECTION AND POST OP RADIATION;  Surgeon: Wallace Going, DO;  Location: [place];  Service: Plastics;  Laterality: N/A;  . TUBAL LIGATION     Family History:  Family History  Problem Relation Age of Onset  . Hypertension Other   . Diabetes Other   . Cancer Other   . Keloids Mother   . Breast cancer Maternal Grandmother    Family Psychiatric  History: Social History:  Social History   Substance and Sexual Activity  Alcohol Use Yes   Comment: socially     Social History   Substance and Sexual Activity  Drug Use No    Social History   Socioeconomic History  . Marital status: Divorced    Spouse name: Not on file  . Number of children: 4  . Years of education: Not on file  . Highest education level: Not on file  Occupational History  . Occupation:  unemployed  Social Needs  . Financial resource strain: Not on file  . Food insecurity    Worry: Not on file    Inability: Not on file  . Transportation needs    Medical: Not on file    Non-medical: Not on file  Tobacco Use  . Smoking status: Never Smoker  . Smokeless tobacco: Never Used  Substance  and Sexual Activity  . Alcohol use: Yes    Comment: socially  . Drug use: No  . Sexual activity: Not Currently    Birth control/protection: Surgical  Lifestyle  . Physical activity    Days per week: Not on file    Minutes per session: Not on file  . Stress: Not on file  Relationships  . Social Herbalist on phone: Not on file    Gets together: Not on file    Attends religious service: Not on file    Active member of club or organization: Not on file    Attends meetings of clubs or organizations: Not on file    Relationship status: Not on file  Other Topics Concern  . Not on file  Social History Narrative  . Not on file   Additional Social History:    Allergies:   Allergies  Allergen Reactions  . Penicillins Rash    Has patient had a PCN reaction causing immediate rash, facial/tongue/throat swelling, SOB or lightheadedness with hypotension:Patient refuses to answer (PRA) Has patient had a PCN reaction causing severe rash involving mucus membranes or skin necrosis:PRA Has patient had a PCN reaction that required hospitalization PRA Has patient had a PCN reaction occurring within the last 10 years:PRA If all of the above answers are "NO", then may proceed with Cephalosporin use.     Labs:  Results for orders placed or performed during the hospital encounter of 08/02/19 (from the past 48 hour(s))  Rapid urine drug screen (hospital performed)     Status: None   Collection Time: 08/02/19  4:01 PM  Result Value Ref Range   Opiates NONE DETECTED NONE DETECTED   Cocaine NONE DETECTED NONE DETECTED   Benzodiazepines NONE DETECTED NONE DETECTED   Amphetamines NONE DETECTED NONE DETECTED   Tetrahydrocannabinol NONE DETECTED NONE DETECTED   Barbiturates NONE DETECTED NONE DETECTED    Comment: (NOTE) DRUG SCREEN FOR MEDICAL PURPOSES ONLY.  IF CONFIRMATION IS NEEDED FOR ANY PURPOSE, NOTIFY LAB WITHIN 5 DAYS. LOWEST DETECTABLE LIMITS FOR URINE DRUG SCREEN Drug Class                      Cutoff (ng/mL) Amphetamine and metabolites    1000 Barbiturate and metabolites    200 Benzodiazepine                 [id number] Tricyclics and metabolites     300 Opiates and metabolites        300 Cocaine and metabolites        300 THC                            50 Performed at [facility], [place] [address] [place], [place] [address]   Comprehensive metabolic panel     Status: Abnormal   Collection Time: 08/02/19  7:55 PM  Result Value Ref Range   Sodium 141 135 - 145 mmol/L   Potassium 3.9 3.5 - 5.1 mmol/L   Chloride 108 98 - 111 mmol/L  CO2 21 (L) 22 - 32 mmol/L   Glucose, Bld 127 (H) 70 - 99 mg/dL   BUN 11 6 - 20 mg/dL   Creatinine, Ser 0.86 0.44 - 1.00 mg/dL   Calcium 9.5 8.9 - 10.3 mg/dL   Total Protein 7.8 6.5 - 8.1 g/dL   Albumin 3.9 3.5 - 5.0 g/dL   AST 13 (L) 15 - 41 U/L   ALT 7 0 - 44 U/L   Alkaline Phosphatase 48 38 - 126 U/L   Total Bilirubin 0.8 0.3 - 1.2 mg/dL   GFR calc non Af Amer >60 >60 mL/min   GFR calc Af Amer >60 >60 mL/min   Anion gap 12 5 - 15    Comment: Performed at [facility], [place] [address], [place], [place] [address]  Ethanol     Status: None   Collection Time: 08/02/19  7:55 PM  Result Value Ref Range   Alcohol, Ethyl (B) <10 <10 mg/dL    Comment: (NOTE) Lowest detectable limit for serum alcohol is 10 mg/dL. For medical purposes only. Performed at [facility], [place] [address] [place], [place] [id number]   Salicylate level     Status: None   Collection Time: 08/02/19  7:55 PM  Result Value Ref Range   Salicylate Lvl [id number] 2.8 - 30.0 mg/dL    Comment: Performed at [facility], [place] [address], [place], [place] [address]  Acetaminophen level     Status: Abnormal   Collection Time: 08/02/19  7:55 PM  Result Value Ref Range   Acetaminophen (Tylenol), Serum <10 (L) 10 - 30 ug/mL    Comment: (NOTE) Therapeutic concentrations vary significantly. A range  of 10-30 ug/mL  may be an effective concentration for many patients. However, some  are best treated at concentrations outside of this range. Acetaminophen concentrations >150 ug/mL at 4 hours after ingestion  and >50 ug/mL at 12 hours after ingestion are often associated with  toxic reactions. Performed at [facility], [place] [address], [place], [place] [address]   cbc     Status: Abnormal   Collection Time: 08/02/19  7:55 PM  Result Value Ref Range   WBC 9.4 4.0 - 10.5 K/uL   RBC 2.88 (L) 3.87 - 5.11 MIL/uL   Hemoglobin 4.4 (LL) 12.0 - 15.0 g/dL    Comment: REPEATED TO VERIFY Reticulocyte Hemoglobin testing may be clinically indicated, consider ordering this additional test [id number] THIS CRITICAL RESULT HAS VERIFIED AND BEEN CALLED TO J.[place] BY NATHAN THOMPSON ON 12 04 2020 AT 2028, AND HAS BEEN READ BACK. CRITICAL RESULT VERIFIED    HCT 19.0 (L) 36.0 - 46.0 %   MCV 66.0 (L) 80.0 - 100.0 fL   MCH 15.3 (L) 26.0 - 34.0 pg   MCHC 23.2 (L) 30.0 - 36.0 g/dL   RDW 25.0 (H) 11.5 - 15.5 %   Platelets 663 (H) 150 - 400 K/uL   nRBC 0.5 (H) 0.0 - 0.2 %    Comment: Performed at [facility], [place] [address] [place], [place] [address]  SARS Coronavirus 2 by RT PCR (hospital order, performed in [facility] hospital lab) Nasopharyngeal Nasopharyngeal Swab     Status: Abnormal   Collection Time: 08/02/19  7:55 PM   Specimen: Nasopharyngeal Swab  Result Value Ref Range   SARS Coronavirus 2 POSITIVE (A) NEGATIVE    Comment: RESULT CALLED TO, READ BACK BY AND VERIFIED WITH: [employer] AT 0027 ON 08/03/19 BY  A,MOHAMED (NOTE) SARS-CoV-2 target nucleic acids are DETECTED SARS-CoV-2 RNA is generally detectable in upper respiratory specimens  during the acute phase of infection.  Positive results are indicative  of the presence of the identified virus, but do not rule out bacterial infection or co-infection with other pathogens not detected by the  test.  Clinical correlation with patient history and  other diagnostic information is necessary to determine patient infection status.  The expected result is negative. Fact Sheet for Patients:   StrictlyIdeas.no  Fact Sheet for Healthcare Providers:   BankingDealers.co.za   This test is not yet approved or cleared by the [place] FDA and  has been authorized for detection and/or diagnosis of SARS-CoV-2 by FDA under an Emergency Use Authorization (EUA).  This EUA will remain in effect (meaning thi s test can be used) for the duration of  the COVID-19 declaration under Section 564(b)(1) of the Act, 21 U.S.C. section 360-bbb-3(b)(1), unless the authorization is terminated or revoked sooner. Performed at [facility], [place] [address], [place], [place] [address]   C-reactive protein     Status: None   Collection Time: 08/02/19  7:55 PM  Result Value Ref Range   CRP 0.6 <1.0 mg/dL    Comment: Performed at [facility], [place] [address], [place], [place] [address]  I-Stat beta hCG blood, ED     Status: None   Collection Time: 08/02/19  8:01 PM  Result Value Ref Range   I-stat hCG, quantitative <5.0 <5 mIU/mL   Comment 3            Comment:   GEST. AGE      CONC.  (mIU/mL)   <=1 WEEK        5 - 50     2 WEEKS       50 - 500     3 WEEKS       100 - 10,000     4 WEEKS     1,000 - 30,000        FEMALE AND NON-PREGNANT FEMALE:     LESS THAN 5 mIU/mL   Type and screen [place]     Status: None (Preliminary result)   Collection Time: 08/02/19  8:37 PM  Result Value Ref Range   ABO/RH(D) O POS    Antibody Screen POS    Sample Expiration 08/05/2019,2359    Antibody Identification ANTI E    DAT, IgG NEG    PT AG Type NEGATIVE FOR E ANTIGEN    Unit Number [id number]    Blood Component Type RED CELLS,LR    Unit division 00    Status of Unit ISSUED    Donor AG Type NEGATIVE FOR  E ANTIGEN    Transfusion Status OK TO TRANSFUSE    Crossmatch Result COMPATIBLE    Unit Number [id number]    Blood Component Type RED CELLS,LR    Unit division 00    Status of Unit ISSUED    Donor AG Type NEGATIVE FOR E ANTIGEN    Transfusion Status OK TO TRANSFUSE    Crossmatch Result COMPATIBLE   Prepare RBC     Status: None   Collection Time: 08/02/19  8:37 PM  Result Value Ref Range   Order Confirmation      ORDER PROCESSED BY BLOOD BANK Performed at [facility], [place] [address], [place], [place] [address]   Comprehensive metabolic panel     Status: Abnormal  Collection Time: 08/03/19  5:26 AM  Result Value Ref Range   Sodium 140 135 - 145 mmol/L   Potassium 4.2 3.5 - 5.1 mmol/L   Chloride 109 98 - 111 mmol/L   CO2 24 22 - 32 mmol/L   Glucose, Bld 97 70 - 99 mg/dL   BUN 13 6 - 20 mg/dL   Creatinine, Ser 0.84 0.44 - 1.00 mg/dL   Calcium 8.9 8.9 - 10.3 mg/dL   Total Protein 6.6 6.5 - 8.1 g/dL   Albumin 3.4 (L) 3.5 - 5.0 g/dL   AST 11 (L) 15 - 41 U/L   ALT 8 0 - 44 U/L   Alkaline Phosphatase 39 38 - 126 U/L   Total Bilirubin 0.6 0.3 - 1.2 mg/dL   GFR calc non Af Amer >60 >60 mL/min   GFR calc Af Amer >60 >60 mL/min   Anion gap 7 5 - 15    Comment: Performed at [facility], [place] [address], [place], [place] [address]  CBC     Status: Abnormal   Collection Time: 08/03/19  5:26 AM  Result Value Ref Range   WBC 4.4 4.0 - 10.5 K/uL   RBC 2.67 (L) 3.87 - 5.11 MIL/uL   Hemoglobin 4.6 (LL) 12.0 - 15.0 g/dL    Comment: REPEATED TO VERIFY Reticulocyte Hemoglobin testing may be clinically indicated, consider ordering this additional test [id number] THIS CRITICAL RESULT HAS VERIFIED AND BEEN CALLED TO HAKEM, RN BY LISA EDWARDS ON 12 05 2020 AT 0646, AND HAS BEEN READ BACK.     HCT 17.9 (L) 36.0 - 46.0 %   MCV 67.0 (L) 80.0 - 100.0 fL   MCH 17.2 (L) 26.0 - 34.0 pg   MCHC 25.7 (L) 30.0 - 36.0 g/dL   RDW 26.4 (H) 11.5 - 15.5 %    Platelets 458 (H) 150 - 400 K/uL   nRBC 0.7 (H) 0.0 - 0.2 %    Comment: Performed at [facility], [place] [address], [place], [place] [address]  D-dimer, quantitative (not at [facility])     Status: None   Collection Time: 08/03/19  5:26 AM  Result Value Ref Range   D-Dimer, Quant 0.50 0.00 - 0.50 ug/mL-FEU    Comment: (NOTE) At the manufacturer cut-off of 0.50 ug/mL FEU, this assay has been documented to exclude PE with a sensitivity and negative predictive value of 97 to 99%.  At this time, this assay has not been approved by the FDA to exclude DVT/VTE. Results should be correlated with clinical presentation. Performed at [facility], [place] [address], [place], [place] [address]   Lactate dehydrogenase     Status: None   Collection Time: 08/03/19  5:26 AM  Result Value Ref Range   LDH 110 98 - 192 U/L    Comment: Performed at [facility], [place] [address] [place], [place] [address]  Procalcitonin     Status: None   Collection Time: 08/03/19  5:26 AM  Result Value Ref Range   Procalcitonin <0.10 ng/mL    Comment:        Interpretation: PCT (Procalcitonin) <= 0.5 ng/mL: Systemic infection (sepsis) is not likely. Local bacterial infection is possible. (NOTE)       Sepsis PCT Algorithm           Lower Respiratory Tract  Infection PCT Algorithm    ----------------------------     ----------------------------         PCT < 0.25 ng/mL                PCT < 0.10 ng/mL         Strongly encourage             Strongly discourage   discontinuation of antibiotics    initiation of antibiotics    ----------------------------     -----------------------------       PCT 0.25 - 0.50 ng/mL            PCT 0.10 - 0.25 ng/mL               OR       >80% decrease in PCT            Discourage initiation of                                            antibiotics      Encourage discontinuation           of  antibiotics    ----------------------------     -----------------------------         PCT >= 0.50 ng/mL              PCT 0.26 - 0.50 ng/mL               AND        <80% decrease in PCT             Encourage initiation of                                             antibiotics       Encourage continuation           of antibiotics    ----------------------------     -----------------------------        PCT >= 0.50 ng/mL                  PCT > 0.50 ng/mL               AND         increase in PCT                  Strongly encourage                                      initiation of antibiotics    Strongly encourage escalation           of antibiotics                                     -----------------------------                                           PCT <= 0.25 ng/mL  OR                                        > 80% decrease in PCT                                     Discontinue / Do not initiate                                             antibiotics Performed at [place] [address] [place], [place] [address]   Hemoglobin and hematocrit, blood     Status: Abnormal   Collection Time: 08/03/19  3:28 PM  Result Value Ref Range   Hemoglobin 7.2 (L) 12.0 - 15.0 g/dL   HCT 26.0 (L) 36.0 - 46.0 %    Comment: Performed at [facility], [place] [address] [place],  [address]    Medications:  Current Facility-Administered Medications  Medication Dose Route Frequency Provider Last Rate Last Dose  . 0.9 %  sodium chloride infusion  10 mL/hr Intravenous Once Dene Gentry C, MD      . 0.9 % NaCl with KCl 20 mEq/ L  infusion   Intravenous Continuous Pearlean Brownie, MD 50 mL/hr at 08/04/19 0002    . acetaminophen (TYLENOL) tablet 650 mg  650 mg Oral Q6H PRN Antonieta Pert, MD   650 mg at 08/03/19 2147  . ferrous sulfate tablet 325 mg  325 mg Oral BID WC Kc, Ramesh, MD      . folic acid (FOLVITE)  tablet 1 mg  1 mg Oral Daily Pearlean Brownie, MD   1 mg at 08/04/19 1005  . OLANZapine zydis (ZYPREXA) disintegrating tablet 10 mg  10 mg Oral Q8H PRN Corena Pilgrim, MD       And  . LORazepam (ATIVAN) tablet 1 mg  1 mg Oral PRN Mabell Esguerra, MD      . multivitamin with minerals tablet 1 tablet  1 tablet Oral Daily Pearlean Brownie, MD   1 tablet at 08/04/19 1005  . OLANZapine zydis (ZYPREXA) disintegrating tablet 10 mg  10 mg Oral QHS Rovena Hearld, MD      . ondansetron (ZOFRAN) tablet 4 mg  4 mg Oral Q6H PRN Pearlean Brownie, MD       Or  . ondansetron (ZOFRAN) injection 4 mg  4 mg Intravenous Q6H PRN Pearlean Brownie, MD      . remdesivir 100 mg in sodium chloride 0.9 % 100 mL IVPB  100 mg Intravenous Daily Green, Terri L, RPH 200 mL/hr at 08/04/19 0954 100 mg at 08/04/19 0954  . sodium chloride flush (NS) 0.9 % injection 3 mL  3 mL Intravenous Q12H Pearlean Brownie, MD   3 mL at 08/03/19 2136  . thiamine (VITAMIN B-1) tablet 100 mg  100 mg Oral Daily Haq, Albertine Grates T, MD      . ziprasidone (GEODON) injection 10 mg  10 mg Intramuscular Q6H PRN Pearlean Brownie, MD        Musculoskeletal: Strength & Muscle Tone: not tested Gait & Station: normal Patient leans: N/A  Psychiatric Specialty Exam: Physical Exam  Psychiatric: Her affect  is labile. Her speech is rapid and/or pressured. She is agitated and actively hallucinating. Thought content is paranoid and delusional. Cognition and memory are normal. She expresses impulsivity.    Review of Systems  Constitutional: Negative.   HENT: Negative.   Eyes: Negative.   Cardiovascular: Negative.   Gastrointestinal: Negative.   Skin: Negative.   Endo/Heme/Allergies: Negative.   Psychiatric/Behavioral: Positive for hallucinations.    Blood pressure (!) 141/98, pulse 95, temperature 99.3 F (37.4 C), temperature source Oral, resp. rate 20, height 5\' 7"  (1.702 m), weight 61.2 kg, SpO2 100 %.Body mass index is 21.14 kg/m.  General Appearance: Casual  Eye  Contact:  Minimal  Speech:  Clear and Coherent and Pressured  Volume:  Increased  Mood:  Irritable  Affect:  Labile  Thought Process:  Coherent  Orientation:  Full (Time, Place, and Person)  Thought Content:  Hallucinations: Auditory and Paranoid Ideation  Suicidal Thoughts:  unable to assess, patient refused to answer  Homicidal Thoughts:  unable to assess, patient refused to answer  Memory:  unable to assess, patient uncooperative  Judgement:  Poor  Insight:  Lacking  Psychomotor Activity:  Increased  Concentration:  Concentration: Fair and Attention Span: Fair  Recall:  unable to assess, patient uncooperative  Fund of Knowledge:  unable to assess, patient uncooperative  Language:  Good  Akathisia:  No  Handed:  Right  AIMS (if indicated):     Assets:  Social Support Others:  family  ADL's:  Intact  Cognition:  WNL  Sleep:   poor     Treatment Plan Summary: 43 year old female with long history of mental illness and non-compliant with treatment/medications. She was brought to ED under IVC due to increased aggression, hallucinations and violent behavior. Patient was found to be positive for Covid-19 and as a result, she was admitted to [facility] instead of psychiatric hospital. However, patient will benefit from inpatient psychiatric admission for stabilization after she is medically cleared.  Recommendations: -Increase Zyprexa Zydis to 10 mg q8h prn for agitation -Add Zyprexa Zydis 10 mg daily at bedtime for psychosis/agitation -Add Hydroxyzine 25 mg q8h prn for anxiety/EPS Prevention. -Consider social worker consult to facilitate psychiatric inpatient placement after patient is medically cleared.  Disposition: Recommend psychiatric Inpatient admission when medically cleared. Psychiatric service siging out. Re-consult as needed  This service was provided via telemedicine using a 2-way, interactive audio and video technology.  Names of all persons participating  in this telemedicine service and their role in this encounter. Name: Jasmeet Gouch Role: Patient  Name: Corena Pilgrim Role: Psychiatrist  Name: April Role: RN  Name:  Role:     Corena Pilgrim, MD 08/04/2019 12:01 PM

## 2019-08-04 NOTE — Plan of Care (Signed)
Pt has removed IV site. States "it was acting funny". Pt educated about proper removal and notifying a nurse. 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  Pt is aggressive, combative and throwing objects like trash, paper and having auditory hallucinations. States that" I had better tell those motherfuckers to stop the clicking sound in her finger that we put there" and talking at TV.

## 2019-08-04 NOTE — Progress Notes (Signed)
PROGRESS NOTE    Valerie Howard  [id number] DOB: 04-Jun-1976 DOA: 08/02/2019 PCP: Patient, No Pcp Per   Brief Narrative: 34 female with history of schizophrenia/paranoid behavior was brought to the ER under involuntary commitment due to belligerent, threatening behavior.  Patient was seen in the ER, lab work showed significant anemia with urine 4.4 g, MCV 66, platelets 63.,  Bicarb 21, UA/ alcohol level/ urine drug screen , Salicylate level were unremarkable.  She was noted to be febrile up to 100.7. Patient was positive on Covid RT swab testing.  Procalcitonin negative. 2 units PRBC was ordered and patient was admitted.  Subjective:  This morning patient is alert awake oriented x2 denies suicidal ideation homicidal ideation.  Nursing reports patient was having auditory hallucination, intermittently agitated.  sessment & Plan:  Sepsis POA/Covid 19 infection:  with fever 100.7, heart rate 113, UA unremarkable. patient remains hospitalized we will continue remdesivir pharmacy to dose.  Patient is hemodynamically stable, no more fever more than 24 hours.  Patient is currently not hypoxic-holding off on steroid. Cont Tylenol as needed.Monitor.  Monitor inflammatory markers.     Psych issues:Schizoaffective disorder, bipolar type with agitation and belligerent behavior, under involuntary commitment )reviewed paperwork 08/02/19- expires in 3 days)/ currently Alert awake oriented x3, denies SI/suicidal ideation but has been agitated/belligerent as per the staff intermittently.  Redirectable, communicative and interactive.  Continue plan of care as per psychiatry with Zyprexa and as needed meds, she will need inpatient psych admission-continue one-to-one.  Severe Anemia, microcytic: History of chronic anemia with severe iron deficiency/menorrhagia- previous hemoglobin back in July 2020 was  5.2-needing 2 unit prbc and hp upto 8.8 gm.unable to do iron panel as patient has already received on a blood  transfusio.  Hemoglobin improved after 2 units prbc. Will need outpatient follow-up with hematology. Added iron supplement orally.  Metabolic acidosis: likely due to dehydration/agitation. Resolved.  Body mass index is 21.14 kg/m.    DVT prophylaxis: SCD Code Status: full Family Communication: plan of care discussed with patient at bedside. w RN. Disposition Plan: At this point patient is medically stable awaiting for inpatient psychiatric admission.  Discontinue her remdesivir upon discharge.   Consultants: psych  Procedures:none Microbiology:covid 19 + Antimicrobials: Anti-infectives (From admission, onward)   Start     Dose/Rate Route Frequency Ordered Stop   08/04/19 1000  remdesivir 100 mg in sodium chloride 0.9 % 100 mL IVPB     100 mg 200 mL/hr over 30 Minutes Intravenous Daily 08/03/19 0745 08/08/19 0959   08/03/19 0900  remdesivir 200 mg in sodium chloride 0.9% 250 mL IVPB     200 mg 580 mL/hr over 30 Minutes Intravenous Once 08/03/19 0745 08/03/19 0954       Objective: Vitals:   08/03/19 1156 08/03/19 1243 08/03/19 1715 08/04/19 0641  BP: 129/75 124/71 125/76 (!) 141/98  Pulse: (!) 112 99 98 95  Resp: 18 20 20 20   Temp: (!) 100.5 F (38.1 C) 99.3 F (37.4 C) 99.9 F (37.7 C) 99.3 F (37.4 C)  TempSrc: Oral Oral Oral Oral  SpO2: 100% 100% 100% 100%  Weight:      Height:        Intake/Output Summary (Last 24 hours) at 08/04/2019 1408 Last data filed at 08/04/2019 0300 Gross per 24 hour  Intake 1752.06 ml  Output -  Net 1752.06 ml   Filed Weights   08/02/19 1439  Weight: 61.2 kg   Weight change:   Body mass index is 21.14  kg/m.  Intake/Output from previous day: 12/05 0701 - 12/06 0700 In: 2791.9 [P.O.:1680; I.V.:517.4; Blood:304.5; IV Piggyback:290] Out: -  Intake/Output this shift: No intake/output data recorded.  Examination:  General exam: AAOx3, intermittently agitated, was alert awake and calm on my examination  HEENT:Oral mucosa  moist, Ear/Nose WNL grossly, dentition normal. Respiratory system: Diminished at the base,no wheezing or crackles,no use of accessory muscle Cardiovascular system: S1 & S2 +, No JVD,. Gastrointestinal system: Abdomen soft, NT,ND, BS+ Nervous System:Alert, awake, moving extremities and grossly nonfocal Extremities: No edema, distal peripheral pulses palpable.  Skin: No rashes,no icterus. MSK: Normal muscle bulk,tone, power  Medications:  Scheduled Meds: . ferrous sulfate  325 mg Oral BID WC  . folic acid  1 mg Oral Daily  . multivitamin with minerals  1 tablet Oral Daily  . OLANZapine zydis  10 mg Oral QHS  . sodium chloride flush  3 mL Intravenous Q12H  . thiamine  100 mg Oral Daily   Continuous Infusions: . sodium chloride    . 0.9 % NaCl with KCl 20 mEq / L 50 mL/hr at 08/04/19 1338  . remdesivir 100 mg in NS 100 mL 100 mg (08/04/19 0954)    Data Reviewed: I have personally reviewed following labs and imaging studies  CBC: Recent Labs  Lab 08/02/19 1955 08/03/19 0526 08/03/19 1528  WBC 9.4 4.4  --   HGB 4.4* 4.6* 7.2*  HCT 19.0* 17.9* 26.0*  MCV 66.0* 67.0*  --   PLT 663* 458*  --    Basic Metabolic Panel: Recent Labs  Lab 08/02/19 1955 08/03/19 0526  NA 141 140  K 3.9 4.2  CL 108 109  CO2 21* 24  GLUCOSE 127* 97  BUN 11 13  CREATININE 0.86 0.84  CALCIUM 9.5 8.9   GFR: Estimated Creatinine Clearance: 82.6 mL/min (by C-G formula based on SCr of 0.84 mg/dL). Liver Function Tests: Recent Labs  Lab 08/02/19 1955 08/03/19 0526  AST 13* 11*  ALT 7 8  ALKPHOS 48 39  BILITOT 0.8 0.6  PROT 7.8 6.6  ALBUMIN 3.9 3.4*   No results for input(s): LIPASE, AMYLASE in the last 168 hours. No results for input(s): AMMONIA in the last 168 hours. Coagulation Profile: No results for input(s): INR, PROTIME in the last 168 hours. Cardiac Enzymes: No results for input(s): CKTOTAL, CKMB, CKMBINDEX, TROPONINI in the last 168 hours. BNP (last 3 results) No results for  input(s): PROBNP in the last 8760 hours. HbA1C: No results for input(s): HGBA1C in the last 72 hours. CBG: No results for input(s): GLUCAP in the last 168 hours. Lipid Profile: No results for input(s): CHOL, HDL, LDLCALC, TRIG, CHOLHDL, LDLDIRECT in the last 72 hours. Thyroid Function Tests: No results for input(s): TSH, T4TOTAL, FREET4, T3FREE, THYROIDAB in the last 72 hours. Anemia Panel: No results for input(s): VITAMINB12, FOLATE, FERRITIN, TIBC, IRON, RETICCTPCT in the last 72 hours. Sepsis Labs: Recent Labs  Lab 08/03/19 0526  PROCALCITON <0.10    Recent Results (from the past 240 hour(s))  SARS Coronavirus 2 by RT PCR (hospital order, performed in [facility] hospital lab) Nasopharyngeal Nasopharyngeal Swab     Status: Abnormal   Collection Time: 08/02/19  7:55 PM   Specimen: Nasopharyngeal Swab  Result Value Ref Range Status   SARS Coronavirus 2 POSITIVE (A) NEGATIVE Final    Comment: RESULT CALLED TO, READ BACK BY AND VERIFIED WITH: [employer] AT 0027 ON 08/03/19 BY A,MOHAMED (NOTE) SARS-CoV-2 target nucleic acids are DETECTED SARS-CoV-2 RNA  is generally detectable in upper respiratory specimens  during the acute phase of infection.  Positive results are indicative  of the presence of the identified virus, but do not rule out bacterial infection or co-infection with other pathogens not detected by the test.  Clinical correlation with patient history and  other diagnostic information is necessary to determine patient infection status.  The expected result is negative. Fact Sheet for Patients:   StrictlyIdeas.no  Fact Sheet for Healthcare Providers:   BankingDealers.co.za   This test is not yet approved or cleared by the [place] FDA and  has been authorized for detection and/or diagnosis of SARS-CoV-2 by FDA under an Emergency Use Authorization (EUA).  This EUA will remain in effect (meaning thi s test can be  used) for the duration of  the COVID-19 declaration under Section 564(b)(1) of the Act, 21 U.S.C. section 360-bbb-3(b)(1), unless the authorization is terminated or revoked sooner. Performed at [facility], [place] [address], [place], [place] [address]       Radiology Studies: No results found.    LOS: 1 day   Time spent: More than 50% of that time was spent in counseling and/or coordination of care.  Antonieta Pert, MD [facility]  08/04/2019, 2:08 PM

## 2019-08-04 NOTE — Progress Notes (Signed)
Pt. Is non-compliant. She  Refuses Medication and Care. Pt. Is verbally aggressive and states she wants to be left alone.  Pt. Is resting in bed with sitter present. Assessment and Vitals are incomplete.

## 2019-08-04 NOTE — Progress Notes (Addendum)
Pt removed IV site on left extremity as well as removed all wristbands. Pt states that she is hearing "them click in her finger and I had better tell them to stop". Seems to be experiencing some form of auditory hallucination.   Pt requested tylenol for temp of 99.3 yet had no c/o of fever or pain.   Upon IV consult, Pt was aggressive and combative to IV consult team member but allowed for a new IV site to be established. Upon the IV being inserted, she requested two sandwiches and a gingerale was left lying in bed resting.

## 2019-08-05 LAB — TYPE AND SCREEN
ABO/RH(D): O POS
Antibody Screen: POSITIVE
DAT, IgG: NEGATIVE
Donor AG Type: NEGATIVE
Donor AG Type: NEGATIVE
PT AG Type: NEGATIVE
Unit division: 0
Unit division: 0

## 2019-08-05 LAB — BPAM RBC
Blood Product Expiration Date: 202101052359
Blood Product Expiration Date: 202101052359
ISSUE DATE / TIME: 202012050259
ISSUE DATE / TIME: 202012051000
Unit Type and Rh: 5100
Unit Type and Rh: 5100

## 2019-08-05 MED ORDER — LORAZEPAM 2 MG/ML IJ SOLN: 1.0000 mg | INTRAMUSCULAR | Status: DC | PRN

## 2019-08-05 NOTE — Progress Notes (Signed)
RN attempted to do morning assessment. Pt became verbally aggressive stating "Get the hell out you're not doing anything to me" RN was unable to do morning assessment due to Pt's aggressive behavior. Safety precautions remain in place with Sitter at bedside.Pt is also refusing labs to be drawn will notify and make MD aware

## 2019-08-05 NOTE — TOC Progression Note (Addendum)
Transition of Care [facility]) - Progression Note    Patient Details  Name: Valerie Howard MRN: [id number] Date of Birth: 1976-06-15  Transition of Care [facility]) CM/SW [place], [place] Phone Number: 08/05/2019, 3:18 PM  Clinical Narrative:    Patient IVC paperwork reviewed 12/7. Expires December 11, 9:49am.  CSW discussed placement options with Behavioral Health Disposition Transitions of Care staff. As of now, [facility] is not accepting COVID positive patients.  There are no other known Psychiatric Hospitals accepting covid positive patients at this time.      Barriers to Discharge: Other (comment), Psych Bed not available(COVID positive/ No Psychiatric Bed available.)  Expected Discharge Plan and Services                           DME Arranged: N/A         HH Arranged: NA           Social Determinants of Health (SDOH) Interventions    Readmission Risk Interventions No flowsheet data found.

## 2019-08-05 NOTE — Progress Notes (Signed)
Pt agreed to take oral Medications and agreed to RN assessing Resp. Status but refused complete assessment.

## 2019-08-05 NOTE — Progress Notes (Signed)
PROGRESS NOTE    Valerie Howard  [id number] DOB: 10/16/1975 DOA: 08/02/2019 PCP: Patient, No Pcp Per   Brief Narrative: 84 female with history of schizophrenia/paranoid behavior was brought to the ER under involuntary commitment due to belligerent, threatening behavior.  Patient was seen in the ER, lab work showed significant anemia with urine 4.4 g, MCV 66, platelets 63.,  Bicarb 21, UA/ alcohol level/ urine drug screen , Salicylate level were unremarkable.  She was noted to be febrile up to 100.7. Patient was positive on Covid RT swab testing.  Procalcitonin negative. 2 units PRBC was ordered and patient was admitted.  Subjective:  On RA, sitter and RN at bedside Not making eye contact.  Replying back with I don't wan t to tell you Nursing reports pt has been agitated and refusing care.   Asessment & Plan:  SIRIS due to COVID/Covid 19 infection:  with very tachycardia.  No evidence of sepsis.  Now afebrile.UA unremarkable.  While patient remains hospitalized we will continue remdesivir pharmacy to dose.  Patient is hemodynamically stable and not hypoxic-holding off on steroid.  Patient is refusing inflammatory markers. Monitor inflammatory markers.     Psych issues:Schizoaffective disorder, bipolar type with agitation and belligerent behavior, under involuntary commitment )reviewed paperwork 08/02/19- expires in 3 days-ask case manager to for Renewal of IVC. Currently Alert awake oriented x3, denies SI/suicidal ideation but has been agitated/belligerent and refusing cares.  Seen by psychiatry continue Zyprexa added IV Ativan if unable to use p.o.Continue one-to-one.  Severe Anemia, microcytic: History of chronic anemia with severe iron deficiency/menorrhagia- previous hemoglobin back in July 2020 was  5.2-needing 2 unit prbc and hp upto 8.8 gm.unable to do iron panel as patient has already received on a blood transfusio.  Hemoglobin improved after 2 units prbc. Will need outpatient  follow-up with hematology. Cont iron supplement orally.  Metabolic acidosis: likely due to dehydration/agitation. Resolved.  Body mass index is 21.14 kg/m.    DVT prophylaxis: SCD, holding Lovenox today severe anemia on admission Code Status: full Family Communication: plan of care discussed with patient at bedside. w RN  And encasement nature Disposition Plan: At this point patient is medically stable stable for discharge to inpatient psychiatry facility social worker case manager on board.  Unfortunately will be difficult placement due to her Covid status.  Consultants: psych  Procedures:none Microbiology:covid 19 + Antimicrobials: Anti-infectives (From admission, onward)   Start     Dose/Rate Route Frequency Ordered Stop   08/04/19 1000  remdesivir 100 mg in sodium chloride 0.9 % 100 mL IVPB     100 mg 200 mL/hr over 30 Minutes Intravenous Daily 08/03/19 0745 08/08/19 0959   08/03/19 0900  remdesivir 200 mg in sodium chloride 0.9% 250 mL IVPB     200 mg 580 mL/hr over 30 Minutes Intravenous Once 08/03/19 0745 08/03/19 0954       Objective: Vitals:   08/03/19 1156 08/03/19 1243 08/03/19 1715 08/04/19 0641  BP: 129/75 124/71 125/76 (!) 141/98  Pulse: (!) 112 99 98 95  Resp: 18 20 20 20   Temp: (!) 100.5 F (38.1 C) 99.3 F (37.4 C) 99.9 F (37.7 C) 99.3 F (37.4 C)  TempSrc: Oral Oral Oral Oral  SpO2: 100% 100% 100% 100%  Weight:      Height:        Intake/Output Summary (Last 24 hours) at 08/05/2019 0930 Last data filed at 08/04/2019 1800 Gross per 24 hour  Intake 1788.23 ml  Output -  Net  1788.23 ml   Filed Weights   08/02/19 1439  Weight: 61.2 kg   Weight change:   Body mass index is 21.14 kg/m.  Intake/Output from previous day: 12/06 0701 - 12/07 0700 In: 1788.2 [P.O.:1010; I.V.:678.2; IV Piggyback:100] Out: -  Intake/Output this shift: No intake/output data recorded.  Examination:  General exam: Alert awake, appears, but not making eye  contact. HEENT:Oral mucosa moist, Ear/Nose WNL grossly, dentition normal. Respiratory system: b/l clear, no crackles or wheezes Gastrointestinal system: Abdomen soft, NT,ND, BS+ Nervous System:Alert, awake, moving extremities and grossly nonfocal Extremities: No edema, distal peripheral pulses palpable.  Skin: No rashes,no icterus. MSK: Normal muscle bulk,tone, power  Medications:  Scheduled Meds: . ferrous sulfate  325 mg Oral BID WC  . folic acid  1 mg Oral Daily  . multivitamin with minerals  1 tablet Oral Daily  . OLANZapine zydis  10 mg Oral QHS  . sodium chloride flush  3 mL Intravenous Q12H  . thiamine  100 mg Oral Daily   Continuous Infusions: . sodium chloride    . 0.9 % NaCl with KCl 20 mEq / L 50 mL/hr at 08/04/19 1338  . remdesivir 100 mg in NS 100 mL 100 mg (08/05/19 0920)    Data Reviewed: I have personally reviewed following labs and imaging studies  CBC: Recent Labs  Lab 08/02/19 1955 08/03/19 0526 08/03/19 1528  WBC 9.4 4.4  --   HGB 4.4* 4.6* 7.2*  HCT 19.0* 17.9* 26.0*  MCV 66.0* 67.0*  --   PLT 663* 458*  --    Basic Metabolic Panel: Recent Labs  Lab 08/02/19 1955 08/03/19 0526  NA 141 140  K 3.9 4.2  CL 108 109  CO2 21* 24  GLUCOSE 127* 97  BUN 11 13  CREATININE 0.86 0.84  CALCIUM 9.5 8.9   GFR: Estimated Creatinine Clearance: 82.6 mL/min (by C-G formula based on SCr of 0.84 mg/dL). Liver Function Tests: Recent Labs  Lab 08/02/19 1955 08/03/19 0526  AST 13* 11*  ALT 7 8  ALKPHOS 48 39  BILITOT 0.8 0.6  PROT 7.8 6.6  ALBUMIN 3.9 3.4*   No results for input(s): LIPASE, AMYLASE in the last 168 hours. No results for input(s): AMMONIA in the last 168 hours. Coagulation Profile: No results for input(s): INR, PROTIME in the last 168 hours. Cardiac Enzymes: No results for input(s): CKTOTAL, CKMB, CKMBINDEX, TROPONINI in the last 168 hours. BNP (last 3 results) No results for input(s): PROBNP in the last 8760 hours. HbA1C: No  results for input(s): HGBA1C in the last 72 hours. CBG: No results for input(s): GLUCAP in the last 168 hours. Lipid Profile: No results for input(s): CHOL, HDL, LDLCALC, TRIG, CHOLHDL, LDLDIRECT in the last 72 hours. Thyroid Function Tests: No results for input(s): TSH, T4TOTAL, FREET4, T3FREE, THYROIDAB in the last 72 hours. Anemia Panel: No results for input(s): VITAMINB12, FOLATE, FERRITIN, TIBC, IRON, RETICCTPCT in the last 72 hours. Sepsis Labs: Recent Labs  Lab 08/03/19 0526  PROCALCITON <0.10    Recent Results (from the past 240 hour(s))  SARS Coronavirus 2 by RT PCR (hospital order, performed in [facility] hospital lab) Nasopharyngeal Nasopharyngeal Swab     Status: Abnormal   Collection Time: 08/02/19  7:55 PM   Specimen: Nasopharyngeal Swab  Result Value Ref Range Status   SARS Coronavirus 2 POSITIVE (A) NEGATIVE Final    Comment: RESULT CALLED TO, READ BACK BY AND VERIFIED WITH: [employer] AT [id number] ON 08/03/19 BY A,MOHAMED (NOTE) SARS-CoV-2  target nucleic acids are DETECTED SARS-CoV-2 RNA is generally detectable in upper respiratory specimens  during the acute phase of infection.  Positive results are indicative  of the presence of the identified virus, but do not rule out bacterial infection or co-infection with other pathogens not detected by the test.  Clinical correlation with patient history and  other diagnostic information is necessary to determine patient infection status.  The expected result is negative. Fact Sheet for Patients:   StrictlyIdeas.no  Fact Sheet for Healthcare Providers:   BankingDealers.co.za   This test is not yet approved or cleared by the [place] FDA and  has been authorized for detection and/or diagnosis of SARS-CoV-2 by FDA under an Emergency Use Authorization (EUA).  This EUA will remain in effect (meaning thi s test can be used) for the duration of  the COVID-19 declaration  under Section 564(b)(1) of the Act, 21 U.S.C. section 360-bbb-3(b)(1), unless the authorization is terminated or revoked sooner. Performed at [facility], [place] [address] [place], [place] [address]       Radiology Studies: No results found.    LOS: 2 days   Time spent: More than 50% of that time was spent in counseling and/or coordination of care.  Antonieta Pert, MD [facility]  08/05/2019, 9:30 AM

## 2019-08-05 NOTE — Progress Notes (Signed)
I attempted to contact Pt by phone 3 times. No contact was made at this time. Chaplain available as needed.   Chaplain Resident Fidel Levy  [phone]

## 2019-08-05 NOTE — Plan of Care (Signed)
Pt refusing any teaching at this time

## 2019-08-05 NOTE — Progress Notes (Signed)
IV site noted to with mild swelling. IV site removed. RN explained Pt need for IV restart. Pt states "No not right now try it" pt becoming more verbally abusive and agitated. Report given to Night RN and will retry to place IV later

## 2019-08-06 NOTE — Progress Notes (Signed)
PROGRESS NOTE    Valerie Howard  [id number] DOB: Nov 13, 1975 DOA: 08/02/2019 PCP: Patient, No Pcp Per   Brief Narrative: 59 female with history of schizophrenia/paranoid behavior was brought to the ER under involuntary commitment due to belligerent, threatening behavior.  Patient was seen in the ER, lab work showed significant anemia with urine 4.4 g, MCV 66, platelets 63.,  Bicarb 21, UA/ alcohol level/ urine drug screen , Salicylate level were unremarkable.  She was noted to be febrile up to 100.7. Patient was positive on Covid RT swab testing.  Procalcitonin negative. 2 units PRBC was ordered and patient was admitted.  Subjective:  On RA, sitter and RN at bedside Alert awake oriented, appears to be somewhat agitated nursing reports she has not been compliant with her medication.  Asessment & Plan:  SIRIS due to COVID/Covid 19 infection: No evidence of sepsis.  Now afebrile.UA unremarkable.  While patient remains hospitalized we will continue remdesivir pharmacy to dose.  At this time patient is relatively asymptomatic with Covid infection.She has been refusing remdesivir this morning per nursing.she is  stable and not hypoxic-holding off on steroid. Refused labs.   Psych issues:Schizoaffective disorder, bipolar type with agitation and belligerent behavior, under involuntary commitment )reviewed paperwork 08/02/19- expires in 3 days-ask case manager to for Renewal of IVC. Currently Alert awake oriented x3, denies SI/suicidal ideation but but is intermittently agitated and refusing cares.Seen by psychiatry continue Zyprexa added IV Ativan if unable to use p.o.Continue one-to-one.  Continue as needed medication.  Severe Anemia, microcytic: History of chronic anemia with severe iron deficiency/menorrhagia- previous hemoglobin back in July 2020 was  5.2-needing 2 unit prbc and hp upto 8.8 gm.unable to do iron panel as patient has already received on a blood transfusio.  Hemoglobin improved  after 2 units prbc. Will need outpatient follow-up with hematology. Cont iron supplement orally.  Metabolic acidosis: likely due to dehydration/agitation. Resolved.  Body mass index is 23.18 kg/m.    DVT prophylaxis: SCD, holding Lovenox today severe anemia on admission Code Status: full Family Communication: plan of care discussed with patient at bedside. w RN  And CM Disposition Plan: At this point patient is medically stable stable for discharge to inpatient psychiatry facility social worker case manager on board.  Unfortunately will be difficult placement due to her Covid status.  Consultants: psych  Procedures:none Microbiology:covid 19 + Antimicrobials: Anti-infectives (From admission, onward)   Start     Dose/Rate Route Frequency Ordered Stop   08/04/19 1000  remdesivir 100 mg in sodium chloride 0.9 % 100 mL IVPB     100 mg 200 mL/hr over 30 Minutes Intravenous Daily 08/03/19 0745 08/08/19 0959   08/03/19 0900  remdesivir 200 mg in sodium chloride 0.9% 250 mL IVPB     200 mg 580 mL/hr over 30 Minutes Intravenous Once 08/03/19 0745 08/03/19 0954       Objective: Vitals:   08/06/19 0900 08/06/19 1040 08/06/19 1423 08/06/19 1507  BP:  137/88 (!) 165/138 132/85  Pulse:  96 79 98  Resp:  17 16   Temp:  99.3 F (37.4 C) 99.7 F (37.6 C)   TempSrc:  Oral Oral   SpO2:  100% 100%   Weight: 67.1 kg     Height:        Intake/Output Summary (Last 24 hours) at 08/06/2019 1528 Last data filed at 08/06/2019 1525 Gross per 24 hour  Intake 3514.53 ml  Output 7 ml  Net 3507.53 ml   [employer]  08/02/19 1439 08/06/19 0900  Weight: 61.2 kg 67.1 kg   Weight change:   Body mass index is 23.18 kg/m.  Intake/Output from previous day: 12/07 0701 - 12/08 0700 In: 3728.5 [P.O.:2040; I.V.:1588.5; IV Piggyback:100] Out: 4 [Urine:3; Stool:1] Intake/Output this shift: Total I/O In: 386 [P.O.:386] Out: 3 [Urine:3]  Examination:  General exam: Alert awake but agitated,  on room air, not in distress HEENT:Oral mucosa moist, Ear/Nose WNL grossly, dentition normal. Respiratory system: No wheezing crackles, nontender Gastrointestinal system: Abdomen soft, NT,ND, BS+ Nervous System:Alert, awake, nonfocal Extremities: No edema, distal peripheral pulses palpable.  Skin: No rashes,no icterus. MSK: Normal muscle bulk,tone, power  Medications:  Scheduled Meds: . ferrous sulfate  325 mg Oral BID WC  . folic acid  1 mg Oral Daily  . multivitamin with minerals  1 tablet Oral Daily  . OLANZapine zydis  10 mg Oral QHS  . sodium chloride flush  3 mL Intravenous Q12H  . thiamine  100 mg Oral Daily   Continuous Infusions: . sodium chloride    . remdesivir 100 mg in NS 100 mL Stopped (08/05/19 0953)    Data Reviewed: I have personally reviewed following labs and imaging studies  CBC: Recent Labs  Lab 08/02/19 1955 08/03/19 0526 08/03/19 1528  WBC 9.4 4.4  --   HGB 4.4* 4.6* 7.2*  HCT 19.0* 17.9* 26.0*  MCV 66.0* 67.0*  --   PLT 663* 458*  --    Basic Metabolic Panel: Recent Labs  Lab 08/02/19 1955 08/03/19 0526  NA 141 140  K 3.9 4.2  CL 108 109  CO2 21* 24  GLUCOSE 127* 97  BUN 11 13  CREATININE 0.86 0.84  CALCIUM 9.5 8.9   GFR: Estimated Creatinine Clearance: 83.1 mL/min (by C-G formula based on SCr of 0.84 mg/dL). Liver Function Tests: Recent Labs  Lab 08/02/19 1955 08/03/19 0526  AST 13* 11*  ALT 7 8  ALKPHOS 48 39  BILITOT 0.8 0.6  PROT 7.8 6.6  ALBUMIN 3.9 3.4*   No results for input(s): LIPASE, AMYLASE in the last 168 hours. No results for input(s): AMMONIA in the last 168 hours. Coagulation Profile: No results for input(s): INR, PROTIME in the last 168 hours. Cardiac Enzymes: No results for input(s): CKTOTAL, CKMB, CKMBINDEX, TROPONINI in the last 168 hours. BNP (last 3 results) No results for input(s): PROBNP in the last 8760 hours. HbA1C: No results for input(s): HGBA1C in the last 72 hours. CBG: No results for  input(s): GLUCAP in the last 168 hours. Lipid Profile: No results for input(s): CHOL, HDL, LDLCALC, TRIG, CHOLHDL, LDLDIRECT in the last 72 hours. Thyroid Function Tests: No results for input(s): TSH, T4TOTAL, FREET4, T3FREE, THYROIDAB in the last 72 hours. Anemia Panel: No results for input(s): VITAMINB12, FOLATE, FERRITIN, TIBC, IRON, RETICCTPCT in the last 72 hours. Sepsis Labs: Recent Labs  Lab 08/03/19 0526  PROCALCITON <0.10    Recent Results (from the past 240 hour(s))  SARS Coronavirus 2 by RT PCR (hospital order, performed in [facility] hospital lab) Nasopharyngeal Nasopharyngeal Swab     Status: Abnormal   Collection Time: 08/02/19  7:55 PM   Specimen: Nasopharyngeal Swab  Result Value Ref Range Status   SARS Coronavirus 2 POSITIVE (A) NEGATIVE Final    Comment: RESULT CALLED TO, READ BACK BY AND VERIFIED WITH: [employer] AT 0027 ON 08/03/19 BY A,MOHAMED (NOTE) SARS-CoV-2 target nucleic acids are DETECTED SARS-CoV-2 RNA is generally detectable in upper respiratory specimens  during the acute phase of  infection.  Positive results are indicative  of the presence of the identified virus, but do not rule out bacterial infection or co-infection with other pathogens not detected by the test.  Clinical correlation with patient history and  other diagnostic information is necessary to determine patient infection status.  The expected result is negative. Fact Sheet for Patients:   StrictlyIdeas.no  Fact Sheet for Healthcare Providers:   BankingDealers.co.za   This test is not yet approved or cleared by the [place] FDA and  has been authorized for detection and/or diagnosis of SARS-CoV-2 by FDA under an Emergency Use Authorization (EUA).  This EUA will remain in effect (meaning thi s test can be used) for the duration of  the COVID-19 declaration under Section 564(b)(1) of the Act, 21 U.S.C. section 360-bbb-3(b)(1),  unless the authorization is terminated or revoked sooner. Performed at [facility], [place] [address] [place], [place] [address]       Radiology Studies: No results found.    LOS: 3 days   Time spent: More than 50% of that time was spent in counseling and/or coordination of care.  Antonieta Pert, MD [facility]  08/06/2019, 3:28 PM

## 2019-08-06 NOTE — Progress Notes (Signed)
Pt. Is verbally aggressive and very non compliant with medication and care. Was not able to fully assess pt. due to refusal.

## 2019-08-07 LAB — C-REACTIVE PROTEIN: CRP: 0.6 mg/dL (ref <1.0)

## 2019-08-07 LAB — D-DIMER, QUANTITATIVE: D-Dimer, Quant: 0.54 ug/mL-FEU — ABNORMAL HIGH (ref 0.00–0.50)

## 2019-08-07 LAB — FERRITIN: Ferritin: 3 ng/mL — ABNORMAL LOW (ref 11–307)

## 2019-08-07 MED ORDER — ENOXAPARIN SODIUM 40 MG/0.4ML ~~LOC~~ SOLN: 40.0000 mg | SUBCUTANEOUS | Status: DC

## 2019-08-07 MED ORDER — VITAMIN C 500 MG PO TABS
500.0000 mg | ORAL_TABLET | Freq: Two times a day (BID) | ORAL | Status: DC
  Administered 2019-08-07 – 2019-08-08 (×2): 500 mg via ORAL
  Filled 2019-08-07 (×2): qty 1

## 2019-08-07 MED ORDER — POLYVINYL ALCOHOL 1.4 % OP SOLN
1.0000 [drp] | OPHTHALMIC | Status: DC | PRN
  Administered 2019-08-07: 1 [drp] via OPHTHALMIC
  Filled 2019-08-07: qty 15

## 2019-08-07 NOTE — Consult Note (Addendum)
Telepsych Consultation   Reason for Consult:  "Still uncooperative and belligerent" Referring Physician:  Dr Cyndia Skeeters  Location of Patient: [facility] Location of Provider: [facility]  Patient Identification: Valerie Howard MRN:  [id number] Principal Diagnosis: Schizoaffective disorder, bipolar type ([place]) Diagnosis:  Principal Problem:   Schizoaffective disorder, bipolar type ([place]) Active Problems:   Anemia   Symptomatic anemia   Total Time spent with patient: 15 minutes  Subjective:   Valerie Howard is a 43 y.o. female patient admitted with involuntary commitment due to belligerent and threatening behavior. Monitor provided to patient by staff member, patient instantly refuses to participate in assessment.  Patient refuses to participate in telemedicine consult.  HPI: Patient presented to emergency department involuntarily committed related to belligerent and threatening behavior, found to be Covid positive.  Past Psychiatric History: Schizoaffective disorder, bipolar type, personality disorder, suicidal ideation  Risk to Self:  Yes Risk to Others:  Yes Prior Inpatient Therapy:  Yes Prior Outpatient Therapy:  Yes  Past Medical History:  Past Medical History:  Diagnosis Date  . History of radiation therapy 07/18/17-07/24/17   right ear, operative bed 12 Gy in 3 fractions  . Keloid    right ear  . Paranoid behavior ([place])   . Schizophrenia [facility])     Past Surgical History:  Procedure Laterality Date  . CESAREAN SECTION    . KENALOG INJECTION Bilateral 07/17/2017   Procedure: KENALOG INJECTION;  Surgeon: Wallace Going, DO;  Location: [place];  Service: Plastics;  Laterality: Bilateral;  . MASS EXCISION N/A 07/17/2017   Procedure: EXCISION KELOID OF RIGHT EAR WITH INTROPERATIVE KENALOG INJECTION AND POST OP RADIATION;  Surgeon: Wallace Going, DO;  Location: [place];  Service: Plastics;  Laterality: N/A;  . TUBAL  LIGATION     Family History:  Family History  Problem Relation Age of Onset  . Hypertension Other   . Diabetes Other   . Cancer Other   . Keloids Mother   . Breast cancer Maternal Grandmother    Family Psychiatric  History: Unknown Social History:  Social History   Substance and Sexual Activity  Alcohol Use Yes   Comment: socially     Social History   Substance and Sexual Activity  Drug Use No    Social History   Socioeconomic History  . Marital status: Divorced    Spouse name: Not on file  . Number of children: 4  . Years of education: Not on file  . Highest education level: Not on file  Occupational History  . Occupation: unemployed  Social Needs  . Financial resource strain: Not on file  . Food insecurity    Worry: Not on file    Inability: Not on file  . Transportation needs    Medical: Not on file    Non-medical: Not on file  Tobacco Use  . Smoking status: Never Smoker  . Smokeless tobacco: Never Used  Substance and Sexual Activity  . Alcohol use: Yes    Comment: socially  . Drug use: No  . Sexual activity: Not Currently    Birth control/protection: Surgical  Lifestyle  . Physical activity    Days per week: Not on file    Minutes per session: Not on file  . Stress: Not on file  Relationships  . Social Herbalist on phone: Not on file    Gets together: Not on file    Attends religious service: Not on  file    Active member of club or organization: Not on file    Attends meetings of clubs or organizations: Not on file    Relationship status: Not on file  Other Topics Concern  . Not on file  Social History Narrative  . Not on file   Additional Social History:    Allergies:   Allergies  Allergen Reactions  . Penicillins Rash    Has patient had a PCN reaction causing immediate rash, facial/tongue/throat swelling, SOB or lightheadedness with hypotension:Patient refuses to answer (PRA) Has patient had a PCN reaction causing severe  rash involving mucus membranes or skin necrosis:PRA Has patient had a PCN reaction that required hospitalization PRA Has patient had a PCN reaction occurring within the last 10 years:PRA If all of the above answers are "NO", then may proceed with Cephalosporin use.     Labs:  Results for orders placed or performed during the hospital encounter of 08/02/19 (from the past 48 hour(s))  C-reactive protein     Status: None   Collection Time: 08/07/19  3:37 AM  Result Value Ref Range   CRP 0.6 <1.0 mg/dL    Comment: Performed at [facility], [place] [address] [place], [place] [address]  D-dimer, quantitative (not at [facility])     Status: Abnormal   Collection Time: 08/07/19  3:37 AM  Result Value Ref Range   D-Dimer, Quant 0.54 (H) 0.00 - 0.50 ug/mL-FEU    Comment: (NOTE) At the manufacturer cut-off of 0.50 ug/mL FEU, this assay has been documented to exclude PE with a sensitivity and negative predictive value of 97 to 99%.  At this time, this assay has not been approved by the FDA to exclude DVT/VTE. Results should be correlated with clinical presentation. Performed at [facility], [place] [address], [place], [place] [address]   Ferritin     Status: Abnormal   Collection Time: 08/07/19  3:37 AM  Result Value Ref Range   Ferritin 3 (L) 11 - 307 ng/mL    Comment: Performed at [facility], [place] [address] [place], [place] [address]    Medications:  Current Facility-Administered Medications  Medication Dose Route Frequency Provider Last Rate Last Dose  . 0.9 %  sodium chloride infusion  10 mL/hr Intravenous Once Valarie Merino, MD      . acetaminophen (TYLENOL) tablet 650 mg  650 mg Oral Q6H PRN Antonieta Pert, MD   650 mg at 08/06/19 1943  . enoxaparin (LOVENOX) injection 40 mg  40 mg Subcutaneous Q24H Gonfa, Taye T, MD      . ferrous sulfate tablet 325 mg  325 mg Oral BID WC Kc, Ramesh, MD   325 mg at 08/05/19 0903  . folic acid  (FOLVITE) tablet 1 mg  1 mg Oral Daily Pearlean Brownie, MD   1 mg at 08/07/19 1051  . LORazepam (ATIVAN) injection 1 mg  1 mg Intravenous Q4H PRN Kc, Ramesh, MD      . OLANZapine zydis (ZYPREXA) disintegrating tablet 10 mg  10 mg Oral Q8H PRN Corena Pilgrim, MD       And  . LORazepam (ATIVAN) tablet 1 mg  1 mg Oral PRN Akintayo, Mojeed, MD      . multivitamin with minerals tablet 1 tablet  1 tablet Oral Daily Pearlean Brownie, MD   1 tablet at 08/07/19 1051  . OLANZapine zydis (ZYPREXA) disintegrating tablet 10 mg  10 mg Oral QHS Corena Pilgrim, MD      .  ondansetron (ZOFRAN) tablet 4 mg  4 mg Oral Q6H PRN Pearlean Brownie, MD       Or  . ondansetron (ZOFRAN) injection 4 mg  4 mg Intravenous Q6H PRN Pearlean Brownie, MD      . polyvinyl alcohol (LIQUIFILM TEARS) 1.4 % ophthalmic solution 1 drop  1 drop Both Eyes PRN Wendee Beavers T, MD   1 drop at 08/07/19 1051  . remdesivir 100 mg in sodium chloride 0.9 % 100 mL IVPB  100 mg Intravenous Daily Minda Ditto, RPH   Stopped at 08/05/19 [id number]  . sodium chloride flush (NS) 0.9 % injection 3 mL  3 mL Intravenous Q12H Pearlean Brownie, MD   3 mL at 08/06/19 2123  . thiamine (VITAMIN B-1) tablet 100 mg  100 mg Oral Daily Pearlean Brownie, MD   100 mg at 08/06/19 [id number]  . vitamin C (ASCORBIC ACID) tablet 500 mg  500 mg Oral BID Wendee Beavers T, MD   500 mg at 08/07/19 1211  . ziprasidone (GEODON) injection 10 mg  10 mg Intramuscular Q6H PRN Pearlean Brownie, MD        Musculoskeletal: Strength & Muscle Tone: Unable to assess Gait & Station: Unable to assess Patient leans: Unable to assess  Psychiatric Specialty Exam: Physical Exam  ROS  Blood pressure 117/79, pulse 79, temperature 98.6 F (37 C), temperature source Oral, resp. rate 16, height 5\' 7"  (1.702 m), weight 67.1 kg, SpO2 100 %.Body mass index is 23.18 kg/m.  General Appearance: Unable to assess  Eye Contact:  Unable to assess  Speech:  unable to assess  Volume:  unable to assess  Mood:  unable to assess   Affect:  unable to assess  Thought Process:  NA  Orientation:  Other:  unable to assess  Thought Content:  unable to assess  Suicidal Thoughts:  unable to assess  Homicidal Thoughts:  unable to assess  Memory:  unable to assess  Judgement:  Other:  unable to assess  Insight:  unable to assess  Psychomotor Activity:  unable to assess  Concentration:    Recall:  unable to assess  Fund of Knowledge:  unable to assess  Language:  unable to assess  Akathisia:  unable to assess  Handed:  Right  AIMS (if indicated):     Assets:  Others:  unable to assess  ADL's:    Cognition:    Sleep:        Treatment Plan Summary: Patient would benefit from scheduled and PRN medications  Disposition: Recommend psychiatric Inpatient admission when medically cleared.  This service was provided via telemedicine using a 2-way, interactive audio and video technology.  Names of all persons participating in this telemedicine service and their role in this encounter. Name:Tina Hall Busing Role: [place], FNP 08/07/2019 2:43 PM  Case discussed and plan agreed upon as per Ms Hall Busing.

## 2019-08-07 NOTE — Progress Notes (Addendum)
PROGRESS NOTE  Valerie Howard [id number] DOB: 1976/01/22   PCP: Patient, No Pcp Per  Patient is from: Home  DOA: 08/02/2019 LOS: 4  Brief Narrative / Interim history: 43 year old female with history of schizophrenia/paranoid behavior brought to ER under involuntary commitment due to belligerent and threatening behavior.  In ED, noted to be febrile to 100.7.  COVID-19 positive.  Pro-Cal negative.  Work-up significant for Hgb to 4.4 g, MCV 66, platelets 63, bicarb 2.  UA, UDS and EtOH level within normal range.  Received 2 units of RBC and admitted.  Subjective: No major events overnight of this morning.  Safety sitter in the room.  She responded, "get the fuck out here" when asked how she was doing after introducing myself.  Not cooperative.   Objective: Vitals:   08/06/19 1423 08/06/19 1507 08/06/19 2024 08/07/19 0354  BP: (!) 165/138 132/85 110/60 117/79  Pulse: 79 98 90 79  Resp: 16  18 16   Temp: 99.7 F (37.6 C)  99.6 F (37.6 C) 98.6 F (37 C)  TempSrc: Oral  Oral Oral  SpO2: 100%  100% 100%  Weight:      Height:        Intake/Output Summary (Last 24 hours) at 08/07/2019 1313 Last data filed at 08/07/2019 0500 Gross per 24 hour  Intake 1182.54 ml  Output -  Net 1182.54 ml   Filed Weights   08/02/19 1439 08/06/19 0900  Weight: 61.2 kg 67.1 kg    Examination:  GENERAL: No acute distress.  Appears well.  HEENT: MMM.  Vision and hearing grossly intact.  Scaly facial skin lesions. RESP:  No IWOB.  MSK/EXT:  No apparent deformity or edema. Moves extremities. SKIN: Scaly facial lesions NEURO: Awake, alert.  No apparent focal neuro deficit. PSYCH: Not cooperative.  Belligerent   Patient was not cooperative for further exam.  Procedures:  None  Assessment & Plan: Schizophrenia: remains agitated and belligerent.  Not cooperative.  Remains involuntary committed.  -Reconsult psych -Continue Zyprexa nightly and as needed  -Continue as needed Ativan  -Continue one-to-one safety sitter  COVID-19 infection: relatively asymptomatic from this except mild fever on admission.  No cardiopulmonary symptoms.  Saturating at 100% on room air.  Hemodynamically stable.  CRP and pro Cal within normal.  Ferritin very low.  Not sure where she is on the course of Covid infection. -Remdesivir (12/6-12/8).  Refused last dose. -Continue monitoring clinically  Severe iron deficiency anemia in patient with history of KUB (menorrhagia): Hgb 4.4 on admit> 2 units> 7.2.  Ferritin very low at 3. -Check anemia panel -Monitor H&H -Continue p.o. ferrous sulfate -Need outpatient follow-up with GYN   Metabolic acidosis: Likely due to dehydration.  Resolved.               DVT prophylaxis: Start subcu Lovenox Code Status: Full code Family Communication: Patient and/or RN. Available if any question. Disposition Plan: Remains inpatient Consultants: Psychiatry   Microbiology summarized: 12/6-COVID-19 positive.  Sch Meds:  Scheduled Meds: . ferrous sulfate  325 mg Oral BID WC  . folic acid  1 mg Oral Daily  . multivitamin with minerals  1 tablet Oral Daily  . OLANZapine zydis  10 mg Oral QHS  . sodium chloride flush  3 mL Intravenous Q12H  . thiamine  100 mg Oral Daily  . vitamin C  500 mg Oral BID   Continuous Infusions: . sodium chloride    . remdesivir 100 mg in NS 100 mL Stopped (08/05/19 0953)  PRN Meds:.acetaminophen, LORazepam, OLANZapine zydis **AND** LORazepam **AND** [COMPLETED] ziprasidone, ondansetron **OR** ondansetron (ZOFRAN) IV, polyvinyl alcohol, ziprasidone  Antimicrobials: Anti-infectives (From admission, onward)   Start     Dose/Rate Route Frequency Ordered Stop   08/04/19 1000  remdesivir 100 mg in sodium chloride 0.9 % 100 mL IVPB     100 mg 200 mL/hr over 30 Minutes Intravenous Daily 08/03/19 0745 08/08/19 0959   08/03/19 0900  remdesivir 200 mg in sodium chloride 0.9% 250 mL IVPB     200 mg 580 mL/hr over 30 Minutes  Intravenous Once 08/03/19 0745 08/03/19 0954       I have personally reviewed the following labs and images: CBC: Recent Labs  Lab 08/02/19 1955 08/03/19 0526 08/03/19 1528  WBC 9.4 4.4  --   HGB 4.4* 4.6* 7.2*  HCT 19.0* 17.9* 26.0*  MCV 66.0* 67.0*  --   PLT 663* 458*  --    BMP &GFR Recent Labs  Lab 08/02/19 1955 08/03/19 0526  NA 141 140  K 3.9 4.2  CL 108 109  CO2 21* 24  GLUCOSE 127* 97  BUN 11 13  CREATININE 0.86 0.84  CALCIUM 9.5 8.9   Estimated Creatinine Clearance: 83.1 mL/min (by C-G formula based on SCr of 0.84 mg/dL). Liver & Pancreas: Recent Labs  Lab 08/02/19 1955 08/03/19 0526  AST 13* 11*  ALT 7 8  ALKPHOS 48 39  BILITOT 0.8 0.6  PROT 7.8 6.6  ALBUMIN 3.9 3.4*   No results for input(s): LIPASE, AMYLASE in the last 168 hours. No results for input(s): AMMONIA in the last 168 hours. Diabetic: No results for input(s): HGBA1C in the last 72 hours. No results for input(s): GLUCAP in the last 168 hours. Cardiac Enzymes: No results for input(s): CKTOTAL, CKMB, CKMBINDEX, TROPONINI in the last 168 hours. No results for input(s): PROBNP in the last 8760 hours. Coagulation Profile: No results for input(s): INR, PROTIME in the last 168 hours. Thyroid Function Tests: No results for input(s): TSH, T4TOTAL, FREET4, T3FREE, THYROIDAB in the last 72 hours. Lipid Profile: No results for input(s): CHOL, HDL, LDLCALC, TRIG, CHOLHDL, LDLDIRECT in the last 72 hours. Anemia Panel: Recent Labs    08/07/19 0337  FERRITIN 3*   Urine analysis:    Component Value Date/Time   COLORURINE YELLOW 12/06/2011 0034   APPEARANCEUR CLEAR 12/06/2011 0034   LABSPEC 1.021 12/06/2011 0034   PHURINE 6.0 12/06/2011 0034   GLUCOSEU NEGATIVE 12/06/2011 0034   HGBUR LARGE (A) 12/06/2011 0034   BILIRUBINUR NEGATIVE 12/06/2011 0034   KETONESUR NEGATIVE 12/06/2011 0034   PROTEINUR NEGATIVE 12/06/2011 0034   UROBILINOGEN 0.2 12/06/2011 0034   NITRITE NEGATIVE 12/06/2011  0034   LEUKOCYTESUR NEGATIVE 12/06/2011 0034   Sepsis Labs: Invalid input(s): PROCALCITONIN, [place]  Microbiology: Recent Results (from the past 240 hour(s))  SARS Coronavirus 2 by RT PCR (hospital order, performed in [facility] hospital lab) Nasopharyngeal Nasopharyngeal Swab     Status: Abnormal   Collection Time: 08/02/19  7:55 PM   Specimen: Nasopharyngeal Swab  Result Value Ref Range Status   SARS Coronavirus 2 POSITIVE (A) NEGATIVE Final    Comment: RESULT CALLED TO, READ BACK BY AND VERIFIED WITH: [employer] AT 0027 ON 08/03/19 BY A,MOHAMED (NOTE) SARS-CoV-2 target nucleic acids are DETECTED SARS-CoV-2 RNA is generally detectable in upper respiratory specimens  during the acute phase of infection.  Positive results are indicative  of the presence of the identified virus, but do not rule out bacterial infection or  co-infection with other pathogens not detected by the test.  Clinical correlation with patient history and  other diagnostic information is necessary to determine patient infection status.  The expected result is negative. Fact Sheet for Patients:   StrictlyIdeas.no  Fact Sheet for Healthcare Providers:   BankingDealers.co.za   This test is not yet approved or cleared by the [place] FDA and  has been authorized for detection and/or diagnosis of SARS-CoV-2 by FDA under an Emergency Use Authorization (EUA).  This EUA will remain in effect (meaning thi s test can be used) for the duration of  the COVID-19 declaration under Section 564(b)(1) of the Act, 21 U.S.C. section 360-bbb-3(b)(1), unless the authorization is terminated or revoked sooner. Performed at [facility], [place] [address], [place], [place] [address]     Radiology Studies: No results found.   Taye T. Justin  If 7PM-7AM, please contact night-coverage [email or web address] Password [email or web address] 08/07/2019, 1:13 PM

## 2019-08-08 DIAGNOSIS — U071 COVID-19: Principal | ICD-10-CM

## 2019-08-08 MED ORDER — SENNOSIDES-DOCUSATE SODIUM 8.6-50 MG PO TABS: 1.0000 | ORAL_TABLET | Freq: Two times a day (BID) | ORAL | 0 refills | Status: DC | PRN

## 2019-08-08 MED ORDER — OLANZAPINE 10 MG PO TABS: 10.0000 mg | ORAL_TABLET | Freq: Every day | ORAL | 1 refills | Status: DC

## 2019-08-08 MED ORDER — FERROUS SULFATE 325 (65 FE) MG PO TABS: 325.0000 mg | ORAL_TABLET | Freq: Two times a day (BID) | ORAL | 0 refills | Status: DC

## 2019-08-08 MED ORDER — POLYETHYLENE GLYCOL 3350 17 GM/SCOOP PO POWD: 17.0000 g | Freq: Two times a day (BID) | ORAL | 0 refills | Status: DC | PRN

## 2019-08-08 MED ORDER — FOLIC ACID 1 MG PO TABS: 1.0000 mg | ORAL_TABLET | Freq: Every day | ORAL | 1 refills | Status: DC

## 2019-08-08 NOTE — Discharge Instructions (Signed)
COVID-19 COVID-19 is a respiratory infection that is caused by a virus called severe acute respiratory syndrome coronavirus 2 (SARS-CoV-2). The disease is also known as coronavirus disease or novel coronavirus. In some people, the virus may not cause any symptoms. In others, it may cause a serious infection. The infection can get worse quickly and can lead to complications, such as:  Pneumonia, or infection of the lungs.  Acute respiratory distress syndrome or ARDS. This is fluid build-up in the lungs.  Acute respiratory failure. This is a condition in which there is not enough oxygen passing from the lungs to the body.  Sepsis or septic shock. This is a serious bodily reaction to an infection.  Blood clotting problems.  Secondary infections due to bacteria or fungus. The virus that causes COVID-19 is contagious. This means that it can spread from person to person through droplets from coughs and sneezes (respiratory secretions). What are the causes? This illness is caused by a virus. You may catch the virus by:  Breathing in droplets from an infected person's cough or sneeze.  Touching something, like a table or a doorknob, that was exposed to the virus (contaminated) and then touching your mouth, nose, or eyes. What increases the risk? Risk for infection You are more likely to be infected with this virus if you:  Live in or travel to an area with a COVID-19 outbreak.  Come in contact with a sick person who recently traveled to an area with a COVID-19 outbreak.  Provide care for or live with a person who is infected with COVID-19. Risk for serious illness You are more likely to become seriously ill from the virus if you:  Are 59 years of age or older.  Have a long-term disease that lowers your body's ability to fight infection (immunocompromised).  Live in a nursing home or long-term care facility.  Have a long-term (chronic) disease such as: ? Chronic lung disease,  including chronic obstructive pulmonary disease or asthma ? Heart disease. ? Diabetes. ? Chronic kidney disease. ? Liver disease.  Are obese. What are the signs or symptoms? Symptoms of this condition can range from mild to severe. Symptoms may appear any time from 2 to 14 days after being exposed to the virus. They include:  A fever.  A cough.  Difficulty breathing.  Chills.  Muscle pains.  A sore throat.  Loss of taste or smell. Some people may also have stomach problems, such as nausea, vomiting, or diarrhea. Other people may not have any symptoms of COVID-19. How is this diagnosed? This condition may be diagnosed based on:  Your signs and symptoms, especially if: ? You live in an area with a COVID-19 outbreak. ? You recently traveled to or from an area where the virus is common. ? You provide care for or live with a person who was diagnosed with COVID-19.  A physical exam.  Lab tests, which may include: ? A nasal swab to take a sample of fluid from your nose. ? A throat swab to take a sample of fluid from your throat. ? A sample of mucus from your lungs (sputum). ? Blood tests.  Imaging tests, which may include, X-rays, CT scan, or ultrasound. How is this treated? At present, there is no medicine to treat COVID-19. Medicines that treat other diseases are being used on a trial basis to see if they are effective against COVID-19. Your health care provider will talk with you about ways to treat your symptoms. For most  people, the infection is mild and can be managed at home with rest, fluids, and over-the-counter medicines. °Treatment for a serious infection usually takes places in a hospital intensive care unit (ICU). It may include one or more of the following treatments. These treatments are given until your symptoms improve. °· Receiving fluids and medicines through an IV. °· Supplemental oxygen. Extra oxygen is given through a tube in the nose, a face mask, or a  hood. °· Positioning you to lie on your stomach (prone position). This makes it easier for oxygen to get into the lungs. °· Continuous positive airway pressure (CPAP) or bi-level positive airway pressure (BPAP) machine. This treatment uses mild air pressure to keep the airways open. A tube that is connected to a motor delivers oxygen to the body. °· Ventilator. This treatment moves air into and out of the lungs by using a tube that is placed in your windpipe. °· Tracheostomy. This is a procedure to create a hole in the neck so that a breathing tube can be inserted. °· Extracorporeal membrane oxygenation (ECMO). This procedure gives the lungs a chance to recover by taking over the functions of the heart and lungs. It supplies oxygen to the body and removes carbon dioxide. °Follow these instructions at home: °Lifestyle °· If you are sick, stay home except to get medical care. Your health care provider will tell you how long to stay home. Call your health care provider before you go for medical care. °· Rest at home as told by your health care provider. °· Do not use any products that contain nicotine or tobacco, such as cigarettes, e-cigarettes, and chewing tobacco. If you need help quitting, ask your health care provider. °· Return to your normal activities as told by your health care provider. Ask your health care provider what activities are safe for you. °General instructions °· Take over-the-counter and prescription medicines only as told by your health care provider. °· Drink enough fluid to keep your urine pale yellow. °· Keep all follow-up visits as told by your health care provider. This is important. °How is this prevented? ° °There is no vaccine to help prevent COVID-19 infection. However, there are steps you can take to protect yourself and others from this virus. °To protect yourself:  °· Do not travel to areas where COVID-19 is a risk. The areas where COVID-19 is reported change often. To identify  high-risk areas and travel restrictions, check the CDC travel website: wwwnc.cdc.gov/travel/notices °· If you live in, or must travel to, an area where COVID-19 is a risk, take precautions to avoid infection. °? Stay away from people who are sick. °? Wash your hands often with soap and water for 20 seconds. If soap and water are not available, use an alcohol-based hand sanitizer. °? Avoid touching your mouth, face, eyes, or nose. °? Avoid going out in public, follow guidance from your state and local health authorities. °? If you must go out in public, wear a cloth face covering or face mask. °? Disinfect objects and surfaces that are frequently touched every day. This may include: °§ Counters and tables. °§ Doorknobs and light switches. °§ Sinks and faucets. °§ Electronics, such as phones, remote controls, keyboards, computers, and tablets. °To protect others: °If you have symptoms of COVID-19, take steps to prevent the virus from spreading to others. °· If you think you have a COVID-19 infection, contact your health care provider right away. Tell your health care team that you think you may   have a COVID-19 infection.  Stay home. Leave your house only to seek medical care. Do not use public transport.  Do not travel while you are sick.  Wash your hands often with soap and water for 20 seconds. If soap and water are not available, use alcohol-based hand sanitizer.  Stay away from other members of your household. Let healthy household members care for children and pets, if possible. If you have to care for children or pets, wash your hands often and wear a mask. If possible, stay in your own room, separate from others. Use a different bathroom.  Make sure that all people in your household wash their hands well and often.  Cough or sneeze into a tissue or your sleeve or elbow. Do not cough or sneeze into your hand or into the air.  Wear a cloth face covering or face mask. Where to find more  information  Centers for Disease Control and Prevention: PurpleGadgets.be  World Health Organization: https://www.castaneda.info/ Contact a health care provider if:  You live in or have traveled to an area where COVID-19 is a risk and you have symptoms of the infection.  You have had contact with someone who has COVID-19 and you have symptoms of the infection. Get help right away if:  You have trouble breathing.  You have pain or pressure in your chest.  You have confusion.  You have bluish lips and fingernails.  You have difficulty waking from sleep.  You have symptoms that get worse. These symptoms may represent a serious problem that is an emergency. Do not wait to see if the symptoms will go away. Get medical help right away. Call your local emergency services (911 in the [place] Do not drive yourself to the hospital. Let the emergency medical personnel know if you think you have COVID-19. Summary  COVID-19 is a respiratory infection that is caused by a virus. It is also known as coronavirus disease or novel coronavirus. It can cause serious infections, such as pneumonia, acute respiratory distress syndrome, acute respiratory failure, or sepsis.  The virus that causes COVID-19 is contagious. This means that it can spread from person to person through droplets from coughs and sneezes.  You are more likely to develop a serious illness if you are 42 years of age or older, have a weak immunity, live in a nursing home, or have chronic disease.  There is no medicine to treat COVID-19. Your health care provider will talk with you about ways to treat your symptoms.  Take steps to protect yourself and others from infection. Wash your hands often and disinfect objects and surfaces that are frequently touched every day. Stay away from people who are sick and wear a mask if you are sick. This information is not intended to replace advice given to you by  your health care provider. Make sure you discuss any questions you have with your health care provider. Document Released: 09/20/2018 Document Revised: 01/10/2019 Document Reviewed: 09/20/2018 Elsevier Patient Education  [place] if You Are Sick If you are sick with COVID-19 or think you might have COVID-19, follow the steps below to help protect other people in your home and community. Stay home except to get medical care.  Stay home. Most people with COVID-19 have mild illness and are able to recover at home without medical care. Do not leave your home, except to get medical care. Do not visit public areas.  Take care of yourself.  Get rest and stay hydrated.  Get medical care when needed. Call your doctor before you go to their office for care. But, if you have trouble breathing or other concerning symptoms, call 911 for immediate help.  Avoid public transportation, ride-sharing, or taxis. Separate yourself from other people and pets in your home.  As much as possible, stay in a specific room and away from other people and pets in your home. Also, you should use a separate bathroom, if available. If you need to be around other people or animals in or outside of the home, wear a cloth face covering. ? See COVID-19 and Animals if you have questions about pets: https://www.thomas.biz/ Monitor your symptoms.  Common symptoms of COVID-19 include fever and cough. Trouble breathing is a more serious symptom that means you should get medical attention.  Follow care instructions from your healthcare provider and local health department. Your local health authorities will give instructions on checking your symptoms and reporting information. If you develop emergency warning signs for COVID-19 get medical attention immediately.  Emergency warning signs include*:  Trouble breathing  Persistent pain or pressure in  the chest  New confusion or not able to be woken  Bluish lips or face *This list is not all inclusive. Please consult your medical provider for any other symptoms that are severe or concerning to you. Call 911 if you have a medical emergency. If you have a medical emergency and need to call 911, notify the operator that you have or think you might have, COVID-19. If possible, put on a facemask before medical help arrives. Call ahead before visiting your doctor.  Call ahead. Many medical visits for routine care are being postponed or done by phone or telemedicine.  If you have a medical appointment that cannot be postponed, call your doctor's office. This will help the office protect themselves and other patients. If you are sick, wear a cloth covering over your nose and mouth.  You should wear a cloth face covering over your nose and mouth if you must be around other people or animals, including pets (even at home).  You don't need to wear the cloth face covering if you are alone. If you can't put on a cloth face covering (because of trouble breathing for example), cover your coughs and sneezes in some other way. Try to stay at least 6 feet away from other people. This will help protect the people around you. Note: During the COVID-19 pandemic, medical grade facemasks are reserved for healthcare workers and some first responders. You may need to make a cloth face covering using a scarf or bandana. Cover your coughs and sneezes.  Cover your mouth and nose with a tissue when you cough or sneeze.  Throw used tissues in a lined trash can.  Immediately wash your hands with soap and water for at least 20 seconds. If soap and water are not available, clean your hands with an alcohol-based hand sanitizer that contains at least 60% alcohol. Clean your hands often.  Wash your hands often with soap and water for at least 20 seconds. This is especially important after blowing your nose, coughing, or  sneezing; going to the bathroom; and before eating or preparing food.  Use hand sanitizer if soap and water are not available. Use an alcohol-based hand sanitizer with at least 60% alcohol, covering all surfaces of your hands and rubbing them together until they feel dry.  Soap and water are the best option, especially if your  hands are visibly dirty.  Avoid touching your eyes, nose, and mouth with unwashed hands. Avoid sharing personal household items.  Do not share dishes, drinking glasses, cups, eating utensils, towels, or bedding with other people in your home.  Wash these items thoroughly after using them with soap and water or put them in the dishwasher. Clean all "high-touch" surfaces everyday.  Clean and disinfect high-touch surfaces in your "sick room" and bathroom. Let someone else clean and disinfect surfaces in common areas, but not your bedroom and bathroom.  If a caregiver or other person needs to clean and disinfect a sick person's bedroom or bathroom, they should do so on an as-needed basis. The caregiver/other person should wear a mask and wait as long as possible after the sick person has used the bathroom. High-touch surfaces include phones, remote controls, counters, tabletops, doorknobs, bathroom fixtures, toilets, keyboards, tablets, and bedside tables.  Clean and disinfect areas that may have blood, stool, or body fluids on them.  Use household cleaners and disinfectants. Clean the area or item with soap and water or another detergent if it is dirty. Then use a household disinfectant. ? Be sure to follow the instructions on the label to ensure safe and effective use of the product. Many products recommend keeping the surface wet for several minutes to ensure germs are killed. Many also recommend precautions such as wearing gloves and making sure you have good ventilation during use of the product. ? Most EPA-registered household disinfectants should be effective. How to  discontinue home isolation  People with COVID-19 who have stayed home (home isolated) can stop home isolation under the following conditions: ? If you will not have a test to determine if you are still contagious, you can leave home after these three things have happened:  You have had no fever for at least 72 hours (that is three full days of no fever without the use of medicine that reduces fevers) AND  other symptoms have improved (for example, when your cough or shortness of breath has improved) AND  at least 10 days have passed since your symptoms first appeared. ? If you will be tested to determine if you are still contagious, you can leave home after these three things have happened:  You no longer have a fever (without the use of medicine that reduces fevers) AND  other symptoms have improved (for example, when your cough or shortness of breath has improved) AND  you received two negative tests in a row, 24 hours apart. Your doctor will follow CDC guidelines. In all cases, follow the guidance of your healthcare provider and local health department. The decision to stop home isolation should be made in consultation with your healthcare provider and state and local health departments. Local decisions depend on local circumstances. [email or web address] 12/30/2018 This information is not intended to replace advice given to you by your health care provider. Make sure you discuss any questions you have with your health care provider. Document Released: 12/11/2018 Document Revised: 01/09/2019 Document Reviewed: 12/11/2018 Elsevier Patient Education  [place].

## 2019-08-08 NOTE — TOC Transition Note (Signed)
Transition of Care [facility]) - CM/SW Discharge Note   Patient Details  Name: Valerie Howard MRN: [id number] Date of Birth: 1975/10/30  Transition of Care [facility]) CM/SW Contact:  Lia Hopping, [place] Phone Number: 08/08/2019, 12:52 PM   Clinical Narrative:    Patient cleared by psychiatrist. IVC Commitment of Changed form faxed. CSW attempted to arrange outpatient services with the patient. Patient declined services. CSW put the number to [facility] mental [facility] clinic on [facility].    Final next level of care: Home/Self Care Barriers to Discharge: Barriers Resolved   Patient Goals and CMS Choice     Choice offered to / list presented to : NA  Discharge Placement                       Discharge Plan and Services                DME Arranged: N/A         HH Arranged: NA          Social Determinants of Health (SDOH) Interventions     Readmission Risk Interventions No flowsheet data found.

## 2019-08-08 NOTE — TOC Progression Note (Signed)
Transition of Care [facility]) - Progression Note    Patient Details  Name: Valerie Howard MRN: [id number] Date of Birth: [birth year]  [facility] CM/SW Contact  [name], RN Phone Number: 08/08/2019, 4:17 PM  Clinical Narrative:    Information for [employer] given to pt. Pt may call to have her name placed on waiting list.      Barriers to Discharge: Barriers Resolved  Expected Discharge Plan and Services           Expected Discharge Date: 08/08/19               DME Arranged: N/A         HH Arranged: NA           Social Determinants of Health (SDOH) Interventions    Readmission Risk Interventions No flowsheet data found.

## 2019-08-08 NOTE — Progress Notes (Signed)
Explained to patient that case management was unable to locate a hotel or other facility that will take patient. Patient was presented with an information packet for a shelter in [place]. Patient said "You can just take that with you. Just throw that away. I'll never go to a shelter." RN said "I'll just leave this here in case you change your mind." Patient came over, took packet, tore it up and threw it away. RN told patient that she will be given some time to get dressed and gather belongings and the nurse will return with a wheelchair to escort the patient out. Patient proceeded to swear at nurse, call names and told nurse "By six o'clock tonight you won't have a job because this is not happening." Will return shortly to escort patient to discharge area.

## 2019-08-08 NOTE — Progress Notes (Signed)
Patient discharged from hospital. Patient was able to contact her daughter but her daughter refused to pick her up. Patient was given health department covid regulation and assistance information but she grabbed it from nurse and tore it up.  Nurse gave patient her dinner in to go box but patient dumped it out on the ground stating "You all are going to treat me like a dog, I don't want it." Patient using profanity throughout all interaction. Patient escorted to front of hospital via wheelchair with all belongings.

## 2019-08-08 NOTE — Discharge Summary (Signed)
Physician Discharge Summary  Valerie Howard [id number] DOB: [birth year] DOA: 08/02/2019  PCP: Patient, No Pcp Per  Admit date: 08/02/2019 Discharge date: 08/08/2019  Admitted From: Home Disposition: Home  Recommendations for Outpatient Follow-up:  1. Follow ups as below. 2. Please obtain CBC/BMP/Mag at follow up 3. Please follow up on the following pending results: None  Home Health: None Equipment/Devices: None  Discharge Condition: Stable CODE STATUS: Full code  Follow-up Information    [facility] Follow up.   Contact information: [place] [phone]            Hospital Course: 43 year old female with history of schizophrenia/paranoid behavior brought to ER under involuntary commitment due to belligerent and threatening behavior.  In ED, noted to be febrile to 100.7.  COVID-19 positive.  Pro-Cal negative.  Work-up significant for Hgb to 4.4 g, MCV 66, platelets 63, bicarb 2.  UA, UDS and EtOH level within normal range.  Received 2 units of RBC and admitted.  Psych consulted, started on Zyprexa scheduled and as needed, I recommended inpatient psych.  Patient was transfused 2 units with appropriate response.  Unfortunately, not able to monitor H&H as patient was not cooperative with medical care.  However, she remained hemodynamically stable.  She had no cardiopulmonary, GI or UTI symptoms.  Remained afebrile.   Reevaluated by psych and deemed safe for discharge and outpatient follow-up.  Patient was provided with information resource about COVID-19 and precautions.   See individual problem list below for more on hospital course.  Discharge Diagnoses:  Schizoaffective disorder: Bipolar type.  Reevaluated by psych and deemed safe for discharge and outpatient follow-up. -Discharged on Zyprexa 10 mg nightly -Patient to [facility] at [facility]  COVID-19 infection: relatively asymptomatic from this except mild fever on admission.  No  cardiopulmonary symptoms.  Saturating at 100% on room air.  Hemodynamically stable.  CRP and pro Cal within normal.  Ferritin very low.    Since she is toward the tail end of this infection. -Remdesivir (12/6-12/8).  Refused last dose. -Provided with information about Covid infection and precautions.  Severe iron deficiency anemia in patient with history of KUB (menorrhagia): Hgb 4.4 on admit> 2 units> 7.2.  Ferritin very low at 3.  -Discharged on ferrous sulfate and folic acid with bowel regimen. -Need outpatient follow-up with GYN   Metabolic acidosis: Likely due to dehydration.  Resolved.  Discharge Instructions  Discharge Instructions    Call MD for:  difficulty breathing, headache or visual disturbances   Complete by: As directed    Call MD for:  extreme fatigue   Complete by: As directed    Call MD for:  persistant nausea and vomiting   Complete by: As directed    Call MD for:  severe uncontrolled pain   Complete by: As directed    Diet general   Complete by: As directed      Allergies as of 08/08/2019      Reactions   Penicillins Rash   Has patient had a PCN reaction causing immediate rash, facial/tongue/throat swelling, SOB or lightheadedness with hypotension:Patient refuses to answer (PRA) Has patient had a PCN reaction causing severe rash involving mucus membranes or skin necrosis:PRA Has patient had a PCN reaction that required hospitalization PRA Has patient had a PCN reaction occurring within the last 10 years:PRA If all of the above answers are "NO", then may proceed with Cephalosporin use.      Medication List    TAKE these  medications   ferrous sulfate 325 (65 FE) MG tablet Take 1 tablet (325 mg total) by mouth 2 (two) times daily with a meal.   folic acid 1 MG tablet Commonly known as: FOLVITE Take 1 tablet (1 mg total) by mouth daily. Start taking on: August 09, 2019   OLANZapine 10 MG tablet Commonly known as: ZyPREXA Take 1 tablet (10 mg  total) by mouth at bedtime.   polyethylene glycol powder 17 GM/SCOOP powder Commonly known as: MiraLax Take 17 g by mouth 2 (two) times daily as needed for moderate constipation.   senna-docusate 8.6-50 MG tablet Commonly known as: Senokot-S Take 1 tablet by mouth 2 (two) times daily between meals as needed for mild constipation.       Consultations:  Psychiatry  Procedures/Studies:  2D Echo on none   No results found.    Discharge Exam: Vitals:   08/07/19 2031 08/08/19 0417  BP: (!) 113/59 138/85  Pulse: 92 100  Resp: 14 14  Temp: 99.4 F (37.4 C) 99 F (37.2 C)  SpO2: 100% 100%    GENERAL: No acute distress.  Appears well.  HEENT: MMM.  Vision and hearing grossly intact.  RESP:  No IWOB.  MSK/EXT:  No apparent deformity or edema.  Got up and walked to the bathroom SKIN:  No apparent lesion NEURO: Awake, alert.  No apparent focal neuro deficit. PSYCH: Not cooperative. Got up and walk to the bathroom    The results of significant diagnostics from this hospitalization (including imaging, microbiology, ancillary and laboratory) are listed below for reference.     Microbiology: Recent Results (from the past 240 hour(s))  SARS Coronavirus 2 by RT PCR (hospital order, performed in [facility] hospital lab) Nasopharyngeal Nasopharyngeal Swab     Status: Abnormal   Collection Time: 08/02/19  7:55 PM   Specimen: Nasopharyngeal Swab  Result Value Ref Range Status   SARS Coronavirus 2 POSITIVE (A) NEGATIVE Final    Comment: RESULT CALLED TO, READ BACK BY AND VERIFIED WITH: [employer] AT 0027 ON 08/03/19 BY A,MOHAMED (NOTE) SARS-CoV-2 target nucleic acids are DETECTED SARS-CoV-2 RNA is generally detectable in upper respiratory specimens  during the acute phase of infection.  Positive results are indicative  of the presence of the identified virus, but do not rule out bacterial infection or co-infection with other pathogens not detected by the test.  Clinical  correlation with patient history and  other diagnostic information is necessary to determine patient infection status.  The expected result is negative. Fact Sheet for Patients:   StrictlyIdeas.no  Fact Sheet for Healthcare Providers:   BankingDealers.co.za   This test is not yet approved or cleared by the [place] FDA and  has been authorized for detection and/or diagnosis of SARS-CoV-2 by FDA under an Emergency Use Authorization (EUA).  This EUA will remain in effect (meaning thi s test can be used) for the duration of  the COVID-19 declaration under Section 564(b)(1) of the Act, 21 U.S.C. section 360-bbb-3(b)(1), unless the authorization is terminated or revoked sooner. Performed at [facility], [place] [address] [place], [place] [address]      Labs: BNP (last 3 results) No results for input(s): BNP in the last 8760 hours. Basic Metabolic Panel: Recent Labs  Lab 08/02/19 1955 08/03/19 0526  NA 141 140  K 3.9 4.2  CL 108 109  CO2 21* 24  GLUCOSE 127* 97  BUN 11 13  CREATININE 0.86 0.84  CALCIUM 9.5 8.9  Liver Function Tests: Recent Labs  Lab 08/02/19 1955 08/03/19 0526  AST 13* 11*  ALT 7 8  ALKPHOS 48 39  BILITOT 0.8 0.6  PROT 7.8 6.6  ALBUMIN 3.9 3.4*   No results for input(s): LIPASE, AMYLASE in the last 168 hours. No results for input(s): AMMONIA in the last 168 hours. CBC: Recent Labs  Lab 08/02/19 1955 08/03/19 0526 08/03/19 1528  WBC 9.4 4.4  --   HGB 4.4* 4.6* 7.2*  HCT 19.0* 17.9* 26.0*  MCV 66.0* 67.0*  --   PLT 663* 458*  --    Cardiac Enzymes: No results for input(s): CKTOTAL, CKMB, CKMBINDEX, TROPONINI in the last 168 hours. BNP: Invalid input(s): POCBNP CBG: No results for input(s): GLUCAP in the last 168 hours. D-Dimer Recent Labs    08/07/19 0337  DDIMER 0.54*   Hgb A1c No results for input(s): HGBA1C in the last 72 hours. Lipid Profile No results for  input(s): CHOL, HDL, LDLCALC, TRIG, CHOLHDL, LDLDIRECT in the last 72 hours. Thyroid function studies No results for input(s): TSH, T4TOTAL, T3FREE, THYROIDAB in the last 72 hours.  Invalid input(s): FREET3 Anemia work up Recent Labs    08/07/19 0337  FERRITIN 3*   Urinalysis    Component Value Date/Time   COLORURINE YELLOW 12/06/2011 0034   APPEARANCEUR CLEAR 12/06/2011 0034   LABSPEC 1.021 12/06/2011 0034   PHURINE 6.0 12/06/2011 0034   GLUCOSEU NEGATIVE 12/06/2011 0034   HGBUR LARGE (A) 12/06/2011 0034   BILIRUBINUR NEGATIVE 12/06/2011 0034   KETONESUR NEGATIVE 12/06/2011 0034   PROTEINUR NEGATIVE 12/06/2011 0034   UROBILINOGEN 0.2 12/06/2011 0034   NITRITE NEGATIVE 12/06/2011 0034   LEUKOCYTESUR NEGATIVE 12/06/2011 0034   Sepsis Labs Invalid input(s): PROCALCITONIN,  WBC,  LACTICIDVEN   Time coordinating discharge: 25 minutes  SIGNED:  Mercy Riding, MD  [facility] 08/08/2019, 1:57 PM  If 7PM-7AM, please contact night-coverage [email or web address] Password [email or web address]
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
## 2019-08-08 NOTE — Consult Note (Addendum)
Telepsych Consultation   Reason for Consult:  "Still uncooperative and belligerent" Referring Physician:  Dr Cyndia Skeeters  Location of Patient: [facility] Location of Provider: [facility]  Patient Identification: Valerie Howard MRN:  [id number] Principal Diagnosis: Schizoaffective disorder, bipolar type ([place]) Diagnosis:  Principal Problem:   Schizoaffective disorder, bipolar type ([place]) Active Problems:   Anemia   Symptomatic anemia   Total Time spent with patient: 15 minutes  Subjective:   Valerie Howard is a 43 y.o. female patient admitted with involuntary commitment due to belligerent and threatening behavior. Monitor provided to patient by staff member, patient instantly refuses to participate in assessment.  Patient again refuses to participate in telemedicine consult, states "It ain't shit I need to talk to you about, bitch!" Case discussed with Dr Dwyane Dee.   HPI: Patient presented to emergency department involuntarily committed related to belligerent and threatening behavior, found to be Covid positive.  Past Psychiatric History: Schizoaffective disorder, bipolar type, personality disorder, suicidal ideation  Risk to Self:  Yes Risk to Others:  Yes Prior Inpatient Therapy:  Yes Prior Outpatient Therapy:  Yes  Past Medical History:  Past Medical History:  Diagnosis Date  . History of radiation therapy 07/18/17-07/24/17   right ear, operative bed 12 Gy in 3 fractions  . Keloid    right ear  . Paranoid behavior ([place])   . Schizophrenia [facility])     Past Surgical History:  Procedure Laterality Date  . CESAREAN SECTION    . KENALOG INJECTION Bilateral 07/17/2017   Procedure: KENALOG INJECTION;  Surgeon: Wallace Going, DO;  Location: [facility];  Service: Plastics;  Laterality: Bilateral;  . MASS EXCISION N/A 07/17/2017   Procedure: EXCISION KELOID OF RIGHT EAR WITH INTROPERATIVE KENALOG INJECTION AND POST OP RADIATION;  Surgeon: Wallace Going, DO;  Location: [facility];  Service: Plastics;  Laterality: N/A;  . TUBAL LIGATION     Family History:  Family History  Problem Relation Age of Onset  . Hypertension Other   . Diabetes Other   . Cancer Other   . Keloids Mother   . Breast cancer Maternal Grandmother    Family Psychiatric  History: Unknown Social History:  Social History   Substance and Sexual Activity  Alcohol Use Yes   Comment: socially     Social History   Substance and Sexual Activity  Drug Use No    Social History   Socioeconomic History  . Marital status: Divorced    Spouse name: Not on file  . Number of children: 4  . Years of education: Not on file  . Highest education level: Not on file  Occupational History  . Occupation: unemployed  Tobacco Use  . Smoking status: Never Smoker  . Smokeless tobacco: Never Used  Substance and Sexual Activity  . Alcohol use: Yes    Comment: socially  . Drug use: No  . Sexual activity: Not Currently    Birth control/protection: Surgical  Other Topics Concern  . Not on file  Social History Narrative  . Not on file   Social Determinants of Health   Financial Resource Strain:   . Difficulty of Paying Living Expenses: Not on file  Food Insecurity:   . Worried About Charity fundraiser in the Last Year: Not on file  . Ran Out of Food in the Last Year: Not on file  Transportation Needs:   . Lack of Transportation (Medical): Not on file  . Lack of  Transportation (Non-Medical): Not on file  Physical Activity:   . Days of Exercise per Week: Not on file  . Minutes of Exercise per Session: Not on file  Stress:   . Feeling of Stress : Not on file  Social Connections:   . Frequency of Communication with Friends and Family: Not on file  . Frequency of Social Gatherings with Friends and Family: Not on file  . Attends Religious Services: Not on file  . Active Member of Clubs or Organizations: Not on file  . Attends Archivist  Meetings: Not on file  . Marital Status: Not on file   Additional Social History:    Allergies:   Allergies  Allergen Reactions  . Penicillins Rash    Has patient had a PCN reaction causing immediate rash, facial/tongue/throat swelling, SOB or lightheadedness with hypotension:Patient refuses to answer (PRA) Has patient had a PCN reaction causing severe rash involving mucus membranes or skin necrosis:PRA Has patient had a PCN reaction that required hospitalization PRA Has patient had a PCN reaction occurring within the last 10 years:PRA If all of the above answers are "NO", then may proceed with Cephalosporin use.     Labs:  Results for orders placed or performed during the hospital encounter of 08/02/19 (from the past 48 hour(s))  C-reactive protein     Status: None   Collection Time: 08/07/19  3:37 AM  Result Value Ref Range   CRP 0.6 <1.0 mg/dL    Comment: Performed at [facility], [place] [address], [place], [place] [address]  D-dimer, quantitative (not at [facility])     Status: Abnormal   Collection Time: 08/07/19  3:37 AM  Result Value Ref Range   D-Dimer, Quant 0.54 (H) 0.00 - 0.50 ug/mL-FEU    Comment: (NOTE) At the manufacturer cut-off of 0.50 ug/mL FEU, this assay has been documented to exclude PE with a sensitivity and negative predictive value of 97 to 99%.  At this time, this assay has not been approved by the FDA to exclude DVT/VTE. Results should be correlated with clinical presentation. Performed at [facility], [place] [address], [place], [place] [address]   Ferritin     Status: Abnormal   Collection Time: 08/07/19  3:37 AM  Result Value Ref Range   Ferritin 3 (L) 11 - 307 ng/mL    Comment: Performed at [facility], [place] [address], [place], [place] [address]    Medications:  Current Facility-Administered Medications  Medication Dose Route Frequency Provider Last Rate Last Admin  . 0.9 %  sodium chloride  infusion  10 mL/hr Intravenous Once Valarie Merino, MD      . acetaminophen (TYLENOL) tablet 650 mg  650 mg Oral Q6H PRN Antonieta Pert, MD   650 mg at 08/06/19 1943  . enoxaparin (LOVENOX) injection 40 mg  40 mg Subcutaneous Q24H Gonfa, Taye T, MD      . ferrous sulfate tablet 325 mg  325 mg Oral BID WC Kc, Ramesh, MD   325 mg at 08/08/19 0753  . folic acid (FOLVITE) tablet 1 mg  1 mg Oral Daily Pearlean Brownie, MD   1 mg at 08/08/19 0801  . LORazepam (ATIVAN) injection 1 mg  1 mg Intravenous Q4H PRN Kc, Ramesh, MD      . OLANZapine zydis (ZYPREXA) disintegrating tablet 10 mg  10 mg Oral Q8H PRN Corena Pilgrim, MD       And  . LORazepam (ATIVAN) tablet 1  mg  1 mg Oral PRN Akintayo, Mojeed, MD      . multivitamin with minerals tablet 1 tablet  1 tablet Oral Daily Pearlean Brownie, MD   1 tablet at 08/08/19 0800  . OLANZapine zydis (ZYPREXA) disintegrating tablet 10 mg  10 mg Oral QHS Akintayo, Mojeed, MD      . ondansetron (ZOFRAN) tablet 4 mg  4 mg Oral Q6H PRN Pearlean Brownie, MD       Or  . ondansetron (ZOFRAN) injection 4 mg  4 mg Intravenous Q6H PRN Pearlean Brownie, MD      . polyvinyl alcohol (LIQUIFILM TEARS) 1.4 % ophthalmic solution 1 drop  1 drop Both Eyes PRN Wendee Beavers T, MD   1 drop at 08/07/19 1051  . remdesivir 100 mg in sodium chloride 0.9 % 100 mL IVPB  100 mg Intravenous Daily Minda Ditto, RPH   Stopped at 08/05/19 [id number]  . sodium chloride flush (NS) 0.9 % injection 3 mL  3 mL Intravenous Q12H Pearlean Brownie, MD   3 mL at 08/08/19 0800  . thiamine (VITAMIN B-1) tablet 100 mg  100 mg Oral Daily Pearlean Brownie, MD   100 mg at 08/08/19 0800  . vitamin C (ASCORBIC ACID) tablet 500 mg  500 mg Oral BID Wendee Beavers T, MD   500 mg at 08/08/19 0801  . ziprasidone (GEODON) injection 10 mg  10 mg Intramuscular Q6H PRN Pearlean Brownie, MD        Musculoskeletal: Strength & Muscle Tone: Unable to assess Gait & Station: Unable to assess Patient leans: Unable to assess  Psychiatric Specialty  Exam: Physical Exam  ROS  Blood pressure 138/85, pulse 100, temperature 99 F (37.2 C), temperature source Oral, resp. rate 14, height 5\' 7"  (1.702 m), weight 67.1 kg, SpO2 100 %.Body mass index is 23.18 kg/m.  General Appearance: Unable to assess  Eye Contact:  Unable to assess  Speech:  unable to assess  Volume:  unable to assess  Mood:  unable to assess  Affect:  unable to assess  Thought Process:  NA  Orientation:  Other:  unable to assess  Thought Content:  unable to assess  Suicidal Thoughts:  unable to assess  Homicidal Thoughts:  unable to assess  Memory:  unable to assess  Judgement:  Other:  unable to assess  Insight:  unable to assess  Psychomotor Activity:  unable to assess  Concentration:    Recall:  unable to assess  Fund of Knowledge:  unable to assess  Language:  unable to assess  Akathisia:  unable to assess  Handed:  Right  AIMS (if indicated):     Assets:  Others:  unable to assess  ADL's:    Cognition:    Sleep:        Treatment Plan Summary: Patient would benefit from scheduled and PRN medications  Patient labile and hostile at baseline. Patient does not appear to be a danger to herself or others. Patient monitored x4 days, no crisis criteria noted.  Patient cleared by psychiatry, recommend follow-up with outpatient psychiatry.   Disposition: No evidence of imminent risk to self or others at present.   Patient does not meet criteria for psychiatric inpatient admission. Discussed crisis plan, support from social network, calling 911, coming to the Emergency Department, and calling Suicide Hotline.  This service was provided via telemedicine using a 2-way, interactive audio and video technology.  Names of all persons participating in this telemedicine  service and their role in this encounter. Name: Valerie Howard Role: Patient  Name:Tina Hall Busing Role: [place], FNP 08/08/2019 9:33 AM  Case discussed and plan agreed upon as per Ms  Hall Busing.

## 2019-08-08 NOTE — TOC Progression Note (Signed)
Transition of Care [facility]) - Progression Note    Patient Details  Name: Valerie Howard MRN: [id number] Date of Birth: November 13, 1975  Transition of Care [facility]) CM/SW Contact  [name], RN Phone Number: 08/08/2019, 4:13 PM  Clinical Narrative:    [facility] rep called there are no beds availabel.     Barriers to Discharge: Barriers Resolved  Expected Discharge Plan and Services           Expected Discharge Date: 08/08/19               DME Arranged: N/A         HH Arranged: NA           Social Determinants of Health (SDOH) Interventions    Readmission Risk Interventions No flowsheet data found.

## 2019-08-08 NOTE — TOC Progression Note (Signed)
Transition of Care [facility]) - Progression Note    Patient Details  Name: Valerie Howard MRN: [id number] Date of Birth: 1976-02-20  Transition of Care [facility]) CM/SW Contact  [name], RN Phone Number: 08/08/2019, 2:43 PM  Clinical Narrative:    A call to pt to explain transportation. Was able to say CM name and then there was a silent then a dial tone. A call back to pt's room was made with no answer. Ask floor secretary to call to let pt know CM was trying to call her. Pt states that she did not need to talk with CM, that she already had. When pt did not talk to this CM at all. Will follow up for transportation with RN.      Barriers to Discharge: Barriers Resolved  Expected Discharge Plan and Services           Expected Discharge Date: 08/08/19               DME Arranged: N/A         HH Arranged: NA           Social Determinants of Health (SDOH) Interventions    Readmission Risk Interventions No flowsheet data found.

## 2019-08-08 NOTE — Progress Notes (Signed)
Pt refused all meds and only agreed  for lung assessment and Iv flushed. Patient was able to sleep throughout the night but refused labs to be drawn this morning.

## 2019-08-08 NOTE — Plan of Care (Signed)
Care plan updated. Patient refused auscultation of systems during assessment. Allowed IV flush. Responded to all questions with "Thank you, No" or "No, Thank you." Responded to small talk with "You don't have to keep talking to me. Thank you." Sitter present in room. No s/s unsafe behavior at this time. Will cont to monitor.
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
## 2019-10-02 ENCOUNTER — Emergency Department (HOSPITAL_COMMUNITY)
Admission: EM | Admit: 2019-10-02 | Discharge: 2019-10-03 | Disposition: A | Discharge status: Home / Self Care | Payer: Medicaid Other | Attending: Emergency Medicine | Admitting: Emergency Medicine

## 2019-10-02 ENCOUNTER — Other Ambulatory Visit: Payer: Self-pay

## 2019-10-02 DIAGNOSIS — Z20822 Contact with and (suspected) exposure to covid-19: Secondary | ICD-10-CM | POA: Diagnosis not present

## 2019-10-02 DIAGNOSIS — F609 Personality disorder, unspecified: Secondary | ICD-10-CM | POA: Diagnosis present

## 2019-10-02 DIAGNOSIS — F22 Delusional disorders: Secondary | ICD-10-CM | POA: Diagnosis present

## 2019-10-02 DIAGNOSIS — R45850 Homicidal ideations: Secondary | ICD-10-CM | POA: Insufficient documentation

## 2019-10-02 DIAGNOSIS — F209 Schizophrenia, unspecified: Secondary | ICD-10-CM | POA: Diagnosis not present

## 2019-10-02 DIAGNOSIS — F250 Schizoaffective disorder, bipolar type: Secondary | ICD-10-CM | POA: Diagnosis not present

## 2019-10-02 DIAGNOSIS — D509 Iron deficiency anemia, unspecified: Secondary | ICD-10-CM | POA: Diagnosis not present

## 2019-10-02 DIAGNOSIS — Z79899 Other long term (current) drug therapy: Secondary | ICD-10-CM | POA: Diagnosis not present

## 2019-10-02 DIAGNOSIS — F23 Brief psychotic disorder: Secondary | ICD-10-CM

## 2019-10-02 LAB — CBC WITH DIFFERENTIAL/PLATELET
Abs Immature Granulocytes: 0.09 10*3/uL — ABNORMAL HIGH (ref 0.00–0.07)
Basophils Absolute: 0 10*3/uL (ref 0.0–0.1)
Basophils Relative: 0 %
Eosinophils Absolute: 0 10*3/uL (ref 0.0–0.5)
Eosinophils Relative: 0 %
HCT: 16.9 % — ABNORMAL LOW (ref 36.0–46.0)
Hemoglobin: 4.3 g/dL — CL (ref 12.0–15.0)
Immature Granulocytes: 1 %
Lymphocytes Relative: 19 %
Lymphs Abs: 2.1 10*3/uL (ref 0.7–4.0)
MCH: 18.1 pg — ABNORMAL LOW (ref 26.0–34.0)
MCHC: 25.4 g/dL — ABNORMAL LOW (ref 30.0–36.0)
MCV: 71 fL — ABNORMAL LOW (ref 80.0–100.0)
Monocytes Absolute: 0.7 10*3/uL (ref 0.1–1.0)
Monocytes Relative: 7 %
Neutro Abs: 8.1 10*3/uL — ABNORMAL HIGH (ref 1.7–7.7)
Neutrophils Relative %: 73 %
Platelets: 398 10*3/uL (ref 150–400)
RBC: 2.38 MIL/uL — ABNORMAL LOW (ref 3.87–5.11)
RDW: 33.3 % — ABNORMAL HIGH (ref 11.5–15.5)
WBC: 11 10*3/uL — ABNORMAL HIGH (ref 4.0–10.5)
nRBC: 0.5 % — ABNORMAL HIGH (ref 0.0–0.2)

## 2019-10-02 LAB — COMPREHENSIVE METABOLIC PANEL
ALT: 9 U/L (ref 0–44)
AST: 14 U/L — ABNORMAL LOW (ref 15–41)
Albumin: 3.6 g/dL (ref 3.5–5.0)
Alkaline Phosphatase: 59 U/L (ref 38–126)
Anion gap: 10 (ref 5–15)
BUN: 14 mg/dL (ref 6–20)
CO2: 24 mmol/L (ref 22–32)
Calcium: 8.6 mg/dL — ABNORMAL LOW (ref 8.9–10.3)
Chloride: 103 mmol/L (ref 98–111)
Creatinine, Ser: 0.91 mg/dL (ref 0.44–1.00)
GFR calc Af Amer: >60 mL/min (ref >60)
GFR calc non Af Amer: >60 mL/min (ref >60)
Glucose, Bld: 157 mg/dL — ABNORMAL HIGH (ref 70–99)
Potassium: 3.2 mmol/L — ABNORMAL LOW (ref 3.5–5.1)
Sodium: 137 mmol/L (ref 135–145)
Total Bilirubin: 0.5 mg/dL (ref 0.3–1.2)
Total Protein: 8.1 g/dL (ref 6.5–8.1)

## 2019-10-02 LAB — PREPARE RBC (CROSSMATCH)

## 2019-10-02 LAB — I-STAT BETA HCG BLOOD, ED (MC, WL, AP ONLY): I-stat hCG, quantitative: <5 m[IU]/mL (ref <5)

## 2019-10-02 LAB — ETHANOL: Alcohol, Ethyl (B): <10 mg/dL (ref <10)

## 2019-10-02 MED ORDER — FERROUS SULFATE 325 (65 FE) MG PO TABS
325.0000 mg | ORAL_TABLET | Freq: Two times a day (BID) | ORAL | Status: DC
  Filled 2019-10-02 (×2): qty 1

## 2019-10-02 MED ORDER — SODIUM CHLORIDE (PF) 0.9 % IJ SOLN
INTRAMUSCULAR | Status: AC
  Filled 2019-10-02: qty 10

## 2019-10-02 MED ORDER — OLANZAPINE 10 MG PO TABS
10.0000 mg | ORAL_TABLET | Freq: Every day | ORAL | Status: DC
  Filled 2019-10-02: qty 1

## 2019-10-02 MED ORDER — SODIUM CHLORIDE 0.9 % IV SOLN
10.0000 mL/h | Freq: Once | INTRAVENOUS | Status: AC
  Administered 2019-10-03: 10 mL/h via INTRAVENOUS

## 2019-10-02 MED ORDER — ZIPRASIDONE MESYLATE 20 MG IM SOLR
20.0000 mg | Freq: Once | INTRAMUSCULAR | Status: AC
  Administered 2019-10-02: 20 mg via INTRAMUSCULAR
  Filled 2019-10-02: qty 20

## 2019-10-02 MED ORDER — POLYETHYLENE GLYCOL 3350 17 G PO PACK
17.0000 g | PACK | Freq: Two times a day (BID) | ORAL | Status: DC | PRN
  Filled 2019-10-02: qty 1

## 2019-10-02 MED ORDER — SENNOSIDES-DOCUSATE SODIUM 8.6-50 MG PO TABS: 1.0000 | ORAL_TABLET | Freq: Two times a day (BID) | ORAL | Status: DC | PRN

## 2019-10-02 MED ORDER — FOLIC ACID 1 MG PO TABS
1.0000 mg | ORAL_TABLET | Freq: Every day | ORAL | Status: DC
  Administered 2019-10-03: 1 mg via ORAL
  Filled 2019-10-02: qty 1

## 2019-10-02 NOTE — ED Notes (Signed)
EKG given to EDP,Delo,MD., for review. 
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
## 2019-10-02 NOTE — ED Notes (Signed)
Pt states "I am not answering your questions its the same thing everytime I would never go to school to put those clothes you have on dont talk to me anymore"

## 2019-10-02 NOTE — ED Provider Notes (Addendum)
[facility] DEPT Provider Note   CSN: [id number] Arrival date & time: 10/02/19  1937     History Chief Complaint  Patient presents with  . IVC    Valerie Howard is a 44 y.o. female.  Patient is a 44 year old female with history of schizophrenia and paranoia.  She is brought by authorities today for evaluation of agitation, aggressive behavior, and making threats against others.  An IVC was initiated by an indidual the patient describes as the "wife of her biological father".  She was brought here by authorities against her will and is very uncooperative.  Patient does not want to be here and refuses to add much additional information.  Additional information offered by the petitioner states that patient has made threats against another individual, stating she was going to "put a bullet" in that person.  Also offered in the IVC is that she has not been taking her psychiatric medications and refuses to do so.  The history is provided by the patient.       Past Medical History:  Diagnosis Date  . History of radiation therapy 07/18/17-07/24/17   right ear, operative bed 12 Gy in 3 fractions  . Keloid    right ear  . Paranoid behavior ([place])   . Schizophrenia [facility])     Patient Active Problem List   Diagnosis Date Noted  . Symptomatic anemia 08/03/2019  . Agitation   . Schizophrenia ([place]) 03/06/2019  . Anemia 02/28/2019  . Acute blood loss anemia 02/27/2019  . 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 Schizoaffective disorder, bipolar type ([place])   . Suicidal ideation   . Menorrhagia 05/12/2014  . Personality disorder ([place]) 10/17/2012  . Anemia, iron deficiency 10/13/2012  . Tachycardia 10/01/2012    Past Surgical History:  Procedure Laterality Date  . CESAREAN SECTION    . KENALOG INJECTION Bilateral 07/17/2017   Procedure: KENALOG INJECTION;  Surgeon: Wallace Going, DO;  Location: [place];  Service: Plastics;  Laterality:  Bilateral;  . MASS EXCISION N/A 07/17/2017   Procedure: EXCISION KELOID OF RIGHT EAR WITH INTROPERATIVE KENALOG INJECTION AND POST OP RADIATION;  Surgeon: Wallace Going, DO;  Location: [place];  Service: Plastics;  Laterality: N/A;  . TUBAL LIGATION       OB History   No obstetric history on file.     Family History  Problem Relation Age of Onset  . Hypertension Other   . Diabetes Other   . Cancer Other   . Keloids Mother   . Breast cancer Maternal Grandmother     Social History   Tobacco Use  . Smoking status: Never Smoker  . Smokeless tobacco: Never Used  Substance Use Topics  . Alcohol use: Yes    Comment: socially  . Drug use: No    Home Medications Prior to Admission medications   Medication Sig Start Date End Date Taking? Authorizing Provider  ferrous sulfate 325 (65 FE) MG tablet Take 1 tablet (325 mg total) by mouth 2 (two) times daily with a meal. 08/08/19   Mercy Riding, MD  folic acid (FOLVITE) 1 MG tablet Take 1 tablet (1 mg total) by mouth daily. 08/09/19   Mercy Riding, MD  OLANZapine (ZYPREXA) 10 MG tablet Take 1 tablet (10 mg total) by mouth at bedtime. 08/08/19 02/04/20  Mercy Riding, MD  polyethylene glycol powder (MIRALAX) 17 GM/SCOOP powder Take 17 g by mouth 2 (two) times daily as needed for  moderate constipation. 08/08/19   Mercy Riding, MD  senna-docusate (SENOKOT-S) 8.6-50 MG tablet Take 1 tablet by mouth 2 (two) times daily between meals as needed for mild constipation. 08/08/19   Mercy Riding, MD    Allergies    Penicillins  Review of Systems   Review of Systems  Unable to perform ROS: Psychiatric disorder    Physical Exam Updated Vital Signs BP (!) 147/84 (BP Location: Right Arm)   Pulse (!) 119   Temp 98.3 F (36.8 C) (Oral)   Resp 16   Ht 5\' 7"  (1.702 m)   Wt 59 kg   SpO2 100%   BMI 20.36 kg/m   Physical Exam Vitals and nursing note reviewed.  Constitutional:      General: She is not in acute  distress.    Appearance: She is well-developed. She is not diaphoretic.  HENT:     Head: Normocephalic and atraumatic.  Cardiovascular:     Rate and Rhythm: Normal rate and regular rhythm.     Heart sounds: No murmur. No friction rub. No gallop.   Pulmonary:     Effort: Pulmonary effort is normal. No respiratory distress.     Breath sounds: Normal breath sounds. No wheezing.  Abdominal:     General: Bowel sounds are normal. There is no distension.     Palpations: Abdomen is soft.     Tenderness: There is no abdominal tenderness.  Musculoskeletal:        General: Normal range of motion.     Cervical back: Normal range of motion and neck supple.  Skin:    General: Skin is warm and dry.  Neurological:     Mental Status: She is alert and oriented to person, place, and time.  Psychiatric:        Mood and Affect: Affect is angry.        Speech: Speech is rapid and pressured and tangential.        Behavior: Behavior is agitated, aggressive and hyperactive.        Thought Content: Thought content includes homicidal ideation.        Judgment: Judgment is inappropriate.     ED Results / Procedures / Treatments   Labs (all labs ordered are listed, but only abnormal results are displayed) Labs Reviewed  RESPIRATORY PANEL BY RT PCR (FLU A&B, COVID)  COMPREHENSIVE METABOLIC PANEL  ETHANOL  RAPID URINE DRUG SCREEN, HOSP PERFORMED  CBC WITH DIFFERENTIAL/PLATELET  I-STAT BETA HCG BLOOD, ED ([facility], [facility], [facility] ONLY)    EKG EKG Interpretation  Date/Time:  Wednesday October 02 2019 22:36:49 EST Ventricular Rate:  95 PR Interval:    QRS Duration: 83 QT Interval:  346 QTC Calculation: 435 R Axis:   51 Text Interpretation: Sinus rhythm Consider left ventricular hypertrophy Confirmed by Veryl Speak [phone]) on 10/02/2019 11:09:18 PM   Radiology No results found.  Procedures Procedures (including critical care time)  Medications Ordered in ED Medications  ziprasidone (GEODON) injection  20 mg (has no administration in time range)  sodium chloride (PF) 0.9 % injection (has no administration in time range)    ED Course  I have reviewed the triage vital signs and the nursing notes.  Pertinent labs & imaging results that were available during my care of the patient were reviewed by me and considered in my medical decision making (see chart for details).    MDM Rules/Calculators/A&P  Patient is a 44 year old female with history of schizophrenia and paranoia.  She  is brought by authorities for evaluation of agitation and aggressive behavior.  She has made threats against others and an IVC was filled out by her stepmother.  Patient arrived here extremely agitated and uncooperative.  Patient refusing to allow anyone to take care of her or touch her.  As the patient has allegedly made homicidal threats against others and appears psychotic and agitated, I feel as though she represents a danger to herself and others and the IVC was upheld.  Patient was given 20 mg of IM Geodon and now is resting comfortably.  Patient's work-up returned hemoglobin of 4.3.  Patient has history of chronic anemia and has been transfused in the past.  Her MCV is very low and she is hemodynamically stable, making this with I feel as a chronic, rather than acute blood loss.  Patient will be transfused 2 units of packed red cells and will be evaluated by TTS.  CRITICAL CARE Performed by: Veryl Speak Total critical care time: 35 minutes Critical care time was exclusive of separately billable procedures and treating other patients. Critical care was necessary to treat or prevent imminent or life-threatening deterioration. Critical care was time spent personally by me on the following activities: development of treatment plan with patient and/or surrogate as well as nursing, discussions with consultants, evaluation of patient's response to treatment, examination of patient, obtaining history from patient or  surrogate, ordering and performing treatments and interventions, ordering and review of laboratory studies, ordering and review of radiographic studies, pulse oximetry and re-evaluation of patient's condition.   Final Clinical Impression(s) / ED Diagnoses Final diagnoses:  None    Rx / DC Orders ED Discharge Orders    None       Veryl Speak, MD 10/02/19 2256    Veryl Speak, MD 10/02/19 2309

## 2019-10-02 NOTE — ED Triage Notes (Signed)
Pt arrived via [employer] with IVC paperwork delivered from pts stepmother. Pt in not compliant with triage questions and lab work. Pt in hand cuffs and [employer] at bedside. Pt ambulatory and able to speak in complete sentence no signs of distress.

## 2019-10-02 NOTE — ED Notes (Signed)
IV Cpaperwork at bedside. Delo MD at bedside. [place] scrubs and security at bedside.

## 2019-10-02 NOTE — ED Notes (Signed)
CRITICAL VALUE STICKER  CRITICAL VALUE: 4.3 Hemoglobin  RECEIVER (on-site recipient of call): [place] NOTIFIED: Now   MESSENGER (representative from lab):  MD NOTIFIED: Delo MD  TIME OF NOTIFICATION:  RESPONSE:

## 2019-10-03 ENCOUNTER — Encounter (HOSPITAL_COMMUNITY): Payer: Self-pay | Admitting: Clinical

## 2019-10-03 LAB — RESPIRATORY PANEL BY RT PCR (FLU A&B, COVID)
Influenza A by PCR: NEGATIVE
Influenza B by PCR: NEGATIVE
SARS Coronavirus 2 by RT PCR: NEGATIVE

## 2019-10-03 NOTE — BHH Suicide Risk Assessment (Cosign Needed)
Suicide Risk Assessment  Discharge Assessment   [facility] Discharge Suicide Risk Assessment   Principal Problem: Personality disorder [facility]) Discharge Diagnoses: Principal Problem:   Personality disorder ([place]) Active Problems:   Schizoaffective disorder, bipolar type ([place])   Total Time spent with patient: 30 minutes  Musculoskeletal: Strength & Muscle Tone: within normal limits Gait & Station: normal Patient leans: N/A  Psychiatric Specialty Exam:   Blood pressure (!) 141/82, pulse 96, temperature 99.4 F (37.4 C), temperature source Oral, resp. rate 18, height 5\' 7"  (1.702 m), weight 59 kg, SpO2 98 %.Body mass index is 20.36 kg/m.  General Appearance: Casual  Eye Contact::  Good  Speech:  Clear and Coherent and Normal Rate409  Volume:  Normal  Mood:  Anxious  Affect:  Congruent  Thought Process:  Coherent, Goal Directed and Descriptions of Associations: Intact  Orientation:  Full (Time, Place, and Person)  Thought Content:  Logical  Suicidal Thoughts:  No  Homicidal Thoughts:  No  Memory:  Immediate;   Good Recent;   Good Remote;   Good  Judgement:  Fair  Insight:  Fair  Psychomotor Activity:  Normal  Concentration:  Fair  Recall:  Good  Fund of Knowledge:Good  Language: Good  Akathisia:  No  Handed:  Right  AIMS (if indicated):     Assets:  Communication Skills Desire for Improvement Financial Resources/Insurance Housing Intimacy Leisure Time Physical Health Resilience Social Support Talents/Skills  Sleep:     Cognition: WNL  ADL's:  Intact   Mental Status Per Nursing Assessment::   On Admission:   Patient assessed by nurse practitioner, along with Dr. Dwyane Dee.  Patient denies suicidal and homicidal ideations.  Patient denies history of self-harm.  Patient denies access to weapons.  Patient denies auditory and visual hallucinations.  Patient states "I just really want to go home." Patient irritable at times.  Demographic Factors:  NA  Loss  Factors: NA  Historical Factors: NA  Risk Reduction Factors:   NA  Continued Clinical Symptoms:  Personality Disorders:   Cluster B  Cognitive Features That Contribute To Risk:  None    Suicide Risk:  Minimal: No identifiable suicidal ideation.  Patients presenting with no risk factors but with morbid ruminations; may be classified as minimal risk based on the severity of the depressive symptoms    Plan Of Care/Follow-up recommendations:  Other:  Follow up with outpatient psychiatry  Emmaline Kluver, FNP 10/03/2019, 12:30 PM

## 2019-10-03 NOTE — ED Notes (Signed)
Pt refuses to use BSC and removed ECG leads and BP cuff.

## 2019-10-03 NOTE — Progress Notes (Signed)
CSW received [facility] consult stating patient needs assistance with shelter. CSW spoke with patient at bedside. CSW introduced herself and patient immediately stated "what the f*ck do you want?" in an agitated manner. CSW stated the reason for being consulted which is to help her with homeless resources/a safe place to stay. Patient stated "I don't need that sh*t and I ain't going to no shelter. You can get the f*ck out of here with that dumb sh*t stupid b*tch." Patient continued to cuss as CSW walked out of her room. CSW informed RN. Patient was escorted out by security.   [place], LCSW Transitions of Care Department [facility] ED [phone]

## 2019-10-03 NOTE — ED Notes (Signed)
Pt labile, hostile. Pt resting in bed.

## 2019-10-03 NOTE — ED Notes (Signed)
This Probation officer removed patient's IV, Patient did not want tape placed on arm. Patient removed gauze and saw that her arm was bleeding and put the gauze back on.

## 2019-10-03 NOTE — ED Notes (Signed)
Pt to room 36. Pt oriented to room. Pt irritable, blunted, cooperative with move. Pt continues to be irritated and hostile. Pt resting in bed.

## 2019-10-03 NOTE — ED Notes (Signed)
TTS Clinician attempted assessment, patient refused. Per Anderson Malta, RN, patient has been noncompliant. TTS will attempt at later time.

## 2019-10-03 NOTE — BH Assessment (Signed)
Assessment Note  Valerie Howard is an 44 y.o. female presenting to [facility] ED under IVC. Per IVC: "Respondent is paranoid schizophrenic and does not take medication. Respondent will not take shelter in the cold weather or go with family. Respondent called step-father and left voice mail stating she would put a bullet into someone and that Caleb Popp would die."  IVC upheld by EDP. Upon this counselor's exam patient is agitated and uncooperative with assessment. She states "I don't need any mental health help. I don't need to talk to you or a fucking psychiatrist. I just need a snack. My family took papers out on me. They're the ones that need help." Patient willingly accepts breakfast tray from RN. Patient is noted to be verbally abusive to ED staff. Per chart review patient has accessed [facility] ED and [facility] on numerous occasions with a similar presentation.  Patient is alert and oriented x 3. She is dressed in scrubs, laying in bed. Her speech is abusive, eye contact is poor, and thoughts are circumstantial. Her mood is agitated and her affect is congruent. She has poor insight, judgement, and impulse control. Patient appears paranoid at time of assessment.  Diagnosis: Schizophrenia  Past Medical History:  Past Medical History:  Diagnosis Date  . History of radiation therapy 07/18/17-07/24/17   right ear, operative bed 12 Gy in 3 fractions  . Keloid    right ear  . Paranoid behavior ([place])   . Schizophrenia [facility])     Past Surgical History:  Procedure Laterality Date  . CESAREAN SECTION    . KENALOG INJECTION Bilateral 07/17/2017   Procedure: KENALOG INJECTION;  Surgeon: Wallace Going, DO;  Location: [place];  Service: Plastics;  Laterality: Bilateral;  . MASS EXCISION N/A 07/17/2017   Procedure: EXCISION KELOID OF RIGHT EAR WITH INTROPERATIVE KENALOG INJECTION AND POST OP RADIATION;  Surgeon: Wallace Going, DO;  Location: [place];  Service:  Plastics;  Laterality: N/A;  . TUBAL LIGATION      Family History:  Family History  Problem Relation Age of Onset  . Hypertension Other   . Diabetes Other   . Cancer Other   . Keloids Mother   . Breast cancer Maternal Grandmother     Social History:  reports that she has never smoked. She has never used smokeless tobacco. She reports current alcohol use. She reports that she does not use drugs.  Additional Social History:  Alcohol / Drug Use Pain Medications: see MAR Prescriptions: see MAR Over the Counter: see MAR History of alcohol / drug use?: No history of alcohol / drug abuse  CIWA: CIWA-Ar BP: (!) 97/55 Pulse Rate: 88 COWS:    Allergies:  Allergies  Allergen Reactions  . Penicillins Rash    Has patient had a PCN reaction causing immediate rash, facial/tongue/throat swelling, SOB or lightheadedness with hypotension:Patient refuses to answer (PRA) Has patient had a PCN reaction causing severe rash involving mucus membranes or skin necrosis:PRA Has patient had a PCN reaction that required hospitalization PRA Has patient had a PCN reaction occurring within the last 10 years:PRA If all of the above answers are "NO", then may proceed with Cephalosporin use.     Home Medications: (Not in a hospital admission)   OB/GYN Status:  No LMP recorded.  General Assessment Data Assessment unable to be completed: Yes Reason for not completing assessment: (patient noncompliant and refusing ax) [facility]: [facility] TTS Assessment: In system Is this a Tele  or Face-to-Face Assessment?: Face-to-Face Is this an Initial Assessment or a Re-assessment for this encounter?: Initial Assessment Patient Accompanied by:: N/A Language Other than English: No Living Arrangements: Homeless/Shelter What gender do you identify as?: Female Marital status: Single Maiden name: Whiten Pregnancy Status: No Living Arrangements: Alone Can pt return to current living arrangement?:  Yes Admission Status: Involuntary Petitioner: Family member Is patient capable of signing voluntary admission?: No Referral Source: Self/Family/Friend Insurance type: Medicaid     Crisis Care Plan Living Arrangements: Alone Legal Guardian: (self) Name of Psychiatrist: none Name of Therapist: none  Education Status Is patient currently in school?: No Is the patient employed, unemployed or receiving disability?: Unemployed  Risk to self with the past 6 months Suicidal Ideation: No Has patient been a risk to self within the past 6 months prior to admission? : No Suicidal Intent: No Has patient had any suicidal intent within the past 6 months prior to admission? : No Is patient at risk for suicide?: No Suicidal Plan?: No-Not Currently/Within Last 6 Months Has patient had any suicidal plan within the past 6 months prior to admission? : No Access to Means: No What has been your use of drugs/alcohol within the last 12 months?: UTA Previous Attempts/Gestures: (UTA) How many times?: (UTA) Other Self Harm Risks: (UTA) Triggers for Past Attempts: None known Intentional Self Injurious Behavior: None Family Suicide History: Unable to assess Recent stressful life event(s): Conflict (Comment)(with family) Persecutory voices/beliefs?: No Depression: No Depression Symptoms: Feeling angry/irritable, Isolating Substance abuse history and/or treatment for substance abuse?: No Suicide prevention information given to non-admitted patients: Not applicable  Risk to Others within the past 6 months Homicidal Ideation: Yes-Currently Present Does patient have any lifetime risk of violence toward others beyond the six months prior to admission? : Yes (comment)(per chart) Thoughts of Harm to Others: Yes-Currently Present Comment - Thoughts of Harm to Others: hurting person who took out IVC Current Homicidal Intent: Yes-Currently Present Current Homicidal Plan: Yes-Currently Present Describe  Current Homicidal Plan: shooting Access to Homicidal Means: (UTA) Identified Victim: Caleb Popp History of harm to others?: Yes Assessment of Violence: On admission Violent Behavior Description: assault Does patient have access to weapons?: (UTA) Criminal Charges Pending?: (UTA) Does patient have a court date: (UTA) Is patient on probation?: (UTA)  Psychosis Hallucinations: (UTA) Delusions: Persecutory, Unspecified  Mental Status Report Appearance/Hygiene: In scrubs Eye Contact: Poor Motor Activity: Freedom of movement Speech: Argumentative, Abusive Level of Consciousness: Irritable Mood: Angry Affect: Angry Anxiety Level: None Thought Processes: Unable to Assess Judgement: Impaired Orientation: Person, Place, Time, Situation Obsessive Compulsive Thoughts/Behaviors: None  Cognitive Functioning Concentration: Poor Memory: Unable to Assess Is patient IDD: No Insight: Poor Impulse Control: Poor Appetite: (UTA) Have you had any weight changes? : (UTA) Sleep: Unable to Assess Total Hours of Sleep: (UTA) Vegetative Symptoms: Unable to Assess  ADLScreening [facility] Assessment Services) Patient's cognitive ability adequate to safely complete daily activities?: Yes Patient able to express need for assistance with ADLs?: Yes Independently performs ADLs?: Yes (appropriate for developmental age)  Prior Inpatient Therapy Prior Inpatient Therapy: Yes Prior Therapy Dates: 02/2019 Prior Therapy Facilty/Provider(s): [facility] Reason for Treatment: schizophrenia  Prior Outpatient Therapy Prior Outpatient Therapy: Yes Prior Therapy Dates: (UTA) Prior Therapy Facilty/Provider(s): (UTA) Reason for Treatment: (UTA) Does patient have an ACCT team?: No Does patient have Intensive In-House Services?  : No Does patient have [facility] services? : No Does patient have P4[facility] services?: No  ADL Screening (condition at time of admission) Patient's cognitive ability  adequate to safely  complete daily activities?: Yes Is the patient deaf or have difficulty hearing?: No Does the patient have difficulty seeing, even when wearing glasses/contacts?: No Does the patient have difficulty concentrating, remembering, or making decisions?: No Patient able to express need for assistance with ADLs?: Yes Does the patient have difficulty dressing or bathing?: No Independently performs ADLs?: Yes (appropriate for developmental age) Does the patient have difficulty walking or climbing stairs?: No Weakness of Legs: None Weakness of Arms/Hands: None  Home Assistive Devices/Equipment Home Assistive Devices/Equipment: None  Therapy Consults (therapy consults require a physician order) PT Evaluation Needed: No OT Evalulation Needed: No SLP Evaluation Needed: No Abuse/Neglect Assessment (Assessment to be complete while patient is alone) Abuse/Neglect Assessment Can Be Completed: Unable to assess, patient is non-responsive or altered mental status Values / Beliefs Cultural Requests During Hospitalization: None Spiritual Requests During Hospitalization: None Consults Spiritual Care Consult Needed: No Transition of Care Team Consult Needed: No Advance Directives (For Healthcare) Does Patient Have a Medical Advance Directive?: Unable to assess, patient is non-responsive or altered mental status          Disposition: Pending Disposition Initial Assessment Completed for this Encounter: Yes  On Site Evaluation by:   Reviewed with Physician:    Orvis Brill 10/03/2019 8:43 AM

## 2019-10-03 NOTE — ED Notes (Signed)
Pt refused to do TTS consult

## 2019-10-03 NOTE — Discharge Instructions (Signed)
For your mental health needs, you are advised to follow up with [facility].  Call them at your earliest opportunity to schedule an intake appointment:       [facility]      [address]      [place], [place] [address]      [phone]      Crisis number: [phone]  

## 2019-10-03 NOTE — BH Assessment (Signed)
[name] Assessment Progress Note  Per Otila Kluver, this pt does not require psychiatric hospitalization at this time.  Pt presents under IVC initiated by pt's step-mother and initially upheld by EDP Veryl Speak, MD, but now rescinded by [place], DO.  Pt is to be discharged from [facility] with recommendation to follow up with [facility].  This has been included in pt's discharge instructions.  Pt's nurse, Eustaquio Maize, has been notified.  [name], [place] Triage Specialist [phone]

## 2019-10-03 NOTE — ED Notes (Signed)
Pt DCd off unit to home per provider. Pt agitated, hostile, uncooperative. Pt refused to take or listen to DC instructions. Pt ambulatory, no s/s of distress. Pt escorted off unit by security .

## 2019-10-04 LAB — BPAM RBC
Blood Product Expiration Date: 202102252359
Blood Product Expiration Date: 202103062359
ISSUE DATE / TIME: 202102040039
ISSUE DATE / TIME: 202102040340
Unit Type and Rh: 5100
Unit Type and Rh: 5100

## 2019-10-04 LAB — TYPE AND SCREEN
ABO/RH(D): O POS
Antibody Screen: POSITIVE
Donor AG Type: NEGATIVE
Donor AG Type: NEGATIVE
Unit division: 0
Unit division: 0

## 2019-12-14 IMAGING — DX DG HAND COMPLETE 3+V*L*
3 series · 3 of 3 positions shown · non-contrast
Comparison: None.

CLINICAL DATA: Laceration to the left thumb this morning

EXAM:
LEFT HAND - COMPLETE 3+ VIEW

[hand pa]
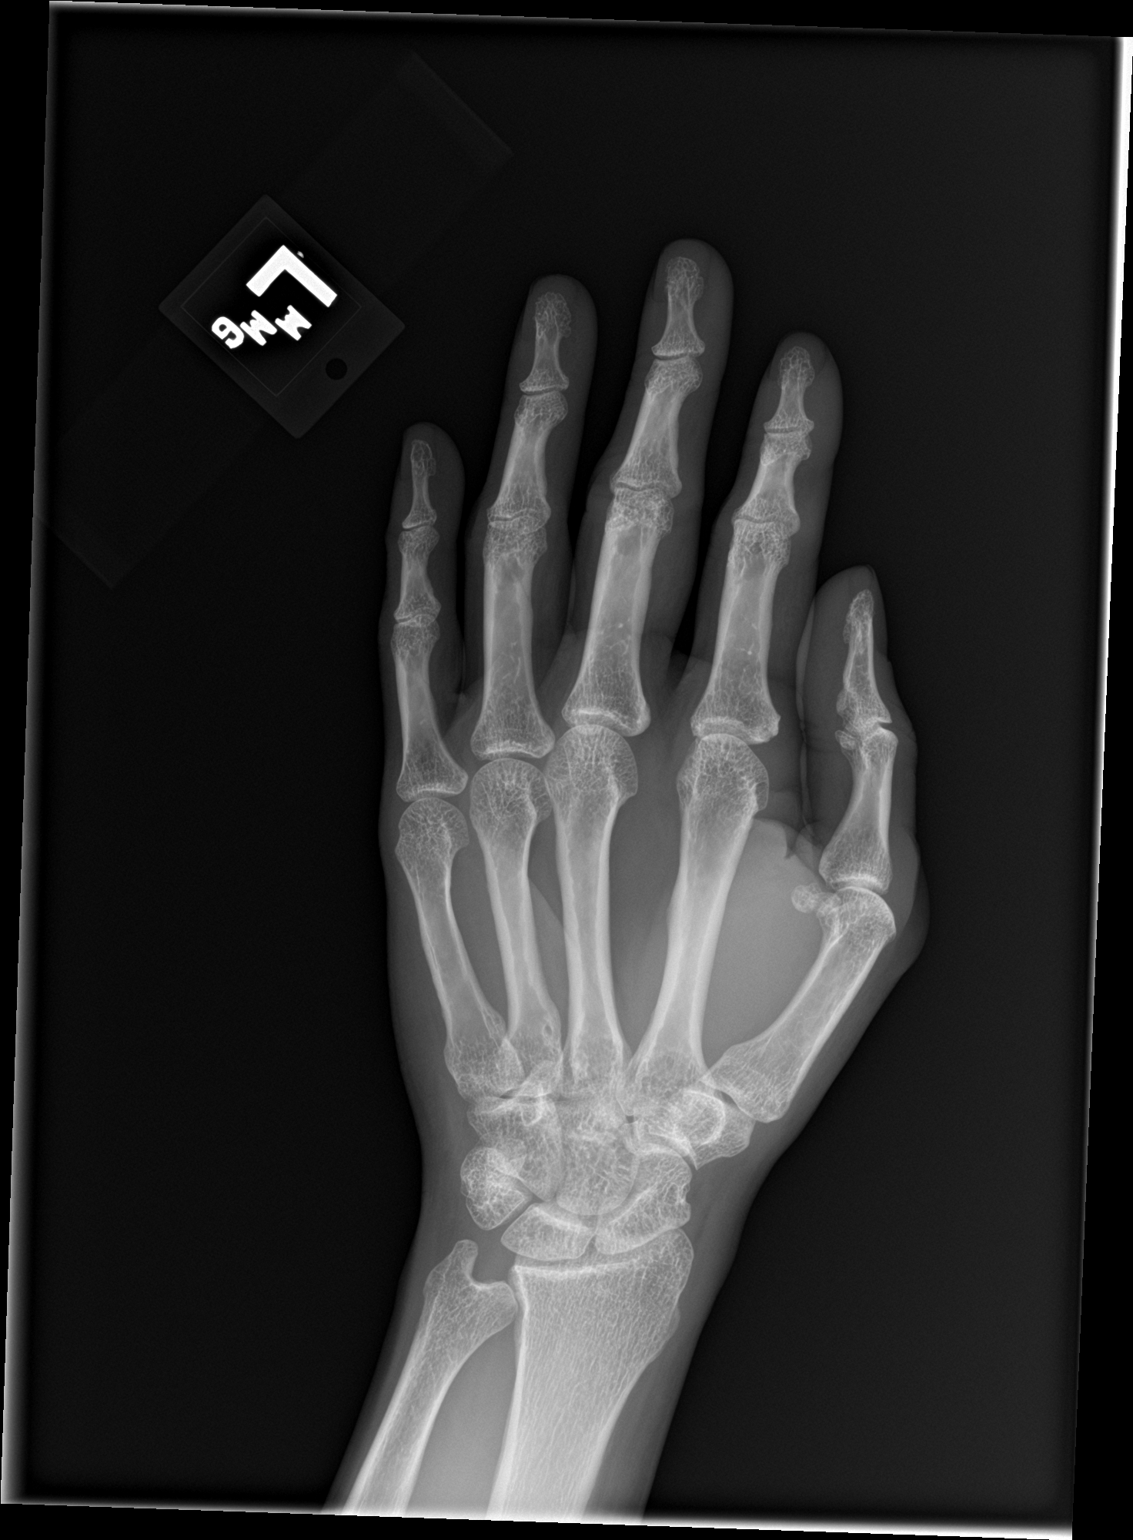

[hand obl]
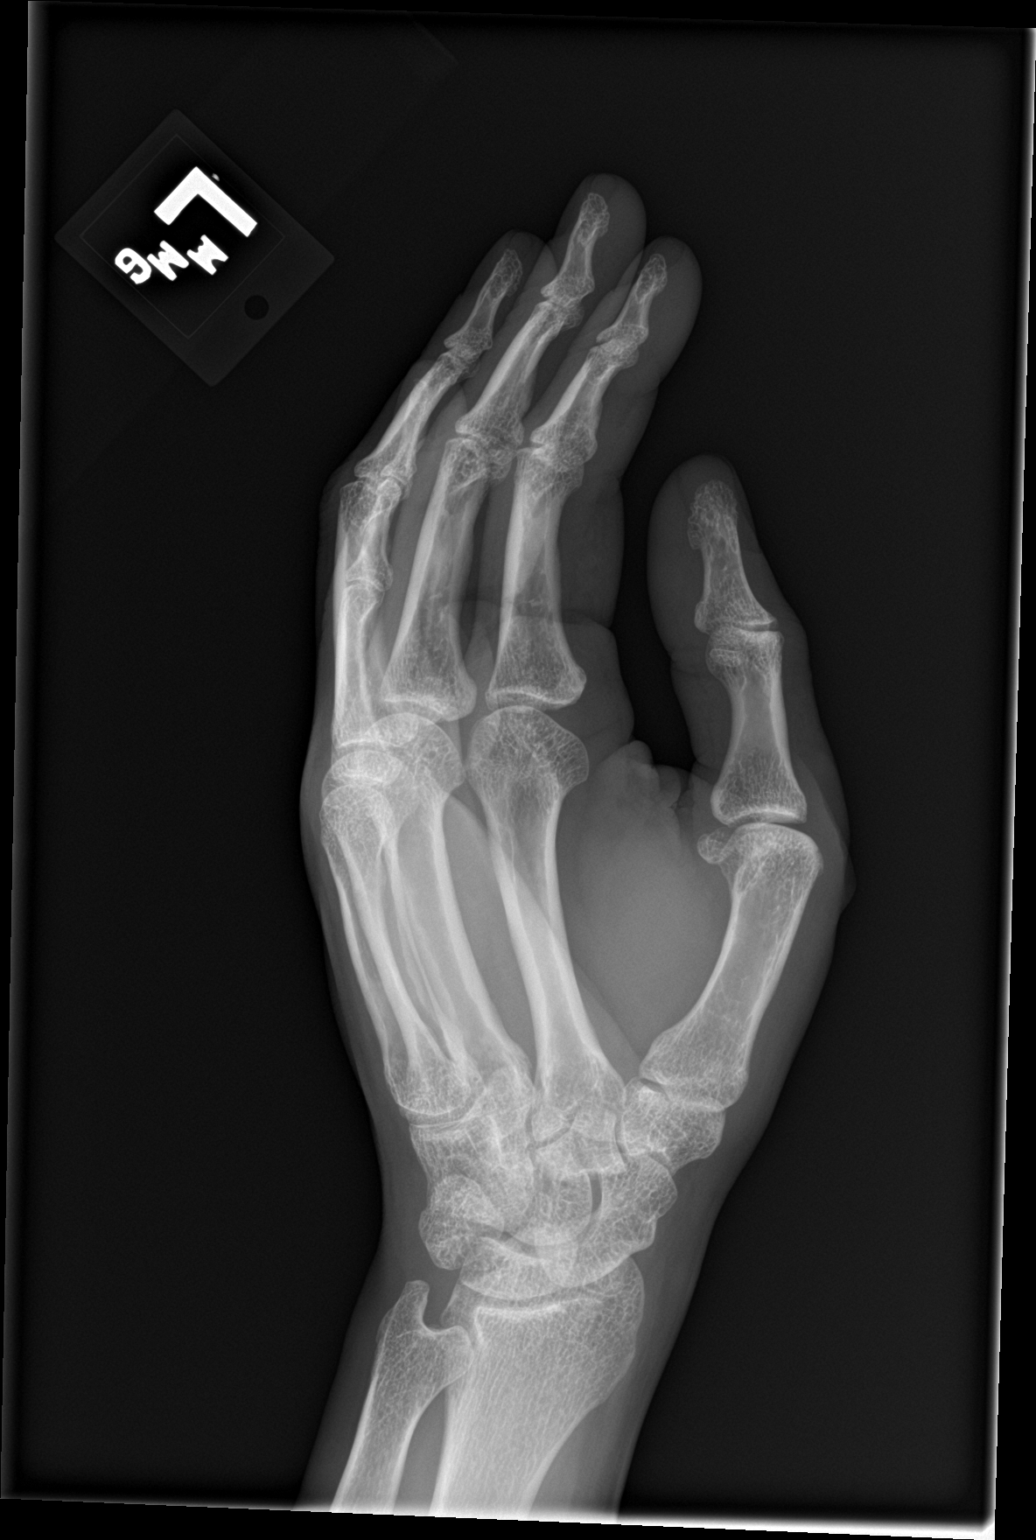

[hand lat]
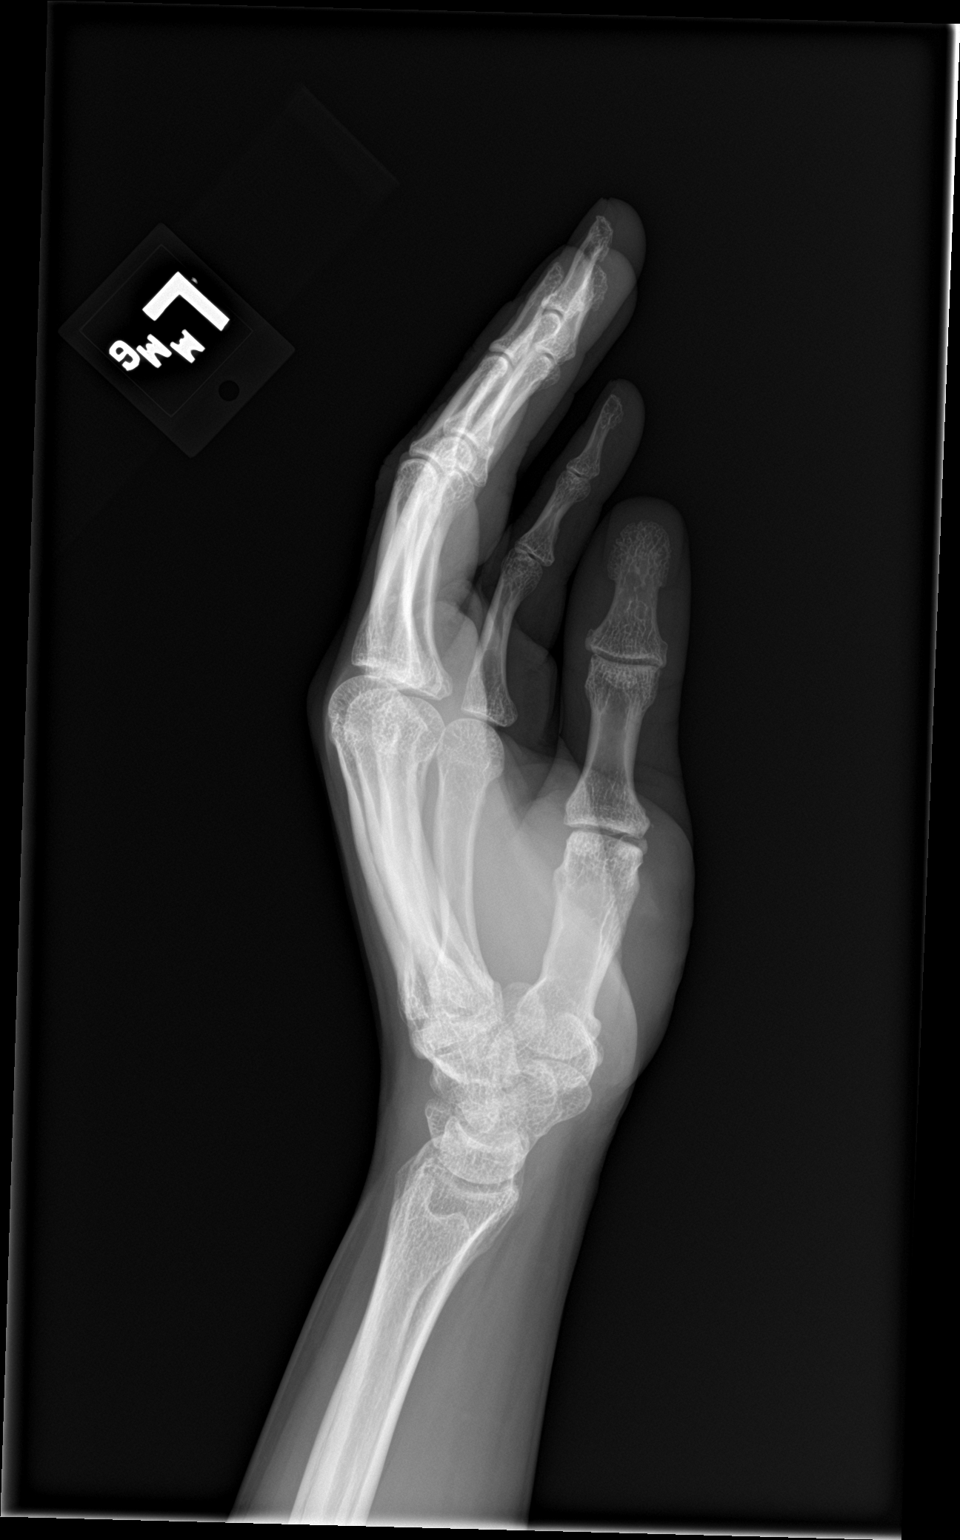

[3 of 3 positions shown; findings below may reference images not displayed]

FINDINGS: No fracture or dislocation. No suspicious focal osseous lesion. No
significant arthropathy. No radiopaque foreign body. No osseous
erosions or periosteal reaction.
IMPRESSION: No fracture, malalignment or radiopaque foreign body.

## 2020-01-31 ENCOUNTER — Emergency Department (HOSPITAL_COMMUNITY): Payer: Medicaid Other

## 2020-01-31 ENCOUNTER — Other Ambulatory Visit: Payer: Self-pay

## 2020-01-31 ENCOUNTER — Inpatient Hospital Stay (HOSPITAL_COMMUNITY)
Admission: EM | Admit: 2020-01-31 | Discharge: 2020-02-21 | DRG: 885 | Disposition: A | Discharge status: Home / Self Care | Payer: Medicaid Other | Attending: Internal Medicine | Admitting: Internal Medicine

## 2020-01-31 ENCOUNTER — Ambulatory Visit (HOSPITAL_BASED_OUTPATIENT_CLINIC_OR_DEPARTMENT_OTHER)
Admission: RE | Admit: 2020-01-31 | Discharge: 2020-01-31 | Disposition: A | Discharge status: Home / Self Care | Payer: Medicaid Other | Source: Home / Self Care | Attending: Psychiatry | Admitting: Psychiatry

## 2020-01-31 ENCOUNTER — Encounter (HOSPITAL_COMMUNITY): Payer: Self-pay | Admitting: Emergency Medicine

## 2020-01-31 DIAGNOSIS — N920 Excessive and frequent menstruation with regular cycle: Secondary | ICD-10-CM

## 2020-01-31 DIAGNOSIS — N921 Excessive and frequent menstruation with irregular cycle: Secondary | ICD-10-CM | POA: Diagnosis present

## 2020-01-31 DIAGNOSIS — D62 Acute posthemorrhagic anemia: Secondary | ICD-10-CM | POA: Diagnosis present

## 2020-01-31 DIAGNOSIS — F209 Schizophrenia, unspecified: Secondary | ICD-10-CM | POA: Diagnosis not present

## 2020-01-31 DIAGNOSIS — R000 Tachycardia, unspecified: Secondary | ICD-10-CM | POA: Diagnosis present

## 2020-01-31 DIAGNOSIS — E46 Unspecified protein-calorie malnutrition: Secondary | ICD-10-CM | POA: Diagnosis present

## 2020-01-31 DIAGNOSIS — F3181 Bipolar II disorder: Secondary | ICD-10-CM | POA: Diagnosis present

## 2020-01-31 DIAGNOSIS — Z833 Family history of diabetes mellitus: Secondary | ICD-10-CM

## 2020-01-31 DIAGNOSIS — Z923 Personal history of irradiation: Secondary | ICD-10-CM

## 2020-01-31 DIAGNOSIS — D519 Vitamin B12 deficiency anemia, unspecified: Secondary | ICD-10-CM | POA: Diagnosis present

## 2020-01-31 DIAGNOSIS — D649 Anemia, unspecified: Secondary | ICD-10-CM

## 2020-01-31 DIAGNOSIS — R45851 Suicidal ideations: Secondary | ICD-10-CM | POA: Diagnosis present

## 2020-01-31 DIAGNOSIS — F22 Delusional disorders: Secondary | ICD-10-CM | POA: Diagnosis present

## 2020-01-31 DIAGNOSIS — Z9114 Patient's other noncompliance with medication regimen: Secondary | ICD-10-CM | POA: Diagnosis not present

## 2020-01-31 DIAGNOSIS — G479 Sleep disorder, unspecified: Secondary | ICD-10-CM | POA: Diagnosis present

## 2020-01-31 DIAGNOSIS — Z590 Homelessness: Secondary | ICD-10-CM

## 2020-01-31 DIAGNOSIS — F250 Schizoaffective disorder, bipolar type: Secondary | ICD-10-CM | POA: Diagnosis present

## 2020-01-31 DIAGNOSIS — Z681 Body mass index (BMI) 19 or less, adult: Secondary | ICD-10-CM | POA: Diagnosis not present

## 2020-01-31 DIAGNOSIS — Z1330 Encounter for screening examination for mental health and behavioral disorders, unspecified: Secondary | ICD-10-CM | POA: Insufficient documentation

## 2020-01-31 DIAGNOSIS — Z803 Family history of malignant neoplasm of breast: Secondary | ICD-10-CM

## 2020-01-31 DIAGNOSIS — D259 Leiomyoma of uterus, unspecified: Secondary | ICD-10-CM | POA: Diagnosis present

## 2020-01-31 DIAGNOSIS — R112 Nausea with vomiting, unspecified: Secondary | ICD-10-CM | POA: Diagnosis present

## 2020-01-31 DIAGNOSIS — D509 Iron deficiency anemia, unspecified: Secondary | ICD-10-CM | POA: Diagnosis present

## 2020-01-31 DIAGNOSIS — Z20822 Contact with and (suspected) exposure to covid-19: Secondary | ICD-10-CM | POA: Diagnosis present

## 2020-01-31 DIAGNOSIS — R4182 Altered mental status, unspecified: Secondary | ICD-10-CM

## 2020-01-31 DIAGNOSIS — Z880 Allergy status to penicillin: Secondary | ICD-10-CM | POA: Diagnosis not present

## 2020-01-31 DIAGNOSIS — R4586 Emotional lability: Secondary | ICD-10-CM | POA: Diagnosis present

## 2020-01-31 DIAGNOSIS — Z8249 Family history of ischemic heart disease and other diseases of the circulatory system: Secondary | ICD-10-CM

## 2020-01-31 LAB — CBC WITH DIFFERENTIAL/PLATELET
Abs Immature Granulocytes: 0.06 10*3/uL (ref 0.00–0.07)
Basophils Absolute: 0 10*3/uL (ref 0.0–0.1)
Basophils Relative: 1 %
Eosinophils Absolute: 0 10*3/uL (ref 0.0–0.5)
Eosinophils Relative: 0 %
HCT: 12 % — ABNORMAL LOW (ref 36.0–46.0)
Hemoglobin: 2.9 g/dL — CL (ref 12.0–15.0)
Immature Granulocytes: 1 %
Lymphocytes Relative: 27 %
Lymphs Abs: 1.7 10*3/uL (ref 0.7–4.0)
MCH: 15.3 pg — ABNORMAL LOW (ref 26.0–34.0)
MCHC: 24.2 g/dL — ABNORMAL LOW (ref 30.0–36.0)
MCV: 63.5 fL — ABNORMAL LOW (ref 80.0–100.0)
Monocytes Absolute: 0.4 10*3/uL (ref 0.1–1.0)
Monocytes Relative: 7 %
Neutro Abs: 3.9 10*3/uL (ref 1.7–7.7)
Neutrophils Relative %: 64 %
Platelets: 243 10*3/uL (ref 150–400)
RBC: 1.89 MIL/uL — ABNORMAL LOW (ref 3.87–5.11)
RDW: 29 % — ABNORMAL HIGH (ref 11.5–15.5)
WBC: 6.1 10*3/uL (ref 4.0–10.5)
nRBC: 0 % (ref 0.0–0.2)

## 2020-01-31 LAB — RETICULOCYTES
Immature Retic Fract: 19 % — ABNORMAL HIGH (ref 2.3–15.9)
RBC.: 1.89 MIL/uL — ABNORMAL LOW (ref 3.87–5.11)
Retic Count, Absolute: 21.9 10*3/uL (ref 19.0–186.0)
Retic Ct Pct: 1.2 % (ref 0.4–3.1)

## 2020-01-31 LAB — IRON AND TIBC
Iron: 12 ug/dL — ABNORMAL LOW (ref 28–170)
Saturation Ratios: 2 % — ABNORMAL LOW (ref 10.4–31.8)
TIBC: 490 ug/dL — ABNORMAL HIGH (ref 250–450)
UIBC: 478 ug/dL

## 2020-01-31 LAB — RAPID URINE DRUG SCREEN, HOSP PERFORMED
Amphetamines: NOT DETECTED
Barbiturates: NOT DETECTED
Benzodiazepines: NOT DETECTED
Cocaine: NOT DETECTED
Opiates: NOT DETECTED
Tetrahydrocannabinol: NOT DETECTED

## 2020-01-31 LAB — ACETAMINOPHEN LEVEL: Acetaminophen (Tylenol), Serum: <10 ug/mL — ABNORMAL LOW (ref 10–30)

## 2020-01-31 LAB — URINALYSIS, ROUTINE W REFLEX MICROSCOPIC
Bilirubin Urine: NEGATIVE
Glucose, UA: NEGATIVE mg/dL
Ketones, ur: 5 mg/dL — AB
Leukocytes,Ua: NEGATIVE
Nitrite: NEGATIVE
Protein, ur: NEGATIVE mg/dL
Specific Gravity, Urine: 1.018 (ref 1.005–1.030)
pH: 5 (ref 5.0–8.0)

## 2020-01-31 LAB — COMPREHENSIVE METABOLIC PANEL
ALT: 7 U/L (ref 0–44)
AST: 11 U/L — ABNORMAL LOW (ref 15–41)
Albumin: 4.2 g/dL (ref 3.5–5.0)
Alkaline Phosphatase: 46 U/L (ref 38–126)
Anion gap: 11 (ref 5–15)
BUN: 21 mg/dL — ABNORMAL HIGH (ref 6–20)
CO2: 23 mmol/L (ref 22–32)
Calcium: 9 mg/dL (ref 8.9–10.3)
Chloride: 104 mmol/L (ref 98–111)
Creatinine, Ser: 1.03 mg/dL — ABNORMAL HIGH (ref 0.44–1.00)
GFR calc Af Amer: >60 mL/min (ref >60)
GFR calc non Af Amer: >60 mL/min (ref >60)
Glucose, Bld: 113 mg/dL — ABNORMAL HIGH (ref 70–99)
Potassium: 3.9 mmol/L (ref 3.5–5.1)
Sodium: 138 mmol/L (ref 135–145)
Total Bilirubin: 0.9 mg/dL (ref 0.3–1.2)
Total Protein: 7.8 g/dL (ref 6.5–8.1)

## 2020-01-31 LAB — HCG, QUANTITATIVE, PREGNANCY: hCG, Beta Chain, Quant, S: <1 m[IU]/mL (ref <5)

## 2020-01-31 LAB — SARS CORONAVIRUS 2 BY RT PCR (HOSPITAL ORDER, PERFORMED IN ~~LOC~~ HOSPITAL LAB): SARS Coronavirus 2: NEGATIVE

## 2020-01-31 LAB — FOLATE: Folate: 15.8 ng/mL (ref >5.9)

## 2020-01-31 LAB — FERRITIN: Ferritin: 1 ng/mL — ABNORMAL LOW (ref 11–307)

## 2020-01-31 LAB — PREPARE RBC (CROSSMATCH)

## 2020-01-31 LAB — VITAMIN B12: Vitamin B-12: 149 pg/mL — ABNORMAL LOW (ref 180–914)

## 2020-01-31 LAB — ETHANOL: Alcohol, Ethyl (B): <10 mg/dL (ref <10)

## 2020-01-31 LAB — SALICYLATE LEVEL: Salicylate Lvl: <7 mg/dL — ABNORMAL LOW (ref 7.0–30.0)

## 2020-01-31 MED ORDER — MORPHINE SULFATE (PF) 2 MG/ML IV SOLN
2.0000 mg | INTRAVENOUS | Status: DC | PRN
  Filled 2020-01-31: qty 1

## 2020-01-31 MED ORDER — THIAMINE HCL 100 MG PO TABS
100.0000 mg | ORAL_TABLET | Freq: Every day | ORAL | Status: DC
  Administered 2020-01-31 – 2020-02-09 (×4): 100 mg via ORAL
  Filled 2020-01-31 (×7): qty 1

## 2020-01-31 MED ORDER — ADULT MULTIVITAMIN W/MINERALS CH
1.0000 | ORAL_TABLET | Freq: Every day | ORAL | Status: DC
  Administered 2020-01-31 – 2020-02-20 (×8): 1 via ORAL
  Filled 2020-01-31 (×13): qty 1

## 2020-01-31 MED ORDER — FOLIC ACID 1 MG PO TABS
1.0000 mg | ORAL_TABLET | Freq: Every day | ORAL | Status: DC
  Administered 2020-01-31 – 2020-02-09 (×4): 1 mg via ORAL
  Filled 2020-01-31 (×8): qty 1

## 2020-01-31 MED ORDER — SODIUM CHLORIDE 0.9 % IV SOLN: 10.0000 mL/h | Freq: Once | INTRAVENOUS | Status: DC
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
## 2020-01-31 NOTE — Progress Notes (Signed)
Patient arrived to unit A&Ox4, ambulatory without assistance.Skin is intact, dry appropriate for ethnicity. Pt currently experiencing hallucinations. Pt stated "they have been putting something in my food that changed my skin color". See VS
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
## 2020-01-31 NOTE — ED Notes (Signed)
Requested urine from patient. 

## 2020-01-31 NOTE — ED Triage Notes (Signed)
Patient came from a hotel by [facility]. Patient is a medical clearance with multiple problems.

## 2020-01-31 NOTE — ED Notes (Signed)
Date and time results received: 01/31/20 0627 (use smartphrase ".now" to insert current time)  Test: Hemoglobin Critical Value: 2.9  Name of Provider Notified: Jarrett Soho, [place]   Orders Received? Or Actions Taken?:

## 2020-01-31 NOTE — H&P (Signed)
History and Physical    Valerie Howard SMO:[id number] DOB: [birth year] DOA: 01/31/2020  PCP: Patient, No Pcp Per   Chief Complaint: Abnormal behavior  HPI: Valerie Howard is a 44 y.o. 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  Patient actually presents to ED with daughters overnight for behavioral health disturbances, apparently living in her car acting paranoid, appears not to be taking medications or following with treatment as previously outlined.  Family indicate patient has been living in her car and occasionally in hotel rooms.  She indicates overnight to staff that she believes her husband is "forcing her to apply for disability and that someone is poisoning her food" patient's history for myself was much less coherent, patient hesitant to discuss her condition with me.  Family overnight indicate patient has difficulty sleeping has not bathed, does not perform ADLs and has had very poor p.o. intake over the past few days.  Family also indicate overnight patient has had large-volume menses for at least 4 days now with large clots but this is a chronic condition for her over at least the last decade if not longer.  Hemoglobin previously has been noted low in the 4-5 range.  Patient declines to me personally any suicidal ideation or plan.  Overnight the daughters indicated to staff that the patient reported "she was going to put a gun to her head"  ED Course: In the ED patient's hemoglobin noted to be markedly low at 2.9 with remarkably low MCV at 63.5.  Creatinine also elevated at 1.03 consistent with history of poor p.o. intake.  Behavioral health involved in the ED, once patient is medically stable will consult again for evaluation although plan overnight appears to have been to discharge to a shelter today which is not possible due to her profound symptomatic anemia.  Review of Systems: Denies nausea, vomiting,  headache, fever, chills, chest pain, shortness of breath.  Assessment/Plan Active Problems:   Symptomatic anemia    Symptomatic anemia, acute blood loss anemia on chronic anemia likely multifactorial - Patient's hemoglobin is profoundly low at 2.9, blood transfusion ordered in the ED - Patient has longstanding history of anemia -likely multifactorial - We will order anemia panel, suspect blood loss anemia more acute process however cannot rule out chronic anemia of chronic disease versus malnutrition and iron deficiency. CBC Latest Ref Rng & Units 01/31/2020 10/02/2019 08/03/2019  WBC 4.0 - 10.5 K/uL 6.1 11.0(H) -  Hemoglobin 12.0 - 15.0 g/dL 2.9(LL) 4.3(LL) 7.2(L)  Hematocrit 36.0 - 46.0 % 12.0(L) 16.9(L) 26.0(L)  Platelets 150 - 400 K/uL 243 398 -   Schizophrenia/paranoia, questionably in acute exacerbation -Patient has longstanding history of schizophrenia, paranoid subtype -It appears patient has been quite noncompliant with medications and follow-up -We will have psych reevaluate patient once medically stable as above -she does not appear to have capacity at this point given she mentions other people are controlling her body making her bleed, making her face to itch, and making decisions for her.  It is unclear whether or not she would be able to dictate her medical care safely.  DVT prophylaxis: None given concern for active bleeding as above Code Status: Full Family Communication: Updated overnight Status is: Inpatient  Dispo: The patient is from: Homeless (sleeping in cars)              Anticipated d/c is to: Unclear, tentative plan overnight was to discharge to shelter which patient appears to  be agreeable to for now              Anticipated d/c date is: Likely 43 to 72 hours pending clinical course, improvement in anemia and symptoms              Patient currently not medically stable for discharge due to ongoing need for blood transfusion, close evaluation and need for safe  disposition prior to discharge  Consultants:   Behavioral health, consult psych once patient's medically stable  Procedures:   None planned   Past Medical History:  Diagnosis Date  . History of radiation therapy 07/18/17-07/24/17   right ear, operative bed 12 Gy in 3 fractions  . Keloid    right ear  . Paranoid behavior ([place])   . Schizophrenia [facility])     Past Surgical History:  Procedure Laterality Date  . CESAREAN SECTION    . KENALOG INJECTION Bilateral 07/17/2017   Procedure: KENALOG INJECTION;  Surgeon: Wallace Going, DO;  Location: [place];  Service: Plastics;  Laterality: Bilateral;  . MASS EXCISION N/A 07/17/2017   Procedure: EXCISION KELOID OF RIGHT EAR WITH INTROPERATIVE KENALOG INJECTION AND POST OP RADIATION;  Surgeon: Wallace Going, DO;  Location: [place];  Service: Plastics;  Laterality: N/A;  . TUBAL LIGATION       reports that she has never smoked. She has never used smokeless tobacco. She reports current alcohol use. She reports that she does not use drugs.  Allergies  Allergen Reactions  . Penicillins Rash    Has patient had a PCN reaction causing immediate rash, facial/tongue/throat swelling, SOB or lightheadedness with hypotension:Patient refuses to answer (PRA) Has patient had a PCN reaction causing severe rash involving mucus membranes or skin necrosis:PRA Has patient had a PCN reaction that required hospitalization PRA Has patient had a PCN reaction occurring within the last 10 years:PRA If all of the above answers are "NO", then may proceed with Cephalosporin use.     Family History  Problem Relation Age of Onset  . Hypertension Other   . Diabetes Other   . Cancer Other   . Keloids Mother   . Breast cancer Maternal Grandmother     Prior to Admission medications   Not on File    Physical Exam: Vitals:   01/31/20 0440 01/31/20 0645 01/31/20 0715 01/31/20 0730  BP: 127/68 138/64 125/62  121/67  Pulse: (!) 101 (!) 107 97 94  Resp: 17 17 15 20   Temp: 99 F (37.2 C)     TempSrc: Oral     SpO2: 100% 100% 100% 100%  Weight: 54.4 kg     Height: 5\' 7"  (1.702 m)       Constitutional: NAD, calm, comfortable Vitals:   01/31/20 0440 01/31/20 0645 01/31/20 0715 01/31/20 0730  BP: 127/68 138/64 125/62 121/67  Pulse: (!) 101 (!) 107 97 94  Resp: 17 17 15 20   Temp: 99 F (37.2 C)     TempSrc: Oral     SpO2: 100% 100% 100% 100%  Weight: 54.4 kg     Height: 5\' 7"  (1.702 m)      General:  Pleasantly resting in bed, No acute distress. HEENT:  Normocephalic atraumatic.  Sclerae nonicteric, noninjected.  Extraocular movements intact bilaterally. Neck:  Without mass or deformity.  Trachea is midline. Lungs:  Clear to auscultate bilaterally without rhonchi, wheeze, or rales. Heart:  Regular rate and rhythm.  Without murmurs, rubs, or gallops. Abdomen:  Soft, nontender,  nondistended.  Without guarding or rebound. Extremities: Without cyanosis, clubbing, edema, or obvious deformity. Vascular:  Dorsalis pedis and posterior tibial pulses palpable bilaterally. Skin:  Warm and dry, no erythema, no ulcerations. Psych: Patient confabulates much of her history dates someone is controlling her body, making her bleed, making her face twitch and making decisions for herself.  At this point it does not appear the patient is able to make decisions safely for herself.  Labs on Admission: I have personally reviewed following labs and imaging studies  CBC: Recent Labs  Lab 01/31/20 0506  WBC 6.1  NEUTROABS 3.9  HGB 2.9*  HCT 12.0*  MCV 63.5*  PLT 176   Basic Metabolic Panel: Recent Labs  Lab 01/31/20 0506  NA 138  K 3.9  CL 104  CO2 23  GLUCOSE 113*  BUN 21*  CREATININE 1.03*  CALCIUM 9.0   GFR: Estimated Creatinine Clearance: 59.2 mL/min (A) (by C-G formula based on SCr of 1.03 mg/dL (H)). Liver Function Tests: Recent Labs  Lab 01/31/20 0506  AST 11*  ALT 7  ALKPHOS 46   BILITOT 0.9  PROT 7.8  ALBUMIN 4.2   No results for input(s): LIPASE, AMYLASE in the last 168 hours. No results for input(s): AMMONIA in the last 168 hours. Coagulation Profile: No results for input(s): INR, PROTIME in the last 168 hours. Cardiac Enzymes: No results for input(s): CKTOTAL, CKMB, CKMBINDEX, TROPONINI in the last 168 hours. BNP (last 3 results) No results for input(s): PROBNP in the last 8760 hours. HbA1C: No results for input(s): HGBA1C in the last 72 hours. CBG: No results for input(s): GLUCAP in the last 168 hours. Lipid Profile: No results for input(s): CHOL, HDL, LDLCALC, TRIG, CHOLHDL, LDLDIRECT in the last 72 hours. Thyroid Function Tests: No results for input(s): TSH, T4TOTAL, FREET4, T3FREE, THYROIDAB in the last 72 hours. Anemia Panel: No results for input(s): VITAMINB12, FOLATE, FERRITIN, TIBC, IRON, RETICCTPCT in the last 72 hours. Urine analysis:    Component Value Date/Time   COLORURINE YELLOW 12/06/2011 0034   APPEARANCEUR CLEAR 12/06/2011 0034   LABSPEC 1.021 12/06/2011 0034   PHURINE 6.0 12/06/2011 0034   GLUCOSEU NEGATIVE 12/06/2011 0034   HGBUR LARGE (A) 12/06/2011 0034   BILIRUBINUR NEGATIVE 12/06/2011 0034   KETONESUR NEGATIVE 12/06/2011 0034   PROTEINUR NEGATIVE 12/06/2011 0034   UROBILINOGEN 0.2 12/06/2011 0034   NITRITE NEGATIVE 12/06/2011 0034   LEUKOCYTESUR NEGATIVE 12/06/2011 0034    Radiological Exams on Admission: DG Chest Port 1 View  Result Date: 01/31/2020 CLINICAL DATA:  Altered mental status EXAM: PORTABLE CHEST 1 VIEW COMPARISON:  None. FINDINGS: The heart size and mediastinal contours are within normal limits. Both lungs are clear. The visualized skeletal structures are unremarkable. IMPRESSION: No active disease. Electronically Signed   By: Prudencio Pair M.D.   On: 01/31/2020 04:34    EKG: Independently reviewed. NSR without overt ST elevations/depressions. QTc WNL.   [facility]  If  7PM-7AM, please contact night-coverage [email or web address]   01/31/2020, 7:54 AM

## 2020-01-31 NOTE — BH Assessment (Signed)
Assessment Note  Valerie Howard is an 44 y.o. female, who presents voluntary and accompanied by her daughters' Valerie Howard and [name]) to [place]. Clinician asked the pt, "what brought you to the hospital?" Pt reported, she does not want mental health assistance. Pt wanted clinician to speak to daughters then to her. Per daughter, the pt is not bathing, she is living in a car on their property (homeless), spilled hot water on her leg, breaks things in their house, pt said (a while ago) she was going to put a gun to her head, and does not feel the pt will be safe outside of [place]. Pt denies, SI, HI, AVH, self-injurious behaviors and access to weapons. Pt reported, not bathing in two months nor eating in 72 hours. Pt was given food while at [facility]. Pt reported, she does not like [place] and said she would blow her brains out then live in [place]. Per daughter, pt is allowed to used their bathroom and eat but does not like to during the time they allot. During the assessment pt asked clinician to call [place].   Pt expressed not needing medications nor wanting medications. Per chart, pt has previous inpatient admissions.  Pt presents alert, irritable, disheveled (pt has a shirt wrapped around her waist while sitting on a blanket) with irritable speech. Pt reported, she is on her period. Pt's mood, affect is irritable. Pt's thought process was circumstantial. Pt was oriented x4. Pt's concentration, insight and impulse control was fair.   Diagnosis:  Schizophrenia ([place]).  Past Medical History:  Past Medical History:  Diagnosis Date  . History of radiation therapy 07/18/17-07/24/17   right ear, operative bed 12 Gy in 3 fractions  . Keloid    right ear  . Paranoid behavior ([place])   . Schizophrenia [facility])     Past Surgical History:  Procedure Laterality Date  . CESAREAN SECTION    . KENALOG INJECTION Bilateral 07/17/2017   Procedure: KENALOG INJECTION;   Surgeon: Wallace Going, DO;  Location: [place];  Service: Plastics;  Laterality: Bilateral;  . MASS EXCISION N/A 07/17/2017   Procedure: EXCISION KELOID OF RIGHT EAR WITH INTROPERATIVE KENALOG INJECTION AND POST OP RADIATION;  Surgeon: Wallace Going, DO;  Location: [place];  Service: Plastics;  Laterality: N/A;  . TUBAL LIGATION      Family History:  Family History  Problem Relation Age of Onset  . Hypertension Other   . Diabetes Other   . Cancer Other   . Keloids Mother   . Breast cancer Maternal Grandmother     Social History:  reports that she has never smoked. She has never used smokeless tobacco. She reports current alcohol use. She reports that she does not use drugs.  Additional Social History:  Alcohol / Drug Use Pain Medications: See MAR Prescriptions: See MAR Over the Counter: See MAR History of alcohol / drug use?: (UTA)  CIWA: CIWA-Ar BP: (!) 145/91 Pulse Rate: (!) 134 COWS:    Allergies:  Allergies  Allergen Reactions  . Penicillins Rash    Has patient had a PCN reaction causing immediate rash, facial/tongue/throat swelling, SOB or lightheadedness with hypotension:Patient refuses to answer (PRA) Has patient had a PCN reaction causing severe rash involving mucus membranes or skin necrosis:PRA Has patient had a PCN reaction that required hospitalization PRA Has patient had a PCN reaction occurring within the last 10 years:PRA If all of the above answers  are "NO", then may proceed with Cephalosporin use.     Home Medications: (Not in a hospital admission)   OB/GYN Status:  No LMP recorded.  General Assessment Data Location of Assessment: [facility] Assessment Services TTS Assessment: In system Is this a Tele or Face-to-Face Assessment?: Face-to-Face Is this an Initial Assessment or a Re-assessment for this encounter?: Initial Assessment Patient Accompanied by:: Other(Daugthers. ) Language Other than English:  No Living Arrangements: Homeless/Shelter(Pt lives in car on property of ex-husband and children. ) What gender do you identify as?: Female Marital status: Divorced Living Arrangements: Other (Comment)(Pt lives in car on property of ex-husband and children. ) Admission Status: Voluntary Is patient capable of signing voluntary admission?: Yes Referral Source: Self/Family/Friend Insurance type: [employer].   Medical Screening Exam ([place]) Medical Exam completed: Yes  Crisis Care Plan Living Arrangements: Other (Comment)(Pt lives in car on property of ex-husband and children. ) Legal Guardian: Other:(Self. ) Name of Psychiatrist: None. Name of Therapist: None.  Education Status Is patient currently in school?: (UTA)  Risk to self with the past 6 months Suicidal Ideation: No(Pt denies. ) Has patient been a risk to self within the past 6 months prior to admission? : Other (comment)(UTA) Suicidal Intent: (UTA) Has patient had any suicidal intent within the past 6 months prior to admission? : Other (comment)(UTA) Is patient at risk for suicide?: No Suicidal Plan?: (UTA) Has patient had any suicidal plan within the past 6 months prior to admission? : (UTA) Access to Means: (UTA) What has been your use of drugs/alcohol within the last 12 months?: UTA Previous Attempts/Gestures: (UTA) Other Self Harm Risks: Homeless. Triggers for Past Attempts: (UTA) Intentional Self Injurious Behavior: None(Pt denies. ) Family Suicide History: Unable to assess Recent stressful life event(s): (UTA) Persecutory voices/beliefs?: (UTA) Depression: (UTA) Depression Symptoms: (UTA) Substance abuse history and/or treatment for substance abuse?: (UTA) Suicide prevention information given to non-admitted patients: Not applicable  Risk to Others within the past 6 months Homicidal Ideation: No(Pt denies. ) Thoughts of Harm to Others: (UTA) Current Homicidal Intent: (UTA) Current  Homicidal Plan: (UTA) Access to Homicidal Means: No Identified Victim: NA Does patient have access to weapons?: No(Pt denies. ) Criminal Charges Pending?: No(Pt denies. )  Psychosis Hallucinations: None noted(Pt denies. ) Delusions: None noted(Pt denies. )  Mental Status Report Appearance/Hygiene: Disheveled Eye Contact: Fair Motor Activity: Unremarkable Level of Consciousness: Alert, Irritable Mood: Preoccupied Anxiety Level: None Thought Processes: Circumstantial Judgement: Partial Orientation: Person, Place, Situation, Time Obsessive Compulsive Thoughts/Behaviors: Minimal  Cognitive Functioning Concentration: Fair Is patient IDD: No Insight: Fair Impulse Control: Fair Appetite: Poor(Pt recently ate after 72 hours. ) Sleep: (Per pt, "enough." )  ADLScreening [facility] Assessment Services) Patient's cognitive ability adequate to safely complete daily activities?: Yes Patient able to express need for assistance with ADLs?: Yes Independently performs ADLs?: Yes (appropriate for developmental age)(Pt has not bathed in two months.)  Prior Inpatient Therapy Prior Inpatient Therapy: Yes Prior Therapy Dates: 07/2019 Prior Therapy Facilty/Provider(s): [facility].  Reason for Treatment: Schizophrenia.   Prior Outpatient Therapy Prior Outpatient Therapy: No Does patient have an ACCT team?: No Does patient have Intensive In-House Services?  : No Does patient have [facility] services? : No Does patient have P4CC services?: No  ADL Screening (condition at time of admission) Patient's cognitive ability adequate to safely complete daily activities?: Yes Is the patient deaf or have difficulty hearing?: (UTA) Does the patient have difficulty seeing, even when wearing glasses/contacts?: (UTA) Does the patient have  difficulty concentrating, remembering, or making decisions?: Yes Patient able to express need for assistance with ADLs?: Yes Does the patient have difficulty dressing or  bathing?: No Independently performs ADLs?: Yes (appropriate for developmental age)(Pt has not bathed in two months.) Does the patient have difficulty walking or climbing stairs?: No Weakness of Legs: (UTA) Weakness of Arms/Hands: (UTA)  Home Assistive Devices/Equipment Home Assistive Devices/Equipment: (UTA)    Abuse/Neglect Assessment (Assessment to be complete while patient is alone) Abuse/Neglect Assessment Can Be Completed: Unable to assess, patient is non-responsive or altered mental status     Advance Directives (For Healthcare) Does Patient Have a Medical Advance Directive?: Unable to assess, patient is non-responsive or altered mental status          Disposition: Adaku Anike, NP recommends OBS however the pt refused. NP, recommend pt does not meet inpatient treatment criteria.   NP spoke to pt's daughters with [employer] at length on inpatient treatment criteria. Per NP, a plan was developed that [place], [employer] was to call tomorrow pt's daughter, to have the pt assessed and the pt possiblely be taken to a shelter. Per NP, pt's daughter agreed to plan however pt's daughter left pt at [facility].     On Site Evaluation by: Vertell Novak, MS, [facility], [facility].  Reviewed with Physician:  Talbot Grumbling, NP.  Vertell Novak 01/31/2020 1:56 AM    Vertell Novak, MS, [facility], [facility] Triage Specialist [phone]

## 2020-02-01 ENCOUNTER — Inpatient Hospital Stay (HOSPITAL_COMMUNITY): Payer: Medicaid Other

## 2020-02-01 ENCOUNTER — Encounter (HOSPITAL_COMMUNITY): Payer: Self-pay | Admitting: Internal Medicine

## 2020-02-01 DIAGNOSIS — D500 Iron deficiency anemia secondary to blood loss (chronic): Secondary | ICD-10-CM

## 2020-02-01 DIAGNOSIS — D509 Iron deficiency anemia, unspecified: Secondary | ICD-10-CM

## 2020-02-01 DIAGNOSIS — N921 Excessive and frequent menstruation with irregular cycle: Secondary | ICD-10-CM

## 2020-02-01 DIAGNOSIS — D259 Leiomyoma of uterus, unspecified: Secondary | ICD-10-CM

## 2020-02-01 DIAGNOSIS — R112 Nausea with vomiting, unspecified: Secondary | ICD-10-CM

## 2020-02-01 DIAGNOSIS — D62 Acute posthemorrhagic anemia: Secondary | ICD-10-CM

## 2020-02-01 DIAGNOSIS — F250 Schizoaffective disorder, bipolar type: Principal | ICD-10-CM

## 2020-02-01 LAB — BASIC METABOLIC PANEL
Anion gap: 7 (ref 5–15)
BUN: 13 mg/dL (ref 6–20)
CO2: 26 mmol/L (ref 22–32)
Calcium: 8.4 mg/dL — ABNORMAL LOW (ref 8.9–10.3)
Chloride: 103 mmol/L (ref 98–111)
Creatinine, Ser: 0.74 mg/dL (ref 0.44–1.00)
GFR calc Af Amer: >60 mL/min (ref >60)
GFR calc non Af Amer: >60 mL/min (ref >60)
Glucose, Bld: 102 mg/dL — ABNORMAL HIGH (ref 70–99)
Potassium: 3.7 mmol/L (ref 3.5–5.1)
Sodium: 136 mmol/L (ref 135–145)

## 2020-02-01 LAB — CBC
HCT: 26 % — ABNORMAL LOW (ref 36.0–46.0)
HCT: 27.2 % — ABNORMAL LOW (ref 36.0–46.0)
Hemoglobin: 7.7 g/dL — ABNORMAL LOW (ref 12.0–15.0)
Hemoglobin: 8.2 g/dL — ABNORMAL LOW (ref 12.0–15.0)
MCH: 23.1 pg — ABNORMAL LOW (ref 26.0–34.0)
MCH: 23.6 pg — ABNORMAL LOW (ref 26.0–34.0)
MCHC: 29.6 g/dL — ABNORMAL LOW (ref 30.0–36.0)
MCHC: 30.1 g/dL (ref 30.0–36.0)
MCV: 78.1 fL — ABNORMAL LOW (ref 80.0–100.0)
MCV: 78.4 fL — ABNORMAL LOW (ref 80.0–100.0)
Platelets: 234 10*3/uL (ref 150–400)
Platelets: 261 10*3/uL (ref 150–400)
RBC: 3.33 MIL/uL — ABNORMAL LOW (ref 3.87–5.11)
RBC: 3.47 MIL/uL — ABNORMAL LOW (ref 3.87–5.11)
RDW: 28.7 % — ABNORMAL HIGH (ref 11.5–15.5)
RDW: 28.6 % — ABNORMAL HIGH (ref 11.5–15.5)
WBC: 6.2 10*3/uL (ref 4.0–10.5)
WBC: 5.9 10*3/uL (ref 4.0–10.5)
nRBC: 0.3 % — ABNORMAL HIGH (ref 0.0–0.2)
nRBC: 0.5 % — ABNORMAL HIGH (ref 0.0–0.2)

## 2020-02-01 MED ORDER — LACTATED RINGERS IV SOLN: INTRAVENOUS | Status: DC

## 2020-02-01 MED ORDER — MEGESTROL ACETATE 40 MG PO TABS
40.0000 mg | ORAL_TABLET | Freq: Two times a day (BID) | ORAL | Status: DC
  Administered 2020-02-02: 40 mg via ORAL
  Filled 2020-02-01 (×41): qty 1

## 2020-02-01 MED ORDER — ONDANSETRON HCL 4 MG/2ML IJ SOLN
4.0000 mg | INTRAMUSCULAR | Status: DC | PRN
  Filled 2020-02-01: qty 2

## 2020-02-01 MED ORDER — ACETAMINOPHEN 650 MG RE SUPP
650.0000 mg | RECTAL | Status: DC | PRN
  Administered 2020-02-20: 650 mg via RECTAL

## 2020-02-01 MED ORDER — ACETAMINOPHEN 325 MG PO TABS
650.0000 mg | ORAL_TABLET | ORAL | Status: DC | PRN
  Administered 2020-02-01 – 2020-02-20 (×4): 650 mg via ORAL
  Filled 2020-02-01 (×6): qty 2
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
## 2020-02-01 NOTE — Progress Notes (Signed)
Patient completed  last unit of blood, tolerated well. On admission, patient refuses skin assessment. Valerie Howard is only bug bites from sleeping outside, that she can see it as she looks at me, she is looking at other people and not that she is seeing only me. That her skin is darker from the kind of water they gave her. Looks like it  from funeral home and she can taste it. Patient c/o abd pain, vomited once. Morphine and zofran offered, patient refuses, ask for [facility] and said [facility] will get it right. Patient later took tylenol and is resting comfortable at this time.

## 2020-02-01 NOTE — Progress Notes (Signed)
Pt refusing CBC draw for second attempt now.

## 2020-02-01 NOTE — Consult Note (Addendum)
Impression: Menorrhagia Fibroid uterus Chronic anemia due to blood loss Hgb 2.9-->up to 8.2.[name]7.9 this am and appears relatively stable B12 deficiency Schizophrenia  Recommendations: Will need endometrial sampling as an outpatient.  Megace 40 mg BID for on-going bleeding Outpatient follow-up for fibroids Replete iron and B12 per primary team Clearly needs on-going psychiatric treatment if we are going to have meaningful progress in improving her medical issues.  Reason for consult: Patient is a 44 y.o. No obstetric history on file. female who was admitted with significant anemia requiring blood transfusion. According to the chart, she has heavy menses and heavy bleeding 4 days prior to admission. Reportedly no longer bleeding We are asked to see the patient regarding her on-going bleeding. I stepped into the room introduced myself, discussed her u/s results of fibroids and that this is likely causing her bleeding and that we would want to see her as an outpatient to follow these findings up and she sent me from her room at the mention of coming to a doctor's office. We did not get to how much bleeding she was having now.   Past Medical History:  Diagnosis Date  . History of radiation therapy 07/18/17-07/24/17   right ear, operative bed 12 Gy in 3 fractions  . Keloid    right ear  . Paranoid behavior ([place])   . Schizophrenia [facility])     Past Surgical History:  Procedure Laterality Date  . CESAREAN SECTION    . KENALOG INJECTION Bilateral 07/17/2017   Procedure: KENALOG INJECTION;  Surgeon: Wallace Going, DO;  Location: [facility];  Service: Plastics;  Laterality: Bilateral;  . MASS EXCISION N/A 07/17/2017   Procedure: EXCISION KELOID OF RIGHT EAR WITH INTROPERATIVE KENALOG INJECTION AND POST OP RADIATION;  Surgeon: Wallace Going, DO;  Location: [facility];  Service: Plastics;  Laterality: N/A;  . TUBAL LIGATION      Family History    Problem Relation Age of Onset  . Hypertension Other   . Diabetes Other   . Cancer Other   . Keloids Mother   . Breast cancer Maternal Grandmother     Social History   Socioeconomic History  . Marital status: Divorced    Spouse name: Not on file  . Number of children: 4  . Years of education: Not on file  . Highest education level: Not on file  Occupational History  . Occupation: unemployed  Tobacco Use  . Smoking status: Never Smoker  . Smokeless tobacco: Never Used  Substance and Sexual Activity  . Alcohol use: Yes    Comment: socially  . Drug use: No  . Sexual activity: Not Currently    Birth control/protection: Surgical  Other Topics Concern  . Not on file  Social History Narrative  . Not on file   Social Determinants of Health   Financial Resource Strain:   . Difficulty of Paying Living Expenses:   Food Insecurity:   . Worried About Charity fundraiser in the Last Year:   . Arboriculturist in the Last Year:   Transportation Needs:   . Film/video editor (Medical):   [name] Lack of Transportation (Non-Medical):   Physical Activity:   . Days of Exercise per Week:   . Minutes of Exercise per Session:   Stress:   . Feeling of Stress :   Social Connections:   . Frequency of Communication with Friends and Family:   . Frequency of Social Gatherings with Friends  and Family:   . Attends Religious Services:   . Active Member of Clubs or Organizations:   . Attends Archivist Meetings:   [name] Marital Status:   Intimate Partner Violence:   . Fear of Current or Ex-Partner:   . Emotionally Abused:   [name] Physically Abused:   . Sexually Abused:     . folic acid  1 mg Oral Daily  . megestrol  40 mg Oral BID  . multivitamin with minerals  1 tablet Oral Daily  . thiamine  100 mg Oral Daily    Allergies  Allergen Reactions  . Penicillins Hives and Itching    Has patient had a PCN reaction causing immediate rash, facial/tongue/throat swelling, SOB or  lightheadedness with hypotension:Patient refuses to answer (PRA) Has patient had a PCN reaction causing severe rash involving mucus membranes or skin necrosis:PRA Has patient had a PCN reaction that required hospitalization PRA Has patient had a PCN reaction occurring within the last 10 years:PRA If all of the above answers are "NO", then may proceed with Cephalosporin use.     Review of Systems - unable to obtain history from patient  Exam Vitals:   02/01/20 0939 02/01/20 1306  BP: 113/70 109/65  Pulse: 75 74  Resp: 20 17  Temp: 99.1 F (37.3 C) 98.6 F (37 C)  SpO2: 100% 100%    Physical Examination: General appearance - chronically ill appearing and underweight Chest - normal effort Heart - normal rate and regular rhythm Abdomen - unable to complete exam due to patient refusal  Labs:  CBC    Component Value Date/Time   WBC 5.9 02/01/2020 1211   RBC 3.47 (L) 02/01/2020 1211   HGB 8.2 (L) 02/01/2020 1211   HCT 27.2 (L) 02/01/2020 1211   PLT 261 02/01/2020 1211   MCV 78.4 (L) 02/01/2020 1211   MCH 23.6 (L) 02/01/2020 1211   MCHC 30.1 02/01/2020 1211   RDW 28.6 (H) 02/01/2020 1211   LYMPHSABS 1.7 01/31/2020 0506   MONOABS 0.4 01/31/2020 0506   EOSABS 0.0 01/31/2020 0506   BASOSABS 0.0 01/31/2020 0506    CMP     Component Value Date/Time   NA 136 02/01/2020 0404   K 3.7 02/01/2020 0404   CL 103 02/01/2020 0404   CO2 26 02/01/2020 0404   GLUCOSE 102 (H) 02/01/2020 0404   BUN 13 02/01/2020 0404   CREATININE 0.74 02/01/2020 0404   CALCIUM 8.4 (L) 02/01/2020 0404   PROT 7.8 01/31/2020 0506   ALBUMIN 4.2 01/31/2020 0506   AST 11 (L) 01/31/2020 0506   ALT 7 01/31/2020 0506   ALKPHOS 46 01/31/2020 0506   BILITOT 0.9 01/31/2020 0506   GFRNONAA >60 02/01/2020 0404   GFRAA >60 02/01/2020 0404    Lab Results  Component Value Date   TSH 2.384 02/27/2019    No components found for: URINALYSIS  Lab Results  Component Value Date   PREGTESTUR NEGATIVE  03/07/2017    Radiological Studies DG Chest Port 1 View  Result Date: 01/31/2020 CLINICAL DATA:  Altered mental status EXAM: PORTABLE CHEST 1 VIEW COMPARISON:  None. FINDINGS: The heart size and mediastinal contours are within normal limits. Both lungs are clear. The visualized skeletal structures are unremarkable. IMPRESSION: No active disease. Electronically Signed   By: Prudencio Pair M.D.   On: 01/31/2020 04:34   US PELVIC COMPLETE WITH TRANSVAGINAL  Result Date: 02/01/2020 CLINICAL DATA:  Patient with menorrhagia. EXAM: TRANSABDOMINAL AND TRANSVAGINAL ULTRASOUND OF PELVIS TECHNIQUE: Both transabdominal  and transvaginal ultrasound examinations of the pelvis were performed. Transabdominal technique was performed for global imaging of the pelvis including uterus, ovaries, adnexal regions, and pelvic cul-de-sac. It was necessary to proceed with endovaginal exam following the transabdominal exam to visualize the endometrium. COMPARISON:  None FINDINGS: Uterus Measurements: 13.6 x 9.2 x 12.8 cm = volume: 838.1 mL. Multiple fibroids are demonstrated. There is a 6.2 x 7.3 x 7.0 cm heterogeneous fibroid within the lower left uterine segment. There is a 4.1 x 4.6 x 4.0 cm fibroid within the posterior uterine body. There is a 3.5 x 3.0 x 2.7 cm fibroid within the anterior uterine body. Multiple additional uterine fibroids are demonstrated. Endometrium Not well evaluated secondary to multiple uterine fibroids. Right ovary Not visualized. Left ovary Not visualized. Other findings No abnormal free fluid. IMPRESSION: Multiple uterine fibroids are demonstrated. The endometrium is not able to be assessed or well visualized secondary to multiple uterine fibroids. Electronically Signed   By: Lovey Newcomer M.D.   On: 02/01/2020 15:55    Thank you so much for allowing [place] to participate in the care of this patient.  We will continue sign off for now. Please call the attending OB/GYN physician with questions or concerns at  [phone] M-F 8a-5p, after hours and on weekends, we can be reached at [phone].

## 2020-02-02 DIAGNOSIS — F22 Delusional disorders: Secondary | ICD-10-CM

## 2020-02-02 LAB — TYPE AND SCREEN
ABO/RH(D): O POS
Antibody Screen: NEGATIVE
Donor AG Type: NEGATIVE
Donor AG Type: NEGATIVE
Donor AG Type: NEGATIVE
Unit division: 0
Unit division: 0
Unit division: 0

## 2020-02-02 LAB — CBC
HCT: 26.4 % — ABNORMAL LOW (ref 36.0–46.0)
Hemoglobin: 7.9 g/dL — ABNORMAL LOW (ref 12.0–15.0)
MCH: 23.4 pg — ABNORMAL LOW (ref 26.0–34.0)
MCHC: 29.9 g/dL — ABNORMAL LOW (ref 30.0–36.0)
MCV: 78.1 fL — ABNORMAL LOW (ref 80.0–100.0)
Platelets: 293 10*3/uL (ref 150–400)
RBC: 3.38 MIL/uL — ABNORMAL LOW (ref 3.87–5.11)
RDW: 28.8 % — ABNORMAL HIGH (ref 11.5–15.5)
WBC: 5.3 10*3/uL (ref 4.0–10.5)
nRBC: 0.6 % — ABNORMAL HIGH (ref 0.0–0.2)

## 2020-02-02 LAB — BPAM RBC
Blood Product Expiration Date: 202107062359
Blood Product Expiration Date: 202107062359
Blood Product Expiration Date: 202107062359
ISSUE DATE / TIME: 202106040808
ISSUE DATE / TIME: 202106041228
ISSUE DATE / TIME: 202106041703
Unit Type and Rh: 5100
Unit Type and Rh: 5100
Unit Type and Rh: 5100

## 2020-02-02 MED ORDER — BENZTROPINE MESYLATE 0.5 MG PO TABS
1.0000 mg | ORAL_TABLET | Freq: Every day | ORAL | Status: DC
  Filled 2020-02-02 (×4): qty 2

## 2020-02-02 MED ORDER — HALOPERIDOL 5 MG PO TABS
5.0000 mg | ORAL_TABLET | Freq: Two times a day (BID) | ORAL | Status: DC
  Filled 2020-02-02 (×24): qty 1

## 2020-02-02 MED ORDER — HALOPERIDOL LACTATE 5 MG/ML IJ SOLN
5.0000 mg | Freq: Two times a day (BID) | INTRAMUSCULAR | Status: DC
  Administered 2020-02-13: 5 mg via INTRAMUSCULAR
  Filled 2020-02-02 (×2): qty 1

## 2020-02-02 MED ORDER — VITAMIN B-12 1000 MCG PO TABS
1000.0000 ug | ORAL_TABLET | Freq: Every day | ORAL | Status: DC
  Administered 2020-02-09: 1000 ug via ORAL
  Filled 2020-02-02 (×6): qty 1

## 2020-02-02 NOTE — Progress Notes (Signed)
Pt noted vomiting, refuses  med and ask nurse  to get out of her room. Refuses night time med at this time. Valerie Pitcher do not come with that psych shit bitch.

## 2020-02-02 NOTE — Progress Notes (Signed)
PROGRESS NOTE    Valerie Howard  TKP:[id number] DOB: 1976/06/12 DOA: 01/31/2020 PCP: Patient, No Pcp Per     Brief Narrative:  Valerie Howard is a 44 y.o. 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  Patient actually presents to ED with daughters overnight for behavioral health disturbances, apparently living in her car acting paranoid, appears not to be taking medications or following with treatment as previously outlined. She indicates overnight to staff that she believes her husband is "forcing her to apply for disability and that someone is poisoning her food" patient's history for myself was much less coherent, patient hesitant to discuss her condition with me.  Family overnight indicate patient has difficulty sleeping has not bathed, does not perform ADLs and has had very poor p.o. intake over the past few days.  Family also indicate overnight patient has had large-volume menses for at least 4 days now with large clots but this is a chronic condition for her over at least the last decade if not longer.  Hemoglobin previously has been noted low in the 4-5 range.  Patient declines to me personally any suicidal ideation or plan.  Overnight the daughters indicated to staff that the patient reported "she was going to put a gun to her head"   In the ED patient's hemoglobin noted to be markedly low at 2.9 with remarkably low MCV at 63.5.  Creatinine also elevated at 1.03 consistent with history of poor p.o. intake.  Behavioral health involved in the ED, once patient is medically stable will consult again for evaluation although plan overnight appears to have been to discharge to a shelter today which is not possible due to her profound symptomatic anemia.  6/6 - Patient seen by gynecology. Chronic anemia/ acute blood loss anemia secondary to fibroids. Patient has been agitated all day and has not allowed staff, including  Dr. Kennon Rounds, gynecology, to explain her disease process and/or options for treatment. Psychiatry has been consulted now that she is medically stable with stable hemoglobin.   Assessment & Plan:   Active Problems:   Anemia, iron deficiency   Schizoaffective disorder, bipolar type (HCC)   Menorrhagia   Acute blood loss anemia   Symptomatic anemia   Nausea and vomiting   #Acute blood loss anemia/menorrhagia/symptomatic anemia - Patient presented with hgb of 2.9 with tachycardia following several days of menorrhagia. - She reports this has been chronic - She received 3 U pRBCs with hgb improving from 2.9 to 7.7 this morning - Given her menorrhagia with severe, life threatening anemia have consulted gyn and appreciate their assistance - Gynecology has seen patient and will need outpatient follow up however patient is not agreeable to this currently. Needs psychiatric evaluation - Megace added for ongoing bleeding - Repeat hgb in AM  #Schizoaffective/Bipolar - Paranoid with increasing agitation - Psych consult 6/6 given stable medical condition  #N/V - Resolved.   #Chronic anemia #B12 deficiency - Likely related to poor oral intake, nutritional deficiencies - Attempted to start IM B12 x 3 days however patient refuses. Agreeable to oral B12 replacement    DVT prophylaxis: SCDs, No chemical prophylaxis due to acute blood loss anemia  Code Status: Full Family Communication: None Disposition Plan: Anticipate discharge to psychiatric facility when medically cleared   Consultants:   Gyn, Dr. Kennon Rounds    Subjective: Patient is agitated this morning and does not allow staff to discuss management plans or options. She continues to  complain of her eyes clicking and her not having control over them. She did tolerate her breakfast this morning.   Objective: Vitals:   02/01/20 0939 02/01/20 1306 02/01/20 2052 02/02/20 0537  BP: 113/70 109/65 127/77 (!) 103/50  Pulse: 75 74 78 69    Resp: 20 17 18 18   Temp: 99.1 F (37.3 C) 98.6 F (37 C) 98.6 F (37 C) 98.3 F (36.8 C)  TempSrc: Oral Oral Oral Oral  SpO2: 100% 100% 100% 100%  Weight:      Height:        Intake/Output Summary (Last 24 hours) at 02/02/2020 1140 Last data filed at 02/02/2020 0900 Gross per 24 hour  Intake 1405.07 ml  Output --  Net 1405.07 ml   Filed Weights   01/31/20 0440  Weight: 54.4 kg    Examination:  General exam: Agitated Respiratory system: Clear to auscultation. Respiratory effort normal. Cardiovascular system: S1 & S2 heard, RRR. No JVD, murmurs, rubs, gallops or clicks. No pedal edema. Gastrointestinal system: Abdomen is nondistended, soft and nontender. No organomegaly or masses felt. Normal bowel sounds heard. Central nervous system: Alert and oriented. No focal neurological deficits. Extremities: Symmetric 5 x 5 power. Skin: No rashes, lesions or ulcers Psychiatry: Confabulation, paranoid with labile mood    Data Reviewed: I have personally reviewed following labs and imaging studies  CBC: Recent Labs  Lab 01/31/20 0506 02/01/20 0404 02/01/20 1211 02/02/20 0418  WBC 6.1 6.2 5.9 5.3  NEUTROABS 3.9  --   --   --   HGB 2.9* 7.7* 8.2* 7.9*  HCT 12.0* 26.0* 27.2* 26.4*  MCV 63.5* 78.1* 78.4* 78.1*  PLT 243 234 261 017   Basic Metabolic Panel: Recent Labs  Lab 01/31/20 0506 02/01/20 0404  NA 138 136  K 3.9 3.7  CL 104 103  CO2 23 26  GLUCOSE 113* 102*  BUN 21* 13  CREATININE 1.03* 0.74  CALCIUM 9.0 8.4*   GFR: Estimated Creatinine Clearance: 76.3 mL/min (by C-G formula based on SCr of 0.74 mg/dL). Liver Function Tests: Recent Labs  Lab 01/31/20 0506  AST 11*  ALT 7  ALKPHOS 46  BILITOT 0.9  PROT 7.8  ALBUMIN 4.2   No results for input(s): LIPASE, AMYLASE in the last 168 hours. No results for input(s): AMMONIA in the last 168 hours. Coagulation Profile: No results for input(s): INR, PROTIME in the last 168 hours. Cardiac Enzymes: No results  for input(s): CKTOTAL, CKMB, CKMBINDEX, TROPONINI in the last 168 hours. BNP (last 3 results) No results for input(s): PROBNP in the last 8760 hours. HbA1C: No results for input(s): HGBA1C in the last 72 hours. CBG: No results for input(s): GLUCAP in the last 168 hours. Lipid Profile: No results for input(s): CHOL, HDL, LDLCALC, TRIG, CHOLHDL, LDLDIRECT in the last 72 hours. Thyroid Function Tests: No results for input(s): TSH, T4TOTAL, FREET4, T3FREE, THYROIDAB in the last 72 hours. Anemia Panel: Recent Labs    01/31/20 0506  VITAMINB12 149*  FOLATE 15.8  FERRITIN 1*  TIBC 490*  IRON 12*  RETICCTPCT 1.2   Urine analysis:    Component Value Date/Time   COLORURINE YELLOW 01/31/2020 1027   APPEARANCEUR CLEAR 01/31/2020 1027   LABSPEC 1.018 01/31/2020 1027   PHURINE 5.0 01/31/2020 1027   GLUCOSEU NEGATIVE 01/31/2020 1027   HGBUR SMALL (A) 01/31/2020 1027   BILIRUBINUR NEGATIVE 01/31/2020 1027   KETONESUR 5 (A) 01/31/2020 1027   PROTEINUR NEGATIVE 01/31/2020 1027   UROBILINOGEN 0.2 12/06/2011 0034  NITRITE NEGATIVE 01/31/2020 1027   LEUKOCYTESUR NEGATIVE 01/31/2020 1027   Sepsis Labs: @LABRCNTIP (procalcitonin:4,lacticidven:4)  ) Recent Results (from the past 240 hour(s))  SARS Coronavirus 2 by RT PCR (hospital order, performed in [facility] hospital lab) Nasopharyngeal Nasopharyngeal Swab     Status: None   Collection Time: 01/31/20  5:06 AM   Specimen: Nasopharyngeal Swab  Result Value Ref Range Status   SARS Coronavirus 2 NEGATIVE NEGATIVE Final    Comment: (NOTE) SARS-CoV-2 target nucleic acids are NOT DETECTED. The SARS-CoV-2 RNA is generally detectable in upper and lower respiratory specimens during the acute phase of infection. The lowest concentration of SARS-CoV-2 viral copies this assay can detect is 250 copies / mL. A negative result does not preclude SARS-CoV-2 infection and should not be used as the sole basis for treatment or other patient management  decisions.  A negative result may occur with improper specimen collection / handling, submission of specimen other than nasopharyngeal swab, presence of viral mutation(s) within the areas targeted by this assay, and inadequate number of viral copies (<250 copies / mL). A negative result must be combined with clinical observations, patient history, and epidemiological information. Fact Sheet for Patients:   StrictlyIdeas.no Fact Sheet for Healthcare Providers: BankingDealers.co.za This test is not yet approved or cleared  by the [place] FDA and has been authorized for detection and/or diagnosis of SARS-CoV-2 by FDA under an Emergency Use Authorization (EUA).  This EUA will remain in effect (meaning this test can be used) for the duration of the COVID-19 declaration under Section 564(b)(1) of the Act, 21 U.S.C. section 360bbb-3(b)(1), unless the authorization is terminated or revoked sooner. Performed at [facility], [place] [address], [place], [place] [address]          Radiology Studies: US PELVIC COMPLETE WITH TRANSVAGINAL  Result Date: 02/01/2020 CLINICAL DATA:  Patient with menorrhagia. EXAM: TRANSABDOMINAL AND TRANSVAGINAL ULTRASOUND OF PELVIS TECHNIQUE: Both transabdominal and transvaginal ultrasound examinations of the pelvis were performed. Transabdominal technique was performed for global imaging of the pelvis including uterus, ovaries, adnexal regions, and pelvic cul-de-sac. It was necessary to proceed with endovaginal exam following the transabdominal exam to visualize the endometrium. COMPARISON:  None FINDINGS: Uterus Measurements: 13.6 x 9.2 x 12.8 cm = volume: 838.1 mL. Multiple fibroids are demonstrated. There is a 6.2 x 7.3 x 7.0 cm heterogeneous fibroid within the lower left uterine segment. There is a 4.1 x 4.6 x 4.0 cm fibroid within the posterior uterine body. There is a 3.5 x 3.0 x 2.7 cm fibroid  within the anterior uterine body. Multiple additional uterine fibroids are demonstrated. Endometrium Not well evaluated secondary to multiple uterine fibroids. Right ovary Not visualized. Left ovary Not visualized. Other findings No abnormal free fluid. IMPRESSION: Multiple uterine fibroids are demonstrated. The endometrium is not able to be assessed or well visualized secondary to multiple uterine fibroids. Electronically Signed   By: Lovey Newcomer M.D.   On: 02/01/2020 15:55        Scheduled Meds: . folic acid  1 mg Oral Daily  . megestrol  40 mg Oral BID  . multivitamin with minerals  1 tablet Oral Daily  . thiamine  100 mg Oral Daily  . vitamin B-12  1,000 mcg Oral Daily   Continuous Infusions: . sodium chloride    . lactated ringers 75 mL/hr at 02/02/20 0238     LOS: 2 days      Arlan Organ, DO  If 7PM-7AM, please contact night-coverage  [email or web address]  02/02/2020, 11:40 AM

## 2020-02-02 NOTE — Consult Note (Signed)
[facility] Face-to-Face Psychiatry Consult   Reason for Consult:  Paranoia Referring Physician:  Dr Gloriann Loan Patient Identification: Valerie Howard MRN:  [id number] Principal Diagnosis: Acute blood loss anemia Diagnosis:  Active Problems:   Anemia, iron deficiency   Schizoaffective disorder, bipolar type ([place])   Menorrhagia   Acute blood loss anemia  Total Time spent with patient: 1 hour  Subjective:   Valerie Howard is a 44 y.o. female patient admitted with anemia/blood loss, consult for psychosis.  When this provider entered the room and introduced herself, patient responded, "I do not want to talk to you.  I know who you are."  Patient's wishes were respected and asked this provider left the room she called after her with the word, "bitch."  She is well-known to our system and this provider for similar presentations, no insight into her mental illness and typically stops using her medications.  When she becomes unstable, she is brought to the ED.  Her medications were restarted based on her last admission at [place], see treatment plan below.  Recommend psychiatric inpatient hospitalization after she is medically cleared.  HPI per TTS:  Valerie Howard is an 44 y.o. female, who presents voluntary and accompanied by her daughters' Rick Duff and [place]) to [place]. Clinician asked the pt, "what brought you to the hospital?" Pt reported, she does not want mental health assistance. Pt wanted clinician to speak to daughters then to her. Per daughter, the pt is not bathing, she is living in a car on their property (homeless), spilled hot water on her leg, breaks things in their house, pt said (a while ago) she was going to put a gun to her head, and does not feel the pt will be safe outside of [place]. Pt denies, SI, HI, AVH, self-injurious behaviors and access to weapons. Pt reported, not bathing in two months nor eating in 72 hours. Pt was given food while at [facility]. Pt reported, she does not  like [place] and said she would blow her brains out then live in [place]. Per daughter, pt is allowed to used their bathroom and eat but does not like to during the time they allot. During the assessment pt asked clinician to call [facility].   Pt expressed not needing medications nor wanting medications. Per chart, pt has previous inpatient admissions.  Pt presents alert, irritable, disheveled (pt has a shirt wrapped around her waist while sitting on a blanket) with irritable speech. Pt reported, she is on her period. Pt's mood, affect is irritable. Pt's thought process was circumstantial. Pt was oriented x4. Pt's concentration, insight and impulse control was fair.   Past Psychiatric History:  Schizoaffective disorder, bipolar type  Risk to Self:  yes Risk to Others:  potential Prior Inpatient Therapy:  multiple times Prior Outpatient Therapy:  none currently  Past Medical History:  Past Medical History:  Diagnosis Date  . History of radiation therapy 07/18/17-07/24/17   right ear, operative bed 12 Gy in 3 fractions  . Keloid    right ear  . Paranoid behavior ([place])   . Schizophrenia [facility])     Past Surgical History:  Procedure Laterality Date  . CESAREAN SECTION    . KENALOG INJECTION Bilateral 07/17/2017   Procedure: KENALOG INJECTION;  Surgeon: Wallace Going, DO;  Location: [place];  Service: Plastics;  Laterality: Bilateral;  . MASS EXCISION N/A 07/17/2017   Procedure: EXCISION KELOID OF RIGHT EAR WITH  INTROPERATIVE KENALOG INJECTION AND POST OP RADIATION;  Surgeon: Wallace Going, DO;  Location: [place];  Service: Plastics;  Laterality: N/A;  . TUBAL LIGATION     Family History:  Family History  Problem Relation Age of Onset  . Hypertension Other   . Diabetes Other   . Cancer Other   . Keloids Mother   . Breast cancer Maternal Grandmother    Family Psychiatric  History: none Social History:   Social History   Substance and Sexual Activity  Alcohol Use Yes   Comment: socially     Social History   Substance and Sexual Activity  Drug Use No    Social History   Socioeconomic History  . Marital status: Divorced    Spouse name: Not on file  . Number of children: 4  . Years of education: Not on file  . Highest education level: Not on file  Occupational History  . Occupation: unemployed  Tobacco Use  . Smoking status: Never Smoker  . Smokeless tobacco: Never Used  Substance and Sexual Activity  . Alcohol use: Yes    Comment: socially  . Drug use: No  . Sexual activity: Not Currently    Birth control/protection: Surgical  Other Topics Concern  . Not on file  Social History Narrative  . Not on file   Social Determinants of Health   Financial Resource Strain:   . Difficulty of Paying Living Expenses:   Food Insecurity:   . Worried About Charity fundraiser in the Last Year:   . Arboriculturist in the Last Year:   Transportation Needs:   . Film/video editor (Medical):   [name] Lack of Transportation (Non-Medical):   Physical Activity:   . Days of Exercise per Week:   . Minutes of Exercise per Session:   Stress:   . Feeling of Stress :   Social Connections:   . Frequency of Communication with Friends and Family:   . Frequency of Social Gatherings with Friends and Family:   . Attends Religious Services:   . Active Member of Clubs or Organizations:   . Attends Archivist Meetings:   [name] Marital Status:    Additional Social History:    Allergies:   Allergies  Allergen Reactions  . Penicillins Hives and Itching    Has patient had a PCN reaction causing immediate rash, facial/tongue/throat swelling, SOB or lightheadedness with hypotension:Patient refuses to answer (PRA) Has patient had a PCN reaction causing severe rash involving mucus membranes or skin necrosis:PRA Has patient had a PCN reaction that required hospitalization PRA Has patient had  a PCN reaction occurring within the last 10 years:PRA If all of the above answers are "NO", then may proceed with Cephalosporin use.     Labs:  Results for orders placed or performed during the hospital encounter of 01/31/20 (from the past 48 hour(s))  Basic metabolic panel     Status: Abnormal   Collection Time: 02/01/20  4:04 AM  Result Value Ref Range   Sodium 136 135 - 145 mmol/L   Potassium 3.7 3.5 - 5.1 mmol/L   Chloride 103 98 - 111 mmol/L   CO2 26 22 - 32 mmol/L   Glucose, Bld 102 (H) 70 - 99 mg/dL    Comment: Glucose reference range applies only to samples taken after fasting for at least 8 hours.   BUN 13 6 - 20 mg/dL   Creatinine, Ser 0.74 0.44 - 1.00 mg/dL  Calcium 8.4 (L) 8.9 - 10.3 mg/dL   GFR calc non Af Amer >60 >60 mL/min   GFR calc Af Amer >60 >60 mL/min   Anion gap 7 5 - 15    Comment: Performed at [facility], [place] [address] [place], [place] [address]  CBC     Status: Abnormal   Collection Time: 02/01/20  4:04 AM  Result Value Ref Range   WBC 6.2 4.0 - 10.5 K/uL   RBC 3.33 (L) 3.87 - 5.11 MIL/uL   Hemoglobin 7.7 (L) 12.0 - 15.0 g/dL   HCT 26.0 (L) 36.0 - 46.0 %   MCV 78.1 (L) 80.0 - 100.0 fL   MCH 23.1 (L) 26.0 - 34.0 pg   MCHC 29.6 (L) 30.0 - 36.0 g/dL   RDW 28.7 (H) 11.5 - 15.5 %   Platelets 234 150 - 400 K/uL   nRBC 0.3 (H) 0.0 - 0.2 %    Comment: Performed at [facility], [place] [address] [place], [place] [address]  CBC     Status: Abnormal   Collection Time: 02/01/20 12:11 PM  Result Value Ref Range   WBC 5.9 4.0 - 10.5 K/uL   RBC 3.47 (L) 3.87 - 5.11 MIL/uL   Hemoglobin 8.2 (L) 12.0 - 15.0 g/dL   HCT 27.2 (L) 36.0 - 46.0 %   MCV 78.4 (L) 80.0 - 100.0 fL   MCH 23.6 (L) 26.0 - 34.0 pg   MCHC 30.1 30.0 - 36.0 g/dL   RDW 28.6 (H) 11.5 - 15.5 %   Platelets 261 150 - 400 K/uL   nRBC 0.5 (H) 0.0 - 0.2 %    Comment: Performed at [facility], [place] [address] [place], [place] [address]  CBC      Status: Abnormal   Collection Time: 02/02/20  4:18 AM  Result Value Ref Range   WBC 5.3 4.0 - 10.5 K/uL   RBC 3.38 (L) 3.87 - 5.11 MIL/uL   Hemoglobin 7.9 (L) 12.0 - 15.0 g/dL   HCT 26.4 (L) 36.0 - 46.0 %   MCV 78.1 (L) 80.0 - 100.0 fL   MCH 23.4 (L) 26.0 - 34.0 pg   MCHC 29.9 (L) 30.0 - 36.0 g/dL   RDW 28.8 (H) 11.5 - 15.5 %   Platelets 293 150 - 400 K/uL   nRBC 0.6 (H) 0.0 - 0.2 %    Comment: Performed at [facility], [place] [address], [place], [place] [address]    Current Facility-Administered Medications  Medication Dose Route Frequency Provider Last Rate Last Admin  . 0.9 %  sodium chloride infusion  10 mL/hr Intravenous Once Muthersbaugh, Jarrett Soho, PA-C      . acetaminophen (TYLENOL) suppository 650 mg  650 mg Rectal Q4H PRN Mansy, Jan A, MD      . acetaminophen (TYLENOL) tablet 650 mg  650 mg Oral Q4H PRN Mansy, Jan A, MD   650 mg at 02/01/20 0415  . folic acid (FOLVITE) tablet 1 mg  1 mg Oral Daily Little Ishikawa, MD   1 mg at 02/02/20 0929  . lactated ringers infusion   Intravenous Continuous Arlan Organ, DO 75 mL/hr at 02/02/20 0238 New Bag at 02/02/20 0238  . megestrol (MEGACE) tablet 40 mg  40 mg Oral BID Donnamae Jude, MD   40 mg at 02/02/20 0930  . morphine 2 MG/ML injection 2 mg  2 mg Intravenous Q4H PRN Mansy, Arvella Merles, MD      .  multivitamin with minerals tablet 1 tablet  1 tablet Oral Daily Little Ishikawa, MD   1 tablet at 02/02/20 0930  . ondansetron (ZOFRAN) injection 4 mg  4 mg Intravenous Q4H PRN Mansy, Jan A, MD      . thiamine tablet 100 mg  100 mg Oral Daily Little Ishikawa, MD   100 mg at 02/02/20 0930  . vitamin B-12 (CYANOCOBALAMIN) tablet 1,000 mcg  1,000 mcg Oral Daily Arlan Organ, DO        Musculoskeletal: Strength & Muscle Tone: within normal limits Gait & Station: did not witness Patient leans: N/A  Psychiatric Specialty Exam: Physical Exam  Nursing note and vitals reviewed. Constitutional: She is  oriented to person, place, and time. She appears well-developed and well-nourished.  HENT:  Head: Normocephalic.  Respiratory: Effort normal.  Musculoskeletal:        General: Normal range of motion.     Cervical back: Normal range of motion.  Neurological: She is alert and oriented to person, place, and time.  Psychiatric: Her speech is normal and behavior is normal. Her affect is angry and blunt. Thought content is paranoid. Cognition and memory are impaired. She expresses inappropriate judgment.    Review of Systems  Psychiatric/Behavioral: Positive for behavioral problems.  All other systems reviewed and are negative.   Blood pressure 117/68, pulse 76, temperature 99.3 F (37.4 C), temperature source Oral, resp. rate 13, height 5\' 7"  (1.702 m), weight 54.4 kg, last menstrual period 01/28/2020, SpO2 100 %.Body mass index is 18.79 kg/m.  General Appearance: Casual  Eye Contact:  stares  Speech:  Normal Rate  Volume:  Normal  Mood:  Angry and Irritable  Affect:  Blunt  Thought Process:  Coherent and Descriptions of Associations: Intact  Orientation:  Full (Time, Place, and Person)  Thought Content:  Paranoid Ideation  Suicidal Thoughts:  Has threatened recently to shoot herself  Homicidal Thoughts:  No  Memory: Unable to assess, patient refused to talk much, did recognize this practitioner being in behavioral health  Judgement:  Impaired  Insight:  Lacking  Psychomotor Activity:  Normal  Concentration:  Concentration: Poor and Attention Span: Poor  Recall:  [place] of Knowledge:  Fair  Language:  Good  Akathisia:  No  Handed:  Right  AIMS (if indicated):     Assets:  Leisure Time Resilience Social Support  ADL's:  Intact  Cognition:  None  Sleep:       Treatment Plan Summary: Schizoaffective disorder, bipolar type: -Haldol 10 mg BID oral or IM 5 mg BID, haldol dec recommended after tolerating oral -Admit to inpatient psychiatrist hospitalization  EPS: -Start  Cogentin 1 mg daily  Disposition: Recommend psychiatric Inpatient admission when medically cleared.  Waylan Boga, NP 02/02/2020 2:13 PM

## 2020-02-02 NOTE — Progress Notes (Signed)
Pt refused to take the first dose of Vit B12, Cogentin and Haldol after expalining that these are the new medicines ordered by MD. Pt stated loudly that she don't need these medicines. "I'm feeling ok, don't come back to my room again, bye."

## 2020-02-03 LAB — CBC
HCT: 25.6 % — ABNORMAL LOW (ref 36.0–46.0)
Hemoglobin: 7.4 g/dL — ABNORMAL LOW (ref 12.0–15.0)
MCH: 23.1 pg — ABNORMAL LOW (ref 26.0–34.0)
MCHC: 28.9 g/dL — ABNORMAL LOW (ref 30.0–36.0)
MCV: 80 fL (ref 80.0–100.0)
Platelets: 396 10*3/uL (ref 150–400)
RBC: 3.2 MIL/uL — ABNORMAL LOW (ref 3.87–5.11)
RDW: 29.3 % — ABNORMAL HIGH (ref 11.5–15.5)
WBC: 6.7 10*3/uL (ref 4.0–10.5)
nRBC: 0 % (ref 0.0–0.2)

## 2020-02-03 NOTE — Progress Notes (Signed)
Pt refusing assessment and medications at this time. Patient wanting to get up in the shower. RN explained that patients IV would have to be wrapped and door left open d/t patients IVC status. Patient states "don't even worry about it, I will do it another time. Just leave me alone." Will continue to monitor.

## 2020-02-03 NOTE — Progress Notes (Signed)
PROGRESS NOTE    VANIYAH LANSKY  TGY:[id number] DOB: [birth year] DOA: 01/31/2020 PCP: Patient, No Pcp Per     Brief Narrative:  Lorenda Hatchet is a 44 y.o. 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  Patient presented to the ED with behavioral health disturbances, apparently living in her car acting paranoid, appears not to be taking medications or following with treatment as previously outlined. She indicated that she believes her husband is "forcing her to apply for disability and that someone is poisoning her food" Apparently she has has difficulty sleeping has not bathed, does not perform ADLs and has had very poor p.o. intake over the past few days.  Family also indicated that patient has had large-volume menses for at least 4 days now with large clots but this is a chronic condition for her over at least the last decade if not longer.  Hemoglobin previously has been noted low in the 4-5 range.   In the ED patient's hemoglobin noted to be markedly low at 2.9 with remarkably low MCV at 63.5.  Creatinine also elevated at 1.03 consistent with history of poor p.o. intake.  Behavioral health involved in the ED.  6/6 - Patient seen by gynecology. Chronic anemia/ acute blood loss anemia secondary to fibroids. Patient has been agitated all day and has not allowed staff, including Dr. Kennon Rounds, gynecology, to explain her disease process and/or options for treatment. Psychiatry has been consulted now that she is medically stable with stable hemoglobin.  6/7-patient refusing examination.  She is also refusing labs and medications as recommended by gynecology.  She refused to talk to me and asked me to leave the room.  Discussed with psychiatry team.  They are recommending social work consult for the patient to be placed in inpatient psychiatric facility.  Assessment & Plan:   Active Problems:   Anemia, iron deficiency    Schizoaffective disorder, bipolar type (HCC)   Menorrhagia   Acute blood loss anemia   Paranoia (psychosis) (HCC)   #Acute blood loss anemia/menorrhagia/symptomatic anemia - Patient presented with hgb of 2.9 with tachycardia following several days of menorrhagia. - Chronic - She received 3 U pRBCs with hgb improving from 2.9 to 7.4 this morning - Given her severe menorrhagia, life threatening anemia have consulted gyn and appreciate their assistance - Gynecology has seen patient and will need outpatient follow up however patient is not agreeable to this currently.  - Megace added for ongoing bleeding.  However, patient refusing to take the Megace. -Hemoglobin this morning 7.4.  She has been refusing labs but the nurses were able to convince her.  Will repeat hgb in AM  #Schizoaffective/Bipolar - Paranoid with increasing agitation - Psych consult 6/6 given stable medical condition -Patient remains uncooperative and refusing medications.  Discussed with psychiatry.  They are recommending social work consult to evaluate the patient for inpatient psychiatric facility.  #N/V - Resolved.   #Chronic anemia #B12 deficiency - Likely related to poor oral intake, nutritional deficiencies - Attempted to start IM B12 x 3 days however patient refuses.  Oral B12 replacement ordered.  However, per the nursing staff patient has been refusing to take medication.   DVT prophylaxis: SCDs, No chemical prophylaxis due to acute blood loss anemia  Code Status: Full Family Communication: No family at bedside Disposition Plan: Anticipate discharge to psychiatric facility when available   Consultants:   Gyn, Dr. Kennon Rounds    Subjective: Patient is agitated  this morning.  She refused to talk to me.  She wanted me to leave the room.  Per nursing staff she has been refusing medications.  Objective: Vitals:   02/02/20 0537 02/02/20 1327 02/02/20 2206 02/03/20 1333  BP: (!) 103/50 117/68 110/62 118/61    Pulse: 69 76 66 84  Resp: 18 13 18 13   Temp: 98.3 F (36.8 C) 99.3 F (37.4 C) 98.4 F (36.9 C) 98.6 F (37 C)  TempSrc: Oral Oral Oral Oral  SpO2: 100% 100% 100% 100%  Weight:      Height:        Intake/Output Summary (Last 24 hours) at 02/03/2020 1359 Last data filed at 02/03/2020 0600 Gross per 24 hour  Intake 828.88 ml  Output --  Net 828.88 ml   Filed Weights   01/31/20 0440  Weight: 54.4 kg    Examination:  General exam: Agitated Respiratory system: Refusing to be examined, patient uncooperative Cardiovascular system: Unable to examine Gastrointestinal system: Unable to examine Central nervous system: Alert and oriented.  She is refusing to be examined Extremities: Symmetric, refusing to be examined. Psychiatry: Agitated, paranoid with labile mood    Data Reviewed: I have personally reviewed following labs and imaging studies  CBC: Recent Labs  Lab 01/31/20 0506 02/01/20 0404 02/01/20 1211 02/02/20 0418 02/03/20 0726  WBC 6.1 6.2 5.9 5.3 6.7  NEUTROABS 3.9  --   --   --   --   HGB 2.9* 7.7* 8.2* 7.9* 7.4*  HCT 12.0* 26.0* 27.2* 26.4* 25.6*  MCV 63.5* 78.1* 78.4* 78.1* 80.0  PLT 243 234 261 293 627   Basic Metabolic Panel: Recent Labs  Lab 01/31/20 0506 02/01/20 0404  NA 138 136  K 3.9 3.7  CL 104 103  CO2 23 26  GLUCOSE 113* 102*  BUN 21* 13  CREATININE 1.03* 0.74  CALCIUM 9.0 8.4*   GFR: Estimated Creatinine Clearance: 76.3 mL/min (by C-G formula based on SCr of 0.74 mg/dL). Liver Function Tests: Recent Labs  Lab 01/31/20 0506  AST 11*  ALT 7  ALKPHOS 46  BILITOT 0.9  PROT 7.8  ALBUMIN 4.2   No results for input(s): LIPASE, AMYLASE in the last 168 hours. No results for input(s): AMMONIA in the last 168 hours. Coagulation Profile: No results for input(s): INR, PROTIME in the last 168 hours. Cardiac Enzymes: No results for input(s): CKTOTAL, CKMB, CKMBINDEX, TROPONINI in the last 168 hours. BNP (last 3 results) No results for  input(s): PROBNP in the last 8760 hours. HbA1C: No results for input(s): HGBA1C in the last 72 hours. CBG: No results for input(s): GLUCAP in the last 168 hours. Lipid Profile: No results for input(s): CHOL, HDL, LDLCALC, TRIG, CHOLHDL, LDLDIRECT in the last 72 hours. Thyroid Function Tests: No results for input(s): TSH, T4TOTAL, FREET4, T3FREE, THYROIDAB in the last 72 hours. Anemia Panel: No results for input(s): VITAMINB12, FOLATE, FERRITIN, TIBC, IRON, RETICCTPCT in the last 72 hours. Urine analysis:    Component Value Date/Time   COLORURINE YELLOW 01/31/2020 1027   APPEARANCEUR CLEAR 01/31/2020 1027   LABSPEC 1.018 01/31/2020 1027   PHURINE 5.0 01/31/2020 1027   GLUCOSEU NEGATIVE 01/31/2020 1027   HGBUR SMALL (A) 01/31/2020 1027   BILIRUBINUR NEGATIVE 01/31/2020 1027   KETONESUR 5 (A) 01/31/2020 1027   PROTEINUR NEGATIVE 01/31/2020 1027   UROBILINOGEN 0.2 12/06/2011 0034   NITRITE NEGATIVE 01/31/2020 1027   LEUKOCYTESUR NEGATIVE 01/31/2020 1027   Sepsis Labs: @LABRCNTIP (procalcitonin:4,lacticidven:4)  ) Recent Results (from the past 240  hour(s))  SARS Coronavirus 2 by RT PCR (hospital order, performed in [facility] hospital lab) Nasopharyngeal Nasopharyngeal Swab     Status: None   Collection Time: 01/31/20  5:06 AM   Specimen: Nasopharyngeal Swab  Result Value Ref Range Status   SARS Coronavirus 2 NEGATIVE NEGATIVE Final    Comment: (NOTE) SARS-CoV-2 target nucleic acids are NOT DETECTED. The SARS-CoV-2 RNA is generally detectable in upper and lower respiratory specimens during the acute phase of infection. The lowest concentration of SARS-CoV-2 viral copies this assay can detect is 250 copies / mL. A negative result does not preclude SARS-CoV-2 infection and should not be used as the sole basis for treatment or other patient management decisions.  A negative result may occur with improper specimen collection / handling, submission of specimen other than  nasopharyngeal swab, presence of viral mutation(s) within the areas targeted by this assay, and inadequate number of viral copies (<250 copies / mL). A negative result must be combined with clinical observations, patient history, and epidemiological information. Fact Sheet for Patients:   StrictlyIdeas.no Fact Sheet for Healthcare Providers: BankingDealers.co.za This test is not yet approved or cleared  by the [place] FDA and has been authorized for detection and/or diagnosis of SARS-CoV-2 by FDA under an Emergency Use Authorization (EUA).  This EUA will remain in effect (meaning this test can be used) for the duration of the COVID-19 declaration under Section 564(b)(1) of the Act, 21 U.S.C. section 360bbb-3(b)(1), unless the authorization is terminated or revoked sooner. Performed at [facility], [place] [address] [place], [place] [address]          Radiology Studies: US PELVIC COMPLETE WITH TRANSVAGINAL  Result Date: 02/01/2020 CLINICAL DATA:  Patient with menorrhagia. EXAM: TRANSABDOMINAL AND TRANSVAGINAL ULTRASOUND OF PELVIS TECHNIQUE: Both transabdominal and transvaginal ultrasound examinations of the pelvis were performed. Transabdominal technique was performed for global imaging of the pelvis including uterus, ovaries, adnexal regions, and pelvic cul-de-sac. It was necessary to proceed with endovaginal exam following the transabdominal exam to visualize the endometrium. COMPARISON:  None FINDINGS: Uterus Measurements: 13.6 x 9.2 x 12.8 cm = volume: 838.1 mL. Multiple fibroids are demonstrated. There is a 6.2 x 7.3 x 7.0 cm heterogeneous fibroid within the lower left uterine segment. There is a 4.1 x 4.6 x 4.0 cm fibroid within the posterior uterine body. There is a 3.5 x 3.0 x 2.7 cm fibroid within the anterior uterine body. Multiple additional uterine fibroids are demonstrated. Endometrium Not well evaluated  secondary to multiple uterine fibroids. Right ovary Not visualized. Left ovary Not visualized. Other findings No abnormal free fluid. IMPRESSION: Multiple uterine fibroids are demonstrated. The endometrium is not able to be assessed or well visualized secondary to multiple uterine fibroids. Electronically Signed   By: Lovey Newcomer M.D.   On: 02/01/2020 15:55        Scheduled Meds:  benztropine  1 mg Oral Daily   folic acid  1 mg Oral Daily   haloperidol  5 mg Oral BID   Or   haloperidol lactate  5 mg Intramuscular BID   megestrol  40 mg Oral BID   multivitamin with minerals  1 tablet Oral Daily   thiamine  100 mg Oral Daily   vitamin B-12  1,000 mcg Oral Daily   Continuous Infusions:  sodium chloride     lactated ringers 75 mL/hr at 02/03/20 0443     LOS: 3 days      Yaakov Guthrie, MD Pager on  amion  If 7PM-7AM, please contact night-coverage [email or web address]  02/03/2020, 1:59 PM

## 2020-02-03 NOTE — Progress Notes (Signed)
Patient refusing for vitals signs to be taken.

## 2020-02-03 NOTE — TOC Progression Note (Signed)
Transition of Care [facility]) - Progression Note    Patient Details  Name: Valerie Howard MRN: [id number] Date of Birth: 1976-03-26  [facility] CM/SW Contact  Ross Ludwig, [place] Phone Number: 02/03/2020, 6:29 PM  Clinical Narrative:     CSW was informed that patient is a risk to herself, and physician would like to IVC patient.  Per psychiatry, patient needs inpatient behavioral health placement.  CSW completed IVC paperwork and faxed it to the magistrates office.  CSW received confirmation that it has been received, CSW contacted non emergency services and requested that patient be served.  CSW to work on behavioral health placement.        Expected Discharge Plan and Services                                                 Social Determinants of Health (SDOH) Interventions    Readmission Risk Interventions No flowsheet data found.

## 2020-02-03 NOTE — Progress Notes (Signed)
Pt only pick and chose  Multivitamins from the medicine cup to take. Refused to take all other medicines.

## 2020-02-03 NOTE — Progress Notes (Signed)
Patient refuses vs and blood drawn this morning.

## 2020-02-04 NOTE — Progress Notes (Signed)
Patient refusing vital signs and shift assessment. RN asked patient if she would take her night time medication, pt states "I don't need any medication, but you do. Now get out." Will continue to monitor.

## 2020-02-04 NOTE — Plan of Care (Signed)
Problem: Education: Goal: Knowledge of General Education information will improve Description: Including pain rating scale, medication(s)/side effects and non-pharmacologic comfort measures Outcome: Not Met (add Reason) Refusing care   Problem: Activity: Goal: Risk for activity intolerance will decrease Outcome: Not Met (add Reason)Refusing care   Problem: Nutrition: Goal: Adequate nutrition will be maintained Outcome: Not Met (add Reason)Refusing care   Problem: Elimination: Goal: Will not experience complications related to urinary retention Outcome: Not Met (add Reason)Refusing care   Problem: Pain Managment: Goal: General experience of comfort will improve Outcome: Not Met (add Reason)Refusing care   Problem: Safety: Goal: Ability to remain free from injury will improve Outcome: Not Met (add Reason)Refusing care   Problem: Skin Integrity: Goal: Risk for impaired skin integrity will decrease Outcome: Not Met (add Reason)Refusing care

## 2020-02-04 NOTE — Progress Notes (Signed)
Attempted to assess patient and provided medications. She proceeded to scream at myself and sitter stating she did not want to be messed with and wasn't taking medicine. MD and care team aware.

## 2020-02-04 NOTE — Progress Notes (Signed)
PROGRESS NOTE    Valerie Howard  XHB:[id number] DOB: 17-Apr-1976 DOA: 01/31/2020 PCP: Patient, No Pcp Per     Brief Narrative:  Valerie Howard is a 44 y.o. 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  Patient presented to the ED with behavioral health disturbances, apparently living in her car acting paranoid, appears not to be taking medications or following with treatment as previously outlined. She indicated that she believes her husband is "forcing her to apply for disability and that someone is poisoning her food" Apparently she has has difficulty sleeping has not bathed, does not perform ADLs and has had very poor p.o. intake over the past few days.  Family also indicated that patient has had large-volume menses for at least 4 days now with large clots but this is a chronic condition for her over at least the last decade if not longer.  Hemoglobin previously has been noted low in the 4-5 range.   In the ED patient's hemoglobin noted to be markedly low at 2.9 with remarkably low MCV at 63.5.  Creatinine also elevated at 1.03 consistent with history of poor p.o. intake.  Behavioral health involved in the ED.  6/6 - Patient seen by gynecology. Chronic anemia/ acute blood loss anemia secondary to fibroids. Patient has been agitated all day and has not allowed staff, including Dr. Kennon Rounds, gynecology, to explain her disease process and/or options for treatment. Psychiatry has been consulted now that she is medically stable with stable hemoglobin.   6/7-patient refusing examination.  She is also refusing labs and medications as recommended by gynecology.  She refused to talk to me and asked me to leave the room.  Discussed with psychiatry team.  They are recommending social work consult for the patient to be placed in inpatient psychiatric facility.  6/8-patient refusing to be examined, refused labs and  medications.  Assessment & Plan:   Active Problems:   Anemia, iron deficiency   Schizoaffective disorder, bipolar type (HCC)   Menorrhagia   Acute blood loss anemia   Paranoia (psychosis) (HCC)   #Acute blood loss anemia/menorrhagia/symptomatic anemia - Patient presented with hgb of 2.9 with tachycardia following several days of menorrhagia. - Chronic - She received 3 U pRBCs with hgb improving from 2.9 to 7.4 this morning - Given her severe menorrhagia, life threatening anemia have consulted gyn and appreciate their assistance - Gynecology has seen patient and will need outpatient follow up however patient is not agreeable to this currently.  - Megace added for ongoing bleeding.  However, patient refusing to take the Megace. -Hemoglobin yesterday 7.4.  She has been refusing labs.  #Schizoaffective/Bipolar - Paranoid with increasing agitation - Psych consult 6/6  -Patient remains uncooperative and refusing medications and labs.  Discussed with psychiatry.  They are recommending inpatient psychiatric facility.  Social work on [employer].  #N/V - Resolved.   #Chronic anemia #B12 deficiency - Likely related to poor oral intake, nutritional deficiencies - Attempted to start IM B12 x 3 days however patient refuses.  Oral B12 replacement ordered.  However, per the nursing staff patient has been refusing to take medication.   DVT prophylaxis: SCDs, No chemical prophylaxis due to acute blood loss anemia  Code Status: Full Family Communication: No family at bedside, unable to reach Disposition Plan: Anticipate discharge to psychiatric facility when available   Consultants:   Gyn, Dr. Kennon Rounds    Subjective: Per nursing staff she has been refusing medications.  She refused to talk to me this morning.  Objective: Vitals:   02/02/20 1327 02/02/20 2206 02/03/20 1333 02/04/20 1414  BP: 117/68 110/62 118/61 125/81  Pulse: 76 66 84 93  Resp: 13 18 13 19   Temp: 99.3 F (37.4 C) 98.4  F (36.9 C) 98.6 F (37 C) 98.4 F (36.9 C)  TempSrc: Oral Oral Oral Oral  SpO2: 100% 100% 100% 100%  Weight:      Height:        Intake/Output Summary (Last 24 hours) at 02/04/2020 1452 Last data filed at 02/04/2020 1200 Gross per 24 hour  Intake 710 ml  Output --  Net 710 ml   Filed Weights   01/31/20 0440  Weight: 54.4 kg    Examination:  General exam: She is getting very agitated when I tried to talk to her. Respiratory system: Refusing to be examined, patient uncooperative Cardiovascular system: Unable to examine Gastrointestinal system: Unable to examine Central nervous system: Alert and oriented. She is refusing to be examined Extremities: refusing to be examined. Psychiatry: Agitated, paranoid with labile mood    Data Reviewed: I have personally reviewed following labs and imaging studies  CBC: Recent Labs  Lab 01/31/20 0506 02/01/20 0404 02/01/20 1211 02/02/20 0418 02/03/20 0726  WBC 6.1 6.2 5.9 5.3 6.7  NEUTROABS 3.9  --   --   --   --   HGB 2.9* 7.7* 8.2* 7.9* 7.4*  HCT 12.0* 26.0* 27.2* 26.4* 25.6*  MCV 63.5* 78.1* 78.4* 78.1* 80.0  PLT 243 234 261 293 829   Basic Metabolic Panel: Recent Labs  Lab 01/31/20 0506 02/01/20 0404  NA 138 136  K 3.9 3.7  CL 104 103  CO2 23 26  GLUCOSE 113* 102*  BUN 21* 13  CREATININE 1.03* 0.74  CALCIUM 9.0 8.4*   GFR: Estimated Creatinine Clearance: 76.3 mL/min (by C-G formula based on SCr of 0.74 mg/dL). Liver Function Tests: Recent Labs  Lab 01/31/20 0506  AST 11*  ALT 7  ALKPHOS 46  BILITOT 0.9  PROT 7.8  ALBUMIN 4.2   No results for input(s): LIPASE, AMYLASE in the last 168 hours. No results for input(s): AMMONIA in the last 168 hours. Coagulation Profile: No results for input(s): INR, PROTIME in the last 168 hours. Cardiac Enzymes: No results for input(s): CKTOTAL, CKMB, CKMBINDEX, TROPONINI in the last 168 hours. BNP (last 3 results) No results for input(s): PROBNP in the last 8760  hours. HbA1C: No results for input(s): HGBA1C in the last 72 hours. CBG: No results for input(s): GLUCAP in the last 168 hours. Lipid Profile: No results for input(s): CHOL, HDL, LDLCALC, TRIG, CHOLHDL, LDLDIRECT in the last 72 hours. Thyroid Function Tests: No results for input(s): TSH, T4TOTAL, FREET4, T3FREE, THYROIDAB in the last 72 hours. Anemia Panel: No results for input(s): VITAMINB12, FOLATE, FERRITIN, TIBC, IRON, RETICCTPCT in the last 72 hours. Urine analysis:    Component Value Date/Time   COLORURINE YELLOW 01/31/2020 1027   APPEARANCEUR CLEAR 01/31/2020 1027   LABSPEC 1.018 01/31/2020 1027   PHURINE 5.0 01/31/2020 1027   GLUCOSEU NEGATIVE 01/31/2020 1027   HGBUR SMALL (A) 01/31/2020 1027   BILIRUBINUR NEGATIVE 01/31/2020 1027   KETONESUR 5 (A) 01/31/2020 1027   PROTEINUR NEGATIVE 01/31/2020 1027   UROBILINOGEN 0.2 12/06/2011 0034   NITRITE NEGATIVE 01/31/2020 1027   LEUKOCYTESUR NEGATIVE 01/31/2020 1027   Sepsis Labs: @LABRCNTIP (procalcitonin:4,lacticidven:4)  ) Recent Results (from the past 240 hour(s))  SARS Coronavirus 2 by RT PCR (hospital order, performed  in [place] lab) Nasopharyngeal Nasopharyngeal Swab     Status: None   Collection Time: 01/31/20  5:06 AM   Specimen: Nasopharyngeal Swab  Result Value Ref Range Status   SARS Coronavirus 2 NEGATIVE NEGATIVE Final    Comment: (NOTE) SARS-CoV-2 target nucleic acids are NOT DETECTED. The SARS-CoV-2 RNA is generally detectable in upper and lower respiratory specimens during the acute phase of infection. The lowest concentration of SARS-CoV-2 viral copies this assay can detect is 250 copies / mL. A negative result does not preclude SARS-CoV-2 infection and should not be used as the sole basis for treatment or other patient management decisions.  A negative result may occur with improper specimen collection / handling, submission of specimen other than nasopharyngeal swab, presence of viral  mutation(s) within the areas targeted by this assay, and inadequate number of viral copies (<250 copies / mL). A negative result must be combined with clinical observations, patient history, and epidemiological information. Fact Sheet for Patients:   StrictlyIdeas.no Fact Sheet for Healthcare Providers: BankingDealers.co.za This test is not yet approved or cleared  by the [place] FDA and has been authorized for detection and/or diagnosis of SARS-CoV-2 by FDA under an Emergency Use Authorization (EUA).  This EUA will remain in effect (meaning this test can be used) for the duration of the COVID-19 declaration under Section 564(b)(1) of the Act, 21 U.S.C. section 360bbb-3(b)(1), unless the authorization is terminated or revoked sooner. Performed at [facility], [place] [address], [place], [place] [address]          Radiology Studies: No results found.      Scheduled Meds: . benztropine  1 mg Oral Daily  . folic acid  1 mg Oral Daily  . haloperidol  5 mg Oral BID   Or  . haloperidol lactate  5 mg Intramuscular BID  . megestrol  40 mg Oral BID  . multivitamin with minerals  1 tablet Oral Daily  . thiamine  100 mg Oral Daily  . vitamin B-12  1,000 mcg Oral Daily   Continuous Infusions: . sodium chloride    . lactated ringers 75 mL/hr at 02/03/20 0443     LOS: 4 days      Yaakov Guthrie, MD Pager on amion  If 7PM-7AM, please contact night-coverage [email or web address]  02/04/2020, 2:52 PM

## 2020-02-04 NOTE — TOC Progression Note (Addendum)
Transition of Care [facility]) - Progression Note    Patient Details  Name: Valerie Howard MRN: [id number] Date of Birth: 04-07-1976  [facility] CM/SW Contact  Ross Ludwig, [place] Phone Number: 02/04/2020, 9:47 AM  Clinical Narrative:     CSW contacted [facility] to make a referral.  Spoke to staff at behavioral health, and they will make the referral to the [facility].  CSW awaiting for a call back, on decision and bed availability.    CSW spoke to [place] at [facility], they are unable to take her at [facility] and [facility] behavioral health.  CSW faxed referrals to [facility] in [place], [facility] in [place], [facility] in [place], [facility] in [place], and [facility] in [place].  CSW awaiting for decision back from behavioral Health facilities.      Expected Discharge Plan and Services  Inpatient behavioral psych.                          Social Determinants of Health (SDOH) Interventions    Readmission Risk Interventions No flowsheet data found.

## 2020-02-04 NOTE — Progress Notes (Signed)
Patient refusing morning labs as well as morning vital signs. Patient does not want to be bothered. Will continue to monitor.

## 2020-02-05 NOTE — Plan of Care (Signed)
  Problem: Nutrition: Goal: Adequate nutrition will be maintained Outcome: Progressing   Problem: Elimination: Goal: Will not experience complications related to bowel motility Outcome: Progressing Goal: Will not experience complications related to urinary retention Outcome: Progressing   

## 2020-02-05 NOTE — TOC Progression Note (Signed)
Transition of Care [facility]) - Progression Note    Patient Details  Name: Valerie Howard MRN: [id number] Date of Birth: [birth year]  Transition of Care [facility]) CM/SW Contact  Ross Ludwig, [place] Phone Number: 02/05/2020, 2:49 PM  Clinical Narrative:     CSW contacted, [facility], they do not have any beds available right now, CSW also contacted [employer] which used to be called [facility] [facility], CSW left a message on voice mail, also contacted [facility], and left a message for them to call back.  CSW also contacted [facility], they only accept adolescences and patients over 50.  CSW contacted [facility] and left a message. CSW continuing to look for placement.          Expected Discharge Plan and Services    Inpatient Psychiatric placement.                                               Social Determinants of Health (SDOH) Interventions    Readmission Risk Interventions No flowsheet data found.

## 2020-02-05 NOTE — Progress Notes (Signed)
Pt was calm on introduction at change of shift. She consented to letting me assess her. Saltines and a sandwich were given to pt per her request. Sitter currently in room with pt.

## 2020-02-05 NOTE — Progress Notes (Addendum)
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 She was apparently living in her car acting paranoid, appears not to be taking medications or following with treatment as previously outlined.She indicated that she believes her husband is "forcing her to apply for disability and that someone is poisoning her food" . Apparently she has has difficulty sleeping has not bathed, does not perform ADLs and has had very poor p.o. intake over  few days. Family also indicated that patient has had large-volume menses for at least 4 days  with large clots but this is a chronic condition for her over at least the last decade if not longer. Hemoglobin previously has been noted low in the 4-5 range. In the ED, patient's hemoglobin noted to be markedly low at 2.9 with remarkably low MCV at 63.5. Creatinine also elevated at 1.03 consistent with history of poor p.o. intake. Behavioral health involved in the ED. patient was subsequently seen by gynecology on 6/6 but patient has been refusing any treatment options and examination.  Psychiatry was also consulted who recommended inpatient psych placement.  Assessment/Plan:  Active Problems:   Anemia, iron deficiency   Schizoaffective disorder, bipolar type (HCC)   Menorrhagia   Acute blood loss anemia   Paranoia (psychosis) (HCC)   Acute blood loss anemia on chronic anemia likely secondary to menorrhagia Patient presented with hemoglobin of 2.9 with tachycardia and received 3 units of packed RBC with improvement of her hemoglobin to 7.4.  Patient has refused her treatment options and blood work.  Gynecology has seen patient and will need outpatient follow  up however patient is not agreeable to this currently.  Megace has been added but the patient has been refusing to take it.  Patient also has B12 deficiency.  Oral B12 has been ordered but refuses to take it.  Also  refused to IM B12.  Schizoaffective disorder/Bipolar disease Patient has paranoia and easily gets irritated.  Refusing treatment options.  Psychiatry has seen the patient on 02/02/2020 and recommended Haldol 10 mg twice daily or IM 5 mg twice daily.  Haldol Decanoate recommended refusing IM injections..  Cogentin 1 mg daily was also recommended.  Patient is uncooperative refusing medication.  Will need inpatient psychiatry placement.    VTE Prophylaxis: Sequential compression device  Code Status: Full  Family Communication: I spoke with the patient's daughter and updated her about the clinical condition of the patient.   Status is: Inpatient  Remains inpatient appropriate because:Unsafe d/c plan, will need inpatient psychiatry admission  Dispo: The patient is from: Home              Anticipated d/c is to: Inpatient psych facility, likely to be difficult disposition.              Anticipated d/c date is: Undetermined at this time.              Patient currently is medically stable to d/c.  Consultants:  Gynecology Dr. Kennon Rounds  Psychiatry  Procedures:  PRBC transfusion  Antibiotics:  . None  Anti-infectives (From admission, onward)   None     Subjective: Today, patient was seen and examined at bedside.  Patient feels paranoid and does not wish to talk to  me much.  Did not wish to answer her current condition.  Objective: Vitals:   02/03/20 1333 02/04/20 1414  BP: 118/61 125/81  Pulse: 84 93  Resp: 13 19  Temp:  98.4 F (36.9 C)  SpO2: 100% 100%    Intake/Output Summary (Last 24 hours) at 02/05/2020 0728 Last data filed at 02/04/2020 2315 Gross per 24 hour  Intake 2985 ml  Output --  Net 2985 ml   Filed Weights   01/31/20 0440  Weight: 54.4 kg   Body  mass index is 18.79 kg/m.   Physical Exam: GENERAL: Patient is alert awake and gets easily irritated, not in obvious distress.  Thinly built HENT: No scleral pallor or icterus. Pupils equally reactive to light. Oral mucosa is moist NECK: is supple, no gross swelling noted. CHEST: Clear to auscultation.  Diminished breath sounds bilaterally. CVS: S1 and S2 heard, no murmur. Regular rate and rhythm.  ABDOMEN: Soft, non-tender, bowel sounds are present. EXTREMITIES: No edema. CNS: Moving all extremities. SKIN: warm and dry without rashes.  Data Review: I have personally reviewed the following laboratory data and studies,  CBC: Recent Labs  Lab 01/31/20 0506 02/01/20 0404 02/01/20 1211 02/02/20 0418 02/03/20 0726  WBC 6.1 6.2 5.9 5.3 6.7  NEUTROABS 3.9  --   --   --   --   HGB 2.9* 7.7* 8.2* 7.9* 7.4*  HCT 12.0* 26.0* 27.2* 26.4* 25.6*  MCV 63.5* 78.1* 78.4* 78.1* 80.0  PLT 243 234 261 293 712   Basic Metabolic Panel: Recent Labs  Lab 01/31/20 0506 02/01/20 0404  NA 138 136  K 3.9 3.7  CL 104 103  CO2 23 26  GLUCOSE 113* 102*  BUN 21* 13  CREATININE 1.03* 0.74  CALCIUM 9.0 8.4*   Liver Function Tests: Recent Labs  Lab 01/31/20 0506  AST 11*  ALT 7  ALKPHOS 46  BILITOT 0.9  PROT 7.8  ALBUMIN 4.2   No results for input(s): LIPASE, AMYLASE in the last 168 hours. No results for input(s): AMMONIA in the last 168 hours. Cardiac Enzymes: No results for input(s): CKTOTAL, CKMB, CKMBINDEX, TROPONINI in the last 168 hours. BNP (last 3 results) No results for input(s): BNP in the last 8760 hours.  ProBNP (last 3 results) No results for input(s): PROBNP in the last 8760 hours.  CBG: No results for input(s): GLUCAP in the last 168 hours. Recent Results (from the past 240 hour(s))  SARS Coronavirus 2 by RT PCR (hospital order, performed in [facility] hospital lab) Nasopharyngeal Nasopharyngeal Swab     Status: None   Collection Time: 01/31/20  5:06 AM   Specimen:  Nasopharyngeal Swab  Result Value Ref Range Status   SARS Coronavirus 2 NEGATIVE NEGATIVE Final    Comment: (NOTE) SARS-CoV-2 target nucleic acids are NOT DETECTED. The SARS-CoV-2 RNA is generally detectable in upper and lower respiratory specimens during the acute phase of infection. The lowest concentration of SARS-CoV-2 viral copies this assay can detect is 250 copies / mL. A negative result does not preclude SARS-CoV-2 infection and should not be used as the sole basis for treatment or other patient management decisions.  A negative result may occur with improper specimen collection / handling, submission of specimen other than nasopharyngeal swab, presence of viral mutation(s) within the areas targeted by this assay, and inadequate number of viral copies (<250 copies / mL). A negative result must be combined with clinical observations, patient history, and epidemiological information. Fact Sheet for Patients:  StrictlyIdeas.no Fact Sheet for Healthcare Providers: BankingDealers.co.za This test is not yet approved or cleared  by the [place] FDA and has been authorized for detection and/or diagnosis of SARS-CoV-2 by FDA under an Emergency Use Authorization (EUA).  This EUA will remain in effect (meaning this test can be used) for the duration of the COVID-19 declaration under Section 564(b)(1) of the Act, 21 U.S.C. section 360bbb-3(b)(1), unless the authorization is terminated or revoked sooner. Performed at [facility] [place] [address] [place], [place] [address]      Studies: No results found.    Flora Lipps, MD  [facility] 02/05/2020

## 2020-02-05 NOTE — Progress Notes (Signed)
Patient refusing vital signs and shift assessment. RN asked patient if she wanted her scheduled medications but patient refused. Patient states that, "whatever they have been feeding me is causing me to loose all my hair on my arms and legs, I need to take my vitamin E and vitamin C." RN explained that she did not have those meds scheduled but she would notify the MD. Will continue to monitor.

## 2020-02-06 NOTE — Consult Note (Signed)
Client promptly stated to this provider when entering the room, "I told you I was not going to talk to you.  Get out?"  Then proceeded to curse and call names as this provider was leaving.  Forced medications are needed, requires 2 MDs to sign for this unless the client is an active threat to herself or others.  Haldol IM available for the forced meds, would recommend adding Benadryl 50 mg IM and Ativan 2 mg IM to the Haldol dose.  Continues to be threatening and agitated.  Stabilization will not occur without medications.  Waylan Boga, PMHNP

## 2020-02-06 NOTE — TOC Progression Note (Addendum)
Transition of Care [facility]) - Progression Note    Patient Details  Name: Valerie Howard MRN: [id number] Date of Birth: 05-02-1976  [facility] of [facility] CM/SW Contact  Valerie Howard, [place] Phone Number: 02/06/2020, 10:19 AM  Clinical Narrative:     CSW contacted [facility], and spoke to intake worker at facility.  They said they do have availability, and will review patient referral.  CSW sent updated clinical notes to facility.  Patient still under IVC, CSW continuing to work on placement options for her.  CSW received phone call from intake at [facility], she said that they feel patient is too medically acute because of the blood loss and the facility can not accept her until they know that she is medically stable, then they can review again.  [facility], stated they need to know if patient's blood levels are stable because they can not do transfusions there if she needs it.  CSW spoke to Valerie Howard at [facility] inpatient psych, and she said there are no beds available, but to fax referral to them to review, [phone].  CSW called Valerie Howard and left a message waiting for a phone call back regarding bed availability.       Expected Discharge Plan and [place] placement.                                             Social Determinants of Health (SDOH) Interventions    Readmission Risk Interventions No flowsheet data found.

## 2020-02-06 NOTE — Progress Notes (Signed)
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 She was apparently living in her car acting paranoid, appears not to be taking medications or following with treatment as previously outlined.She indicated that she believes her husband is "forcing her to apply for disability and that someone is poisoning her food" . Apparently she has has difficulty sleeping has not bathed, does not perform ADLs and has had very poor p.o. intake over  few days. Family also indicated that patient has had large-volume menses for at least 4 days  with large clots but this is a chronic condition for her over at least the last decade if not longer. Hemoglobin previously has been noted low in the 4-5 range. In the ED, patient's hemoglobin noted to be markedly low at 2.9 with remarkably low MCV at 63.5. Creatinine also elevated at 1.03 consistent with history of poor p.o. intake. Behavioral health involved in the ED. Patient was subsequently seen by gynecology on 6/6 but patient has been refusing any treatment options and examination.  Psychiatry was also consulted who recommended inpatient psych placement.  Assessment/Plan:  Active Problems:   Anemia, iron deficiency   Schizoaffective disorder, bipolar type (HCC)   Menorrhagia   Acute blood loss anemia   Paranoia (psychosis) (HCC)  Acute blood loss anemia on chronic anemia likely secondary to menorrhagia Patient presented with hemoglobin of 2.9 with tachycardia and received 3 units of packed RBC with improvement of her hemoglobin to 7.4 (on 02/03/20).  Patient has refused treatment options including blood work.  Gynecology has seen the patient but patient is  not agreeable to treatment plan.  Has been refusing oral medication IM medications as well.   Schizoaffective disorder/Bipolar disease Patient has paranoia. Refusing treatment options.  Psychiatry has seen the patient on 02/02/2020 and recommended Haldol 10 mg twice daily or IM 5 mg twice daily.  Haldol Decanoate recommended, refusing IM injections..  Cogentin 1 mg daily was also recommended.  Patient is uncooperative, refusing medication.  Transition of care requesting repeat psych assessment.  Re consult placed.  VTE Prophylaxis: Sequential compression device  Code Status: Full  Family Communication:  None today. I spoke with the patient's daughter and updated her about the clinical condition of the patient yesterday.   Status is: Inpatient  Remains inpatient appropriate because:Unsafe d/c plan, will need inpatient psychiatry admission  Dispo: The patient is from: Home              Anticipated d/c is to: Inpatient psych facility, likely to be a difficult disposition.              Anticipated d/c date is: Undetermined at this time.              Patient currently is medically stable to d/c.  Consultants:  Gynecology Dr. Kennon Rounds  Psychiatry  Procedures:  PRBC transfusion  Antibiotics:  . None  Anti-infectives (From admission, onward)   None     Subjective: Today, patient did not wish to talk to me in was upset that I came into her room.  Started cursing out heavily.  Objective: Vitals:   02/05/20 1316 02/05/20 2158  BP: 136/69 133/83  Pulse: 92 86  Resp: 19 17  Temp: 99  F (37.2 C) 98.7 F (37.1 C)  SpO2: 100% 100%    Intake/Output Summary (Last 24 hours) at 02/06/2020 0741 Last data filed at 02/06/2020 0600 Gross per 24 hour  Intake 2908.04 ml  Output --  Net 2908.04 ml   Filed Weights   01/31/20 0440  Weight: 54.4 kg   Body mass index is 18.79 kg/m.   Physical Exam: GENERALPatient is alert awake and gets easily irritated, not in obvious distress.   Thinly built Patient refused the examination completely   Data Review: I have personally reviewed the following laboratory data and studies,  CBC: Recent Labs  Lab 01/31/20 0506 02/01/20 0404 02/01/20 1211 02/02/20 0418 02/03/20 0726  WBC 6.1 6.2 5.9 5.3 6.7  NEUTROABS 3.9  --   --   --   --   HGB 2.9* 7.7* 8.2* 7.9* 7.4*  HCT 12.0* 26.0* 27.2* 26.4* 25.6*  MCV 63.5* 78.1* 78.4* 78.1* 80.0  PLT 243 234 261 293 902   Basic Metabolic Panel: Recent Labs  Lab 01/31/20 0506 02/01/20 0404  NA 138 136  K 3.9 3.7  CL 104 103  CO2 23 26  GLUCOSE 113* 102*  BUN 21* 13  CREATININE 1.03* 0.74  CALCIUM 9.0 8.4*   Liver Function Tests: Recent Labs  Lab 01/31/20 0506  AST 11*  ALT 7  ALKPHOS 46  BILITOT 0.9  PROT 7.8  ALBUMIN 4.2   No results for input(s): LIPASE, AMYLASE in the last 168 hours. No results for input(s): AMMONIA in the last 168 hours. Cardiac Enzymes: No results for input(s): CKTOTAL, CKMB, CKMBINDEX, TROPONINI in the last 168 hours. BNP (last 3 results) No results for input(s): BNP in the last 8760 hours.  ProBNP (last 3 results) No results for input(s): PROBNP in the last 8760 hours.  CBG: No results for input(s): GLUCAP in the last 168 hours. Recent Results (from the past 240 hour(s))  SARS Coronavirus 2 by RT PCR (hospital order, performed in [facility] hospital lab) Nasopharyngeal Nasopharyngeal Swab     Status: None   Collection Time: 01/31/20  5:06 AM   Specimen: Nasopharyngeal Swab  Result Value Ref Range Status   SARS Coronavirus 2 NEGATIVE NEGATIVE Final    Comment: (NOTE) SARS-CoV-2 target nucleic acids are NOT DETECTED. The SARS-CoV-2 RNA is generally detectable in upper and lower respiratory specimens during the acute phase of infection. The lowest concentration of SARS-CoV-2 viral copies this assay can detect is 250 copies / mL. A negative result does not preclude SARS-CoV-2 infection and should not be used as the sole basis for  treatment or other patient management decisions.  A negative result may occur with improper specimen collection / handling, submission of specimen other than nasopharyngeal swab, presence of viral mutation(s) within the areas targeted by this assay, and inadequate number of viral copies (<250 copies / mL). A negative result must be combined with clinical observations, patient history, and epidemiological information. Fact Sheet for Patients:   StrictlyIdeas.no Fact Sheet for Healthcare Providers: BankingDealers.co.za This test is not yet approved or cleared  by the [place] FDA and has been authorized for detection and/or diagnosis of SARS-CoV-2 by FDA under an Emergency Use Authorization (EUA).  This EUA will remain in effect (meaning this test can be used) for the duration of the COVID-19 declaration under Section 564(b)(1) of the Act, 21 U.S.C. section 360bbb-3(b)(1), unless the authorization is terminated or revoked sooner. Performed at [facility], [place] [address], [place], [place] [address]  Studies: No results found.  Flora Lipps, MD  [facility] 02/06/2020

## 2020-02-06 NOTE — Progress Notes (Signed)
Pt refusing to let RN do AM assessment this morning. Pt states "No you are not going to do that just leave me alone" Sitter remains in room and all safety measures are maintained. Pt is calm at this time lying in bed on side watching TV

## 2020-02-06 NOTE — Progress Notes (Signed)
Pt is refusing all PO Medications. Pt states "Get out I am not taking anything from any of you" Pt states " Do you all treat "N" people this way? MD aware

## 2020-02-07 LAB — COMPREHENSIVE METABOLIC PANEL
ALT: 10 U/L (ref 0–44)
AST: 13 U/L — ABNORMAL LOW (ref 15–41)
Albumin: 3.3 g/dL — ABNORMAL LOW (ref 3.5–5.0)
Alkaline Phosphatase: 40 U/L (ref 38–126)
Anion gap: 10 (ref 5–15)
BUN: 12 mg/dL (ref 6–20)
CO2: 25 mmol/L (ref 22–32)
Calcium: 8.9 mg/dL (ref 8.9–10.3)
Chloride: 105 mmol/L (ref 98–111)
Creatinine, Ser: 0.86 mg/dL (ref 0.44–1.00)
GFR calc Af Amer: >60 mL/min (ref >60)
GFR calc non Af Amer: >60 mL/min (ref >60)
Glucose, Bld: 100 mg/dL — ABNORMAL HIGH (ref 70–99)
Potassium: 4.6 mmol/L (ref 3.5–5.1)
Sodium: 140 mmol/L (ref 135–145)
Total Bilirubin: 0.5 mg/dL (ref 0.3–1.2)
Total Protein: 6.4 g/dL — ABNORMAL LOW (ref 6.5–8.1)

## 2020-02-07 LAB — CBC
HCT: 26.2 % — ABNORMAL LOW (ref 36.0–46.0)
Hemoglobin: 7.6 g/dL — ABNORMAL LOW (ref 12.0–15.0)
MCH: 22.8 pg — ABNORMAL LOW (ref 26.0–34.0)
MCHC: 29 g/dL — ABNORMAL LOW (ref 30.0–36.0)
MCV: 78.7 fL — ABNORMAL LOW (ref 80.0–100.0)
Platelets: 582 10*3/uL — ABNORMAL HIGH (ref 150–400)
RBC: 3.33 MIL/uL — ABNORMAL LOW (ref 3.87–5.11)
RDW: 29.6 % — ABNORMAL HIGH (ref 11.5–15.5)
WBC: 7.2 10*3/uL (ref 4.0–10.5)
nRBC: 0 % (ref 0.0–0.2)

## 2020-02-07 NOTE — Progress Notes (Signed)
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 She was apparently living in her car acting paranoid, appears not to be taking medications or following with treatment as previously outlined.She indicated that she believes her husband is "forcing her to apply for disability and that someone is poisoning her food" . Apparently she has has difficulty sleeping has not bathed, does not perform ADLs and has had very poor p.o. intake over  few days. Family also indicated that patient has had large-volume menses for at least 4 days  with large clots but this is a chronic condition for her over at least the last decade if not longer. Hemoglobin previously has been noted low in the 4-5 range. In the ED, patient's hemoglobin noted to be markedly low at 2.9 with remarkably low MCV at 63.5. Creatinine also elevated at 1.03 consistent with history of poor p.o. intake. Behavioral health involved in the ED. Patient was subsequently seen by gynecology on 6/6 but patient has been refusing any treatment options and examination.  Psychiatry was also consulted who recommended inpatient psych placement. Patient has been refusing vital checks blood draws and medication. He has been agitated verbally.  Subjective: Seen this morning and again this afternoon.  She agreed for blood draws and on second visit agreed for physical exam Patient has been cursing and asking people to get out of the room refusing vitals and medication. Refused examination and discussion on first attempt but on second attempt she allowed me to examine her and briefly interacted but and the end was asking me to "Get the F... Out of  hear" Assessment & Plan:  Acute blood loss anemia on chronic anemia likely secondary to menorrhagia: Received 3 units of PRBC.  Hemoglobin 2.9 g on admission with tachycardia.  Hemoglobin has improved, on repeat CBC drawn today after patient agreed hemoglobin is stable.  Gynecology had seen the patient and she is not agreeable to treatment plan yet has been refusing oral medication.  Will need outpatient follow-up once mental status is stable. Recent Labs  Lab 02/01/20 0404 02/01/20 1211 02/02/20 0418 02/03/20 0726 02/07/20 1050  HGB 7.7* 8.2* 7.9* 7.4* 7.6*  HCT 26.0* 27.2* 26.4* 25.6* 26.2*   Schizoaffective disorder/bipolar disorder: Paranoid thoughts.  Seen by psychiatry recommended Haldol twice a day or IM, patient has been refusing p.o. and IM medication, on Cogentin also recommended patient has been uncooperative refusing medication.  Continue plan of care as per psychiatry.  Nutrition continue gentle hydration while in the hospital and DC IV fluid upon discharge  DVT prophylaxis: Place and maintain sequential compression device Start: 02/02/20 1143 Place and maintain sequential compression device Start: 02/02/20 1140 Code Status: Full code Family Communication: plan of care discussed with patient at bedside.  Status is: Inpatient  Remains inpatient appropriate because:Unsafe d/c plan Dispo: The patient is from: Home              Anticipated d/c is to: Inpatient psychiatric unit.  Patient had IVC.              Anticipated d/c date is: when bed available              Patient currently is medically stable to d/c.  Diet Order  Diet regular Room service appropriate? Yes; Fluid consistency: Thin  Diet effective now                  Body mass index is 18.79 kg/m.  Consultants:see note  Procedures:see note Microbiology:see note  Medications: Scheduled Meds: . benztropine  1 mg Oral Daily  . folic acid  1 mg Oral Daily  . haloperidol  5 mg Oral BID   Or  .  haloperidol lactate  5 mg Intramuscular BID  . megestrol  40 mg Oral BID  . multivitamin with minerals  1 tablet Oral Daily  . thiamine  100 mg Oral Daily  . vitamin B-12  1,000 mcg Oral Daily   Continuous Infusions: . sodium chloride    . lactated ringers 75 mL/hr at 02/06/20 0349    Antimicrobials: Anti-infectives (From admission, onward)   None       Objective: Vitals: Today's Vitals   02/05/20 1934 02/05/20 2158 02/06/20 1243 02/06/20 2200  BP:  133/83 135/79   Pulse:  86 81   Resp:  17 18   Temp:  98.7 F (37.1 C) 98.7 F (37.1 C)   TempSrc:  Oral Oral   SpO2:  100% 100%   Weight:      Height:      PainSc: 0-No pain   0-No pain    Intake/Output Summary (Last 24 hours) at 02/07/2020 1537 Last data filed at 02/06/2020 1732 Gross per 24 hour  Intake 1546.01 ml  Output --  Net 1546.01 ml   Filed Weights   01/31/20 0440  Weight: 54.4 kg   Weight change:    Intake/Output from previous day: 06/10 0701 - 06/11 0700 In: 2019 [P.O.:1183; I.V.:836] Out: -  Intake/Output this shift: No intake/output data recorded.  Examination:  General exam: Alert awake oriented to self, place, people,NAD, weak appearing. HEENT:Oral mucosa moist, Ear/Nose WNL grossly,dentition normal. Respiratory system: bilaterally clear,no wheezing or crackles,no use of accessory muscle, non tender. Cardiovascular system: S1 & S2 +, regular, No JVD. Gastrointestinal system: Abdomen soft, NT,ND, BS+. Nervous System:Alert, awake, moving extremities and grossly nonfocal Extremities: No edema, distal peripheral pulses palpable.  Skin: No rashes,no icterus. MSK: Normal muscle bulk,tone, power  Data Reviewed: I have personally reviewed following labs and imaging studies CBC: Recent Labs  Lab 02/01/20 0404 02/01/20 1211 02/02/20 0418 02/03/20 0726 02/07/20 1050  WBC 6.2 5.9 5.3 6.7 7.2  HGB 7.7* 8.2* 7.9* 7.4* 7.6*  HCT 26.0* 27.2* 26.4* 25.6* 26.2*  MCV 78.1* 78.4* 78.1* 80.0 78.7*   PLT 234 261 293 396 353*   Basic Metabolic Panel: Recent Labs  Lab 02/01/20 0404 02/07/20 1050  NA 136 140  K 3.7 4.6  CL 103 105  CO2 26 25  GLUCOSE 102* 100*  BUN 13 12  CREATININE 0.74 0.86  CALCIUM 8.4* 8.9   GFR: Estimated Creatinine Clearance: 70.9 mL/min (by C-G formula based on SCr of 0.86 mg/dL). Liver Function Tests: Recent Labs  Lab 02/07/20 1050  AST 13*  ALT 10  ALKPHOS 40  BILITOT 0.5  PROT 6.4*  ALBUMIN 3.3*   No results for input(s): LIPASE, AMYLASE in the last 168 hours. No results for input(s): AMMONIA in the last 168 hours. Coagulation Profile: No results for input(s): INR, PROTIME in the last 168 hours. Cardiac Enzymes: No results for input(s): CKTOTAL, CKMB, CKMBINDEX, TROPONINI in the last 168 hours. BNP (last 3 results) No results for input(s): PROBNP in the last 8760 hours. HbA1C: No results for  input(s): HGBA1C in the last 72 hours. CBG: No results for input(s): GLUCAP in the last 168 hours. Lipid Profile: No results for input(s): CHOL, HDL, LDLCALC, TRIG, CHOLHDL, LDLDIRECT in the last 72 hours. Thyroid Function Tests: No results for input(s): TSH, T4TOTAL, FREET4, T3FREE, THYROIDAB in the last 72 hours. Anemia Panel: No results for input(s): VITAMINB12, FOLATE, FERRITIN, TIBC, IRON, RETICCTPCT in the last 72 hours. Sepsis Labs: No results for input(s): PROCALCITON, LATICACIDVEN in the last 168 hours.  Recent Results (from the past 240 hour(s))  SARS Coronavirus 2 by RT PCR (hospital order, performed in [facility] hospital lab) Nasopharyngeal Nasopharyngeal Swab     Status: None   Collection Time: 01/31/20  5:06 AM   Specimen: Nasopharyngeal Swab  Result Value Ref Range Status   SARS Coronavirus 2 NEGATIVE NEGATIVE Final    Comment: (NOTE) SARS-CoV-2 target nucleic acids are NOT DETECTED. The SARS-CoV-2 RNA is generally detectable in upper and lower respiratory specimens during the acute phase of infection. The  lowest concentration of SARS-CoV-2 viral copies this assay can detect is 250 copies / mL. A negative result does not preclude SARS-CoV-2 infection and should not be used as the sole basis for treatment or other patient management decisions.  A negative result may occur with improper specimen collection / handling, submission of specimen other than nasopharyngeal swab, presence of viral mutation(s) within the areas targeted by this assay, and inadequate number of viral copies (<250 copies / mL). A negative result must be combined with clinical observations, patient history, and epidemiological information. Fact Sheet for Patients:   StrictlyIdeas.no Fact Sheet for Healthcare Providers: BankingDealers.co.za This test is not yet approved or cleared  by the [place] FDA and has been authorized for detection and/or diagnosis of SARS-CoV-2 by FDA under an Emergency Use Authorization (EUA).  This EUA will remain in effect (meaning this test can be used) for the duration of the COVID-19 declaration under Section 564(b)(1) of the Act, 21 U.S.C. section 360bbb-3(b)(1), unless the authorization is terminated or revoked sooner. Performed at [facility], [place] [address], [place], [place] [address]       Radiology Studies: No results found.   LOS: 7 days   Antonieta Pert, MD [facility]  02/07/2020, 3:37 PM

## 2020-02-07 NOTE — Progress Notes (Signed)
Patient did not want Vitals or Assessment  done or the medications given.  Patient becomes agitated when staff is talking with the patient. Unfortunately, no labs were drawn either. PCP on call to be notified.

## 2020-02-07 NOTE — TOC Progression Note (Signed)
Transition of Care [facility]) - Progression Note    Patient Details  Name: Valerie Howard MRN: [id number] Date of Birth: 22-Apr-1976  [facility]) CM/SW Contact  Ross Ludwig, [place] Phone Number: 02/07/2020, 5:31 PM  Clinical Narrative:     CSW spoke to [place] at  [facility] inpatient behavioral health [phone] to update them that patient did allow labs to be drawn.  [facility] requested that the clinicals be faxed to them again with updated labs so they can review patient's information.  CSW continuing to try to find inpatient psych placement for patient.        Expected Discharge Plan and Services                                                 Social Determinants of Health (SDOH) Interventions    Readmission Risk Interventions No flowsheet data found.

## 2020-02-08 NOTE — Progress Notes (Addendum)
PT refused assessment and night time meds. Pt states that she does not need those medications. Night time provider made aware.

## 2020-02-08 NOTE — Progress Notes (Signed)
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 She was apparently living in her car acting paranoid, appears not to be taking medications or following with treatment as previously outlined.She indicated that she believes her husband is "forcing her to apply for disability and that someone is poisoning her food" . Apparently she has has difficulty sleeping has not bathed, does not perform ADLs and has had very poor p.o. intake over  few days. Family also indicated that patient has had large-volume menses for at least 4 days  with large clots but this is a chronic condition for her over at least the last decade if not longer. Hemoglobin previously has been noted low in the 4-5 range. In the ED, patient's hemoglobin noted to be markedly low at 2.9 with remarkably low MCV at 63.5. Creatinine also elevated at 1.03 consistent with history of poor p.o. intake. Behavioral health involved in the ED. Patient was subsequently seen by gynecology on 6/6 but patient has been refusing any treatment options and examination.  Psychiatry was also consulted who recommended inpatient psych placement. Patient has been refusing vital checks blood draws and medication. He has been agitated verbally. Patient agreed for blood 6/11 and remains stable.  She is hemodynamically stable. Patient medically stable for discharge to inpatient psych  Subjective: Running, did not allow examination but able to interact.  Sitter at bedside  Assessment & Plan:  Acute blood loss anemia on chronic anemia likely secondary to menorrhagia: Received 3 units of PRBC.  Hemoglobin 2.9 g on admission with tachycardia.  Hemoglobin  has improved on repeat labs 6/11. Gynecology had seen the patient and she is not agreeable to treatment plan yet has been refusing oral medication.  Will need outpatient follow-up once mental status is stable.  Hemoglobin is a stable. Recent Labs  Lab 02/01/20 1211 02/02/20 0418 02/03/20 0726 02/07/20 1050  HGB 8.2* 7.9* 7.4* 7.6*  HCT 27.2* 26.4* 25.6* 26.2*   Schizoaffective disorder/bipolar disorder: Paranoid thoughts.  Seen by psychiatry recommended Haldol twice a day or IM, patient has been refusing p.o. and IM medication, on Cogentin also recommended patient has been uncooperative refusing medication.  Continue plan of care as per psychiatry.  Nutrition continue gentle hydration while in the hospital and DC IV fluid upon discharge  DVT prophylaxis: Place and maintain sequential compression device Start: 02/02/20 1143 Place and maintain sequential compression device Start: 02/02/20 1140 Code Status: Full code Family Communication: plan of care discussed with patient at bedside.  Status is: Inpatient  Remains inpatient appropriate because:Unsafe d/c plan Dispo: The patient is from: Home              Anticipated d/c is to: Inpatient psychiatric unit.  Patient had IVC.              Anticipated d/c date is: when bed available              Patient currently is medically stable to d/c.  Diet Order            Diet regular Room service appropriate? Yes; Fluid consistency: Thin  Diet effective now                  Body mass index is 18.79 kg/m.  Consultants:see note  Procedures:see note Microbiology:see note  Medications: Scheduled Meds: . benztropine  1 mg Oral Daily  . folic acid  1 mg Oral Daily  . haloperidol  5 mg Oral BID   Or  . haloperidol lactate  5 mg Intramuscular BID  . megestrol  40 mg Oral BID  . multivitamin with minerals  1 tablet Oral Daily  . thiamine  100 mg Oral Daily  . vitamin B-12  1,000 mcg Oral Daily   Continuous Infusions: . sodium chloride     . lactated ringers 75 mL/hr at 02/07/20 1756    Antimicrobials: Anti-infectives (From admission, onward)   None       Objective: Vitals: Today's Vitals   02/06/20 1243 02/06/20 2200 02/07/20 2100 02/07/20 2217  BP: 135/79   100/75  Pulse: 81   90  Resp: 18   18  Temp: 98.7 F (37.1 C)   99.4 F (37.4 C)  TempSrc: Oral   Oral  SpO2: 100%   98%  Weight:      Height:      PainSc:  0-No pain 0-No pain     Intake/Output Summary (Last 24 hours) at 02/08/2020 1209 Last data filed at 02/08/2020 0609 Gross per 24 hour  Intake 3236.93 ml  Output --  Net 3236.93 ml   Filed Weights   01/31/20 0440  Weight: 54.4 kg   Weight change:    Intake/Output from previous day: 06/11 0701 - 06/12 0700 In: 3236.9 [P.O.:500; I.V.:2736.9] Out: -  Intake/Output this shift: No intake/output data recorded.  Examination: Patient is alert awake, interactive but mildly agitated. I was able to do physical examination 6/11.  She refused physical exam today but appears unchanged grossly  Data Reviewed: I have personally reviewed following labs and imaging studies CBC: Recent Labs  Lab 02/01/20 1211 02/02/20 0418 02/03/20 0726 02/07/20 1050  WBC 5.9 5.3 6.7 7.2  HGB 8.2* 7.9* 7.4* 7.6*  HCT 27.2* 26.4* 25.6* 26.2*  MCV 78.4* 78.1* 80.0 78.7*  PLT 261 293 396 782*   Basic Metabolic Panel: Recent Labs  Lab 02/07/20 1050  NA 140  K 4.6  CL 105  CO2 25  GLUCOSE 100*  BUN 12  CREATININE 0.86  CALCIUM 8.9   GFR: Estimated Creatinine Clearance: 70.9 mL/min (by C-G formula based on SCr of 0.86 mg/dL). Liver Function Tests: Recent Labs  Lab 02/07/20 1050  AST 13*  ALT 10  ALKPHOS 40  BILITOT 0.5  PROT 6.4*  ALBUMIN 3.3*   No results for input(s): LIPASE, AMYLASE in the last 168 hours. No results for input(s): AMMONIA in the last 168 hours. Coagulation Profile: No results for input(s): INR, PROTIME in the last 168 hours. Cardiac Enzymes: No results for input(s):  CKTOTAL, CKMB, CKMBINDEX, TROPONINI in the last 168 hours. BNP (last 3 results) No results for input(s): PROBNP in the last 8760 hours. HbA1C: No results for input(s): HGBA1C in the last 72 hours. CBG: No results for input(s): GLUCAP in the last 168 hours. Lipid Profile: No results for input(s): CHOL, HDL, LDLCALC, TRIG, CHOLHDL, LDLDIRECT in the last 72 hours. Thyroid Function Tests: No results for input(s): TSH, T4TOTAL, FREET4, T3FREE, THYROIDAB in the last 72 hours. Anemia Panel: No results for input(s): VITAMINB12, FOLATE, FERRITIN, TIBC, IRON, RETICCTPCT in the last 72 hours. Sepsis Labs: No results for input(s): PROCALCITON, LATICACIDVEN in the last 168 hours.  Recent Results (from the past 240 hour(s))  SARS Coronavirus 2 by RT PCR (hospital order, performed  in [place] lab) Nasopharyngeal Nasopharyngeal Swab     Status: None   Collection Time: 01/31/20  5:06 AM   Specimen: Nasopharyngeal Swab  Result Value Ref Range Status   SARS Coronavirus 2 NEGATIVE NEGATIVE Final    Comment: (NOTE) SARS-CoV-2 target nucleic acids are NOT DETECTED. The SARS-CoV-2 RNA is generally detectable in upper and lower respiratory specimens during the acute phase of infection. The lowest concentration of SARS-CoV-2 viral copies this assay can detect is 250 copies / mL. A negative result does not preclude SARS-CoV-2 infection and should not be used as the sole basis for treatment or other patient management decisions.  A negative result may occur with improper specimen collection / handling, submission of specimen other than nasopharyngeal swab, presence of viral mutation(s) within the areas targeted by this assay, and inadequate number of viral copies (<250 copies / mL). A negative result must be combined with clinical observations, patient history, and epidemiological information. Fact Sheet for Patients:   StrictlyIdeas.no Fact Sheet for Healthcare  Providers: BankingDealers.co.za This test is not yet approved or cleared  by the [place] FDA and has been authorized for detection and/or diagnosis of SARS-CoV-2 by FDA under an Emergency Use Authorization (EUA).  This EUA will remain in effect (meaning this test can be used) for the duration of the COVID-19 declaration under Section 564(b)(1) of the Act, 21 U.S.C. section 360bbb-3(b)(1), unless the authorization is terminated or revoked sooner. Performed at [facility], [place] [address] [place], [place] [address]       Radiology Studies: No results found.   LOS: 8 days   Antonieta Pert, MD [facility]  02/08/2020, 12:09 PM

## 2020-02-09 NOTE — Progress Notes (Signed)
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 She was apparently living in her car acting paranoid, appears not to be taking medications or following with treatment as previously outlined.She indicated that she believes her husband is "forcing her to apply for disability and that someone is poisoning her food" . Apparently she has has difficulty sleeping has not bathed, does not perform ADLs and has had very poor p.o. intake over  few days. Family also indicated that patient has had large-volume menses for at least 4 days  with large clots but this is a chronic condition for her over at least the last decade if not longer. Hemoglobin previously has been noted low in the 4-5 range. In the ED, patient's hemoglobin noted to be markedly low at 2.9 with remarkably low MCV at 63.5. Creatinine also elevated at 1.03 consistent with history of poor p.o. intake. Behavioral health involved in the ED. Patient was subsequently seen by gynecology on 6/6 but patient has been refusing any treatment options and examination.  Psychiatry was also consulted who recommended inpatient psych placement. Patient has been refusing vital checks blood draws and medication. He has been agitated verbally. Patient agreed for blood 6/11 and remains stable.  She is hemodynamically stable. Patient medically stable for discharge to inpatient psych  Subjective: Sitter at the bedside.  Patient reading Bible. No complaints. Allowed me to examine her today. At the end again used obscenities and  stated to get out of the room. Resting comfortably on the bed.  Assessment & Plan:  Acute blood loss anemia on chronic  anemia likely secondary to menorrhagia: Received 3 units of PRBC.  Hemoglobin 2.9 g on admission with tachycardia.  Hemoglobin has improved on repeat labs 6/11. Gynecology had seen the patient and she is not agreeable to treatment plan yet has been refusing oral medication.  Will need outpatient follow-up once mental status is stable.  Hemoglobin is a stable. Recent Labs  Lab 02/03/20 0726 02/07/20 1050  HGB 7.4* 7.6*  HCT 25.6* 26.2*   Schizoaffective disorder/bipolar disorder: Paranoid thoughts.  Seen by psychiatry recommended Haldol twice a day or IM, patient has been refusing p.o. and IM medication, on Cogentin also recommended patient has been uncooperative refusing medication.  Continue plan of care as per psychiatry.  This is her longstanding issues with mental illness.  Nutrition continue gentle hydration while in the hospital and DC IV fluid upon discharge  DVT prophylaxis: Place and maintain sequential compression device Start: 02/02/20 1143 Place and maintain sequential compression device Start: 02/02/20 1140 Code Status: Full code Family Communication: plan of care discussed with patient at bedside.  Status is: Inpatient  Remains inpatient appropriate because:Unsafe d/c plan Dispo: The patient is from: Home/homeless              Anticipated d/c is to: Inpatient psychiatric unit.  Patient has IVC.              Anticipated d/c date is: when bed available              Patient currently is medically stable to d/c.  Plan is for discharge to his inpatient psychiatric once accepted.  Diet Order            Diet  regular Room service appropriate? Yes; Fluid consistency: Thin  Diet effective now                  Body mass index is 18.79 kg/m.  Consultants:see note  Procedures:see note Microbiology:see note  Medications: Scheduled Meds: . benztropine  1 mg Oral Daily  . folic acid  1 mg Oral Daily  . haloperidol  5 mg Oral BID   Or  . haloperidol lactate  5 mg  Intramuscular BID  . megestrol  40 mg Oral BID  . multivitamin with minerals  1 tablet Oral Daily  . thiamine  100 mg Oral Daily  . vitamin B-12  1,000 mcg Oral Daily   Continuous Infusions: . sodium chloride    . lactated ringers 75 mL/hr at 02/09/20 2778    Antimicrobials: Anti-infectives (From admission, onward)   None       Objective: Vitals: Today's Vitals   02/08/20 1424 02/08/20 2155 02/08/20 2310 02/09/20 0611  BP: 125/69  139/85 119/74  Pulse: 87  87 85  Resp: 18  18 16   Temp: 98.8 F (37.1 C)  98.4 F (36.9 C) 98.9 F (37.2 C)  TempSrc: Oral  Oral Oral  SpO2: 100%  100% 100%  Weight:      Height:      PainSc:  0-No pain      Intake/Output Summary (Last 24 hours) at 02/09/2020 1143 Last data filed at 02/09/2020 0700 Gross per 24 hour  Intake 460 ml  Output --  Net 460 ml   Filed Weights   01/31/20 0440  Weight: 54.4 kg   Weight change:    Intake/Output from previous day: 06/12 0701 - 06/13 0700 In: 460 [P.O.:460] Out: -  Intake/Output this shift: No intake/output data recorded.  Examination: General exam: AAOx3 not in acute distress, on room air.[name] Respiratory system: bilaterally clear,no wheezing or crackles,no use of accessory muscle Cardiovascular system: S1 & S2 +, No JVD,. Gastrointestinal system: Abdomen soft, NT. Nervous System:Alert, awake, moving extremities and grossly nonfocal Extremities: No edema, distal peripheral pulses palpable.  Skin: No rashes,no icterus. MSK: Normal muscle bulk,tone, power  Data Reviewed: I have personally reviewed following labs and imaging studies CBC: Recent Labs  Lab 02/03/20 0726 02/07/20 1050  WBC 6.7 7.2  HGB 7.4* 7.6*  HCT 25.6* 26.2*  MCV 80.0 78.7*  PLT 396 242*   Basic Metabolic Panel: Recent Labs  Lab 02/07/20 1050  NA 140  K 4.6  CL 105  CO2 25  GLUCOSE 100*  BUN 12  CREATININE 0.86  CALCIUM 8.9   GFR: Estimated Creatinine Clearance: 70.9 mL/min (by C-G formula based on  SCr of 0.86 mg/dL). Liver Function Tests: Recent Labs  Lab 02/07/20 1050  AST 13*  ALT 10  ALKPHOS 40  BILITOT 0.5  PROT 6.4*  ALBUMIN 3.3*   No results for input(s): LIPASE, AMYLASE in the last 168 hours. No results for input(s): AMMONIA in the last 168 hours. Coagulation Profile: No results for input(s): INR, PROTIME in the last 168 hours. Cardiac Enzymes: No results for input(s): CKTOTAL, CKMB, CKMBINDEX, TROPONINI in the last 168 hours. BNP (last 3 results) No results for input(s): PROBNP in the last 8760 hours. HbA1C: No results for input(s): HGBA1C in the last 72 hours. CBG: No results for input(s): GLUCAP in the last 168 hours. Lipid Profile: No results for input(s): CHOL, HDL, LDLCALC, TRIG, CHOLHDL, LDLDIRECT in the last 72 hours. Thyroid Function Tests: No results for input(s): TSH, T4TOTAL,  FREET4, T3FREE, THYROIDAB in the last 72 hours. Anemia Panel: No results for input(s): VITAMINB12, FOLATE, FERRITIN, TIBC, IRON, RETICCTPCT in the last 72 hours. Sepsis Labs: No results for input(s): PROCALCITON, LATICACIDVEN in the last 168 hours.  Recent Results (from the past 240 hour(s))  SARS Coronavirus 2 by RT PCR (hospital order, performed in [facility] hospital lab) Nasopharyngeal Nasopharyngeal Swab     Status: None   Collection Time: 01/31/20  5:06 AM   Specimen: Nasopharyngeal Swab  Result Value Ref Range Status   SARS Coronavirus 2 NEGATIVE NEGATIVE Final    Comment: (NOTE) SARS-CoV-2 target nucleic acids are NOT DETECTED. The SARS-CoV-2 RNA is generally detectable in upper and lower respiratory specimens during the acute phase of infection. The lowest concentration of SARS-CoV-2 viral copies this assay can detect is 250 copies / mL. A negative result does not preclude SARS-CoV-2 infection and should not be used as the sole basis for treatment or other patient management decisions.  A negative result may occur with improper specimen collection / handling,  submission of specimen other than nasopharyngeal swab, presence of viral mutation(s) within the areas targeted by this assay, and inadequate number of viral copies (<250 copies / mL). A negative result must be combined with clinical observations, patient history, and epidemiological information. Fact Sheet for Patients:   StrictlyIdeas.no Fact Sheet for Healthcare Providers: BankingDealers.co.za This test is not yet approved or cleared  by the [place] FDA and has been authorized for detection and/or diagnosis of SARS-CoV-2 by FDA under an Emergency Use Authorization (EUA).  This EUA will remain in effect (meaning this test can be used) for the duration of the COVID-19 declaration under Section 564(b)(1) of the Act, 21 U.S.C. section 360bbb-3(b)(1), unless the authorization is terminated or revoked sooner. Performed at [facility], [place] [address], [place], [place] [address]       Radiology Studies: No results found.   LOS: 9 days   Antonieta Pert, MD [facility]  02/09/2020, 11:43 AM

## 2020-02-10 NOTE — Progress Notes (Signed)
Patient was seen at the bedside and I attempted to interview the patient Patient was reading the new testament Bible, there appeared to be notes next her table from the [place] tells me "you are the doctor you should know what the problem is" I attempted to further engage with the patient and she stated "if you do not know what you are doing get the hell out" She then proceeded to use varying levels of profanity against this examiner and refused my interview of her and stopped off to the bathroom all the while mounting expletives It is difficult for me to evaluate this patient-she is not allowing lab work, exam and I feel she is better placed at a psychiatric facility as she is IVC I have asked charge nurse on the unit and case manager to look into placement   I am not charging her for today's exam as she is refusing care   Verneita Griffes, MD [facility] 10:18 AM

## 2020-02-10 NOTE — TOC Progression Note (Addendum)
Transition of Care [facility]) - Progression Note    Patient Details  Name: Valerie Howard MRN: [id number] Date of Birth: 05/29/1976  Transition of Care [facility]) CM/SW Contact  [name], RN Phone Number: 02/10/2020, 3:51 PM  Clinical Narrative:    [facility] [phone] was called, they at capacity, there are no beds.  Will reach out to [facility] again and [facility].        Expected Discharge Plan and Services                                                 Social Determinants of Health (SDOH) Interventions    Readmission Risk Interventions No flowsheet data found.

## 2020-02-10 NOTE — Progress Notes (Signed)
Pt refusing AM Medications. Pt using Profanity and ordering RN out of room Pt states " You get the hell out" MD is aware

## 2020-02-10 NOTE — Progress Notes (Signed)
Pt refusing AM assessment this morning. Pt states " You are not going to do anything just get out " Pt also refusing safety measures Bed not in low position and RN attempted to discuss with Pt safety measures Pt became irritated and stated " I don't care and quit talking and get the hell out of here" Sitter remains at bedside

## 2020-02-11 NOTE — Progress Notes (Addendum)
I went into the room and attempted to talk with the patient I attempted to give results but she had rapidfire questions and would not allow me to discuss results with her When I attempted to get to the computer to look at what she had asked in terms of what her hospital results are she says "I do not trust you"  I politely subsequently been explained to her that I am willing to talk with her and give her her results She asked me then to leave the room and before she could escalate verbally [which she did in terms of profanity as I was leaving the room] I excused myself from the room.  informed by Randall Hiss CSW that IVC expires 1700 today  psych re-consulted for assist in management  No charge  Verneita Griffes, MD [facility] 9:59 AM

## 2020-02-11 NOTE — Consult Note (Signed)
Psych re-consulted as patient continues to reject all medical services and providers at this time. Patient is very familiar to our service and usually presents in this manner. She has done well on IM Haldol and oral haldol. Her case is complicated by ongoing need for psychiatric services and chronically low hemoglobin last at 7.2. We are unable to obtain new labs as patient continues to yell obscenities and decline services. Will recommend continuing IVC, please contact medical social worker for assistance with this. Will also initiate force med order to help with stabilization of patient. Patient has medications ordered. Will continue to recommend inpatient psychiatric once medically cleared. As previously noted she will need force meds in order to stabilize.

## 2020-02-11 NOTE — Progress Notes (Signed)
Patient asked sitter for crackers, I went to take them to the patient and attempted to have conversation but patient would not speak or look up from bible while I was in the room.

## 2020-02-11 NOTE — Progress Notes (Signed)
Pt refusing medications and assessment. Continued to use profanity and ordering the nurse to leave the room stating,"I don't trust any of ya'll."

## 2020-02-11 NOTE — TOC Progression Note (Addendum)
Transition of Care [facility]) - Progression Note    Patient Details  Name: SIYANA ERNEY MRN: [id number] Date of Birth: Jul 06, 1976  Transition of Care [facility]) CM/SW Contact  Ross Ludwig, [place] Phone Number: 02/11/2020, 6:49 PM  Clinical Narrative:     CSW completed IVC paperwork to continue IVC for patient. IVC paperwork has been renewed, patient will need new IVC on February 18, 2020.  CSW to continue looking for inpatient psych placement for patient.      Expected Discharge Plan and Services    Inpatient psych placement.                                             Social Determinants of Health (SDOH) Interventions    Readmission Risk Interventions No flowsheet data found.

## 2020-02-11 NOTE — Plan of Care (Signed)

## 2020-02-12 NOTE — Consult Note (Signed)
[place] Psychiatry Consult   Reason for Consult:  "shcizoaffective-await placement--need re-eval as refusing all meds--see my pn from 6/14 and 6/15--please IVC if u agree when u see her-thanks" Referring Physician:  Dr. Verlon Au Patient Identification: Valerie Howard MRN:  [id number] Principal Diagnosis: <principal problem not specified> Diagnosis:  Active Problems:   Anemia, iron deficiency   Schizoaffective disorder, bipolar type ([place])   Menorrhagia   Acute blood loss anemia   Paranoia (psychosis) ([place])   Total Time spent with patient: 30 minutes  Subjective: Patient states "I do not need my care team, get your Zacarias Pontes shit  somewhere else, take your forked tongue out of here, I would not touch you for a million dollars."  HPI: Patient assessed by nurse practitioner.   Valerie Howard is a 44 y.o. female patient.  Patient is alert and oriented x4 currently Patient is irritable and refuses to participate in assessment. Patient appears disorganized and paranoid states "I do not know who [name] is." Patient with disorganized and tangential conversation "you are Lucifer and you are a snake." Patient refuses to participate further in assessment. Patient states "I will only talk to you with my attorney present."     Past Psychiatric History: Schizoaffective disorder, bipolar type  Risk to Self:   None reported Risk to Others:   None reported Prior Inpatient Therapy:   : [place] Prior Outpatient Therapy:   None reported  Past Medical History:  Past Medical History:  Diagnosis Date   History of radiation therapy 07/18/17-07/24/17   right ear, operative bed 12 Gy in 3 fractions   Keloid    right ear   Paranoid behavior ([place])    Schizophrenia ([place])     Past Surgical History:  Procedure Laterality Date   CESAREAN SECTION     KENALOG INJECTION Bilateral 07/17/2017   Procedure: KENALOG INJECTION;  Surgeon: Wallace Going, DO;  Location: [facility];  Service: Plastics;  Laterality: Bilateral;   MASS EXCISION N/A 07/17/2017   Procedure: EXCISION KELOID OF RIGHT EAR WITH INTROPERATIVE KENALOG INJECTION AND POST OP RADIATION;  Surgeon: Wallace Going, DO;  Location: [facility];  Service: Plastics;  Laterality: N/A;   TUBAL LIGATION     Family History:  Family History  Problem Relation Age of Onset   Hypertension Other    Diabetes Other    Cancer Other    Keloids Mother    Breast cancer Maternal Grandmother    Family Psychiatric  History: Unknown Social History:  Social History   Substance and Sexual Activity  Alcohol Use Yes   Comment: socially     Social History   Substance and Sexual Activity  Drug Use No    Social History   Socioeconomic History   Marital status: Divorced    Spouse name: Not on file   Number of children: 4   Years of education: Not on file   Highest education level: Not on file  Occupational History   Occupation: unemployed  Tobacco Use   Smoking status: Never Smoker   Smokeless tobacco: Never Used  Scientific laboratory technician Use: Never used  Substance and Sexual Activity   Alcohol use: Yes    Comment: socially   Drug use: No   Sexual activity: Not Currently    Birth control/protection: Surgical  Other Topics Concern   Not on file  Social History Narrative   Not on file   Social  Determinants of Health   Financial Resource Strain:    Difficulty of Paying Living Expenses:   Food Insecurity:    Worried About Charity fundraiser in the Last Year:    Arboriculturist in the Last Year:   Transportation Needs:    Film/video editor (Medical):    Lack of Transportation (Non-Medical):   Physical Activity:    Days of Exercise per Week:    Minutes of Exercise per Session:   Stress:    Feeling of Stress :   Social Connections:    Frequency of Communication with Friends and Family:    Frequency of Social Gatherings with Friends  and Family:    Attends Religious Services:    Active Member of Clubs or Organizations:    Attends Archivist Meetings:    Marital Status:    Additional Social History:    Allergies:   Allergies  Allergen Reactions   Penicillins Hives and Itching    Has patient had a PCN reaction causing immediate rash, facial/tongue/throat swelling, SOB or lightheadedness with hypotension:Patient refuses to answer (PRA) Has patient had a PCN reaction causing severe rash involving mucus membranes or skin necrosis:PRA Has patient had a PCN reaction that required hospitalization PRA Has patient had a PCN reaction occurring within the last 10 years:PRA If all of the above answers are "NO", then may proceed with Cephalosporin use.     Labs: No results found for this or any previous visit (from the past 48 hour(s)).  Current Facility-Administered Medications  Medication Dose Route Frequency Provider Last Rate Last Admin   acetaminophen (TYLENOL) suppository 650 mg  650 mg Rectal Q4H PRN Mansy, Jan A, MD       acetaminophen (TYLENOL) tablet 650 mg  650 mg Oral Q4H PRN Mansy, Jan A, MD   650 mg at 02/04/20 1736   benztropine (COGENTIN) tablet 1 mg  1 mg Oral Daily Patrecia Pour, NP       folic acid (FOLVITE) tablet 1 mg  1 mg Oral Daily Little Ishikawa, MD   1 mg at 02/09/20 0950   haloperidol (HALDOL) tablet 5 mg  5 mg Oral BID Patrecia Pour, NP       Or   haloperidol lactate (HALDOL) injection 5 mg  5 mg Intramuscular BID Patrecia Pour, NP       lactated ringers infusion   Intravenous Continuous Antonieta Pert, MD 30 mL/hr at 02/09/20 1554 Rate Change at 02/09/20 1554   megestrol (MEGACE) tablet 40 mg  40 mg Oral BID Donnamae Jude, MD   40 mg at 02/02/20 0930   morphine 2 MG/ML injection 2 mg  2 mg Intravenous Q4H PRN Mansy, Jan A, MD       multivitamin with minerals tablet 1 tablet  1 tablet Oral Daily Little Ishikawa, MD   1 tablet at 02/09/20 0950   ondansetron (ZOFRAN)  injection 4 mg  4 mg Intravenous Q4H PRN Mansy, Jan A, MD       thiamine tablet 100 mg  100 mg Oral Daily Little Ishikawa, MD   100 mg at 02/09/20 2409   vitamin B-12 (CYANOCOBALAMIN) tablet 1,000 mcg  1,000 mcg Oral Daily Arlan Organ, DO   1,000 mcg at 02/09/20 7353    Musculoskeletal: Strength & Muscle Tone: within normal limits Gait & Station: normal Patient leans: N/A  Psychiatric Specialty Exam: Physical Exam  Nursing note and vitals reviewed. Constitutional: She is  oriented to person, place, and time. She appears well-developed.  HENT:  Head: Normocephalic.  Cardiovascular: Normal rate.  Respiratory: Effort normal.  Musculoskeletal:        General: Normal range of motion.  Neurological: She is alert and oriented to person, place, and time.    Review of Systems  Constitutional: Negative.   HENT: Negative.   Eyes: Negative.   Respiratory: Negative.   Cardiovascular: Negative.   Gastrointestinal: Negative.   Genitourinary: Negative.   Musculoskeletal: Negative.   Skin: Negative.   Neurological: Negative.     Blood pressure (!) 170/99, pulse (!) 102, temperature 98.7 F (37.1 C), temperature source Oral, resp. rate 18, height 5\' 7"  (1.702 m), weight 54.4 kg, last menstrual period 01/28/2020, SpO2 100 %.Body mass index is 18.79 kg/m.  General Appearance: Casual  Eye Contact:  Minimal  Speech:  Pressured  Volume:  Increased  Mood:  Irritable  Affect:  Non-Congruent and Labile  Thought Process:  Disorganized and Descriptions of Associations: Tangential  Orientation:  Full (Time, Place, and Person)  Thought Content:  Delusions, Paranoid Ideation and Tangential  Suicidal Thoughts:  No  Homicidal Thoughts:  No  Memory:  Immediate;   Fair Recent;   Fair Remote;   Fair  Judgement:  Impaired  Insight:  Lacking  Psychomotor Activity:  Normal  Concentration:  Concentration: Fair and Attention Span: Fair  Recall:  [employer] of Knowledge:  Fair  Language:  Fair   Akathisia:  No  Handed:  Right  AIMS (if indicated):     Assets:  Communication Skills Resilience Social Support  ADL's:  Intact  Cognition:  WNL  Sleep:        Treatment Plan Summary: Patient discussed Dr. Dwyane Dee.   Patient is a 44 year old female well-known to behavioral health service.  Patient has history of schizoaffective disorder and responds well to Haldol.  Patient would benefit from forced medications to assist with stabilization. Based on my assessment today, patient would benefit from inpatient psychiatric treatment once medically cleared.  Patient remains involuntarily committed.  Recommendations: -Recommend consider continue one-to-one sitter for safety. -Recommend Haldol 10 mg twice daily IM or p.o. -Recommend Cogentin 1 mg twice daily IM or p.o. -Recommend Ativan 2 mg twice daily IM or p.o., as needed agitation -Recommend social work consult to [facility].  Disposition: Recommend psychiatric Inpatient admission when medically cleared. Supportive therapy provided about ongoing stressors.  Emmaline Kluver, FNP 02/12/2020 9:29 AM

## 2020-02-12 NOTE — TOC Progression Note (Addendum)
Transition of Care [facility]) - Progression Note    Patient Details  Name: Valerie Howard MRN: [id number] Date of Birth: [birth year]  Transition of Care [facility]) CM/SW Contact  Ross Ludwig, [place] Phone Number: 02/12/2020, 5:18 PM  Clinical Narrative:     CSW faxed referral to the following inpatient psych facilities, they will review and contact CSW if they are able to accept patient.  1.[facility] 2. [facility] 3. [facility] 4. [facility]  Attempted to contact [name], and there was no answer and not voice mail, also tried to contact [facility] again, and no answer, will try again tomorrow.  Contacted [facility] behavioral health left a message awaiting for call back.         Expected Discharge Plan and Services    Inpatient Pych once a bed is available.                                             Social Determinants of Health (SDOH) Interventions    Readmission Risk Interventions No flowsheet data found.

## 2020-02-12 NOTE — Progress Notes (Signed)
Once again patient refuses all manner of care despite gently chatting with her and roundly cursed me out [as per usual ]on my coming into the room   Discussed with unit director in addition to [facility] and overall nursing officer with regards to forcing meds We will clarify if the policy that exists at behavioral health would cover [place] in terms of administering Haldol IM twice daily here as recommended by Dr. Dwyane Dee psychiatry She is of no imminent danger to herself or others and is not physically abusive just verbally and de-escalate quickly once she is left unperturbed She is extremely appropriate for inpatient psychiatry where they can manage this in a more holistic manner  Once we get clarity regarding policies and procedures we will force meds as above  No charge  Verneita Griffes, MD [facility] 4:46 PM

## 2020-02-12 NOTE — Progress Notes (Signed)
Patient refused pm shift vitals. Care team made aware

## 2020-02-12 NOTE — Progress Notes (Signed)
Patient has refused most all of medical care today, including medicines. Still manic behavior and flight of ideas. Gets agitated and verbally aggressive easily when asked to do care.Eulas Post, RN

## 2020-02-12 NOTE — Progress Notes (Signed)
Patient is very talkative this morning. Agreeing to let Rn do minimal assessment. Also agreeing to have labs drawn if needed. Sitter present. Having flight of ideas and often doesn't answer questions appropriately. Thinks "someone is poisoning the food and water" that "is changing her skin color all over. Also, "my feet look like they've been in a coffin". Eulas Post, RN

## 2020-02-13 MED ORDER — RISPERIDONE MICROSPHERES ER 50 MG IM SRER
50.0000 mg | INTRAMUSCULAR | Status: DC
  Administered 2020-02-13: 50 mg via INTRAMUSCULAR
  Filled 2020-02-13: qty 2

## 2020-02-13 MED ORDER — HALOPERIDOL LACTATE 5 MG/ML IJ SOLN
10.0000 mg | Freq: Two times a day (BID) | INTRAMUSCULAR | Status: DC
  Administered 2020-02-14 – 2020-02-15 (×4): 10 mg via INTRAMUSCULAR
  Filled 2020-02-13 (×6): qty 2

## 2020-02-13 MED ORDER — HALOPERIDOL 5 MG PO TABS
5.0000 mg | ORAL_TABLET | Freq: Two times a day (BID) | ORAL | Status: DC
  Administered 2020-02-16 – 2020-02-17 (×3): 5 mg via ORAL
  Filled 2020-02-13 (×16): qty 1

## 2020-02-13 NOTE — Progress Notes (Signed)
This RN asked patient if she were willing to take any medication to which patient cursed and said she would not.  Notified AC, charge nurse and security that help would be needed to administer IM medications as ordered.  Once security in the room patient agreed to take medication, Haldol 5 mg in L deltoid, Risperdal in R deltoid.  Patient cursing at staff continuously but did not become physically aggressive.

## 2020-02-13 NOTE — TOC Progression Note (Addendum)
Transition of Care [facility]) - Progression Note    Patient Details  Name: Valerie Howard MRN: [id number] Date of Birth: 1976/06/22  Transition of Care [facility]) CM/SW Contact  Ross Ludwig, [place] Phone Number: 02/13/2020, 11:59 AM  Clinical Narrative:     CSW spoke to [facility] and CSW faxed referral to be reviewed by admissions.  CSW also faxed updated Psych note to [facility], [place], [place], [place].  CSW awaiting for review.  CSW faxed referral to [facility] awaiting for a call back if they can accept her.    [facility] referral number is [id number], attempted to contact behavioral health facilities still waiting on call back.    Expected Discharge Plan and Services                                                 Social Determinants of Health (SDOH) Interventions    Readmission Risk Interventions No flowsheet data found.

## 2020-02-13 NOTE — Plan of Care (Signed)

## 2020-02-13 NOTE — Progress Notes (Signed)
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  ?Living in her car acting paranoid, noncompliant with medication Delusional-thanks husband is "forcing her to apply for disability and that someone is poisoning her food" .  Apparently flight of ideas poor p.o. intake no ADL performance Family also indicated that patient has had large-volume menses for at least 4 days  with large clots but this is a chronic condition for her over at least the last decade if not longer. Hemoglobin previously has been noted low in the 4-5 range. In the ED, patient's hemoglobin noted to be markedly low at 2.9 with remarkably low MCV at 63.5.  Creatinine also elevated at 1.03 consistent with history of poor p.o. intake.  Reading Bible Refusing care Routinely curses mebehavioral health involved in the ED. Patient was subsequently seen by gynecology on 6/6 but patient has been refusing any treatment options and examination.  Psychiatry was also consulted who recommended inpatient psych placement. Patient has been refusing vital checks blood draws and medication. He has been agitated verbally. Patient agreed for blood 6/11 and remains stable.  She is hemodynamically stable. Patient medically stable for discharge to inpatient psych  Subjective: Reading Bible writing versus on paper napkins When I attempt to talk to her she asked me "what is the ultrasound show" when I attempt to tell her she gets frustrated gets up and walks to the bathroom and closes the door This is recurrent behavior for the patient She has been routinely cursing out staff and not cooperating with activity Her last labs were on 6/11 She is making her own care extremely  difficult  Assessment & Plan:  Acute blood loss anemia on chronic anemia likely secondary to menorrhagia: Received 3 units of PRBC.  Hemoglobin 2.9 g on admission with tachycardia.  Hemoglobin has improved on repeat labs 6/11. Gynecology offered evaluation and advice regarding meds/procedure patient unwilling to comply  Schizoaffective disorder/bipolar disorder: Paranoid extremely Agitated verbally but today after confirmation of hospital policy in terms of 2 physicians agree that the best care for the patient would be to force meds on her-she was given IM Haldol-she has refused labs today I am increasing Haldol to 10 mg twice daily She will need Haldol decanoate depot preparation to help with compliance  DVT prophylaxis: Place and maintain sequential compression device Start: 02/02/20 1143 Place and maintain sequential compression device Start: 02/02/20 1140 Code Status: Full code Family Communication: plan of care discussed with patient at bedside.  Status is: Inpatient  Remains inpatient appropriate because:Unsafe d/c plan Dispo: The patient is from: Home/homeless              Anticipated d/c is to: Inpatient psychiatric unit.  Patient has IVC.              Anticipated d/c date is: when bed available              Patient currently is medically stable to d/c.  Plan is for discharge to his inpatient psychiatric once accepted.  Diet Order            Diet regular Room service appropriate? Yes; Fluid consistency: Thin  Diet effective now  Body mass index is 18.79 kg/m.  Consultants:see note  Procedures:see note Microbiology:see note  Medications: Scheduled Meds: . benztropine  1 mg Oral Daily  . folic acid  1 mg Oral Daily  . haloperidol lactate  10 mg Intramuscular BID   Or  . haloperidol  5 mg Oral BID  . megestrol  40 mg Oral BID  . multivitamin with minerals  1 tablet Oral Daily  . risperiDONE microspheres  50 mg Intramuscular Q14 Days  . thiamine   100 mg Oral Daily  . vitamin B-12  1,000 mcg Oral Daily   Continuous Infusions: . lactated ringers 30 mL/hr at 02/09/20 1554    Antimicrobials: Anti-infectives (From admission, onward)   None       Objective: Vitals: Today's Vitals   02/11/20 2200 02/12/20 0551 02/12/20 1618 02/12/20 1945  BP:  (!) 170/99 129/75   Pulse:  (!) 102 (!) 101   Resp:  18 17   Temp:  98.7 F (37.1 C) 99 F (37.2 C)   TempSrc:  Oral Oral   SpO2:  100% 100%   Weight:      Height:      PainSc: 0-No pain   0-No pain    Intake/Output Summary (Last 24 hours) at 02/13/2020 1707 Last data filed at 02/13/2020 1500 Gross per 24 hour  Intake 360 ml  Output --  Net 360 ml   Filed Weights   01/31/20 0440  Weight: 54.4 kg   Weight change:    Intake/Output from previous day: No intake/output data recorded. Intake/Output this shift: Total I/O In: 360 [P.O.:360] Out: -   Examination: Patient completely refuses exam cannot comment  Data Reviewed: I have personally reviewed following labs and imaging studies CBC: Recent Labs  Lab 02/07/20 1050  WBC 7.2  HGB 7.6*  HCT 26.2*  MCV 78.7*  PLT 937*   Basic Metabolic Panel: Recent Labs  Lab 02/07/20 1050  NA 140  K 4.6  CL 105  CO2 25  GLUCOSE 100*  BUN 12  CREATININE 0.86  CALCIUM 8.9   GFR: Estimated Creatinine Clearance: 70.9 mL/min (by C-G formula based on SCr of 0.86 mg/dL). Liver Function Tests: Recent Labs  Lab 02/07/20 1050  AST 13*  ALT 10  ALKPHOS 40  BILITOT 0.5  PROT 6.4*  ALBUMIN 3.3*   No results for input(s): LIPASE, AMYLASE in the last 168 hours. No results for input(s): AMMONIA in the last 168 hours. Coagulation Profile: No results for input(s): INR, PROTIME in the last 168 hours. Cardiac Enzymes: No results for input(s): CKTOTAL, CKMB, CKMBINDEX, TROPONINI in the last 168 hours. BNP (last 3 results) No results for input(s): PROBNP in the last 8760 hours. HbA1C: No results for input(s): HGBA1C in the  last 72 hours. CBG: No results for input(s): GLUCAP in the last 168 hours. Lipid Profile: No results for input(s): CHOL, HDL, LDLCALC, TRIG, CHOLHDL, LDLDIRECT in the last 72 hours. Thyroid Function Tests: No results for input(s): TSH, T4TOTAL, FREET4, T3FREE, THYROIDAB in the last 72 hours. Anemia Panel: No results for input(s): VITAMINB12, FOLATE, FERRITIN, TIBC, IRON, RETICCTPCT in the last 72 hours. Sepsis Labs: No results for input(s): PROCALCITON, LATICACIDVEN in the last 168 hours.  No results found for this or any previous visit (from the past 240 hour(s)).    Radiology Studies: No results found.   LOS: 13 days   Nita Sells, MD [facility]  02/13/2020, 5:07 PM

## 2020-02-13 NOTE — Plan of Care (Signed)
  Problem: Education: Goal: Knowledge of General Education information will improve Description: Including pain rating scale, medication(s)/side effects and non-pharmacologic comfort measures 02/13/2020 2235 by Darrick Huntsman, RN Outcome: Progressing 02/13/2020 2235 by Darrick Huntsman, RN Outcome: Progressing   Problem: Activity: Goal: Risk for activity intolerance will decrease 02/13/2020 2235 by Darrick Huntsman, RN Outcome: Progressing 02/13/2020 2235 by Darrick Huntsman, RN Outcome: Progressing   Problem: Nutrition: Goal: Adequate nutrition will be maintained 02/13/2020 2235 by Darrick Huntsman, RN Outcome: Progressing 02/13/2020 2235 by Darrick Huntsman, RN Outcome: Progressing   Problem: Elimination: Goal: Will not experience complications related to bowel motility 02/13/2020 2235 by Darrick Huntsman, RN Outcome: Progressing 02/13/2020 2235 by Darrick Huntsman, RN Outcome: Progressing Goal: Will not experience complications related to urinary retention 02/13/2020 2235 by Darrick Huntsman, RN Outcome: Progressing 02/13/2020 2235 by Darrick Huntsman, RN Outcome: Progressing   Problem: Pain Managment: Goal: General experience of comfort will improve 02/13/2020 2235 by Darrick Huntsman, RN Outcome: Progressing 02/13/2020 2235 by Darrick Huntsman, RN Outcome: Progressing   Problem: Safety: Goal: Ability to remain free from injury will improve 02/13/2020 2235 by Darrick Huntsman, RN Outcome: Progressing 02/13/2020 2235 by Darrick Huntsman, RN Outcome: Progressing   Problem: Skin Integrity: Goal: Risk for impaired skin integrity will decrease 02/13/2020 2235 by Darrick Huntsman, RN Outcome: Progressing 02/13/2020 2235 by Darrick Huntsman, RN Outcome: Progressing

## 2020-02-14 LAB — COMPREHENSIVE METABOLIC PANEL
ALT: 14 U/L (ref 0–44)
AST: 19 U/L (ref 15–41)
Albumin: 3.9 g/dL (ref 3.5–5.0)
Alkaline Phosphatase: 43 U/L (ref 38–126)
Anion gap: 9 (ref 5–15)
BUN: 15 mg/dL (ref 6–20)
CO2: 24 mmol/L (ref 22–32)
Calcium: 8.9 mg/dL (ref 8.9–10.3)
Chloride: 103 mmol/L (ref 98–111)
Creatinine, Ser: 0.82 mg/dL (ref 0.44–1.00)
GFR calc Af Amer: >60 mL/min (ref >60)
GFR calc non Af Amer: >60 mL/min (ref >60)
Glucose, Bld: 104 mg/dL — ABNORMAL HIGH (ref 70–99)
Potassium: 3.9 mmol/L (ref 3.5–5.1)
Sodium: 136 mmol/L (ref 135–145)
Total Bilirubin: 0.9 mg/dL (ref 0.3–1.2)
Total Protein: 7.6 g/dL (ref 6.5–8.1)

## 2020-02-14 LAB — CBC WITH DIFFERENTIAL/PLATELET
Abs Immature Granulocytes: 0.02 10*3/uL (ref 0.00–0.07)
Basophils Absolute: 0 10*3/uL (ref 0.0–0.1)
Basophils Relative: 0 %
Eosinophils Absolute: 0 10*3/uL (ref 0.0–0.5)
Eosinophils Relative: 0 %
HCT: 29.8 % — ABNORMAL LOW (ref 36.0–46.0)
Hemoglobin: 8.4 g/dL — ABNORMAL LOW (ref 12.0–15.0)
Immature Granulocytes: 0 %
Lymphocytes Relative: 28 %
Lymphs Abs: 1.5 10*3/uL (ref 0.7–4.0)
MCH: 21.9 pg — ABNORMAL LOW (ref 26.0–34.0)
MCHC: 28.2 g/dL — ABNORMAL LOW (ref 30.0–36.0)
MCV: 77.8 fL — ABNORMAL LOW (ref 80.0–100.0)
Monocytes Absolute: 0.3 10*3/uL (ref 0.1–1.0)
Monocytes Relative: 5 %
Neutro Abs: 3.7 10*3/uL (ref 1.7–7.7)
Neutrophils Relative %: 67 %
Platelets: 534 10*3/uL — ABNORMAL HIGH (ref 150–400)
RBC: 3.83 MIL/uL — ABNORMAL LOW (ref 3.87–5.11)
RDW: 28.5 % — ABNORMAL HIGH (ref 11.5–15.5)
WBC: 5.6 10*3/uL (ref 4.0–10.5)
nRBC: 0 % (ref 0.0–0.2)

## 2020-02-14 NOTE — Progress Notes (Signed)
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  ?Living in her car acting paranoid, noncompliant with medication Delusional-thanks husband is "forcing her to apply for disability and that someone is poisoning her food" .  Apparently flight of ideas poor p.o. intake no ADL performance Family also indicated that patient has had large-volume menses for at least 4 days  with large clots but this is a chronic condition for her over at least the last decade if not longer. Hemoglobin previously has been noted low in the 4-5 range. In the ED, patient's hemoglobin noted to be markedly low at 2.9 with remarkably low MCV at 63.5.  Creatinine also elevated at 1.03 consistent with history of poor p.o. intake.  . Patient was subsequently seen by gynecology on 6/6 but patient has been refusing any treatment options and examination.  Psychiatry was also consulted who recommended inpatient psych placement. Patient has been refusing vital checks blood draws and medication. He has been agitated verbally. Patient agreed for blood 6/11 and remains stable.  She is hemodynamically stable. Patient medically stable for discharge to inpatient psych  Subjective: Was more amenable to some care this morning Still quite agitated Slightly more polite without profanity or expletives directed towards examiner, staff Obtain labs today which show very stable hemoglobin of 8  Assessment & Plan:  Acute blood loss anemia on chronic anemia likely secondary to menorrhagia: Received 3 units of PRBC.  Hemoglobin 2.9 g on admission with  tachycardia.  Hemoglobin has improved on repeat labs 6/ 18 and hemoglobin is stable at 8  Schizoaffective disorder/bipolar disorder: 2 separate physicians agreed with plan for forcing meds She is more amenable now I am increasing Haldol to 10 mg twice daily She will need Haldol decanoate depot preparation to help with compliance  DVT prophylaxis: Place and maintain sequential compression device Start: 02/02/20 1143 Place and maintain sequential compression device Start: 02/02/20 1140 Code Status: Full code Family Communication: None present  Status is: Inpatient  Remains inpatient appropriate because:Unsafe d/c plan Dispo: The patient is from: Home/homeless              Anticipated d/c is to: Inpatient psychiatric unit.  Patient has IVC that was redone on 6/14              Anticipated d/c date is: when bed available              Patient currently is medically stable to d/c.  Plan is for discharge to his inpatient psychiatric once accepted.  Diet Order            Diet regular Room service appropriate? Yes; Fluid consistency: Thin  Diet effective now                  Body mass index is 18.79 kg/m.  Consultants:see note  Procedures:see note Microbiology:see note  Medications: Scheduled Meds: . benztropine  1 mg Oral Daily  . folic acid  1 mg Oral Daily  . haloperidol lactate  10 mg Intramuscular BID   Or  . haloperidol  5 mg Oral BID  . megestrol  40 mg Oral BID  .  multivitamin with minerals  1 tablet Oral Daily  . risperiDONE microspheres  50 mg Intramuscular Q14 Days  . thiamine  100 mg Oral Daily  . vitamin B-12  1,000 mcg Oral Daily   Continuous Infusions: . lactated ringers 30 mL/hr at 02/09/20 1554    Antimicrobials: Anti-infectives (From admission, onward)   None       Objective: Vitals: Today's Vitals   02/12/20 0551 02/12/20 1618 02/12/20 1945 02/14/20 0637  BP: (!) 170/99 129/75  107/84  Pulse: (!) 102 (!) 101  (!) 102  Resp: 18 17  16   Temp:  98.7 F (37.1 C) 99 F (37.2 C)  98.7 F (37.1 C)  TempSrc: Oral Oral  Oral  SpO2: 100% 100%  100%  Weight:      Height:      PainSc:   0-No pain     Intake/Output Summary (Last 24 hours) at 02/14/2020 1002 Last data filed at 02/14/2020 0809 Gross per 24 hour  Intake 240 ml  Output --  Net 240 ml   Filed Weights   01/31/20 0440  Weight: 54.4 kg   Weight change:    Intake/Output from previous day: 06/17 0701 - 06/18 0700 In: 360 [P.O.:360] Out: -  Intake/Output this shift: Total I/O In: 120 [P.O.:120] Out: -   Examination: Patient completely refuses exam cannot comment  Data Reviewed: I have personally reviewed following labs and imaging studies CBC: Recent Labs  Lab 02/07/20 1050 02/14/20 0853  WBC 7.2 5.6  NEUTROABS  --  3.7  HGB 7.6* 8.4*  HCT 26.2* 29.8*  MCV 78.7* 77.8*  PLT 582* 665*   Basic Metabolic Panel: Recent Labs  Lab 02/07/20 1050 02/14/20 0853  NA 140 136  K 4.6 3.9  CL 105 103  CO2 25 24  GLUCOSE 100* 104*  BUN 12 15  CREATININE 0.86 0.82  CALCIUM 8.9 8.9   GFR: Estimated Creatinine Clearance: 74.4 mL/min (by C-G formula based on SCr of 0.82 mg/dL). Liver Function Tests: Recent Labs  Lab 02/07/20 1050 02/14/20 0853  AST 13* 19  ALT 10 14  ALKPHOS 40 43  BILITOT 0.5 0.9  PROT 6.4* 7.6  ALBUMIN 3.3* 3.9   No results for input(s): LIPASE, AMYLASE in the last 168 hours. No results for input(s): AMMONIA in the last 168 hours. Coagulation Profile: No results for input(s): INR, PROTIME in the last 168 hours. Cardiac Enzymes: No results for input(s): CKTOTAL, CKMB, CKMBINDEX, TROPONINI in the last 168 hours. BNP (last 3 results) No results for input(s): PROBNP in the last 8760 hours. HbA1C: No results for input(s): HGBA1C in the last 72 hours. CBG: No results for input(s): GLUCAP in the last 168 hours. Lipid Profile: No results for input(s): CHOL, HDL, LDLCALC, TRIG, CHOLHDL, LDLDIRECT in the last 72 hours. Thyroid  Function Tests: No results for input(s): TSH, T4TOTAL, FREET4, T3FREE, THYROIDAB in the last 72 hours. Anemia Panel: No results for input(s): VITAMINB12, FOLATE, FERRITIN, TIBC, IRON, RETICCTPCT in the last 72 hours. Sepsis Labs: No results for input(s): PROCALCITON, LATICACIDVEN in the last 168 hours.  No results found for this or any previous visit (from the past 240 hour(s)).    Radiology Studies: No results found.   LOS: 14 days   Nita Sells, MD [facility]  02/14/2020, 10:02 AM

## 2020-02-14 NOTE — TOC Progression Note (Addendum)
Transition of Care [facility]) - Progression Note    Patient Details  Name: Valerie Howard MRN: [id number] Date of Birth: 03/30/1976  Transition of Care [facility]) CM/SW Contact  [name], RN Phone Number: 02/14/2020, 3:29 PM  Clinical Narrative:     Requested Clinicals faxed [phone] to [facility]. A Call to [facility] [phone] was may, clinicals are being reviewed.       Expected Discharge Plan and Services                                                 Social Determinants of Health (SDOH) Interventions    Readmission Risk Interventions No flowsheet data found.

## 2020-02-14 NOTE — Progress Notes (Signed)
Patient refuse PO medication. Security presence when giving IM haldol.

## 2020-02-14 NOTE — Progress Notes (Signed)
Pt agreed to administration of Haldol 10mg  IM, refused all other medication and nursing assessment. Continues to be verbally aggressive with staff.  Zyler Hyson, Bing Neighbors, RN

## 2020-02-15 DIAGNOSIS — R404 Transient alteration of awareness: Secondary | ICD-10-CM

## 2020-02-15 NOTE — Progress Notes (Signed)
Seems calmer?  Some progress (I was not routinely and roundly cursed out this morning although nursing was apparently) She continues to refuse however my exam or to engage with me despite a very calm and soft tone that I took with her We will not charge her for this visit as we are not able to really assess her  I have discussed with nursing staff plan of care which is to continue IM Haldol which she is amenable to  We will continue to see her until she can be placed at an inpatient psychiatric facility Psychiatry to reevaluate early next week plans for disposition as quite frankly we are doing ABSOLUTELY NOTHING for her at this time in the inpatient medical arena  Verneita Griffes, MD [facility] 10:46 AM

## 2020-02-15 NOTE — TOC Progression Note (Signed)
Transition of Care [facility]) - Progression Note    Patient Details  Name: Valerie Howard MRN: [id number] Date of Birth: 07-Apr-1976  Transition of Care [facility]) CM/SW [place], LCSW Phone Number: 02/15/2020, 3:11 PM  Clinical Narrative:    CSW reached to [facility] and confirm the patient is still on the wait list.    Expected Discharge Plan: Psychiatric Hospital Barriers to Discharge: Psych Bed not available  Expected Discharge Plan and Services Expected Discharge Plan: [facility]                                               Social Determinants of Health (SDOH) Interventions    Readmission Risk Interventions No flowsheet data found.

## 2020-02-16 NOTE — Progress Notes (Signed)
Patient more engaging today allowed me to examine her but did not talk to me and was talking to CNA throughout my exam  O/e BP 122/81 (BP Location: Right Arm)   Pulse 90   Temp 98.9 F (37.2 C) (Oral)   Resp 16   Ht 5\' 7"  (1.702 m)   Wt 54.4 kg   LMP 01/28/2020 (Approximate)   SpO2 100%   BMI 18.79 kg/m   her no focal deficits chest clear no added sound no rales no rhonchi Abdomen soft nontender no rebound or guarding No lower extremity edema Neurologically intact Still has flight of ideas  P Await psychiatric placement

## 2020-02-16 NOTE — Plan of Care (Signed)

## 2020-02-17 ENCOUNTER — Encounter (HOSPITAL_COMMUNITY): Payer: Self-pay | Admitting: Internal Medicine

## 2020-02-17 NOTE — Plan of Care (Signed)

## 2020-02-17 NOTE — TOC Progression Note (Signed)
Transition of Care [facility]) - Progression Note    Patient Details  Name: Valerie Howard MRN: [id number] Date of Birth: [birth year]  Transition [facility] Care [facility]) CM/SW Contact  Joaquin Courts, RN Phone Number: 02/17/2020, 9:45 AM  Clinical Narrative:   CM spoke with admissions at [facility] who report patient remains on the waiting list but no beds are available at this time.  TOC staff will continue to follow-up for bed availability.     Expected Discharge Plan: Psychiatric Hospital Barriers to Discharge: Psych Bed not available  Expected Discharge Plan and Services Expected Discharge Plan: [facility]                                               Social Determinants of Health (SDOH) Interventions    Readmission Risk Interventions No flowsheet data found.
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
## 2020-02-17 NOTE — Consult Note (Signed)
  Valerie Howard, 44 y.o., female patient seen face to face by this provider, consulted with Dr. Dwyane Dee; and chart reviewed on 02/17/20.  On evaluation Valerie Howard states that she doesn't like Valerie Howard (a NP with [facility]) "I just don't like her and don't want to talk to her."  States that she is fine and that she is not having any problems.  States she was willing to speak with this provider until I asked who she lived with and about her support system "That is personal and non of your business.  I don't want to talk to you anymore; you can leave."  Patient seen corresponding appropriately with staff, walking the hall for exercise.  Patient reporting that she is sleep/eating without difficulty, and tolerating medications.   During evaluation Valerie Howard is alert/oriented x 4; calm/cooperative; and mood is congruent with affect.  She does not appear to be responding to internal/external stimuli or delusional thoughts.  Patient denies suicidal/self-harm/homicidal ideation, psychosis, and paranoia.  Patient answered question appropriately.     Disposition:  Psychiatrically cleared to follow up with current outpatient psychiatric provider No evidence of imminent risk to self or others at present.   Patient does not meet criteria for psychiatric inpatient admission. Supportive therapy provided about ongoing stressors. Discussed crisis plan, support from social network, calling 911, coming to the Emergency Department, and calling Suicide Hotline.    Sent messaged to Dr. Verlon Au:  Patient has been psychiatrically cleared to follow up with current psychiatric provider; and can continue current psychotropic medications.

## 2020-02-18 MED ORDER — THIAMINE HCL 100 MG PO TABS: 100.0000 mg | ORAL_TABLET | Freq: Every day | ORAL | 11 refills | Status: DC

## 2020-02-18 MED ORDER — ALUM & MAG HYDROXIDE-SIMETH 200-200-20 MG/5ML PO SUSP
15.0000 mL | Freq: Once | ORAL | Status: AC
  Administered 2020-02-18: 15 mL via ORAL
  Filled 2020-02-18: qty 30

## 2020-02-18 MED ORDER — HALOPERIDOL DECANOATE 100 MG/ML IM SOLN: 100.0000 mg | Freq: Once | INTRAMUSCULAR | Status: DC

## 2020-02-18 MED ORDER — MEGESTROL ACETATE 40 MG PO TABS: 40.0000 mg | ORAL_TABLET | Freq: Two times a day (BID) | ORAL | 3 refills | Status: DC

## 2020-02-18 NOTE — TOC Progression Note (Signed)
Transition of Care [facility]) - Progression Note    Patient Details  Name: AKIRA PERUSSE MRN: [id number] Date of Birth: Apr 21, 1976  Transition of Care [facility]) CM/SW Contact  Joaquin Courts, RN Phone Number: 02/18/2020, 9:19 AM  Clinical Narrative:  [facility] at [facility] time.      Expected Discharge Plan: Psychiatric Hospital Barriers to Discharge: Psych Bed not available  Expected Discharge Plan and Services Expected Discharge Plan: [facility]                                               Social Determinants of Health (SDOH) Interventions    Readmission Risk Interventions No flowsheet data found.
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
## 2020-02-18 NOTE — TOC Progression Note (Signed)
Transition of Care [facility]) - Progression Note    Patient Details  Name: Valerie Howard MRN: [id number] Date of Birth: 09-12-1975  Transition of Care [facility]) CM/SW Contact  Joaquin Courts, RN Phone Number: 02/18/2020, 4:34 PM  Clinical Narrative:    CM made referrals to [facility] team which assists with transitional housing. Patient has had this service in the past and CM has requested for this to be reinstated.  Additionally, CM faxed a referral for an ACTT team.  At this time patient is not interested in shelter options.  CM has exhausted all reasonable resources available.  No housing arrangement is available to patient at this time.     Expected Discharge Plan: Home/Self Care Barriers to Discharge: Psych Bed not available  Expected Discharge Plan and Services Expected Discharge Plan: Home/Self Care                                               Social Determinants of Health (SDOH) Interventions    Readmission Risk Interventions No flowsheet data found.

## 2020-02-18 NOTE — Progress Notes (Signed)
Nutrition Brief Note RD working remotely.   Patient identified for LOS (day #18)  Wt Readings from Last 15 Encounters:  02/16/20 62.2 kg  10/02/19 59 kg  08/06/19 67.1 kg  09/05/18 79.4 kg  07/17/17 101.3 kg  07/12/17 96.2 kg  03/07/17 80.5 kg  12/05/11 78.6 kg    Body mass index is 21.48 kg/m. Patient meets criteria for normal weight based on current BMI.   Current diet order is Regular, patient is consuming mainly 100% at meals at this time. Labs and medications reviewed.   No nutrition interventions warranted at this time. If nutrition issues arise, please consult RD.   Notes indicate that patient is medically stable and pending discharge to inpatient psych.    Jarome Matin, MS, RD, LDN, CNSC Inpatient Clinical Dietitian RD pager # available in [place]  After hours/weekend pager # available in [facility]

## 2020-02-18 NOTE — Discharge Summary (Signed)
Physician Discharge Summary  Valerie Howard YQM:[id number] DOB: 02/05/1976 DOA: 01/31/2020  PCP: Patient, No Pcp Per  Admit date: 01/31/2020 Discharge date: 02/18/2020  Time spent: 45 minutes  Recommendations for Outpatient Follow-up:  1. 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  ?Living in her car acting paranoid, noncompliant with medication Delusional-thanks husband is "forcing her to apply for disability and that someone is poisoning her food" .  Apparently flight of ideas poor p.o. intake no ADL performance Family also indicated that patient has had large-volume menses for at least 4 days with large clots but this is a chronic condition for her over at least the last decade if not longer. Hemoglobin previously has been noted low in the 4-5 range. In the ED, patient's hemoglobin noted to be markedly low at 2.9 with remarkably low MCV at 63.5.  Creatinine also elevated at 1.03 consistent with history of poor p.o. intake.  .Patient was subsequently seen by gynecology on 6/5 but patient has been refusing any durable treatment option but was amenable at some point to Megace 40 twice dailypsychiatry was also consulted who recommended inpatient psych placement.Patient has been refusing vital checks blood draws and medication. He has been agitated verbally. Patient agreed for blood 6/11 and remains stable.  She is hemodynamically  stable. Patient medically stable for discharge to inpatient psych  Hospital Course:  Acute blood loss anemia on chronic anemia likely secondary to menorrhagia: Received 3 units of PRBC.  Hemoglobin 2.9 g on admission with tachycardia.  Hemoglobin has improved on repeat labs 6/ 18 and hemoglobin is stable at 8.4 She is refusing all oral medications I discussed her case with OB/GYN Dr. Harolyn Rutherford on 6/22 and she feels that Megace can be continued indefinitely for prevention of further bleeding if she is willing to take it   Schizoaffective disorder/bipolar disorder: 2 separate physicians agreed with plan for forcing meds initially Subsequently she was revisited by psychiatry and it was felt that she was less volatile, less combative and more coherent and was cleared by psychiatry services after my discussion with Dr. Hampton Abbot for discharge She has however declined to take Haldol Decanoate or any of her psychotropic medications therefore none will be prescribed to her on discharge She did receive Risperdal const earlier in admission  Severe psychosocial stressors and homelessness See below note as per nurse case manager Ms. Torres from 6/21 and the difficulties to reasonably stabilized and place the patient in a safe discharge environment "patient at bedside regarding living situation.  Patient reports her ex-husband put her out and she was staying in his car.  Patient reports she has no friends or family who would take her in.  Reports she is not interested in shelters and wants CM to speak with [facility] social worker about housing but does not know her name or have any contact phone numbers.  Patient did provide CM with contact for her ex husband Jeneen Rinks and daughter Daleen Bo.  CM spoke with Jeneen Rinks  over the phone and had an extensive conversation.  Jeneen Rinks reports he has been dealing this situation for a long time, but patient cannot return to the home or the vehicle.  Jeneen Rinks reports patient became irrational  and was not eating or drinking and not maintaining hygiene, patient refused to leave the car and was living out of the vehicle for about four months or possibly longer as he remembers that she was out there while it was still cold.  Jeneen Rinks reports that it became unsafe for patient to be around the home.  There are still teenage children in the home and patient has been tormenting them and Jeneen Rinks cannot take the risk of her being on the property.  Ex husband reports that patient has a long history of not taking her medications.  Patient had lived in an apartment prior to returning to Jeneen Rinks' property but she had destroyed the apartment and punched out windows and had to be removed.  Jeneen Rinks did provide phone number for the patient's father but stated that that would not be a viable option for housing either.  CM did call the father Jori Moll) but he did not answer and the phone did not have an option to leave a VM.  Patient is declining shelters and it this time CM does not have any housing options. ?  At this point there is no recourse for the patient other than to discharge from the hospital and hopefully find shelter--I mentioned this topic to her on 6/22 and she needs time as it was around 5 PM when we had this discussion-she will make some telephone calls to see what she can arrange but medically she is stable for discharge, unfortunately her psychopathology precludes further options as per above   Consultations:  [facility]  Psychiatry  Discharge Exam: Vitals:   02/18/20 0614 02/18/20 1242  BP: 126/81 120/72  Pulse: 97 83  Resp: 16 18  Temp: 98.4 F (36.9 C) 98 F (36.7 C)  SpO2: 100% 100%    General: Awake alert more amenable less combative no icterus no pallor Neck soft supple Cardiovascular: S1-S2 no murmur rub or gallop Respiratory: Clinically clear no added sound Quite tangential at times but much clear seems to be more logic based-note that she still has multiple  notes at the bedside with scribbling's derived from the Bible  Discharge Instructions   Discharge Instructions    Diet - low sodium heart healthy   Complete by: As directed    Discharge instructions   Complete by: As directed    [facility] your [facility] I would recommend that you continue the Megace which is a medication to prevent this twice a day You should also continue your other medications such as iron etc. Please present yourself to psychiatric services for further management of your illnesses   Increase activity slowly   Complete by: As directed      Allergies as of 02/18/2020      Reactions   Penicillins Hives, Itching   Has patient had a PCN reaction causing immediate rash, facial/tongue/throat swelling, SOB or lightheadedness with hypotension:Patient refuses to answer (PRA) Has patient had a PCN reaction causing severe rash involving mucus membranes or skin necrosis:PRA Has patient had a PCN reaction that required hospitalization PRA Has patient had a PCN reaction occurring within the last 10 years:PRA If all of the above answers are "NO", then may proceed with Cephalosporin use.  Medication List    TAKE these medications   IRON PO Take 1 tablet by mouth daily.   megestrol 40 MG tablet Commonly known as: MEGACE Take 1 tablet (40 mg total) by mouth 2 (two) times daily.   thiamine 100 MG tablet Take 1 tablet (100 mg total) by mouth daily. Start taking on: February 19, 2020      Allergies  Allergen Reactions  . Penicillins Hives and Itching    Has patient had a PCN reaction causing immediate rash, facial/tongue/throat swelling, SOB or lightheadedness with hypotension:Patient refuses to answer (PRA) Has patient had a PCN reaction causing severe rash involving mucus membranes or skin necrosis:PRA Has patient had a PCN reaction that required hospitalization PRA Has patient had a PCN reaction occurring within the last  10 years:PRA If all of the above answers are "NO", then may proceed with Cephalosporin use.     Follow-up [place] [facility]. Call in 1 week(s).   Specialty: Obstetrics and Gynecology Contact information: [place] [phone]               The results of significant diagnostics from this hospitalization (including imaging, microbiology, ancillary and laboratory) are listed below for reference.    Significant Diagnostic Studies: DG Chest Port 1 View  Result Date: 01/31/2020 CLINICAL DATA:  Altered mental status EXAM: PORTABLE CHEST 1 VIEW COMPARISON:  None. FINDINGS: The heart size and mediastinal contours are within normal limits. Both lungs are clear. The visualized skeletal structures are unremarkable. IMPRESSION: No active disease. Electronically Signed   By: Prudencio Pair M.D.   On: 01/31/2020 04:34   US PELVIC COMPLETE WITH TRANSVAGINAL  Result Date: 02/01/2020 CLINICAL DATA:  Patient with menorrhagia. EXAM: TRANSABDOMINAL AND TRANSVAGINAL ULTRASOUND OF PELVIS TECHNIQUE: Both transabdominal and transvaginal ultrasound examinations of the pelvis were performed. Transabdominal technique was performed for global imaging of the pelvis including uterus, ovaries, adnexal regions, and pelvic cul-de-sac. It was necessary to proceed with endovaginal exam following the transabdominal exam to visualize the endometrium. COMPARISON:  None FINDINGS: Uterus Measurements: 13.6 x 9.2 x 12.8 cm = volume: 838.1 mL. Multiple fibroids are demonstrated. There is a 6.2 x 7.3 x 7.0 cm heterogeneous fibroid within the lower left uterine segment. There is a 4.1 x 4.6 x 4.0 cm fibroid within the posterior uterine body. There is a 3.5 x 3.0 x 2.7 cm fibroid within the anterior uterine body. Multiple additional uterine fibroids are demonstrated. Endometrium Not well evaluated secondary to multiple uterine  fibroids. Right ovary Not visualized. Left ovary Not visualized. Other findings No abnormal free fluid. IMPRESSION: Multiple uterine fibroids are demonstrated. The endometrium is not able to be assessed or well visualized secondary to multiple uterine fibroids. Electronically Signed   By: Lovey Newcomer M.D.   On: 02/01/2020 15:55    Microbiology: No results found for this or any previous visit (from the past 240 hour(s)).   Labs: Basic Metabolic Panel: Recent Labs  Lab 02/14/20 0853  NA 136  K 3.9  CL 103  CO2 24  GLUCOSE 104*  BUN 15  CREATININE 0.82  CALCIUM 8.9   Liver Function Tests: Recent Labs  Lab 02/14/20 0853  AST 19  ALT 14  ALKPHOS 43  BILITOT 0.9  PROT 7.6  ALBUMIN 3.9   No results for input(s): LIPASE, AMYLASE in the last 168 hours. No results for input(s): AMMONIA in the last 168 hours.  CBC: Recent Labs  Lab 02/14/20 0853  WBC 5.6  NEUTROABS 3.7  HGB 8.4*  HCT 29.8*  MCV 77.8*  PLT 534*   Cardiac Enzymes: No results for input(s): CKTOTAL, CKMB, CKMBINDEX, TROPONINI in the last 168 hours. BNP: BNP (last 3 results) No results for input(s): BNP in the last 8760 hours.  ProBNP (last 3 results) No results for input(s): PROBNP in the last 8760 hours.  CBG: No results for input(s): GLUCAP in the last 168 hours.     Signed:  Nita Sells MD   [facility] 02/18/2020, 5:58 PM

## 2020-02-19 MED ORDER — HYDROGEN PEROXIDE 3 % EX SOLN
CUTANEOUS | Status: AC
  Filled 2020-02-19: qty 473

## 2020-02-19 NOTE — Progress Notes (Signed)
Pt seems very uncomfortable and anxious/agitated. Believes we are trying to force her into the Health Care field and she doesn't want to be in it. She refused all of her meds and thinks I am an Wellsite geologist. Pleasant other than that. She says she" doesn't want anything from [place].and No thank you"

## 2020-02-19 NOTE — Progress Notes (Signed)
PROGRESS NOTE    Valerie Howard    Code Status: Full Code  SJG:[id number] DOB: 02-23-1976 DOA: 01/31/2020 LOS: 19 days  PCP: Patient, No Pcp Per CC:  Chief Complaint  Patient presents with  . [facility] Summary   44 y.o.BFschizophrenia, paranoid behavioral tendencies, chronic anemia due to blood loss presented to the ED with behavioral health disturbances. Living in her car acting paranoid, noncompliant with medication Delusional-thanks husband is "forcing her to apply for disability and that someone is poisoning her food" Apparently flight of ideas poor p.o. intake no ADL. Family also indicated that patient has had large-volume menses for at least 4 days with large clots but this is a chronic condition for her over at least the last decade if not longer. Hemoglobin previously has been noted low in the 4-5 range. In the ED, patient's hemoglobin noted to be markedly low at 2.9 with remarkably low MCV at 63.5.  Creatinine also elevated at 1.03 consistent with history of poor p.o. intake. Patient was subsequently seen by gynecology on 6/5 but patient has been refusing any durable treatment option but was amenable at some point to Megace 40 twice dailypsychiatry was also consulted who initially recommended inpatient psych placement. Patient agreed for blood on 6/11.  She was reevaluated by gynecology on 6/12 who felt Megace could be continued indefinitely to prevent further bleeding if she is willing to take it.  She was reevaluated by psychiatry on 6/21 who felt the patient did not need inpatient psychiatric care.  Currently without a safe discharge plan.   A & P   Active Problems:   Anemia, iron deficiency   Schizoaffective disorder, bipolar type (HCC)   Menorrhagia   Acute blood loss anemia   Paranoia (psychosis) ([place])   1. Acute on chronic anemia secondary to blood loss from menorrhagia s/p 3 units PRBCs a. Hb 2.9 on admission b. Stable after  transfusions c. Patient reported recurrence of menorrhagia today -will check CBC in a.m. d. Has not had lab work since 6/18 e. Prior hospitalist discussed with OB/GYN on 6/22 who felt Megace could be continued indefinitely for prevention of further bleeding if she was willing to take it  2. Schizoaffective disorder/bipolar disorder a. Initially 2 separate physicians agreed with plan for forcing medications. b. Initially recommended for inpatient psychiatry however she was revisited by psychiatry c. Continues to decline her psychiatric medications and so none will be prescribed at discharge d. Did receive Risperdal early in admission e. will ask psychiatry to reevaluate the patient in the a.m.  3. Severe psychosocial stressors and homelessness a. See Dr. Arlyss Queen note and case manager Ms. Torres note from 6/21   DVT prophylaxis: Place and maintain sequential compression device Start: 02/02/20 1143 Place and maintain sequential compression device Start: 02/02/20 1140   Family Communication: No family at bedside  Disposition Plan: seems to be an unsafe discharge plan for this patient despite the hospitalist discussion with the patient yesterday and initially planned for discharge yesterday. She is at high risk of rehospitalization due to poor compliance with her medications.  Tenuous psychiatric clinical status today since she is not taking her medications now with continuous/recurrent? Delusions and recurrent menorrhagia without follow-up CBCs (has probably been refusing).  We will continue to observe overnight and repeat labs in the morning.  Will need to have a thorough discussion with social work tomorrow to ensure a safe discharge plan for this patient.   Status is:  Inpatient  Remains inpatient appropriate because:Unsafe d/c plan   Dispo: The patient is from: Home              Anticipated d/c is to: Home              Anticipated d/c date is: 1 day              Patient currently is  not medically stable to d/c.          Pressure injury documentation    None  Consultants  Psychiatry OB GYN  Procedures  None  Antibiotics   Anti-infectives (From admission, onward)   None        Subjective   One-to-one sitter at bedside.  Patient states she had recurrence of menorrhagia this a.m. just prior to my arrival.  She is a poor historian without any other complaints.  I discussed the patient's history with the social worker this a.m.  This afternoon per nursing note the patient seemed very uncomfortable and anxious/agitated and believe that we are trying to force her into the healthcare field though she does not want to be in it.  Refused all of her medications today and thought nurse was an Wellsite geologist.  Discussion with the charge nurse as well today regarding the patient's dispo.  Objective   Vitals:   02/19/20 0044 02/19/20 0045 02/19/20 0552 02/19/20 0554  BP: 116/68 116/68 131/77 131/77  Pulse: 91 91 99 99  Resp: 18 18 16 16   Temp: 98.6 F (37 C) 98.6 F (37 C) 98.5 F (36.9 C) 98.5 F (36.9 C)  TempSrc: Oral Oral Oral Oral  SpO2: 100% 100% 100% 100%  Weight:      Height:        Intake/Output Summary (Last 24 hours) at 02/19/2020 1743 Last data filed at 02/19/2020 1209 Gross per 24 hour  Intake 1874.5 ml  Output 0 ml  Net 1874.5 ml   Filed Weights   01/31/20 0440 02/16/20 1831  Weight: 54.4 kg 62.2 kg    Examination:  Physical Exam Vitals and nursing note reviewed.  Constitutional:      Appearance: Normal appearance.  HENT:     Head: Normocephalic and atraumatic.  Eyes:     Conjunctiva/sclera: Conjunctivae normal.  Cardiovascular:     Rate and Rhythm: Normal rate and regular rhythm.  Pulmonary:     Effort: Pulmonary effort is normal.     Breath sounds: Normal breath sounds.  Abdominal:     General: Abdomen is flat.     Palpations: Abdomen is soft.  Musculoskeletal:        General: No swelling or  tenderness.  Skin:    Coloration: Skin is not jaundiced or pale.  Neurological:     Mental Status: She is alert. Mental status is at baseline.     Data Reviewed: I have personally reviewed following labs and imaging studies  CBC: Recent Labs  Lab 02/14/20 0853  WBC 5.6  NEUTROABS 3.7  HGB 8.4*  HCT 29.8*  MCV 77.8*  PLT 951*   Basic Metabolic Panel: Recent Labs  Lab 02/14/20 0853  NA 136  K 3.9  CL 103  CO2 24  GLUCOSE 104*  BUN 15  CREATININE 0.82  CALCIUM 8.9   GFR: Estimated Creatinine Clearance: 84.3 mL/min (by C-G formula based on SCr of 0.82 mg/dL). Liver Function Tests: Recent Labs  Lab 02/14/20 0853  AST 19  ALT 14  ALKPHOS 43  BILITOT 0.9  PROT 7.6  ALBUMIN 3.9   No results for input(s): LIPASE, AMYLASE in the last 168 hours. No results for input(s): AMMONIA in the last 168 hours. Coagulation Profile: No results for input(s): INR, PROTIME in the last 168 hours. Cardiac Enzymes: No results for input(s): CKTOTAL, CKMB, CKMBINDEX, TROPONINI in the last 168 hours. BNP (last 3 results) No results for input(s): PROBNP in the last 8760 hours. HbA1C: No results for input(s): HGBA1C in the last 72 hours. CBG: No results for input(s): GLUCAP in the last 168 hours. Lipid Profile: No results for input(s): CHOL, HDL, LDLCALC, TRIG, CHOLHDL, LDLDIRECT in the last 72 hours. Thyroid Function Tests: No results for input(s): TSH, T4TOTAL, FREET4, T3FREE, THYROIDAB in the last 72 hours. Anemia Panel: No results for input(s): VITAMINB12, FOLATE, FERRITIN, TIBC, IRON, RETICCTPCT in the last 72 hours. Sepsis Labs: No results for input(s): PROCALCITON, LATICACIDVEN in the last 168 hours.  No results found for this or any previous visit (from the past 240 hour(s)).       Radiology Studies: No results found.      Scheduled Meds: . hydrogen peroxide      . benztropine  1 mg Oral Daily  . folic acid  1 mg Oral Daily  . haloperidol lactate  10 mg  Intramuscular BID   Or  . haloperidol  5 mg Oral BID  . megestrol  40 mg Oral BID  . multivitamin with minerals  1 tablet Oral Daily  . risperiDONE microspheres  50 mg Intramuscular Q14 Days  . thiamine  100 mg Oral Daily  . vitamin B-12  1,000 mcg Oral Daily   Continuous Infusions: . lactated ringers 30 mL/hr at 02/09/20 1554     Time spent: 35 minutes with over 50% of the time coordinating the patient's care    Harold Hedge, DO [facility] Pager [phone]  Call night coverage person covering after 7pm

## 2020-02-19 NOTE — Progress Notes (Signed)
Pt stated "the people inside of me are squeezing all my blood out" Also says "her brother in law Tye Savoy is stocking her and she will not have it."

## 2020-02-19 NOTE — TOC Progression Note (Signed)
Transition of Care [facility]) - Progression Note    Patient Details  Name: Valerie Howard MRN: [id number] Date of Birth: 12/27/1975  Transition [facility] CM/SW Contact  Leeroy Cha, RN Phone Number: 02/19/2020, 11:31 AM  Clinical Narrative:    [facility]   Expected Discharge Plan: Home/Self Care Barriers to Discharge: Psych Bed not available  Expected Discharge Plan and Services Expected Discharge Plan: Home/Self Care         Expected Discharge Date: 02/19/20                                     Social Determinants of Health (SDOH) Interventions    Readmission Risk Interventions No flowsheet data found.

## 2020-02-20 LAB — CBC
HCT: 26.6 % — ABNORMAL LOW (ref 36.0–46.0)
Hemoglobin: 7.2 g/dL — ABNORMAL LOW (ref 12.0–15.0)
MCH: 21.7 pg — ABNORMAL LOW (ref 26.0–34.0)
MCHC: 27.1 g/dL — ABNORMAL LOW (ref 30.0–36.0)
MCV: 80.1 fL (ref 80.0–100.0)
Platelets: 300 10*3/uL (ref 150–400)
RBC: 3.32 MIL/uL — ABNORMAL LOW (ref 3.87–5.11)
RDW: 28.1 % — ABNORMAL HIGH (ref 11.5–15.5)
WBC: 6.1 10*3/uL (ref 4.0–10.5)
nRBC: 0 % (ref 0.0–0.2)

## 2020-02-20 LAB — BASIC METABOLIC PANEL
Anion gap: 6 (ref 5–15)
BUN: 16 mg/dL (ref 6–20)
CO2: 24 mmol/L (ref 22–32)
Calcium: 8.7 mg/dL — ABNORMAL LOW (ref 8.9–10.3)
Chloride: 106 mmol/L (ref 98–111)
Creatinine, Ser: 0.77 mg/dL (ref 0.44–1.00)
GFR calc Af Amer: >60 mL/min (ref >60)
GFR calc non Af Amer: >60 mL/min (ref >60)
Glucose, Bld: 113 mg/dL — ABNORMAL HIGH (ref 70–99)
Potassium: 4.3 mmol/L (ref 3.5–5.1)
Sodium: 136 mmol/L (ref 135–145)

## 2020-02-20 LAB — PREPARE RBC (CROSSMATCH)

## 2020-02-20 LAB — HEMOGLOBIN AND HEMATOCRIT, BLOOD
HCT: 24 % — ABNORMAL LOW (ref 36.0–46.0)
Hemoglobin: 6.7 g/dL — CL (ref 12.0–15.0)

## 2020-02-20 MED ORDER — SODIUM CHLORIDE 0.9% IV SOLUTION: Freq: Once | INTRAVENOUS | Status: DC

## 2020-02-20 NOTE — TOC Transition Note (Addendum)
Transition of Care [facility]) - CM/SW Discharge Note   Patient Details  Name: Valerie Howard MRN: [id number] Date of Birth: Sep 06, 1975  [facility] Care [facility]) CM/SW Contact:  Leeroy Cha, RN Phone Number: 02/20/2020, 1:22 PM   Clinical Narrative:    Patient dcd cleared by psych earlier today, no pysch beds available, pt is stable and is aware of decisions. Bus pass given per pt request at dc. Patient weanted to speak to sw about housing.  Per previous notes patient has refused all resources for housing.  Pastient states that she has used [facility] in the past but does not wanted this time.  Gave a web siite. Social serve that does exist but the patient will need to apply with her financial information.  This is section 8 housing site.  States that she has spoke with a Pamala Hurry [place] at [facility] about this.  But when was it that she spoke to [facility] she states that she has not called her.  This cm does not know of a Cory Munch at [facility].  She is still refuing anything that is lower class (in her words) or section 8.housing. Has refused all other shelter offerings.  No reasonable housing options available at this time.  Final next level of care: Home/Self Care Barriers to Discharge: Barriers Resolved   Patient Goals and CMS Choice        Discharge Placement                       Discharge Plan and Services                                     Social Determinants of Health (SDOH) Interventions     Readmission Risk Interventions No flowsheet data found.

## 2020-02-20 NOTE — Progress Notes (Deleted)
Patient seen and examined at bedside no acute distress and resting comfortably.  No events overnight.  Tolerating diet. She is without any delusions/paranoia today.   Denies any chest pain, shortness of breath, fever, nausea, vomiting. Reports that she is on day 2 of her period. Otherwise ROS negative  Physical Exam Vitals and nursing note reviewed.  Constitutional:      Appearance: Normal appearance.  HENT:     Head: Normocephalic and atraumatic.  Eyes:     Conjunctiva/sclera: Conjunctivae normal.  Cardiovascular:     Rate and Rhythm: Normal rate and regular rhythm.  Pulmonary:     Effort: Pulmonary effort is normal.     Breath sounds: Normal breath sounds.  Abdominal:     General: Abdomen is flat.     Palpations: Abdomen is soft.  Musculoskeletal:        General: No swelling or tenderness.  Skin:    Coloration: Skin is not jaundiced or pale.  Neurological:     Mental Status: She is alert. Mental status is at baseline.  Psychiatric:        Mood and Affect: Mood normal.        Behavior: Behavior normal.    A/P  Patient Active Problem List   Diagnosis Date Noted  . Paranoia (psychosis) ([place])   . Acute blood loss anemia 02/27/2019  . 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 Schizoaffective disorder, bipolar type ([place])   . Menorrhagia 05/12/2014  . Personality disorder ([place]) 10/17/2012  . Anemia, iron deficiency 10/13/2012  . Tachycardia 10/01/2012    Unfortunately she is a high risk for readmission given her medical noncompliance but has refused any placement. She has been cleared by psych and does not need inpatient psychiatric hospitalization. She will need follow up with OB/GYN for her menorrhagia and uterine fibroids, however she is at risk for not following up.   Hb remains at 7.2 today which appears to be at/near her baseline.   Otherwise, No changes per Dr. Arlyss Queen Discharge Summary from 02/18/20. Please refer to his note for full details regarding her hospitalization  Time  spent: 15 minutes with over 50% of the time coordinating the patient's care  Marva Panda, DO [facility]

## 2020-02-20 NOTE — Progress Notes (Signed)
CRITICAL VALUE ALERT  Critical Value: Hgb 6.7  Date & Time Notied: 6/24  @1445   Provider Notified: Dr Neysa Bonito aware on rounds  Orders Received/Actions taken: See new MD orders

## 2020-02-20 NOTE — Consult Note (Signed)
°  Valerie Howard, 44 y.o., female patient seen face to face by this provider, consulted with Dr. Dwyane Dee; and chart reviewed on 02/20/20.  On evaluation Valerie Howard she states "who the fuck are you? I dont need yall services. Just fuck off. Tell Valerie Howard and Valerie Howard I said hi. Tell them Valerie Howard said HI, but not in a flirtous way. Im not flirting with them just tell them I said hey. You fuck off and fuck Valerie Howard too. Nurse, mental health made several attempts to connect with patient and she remained guarded, verbally aggressive with Probation officer, and continued to excuse me out of the room. Patient would not engage with Probation officer at all. She is alert and oriented, calm and uncooperative. Her mood is irritable and angry, and her affect is congruent and labile. Upon entering the room patient was sitting up in bed eating and watching Television. Writer spoke to her in a friendly manner, and patient states "you said that like I know you. Who are you? Maybe if you take your mask down I can see your face. But I dont know you. What the fuck you want?"   Patient appears to be having normal thought processes, although she is declining several services and medications. She is able to accurately recall staff names that work at [facility], and answers questions appropriately albeit with vulgar ness and obscenities she answers. She does not appear to be responding to internal stimuli, external stimuli and does not appear to be preoccupied. She does not appear to be psychotic, grandiose, or delusional at this time. The behaviors she is exhibiting are opposition, defiance, easily agitated, chronically irritable. She remains irritable poorly cooperative but no longer appears paranoid or delusional. 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 She does have a diagnosis of schizophrneia however this is not presenting at this time. She has always refused medications despite knowing the benefits of the medications, and continues to have no real  insight regarding her mental illness. She has had a forced medication consult this admission, and without treatment she will continue with these behaviors. She should be offered oral meds first and then if she declines or refuses oral medications may offer IM medications. Throughout the admission she has continuous refutes suicidal ideations, homicidal ideations and or hallucinations.  Will recommend working closely with social work to arrange housing and continue with current psych medications. Patient with multiple hospital admissions, chronic impulsivity, chronic mood lability, chronic hostility, and agitation that responds well to Haldol oral and LAI Haldol.   Will psych clear patient at this time.    Disposition:  Psychiatrically cleared to follow up with current outpatient psychiatric provider No evidence of imminent risk to self or others at present.   Patient does not meet criteria for psychiatric inpatient admission. Supportive therapy provided about ongoing stressors. Discussed crisis plan, support from social network, calling 911, coming to the Emergency Department, and calling Suicide Hotline.

## 2020-02-20 NOTE — TOC Progression Note (Signed)
Transition of Care [facility]) - Progression Note    Patient Details  Name: Valerie Howard MRN: [id number] Date of Birth: 24-Oct-1975  Transition of Care [facility] CM/SW Contact  Leeroy Cha, RN Phone Number: 02/20/2020, 12:22 PM  Clinical Narrative:    Pt is no longer ivc'd and is ready for discharge.  Has refused all housing resources and other resources for help.  [facility] notified of this.     Expected Discharge Plan: Home/Self Care Barriers to Discharge: Psych Bed not available  Expected Discharge Plan and Services Expected Discharge Plan: Home/Self Care         Expected Discharge Date: 02/19/20                                     Social Determinants of Health (SDOH) Interventions    Readmission Risk Interventions No flowsheet data found.

## 2020-02-20 NOTE — Progress Notes (Signed)
PROGRESS NOTE    Valerie Howard    Code Status: Full Code  JSE:[id number] DOB: 1976-03-21 DOA: 01/31/2020 LOS: 20 days  PCP: Patient, No Pcp Per CC:  Chief Complaint  Patient presents with  . 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 Living in her car acting paranoid, noncompliant with medication Delusional-thanks husband is "forcing her to apply for disability and that someone is poisoning her food" Apparently flight of ideas poor p.o. intake no ADL. Family also indicated that patient has had large-volume menses for at least 4 days with large clots but this is a chronic condition for her over at least the last decade if not longer. Hemoglobin previously has been noted low in the 4-5 range. In the ED, patient's hemoglobin noted to be markedly low at 2.9 with remarkably low MCV at 63.5.  Creatinine also elevated at 1.03 consistent with history of poor p.o. intake. Patient was subsequently seen by gynecology on 6/5 but patient has been refusing any durable treatment option but was amenable at some point to Megace 40 twice dailypsychiatry was also consulted who initially recommended inpatient psych placement. Patient agreed for blood on 6/11.  She was reevaluated by gynecology on 6/12 who felt Megace could be continued indefinitely to prevent further bleeding if she is willing to take it.  She was reevaluated by psychiatry on 6/21 and 6/24 who felt the patient did not need inpatient psychiatric care.   6/24: Initially getting ready for discharge, however upon repeat H/h, Hb 6.7. 1u PRBC ordered   A & P   Active Problems:   Anemia, iron deficiency   Schizoaffective disorder, bipolar type ([facility])   Menorrhagia   Acute blood loss anemia   Paranoia (psychosis) ([place])   1. Acute on chronic anemia secondary to blood loss from menorrhagia s/p 3  units PRBCs a. Hb 2.9 on admission, improved after 3 units PRBCs b. Patient reported recurrence of menorrhagia and is on day 2 of menstrual cycle c. Prior hospitalist discussed with OB/GYN on 6/22 who felt Megace could be continued indefinitely for prevention of further bleeding if she was willing to take it d. CBC this a.m. showed Hb 7.2, initially was getting ready for discharge however upon repeat labs her Hb was 6.7 this afternoon.  1 unit PRBCs ordered (total of 4 units PRBCs)  2. Schizoaffective disorder/bipolar disorder a. Initially 2 separate physicians agreed with plan for forcing medications. b. Initially recommended for inpatient psychiatry however she was revisited by psychiatry on 6/21 and 6/24 who stated patient is at her baseline, does not meet inpatient criteria and can follow-up outpatient. c. Continues to decline her psychiatric medications and so none will be prescribed at discharge d. Per psych, patient responds well to Haldol p.o. and long-acting injectable Haldol  3. Severe psychosocial stressors and homelessness a. See Dr. Arlyss Queen note and case manager Ms. Torres note from 6/21 b. Update 6/24: Long discussion this afternoon with the patient and floor nurse manager present.  Patient expressed some verbal aggression toward hospitalist and is unfortunately untrusting of most staff members, despite specific reasoning for this, and she is very concerned about her disposition at discharge. social work has been involved extensively on this case and unfortunately the patient has refused placement to recommended facilities.  She is requesting to speak with the social worker in person which has been communicated  with SW.  Of note, she does not wish for anybody to call her room or the 3 [address]. floor to be transferred to her room, including her family   DVT prophylaxis: Place and maintain sequential compression device Start: 02/02/20 1143 Place and maintain sequential compression device  Start: 02/02/20 1140   Family Communication: No family at bedside  Disposition Plan:   She will remain inpatient for today to receive a blood transfusion.  If hemoglobin remains stable she can be discharged tomorrow.  Status is: Inpatient  Remains inpatient appropriate because:Unsafe d/c plan   Dispo: The patient is from: Home              Anticipated d/c is to: Home              Anticipated d/c date is: 1 day              Patient currently is not medically stable to d/c.          Pressure injury documentation    None  Consultants  Psychiatry OB GYN  Procedures  None  Antibiotics   Anti-infectives (From admission, onward)   None        Subjective   This a.m., patient reported having recurrence of menstrual bleeding with significant blood clots.  She did not report any other symptoms related to this at that time.  Later in the afternoon, the plan was to have the patient discharged however upon repeat hemoglobin, lab came back at 6.7.  I reported to the bedside to relay this information to the patient who requested a nurse to be present.  After I asked the bedside nurse to accompany [place], she refused to speak with the bedside nurse and requested a different staff member.  [name], Research officer, political party, was able to come to the bedside to help discussed the patient's concerns and medical course.  Patient began to discuss her past social and medical history and quite some detail.  I asked to shut the TV as the volume was distracting but the patient refused and became defensive and at that point was verbally aggressive towards self and return to speaking with the nurse manager.  She requested to speak with the social worker in person.  Also requested that she have no outside phone calls to her room or to the floor transfer to her room.  She was also very concerned regarding clothing at discharge.  Nurse manager reported that she would attempt to find patient's clothing by the time  of discharge. I was able to relay the patient's information regarding her anemia and requirement for blood transfusion for today.   Objective   Vitals:   02/19/20 0554 02/19/20 0959 02/19/20 2052 02/20/20 0515  BP: 131/77  124/77 130/79  Pulse: 99  85 78  Resp: 16 16 16 18   Temp: 98.5 F (36.9 C)  98.6 F (37 C) 98.4 F (36.9 C)  TempSrc: Oral  Oral Oral  SpO2: 100%  100% 100%  Weight:      Height:        Intake/Output Summary (Last 24 hours) at 02/20/2020 1512 Last data filed at 02/20/2020 0910 Gross per 24 hour  Intake 1560 ml  Output --  Net 1560 ml   Filed Weights   01/31/20 0440 02/16/20 1831  Weight: 54.4 kg 62.2 kg    Examination:  Physical Exam Vitals and nursing note reviewed. Exam conducted with a chaperone present.  Constitutional:      Appearance:  Normal appearance.  HENT:     Head: Normocephalic and atraumatic.  Eyes:     Conjunctiva/sclera: Conjunctivae normal.  Cardiovascular:     Rate and Rhythm: Normal rate and regular rhythm.  Pulmonary:     Effort: Pulmonary effort is normal.     Breath sounds: Normal breath sounds.  Abdominal:     General: Abdomen is flat.     Palpations: Abdomen is soft.  Musculoskeletal:        General: No swelling or tenderness.  Skin:    Coloration: Skin is not jaundiced or pale.  Neurological:     Mental Status: She is alert. Mental status is at baseline.  Psychiatric:        Mood and Affect: Affect is blunt.        Speech: Speech is rapid and pressured.        Behavior: Behavior is agitated.     Data Reviewed: I have personally reviewed following labs and imaging studies  CBC: Recent Labs  Lab 02/14/20 0853 02/20/20 0648 02/20/20 1414  WBC 5.6 6.1  --   NEUTROABS 3.7  --   --   HGB 8.4* 7.2* 6.7*  HCT 29.8* 26.6* 24.0*  MCV 77.8* 80.1  --   PLT 534* 300  --    Basic Metabolic Panel: Recent Labs  Lab 02/14/20 0853 02/20/20 0648  NA 136 136  K 3.9 4.3  CL 103 106  CO2 24 24  GLUCOSE 104* 113*   BUN 15 16  CREATININE 0.82 0.77  CALCIUM 8.9 8.7*   GFR: Estimated Creatinine Clearance: 86.4 mL/min (by C-G formula based on SCr of 0.77 mg/dL). Liver Function Tests: Recent Labs  Lab 02/14/20 0853  AST 19  ALT 14  ALKPHOS 43  BILITOT 0.9  PROT 7.6  ALBUMIN 3.9   No results for input(s): LIPASE, AMYLASE in the last 168 hours. No results for input(s): AMMONIA in the last 168 hours. Coagulation Profile: No results for input(s): INR, PROTIME in the last 168 hours. Cardiac Enzymes: No results for input(s): CKTOTAL, CKMB, CKMBINDEX, TROPONINI in the last 168 hours. BNP (last 3 results) No results for input(s): PROBNP in the last 8760 hours. HbA1C: No results for input(s): HGBA1C in the last 72 hours. CBG: No results for input(s): GLUCAP in the last 168 hours. Lipid Profile: No results for input(s): CHOL, HDL, LDLCALC, TRIG, CHOLHDL, LDLDIRECT in the last 72 hours. Thyroid Function Tests: No results for input(s): TSH, T4TOTAL, FREET4, T3FREE, THYROIDAB in the last 72 hours. Anemia Panel: No results for input(s): VITAMINB12, FOLATE, FERRITIN, TIBC, IRON, RETICCTPCT in the last 72 hours. Sepsis Labs: No results for input(s): PROCALCITON, LATICACIDVEN in the last 168 hours.  No results found for this or any previous visit (from the past 240 hour(s)).       Radiology Studies: No results found.      Scheduled Meds: . sodium chloride   Intravenous Once  . benztropine  1 mg Oral Daily  . folic acid  1 mg Oral Daily  . haloperidol lactate  10 mg Intramuscular BID   Or  . haloperidol  5 mg Oral BID  . megestrol  40 mg Oral BID  . multivitamin with minerals  1 tablet Oral Daily  . risperiDONE microspheres  50 mg Intramuscular Q14 Days  . thiamine  100 mg Oral Daily  . vitamin B-12  1,000 mcg Oral Daily   Continuous Infusions: . lactated ringers 30 mL/hr at 02/09/20 1554  Time spent: 45 minutes with over 50% of the time coordinating the patient's  care    Harold Hedge, DO [facility] Pager [phone]  Call night coverage person covering after 7pm

## 2020-02-21 LAB — HEMOGLOBIN AND HEMATOCRIT, BLOOD
HCT: 26.1 % — ABNORMAL LOW (ref 36.0–46.0)
Hemoglobin: 7.5 g/dL — ABNORMAL LOW (ref 12.0–15.0)

## 2020-02-21 LAB — TYPE AND SCREEN
ABO/RH(D): O POS
Antibody Screen: NEGATIVE
Donor AG Type: NEGATIVE
Unit division: 0

## 2020-02-21 LAB — CBC
HCT: 26.9 % — ABNORMAL LOW (ref 36.0–46.0)
Hemoglobin: 7.8 g/dL — ABNORMAL LOW (ref 12.0–15.0)
MCH: 22.7 pg — ABNORMAL LOW (ref 26.0–34.0)
MCHC: 29 g/dL — ABNORMAL LOW (ref 30.0–36.0)
MCV: 78.2 fL — ABNORMAL LOW (ref 80.0–100.0)
Platelets: 290 10*3/uL (ref 150–400)
RBC: 3.44 MIL/uL — ABNORMAL LOW (ref 3.87–5.11)
RDW: 26.9 % — ABNORMAL HIGH (ref 11.5–15.5)
WBC: 7.3 10*3/uL (ref 4.0–10.5)
nRBC: 0 % (ref 0.0–0.2)

## 2020-02-21 LAB — BPAM RBC
Blood Product Expiration Date: 202107212359
ISSUE DATE / TIME: 202106241951
Unit Type and Rh: 5100

## 2020-02-21 LAB — FERRITIN: Ferritin: 2 ng/mL — ABNORMAL LOW (ref 11–307)

## 2020-02-21 MED ORDER — SODIUM CHLORIDE 0.9 % IV SOLN
125.0000 mg | Freq: Once | INTRAVENOUS | Status: AC
  Administered 2020-02-21: 125 mg via INTRAVENOUS
  Filled 2020-02-21: qty 10
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
## 2020-02-21 NOTE — TOC Progression Note (Signed)
Transition of Care [facility]) - Progression Note    Patient Details  Name: Valerie Howard MRN: [id number] Date of Birth: 12/01/1975  Transition of Care [facility]) CM/SW [place], LCSW Phone Number: 02/21/2020, 1:11 PM  Clinical Narrative:    CSW met with patient at bedside per request of the patient. CSW introduced self and role to the patient. Patient asked, "why are you here, I didn't ask to see you. Y' all have an agenda and trying to play me. I never asked to speak to you alone. I don't want to talk with you alone. Go get the nurse." CSW brought the Surveyor, quantity (AD) into the room. Patient spoke directly to the AD. She asked about clothing. AD offered to bring her clothing. Patient agreed to try them on. Patient declined information about the housing authority or the shelter. She reports that she can work and is not poor and is not interested in section 8 housing or any resources that the hospital has to offer. Patient had no further comments or question for this Probation officer. CSW signing off.    Expected Discharge Plan: Home/Self Care Barriers to Discharge: Barriers Resolved  Expected Discharge Plan and Services Expected Discharge Plan: Home/Self Care         Expected Discharge Date: 02/21/20                                     Social Determinants of Health (SDOH) Interventions    Readmission Risk Interventions No flowsheet data found.

## 2020-02-24 ENCOUNTER — Emergency Department (HOSPITAL_COMMUNITY)
Admission: EM | Admit: 2020-02-24 | Discharge: 2020-02-24 | Disposition: A | Discharge status: Home / Self Care | Payer: Medicaid Other | Attending: Emergency Medicine | Admitting: Emergency Medicine

## 2020-02-24 ENCOUNTER — Other Ambulatory Visit: Payer: Self-pay

## 2020-02-24 ENCOUNTER — Encounter (HOSPITAL_COMMUNITY): Payer: Self-pay

## 2020-02-24 ENCOUNTER — Ambulatory Visit (HOSPITAL_COMMUNITY)
Admission: RE | Admit: 2020-02-24 | Discharge: 2020-02-24 | Disposition: A | Discharge status: Home / Self Care | Payer: Medicaid Other | Attending: Psychiatry | Admitting: Psychiatry

## 2020-02-24 DIAGNOSIS — M7989 Other specified soft tissue disorders: Secondary | ICD-10-CM | POA: Diagnosis not present

## 2020-02-24 DIAGNOSIS — R609 Edema, unspecified: Secondary | ICD-10-CM

## 2020-02-24 DIAGNOSIS — Z5900 Homelessness unspecified: Secondary | ICD-10-CM

## 2020-02-24 NOTE — Discharge Instructions (Signed)
You have leg swelling likely from sleeping outside and the heat   Keep leg elevated. Try compression stockings   See your doctor  Return to ER if you have worse leg swelling, bleeding, chest pain, trouble breathing.

## 2020-02-24 NOTE — ED Notes (Signed)
Pt able to drink ginger ale with no issues
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
## 2020-02-24 NOTE — ED Notes (Signed)
Pt waiting on social work before discharge

## 2020-02-24 NOTE — H&P (Signed)
Behavioral Health Medical Screening Exam  Valerie Howard is an 44 y.o. female.who presented to [facility] as a walk-in, voluntarily.  Patient is known to the [facility] system. Her psychiatric history is extensive and significant for schizophrenia and paranoid behavior. She stated she ws here today to find housing as she had been sleeping on the streets for the last 3 days.  Stated," I am here because last time I came I was told that I could get help with getting a hotel." She denied suicidal/self-harm/homicidal ideation, psychosis, and paranoia. She did not appear to be responding to internal/external stimuli or delusional thoughts. At times, her conversations were a little scattered although she answered most questions appropriately. She stated that she was not taking any previously prescribed psychotropic medications despite her psychiatric history. She added," I don't take them because I done need them."  Per chart review, her last psychiatric hospitalization was 07/2020 although it appears that she has presented to the ED numerous times that followed for agitation  and acute psychosis. She denied substance abuse or use. She gave no mention of other concerns at this time besides housing.   Total Time spent with patient: 20 minutes  Psychiatric Specialty Exam: Physical Exam Neurological:     Mental Status: She is alert.  Psychiatric:        Mood and Affect: Mood normal.        Behavior: Behavior normal.        Thought Content: Thought content normal.        Judgment: Judgment normal.    Review of Systems  Psychiatric/Behavioral: Negative for agitation, behavioral problems, confusion, decreased concentration, dysphoric mood, hallucinations, self-injury, sleep disturbance and suicidal ideas. The patient is not nervous/anxious and is not hyperactive.   All other systems reviewed and are negative.  Last menstrual period 01/28/2020.There is no height or weight on file to calculate BMI. General  Appearance: Fairly Groomed Eye Contact:  Good Speech:  Clear and Coherent and Normal Rate Volume:  Normal Mood:  Euthymic Affect:  Appropriate Thought Process:  Coherent, Linear and Descriptions of Associations: Intact Orientation:  Full (Time, Place, and Person) Thought Content:  Logical Suicidal Thoughts:  No Homicidal Thoughts:  No Memory:  Immediate;   Fair Recent;   Fair Judgement:  Fair Insight:  Fair Psychomotor Activity:  Normal Concentration: Concentration: Fair and Attention Span: Fair Recall:  [employer] of Knowledge:Fair Language: Good Akathisia:  Negative Handed:  Right AIMS (if indicated):    Assets:  Communication Skills Resilience Sleep:     Musculoskeletal: Strength & Muscle Tone: within normal limits Gait & Station: normal Patient leans: N/A  Last menstrual period 01/28/2020.  Recommendations: Based on my evaluation the patient does not appear to have an emergency medical condition.   No evidence of imminent risk to self or others at present.   Patient does not meet criteria for psychiatric inpatient admission and was therefore cleared to leave the hospital. We discussed the need for outpatient psychiatric services however, she declined and insisted that she only needed resources for housing. Resources were provided to patient from TTS clinician.    Reccommended to continue follow-up with his outpatient team..     Mordecai Maes, NP 02/24/2020, 3:10 PM

## 2020-02-24 NOTE — ED Triage Notes (Signed)
Patient c/o bilateral leg swelling L>R since yesterday.
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
## 2020-02-24 NOTE — ED Provider Notes (Signed)
[place] DEPT Provider Note   CSN: [id number] Arrival date & time: 02/24/20  1545     History Chief Complaint  Patient presents with  . Leg Swelling    Valerie Howard is a 44 y.o. female history of schizophrenia and paranoia, recent admission for anemia who presented with needing housing as well as leg swelling. Patient was recently admitted and had IV iron transfusions.  Patient has been homeless for last 3 days.  Patient apparently had changes sleeping on the street.  Went to behavioral health earlier today and was cleared by psychiatry.  Patient then checked in the ED afterwards for evaluation.  Patient states that her left leg is more swollen than the right. Denies chest pain or shortness of breath. Denies any further vaginal bleeding. States that she needs a place to stay.   The history is provided by the patient.       Past Medical History:  Diagnosis Date  . History of radiation therapy 07/18/17-07/24/17   right ear, operative bed 12 Gy in 3 fractions  . Keloid    right ear  . Paranoid behavior ([place])   . Schizophrenia [facility])     Patient Active Problem List   Diagnosis Date Noted  . Paranoia (psychosis) ([place])   . Acute blood loss anemia 02/27/2019  . 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 Schizoaffective disorder, bipolar type ([place])   . Menorrhagia 05/12/2014  . Personality disorder ([place]) 10/17/2012  . Anemia, iron deficiency 10/13/2012  . Tachycardia 10/01/2012    Past Surgical History:  Procedure Laterality Date  . CESAREAN SECTION    . KENALOG INJECTION Bilateral 07/17/2017   Procedure: KENALOG INJECTION;  Surgeon: Wallace Going, DO;  Location: [place];  Service: Plastics;  Laterality: Bilateral;  . MASS EXCISION N/A 07/17/2017   Procedure: EXCISION KELOID OF RIGHT EAR WITH INTROPERATIVE KENALOG INJECTION AND POST OP RADIATION;  Surgeon: Wallace Going, DO;  Location: [facility];  Service:  Plastics;  Laterality: N/A;  . TUBAL LIGATION       OB History   No obstetric history on file.     Family History  Problem Relation Age of Onset  . Hypertension Other   . Diabetes Other   . Cancer Other   . Keloids Mother   . Breast cancer Maternal Grandmother     Social History   Tobacco Use  . Smoking status: Never Smoker  . Smokeless tobacco: Never Used  Vaping Use  . Vaping Use: Never used  Substance Use Topics  . Alcohol use: Yes    Comment: socially  . Drug use: No    Home Medications Prior to Admission medications   Medication Sig Start Date End Date Taking? Authorizing Provider  Ferrous Sulfate (IRON PO) Take 1 tablet by mouth daily. Patient not taking: Reported on 02/24/2020    [provider]    Allergies    Penicillins  Review of Systems   Review of Systems  Cardiovascular: Positive for leg swelling.  All other systems reviewed and are negative.   Physical Exam Updated Vital Signs BP 133/75 (BP Location: Left Arm)   Pulse (!) 106   Temp 98.6 F (37 C) (Oral)   Resp 14   Ht 5\' 7"  (1.702 m)   Wt 69.6 kg   LMP 01/28/2020 (Approximate)   SpO2 99%   BMI 24.03 kg/m   Physical Exam Vitals and nursing note reviewed.  Constitutional:  Comments: Disheveled   HENT:     Head: Normocephalic.     Nose: Nose normal.     Mouth/Throat:     Mouth: Mucous membranes are moist.  Eyes:     Pupils: Pupils are equal, round, and reactive to light.  Cardiovascular:     Rate and Rhythm: Normal rate.     Pulses: Normal pulses.  Pulmonary:     Effort: Pulmonary effort is normal.  Abdominal:     General: Abdomen is flat.  Musculoskeletal:     Cervical back: Normal range of motion.     Comments: Mild trace edema R leg, 1+ edema L leg. No calf tenderness   Skin:    General: Skin is warm.     Capillary Refill: Capillary refill takes less than 2 seconds.  Neurological:     General: No focal deficit present.     Mental Status: She is alert.    Psychiatric:        Mood and Affect: Mood normal.        Behavior: Behavior normal.     ED Results / Procedures / Treatments   Labs (all labs ordered are listed, but only abnormal results are displayed) Labs Reviewed - No data to display  EKG None  Radiology No results found.  Procedures Procedures (including critical care time)  EMERGENCY DEPARTMENT US EXTREMITY EXAM "Study:  Limited Duplex of left Lower Extremity Veins"  INDICATIONS: Leg swelling Visualization of  regions in transverse plane with full compression visualized.   PERFORMED BY: Myself IMAGES ARCHIVED?: Yes VIEWS USED: Saphenous-femoral junction, Proximal femoral vein and Popliteal vein INTERPRETATION: No DVT visualized       Medications Ordered in ED Medications - No data to display  ED Course  I have reviewed the triage vital signs and the nursing notes.  Pertinent labs & imaging results that were available during my care of the patient were reviewed by me and considered in my medical decision making (see chart for details).    MDM Rules/Calculators/A&P                          Valerie Howard is a 44 y.o. female here with leg swelling, need placement. Patient has bilateral leg swelling, slightly worse on the left. I performed bedside US and showed no proximal DVT on the left. She was recently admitted for anemia but has no further bleeding. She went to behavioral health earlier and was cleared by psych and referred to social work outpatient. I left a message for social work regarding homeless needs.   9:47 PM Roderic Palau from social work provided some resources regarding housing. Stable for discharge.   Final Clinical Impression(s) / ED Diagnoses Final diagnoses:  None    Rx / DC Orders ED Discharge Orders    None       Drenda Freeze, MD 02/24/20 2147

## 2020-02-25 ENCOUNTER — Other Ambulatory Visit: Payer: Self-pay

## 2020-02-25 ENCOUNTER — Encounter (HOSPITAL_COMMUNITY): Payer: Self-pay

## 2020-02-25 ENCOUNTER — Emergency Department (HOSPITAL_COMMUNITY)
Admission: EM | Admit: 2020-02-25 | Discharge: 2020-02-25 | Disposition: A | Discharge status: Home / Self Care | Payer: Medicaid Other | Attending: Emergency Medicine | Admitting: Emergency Medicine

## 2020-02-25 DIAGNOSIS — R2241 Localized swelling, mass and lump, right lower limb: Secondary | ICD-10-CM | POA: Diagnosis not present

## 2020-02-25 DIAGNOSIS — Z5900 Homelessness unspecified: Secondary | ICD-10-CM

## 2020-02-25 DIAGNOSIS — M7989 Other specified soft tissue disorders: Secondary | ICD-10-CM

## 2020-02-25 DIAGNOSIS — R2242 Localized swelling, mass and lump, left lower limb: Secondary | ICD-10-CM | POA: Diagnosis not present

## 2020-02-25 NOTE — Social Work (Signed)
CSW was contacted by [facility] ED Security, Payton Doughty.  Roselyn Reef called at 1:45pm about pt needing housing resources.  She stated pt has been outside of ED since 7am, but never mentioned she was waiting to see a Education officer, museum until she asked did she need a ride or was someone picking her up.    CSW took pt resources for homeless shelters, but pt rejected those resources stating she needs something more permanent.  CSW validated pts concerns, but reassured her options.  CSW disclosed her options to seek a shelter or apply for section 8 at housing authority.  Pt stated that no shelter has an opening, "that takes 2 months", and section 8, "is a process and that takes too much time."  She inquired about [facility] for rent and utilities, but  CSW informed her that she must first apply for housing or have housing before they can assist.    Pt then asked for food Billie at ED from desk will ask if she can assist pt with that since she has been dc'd.  CSW also spoke with pt about  [employer] since she didn't have a phone.  Pt continued to refuse resources from pm and am CSW's resources.

## 2020-02-25 NOTE — ED Triage Notes (Signed)
Pt just discharged. Comes back and states ankles are still swollen.

## 2020-02-25 NOTE — ED Provider Notes (Signed)
[place] DEPT Provider Note   CSN: [id number] Arrival date & time: 02/25/20  4098     History Chief Complaint  Patient presents with  . Joint Swelling    Valerie Howard is a 44 y.o. female.  Patient returns to the emergency department with further concerns regarding LE swelling. She was evaluated for same and discharged 6 hours prior to current presentation. No change in symptoms, she simply states she wants something done about the swelling now.   The history is provided by the patient. No language interpreter was used.       Past Medical History:  Diagnosis Date  . History of radiation therapy 07/18/17-07/24/17   right ear, operative bed 12 Gy in 3 fractions  . Keloid    right ear  . Paranoid behavior ([place])   . Schizophrenia [facility])     Patient Active Problem List   Diagnosis Date Noted  . Paranoia (psychosis) ([place])   . Acute blood loss anemia 02/27/2019  . 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 Schizoaffective disorder, bipolar type ([place])   . Menorrhagia 05/12/2014  . Personality disorder ([place]) 10/17/2012  . Anemia, iron deficiency 10/13/2012  . Tachycardia 10/01/2012    Past Surgical History:  Procedure Laterality Date  . CESAREAN SECTION    . KENALOG INJECTION Bilateral 07/17/2017   Procedure: KENALOG INJECTION;  Surgeon: Wallace Going, DO;  Location: [facility];  Service: Plastics;  Laterality: Bilateral;  . MASS EXCISION N/A 07/17/2017   Procedure: EXCISION KELOID OF RIGHT EAR WITH INTROPERATIVE KENALOG INJECTION AND POST OP RADIATION;  Surgeon: Wallace Going, DO;  Location: [place];  Service: Plastics;  Laterality: N/A;  . TUBAL LIGATION       OB History   No obstetric history on file.     Family History  Problem Relation Age of Onset  . Hypertension Other   . Diabetes Other   . Cancer Other   . Keloids Mother   . Breast cancer Maternal Grandmother     Social History    Tobacco Use  . Smoking status: Never Smoker  . Smokeless tobacco: Never Used  Vaping Use  . Vaping Use: Never used  Substance Use Topics  . Alcohol use: Yes    Comment: socially  . Drug use: No    Home Medications Prior to Admission medications   Medication Sig Start Date End Date Taking? Authorizing Provider  Ferrous Sulfate (IRON PO) Take 1 tablet by mouth daily. Patient not taking: Reported on 02/24/2020    [provider]    Allergies    Penicillins  Review of Systems   Review of Systems  Respiratory: Negative for shortness of breath.   Musculoskeletal:       See HPI.  Skin: Negative for color change.  Neurological: Negative for numbness.    Physical Exam Updated Vital Signs BP 136/73 (BP Location: Right Arm)   Pulse 83   Temp 98.4 F (36.9 C) (Oral)   Resp 16   Ht 5\' 7"  (1.702 m)   Wt 69.5 kg   LMP 01/28/2020 (Approximate)   SpO2 100%   BMI 24.00 kg/m   Physical Exam Vitals and nursing note reviewed.  Constitutional:      Appearance: She is well-developed.  Pulmonary:     Effort: Pulmonary effort is normal.  Musculoskeletal:     Cervical back: Normal range of motion.     Comments: Mild bilateral LE swelling. No warmth. Pulses present.  Skin:    General: Skin is warm and dry.  Neurological:     Mental Status: She is alert and oriented to person, place, and time.     ED Results / Procedures / Treatments   Labs (all labs ordered are listed, but only abnormal results are displayed) Labs Reviewed - No data to display  EKG None  Radiology No results found.  Procedures Procedures (including critical care time)  Medications Ordered in ED Medications - No data to display  ED Course  I have reviewed the triage vital signs and the nursing notes.  Pertinent labs & imaging results that were available during my care of the patient were reviewed by me and considered in my medical decision making (see chart for details).    MDM  Rules/Calculators/A&P                          Patient returns to ED with persistent LE swelling since previous assessment, discharged 6 hours prior to current visit.   Chart reviewed. Patient with history of schizophrenia, multiple psychiatric admissions but seen at [facility] as walk in yesterday and cleared from standpoint of need for psychiatric admission, stating she needed only help with housing as she has found herself homeless. She then came to the ED after leaving [facility] for c/o LE swelling. She was evaluated by dr. Darl Householder, r/o DVT by [place]. Has a history of severe anemia requiring transfusions but denies any current bleeding. VSS.   She returns now stating she is still swollen. My feeling is that she returned in order to have a place to sleep. She is considered stable from a medical and psychiatric perspective and can be discharged. Resources for housing were provided by TTS counselor at [facility] yesterday. She will be encouraged to follow up with these resources.   Final Clinical Impression(s) / ED Diagnoses Final diagnoses:  None   1. LE swelling 2. homelessness  Rx / DC Orders ED Discharge Orders    None       [name], Hershal Coria 02/25/20 2951    Mesner, Corene Cornea, MD 02/25/20 [phone]

## 2020-03-12 ENCOUNTER — Other Ambulatory Visit: Payer: Self-pay

## 2020-03-12 ENCOUNTER — Emergency Department (HOSPITAL_COMMUNITY): Payer: Medicaid Other

## 2020-03-12 ENCOUNTER — Emergency Department (HOSPITAL_COMMUNITY)
Admission: EM | Admit: 2020-03-12 | Discharge: 2020-03-12 | Disposition: A | Discharge status: Home / Self Care | Payer: Medicaid Other | Attending: Emergency Medicine | Admitting: Emergency Medicine

## 2020-03-12 ENCOUNTER — Encounter (HOSPITAL_COMMUNITY): Payer: Self-pay | Admitting: Emergency Medicine

## 2020-03-12 DIAGNOSIS — Z590 Homelessness: Secondary | ICD-10-CM | POA: Diagnosis not present

## 2020-03-12 DIAGNOSIS — R2243 Localized swelling, mass and lump, lower limb, bilateral: Secondary | ICD-10-CM | POA: Insufficient documentation

## 2020-03-12 DIAGNOSIS — M7989 Other specified soft tissue disorders: Secondary | ICD-10-CM

## 2020-03-12 DIAGNOSIS — N920 Excessive and frequent menstruation with regular cycle: Secondary | ICD-10-CM | POA: Insufficient documentation

## 2020-03-12 DIAGNOSIS — F22 Delusional disorders: Secondary | ICD-10-CM | POA: Insufficient documentation

## 2020-03-12 DIAGNOSIS — F419 Anxiety disorder, unspecified: Secondary | ICD-10-CM | POA: Diagnosis not present

## 2020-03-12 LAB — BASIC METABOLIC PANEL
Anion gap: 10 (ref 5–15)
BUN: 14 mg/dL (ref 6–20)
CO2: 24 mmol/L (ref 22–32)
Calcium: 8.9 mg/dL (ref 8.9–10.3)
Chloride: 107 mmol/L (ref 98–111)
Creatinine, Ser: 0.85 mg/dL (ref 0.44–1.00)
GFR calc Af Amer: >60 mL/min (ref >60)
GFR calc non Af Amer: >60 mL/min (ref >60)
Glucose, Bld: 80 mg/dL (ref 70–99)
Potassium: 3.9 mmol/L (ref 3.5–5.1)
Sodium: 141 mmol/L (ref 135–145)

## 2020-03-12 LAB — CBC
HCT: 28.9 % — ABNORMAL LOW (ref 36.0–46.0)
Hemoglobin: 7.8 g/dL — ABNORMAL LOW (ref 12.0–15.0)
MCH: 20.6 pg — ABNORMAL LOW (ref 26.0–34.0)
MCHC: 27 g/dL — ABNORMAL LOW (ref 30.0–36.0)
MCV: 76.5 fL — ABNORMAL LOW (ref 80.0–100.0)
Platelets: 401 10*3/uL — ABNORMAL HIGH (ref 150–400)
RBC: 3.78 MIL/uL — ABNORMAL LOW (ref 3.87–5.11)
RDW: 25.2 % — ABNORMAL HIGH (ref 11.5–15.5)
WBC: 4.9 10*3/uL (ref 4.0–10.5)
nRBC: 0 % (ref 0.0–0.2)

## 2020-03-12 LAB — TROPONIN I (HIGH SENSITIVITY): Troponin I (High Sensitivity): <2 ng/L (ref <18)

## 2020-03-12 LAB — I-STAT BETA HCG BLOOD, ED (MC, WL, AP ONLY): I-stat hCG, quantitative: <5 m[IU]/mL (ref <5)

## 2020-03-12 MED ORDER — SODIUM CHLORIDE 0.9% FLUSH: 3.0000 mL | Freq: Once | INTRAVENOUS | Status: DC

## 2020-03-12 NOTE — ED Triage Notes (Signed)
C/o heart racing that started today. Reports swelling to bilateral lower extremities x 4 days.  Denies SOB.  PT states she has been seen at [facility] for leg swelling.

## 2020-03-12 NOTE — Discharge Instructions (Addendum)
Today we did several tests. The blood work we did today showed anemia (low blood count) which is consistent with your baseline. We recommend you follow up with your gynecologist regarding your heavy menstrual periods. We also did an EKG and chest xray to evaluate your heart, and both of these tests were normal. We do not think you have an infection or blood clot. Make sure you stay hydrated, watch your salt intake, and you can try elevating your feet if the swelling continues to be bothersome. If you develop worsening symptoms, chest pain, and/or shortness of breath, return to the emergency department for evaluation.

## 2020-03-12 NOTE — ED Provider Notes (Signed)
[place] EMERGENCY DEPARTMENT Provider Note   CSN: [id number] Arrival date & time: 03/12/20  1141     History Chief Complaint  Patient presents with   Tachycardia    Valerie Howard is a 44 y.o. woman with history of schizophrenia and severe anemia requiring transfusions presents to the ED with bilateral lower extremity swelling.   The patient reports her lower leg swelling started yesterday and has been worse today. She denies pain but says her legs "just feel weird." She denies numbness or paresthesias. Is walking normally. She denies recent fever or chills, HA, CP, SOB, abdominal pain, dysuria, weakness.   She was seen at [facility] for evaluation of lower leg swelling on 6/28 where she was evaluated with bedside ultrasound to rule out DVT.   The patient is homeless and has most recently been sleeping on a bench outside for the past month. She reports she gets her food from [employer] and has been "[employer]." She does not have a car so walks most places. Reports she started her period today, and it is heavy with "clots the size of a 34-year-old's hands." Per chart review, she was referred to outpatient obgyn after a hospitalization from 01/29/20-02/21/20 with severe anemia requiring multiple blood transfusions. The patient states she does not take the medicine she was prescribed to help with the bleeding. She states she is prescribed Haldol and iron pills but also does not take those, stating, "I'm not crazy." The patient also complains of "somebody clicking" her eyes, and when asked to clarify what she means, she states, "the sheriff knows" but does not elaborate further.     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   Schizoaffective disorder, bipolar type ([place])    Menorrhagia 05/12/2014   Personality disorder ([place]) 10/17/2012   Anemia, iron deficiency 10/13/2012   Tachycardia 10/01/2012    Past Surgical History:  Procedure Laterality Date   CESAREAN SECTION     KENALOG INJECTION Bilateral 07/17/2017   Procedure: KENALOG INJECTION;  Surgeon: Valerie Going, DO;  Location: [place];  Service: Plastics;  Laterality: Bilateral;   MASS EXCISION N/A 07/17/2017   Procedure: EXCISION KELOID OF RIGHT EAR WITH INTROPERATIVE KENALOG INJECTION AND POST OP RADIATION;  Surgeon: Valerie Going, DO;  Location: [place];  Service: Plastics;  Laterality: N/A;   TUBAL LIGATION       OB History   No obstetric history on file.     Family History  Problem Relation Age of Onset   Hypertension Other    Diabetes Other    Cancer Other    Keloids Mother    Breast cancer Maternal Grandmother     Social History   Tobacco Use   Smoking status: Never Smoker   Smokeless tobacco: Never Used  Vaping Use   Vaping Use: Never used  Substance Use Topics   Alcohol use: Yes    Comment: socially   Drug use: No    Home Medications Prior to Admission medications   Medication Sig Start Date End Date Taking? Authorizing Provider  Ferrous Sulfate (IRON PO) Take 1 tablet by mouth daily. Patient not taking: Reported on 02/24/2020    [provider]    Allergies    Penicillins  Review of Systems   Review of Systems  All other systems reviewed and are negative.  10 systems reviewed and are negative for acute changes, except as noted in the HPI.  Physical Exam Updated Vital Signs BP 122/90    Pulse 72    Temp 98.2 F (36.8 C) (Oral)    Resp 16    LMP 03/12/2020    SpO2 100%   Physical Exam Constitutional:      Comments: Well-appearing woman in no acute  distress  HENT:     Head: Normocephalic and atraumatic.     Mouth/Throat:     Mouth: Mucous membranes are moist.  Eyes:     Extraocular Movements: Extraocular movements intact.     Pupils: Pupils are equal, round, and reactive to light.  Cardiovascular:     Rate and Rhythm: Normal rate and regular rhythm.     Pulses: Normal pulses.     Heart sounds: Normal heart sounds.     Comments: Patient has 1-2+ ptting edema of the bilateral lower extremities beginning at the mid-shin. Overlying skin is non-erythematous. No tenderness to palpation. Pulmonary:     Effort: Pulmonary effort is normal.     Breath sounds: Normal breath sounds.  Abdominal:     Tenderness: There is no abdominal tenderness.     Comments: Firmness palpated to the R of the patient's umbilicus, extending slightly into RLQ, nontender to palpation, otherwise soft and non-distended  Musculoskeletal:     Right lower leg: Edema present.     Left lower leg: Edema present.  Skin:    General: Skin is warm and dry.  Neurological:     Mental Status: She is alert and oriented to person, place, and time.  Psychiatric:        Mood and Affect: Mood is anxious.        Speech: Speech normal.        Behavior: Behavior is cooperative.        Thought Content: Thought content is paranoid.     Comments: Thought process seems circumferential and/or tangential at times. Speed is rapid. Perseverating. Patient states, "I'm not crazy." Describes eating only food that is wrapped.     ED Results / Procedures / Treatments   Labs (all labs ordered are listed, but only abnormal results are displayed) Labs Reviewed  CBC - Abnormal; Notable for the following components:      Result Value   RBC 3.78 (*)    Hemoglobin 7.8 (*)    HCT 28.9 (*)    MCV 76.5 (*)    MCH 20.6 (*)    MCHC 27.0 (*)    RDW 25.2 (*)    Platelets 401 (*)    All other components within normal limits  BASIC METABOLIC PANEL  I-STAT BETA HCG BLOOD, ED (MC, WL, AP ONLY)    TROPONIN I (HIGH SENSITIVITY)    EKG: reviewed, NSR  Radiology DG Chest 2 View  Result Date: 03/12/2020 CLINICAL DATA:  Heart racing and bilateral leg swelling. EXAM: CHEST - 2 VIEW COMPARISON:  01/31/2020 FINDINGS: The cardiac silhouette, mediastinal and hilar contours are within normal limits. The lungs are clear. No pleural effusions or pulmonary lesions. The bony thorax is intact. IMPRESSION: No acute cardiopulmonary findings. Electronically Signed   By: Ricky Stabs.D.  On: 03/12/2020 12:15   Medications Ordered in ED Medications  sodium chloride flush (NS) 0.9 % injection 3 mL (has no administration in time range)    ED Course  I have reviewed the triage vital signs and the nursing notes.  Pertinent labs & imaging results that were available during my care of the patient were reviewed by me and considered in my medical decision making (see chart for details).    MDM Rules/Calculators/A&P                          Valerie Howard is a 44 y.o. woman with history of schizophrenia and severe anemia requiring transfusions presents to the ED with bilateral lower extremity swelling.   Patient is well-appearing with stable vitals. On exam, she has bilateral mild pitting edema from the mid-shins to the the ankles. Distal pulses intact. This appears to be a chronic problem, more consistent with venous stasis. No signs of infection. Ultrasound on 6/28 negative for DVT. CXR unremarkable, without cardiomegaly. BMP unremarkable. CBC notable for hemoglobin of 7.8 which is chronic for her. Troponin wnl, and EKG demonstrates NSR. Consult to social work placed as patient is homeless. She was discharged with return precautions and recommendation to follow-up with her gynecologist for her known heavy menstrual bleeding.   Final Clinical Impression(s) / ED Diagnoses Final diagnoses:  Leg swelling    Rx / DC Orders ED Discharge Orders    None       Alexandria Lodge, MD 03/12/20 2228     Carmin Muskrat, MD 03/13/20 0004

## 2020-03-18 ENCOUNTER — Emergency Department (HOSPITAL_COMMUNITY): Payer: Medicaid Other

## 2020-03-18 ENCOUNTER — Observation Stay (HOSPITAL_COMMUNITY)
Admission: EM | Admit: 2020-03-18 | Discharge: 2020-03-19 | Disposition: A | Discharge status: Home / Self Care | Payer: Medicaid Other | Attending: Internal Medicine | Admitting: Internal Medicine

## 2020-03-18 DIAGNOSIS — Z20822 Contact with and (suspected) exposure to covid-19: Secondary | ICD-10-CM | POA: Insufficient documentation

## 2020-03-18 DIAGNOSIS — R531 Weakness: Secondary | ICD-10-CM | POA: Diagnosis present

## 2020-03-18 DIAGNOSIS — N920 Excessive and frequent menstruation with regular cycle: Secondary | ICD-10-CM | POA: Diagnosis not present

## 2020-03-18 DIAGNOSIS — K5900 Constipation, unspecified: Secondary | ICD-10-CM | POA: Insufficient documentation

## 2020-03-18 DIAGNOSIS — R600 Localized edema: Secondary | ICD-10-CM | POA: Diagnosis not present

## 2020-03-18 DIAGNOSIS — D649 Anemia, unspecified: Secondary | ICD-10-CM | POA: Diagnosis not present

## 2020-03-18 DIAGNOSIS — D500 Iron deficiency anemia secondary to blood loss (chronic): Secondary | ICD-10-CM | POA: Diagnosis not present

## 2020-03-18 DIAGNOSIS — D509 Iron deficiency anemia, unspecified: Secondary | ICD-10-CM

## 2020-03-18 LAB — CBC
HCT: 23 % — ABNORMAL LOW (ref 36.0–46.0)
Hemoglobin: 6.1 g/dL — CL (ref 12.0–15.0)
MCH: 20.8 pg — ABNORMAL LOW (ref 26.0–34.0)
MCHC: 26.5 g/dL — ABNORMAL LOW (ref 30.0–36.0)
MCV: 78.5 fL — ABNORMAL LOW (ref 80.0–100.0)
Platelets: 272 10*3/uL (ref 150–400)
RBC: 2.93 MIL/uL — ABNORMAL LOW (ref 3.87–5.11)
RDW: 24.6 % — ABNORMAL HIGH (ref 11.5–15.5)
WBC: 5.2 10*3/uL (ref 4.0–10.5)
nRBC: 0 % (ref 0.0–0.2)

## 2020-03-18 LAB — BASIC METABOLIC PANEL
Anion gap: 9 (ref 5–15)
BUN: 13 mg/dL (ref 6–20)
CO2: 23 mmol/L (ref 22–32)
Calcium: 8.7 mg/dL — ABNORMAL LOW (ref 8.9–10.3)
Chloride: 109 mmol/L (ref 98–111)
Creatinine, Ser: 0.95 mg/dL (ref 0.44–1.00)
GFR calc Af Amer: >60 mL/min (ref >60)
GFR calc non Af Amer: >60 mL/min (ref >60)
Glucose, Bld: 130 mg/dL — ABNORMAL HIGH (ref 70–99)
Potassium: 3.8 mmol/L (ref 3.5–5.1)
Sodium: 141 mmol/L (ref 135–145)

## 2020-03-18 LAB — SARS CORONAVIRUS 2 BY RT PCR (HOSPITAL ORDER, PERFORMED IN ~~LOC~~ HOSPITAL LAB): SARS Coronavirus 2: NEGATIVE

## 2020-03-18 LAB — TROPONIN I (HIGH SENSITIVITY)
Troponin I (High Sensitivity): 3 ng/L (ref <18)
Troponin I (High Sensitivity): 4 ng/L (ref <18)

## 2020-03-18 LAB — PREPARE RBC (CROSSMATCH)

## 2020-03-18 LAB — HIV ANTIBODY (ROUTINE TESTING W REFLEX): HIV Screen 4th Generation wRfx: NONREACTIVE

## 2020-03-18 MED ORDER — SODIUM CHLORIDE 0.9 % IV SOLN
510.0000 mg | Freq: Once | INTRAVENOUS | Status: AC
  Administered 2020-03-19: 510 mg via INTRAVENOUS
  Filled 2020-03-18: qty 17

## 2020-03-18 MED ORDER — SODIUM CHLORIDE 0.9% IV SOLUTION: Freq: Once | INTRAVENOUS | Status: AC

## 2020-03-18 MED ORDER — SODIUM CHLORIDE 0.9% FLUSH
3.0000 mL | Freq: Once | INTRAVENOUS | Status: AC
  Administered 2020-03-18: 3 mL via INTRAVENOUS

## 2020-03-18 NOTE — ED Triage Notes (Signed)
Pt here today stating that her heart is racing but does not want [place] to do an EKG , pt also has bil feet swelling which she was seen  for on 7/15

## 2020-03-18 NOTE — ED Provider Notes (Signed)
[place] EMERGENCY DEPARTMENT Provider Note   CSN: [id number] Arrival date & time: 03/18/20  1020     History No chief complaint on file.   Valerie Howard is a 44 y.o. female.  HPI Patient presents with heart racing generalized weakness and swelling in her feet.  Seen about a week ago for the same.  History of anemia due to heavy menses.  States that she feels shaky.  History of iron deficiency anemia with hemoglobin is gone down as low as 2.  Not on medicine as an outpatient.  Patient states she needs to know how to stop this.  States she has continued her menses that started last week.  States she is bleeding heavier now than she should be at this time and her menses.  States she was not given any medicine to help stop this.  Reviewing records she was offered Megace but refused it.  Patient states she was offered Metformin.  Patient reportedly is homeless to.  Unsure if she will want to come into the hospital. Patient had refused EKG and had refused to change into a gown.    Past Medical History:  Diagnosis Date  . History of radiation therapy 07/18/17-07/24/17   right ear, operative bed 12 Gy in 3 fractions  . Keloid    right ear  . Paranoid behavior ([place])   . Schizophrenia [facility])     Patient Active Problem List   Diagnosis Date Noted  . Paranoia (psychosis) ([place])   . Acute blood loss anemia 02/27/2019  . 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 Schizoaffective disorder, bipolar type ([place])   . Menorrhagia 05/12/2014  . Personality disorder ([place]) 10/17/2012  . Anemia, iron deficiency 10/13/2012  . Tachycardia 10/01/2012    Past Surgical History:  Procedure Laterality Date  . CESAREAN SECTION    . KENALOG INJECTION Bilateral 07/17/2017   Procedure: KENALOG INJECTION;  Surgeon: Wallace Going, DO;  Location: [place];  Service: Plastics;  Laterality: Bilateral;  . MASS EXCISION N/A 07/17/2017   Procedure: EXCISION KELOID OF RIGHT EAR WITH  INTROPERATIVE KENALOG INJECTION AND POST OP RADIATION;  Surgeon: Wallace Going, DO;  Location: [place];  Service: Plastics;  Laterality: N/A;  . TUBAL LIGATION       OB History   No obstetric history on file.     Family History  Problem Relation Age of Onset  . Hypertension Other   . Diabetes Other   . Cancer Other   . Keloids Mother   . Breast cancer Maternal Grandmother     Social History   Tobacco Use  . Smoking status: Never Smoker  . Smokeless tobacco: Never Used  Vaping Use  . Vaping Use: Never used  Substance Use Topics  . Alcohol use: Yes    Comment: socially  . Drug use: No    Home Medications Prior to Admission medications   Medication Sig Start Date End Date Taking? Authorizing Provider  Ferrous Sulfate (IRON PO) Take 1 tablet by mouth daily. Patient not taking: Reported on 02/24/2020    [provider]    Allergies    Penicillins  Review of Systems   Review of Systems  Constitutional: Negative for appetite change.  HENT: Negative for congestion.   Respiratory: Positive for shortness of breath.   Cardiovascular: Positive for leg swelling.  Gastrointestinal: Negative for abdominal pain.  Musculoskeletal: Negative for back pain.  Skin: Negative for rash.  Neurological: Positive for weakness.  Psychiatric/Behavioral: Negative for confusion.    Physical Exam Updated Vital Signs BP 132/81 (BP Location: Left Arm)   Pulse (!) 108   Temp 98.9 F (37.2 C) (Oral)   Resp 17   LMP 03/12/2020   SpO2 100%   Physical Exam Vitals reviewed.  HENT:     Head: Normocephalic.  Cardiovascular:     Rate and Rhythm: Tachycardia present.  Pulmonary:     Effort: Pulmonary effort is normal.  Abdominal:     Tenderness: There is no abdominal tenderness.  Musculoskeletal:     Cervical back: Neck supple.  Skin:    Coloration: Skin is pale.  Neurological:     Mental Status: She is alert and oriented to person, place, and time.      ED Results / Procedures / Treatments   Labs (all labs ordered are listed, but only abnormal results are displayed) Labs Reviewed  BASIC METABOLIC PANEL - Abnormal; Notable for the following components:      Result Value   Glucose, Bld 130 (*)    Calcium 8.7 (*)    All other components within normal limits  CBC - Abnormal; Notable for the following components:   RBC 2.93 (*)    Hemoglobin 6.1 (*)    HCT 23.0 (*)    MCV 78.5 (*)    MCH 20.8 (*)    MCHC 26.5 (*)    RDW 24.6 (*)    All other components within normal limits  TROPONIN I (HIGH SENSITIVITY)  TROPONIN I (HIGH SENSITIVITY)    EKG None  Radiology DG Chest 2 View  Result Date: 03/18/2020 CLINICAL DATA:  Chest pain EXAM: CHEST - 2 VIEW COMPARISON:  03/12/2020. FINDINGS: The heart size and mediastinal contours are within normal limits. Both lungs are clear. No pleural effusion or pneumothorax. The visualized skeletal structures are unremarkable. IMPRESSION: No acute process in the chest. Electronically Signed   By: Macy Mis M.D.   On: 03/18/2020 11:39    Procedures Procedures (including critical care time)  Medications Ordered in ED Medications  sodium chloride flush (NS) 0.9 % injection 3 mL (has no administration in time range)    ED Course  I have reviewed the triage vital signs and the nursing notes.  Pertinent labs & imaging results that were available during my care of the patient were reviewed by me and considered in my medical decision making (see chart for details).    MDM Rules/Calculators/A&P                          Patient presents with weakness shaking and some swelling. Recurrent anemia. Hemoglobin down to 6.1. He has had recent transfusions and has been transfused in the sixes. Not on any medicine to help stop from heavy menses which are the cause and unsure if she is been taking iron. However she is symptomatic with worsening shortness of breath. States she will get transfusion. She is  at high risk of leaving however. Will discuss with hospitalist. Patient somewhat uncooperative and that she will not change out of her close and did not allow an EKG. Final Clinical Impression(s) / ED Diagnoses Final diagnoses:  Iron deficiency anemia, unspecified iron deficiency anemia type    Rx / DC Orders ED Discharge Orders    None       Davonna Belling, MD 03/18/20 [phone]

## 2020-03-18 NOTE — ED Notes (Signed)
Pt did not receive blood transfusion the ED. Per blood bank, pt has antibodies in her blood and it would take longer to prepare. Rn on the floor made aware. Consent for blood transfusion sent to floor with pt.

## 2020-03-18 NOTE — H&P (Addendum)
History and Physical    Valerie Howard QMG:[id number] DOB: 1975-11-28 DOA: 03/18/2020  PCP: Patient, No Pcp Per Patient coming from:   I have personally briefly reviewed patient's old medical records in [place]  Chief Complaint: feeling weak, dyspnea on exertion   HPI: Valerie Howard is a 44 y.o. female with medical history significant of  Iron deficiency anemia due to menorrhagia, schizoaffective disorder ,bipolar type, medical noncompliance, presented ED complaining of feeling weak, found to have anemia.  She was hospitalized twice in June for the same, required blood transfusion, GYN recommended Megace and iron supplement, she has not been taking it. She also report bilateral lower extremity edema.   ED Course: Blood pressure 132/81, pulse (!) 108, temperature 98.9 F (37.2 C), temperature source Oral, resp. rate 17, last menstrual period 03/12/2020, SpO2 100 %.  Hemoglobin 6.1, creatinine 0.95 High-sensitivity troponin negative Chest x-ray no acute findings SARS-CoV-2 screening ordered , not collected yet   Hospitalist called to admit the patient, I have requested bilateral lower extremity ultrasound, and 2 units of prbc transfusion to be started in the ED.  Review of Systems: As per HPI otherwise all other systems reviewed and are negative.   Past Medical History:  Diagnosis Date  . History of radiation therapy 07/18/17-07/24/17   right ear, operative bed 12 Gy in 3 fractions  . Keloid    right ear  . Paranoid behavior ([place])   . Schizophrenia [facility])     Past Surgical History:  Procedure Laterality Date  . CESAREAN SECTION    . KENALOG INJECTION Bilateral 07/17/2017   Procedure: KENALOG INJECTION;  Surgeon: Wallace Going, DO;  Location: [place];  Service: Plastics;  Laterality: Bilateral;  . MASS EXCISION N/A 07/17/2017   Procedure: EXCISION KELOID OF RIGHT EAR WITH INTROPERATIVE KENALOG INJECTION AND POST OP RADIATION;  Surgeon:  Wallace Going, DO;  Location: [place];  Service: Plastics;  Laterality: N/A;  . TUBAL LIGATION      Social History  reports that she has never smoked. She has never used smokeless tobacco. She reports current alcohol use. She reports that she does not use drugs.  Allergies  Allergen Reactions  . Penicillins Hives and Itching    Has patient had a PCN reaction causing immediate rash, facial/tongue/throat swelling, SOB or lightheadedness with hypotension:Patient refuses to answer (PRA) Has patient had a PCN reaction causing severe rash involving mucus membranes or skin necrosis:PRA Has patient had a PCN reaction that required hospitalization PRA Has patient had a PCN reaction occurring within the last 10 years:PRA If all of the above answers are "NO", then may proceed with Cephalosporin use.     Family History  Problem Relation Age of Onset  . Hypertension Other   . Diabetes Other   . Cancer Other   . Keloids Mother   . Breast cancer Maternal Grandmother     Prior to Admission medications   Medication Sig Start Date End Date Taking? Authorizing Provider  Ferrous Sulfate (IRON PO) Take 1 tablet by mouth daily. Patient not taking: Reported on 02/24/2020    [provider]    Physical Exam: Vitals:   03/18/20 1040 03/18/20 1348  BP: 134/83 132/81  Pulse: (!) 138 (!) 108  Resp:  17  Temp: 98.9 F (37.2 C)   TempSrc: Oral   SpO2: 100% 100%    Constitutional: NAD, calm, comfortable Eyes: PERRL, lids and conjunctivae normal ENMT: Mucous membranes are moist.  Respiratory: clear to auscultation bilaterally, no wheezing, no crackles. Normal respiratory effort. No accessory muscle use.  Cardiovascular: Sinus tachycardia , 2+ pedal pulses. No carotid bruits.  Abdomen: no tenderness, not distended, Bowel sounds positive.  Musculoskeletal: Bilateral lower extremity edema Skin: no rashes, lesions, ulcers. No induration Neurologic: CN 2-12 grossly  intact. Sensation intact, Strength 5/5 in all 4.  Psychiatric: flat affect, paranoia,  Alert and oriented x 3.    Labs on Admission: I have personally reviewed following labs and imaging studies  CBC: Recent Labs  Lab 03/12/20 1203 03/18/20 1110  WBC 4.9 5.2  HGB 7.8* 6.1*  HCT 28.9* 23.0*  MCV 76.5* 78.5*  PLT 401* 428    Basic Metabolic Panel: Recent Labs  Lab 03/12/20 1203 03/18/20 1110  NA 141 141  K 3.9 3.8  CL 107 109  CO2 24 23  GLUCOSE 80 130*  BUN 14 13  CREATININE 0.85 0.95  CALCIUM 8.9 8.7*    GFR: CrCl cannot be calculated (Unknown ideal weight.).  Liver Function Tests: No results for input(s): AST, ALT, ALKPHOS, BILITOT, PROT, ALBUMIN in the last 168 hours.  Urine analysis:    Component Value Date/Time   COLORURINE YELLOW 01/31/2020 1027   APPEARANCEUR CLEAR 01/31/2020 1027   LABSPEC 1.018 01/31/2020 1027   PHURINE 5.0 01/31/2020 1027   GLUCOSEU NEGATIVE 01/31/2020 1027   HGBUR SMALL (A) 01/31/2020 1027   BILIRUBINUR NEGATIVE 01/31/2020 1027   KETONESUR 5 (A) 01/31/2020 1027   PROTEINUR NEGATIVE 01/31/2020 1027   UROBILINOGEN 0.2 12/06/2011 0034   NITRITE NEGATIVE 01/31/2020 1027   LEUKOCYTESUR NEGATIVE 01/31/2020 1027    Radiological Exams on Admission: DG Chest 2 View  Result Date: 03/18/2020 CLINICAL DATA:  Chest pain EXAM: CHEST - 2 VIEW COMPARISON:  03/12/2020. FINDINGS: The heart size and mediastinal contours are within normal limits. Both lungs are clear. No pleural effusion or pneumothorax. The visualized skeletal structures are unremarkable. IMPRESSION: No acute process in the chest. Electronically Signed   By: Macy Mis M.D.   On: 03/18/2020 11:39      Assessment/Plan Active Problems:   Symptomatic anemia   Symptomatic anemia, acute on chronic blood loss anemia due to menorrhagia, medical noncompliance -Hemoglobin 6 , transfuse 2 unit ( addendum: EDP did not order, I obtained consent and placed order) -Repeat CBC  posttransfusion -she was given iron by IV iron on 6/25 ,  Will give another dose after prbc transfusion .  -she agrees to prbc transfusion and iv iron, she declined gyn consult.  Bilateral lower extremity edema Bilateral Venous ultrasound pending  H/o schizophrenia,  paranoid behavioral tendencies She was evaluated by psychiatry on multiple occasions Per most recent discharge summary" Apparently does well with Haldol though she frequently refuses meds Unfortunately she has declined to take any of her psychiatric medications and therefore none will be prescribed at discharge" She declined psychiatry consult today  History of homelessness Will consult social worker   DVT prophylaxis: scd's   Code Status:   full Family Communication:  She agreed me calling daughter Otilio Connors , call went to voice mailx2  Patient is from: Reports being homeless, does not give details   Anticipated DC to:  Needs social worker consult  Anticipated DC date:  Likely tomorrow   Consults called:  none Admission status:  observation   Voice Recognition /Dragon dictation system was used to create this note, attempts have been made to correct errors. Please contact the author with questions and/or clarifications.  Florencia Reasons  MD PhD FACP [facility]  How to contact the [facility] Attending or Consulting provider [place] or covering provider during after hours [place], for this patient?   1. Check the care team in [facility] and look for a) attending/consulting [facility] provider listed and b) the [facility] team listed 2. Log into [email or web address] and use [place]'s universal password to access. If you do not have the password, please contact the hospital operator. 3. Locate the [facility] provider you are looking for under [facility] and page to a number that you can be directly reached. 4. If you still have difficulty reaching the provider, please page the [facility] (Director on Call) for the Hospitalists listed on amion for  assistance.  03/18/2020, 4:41 PM

## 2020-03-19 ENCOUNTER — Observation Stay (HOSPITAL_BASED_OUTPATIENT_CLINIC_OR_DEPARTMENT_OTHER): Payer: Medicaid Other

## 2020-03-19 DIAGNOSIS — M7989 Other specified soft tissue disorders: Secondary | ICD-10-CM

## 2020-03-19 DIAGNOSIS — D649 Anemia, unspecified: Secondary | ICD-10-CM | POA: Diagnosis not present

## 2020-03-19 LAB — CBC
HCT: 27.5 % — ABNORMAL LOW (ref 36.0–46.0)
Hemoglobin: 8.1 g/dL — ABNORMAL LOW (ref 12.0–15.0)
MCH: 22.8 pg — ABNORMAL LOW (ref 26.0–34.0)
MCHC: 29.5 g/dL — ABNORMAL LOW (ref 30.0–36.0)
MCV: 77.2 fL — ABNORMAL LOW (ref 80.0–100.0)
Platelets: 282 10*3/uL (ref 150–400)
RBC: 3.56 MIL/uL — ABNORMAL LOW (ref 3.87–5.11)
RDW: 22.5 % — ABNORMAL HIGH (ref 11.5–15.5)
WBC: 6.5 10*3/uL (ref 4.0–10.5)
nRBC: 0.5 % — ABNORMAL HIGH (ref 0.0–0.2)

## 2020-03-19 MED ORDER — BISACODYL 5 MG PO TBEC: 5.0000 mg | DELAYED_RELEASE_TABLET | Freq: Every day | ORAL | Status: DC | PRN

## 2020-03-19 MED ORDER — FERROUS SULFATE 325 (65 FE) MG PO TABS: 325.0000 mg | ORAL_TABLET | Freq: Every day | ORAL | 0 refills | Status: DC

## 2020-03-19 MED ORDER — SENNOSIDES-DOCUSATE SODIUM 8.6-50 MG PO TABS: 2.0000 | ORAL_TABLET | Freq: Two times a day (BID) | ORAL | Status: DC

## 2020-03-19 MED ORDER — POLYETHYLENE GLYCOL 3350 17 G PO PACK: 17.0000 g | PACK | Freq: Every day | ORAL | Status: DC

## 2020-03-19 MED ORDER — SENNOSIDES-DOCUSATE SODIUM 8.6-50 MG PO TABS: 2.0000 | ORAL_TABLET | Freq: Two times a day (BID) | ORAL | 0 refills | Status: DC

## 2020-03-19 MED ORDER — POLYETHYLENE GLYCOL 3350 17 G PO PACK: 17.0000 g | PACK | Freq: Every day | ORAL | 0 refills | Status: DC

## 2020-03-19 MED ORDER — FERROUS SULFATE 325 (65 FE) MG PO TABS
325.0000 mg | ORAL_TABLET | Freq: Every day | ORAL | Status: DC
  Administered 2020-03-19: 325 mg via ORAL
  Filled 2020-03-19: qty 1

## 2020-03-19 NOTE — Discharge Instructions (Signed)
Iron Deficiency Anemia, Adult Iron-deficiency anemia is when you have a low amount of red blood cells or hemoglobin. This happens because you have too little iron in your body. Hemoglobin carries oxygen to parts of the body. Anemia can cause your body to not get enough oxygen. It may or may not cause symptoms. Follow these instructions at home: Medicines  Take over-the-counter and prescription medicines only as told by your doctor. This includes iron pills (supplements) and vitamins.  If you cannot handle taking iron pills by mouth, ask your doctor about getting iron through: ? A vein (intravenously). ? A shot (injection) into a muscle.  Take iron pills when your stomach is empty. If you cannot handle this, take them with food.  Do not drink milk or take antacids at the same time as your iron pills.  To prevent trouble pooping (constipation), eat fiber or take medicine (stool softener) as told by your doctor. Eating and drinking   Talk with your doctor before changing the foods you eat. He or she may tell you to eat foods that have a lot of iron, such as: ? Liver. ? Lowfat (lean) beef. ? Breads and cereals that have iron added to them (fortified breads and cereals). ? Eggs. ? Dried fruit. ? Dark green, leafy vegetables.  Drink enough fluid to keep your pee (urine) clear or pale yellow.  Eat fresh fruits and vegetables that are high in vitamin C. They help your body to use iron. Foods with a lot of vitamin C include: ? Oranges. ? Peppers. ? Tomatoes. ? Mangoes. General instructions  Return to your normal activities as told by your doctor. Ask your doctor what activities are safe for you.  Keep yourself clean, and keep things clean around you (your surroundings). Anemia can make you get sick more easily.  Keep all follow-up visits as told by your doctor. This is important. Contact a doctor if:  You feel sick to your stomach (nauseous).  You throw up (vomit).  You feel  weak.  You are sweating for no clear reason.  You have trouble pooping, such as: ? Pooping (having a bowel movement) less than 3 times a week. ? Straining to poop. ? Having poop that is hard, dry, or larger than normal. ? Feeling full or bloated. ? Pain in the lower belly. ? Not feeling better after pooping. Get help right away if:  You pass out (faint). If this happens, do not drive yourself to the hospital. Call your local emergency services (911 in the [place]  You have chest pain.  You have shortness of breath that: ? Is very bad. ? Gets worse with physical activity.  You have a fast heartbeat.  You get light-headed when getting up from sitting or lying down. This information is not intended to replace advice given to you by your health care provider. Make sure you discuss any questions you have with your health care provider. Document Revised: 07/28/2017 Document Reviewed: 05/04/2016 Elsevier Patient Education  [place].  Menorrhagia Menorrhagia is when your menstrual periods are heavy or last longer than normal. Follow these instructions at home: Medicines   Take over-the-counter and prescription medicines exactly as told by your doctor. This includes iron pills.  Do not change or switch medicines without asking your doctor.  Do not take aspirin or medicines that contain aspirin 1 week before or during your period. Aspirin may make bleeding worse. General instructions  If you need to change your pad or tampon  more than once every 2 hours, limit your activity until the bleeding stops.  Iron pills can cause problems when pooping (constipation). To prevent or treat pooping problems while taking prescription iron pills, your doctor may suggest that you: ? Drink enough fluid to keep your pee (urine) clear or pale yellow. ? Take over-the-counter or prescription medicines. ? Eat foods that are high in fiber. These foods include:  Fresh fruits and  vegetables.  Whole grains.  Beans. ? Limit foods that are high in fat and processed sugars. This includes fried and sweet foods.  Eat healthy meals and foods that are high in iron. Foods that have a lot of iron include: ? Leafy green vegetables. ? Meat. ? Liver. ? Eggs. ? Whole grain breads and cereals.  Do not try to lose weight until your heavy bleeding has stopped and you have normal amounts of iron in your blood. If you need to lose weight, work with your doctor.  Keep all follow-up visits as told by your doctor. This is important. Contact a doctor if:  You soak through a pad or tampon every 1 or 2 hours, and this happens every time you have a period.  You need to use pads and tampons at the same time because you are bleeding so much.  You are taking medicine and you: ? Feel sick to your stomach (nauseous). ? Throw up (vomit). ? Have watery poop (diarrhea).  You have other problems that may be related to the medicine you are taking. Get help right away if:  You soak through more than a pad or tampon in 1 hour.  You pass clots bigger than 1 inch (2.5 cm) wide.  You feel short of breath.  You feel like your heart is beating too fast.  You feel dizzy or you pass out (faint).  You feel very weak or tired. Summary  Menorrhagia is when your menstrual periods are heavy or last longer than normal.  Take over-the-counter and prescription medicines exactly as told by your doctor. This includes iron pills.  Contact a doctor if you soak through more than a pad or tampon in 1 hour or are passing large clots. This information is not intended to replace advice given to you by your health care provider. Make sure you discuss any questions you have with your health care provider. Document Revised: 11/22/2017 Document Reviewed: 09/05/2016 Elsevier Patient Education  [place].

## 2020-03-19 NOTE — Progress Notes (Signed)
Discharge teaching provided. Meds, diet, activity, follow up appointments reviewed and all questions answered. Copy of instructions given to patient and prescriptions sent to pharmacy.

## 2020-03-19 NOTE — Social Work (Signed)
Acknowledging consult for homelessness, per chart review pt has declined assistance packets/resources from [facility] team >5 times. She has a documented hx of cursing at and being verbally inappropriate towards members of our team. I let RN, RNCM and MD know that I am available to assist with bus pass/cab voucher/resources if specifically requested by pt.    Westley Hummer, MSW, [place] [facility]

## 2020-03-19 NOTE — Discharge Summary (Signed)
Discharge Summary  Valerie Howard YKZ:[id number] DOB: 05-09-1976  PCP: Patient, No Pcp Per  Admit date: 03/18/2020 Discharge date: 03/19/2020  Time spent: 35 minutes  Recommendations for Outpatient Follow-up:  1. Follow-up with your PCP 2. Follow-up with gynecology 3. Follow-up with psychiatry  Discharge Diagnoses:  Active Hospital Problems   Diagnosis Date Noted  . Symptomatic anemia 03/18/2020    Resolved Hospital Problems  No resolved problems to display.    Discharge Condition: Stable  Diet recommendation: Resume previous diet  Vitals:   03/19/20 0536 03/19/20 0748  BP: 115/76 120/84  Pulse: 83 81  Resp: 18 18  Temp: 98.3 F (36.8 C) 98.2 F (36.8 C)  SpO2:  100%    History of present illness:  HPI: Valerie Howard is a 44 y.o. 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  She was hospitalized twice in June for the same, required blood transfusion, GYN recommended Megace and iron supplement, she has not been taking. She also report bilateral lower extremity edema.  Bilateral duplex ultrasound ordered.  2 unit PRBC ordered to be transfused.  TRH, hospitalist team, was asked to admit.  03/19/20: Seen and examined.  No new complaints.  Received IV Feraheme 510 mg x 1 on 03/18/2020.  Completed transfusion of 2 units PRBC this morning, awaiting repeat H&H.  Patient advised to take an iron supplement daily.  Hospital Course:  Active Problems:   Symptomatic anemia  Symptomatic anemia, acute on chronic blood loss anemia due to menorrhagia, medical noncompliance Reports having her menses every 2 weeks. Presented with hemoglobin 6  2 units PRBCs transfused Awaiting repeat H&H Advised to take an aloe supplement daily received 1 dose of IV Feraheme 510 mg x 1. Follow-up with your PCP    She was given iron by IV iron on 6/25 She declined gyn consult.  Bilateral lower extremity edema Bilateral Venous ultrasound, no evidence of DVT bilaterally. Elevate lower extremities at rest  H/o schizophrenia, paranoid behavioral tendencies She was evaluated by psychiatry on multiple occasions Per most recent discharge summary" Apparently does well with Haldol though she frequently refuses meds Unfortunately she has declined to take any of her psychiatric medications and therefore none will be prescribed at discharge" She declined psychiatry consult  Chronic constipation Denies nausea or vomiting Tolerating a diet, no abdominal pain. Declines enema Laxative and stool softener ordered Follow-up with your PCP  History of homelessness Consulted Education officer, museum    Code Status:              full   Procedures:  PRBC transfusion  Consultations:  None  Discharge Exam: BP 120/84   Pulse 81   Temp 98.2 F (36.8 C) (Oral)   Resp 18   LMP 03/12/2020   SpO2 100%  . General: 44 y.o. year-old female well developed well nourished in no acute distress.  Alert and oriented x3. . Cardiovascular: Regular rate and rhythm with no rubs or gallops.  No thyromegaly or JVD noted.   [name] Respiratory: Clear to auscultation with no wheezes or rales. Good inspiratory effort. . Abdomen: Soft nontender nondistended with normal bowel sounds x4 quadrants. . Musculoskeletal: Trace lower extremity edema bilaterally. [name] Psychiatry: Mood is appropriate for condition and setting  Discharge Instructions You were cared for by a hospitalist during your hospital stay. If you have any questions  about your discharge medications or the care you received while you were in the hospital after you are discharged, you can call the unit and asked to speak with the hospitalist on call if the hospitalist that took care of you is not available. Once you are discharged, your primary care physician will handle any  further medical issues. Please note that NO REFILLS for any discharge medications will be authorized once you are discharged, as it is imperative that you return to your primary care physician (or establish a relationship with a primary care physician if you do not have one) for your aftercare needs so that they can reassess your need for medications and monitor your lab values.   Allergies as of 03/19/2020      Reactions   Penicillins Hives, Itching   Has patient had a PCN reaction causing immediate rash, facial/tongue/throat swelling, SOB or lightheadedness with hypotension:Patient refuses to answer (PRA) Has patient had a PCN reaction causing severe rash involving mucus membranes or skin necrosis:PRA Has patient had a PCN reaction that required hospitalization PRA Has patient had a PCN reaction occurring within the last 10 years:PRA If all of the above answers are "NO", then may proceed with Cephalosporin use.      Medication List    TAKE these medications   ferrous sulfate 325 (65 FE) MG tablet Take 1 tablet (325 mg total) by mouth daily with breakfast. What changed:   medication strength  how much to take  when to take this   polyethylene glycol 17 g packet Commonly known as: MIRALAX / GLYCOLAX Take 17 g by mouth daily.   senna-docusate 8.6-50 MG tablet Commonly known as: Senokot-S Take 2 tablets by mouth 2 (two) times daily.      Allergies  Allergen Reactions  . Penicillins Hives and Itching    Has patient had a PCN reaction causing immediate rash, facial/tongue/throat swelling, SOB or lightheadedness with hypotension:Patient refuses to answer (PRA) Has patient had a PCN reaction causing severe rash involving mucus membranes or skin necrosis:PRA Has patient had a PCN reaction that required hospitalization PRA Has patient had a PCN reaction occurring within the last 10 years:PRA If all of the above answers are "NO", then may proceed with Cephalosporin use.      Follow-up Information    [facility]. Call in 1 day(s).   Why: [facility] call for a post hospital follow up appointment Contact information: [address] [place] [address] [phone]               The results of significant diagnostics from this hospitalization (including imaging, microbiology, ancillary and laboratory) are listed below for reference.    Significant Diagnostic Studies: DG Chest 2 View  Result Date: 03/18/2020 CLINICAL DATA:  Chest pain EXAM: CHEST - 2 VIEW COMPARISON:  03/12/2020. FINDINGS: The heart size and mediastinal contours are within normal limits. Both lungs are clear. No pleural effusion or pneumothorax. The visualized skeletal structures are unremarkable. IMPRESSION: No acute process in the chest. Electronically Signed   By: Macy Mis M.D.   On: 03/18/2020 11:39   DG Chest 2 View  Result Date: 03/12/2020 CLINICAL DATA:  Heart racing and bilateral leg swelling. EXAM: CHEST - 2 VIEW COMPARISON:  01/31/2020 FINDINGS: The cardiac silhouette, mediastinal and hilar contours are within normal limits. The lungs are clear. No pleural effusions or pulmonary lesions. The bony thorax is intact. IMPRESSION: No acute cardiopulmonary findings. Electronically Signed   By:  Marijo Sanes M.D.   On: 03/12/2020 12:15   VAS [place] LOWER EXTREMITY VENOUS (DVT) (ONLY [facility] & [facility])  Result Date: 03/19/2020  Lower Venous DVTStudy Indications: Swelling in ankles and feet.  Comparison Study: No prior studies. Performing Technologist: Darlin Coco  Examination Guidelines: A complete evaluation includes B-mode imaging, spectral Doppler, color Doppler, and power Doppler as needed of all accessible portions of each vessel. Bilateral testing is considered an integral part of a complete examination. Limited examinations for reoccurring indications may be performed as noted. The reflux portion of the exam is performed with the patient in reverse  Trendelenburg.  +---------+---------------+---------+-----------+----------+--------------+ RIGHT    CompressibilityPhasicitySpontaneityPropertiesThrombus Aging +---------+---------------+---------+-----------+----------+--------------+ CFV      Full           Yes      Yes                                 +---------+---------------+---------+-----------+----------+--------------+ SFJ      Full                                                        +---------+---------------+---------+-----------+----------+--------------+ FV Prox  Full                                                        +---------+---------------+---------+-----------+----------+--------------+ FV Mid   Full                                                        +---------+---------------+---------+-----------+----------+--------------+ FV DistalFull                                                        +---------+---------------+---------+-----------+----------+--------------+ PFV      Full                                                        +---------+---------------+---------+-----------+----------+--------------+ POP      Full           Yes      Yes                                 +---------+---------------+---------+-----------+----------+--------------+ PTV      Full                                                        +---------+---------------+---------+-----------+----------+--------------+ PERO  Full                                                        +---------+---------------+---------+-----------+----------+--------------+   +---------+---------------+---------+-----------+----------+--------------+ LEFT     CompressibilityPhasicitySpontaneityPropertiesThrombus Aging +---------+---------------+---------+-----------+----------+--------------+ CFV      Full           Yes      Yes                                  +---------+---------------+---------+-----------+----------+--------------+ SFJ      Full                                                        +---------+---------------+---------+-----------+----------+--------------+ FV Prox  Full                                                        +---------+---------------+---------+-----------+----------+--------------+ FV Mid   Full                                                        +---------+---------------+---------+-----------+----------+--------------+ FV DistalFull                                                        +---------+---------------+---------+-----------+----------+--------------+ PFV      Full                                                        +---------+---------------+---------+-----------+----------+--------------+ POP      Full           Yes      Yes                                 +---------+---------------+---------+-----------+----------+--------------+ PTV      Full                                                        +---------+---------------+---------+-----------+----------+--------------+ PERO     Full                                                        +---------+---------------+---------+-----------+----------+--------------+  Summary: RIGHT: - There is no evidence of deep vein thrombosis in the lower extremity.  - No cystic structure found in the popliteal fossa.  LEFT: - There is no evidence of deep vein thrombosis in the lower extremity.  - No cystic structure found in the popliteal fossa.  *See table(s) above for measurements and observations.    Preliminary     Microbiology: Recent Results (from the past 240 hour(s))  SARS Coronavirus 2 by RT PCR (hospital order, performed in [facility] hospital lab) Nasopharyngeal Nasopharyngeal Swab     Status: None   Collection Time: 03/18/20  5:01 PM   Specimen: Nasopharyngeal Swab  Result Value Ref Range Status    SARS Coronavirus 2 NEGATIVE NEGATIVE Final    Comment: (NOTE) SARS-CoV-2 target nucleic acids are NOT DETECTED.  The SARS-CoV-2 RNA is generally detectable in upper and lower respiratory specimens during the acute phase of infection. The lowest concentration of SARS-CoV-2 viral copies this assay can detect is 250 copies / mL. A negative result does not preclude SARS-CoV-2 infection and should not be used as the sole basis for treatment or other patient management decisions.  A negative result may occur with improper specimen collection / handling, submission of specimen other than nasopharyngeal swab, presence of viral mutation(s) within the areas targeted by this assay, and inadequate number of viral copies (<250 copies / mL). A negative result must be combined with clinical observations, patient history, and epidemiological information.  Fact Sheet for Patients:   StrictlyIdeas.no  Fact Sheet for Healthcare Providers: BankingDealers.co.za  This test is not yet approved or  cleared by the [place] FDA and has been authorized for detection and/or diagnosis of SARS-CoV-2 by FDA under an Emergency Use Authorization (EUA).  This EUA will remain in effect (meaning this test can be used) for the duration of the COVID-19 declaration under Section 564(b)(1) of the Act, 21 U.S.C. section 360bbb-3(b)(1), unless the authorization is terminated or revoked sooner.  Performed at [facility], [place] [address], [place], [place] [address]      Labs: Basic Metabolic Panel: Recent Labs  Lab 03/12/20 1203 03/18/20 1110  NA 141 141  K 3.9 3.8  CL 107 109  CO2 24 23  GLUCOSE 80 130*  BUN 14 13  CREATININE 0.85 0.95  CALCIUM 8.9 8.7*   Liver Function Tests: No results for input(s): AST, ALT, ALKPHOS, BILITOT, PROT, ALBUMIN in the last 168 hours. No results for input(s): LIPASE, AMYLASE in the last 168 hours. No results for  input(s): AMMONIA in the last 168 hours. CBC: Recent Labs  Lab 03/12/20 1203 03/18/20 1110  WBC 4.9 5.2  HGB 7.8* 6.1*  HCT 28.9* 23.0*  MCV 76.5* 78.5*  PLT 401* 272   Cardiac Enzymes: No results for input(s): CKTOTAL, CKMB, CKMBINDEX, TROPONINI in the last 168 hours. BNP: BNP (last 3 results) No results for input(s): BNP in the last 8760 hours.  ProBNP (last 3 results) No results for input(s): PROBNP in the last 8760 hours.  CBG: No results for input(s): GLUCAP in the last 168 hours.     Signed:  [name], MD [facility] 03/19/2020, 11:15 AM

## 2020-03-20 LAB — BPAM RBC
Blood Product Expiration Date: 202108032359
Blood Product Expiration Date: 202108162359
ISSUE DATE / TIME: 202107220213
ISSUE DATE / TIME: 202107220507
Unit Type and Rh: 5100
Unit Type and Rh: 9500

## 2020-03-20 LAB — TYPE AND SCREEN
ABO/RH(D): O POS
Antibody Screen: POSITIVE
DAT, IgG: POSITIVE
Donor AG Type: NEGATIVE
Donor AG Type: NEGATIVE
Unit division: 0
Unit division: 0

## 2020-04-06 ENCOUNTER — Emergency Department (HOSPITAL_COMMUNITY)
Admission: EM | Admit: 2020-04-06 | Discharge: 2020-04-06 | Disposition: A | Discharge status: Home / Self Care | Payer: Medicaid Other | Attending: Emergency Medicine | Admitting: Emergency Medicine

## 2020-04-06 ENCOUNTER — Other Ambulatory Visit: Payer: Self-pay

## 2020-04-06 ENCOUNTER — Encounter (HOSPITAL_COMMUNITY): Payer: Self-pay | Admitting: Emergency Medicine

## 2020-04-06 DIAGNOSIS — N939 Abnormal uterine and vaginal bleeding, unspecified: Secondary | ICD-10-CM

## 2020-04-06 LAB — COMPREHENSIVE METABOLIC PANEL
ALT: 15 U/L (ref 0–44)
AST: 15 U/L (ref 15–41)
Albumin: 3.9 g/dL (ref 3.5–5.0)
Alkaline Phosphatase: 51 U/L (ref 38–126)
Anion gap: 10 (ref 5–15)
BUN: 11 mg/dL (ref 6–20)
CO2: 25 mmol/L (ref 22–32)
Calcium: 9.1 mg/dL (ref 8.9–10.3)
Chloride: 104 mmol/L (ref 98–111)
Creatinine, Ser: 0.87 mg/dL (ref 0.44–1.00)
GFR calc Af Amer: >60 mL/min (ref >60)
GFR calc non Af Amer: >60 mL/min (ref >60)
Glucose, Bld: 91 mg/dL (ref 70–99)
Potassium: 3.8 mmol/L (ref 3.5–5.1)
Sodium: 139 mmol/L (ref 135–145)
Total Bilirubin: 0.6 mg/dL (ref 0.3–1.2)
Total Protein: 7.5 g/dL (ref 6.5–8.1)

## 2020-04-06 LAB — TYPE AND SCREEN
ABO/RH(D): O POS
Antibody Screen: POSITIVE

## 2020-04-06 LAB — I-STAT BETA HCG BLOOD, ED (MC, WL, AP ONLY): I-stat hCG, quantitative: <5 m[IU]/mL (ref <5)

## 2020-04-06 LAB — CBC
HCT: 38 % (ref 36.0–46.0)
Hemoglobin: 10.6 g/dL — ABNORMAL LOW (ref 12.0–15.0)
MCH: 23.8 pg — ABNORMAL LOW (ref 26.0–34.0)
MCHC: 27.9 g/dL — ABNORMAL LOW (ref 30.0–36.0)
MCV: 85.4 fL (ref 80.0–100.0)
Platelets: 462 10*3/uL — ABNORMAL HIGH (ref 150–400)
RBC: 4.45 MIL/uL (ref 3.87–5.11)
RDW: 26 % — ABNORMAL HIGH (ref 11.5–15.5)
WBC: 5.4 10*3/uL (ref 4.0–10.5)
nRBC: 0 % (ref 0.0–0.2)

## 2020-04-06 MED ORDER — MEDROXYPROGESTERONE ACETATE 10 MG PO TABS
5.0000 mg | ORAL_TABLET | Freq: Every day | ORAL | 0 refills | Status: DC
Start: 2020-04-06 — End: 2020-05-04

## 2020-04-06 MED ORDER — FERROUS SULFATE 325 (65 FE) MG PO TABS
325.0000 mg | ORAL_TABLET | Freq: Two times a day (BID) | ORAL | 0 refills | Status: DC
Start: 2020-04-06 — End: 2020-05-04

## 2020-04-06 NOTE — ED Notes (Signed)
Patient verbalizes understanding of discharge instructions . Opportunity for questions and answers were provided . Armband removed by staff ,Pt discharged from ED. W/C  offered at D/C  and Declined W/C at D/C and was escorted to lobby by RN.  

## 2020-04-06 NOTE — ED Notes (Signed)
Pt in bed covered up with blanket and not making any attempt to get dressed. Pt given paper pants and peri pads with mesh under ware.

## 2020-04-06 NOTE — ED Provider Notes (Signed)
[facility] EMERGENCY DEPARTMENT Provider Note   CSN: [id number] Arrival date & time: 04/06/20  3220     History Chief Complaint  Patient presents with  . Vaginal Bleeding    Valerie Howard is a 44 y.o. female.  Patient is a 44 year old female with history of blood loss anemia secondary to heavy menses, schizophrenia, paranoia.  She presents today for evaluation of vaginal bleeding.  Patient reports having several episodes of passing clots over the past 3 days.  Patient denies any pain.  She denies any shortness of breath or lightheadedness.  She has had an ultrasound in the past several weeks showing fibroids.  She is also been admitted and transfused in the recent past.  The history is provided by the patient.  Vaginal Bleeding Quality:  Bright red and clots Severity:  Moderate Onset quality:  Sudden Duration:  3 days Progression:  Waxing and waning Chronicity:  Recurrent Relieved by:  Nothing Worsened by:  Nothing      Past Medical History:  Diagnosis Date  . History of radiation therapy 07/18/17-07/24/17   right ear, operative bed 12 Gy in 3 fractions  . Keloid    right ear  . Paranoid behavior ([place])   . Schizophrenia [facility])     Patient Active Problem List   Diagnosis Date Noted  . Symptomatic anemia 03/18/2020  . Paranoia (psychosis) ([place])   . Acute blood loss anemia 02/27/2019  . 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 Schizoaffective disorder, bipolar type ([place])   . Menorrhagia 05/12/2014  . Personality disorder ([place]) 10/17/2012  . Anemia, iron deficiency 10/13/2012  . Tachycardia 10/01/2012    Past Surgical History:  Procedure Laterality Date  . CESAREAN SECTION    . KENALOG INJECTION Bilateral 07/17/2017   Procedure: KENALOG INJECTION;  Surgeon: Wallace Going, DO;  Location: [place];  Service: Plastics;  Laterality: Bilateral;  . MASS EXCISION N/A 07/17/2017   Procedure: EXCISION KELOID OF RIGHT EAR WITH INTROPERATIVE  KENALOG INJECTION AND POST OP RADIATION;  Surgeon: Wallace Going, DO;  Location: [place];  Service: Plastics;  Laterality: N/A;  . TUBAL LIGATION       OB History   No obstetric history on file.     Family History  Problem Relation Age of Onset  . Hypertension Other   . Diabetes Other   . Cancer Other   . Keloids Mother   . Breast cancer Maternal Grandmother     Social History   Tobacco Use  . Smoking status: Never Smoker  . Smokeless tobacco: Never Used  Vaping Use  . Vaping Use: Never used  Substance Use Topics  . Alcohol use: Yes    Comment: socially  . Drug use: No    Home Medications Prior to Admission medications   Medication Sig Start Date End Date Taking? Authorizing Provider  ferrous sulfate 325 (65 FE) MG tablet Take 1 tablet (325 mg total) by mouth daily with breakfast. 03/19/20 05/18/20  [name], DO  polyethylene glycol (MIRALAX / GLYCOLAX) 17 g packet Take 17 g by mouth daily. 03/19/20   [name], DO  senna-docusate (SENOKOT-S) 8.6-50 MG tablet Take 2 tablets by mouth 2 (two) times daily. 03/19/20   [name], DO    Allergies    Penicillins  Review of Systems   Review of Systems  Genitourinary: Positive for vaginal bleeding.  All other systems reviewed and are negative.   Physical Exam Updated Vital  Signs BP (!) 151/101 (BP Location: Left Arm)   Pulse 95   Temp (!) 97.5 F (36.4 C) (Oral)   Resp 19   Wt 69.5 kg   LMP 03/10/2020   SpO2 100%   BMI 24.00 kg/m   Physical Exam Vitals and nursing note reviewed.  Constitutional:      General: She is not in acute distress.    Appearance: She is well-developed. She is not diaphoretic.  HENT:     Head: Normocephalic and atraumatic.  Cardiovascular:     Rate and Rhythm: Normal rate and regular rhythm.     Heart sounds: No murmur heard.  No friction rub. No gallop.   Pulmonary:     Effort: Pulmonary effort is normal. No respiratory distress.     Breath  sounds: Normal breath sounds. No wheezing.  Abdominal:     General: Bowel sounds are normal. There is no distension.     Palpations: Abdomen is soft.     Tenderness: There is no abdominal tenderness.  Musculoskeletal:        General: Normal range of motion.     Cervical back: Normal range of motion and neck supple.  Skin:    General: Skin is warm and dry.  Neurological:     Mental Status: She is alert and oriented to person, place, and time.     ED Results / Procedures / Treatments   Labs (all labs ordered are listed, but only abnormal results are displayed) Labs Reviewed  CBC - Abnormal; Notable for the following components:      Result Value   Hemoglobin 10.6 (*)    MCH 23.8 (*)    MCHC 27.9 (*)    RDW 26.0 (*)    Platelets 462 (*)    All other components within normal limits  COMPREHENSIVE METABOLIC PANEL  I-STAT BETA HCG BLOOD, ED ([facility], [facility], [facility] ONLY)  TYPE AND SCREEN    EKG None  Radiology No results found.  Procedures Procedures (including critical care time)  Medications Ordered in ED Medications - No data to display  ED Course  I have reviewed the triage vital signs and the nursing notes.  Pertinent labs & imaging results that were available during my care of the patient were reviewed by me and considered in my medical decision making (see chart for details).    MDM Rules/Calculators/A&P  Patient presenting with complaints of vaginal bleeding.  She has history of fibroids and has been transfused in the past for heavy bleeding during menses.  Patient's abdomen is benign.  Her pregnancy test is negative.  There is no hemodynamic instability.  Her blood pressure is normal and heart rate without tachycardia.  Hemoglobin is 10.6.  Upon reviewing her chart, she had an ultrasound several weeks ago showing fibroids.  At this point, I see no indication for further work-up.  Patient to be treated with Provera and her iron supplementation will be restarted.  I see no  indication at this point for transfusion.  Patient will be referred to OB/GYN for outpatient follow-up.  Final Clinical Impression(s) / ED Diagnoses Final diagnoses:  None    Rx / DC Orders ED Discharge Orders    None       Veryl Speak, MD 04/06/20 [phone]

## 2020-04-06 NOTE — Discharge Instructions (Addendum)
Begin taking iron and Provera as prescribed.  Follow-up with GYN in the next few days.  The contact information for the women's health care center has been provided in this discharge summary for you to call and make these arrangements.  Return to the emergency department if you develop dizziness, shortness of breath, high fever, or other new and concerning symptoms.

## 2020-04-06 NOTE — ED Triage Notes (Addendum)
Pt in w/complaints of heavy vaginal bleeding. States day 3 of cycle - mixture of clots, but mostly "pools of blood". Changing pads >8x's/day. Hx of recent admission and blood transfusion on 7/21 for iron deficiency anemia. BLE edema noted as well, denies any sob, cp or dizziness.

## 2020-05-04 ENCOUNTER — Inpatient Hospital Stay (HOSPITAL_COMMUNITY)
Admission: EM | Admit: 2020-05-04 | Discharge: 2020-05-04 | Disposition: A | Discharge status: Home / Self Care | Payer: Medicaid Other | Attending: Obstetrics and Gynecology | Admitting: Obstetrics and Gynecology

## 2020-05-04 ENCOUNTER — Other Ambulatory Visit: Payer: Self-pay | Admitting: Obstetrics and Gynecology

## 2020-05-04 ENCOUNTER — Other Ambulatory Visit: Payer: Self-pay

## 2020-05-04 DIAGNOSIS — F209 Schizophrenia, unspecified: Secondary | ICD-10-CM | POA: Insufficient documentation

## 2020-05-04 DIAGNOSIS — Z880 Allergy status to penicillin: Secondary | ICD-10-CM | POA: Insufficient documentation

## 2020-05-04 DIAGNOSIS — N939 Abnormal uterine and vaginal bleeding, unspecified: Secondary | ICD-10-CM | POA: Diagnosis not present

## 2020-05-04 DIAGNOSIS — Z923 Personal history of irradiation: Secondary | ICD-10-CM | POA: Diagnosis not present

## 2020-05-04 DIAGNOSIS — D509 Iron deficiency anemia, unspecified: Secondary | ICD-10-CM | POA: Insufficient documentation

## 2020-05-04 DIAGNOSIS — Z79899 Other long term (current) drug therapy: Secondary | ICD-10-CM | POA: Diagnosis not present

## 2020-05-04 LAB — CBC
HCT: 35.2 % — ABNORMAL LOW (ref 36.0–46.0)
Hemoglobin: 9.8 g/dL — ABNORMAL LOW (ref 12.0–15.0)
MCH: 23.2 pg — ABNORMAL LOW (ref 26.0–34.0)
MCHC: 27.8 g/dL — ABNORMAL LOW (ref 30.0–36.0)
MCV: 83.4 fL (ref 80.0–100.0)
Platelets: 434 10*3/uL — ABNORMAL HIGH (ref 150–400)
RBC: 4.22 MIL/uL (ref 3.87–5.11)
RDW: 22.5 % — ABNORMAL HIGH (ref 11.5–15.5)
WBC: 7.4 10*3/uL (ref 4.0–10.5)
nRBC: 0 % (ref 0.0–0.2)

## 2020-05-04 LAB — I-STAT BETA HCG BLOOD, ED (MC, WL, AP ONLY): I-stat hCG, quantitative: <5 m[IU]/mL (ref <5)

## 2020-05-04 LAB — COMPREHENSIVE METABOLIC PANEL
ALT: 8 U/L (ref 0–44)
AST: 17 U/L (ref 15–41)
Albumin: 3.8 g/dL (ref 3.5–5.0)
Alkaline Phosphatase: 57 U/L (ref 38–126)
Anion gap: 12 (ref 5–15)
BUN: 12 mg/dL (ref 6–20)
CO2: 21 mmol/L — ABNORMAL LOW (ref 22–32)
Calcium: 9 mg/dL (ref 8.9–10.3)
Chloride: 106 mmol/L (ref 98–111)
Creatinine, Ser: 1.09 mg/dL — ABNORMAL HIGH (ref 0.44–1.00)
GFR calc Af Amer: >60 mL/min (ref >60)
GFR calc non Af Amer: >60 mL/min (ref >60)
Glucose, Bld: 107 mg/dL — ABNORMAL HIGH (ref 70–99)
Potassium: 3.7 mmol/L (ref 3.5–5.1)
Sodium: 139 mmol/L (ref 135–145)
Total Bilirubin: 0.5 mg/dL (ref 0.3–1.2)
Total Protein: 7.7 g/dL (ref 6.5–8.1)

## 2020-05-04 MED ORDER — FERROUS SULFATE 325 (65 FE) MG PO TABS
325.0000 mg | ORAL_TABLET | Freq: Two times a day (BID) | ORAL | 0 refills | Status: DC
Start: 2020-05-04 — End: 2020-05-04

## 2020-05-04 MED ORDER — MEDROXYPROGESTERONE ACETATE 10 MG PO TABS
10.0000 mg | ORAL_TABLET | Freq: Every day | ORAL | 0 refills | Status: DC
Start: 2020-05-04 — End: 2020-05-04

## 2020-05-04 MED ORDER — FERROUS SULFATE 325 (65 FE) MG PO TABS: 325.0000 mg | ORAL_TABLET | Freq: Two times a day (BID) | ORAL | 0 refills | Status: DC

## 2020-05-04 MED ORDER — MEDROXYPROGESTERONE ACETATE 10 MG PO TABS: 10.0000 mg | ORAL_TABLET | Freq: Every day | ORAL | 0 refills | Status: DC

## 2020-05-04 NOTE — ED Triage Notes (Signed)
Pt reports she is on her period and has had 2 days of heavy vaginal bleeding. Having to change the pad every thirty minutes. Endorses dizziness and lightheadedness.

## 2020-05-04 NOTE — MAU Note (Signed)
Patient refusing to answer questions about health history.  Also refusing to undress for examination.  Questions she is answering, she is not answering appropriately.  NP aware and will come to see patient.

## 2020-05-04 NOTE — ED Notes (Signed)
Pt had attitude when I called to update her vitals. The nurse asked he if she could raise her hand pt said something and shook her head no. I could not hear her with my CAPR on. I personal did not ask about it because I did not want to make anything worse. I got her vitals and walked away.

## 2020-05-04 NOTE — MAU Note (Signed)
Pt refusing VS or exam.  Requesting transport back to main ED entrance via wheelchair and to leave.  Rx sent to pharmacy and pt discharged home.  Transported via wheelchair in good condition.

## 2020-05-04 NOTE — MAU Note (Signed)
Pt reports she had several years of heavy periods. Period started 2-3 days ago and has been very heavy.  Changing  Pad every hour.  Not c/o pain or cramping a this time.

## 2020-05-04 NOTE — ED Triage Notes (Signed)
Emergency Medicine Provider OB Triage Evaluation Note  Valerie Howard is a 44 y.o. female, No obstetric history on file., at Unknown gestation who presents to the emergency department with complaints of vaginal bleeding for 2 days. Menses every 2 weeks for the last several months, getting heavier. Saturating a pad every hour. Lightheaded when "straining to pass a blood clot". Not on OCPS.   Review of  Systems  Positive: vaginal bleeding, lightheadedness Negative: syncope, abdominal pain  Physical Exam  BP 110/76 (BP Location: Right Arm)   Pulse 90   Temp 99.1 F (37.3 C) (Oral)   Resp 14   SpO2 100%  General: Awake, no distress  HEENT: Atraumatic  Resp: Normal effort  Cardiac: Normal rate Abd: Nondistended, nontender  MSK: Moves all extremities without difficulty Neuro: Speech clear  Medical Decision Making  Pt evaluated for pregnancy concern and is stable for transfer to MAU. Pt is in agreement with plan for transfer.  3:23 PM Discussed with MAU APP, Anderson Malta, who accepts patient in transfer.  Clinical Impression  No diagnosis found.     Renita Papa, PA-C 05/04/20 1525

## 2020-05-04 NOTE — MAU Provider Note (Signed)
History     CSN: [id number]  Arrival date and time: 05/04/20 1013   First Provider Initiated Contact with Patient 05/04/20 1628      Chief Complaint  Patient presents with  . Vaginal Bleeding   HPI   Valerie Howard is a 43 y.o. female non-pregnant patient with a history of schizophrenia, and abnormal menstrual cycles,  here with vaginal bleeding.  She was transferred here from the ED. Reports heavy bleeding X a few weeks. Patient reports she is not answering any more of my questions. Reports "I am here for my medication to stop my bleeding".   OB History   No obstetric history on file.     Past Medical History:  Diagnosis Date  . History of radiation therapy 07/18/17-07/24/17   right ear, operative bed 12 Gy in 3 fractions  . Keloid    right ear  . Paranoid behavior ([place])   . Schizophrenia [facility])     Past Surgical History:  Procedure Laterality Date  . CESAREAN SECTION    . KENALOG INJECTION Bilateral 07/17/2017   Procedure: KENALOG INJECTION;  Surgeon: Wallace Going, DO;  Location: [place];  Service: Plastics;  Laterality: Bilateral;  . MASS EXCISION N/A 07/17/2017   Procedure: EXCISION KELOID OF RIGHT EAR WITH INTROPERATIVE KENALOG INJECTION AND POST OP RADIATION;  Surgeon: Wallace Going, DO;  Location: [place];  Service: Plastics;  Laterality: N/A;  . TUBAL LIGATION      Family History  Problem Relation Age of Onset  . Hypertension Other   . Diabetes Other   . Cancer Other   . Keloids Mother   . Breast cancer Maternal Grandmother     Social History   Tobacco Use  . Smoking status: Never Smoker  . Smokeless tobacco: Never Used  Vaping Use  . Vaping Use: Never used  Substance Use Topics  . Alcohol use: Yes    Comment: socially  . Drug use: No    Allergies:  Allergies  Allergen Reactions  . Penicillins Hives and Itching    Has patient had a PCN reaction causing immediate rash, facial/tongue/throat  swelling, SOB or lightheadedness with hypotension:Patient refuses to answer (PRA) Has patient had a PCN reaction causing severe rash involving mucus membranes or skin necrosis:PRA Has patient had a PCN reaction that required hospitalization PRA Has patient had a PCN reaction occurring within the last 10 years:PRA If all of the above answers are "NO", then may proceed with Cephalosporin use.     Medications Prior to Admission  Medication Sig Dispense Refill Last Dose  . [DISCONTINUED] medroxyPROGESTERone (PROVERA) 10 MG tablet Take 0.5 tablets (5 mg total) by mouth daily. 10 tablet 0 Past Week at Unknown time  . polyethylene glycol (MIRALAX / GLYCOLAX) 17 g packet Take 17 g by mouth daily. 14 each 0   . senna-docusate (SENOKOT-S) 8.6-50 MG tablet Take 2 tablets by mouth 2 (two) times daily. 20 tablet 0   . [DISCONTINUED] ferrous sulfate 325 (65 FE) MG tablet Take 1 tablet (325 mg total) by mouth 2 (two) times daily with a meal. 30 tablet 0     Results for orders placed or performed during the hospital encounter of 05/04/20 (from the past 48 hour(s))  Comprehensive metabolic panel     Status: Abnormal   Collection Time: 05/04/20 10:28 AM  Result Value Ref Range   Sodium 139 135 - 145 mmol/L   Potassium 3.7 3.5 - 5.1 mmol/L  Chloride 106 98 - 111 mmol/L   CO2 21 (L) 22 - 32 mmol/L   Glucose, Bld 107 (H) 70 - 99 mg/dL    Comment: Glucose reference range applies only to samples taken after fasting for at least 8 hours.   BUN 12 6 - 20 mg/dL   Creatinine, Ser 1.09 (H) 0.44 - 1.00 mg/dL   Calcium 9.0 8.9 - 10.3 mg/dL   Total Protein 7.7 6.5 - 8.1 g/dL   Albumin 3.8 3.5 - 5.0 g/dL   AST 17 15 - 41 U/L   ALT 8 0 - 44 U/L   Alkaline Phosphatase 57 38 - 126 U/L   Total Bilirubin 0.5 0.3 - 1.2 mg/dL   GFR calc non Af Amer >60 >60 mL/min   GFR calc Af Amer >60 >60 mL/min   Anion gap 12 5 - 15    Comment: Performed at [facility], [place] [address] [place], [place] [address]  CBC      Status: Abnormal   Collection Time: 05/04/20 10:28 AM  Result Value Ref Range   WBC 7.4 4.0 - 10.5 K/uL   RBC 4.22 3.87 - 5.11 MIL/uL   Hemoglobin 9.8 (L) 12.0 - 15.0 g/dL   HCT 35.2 (L) 36 - 46 %   MCV 83.4 80.0 - 100.0 fL   MCH 23.2 (L) 26.0 - 34.0 pg   MCHC 27.8 (L) 30.0 - 36.0 g/dL   RDW 22.5 (H) 11.5 - 15.5 %   Platelets 434 (H) 150 - 400 K/uL   nRBC 0.0 0.0 - 0.2 %    Comment: Performed at [facility], [place] [address] [place], [place] [address]  Type and screen [place]     Status: None (Preliminary result)   Collection Time: 05/04/20 10:28 AM  Result Value Ref Range   ABO/RH(D) O POS    Antibody Screen NEG    Sample Expiration      05/07/2020,2359 Performed at [facility], [place] [address], [place], [place] [address]    Unit Number [id number]    Blood Component Type RED CELLS,LR    Unit division 00    Status of Unit ALLOCATED    Donor AG Type NEGATIVE FOR E ANTIGEN NEGATIVE FOR KIDD A ANTIGEN    Transfusion Status OK TO TRANSFUSE    Crossmatch Result COMPATIBLE    Unit Number [id number]    Blood Component Type RED CELLS,LR    Unit division 00    Status of Unit ALLOCATED    Donor AG Type NEGATIVE FOR E ANTIGEN NEGATIVE FOR KIDD A ANTIGEN    Transfusion Status OK TO TRANSFUSE    Crossmatch Result COMPATIBLE   I-Stat beta hCG blood, ED     Status: None   Collection Time: 05/04/20 10:44 AM  Result Value Ref Range   I-stat hCG, quantitative <5.0 <5 mIU/mL   Comment 3            Comment:   GEST. AGE      CONC.  (mIU/mL)   <=1 WEEK        5 - 50     2 WEEKS       50 - 500     3 WEEKS       100 - 10,000     4 WEEKS     1,000 - 30,000        FEMALE AND NON-PREGNANT FEMALE:     LESS THAN 5  mIU/mL     Review of Systems  Gastrointestinal: Negative for abdominal pain.  Genitourinary: Positive for vaginal bleeding.  Neurological: Negative for dizziness and headaches.   Physical Exam   Blood pressure 110/76, pulse 90, temperature  99.1 F (37.3 C), temperature source Oral, resp. rate 14, SpO2 100 %.  Physical Exam Constitutional:      General: She is not in acute distress.    Appearance: She is not ill-appearing.  HENT:     Head: Normocephalic.  Pulmonary:     Effort: Pulmonary effort is normal.  Psychiatric:        Attention and Perception: She is inattentive.        Mood and Affect: Affect is blunt, angry and inappropriate.        Behavior: Behavior is agitated.        Judgment: Judgment is impulsive and inappropriate.     MAU Course  Procedures  MDM  Patient refused all exams.  hgb stable   Assessment and Plan   A:  1. Vaginal bleeding, abnormal   2. Iron deficiency anemia, unspecified iron deficiency anemia type      P:  Discharge home Rx: Refill provera F/u with GYN, stressed importance of this    Lezlie Lye, NP 05/04/2020 4:42 PM

## 2020-05-04 NOTE — MAU Note (Signed)
Pt very hostile to questions. 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 Stated she thought that was not a usual thing to do and sat on bed with clothing still on. Informed provider.

## 2020-05-06 LAB — TYPE AND SCREEN
ABO/RH(D): O POS
Antibody Screen: NEGATIVE
Donor AG Type: NEGATIVE
Donor AG Type: NEGATIVE
Unit division: 0
Unit division: 0

## 2020-05-06 LAB — BPAM RBC
Blood Product Expiration Date: 202109242359
Blood Product Expiration Date: 202110012359
Unit Type and Rh: 5100
Unit Type and Rh: 9500

## 2020-05-23 ENCOUNTER — Emergency Department (HOSPITAL_COMMUNITY): Payer: Medicaid Other

## 2020-05-23 ENCOUNTER — Encounter (HOSPITAL_COMMUNITY): Payer: Self-pay

## 2020-05-23 ENCOUNTER — Emergency Department (HOSPITAL_COMMUNITY)
Admission: EM | Admit: 2020-05-23 | Discharge: 2020-05-24 | Disposition: A | Discharge status: Home / Self Care | Payer: Medicaid Other | Attending: Emergency Medicine | Admitting: Emergency Medicine

## 2020-05-23 ENCOUNTER — Other Ambulatory Visit: Payer: Self-pay

## 2020-05-23 DIAGNOSIS — D649 Anemia, unspecified: Secondary | ICD-10-CM | POA: Diagnosis not present

## 2020-05-23 DIAGNOSIS — N939 Abnormal uterine and vaginal bleeding, unspecified: Secondary | ICD-10-CM | POA: Insufficient documentation

## 2020-05-23 DIAGNOSIS — M6281 Muscle weakness (generalized): Secondary | ICD-10-CM | POA: Insufficient documentation

## 2020-05-23 DIAGNOSIS — R000 Tachycardia, unspecified: Secondary | ICD-10-CM | POA: Diagnosis not present

## 2020-05-23 DIAGNOSIS — R5382 Chronic fatigue, unspecified: Secondary | ICD-10-CM | POA: Insufficient documentation

## 2020-05-23 DIAGNOSIS — R2243 Localized swelling, mass and lump, lower limb, bilateral: Secondary | ICD-10-CM | POA: Insufficient documentation

## 2020-05-23 DIAGNOSIS — Z20822 Contact with and (suspected) exposure to covid-19: Secondary | ICD-10-CM | POA: Diagnosis not present

## 2020-05-23 DIAGNOSIS — R0602 Shortness of breath: Secondary | ICD-10-CM | POA: Diagnosis present

## 2020-05-23 LAB — CBC WITH DIFFERENTIAL/PLATELET
Abs Immature Granulocytes: 0.01 10*3/uL (ref 0.00–0.07)
Basophils Absolute: 0 10*3/uL (ref 0.0–0.1)
Basophils Relative: 1 %
Eosinophils Absolute: 0.1 10*3/uL (ref 0.0–0.5)
Eosinophils Relative: 1 %
HCT: 25.6 % — ABNORMAL LOW (ref 36.0–46.0)
Hemoglobin: 7 g/dL — ABNORMAL LOW (ref 12.0–15.0)
Immature Granulocytes: 0 %
Lymphocytes Relative: 38 %
Lymphs Abs: 2.3 10*3/uL (ref 0.7–4.0)
MCH: 21.5 pg — ABNORMAL LOW (ref 26.0–34.0)
MCHC: 27.3 g/dL — ABNORMAL LOW (ref 30.0–36.0)
MCV: 78.8 fL — ABNORMAL LOW (ref 80.0–100.0)
Monocytes Absolute: 0.4 10*3/uL (ref 0.1–1.0)
Monocytes Relative: 7 %
Neutro Abs: 3.1 10*3/uL (ref 1.7–7.7)
Neutrophils Relative %: 53 %
Platelets: 326 10*3/uL (ref 150–400)
RBC: 3.25 MIL/uL — ABNORMAL LOW (ref 3.87–5.11)
RDW: 22 % — ABNORMAL HIGH (ref 11.5–15.5)
WBC: 6 10*3/uL (ref 4.0–10.5)
nRBC: 0 % (ref 0.0–0.2)

## 2020-05-23 LAB — PREPARE RBC (CROSSMATCH)

## 2020-05-23 LAB — BASIC METABOLIC PANEL
Anion gap: 7 (ref 5–15)
BUN: 11 mg/dL (ref 6–20)
CO2: 23 mmol/L (ref 22–32)
Calcium: 8.5 mg/dL — ABNORMAL LOW (ref 8.9–10.3)
Chloride: 108 mmol/L (ref 98–111)
Creatinine, Ser: 0.85 mg/dL (ref 0.44–1.00)
GFR calc Af Amer: >60 mL/min (ref >60)
GFR calc non Af Amer: >60 mL/min (ref >60)
Glucose, Bld: 99 mg/dL (ref 70–99)
Potassium: 3.9 mmol/L (ref 3.5–5.1)
Sodium: 138 mmol/L (ref 135–145)

## 2020-05-23 LAB — I-STAT BETA HCG BLOOD, ED (MC, WL, AP ONLY): I-stat hCG, quantitative: <5 m[IU]/mL (ref <5)

## 2020-05-23 LAB — TROPONIN I (HIGH SENSITIVITY)
Troponin I (High Sensitivity): 3 ng/L (ref <18)
Troponin I (High Sensitivity): 3 ng/L (ref <18)

## 2020-05-23 LAB — RESPIRATORY PANEL BY RT PCR (FLU A&B, COVID)
Influenza A by PCR: NEGATIVE
Influenza B by PCR: NEGATIVE
SARS Coronavirus 2 by RT PCR: NEGATIVE

## 2020-05-23 MED ORDER — FERROUS SULFATE 325 (65 FE) MG PO TABS: 325.0000 mg | ORAL_TABLET | Freq: Every day | ORAL | 0 refills | Status: DC

## 2020-05-23 MED ORDER — SODIUM CHLORIDE 0.9% IV SOLUTION: Freq: Once | INTRAVENOUS | Status: DC

## 2020-05-23 MED ORDER — MEGESTROL ACETATE 20 MG PO TABS
20.0000 mg | ORAL_TABLET | Freq: Two times a day (BID) | ORAL | Status: DC
  Administered 2020-05-23: 20 mg via ORAL
  Filled 2020-05-23: qty 1

## 2020-05-23 MED ORDER — MEGESTROL ACETATE 20 MG PO TABS: 20.0000 mg | ORAL_TABLET | Freq: Two times a day (BID) | ORAL | 0 refills | Status: AC

## 2020-05-23 NOTE — ED Provider Notes (Signed)
[facility] EMERGENCY DEPARTMENT Provider Note   CSN: [id number] Arrival date & time: 05/23/20  1515     History Chief Complaint  Patient presents with  . Palpitations    Valerie Howard is a 44 y.o. female.  HPI     Pt is a 44 y/o female with a h/o radiation therapy, keloid, paranoid behavior, schizophrenia, anemia, who presents to the ED today for eval of SOB for the last few days. States she is not SOB at rest but becomes SOB and lightheadedness with ambulation.  She also feels like her heart is racing when she ambulates.  She denies any chest pain, cough, pleuritic pain, or fevers.   She does report she had some bilat feet swelling that started today.  Denies calf pain.  She is currently on her menstrual cycle. States they are usually very heavy. Per chart review she has a h/o fibroids. Denies bloody stools or melena.   Past Medical History:  Diagnosis Date  . History of radiation therapy 07/18/17-07/24/17   right ear, operative bed 12 Gy in 3 fractions  . Keloid    right ear  . Paranoid behavior ([place])   . Schizophrenia [facility])     Patient Active Problem List   Diagnosis Date Noted  . Symptomatic anemia 03/18/2020  . Paranoia (psychosis) ([place])   . Acute blood loss anemia 02/27/2019  . 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 Schizoaffective disorder, bipolar type ([place])   . Menorrhagia 05/12/2014  . Personality disorder ([place]) 10/17/2012  . Anemia, iron deficiency 10/13/2012  . Tachycardia 10/01/2012    Past Surgical History:  Procedure Laterality Date  . CESAREAN SECTION    . KENALOG INJECTION Bilateral 07/17/2017   Procedure: KENALOG INJECTION;  Surgeon: Wallace Going, DO;  Location: [facility];  Service: Plastics;  Laterality: Bilateral;  . MASS EXCISION N/A 07/17/2017   Procedure: EXCISION KELOID OF RIGHT EAR WITH INTROPERATIVE KENALOG INJECTION AND POST OP RADIATION;  Surgeon: Wallace Going, DO;  Location: [facility];  Service: Plastics;  Laterality: N/A;  . TUBAL LIGATION       OB History   No obstetric history on file.     Family History  Problem Relation Age of Onset  . Hypertension Other   . Diabetes Other   . Cancer Other   . Keloids Mother   . Breast cancer Maternal Grandmother     Social History   Tobacco Use  . Smoking status: Never Smoker  . Smokeless tobacco: Never Used  Vaping Use  . Vaping Use: Never used  Substance Use Topics  . Alcohol use: Yes    Comment: socially  . Drug use: No    Home Medications Prior to Admission medications   Medication Sig Start Date End Date Taking? Authorizing Provider  ferrous sulfate 325 (65 FE) MG tablet Take 1 tablet (325 mg total) by mouth daily. 05/23/20   Oliverio Cho S, PA-C  megestrol (MEGACE) 20 MG tablet Take 1 tablet (20 mg total) by mouth 2 (two) times daily. 05/23/20 06/22/20  Tenasia Aull S, PA-C  polyethylene glycol (MIRALAX / GLYCOLAX) 17 g packet Take 17 g by mouth daily. Patient not taking: Reported on 05/23/2020 03/19/20   [name], DO  senna-docusate (SENOKOT-S) 8.6-50 MG tablet Take 2 tablets by mouth 2 (two) times daily. Patient not taking: Reported on 05/23/2020 03/19/20   [name], DO    Allergies    Penicillins  Review of  Systems   Review of Systems  Constitutional: Negative for chills and fever.  HENT: Negative for ear pain and sore throat.   Eyes: Negative for pain and visual disturbance.  Respiratory: Positive for shortness of breath. Negative for cough.   Cardiovascular: Negative for chest pain.  Gastrointestinal: Negative for abdominal pain, constipation, diarrhea, nausea and vomiting.  Genitourinary: Positive for vaginal bleeding. Negative for dysuria and hematuria.  Musculoskeletal: Negative for back pain.  Skin: Negative for rash.  Neurological: Positive for light-headedness. Negative for seizures and syncope.  All other systems reviewed and are negative.     Physical  Exam Updated Vital Signs BP 109/71   Pulse 90   Temp 98.9 F (37.2 C) (Oral)   Resp 18   Ht 5\' 7"  (1.702 m)   Wt 77.1 kg   LMP 05/21/2020   SpO2 100%   BMI 26.63 kg/m   Physical Exam  Physical Exam Vitals and nursing note reviewed.  Constitutional:      General: She is not in acute distress.    Appearance: She is well-developed.  HENT:     Head: Normocephalic and atraumatic.  Eyes:     Conjunctiva/sclera: Conjunctivae normal.     Comments: Pale conjunctiva  Cardiovascular:     Rate and Rhythm: Regular rhythm. Tachycardia present.     Heart sounds: No murmur heard.   Pulmonary:     Effort: Pulmonary effort is normal. No respiratory distress.     Breath sounds: Normal breath sounds. No wheezing, rhonchi or rales.  Abdominal:     General: Bowel sounds are normal.     Palpations: Abdomen is soft.     Tenderness: There is no abdominal tenderness. There is no guarding or rebound.  Musculoskeletal:     Cervical back: Neck supple.     Comments: Swelling to bilat feet, no leg edema or calf ttp  Skin:    General: Skin is warm and dry.  Neurological:     Mental Status: She is alert.   ED Results / Procedures / Treatments   Labs (all labs ordered are listed, but only abnormal results are displayed) Labs Reviewed  CBC WITH DIFFERENTIAL/PLATELET - Abnormal; Notable for the following components:      Result Value   RBC 3.25 (*)    Hemoglobin 7.0 (*)    HCT 25.6 (*)    MCV 78.8 (*)    MCH 21.5 (*)    MCHC 27.3 (*)    RDW 22.0 (*)    All other components within normal limits  BASIC METABOLIC PANEL - Abnormal; Notable for the following components:   Calcium 8.5 (*)    All other components within normal limits  RESPIRATORY PANEL BY RT PCR (FLU A&B, COVID)  I-STAT BETA HCG BLOOD, ED (MC, WL, AP ONLY)  POC OCCULT BLOOD, ED  TYPE AND SCREEN  PREPARE RBC (CROSSMATCH)  TROPONIN I (HIGH SENSITIVITY)  TROPONIN I (HIGH SENSITIVITY)    EKG EKG  Interpretation  Date/Time:  Saturday May 23 2020 18:05:13 EDT Ventricular Rate:  103 PR Interval:    QRS Duration: 86 QT Interval:  369 QTC Calculation: 483 R Axis:   49 Text Interpretation: Sinus tachycardia Since last tracing Rate faster Otherwise no significant change Confirmed by Calvert Cantor [phone]) on 05/23/2020 6:09:20 PM   Radiology DG Chest Portable 1 View  Result Date: 05/23/2020 CLINICAL DATA:  Shortness of breath. EXAM: PORTABLE CHEST 1 VIEW COMPARISON:  03/18/2020 FINDINGS: The cardiomediastinal contours are normal. The lungs are  clear. Pulmonary vasculature is normal. No consolidation, pleural effusion, or pneumothorax. No acute osseous abnormalities are seen. IMPRESSION: Negative AP view of the chest. Electronically Signed   By: Keith Rake M.D.   On: 05/23/2020 18:24    Procedures Procedures (including critical care time)  CRITICAL CARE Performed by: Rodney Booze   Total critical care time: 33 minutes  Critical care time was exclusive of separately billable procedures and treating other patients.  Critical care was necessary to treat or prevent imminent or life-threatening deterioration.  Critical care was time spent personally by me on the following activities: development of treatment plan with patient and/or surrogate as well as nursing, discussions with consultants, evaluation of patient's response to treatment, examination of patient, obtaining history from patient or surrogate, ordering and performing treatments and interventions, ordering and review of laboratory studies, ordering and review of radiographic studies, pulse oximetry and re-evaluation of patient's condition.   Medications Ordered in ED Medications  0.9 %  sodium chloride infusion (Manually program via Guardrails IV Fluids) (has no administration in time range)  megestrol (MEGACE) tablet 20 mg (20 mg Oral Given 05/23/20 2143)    ED Course  I have reviewed the triage vital  signs and the nursing notes.  Pertinent labs & imaging results that were available during my care of the patient were reviewed by me and considered in my medical decision making (see chart for details).    MDM Rules/Calculators/A&P                         44 year old female presenting to the emergency department today for evaluation of shortness of breath and lightheadedness.  Reviewed/interpreted labs CBC is without leukocytosis this does show a hemoglobin of 7 which is dropped almost 3 points from labs 2 weeks ago.             - 2 units PRBCs ordered             - Attempted to obtain Hemoccult however patient refused exam. BMP nonacute Beta-hCG negative Troponin neg COVID neg  EKG showed sinus tachycardia with no acute ischemic changes  Chest x-ray reviewed/interpreted - and did not show acute cardiopulmonary abnormalities.  Suspect her sxs today are related to symptomatic anemia. PRBCs have been ordered. Will plan for admission.   7:22 PM CONSULT with Dr. Jimmye Norman with OB-GYN who recommends 2 units PRBC and starting megace 20 mg BID. Also recommends pelvic exam. States that pt may be a candidate for discharge after blood transfusion if she initiates megace and f/u closely with OB-GYN. He will come see the patient in the ED. He also recommended possible psych consult given pts paranoid behaviors however she does not appear to be acutely decompensated on my evaluation to require an emergent consult however she will need to f/u as an outpatient.   7:30 PM I reassessed the patient and discussed plan. Pt declines pelvic exam and declines [place].    OB-GYN, Dr. Jimmye Norman evaluated pt at bedside. During that time she was refusing care and admission. I discussed with pt later and she states that she would consider admission if necessary. We will give 2 units in the ED and reassess. If she is feeling improved following PRBCs, then we will d/c her with megace, iron and information to f/u  closely with OB-GYN. If she is still symptomatic, then will plan to reconsult OB-GYN for admission.  At shift change, care transitioned to [facility], PA-C with  plan to reassess pt after blood transfusion   Final Clinical Impression(s) / ED Diagnoses Final diagnoses:  Symptomatic anemia  Vaginal bleeding    Rx / DC Orders ED Discharge Orders         Ordered    ferrous sulfate 325 (65 FE) MG tablet  Daily        05/23/20 2327    megestrol (MEGACE) 20 MG tablet  2 times daily        05/23/20 2327           Rodney Booze, PA-C 05/23/20 2347    Truddie Hidden, MD 05/24/20 1240

## 2020-05-23 NOTE — Discharge Instructions (Addendum)
Take megace and iron as directed.  It is very important that you follow up with the OB-GYN provided on your discharge paperwork within the next 5-7 days.  Please return to the emergency department for any new or worsening symptoms.

## 2020-05-23 NOTE — ED Notes (Signed)
Pt refused to sign blood consent. Pt stated "I don't have to sign anything but you can give me the blood".

## 2020-05-23 NOTE — Consult Note (Signed)
Reason for Consult: Symptomatic fibroids  Referring Physician: Coral Ceo, PA  Valerie Howard is an 44 y.o. female. G2P2 who presents for increased vaginal bleeding due to fibroids. This has happened as least once a month for the past 5-6 months. Last U/S 6/5 noted 6x7cm, 4x4.6 and 3x3.5 cm fibriod. She has tried provera as outpatient and returns to ED 1-2x per month to receive the same prescriptions. Discussed need for follow up with Outpatient Ob/Gyn and discuss possible hysterectomy.  Pt became irate stating"I don't trust you, you're not taking out my uterus. I know who is, but I'm not telling you."  Pertinent Gynecological History: Menses: flow is moderate and regular every 8 days without intermenstrual spotting Bleeding: dysfunctional uterine bleeding and symptomatic fibroids Contraception: tubal ligation DES exposure: unknown Blood transfusions: 02/2020 for symptamatic fibroids Sexually transmitted diseases: no past history OB History: G2, P2002   Menstrual History: Patient's last menstrual period was 05/21/2020.    Past Medical History:  Diagnosis Date  . History of radiation therapy 07/18/17-07/24/17   right ear, operative bed 12 Gy in 3 fractions  . Keloid    right ear  . Paranoid behavior ([place])   . Schizophrenia [facility])     Past Surgical History:  Procedure Laterality Date  . CESAREAN SECTION    . KENALOG INJECTION Bilateral 07/17/2017   Procedure: KENALOG INJECTION;  Surgeon: Wallace Going, DO;  Location: [facility];  Service: Plastics;  Laterality: Bilateral;  . MASS EXCISION N/A 07/17/2017   Procedure: EXCISION KELOID OF RIGHT EAR WITH INTROPERATIVE KENALOG INJECTION AND POST OP RADIATION;  Surgeon: Wallace Going, DO;  Location: [facility];  Service: Plastics;  Laterality: N/A;  . TUBAL LIGATION      Family History  Problem Relation Age of Onset  . Hypertension Other   . Diabetes Other   . Cancer Other   . Keloids  Mother   . Breast cancer Maternal Grandmother     Social History:  reports that she has never smoked. She has never used smokeless tobacco. She reports current alcohol use. She reports that she does not use drugs.  Allergies:  Allergies  Allergen Reactions  . Penicillins Hives and Itching    Has patient had a PCN reaction causing immediate rash, facial/tongue/throat swelling, SOB or lightheadedness with hypotension:Patient refuses to answer (PRA) Has patient had a PCN reaction causing severe rash involving mucus membranes or skin necrosis:PRA Has patient had a PCN reaction that required hospitalization PRA Has patient had a PCN reaction occurring within the last 10 years:PRA If all of the above answers are "NO", then may proceed with Cephalosporin use.     Medications: I have reviewed the patient's current medications.  Review of Systems  Constitutional: Negative for activity change, appetite change, fatigue and unexpected weight change.  HENT: Negative.   Eyes: Negative.   Respiratory: Negative.  Negative for chest tightness, shortness of breath and wheezing.   Cardiovascular: Negative.  Negative for chest pain and leg swelling.  Gastrointestinal: Negative.  Negative for abdominal distention, abdominal pain, constipation, diarrhea, nausea and vomiting.  Endocrine: Negative.   Genitourinary: Negative.  Negative for dysuria and hematuria.  Neurological: Negative.  Negative for dizziness, weakness, light-headedness, numbness and headaches.  Hematological: Negative.   Psychiatric/Behavioral: Positive for agitation and behavioral problems. Negative for confusion and decreased concentration. The patient is nervous/anxious.        Paranoid, and uncooperative with MD and nurses    Blood pressure 130/80, pulse  90, temperature 98.9 F (37.2 C), temperature source Oral, resp. rate 14, height 5\' 7"  (1.702 m), weight 77.1 kg, last menstrual period 05/21/2020, SpO2 99 %. Physical Exam Vitals  reviewed.  Constitutional:      Appearance: She is well-developed.  HENT:     Head: Normocephalic and atraumatic.  Eyes:     Pupils: Pupils are equal, round, and reactive to light.  Cardiovascular:     Rate and Rhythm: Normal rate.  Pulmonary:     Effort: Pulmonary effort is normal.  Abdominal:     General: There is no distension.     Palpations: Abdomen is soft.     Tenderness: There is no abdominal tenderness. There is no guarding.  Genitourinary:    Comments: Refused pelvic exam  Musculoskeletal:     Cervical back: Normal range of motion.  Skin:    General: Skin is warm and dry.  Neurological:     Mental Status: She is alert and oriented to person, place, and time.  Psychiatric:        Behavior: Behavior normal.     Results for orders placed or performed during the hospital encounter of 05/23/20 (from the past 48 hour(s))  CBC with Differential     Status: Abnormal   Collection Time: 05/23/20  6:12 PM  Result Value Ref Range   WBC 6.0 4.0 - 10.5 K/uL   RBC 3.25 (L) 3.87 - 5.11 MIL/uL   Hemoglobin 7.0 (L) 12.0 - 15.0 g/dL   HCT 25.6 (L) 36 - 46 %   MCV 78.8 (L) 80.0 - 100.0 fL   MCH 21.5 (L) 26.0 - 34.0 pg   MCHC 27.3 (L) 30.0 - 36.0 g/dL   RDW 22.0 (H) 11.5 - 15.5 %   Platelets 326 150 - 400 K/uL   nRBC 0.0 0.0 - 0.2 %   Neutrophils Relative % 53 %   Neutro Abs 3.1 1.7 - 7.7 K/uL   Lymphocytes Relative 38 %   Lymphs Abs 2.3 0.7 - 4.0 K/uL   Monocytes Relative 7 %   Monocytes Absolute 0.4 0 - 1 K/uL   Eosinophils Relative 1 %   Eosinophils Absolute 0.1 0 - 0 K/uL   Basophils Relative 1 %   Basophils Absolute 0.0 0 - 0 K/uL   Immature Granulocytes 0 %   Abs Immature Granulocytes 0.01 0.00 - 0.07 K/uL    Comment: Performed at [facility], [address] [place], [place]  [address]  Basic metabolic panel     Status: Abnormal   Collection Time: 05/23/20  6:12 PM  Result Value Ref Range   Sodium 138 135 - 145 mmol/L   Potassium 3.9 3.5 - 5.1 mmol/L    Chloride 108 98 - 111 mmol/L   CO2 23 22 - 32 mmol/L   Glucose, Bld 99 70 - 99 mg/dL    Comment: Glucose reference range applies only to samples taken after fasting for at least 8 hours.   BUN 11 6 - 20 mg/dL   Creatinine, Ser 0.85 0.44 - 1.00 mg/dL   Calcium 8.5 (L) 8.9 - 10.3 mg/dL   GFR calc non Af Amer >60 >60 mL/min   GFR calc Af Amer >60 >60 mL/min   Anion gap 7 5 - 15    Comment: Performed at [facility] [address] [place], [place] [address]  Troponin I (High Sensitivity)     Status: None   Collection Time: 05/23/20  6:12 PM  Result  Value Ref Range   Troponin I (High Sensitivity) 3 <18 ng/L    Comment: (NOTE) Elevated high sensitivity troponin I (hsTnI) values and significant  changes across serial measurements may suggest ACS but many other  chronic and acute conditions are known to elevate hsTnI results.  Refer to the "Links" section for chest pain algorithms and additional  guidance. Performed at [facility], [place] [address], [place], [place] [address]   Type and screen [place]     Status: None (Preliminary result)   Collection Time: 05/23/20  6:12 PM  Result Value Ref Range   ABO/RH(D) O POS    Antibody Screen NEG    Sample Expiration 05/26/2020,2359    Unit Number [id number]    Blood Component Type RBC LR PHER2    Unit division 00    Status of Unit ISSUED    Donor AG Type NEGATIVE FOR KIDD A ANTIGEN NEGATIVE FOR E ANTIGEN    Transfusion Status OK TO TRANSFUSE    Crossmatch Result COMPATIBLE    Unit Number [id number]    Blood Component Type RED CELLS,LR    Unit division 00    Status of Unit ALLOCATED    Donor AG Type NEGATIVE FOR KIDD A ANTIGEN NEGATIVE FOR E ANTIGEN    Transfusion Status OK TO TRANSFUSE    Crossmatch Result COMPATIBLE    Unit Number [id number]    Blood Component Type RED CELLS,LR    Unit division 00    Status of Unit ALLOCATED    Transfusion Status OK TO TRANSFUSE    Crossmatch Result  COMPATIBLE   I-Stat Beta hCG blood, ED (MC, WL, AP only)     Status: None   Collection Time: 05/23/20  6:18 PM  Result Value Ref Range   I-stat hCG, quantitative <5.0 <5 mIU/mL   Comment 3            Comment:   GEST. AGE      CONC.  (mIU/mL)   <=1 WEEK        5 - 50     2 WEEKS       50 - 500     3 WEEKS       100 - 10,000     4 WEEKS     1,000 - 30,000        FEMALE AND NON-PREGNANT FEMALE:     LESS THAN 5 mIU/mL   Prepare RBC (crossmatch)     Status: None   Collection Time: 05/23/20  7:02 PM  Result Value Ref Range   Order Confirmation      ORDER PROCESSED BY BLOOD BANK Performed at [facility], [place] [address] [place], [place] [address]   Respiratory Panel by RT PCR (Flu A&B, Covid) - Nasopharyngeal Swab     Status: None   Collection Time: 05/23/20  7:34 PM   Specimen: Nasopharyngeal Swab  Result Value Ref Range   SARS Coronavirus 2 by RT PCR NEGATIVE NEGATIVE    Comment: (NOTE) SARS-CoV-2 target nucleic acids are NOT DETECTED.  The SARS-CoV-2 RNA is generally detectable in upper respiratoy specimens during the acute phase of infection. The lowest concentration of SARS-CoV-2 viral copies this assay can detect is 131 copies/mL. A negative result does not preclude SARS-Cov-2 infection and should not be used as the sole basis for treatment or other patient management decisions. A negative result may occur with  improper specimen collection/handling, submission of specimen other than nasopharyngeal  swab, presence of viral mutation(s) within the areas targeted by this assay, and inadequate number of viral copies (<131 copies/mL). A negative result must be combined with clinical observations, patient history, and epidemiological information. The expected result is Negative.  Fact Sheet for Patients:  PinkCheek.be  Fact Sheet for Healthcare Providers:  GravelBags.it  This test is no t yet approved or cleared by  the [place] FDA and  has been authorized for detection and/or diagnosis of SARS-CoV-2 by FDA under an Emergency Use Authorization (EUA). This EUA will remain  in effect (meaning this test can be used) for the duration of the COVID-19 declaration under Section 564(b)(1) of the Act, 21 U.S.C. section 360bbb-3(b)(1), unless the authorization is terminated or revoked sooner.     Influenza A by PCR NEGATIVE NEGATIVE   Influenza B by PCR NEGATIVE NEGATIVE    Comment: (NOTE) The Xpert Xpress SARS-CoV-2/FLU/RSV assay is intended as an aid in  the diagnosis of influenza from Nasopharyngeal swab specimens and  should not be used as a sole basis for treatment. Nasal washings and  aspirates are unacceptable for Xpert Xpress SARS-CoV-2/FLU/RSV  testing.  Fact Sheet for Patients: PinkCheek.be  Fact Sheet for Healthcare Providers: GravelBags.it  This test is not yet approved or cleared by the [place] FDA and  has been authorized for detection and/or diagnosis of SARS-CoV-2 by  FDA under an Emergency Use Authorization (EUA). This EUA will remain  in effect (meaning this test can be used) for the duration of the  Covid-19 declaration under Section 564(b)(1) of the Act, 21  U.S.C. section 360bbb-3(b)(1), unless the authorization is  terminated or revoked. Performed at [facility], [place] [address], [place], [place] [address]   Troponin I (High Sensitivity)     Status: None   Collection Time: 05/23/20  9:00 PM  Result Value Ref Range   Troponin I (High Sensitivity) 3 <18 ng/L    Comment: (NOTE) Elevated high sensitivity troponin I (hsTnI) values and significant  changes across serial measurements may suggest ACS but many other  chronic and acute conditions are known to elevate hsTnI results.  Refer to the "Links" section for chest pain algorithms and additional  guidance. Performed at [facility], [place]  [address], [place], [place] [address]     DG Chest Portable 1 View  Result Date: 05/23/2020 CLINICAL DATA:  Shortness of breath. EXAM: PORTABLE CHEST 1 VIEW COMPARISON:  03/18/2020 FINDINGS: The cardiomediastinal contours are normal. The lungs are clear. Pulmonary vasculature is normal. No consolidation, pleural effusion, or pneumothorax. No acute osseous abnormalities are seen. IMPRESSION: Negative AP view of the chest. Electronically Signed   By: Keith Rake M.D.   On: 05/23/2020 18:24    Assessment/Plan: Symptomatic fibroids: Pt w/ multifibroid uterus, discussed repeating U/S to assess size position of fibroids/pt declined.  Discussed starting [place] agonist, pt declined, discussed giving blood transfusion, pt desires transfusion but does not want to sign consents.  Paranoid Schizophrenic: Psych consult urgently needed, on chart review, pt showed concerning signs at her ED visit earlier this month. Today she has inconsistent thoughts that we are trying to harm her, she does not trust any recommendations given today. She Psych illness is inhibiting her from receiving care.   Dispo: plan to restart Megace and transfuse. She needs to follow up with Ob/Gyn outpatient to arrange further management.    Cherre Blanc 05/23/2020
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
## 2020-05-24 ENCOUNTER — Emergency Department (HOSPITAL_COMMUNITY)
Admission: EM | Admit: 2020-05-24 | Discharge: 2020-05-25 | Disposition: A | Discharge status: Home / Self Care | Payer: Medicaid Other | Source: Home / Self Care | Attending: Emergency Medicine | Admitting: Emergency Medicine

## 2020-05-24 ENCOUNTER — Encounter (HOSPITAL_COMMUNITY): Payer: Self-pay

## 2020-05-24 DIAGNOSIS — N939 Abnormal uterine and vaginal bleeding, unspecified: Secondary | ICD-10-CM | POA: Insufficient documentation

## 2020-05-24 DIAGNOSIS — R5382 Chronic fatigue, unspecified: Secondary | ICD-10-CM | POA: Insufficient documentation

## 2020-05-24 DIAGNOSIS — R531 Weakness: Secondary | ICD-10-CM

## 2020-05-24 DIAGNOSIS — M6281 Muscle weakness (generalized): Secondary | ICD-10-CM | POA: Insufficient documentation

## 2020-05-24 DIAGNOSIS — Z20822 Contact with and (suspected) exposure to covid-19: Secondary | ICD-10-CM | POA: Insufficient documentation

## 2020-05-24 LAB — BASIC METABOLIC PANEL WITH GFR
Anion gap: 8 (ref 5–15)
BUN: 13 mg/dL (ref 6–20)
CO2: 23 mmol/L (ref 22–32)
Calcium: 9 mg/dL (ref 8.9–10.3)
Chloride: 106 mmol/L (ref 98–111)
Creatinine, Ser: 0.79 mg/dL (ref 0.44–1.00)
GFR calc Af Amer: >60 mL/min
GFR calc non Af Amer: >60 mL/min
Glucose, Bld: 92 mg/dL (ref 70–99)
Potassium: 4 mmol/L (ref 3.5–5.1)
Sodium: 137 mmol/L (ref 135–145)

## 2020-05-24 LAB — CBC
HCT: 32.5 % — ABNORMAL LOW (ref 36.0–46.0)
Hemoglobin: 9.4 g/dL — ABNORMAL LOW (ref 12.0–15.0)
MCH: 23.2 pg — ABNORMAL LOW (ref 26.0–34.0)
MCHC: 28.9 g/dL — ABNORMAL LOW (ref 30.0–36.0)
MCV: 80 fL (ref 80.0–100.0)
Platelets: 323 10*3/uL (ref 150–400)
RBC: 4.06 MIL/uL (ref 3.87–5.11)
RDW: 20.4 % — ABNORMAL HIGH (ref 11.5–15.5)
WBC: 8.2 10*3/uL (ref 4.0–10.5)
nRBC: 0 % (ref 0.0–0.2)

## 2020-05-24 LAB — I-STAT BETA HCG BLOOD, ED (MC, WL, AP ONLY): I-stat hCG, quantitative: <5 m[IU]/mL (ref <5)

## 2020-05-24 NOTE — ED Provider Notes (Signed)
2:20 AM Care assumed at shift change from couture, PA-C.  In short, patient is a 44 year old female with a history of paranoid behavior and schizophrenia who has been experiencing persistent menorrhagia associated with known uterine fibroids.  Has required transfusions for symptomatic anemia in the past.  Hemoglobin has dropped 2.8 points from last check 2 weeks ago.  Was seen by OBGYN in the ED and offered admission, but patient declined.  Per GYN consult note, patient felt stable for outpatient follow up once anemia addressed.  Patient has been transfused with 2 units of PRBCs in the emergency department. Orthostatics have been completed post transfusion and are stable. She has been started on Megace which she will need to continue as an outpatient.  3:08 AM Plan conveyed to patient who verbalizes understanding. Acknowledges need to f/u as outpatient; reports no OBGYN care since her twins were born 27 years ago. Hemodynamically stable on repeat assessment. Return precautions discussed and provided. Patient discharged in stable condition with no unaddressed concerns.   Vitals:   05/23/20 2336 05/23/20 2352 05/24/20 0100 05/24/20 0205  BP: 103/68 (!) 95/54 104/64 105/64  Pulse: 95 82 78 82  Resp: 16 20 19 14   Temp: 97.8 F (36.6 C) 98.2 F (36.8 C)  98.1 F (36.7 C)  TempSrc: Oral Oral  Oral  SpO2: 100% 99% 99% 100%  Weight:      Height:          Antonietta Breach, PA-C 21/58/72 7618    Delora Fuel, MD 48/59/27 [phone]

## 2020-05-24 NOTE — ED Triage Notes (Signed)
Pt reports that she has been having dizziness and weakness that has been going on since yesterday. Pt also reports vaginal bleeding as well, Pt refused EKG in triage.

## 2020-05-25 ENCOUNTER — Other Ambulatory Visit: Payer: Self-pay

## 2020-05-25 LAB — TYPE AND SCREEN
ABO/RH(D): O POS
Antibody Screen: NEGATIVE
Donor AG Type: NEGATIVE
Donor AG Type: NEGATIVE
Unit division: 0
Unit division: 0
Unit division: 0

## 2020-05-25 LAB — RESPIRATORY PANEL BY RT PCR (FLU A&B, COVID)
Influenza A by PCR: NEGATIVE
Influenza B by PCR: NEGATIVE
SARS Coronavirus 2 by RT PCR: NEGATIVE

## 2020-05-25 LAB — URINALYSIS, ROUTINE W REFLEX MICROSCOPIC
Bilirubin Urine: NEGATIVE
Glucose, UA: NEGATIVE mg/dL
Ketones, ur: NEGATIVE mg/dL
Leukocytes,Ua: NEGATIVE
Nitrite: NEGATIVE
Protein, ur: NEGATIVE mg/dL
RBC / HPF: >50 RBC/hpf — ABNORMAL HIGH (ref 0–5)
Specific Gravity, Urine: 1.026 (ref 1.005–1.030)
pH: 5 (ref 5.0–8.0)

## 2020-05-25 LAB — BPAM RBC
Blood Product Expiration Date: 202110282359
Blood Product Expiration Date: 202110282359
Blood Product Expiration Date: 202110292359
ISSUE DATE / TIME: 202109252101
ISSUE DATE / TIME: 202109252327
Unit Type and Rh: 5100
Unit Type and Rh: 5100
Unit Type and Rh: 5100

## 2020-05-25 NOTE — ED Notes (Signed)
C/o not feeling right , states she was here several days ago and c/o dizziness  And today states she just doesn't feel right.

## 2020-05-25 NOTE — ED Notes (Signed)
Attempted to discuss discharge instructions with patient, pt requested to read them herself. Discharged in NAD.

## 2020-05-25 NOTE — ED Provider Notes (Addendum)
[facility] EMERGENCY DEPARTMENT Provider Note   CSN: [id number] Arrival date & time: 05/24/20  2015     History Chief Complaint  Patient presents with  . Dizziness    Valerie Howard is a 44 y.o. female.  HPI 44 year old female presents with generalized weakness.  Unfortunately she is not very forthcoming and seems irritated at most of the questions I asked.  She does endorse that she felt transiently better when given a transfusion on 05/23/2025.  However, she states almost immediately later she started feeling bad again.  She states she feels like she has a cold or the flu.  When asked to describe the symptoms she seems irritated.  When specifically asked she denies fever, cough, chest pain, shortness of breath, vomiting, diarrhea or any type of pain.  She does feel generally weak. Continues to have vaginal bleeding, has not picked up the megace yet. She does not take iron because of side effects.   Past Medical History:  Diagnosis Date  . History of radiation therapy 07/18/17-07/24/17   right ear, operative bed 12 Gy in 3 fractions  . Keloid    right ear  . Paranoid behavior ([place])   . Schizophrenia [facility])     Patient Active Problem List   Diagnosis Date Noted  . Symptomatic anemia 03/18/2020  . Paranoia (psychosis) ([place])   . Acute blood loss anemia 02/27/2019  . 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 Schizoaffective disorder, bipolar type ([place])   . Menorrhagia 05/12/2014  . Personality disorder ([place]) 10/17/2012  . Anemia, iron deficiency 10/13/2012  . Tachycardia 10/01/2012    Past Surgical History:  Procedure Laterality Date  . CESAREAN SECTION    . KENALOG INJECTION Bilateral 07/17/2017   Procedure: KENALOG INJECTION;  Surgeon: Wallace Going, DO;  Location: [place];  Service: Plastics;  Laterality: Bilateral;  . MASS EXCISION N/A 07/17/2017   Procedure: EXCISION KELOID OF RIGHT EAR WITH INTROPERATIVE KENALOG INJECTION AND POST OP  RADIATION;  Surgeon: Wallace Going, DO;  Location: [place];  Service: Plastics;  Laterality: N/A;  . TUBAL LIGATION       OB History   No obstetric history on file.     Family History  Problem Relation Age of Onset  . Hypertension Other   . Diabetes Other   . Cancer Other   . Keloids Mother   . Breast cancer Maternal Grandmother     Social History   Tobacco Use  . Smoking status: Never Smoker  . Smokeless tobacco: Never Used  Vaping Use  . Vaping Use: Never used  Substance Use Topics  . Alcohol use: Yes    Comment: socially  . Drug use: No    Home Medications Prior to Admission medications   Medication Sig Start Date End Date Taking? Authorizing Provider  ferrous sulfate 325 (65 FE) MG tablet Take 1 tablet (325 mg total) by mouth daily. 05/23/20   Couture, Cortni S, PA-C  megestrol (MEGACE) 20 MG tablet Take 1 tablet (20 mg total) by mouth 2 (two) times daily. 05/23/20 06/22/20  Couture, Cortni S, PA-C  polyethylene glycol (MIRALAX / GLYCOLAX) 17 g packet Take 17 g by mouth daily. Patient not taking: Reported on 05/23/2020 03/19/20   [name], DO  senna-docusate (SENOKOT-S) 8.6-50 MG tablet Take 2 tablets by mouth 2 (two) times daily. Patient not taking: Reported on 05/23/2020 03/19/20   [name], DO    Allergies    Penicillins  Review of Systems   Review of Systems  Constitutional: Positive for fatigue. Negative for fever.  Respiratory: Negative for cough and shortness of breath.   Gastrointestinal: Negative for abdominal pain and vomiting.  Genitourinary: Positive for vaginal bleeding.  Neurological: Positive for weakness.  All other systems reviewed and are negative.   Physical Exam Updated Vital Signs BP 122/81 (BP Location: Right Arm)   Pulse 73   Temp 98 F (36.7 C) (Oral)   Resp 17   Ht 5\' 7"  (1.702 m)   Wt 77 kg   LMP 05/21/2020   SpO2 100%   BMI 26.59 kg/m   Physical Exam Vitals and nursing note reviewed.    Constitutional:      General: She is not in acute distress.    Appearance: She is well-developed. She is not ill-appearing or diaphoretic.  HENT:     Head: Normocephalic and atraumatic.     Right Ear: External ear normal.     Left Ear: External ear normal.     Nose: Nose normal.  Eyes:     General:        Right eye: No discharge.        Left eye: No discharge.     Extraocular Movements: Extraocular movements intact.     Pupils: Pupils are equal, round, and reactive to light.  Cardiovascular:     Rate and Rhythm: Normal rate and regular rhythm.     Heart sounds: Normal heart sounds.  Pulmonary:     Effort: Pulmonary effort is normal.     Breath sounds: Normal breath sounds.  Abdominal:     General: There is no distension.     Palpations: Abdomen is soft.     Tenderness: There is no abdominal tenderness.  Skin:    General: Skin is warm and dry.  Neurological:     Mental Status: She is alert.     Comments:  5/5 strength in all 4 extremities. Grossly normal sensation. Normal finger to nose.   Psychiatric:        Mood and Affect: Mood is not anxious.     ED Results / Procedures / Treatments   Labs (all labs ordered are listed, but only abnormal results are displayed) Labs Reviewed  CBC - Abnormal; Notable for the following components:      Result Value   Hemoglobin 9.4 (*)    HCT 32.5 (*)    MCH 23.2 (*)    MCHC 28.9 (*)    RDW 20.4 (*)    All other components within normal limits  URINALYSIS, ROUTINE W REFLEX MICROSCOPIC - Abnormal; Notable for the following components:   APPearance HAZY (*)    Hgb urine dipstick LARGE (*)    RBC / HPF >50 (*)    Bacteria, UA RARE (*)    All other components within normal limits  RESPIRATORY PANEL BY RT PCR (FLU A&B, COVID)  BASIC METABOLIC PANEL  I-STAT BETA HCG BLOOD, ED ([facility], [facility], [facility] ONLY)  CBG MONITORING, ED    EKG None  Radiology DG Chest Portable 1 View  Result Date: 05/23/2020 CLINICAL DATA:  Shortness of breath.  EXAM: PORTABLE CHEST 1 VIEW COMPARISON:  03/18/2020 FINDINGS: The cardiomediastinal contours are normal. The lungs are clear. Pulmonary vasculature is normal. No consolidation, pleural effusion, or pneumothorax. No acute osseous abnormalities are seen. IMPRESSION: Negative AP view of the chest. Electronically Signed   By: Keith Rake M.D.   On: 05/23/2020 18:24    Procedures  Procedures (including critical care time)  Medications Ordered in ED Medications - No data to display  ED Course  I have reviewed the triage vital signs and the nursing notes.  Pertinent labs & imaging results that were available during my care of the patient were reviewed by me and considered in my medical decision making (see chart for details).    MDM Rules/Calculators/A&P                          Patient's vital signs are unremarkable.  Labs have been reviewed and show her hemoglobin has improved.  She does not appear to have signs of severe bleeding right now or shock.  She refuses ECG.  I offered Covid/flu testing because she feels like she is becoming ill but at this point there is no other emergent work-up or treatment needed.  She needs to follow-up with OB/GYN and start the Megace. Final Clinical Impression(s) / ED Diagnoses Final diagnoses:  Generalized weakness    Rx / DC Orders ED Discharge Orders    None       Sherwood Gambler, MD 05/25/20 4854    Sherwood Gambler, MD 05/25/20 [phone]

## 2020-12-28 ENCOUNTER — Encounter (HOSPITAL_COMMUNITY): Payer: Self-pay | Admitting: Student

## 2020-12-28 ENCOUNTER — Other Ambulatory Visit (HOSPITAL_COMMUNITY): Payer: Self-pay

## 2020-12-28 ENCOUNTER — Emergency Department (HOSPITAL_COMMUNITY)
Admission: EM | Admit: 2020-12-28 | Discharge: 2020-12-28 | Disposition: A | Discharge status: Home / Self Care | Payer: Medicaid Other | Attending: Emergency Medicine | Admitting: Emergency Medicine

## 2020-12-28 DIAGNOSIS — R000 Tachycardia, unspecified: Secondary | ICD-10-CM | POA: Diagnosis not present

## 2020-12-28 DIAGNOSIS — N939 Abnormal uterine and vaginal bleeding, unspecified: Secondary | ICD-10-CM | POA: Diagnosis not present

## 2020-12-28 DIAGNOSIS — M7989 Other specified soft tissue disorders: Secondary | ICD-10-CM | POA: Insufficient documentation

## 2020-12-28 DIAGNOSIS — D649 Anemia, unspecified: Secondary | ICD-10-CM | POA: Insufficient documentation

## 2020-12-28 LAB — PREPARE RBC (CROSSMATCH)

## 2020-12-28 LAB — CBC WITH DIFFERENTIAL/PLATELET
Abs Immature Granulocytes: 0.03 10*3/uL (ref 0.00–0.07)
Basophils Absolute: 0 10*3/uL (ref 0.0–0.1)
Basophils Relative: 1 %
Eosinophils Absolute: 0.2 10*3/uL (ref 0.0–0.5)
Eosinophils Relative: 2 %
HCT: 21.8 % — ABNORMAL LOW (ref 36.0–46.0)
Hemoglobin: 5.3 g/dL — CL (ref 12.0–15.0)
Immature Granulocytes: 0 %
Lymphocytes Relative: 32 %
Lymphs Abs: 2.2 10*3/uL (ref 0.7–4.0)
MCH: 15.9 pg — ABNORMAL LOW (ref 26.0–34.0)
MCHC: 24.3 g/dL — ABNORMAL LOW (ref 30.0–36.0)
MCV: 65.3 fL — ABNORMAL LOW (ref 80.0–100.0)
Monocytes Absolute: 0.4 10*3/uL (ref 0.1–1.0)
Monocytes Relative: 6 %
Neutro Abs: 4 10*3/uL (ref 1.7–7.7)
Neutrophils Relative %: 59 %
Platelets: 256 10*3/uL (ref 150–400)
RBC: 3.34 MIL/uL — ABNORMAL LOW (ref 3.87–5.11)
RDW: 26.3 % — ABNORMAL HIGH (ref 11.5–15.5)
WBC: 6.8 10*3/uL (ref 4.0–10.5)
nRBC: 0 % (ref 0.0–0.2)

## 2020-12-28 LAB — COMPREHENSIVE METABOLIC PANEL
ALT: 11 U/L (ref 0–44)
AST: 13 U/L — ABNORMAL LOW (ref 15–41)
Albumin: 3.6 g/dL (ref 3.5–5.0)
Alkaline Phosphatase: 63 U/L (ref 38–126)
Anion gap: 9 (ref 5–15)
BUN: 10 mg/dL (ref 6–20)
CO2: 23 mmol/L (ref 22–32)
Calcium: 8.8 mg/dL — ABNORMAL LOW (ref 8.9–10.3)
Chloride: 106 mmol/L (ref 98–111)
Creatinine, Ser: 0.85 mg/dL (ref 0.44–1.00)
GFR, Estimated: >60 mL/min (ref >60)
Glucose, Bld: 123 mg/dL — ABNORMAL HIGH (ref 70–99)
Potassium: 3.4 mmol/L — ABNORMAL LOW (ref 3.5–5.1)
Sodium: 138 mmol/L (ref 135–145)
Total Bilirubin: 0.4 mg/dL (ref 0.3–1.2)
Total Protein: 7.1 g/dL (ref 6.5–8.1)

## 2020-12-28 LAB — I-STAT BETA HCG BLOOD, ED (MC, WL, AP ONLY): I-stat hCG, quantitative: <5 m[IU]/mL (ref <5)

## 2020-12-28 MED ORDER — TRANEXAMIC ACID-NACL 1000-0.7 MG/100ML-% IV SOLN
1000.0000 mg | Freq: Once | INTRAVENOUS | Status: AC
  Administered 2020-12-28: 1000 mg via INTRAVENOUS
  Filled 2020-12-28: qty 100

## 2020-12-28 MED ORDER — SODIUM CHLORIDE 0.9 % IV SOLN
10.0000 mL/h | Freq: Once | INTRAVENOUS | Status: AC
  Administered 2020-12-28: 10 mL/h via INTRAVENOUS

## 2020-12-28 MED ORDER — FERROUS SULFATE 325 (65 FE) MG PO TABS: 325.0000 mg | ORAL_TABLET | Freq: Every day | ORAL | 0 refills | Status: DC

## 2020-12-28 MED ORDER — TRANEXAMIC ACID 650 MG PO TABS: 1300.0000 mg | ORAL_TABLET | Freq: Three times a day (TID) | ORAL | 0 refills | Status: AC

## 2020-12-28 MED ORDER — POTASSIUM CHLORIDE CRYS ER 20 MEQ PO TBCR
40.0000 meq | EXTENDED_RELEASE_TABLET | Freq: Once | ORAL | Status: AC
  Administered 2020-12-28: 40 meq via ORAL
  Filled 2020-12-28: qty 2

## 2020-12-28 NOTE — ED Notes (Signed)
Blood Consent signed.

## 2020-12-28 NOTE — ED Provider Notes (Signed)
Pt reevaluated during transfusion.  No adverse symptoms.  Pt reports she is feeling better.  Pt advised to follow up as directed.    Fransico Meadow, Hershal Coria 12/28/20 1518    Blanchie Dessert, MD 01/01/21 0730

## 2020-12-28 NOTE — ED Notes (Signed)
Pt ambulated independently to bathroom, upon return to bed pt was c/o shortness of breath, and HR increased to 140s. SpO2 remained above 95% on RA.

## 2020-12-28 NOTE — Discharge Instructions (Addendum)
You were seen in the emergency department today for vaginal bleeding.  Your hemoglobin was very low at 5.3.  You were given a blood transfusion to help with this.  You were also given a medicine through the IV to help stop the bleeding.  We are sending you home with a list he does not to take 2 tablets 3 times per day for the next 5 days as well as an iron supplement to start taking daily  We have prescribed you new medication(s) today. Discuss the medications prescribed today with your pharmacist as they can have adverse effects and interactions with your other medicines including over the counter and prescribed medications. Seek medical evaluation if you start to experience new or abnormal symptoms after taking one of these medicines, seek care immediately if you start to experience difficulty breathing, feeling of your throat closing, facial swelling, or rash as these could be indications of a more serious allergic reaction  Please try elevating your legs and using compression stockings for your leg swelling, please follow-up with your primary care provider for this within 1 week.  Please follow-up with OB/GYN as soon as possible..  Call the office today to make sure that you have a very close follow-up appointment for soon as possible.  Return to the ER for new or worsening symptoms including but not limited to increased bleeding, lightheadedness, dizziness, passing out, chest pain, trouble breathing, fever, or any other concerns.

## 2020-12-28 NOTE — ED Provider Notes (Addendum)
[facility] EMERGENCY DEPARTMENT Provider Note   CSN: [id number] Arrival date & time: 12/28/20  0336     History Chief Complaint  Patient presents with  . Vaginal Bleeding    Valerie Howard is a 45 y.o. female with a history of schizophrenia, menorrhagia, fibroids, and anemia who presents to the emergency department with complaints of vaginal bleeding.  Patient states that her menstrual cycle started 3 days prior, she is having heavy bleeding with clot passage and having to change her pad every hour.  She is not currently taking her iron supplements consistently and is not currently taking hormone.  She states she has a history of heavy periods and has required blood transfusions before.  She has had some fatigue with activity.  Also mentions that her legs have been swollen at times.  She denies fever, chills, abdominal pain, vomiting, shortness of breath, chest pain, dizziness, lightheadedness, or syncope.  She is not currently sexually active, she denies chance of STI.  HPI     Past Medical History:  Diagnosis Date  . History of radiation therapy 07/18/17-07/24/17   right ear, operative bed 12 Gy in 3 fractions  . Keloid    right ear  . Paranoid behavior ([place])   . Schizophrenia [facility])     Patient Active Problem List   Diagnosis Date Noted  . Symptomatic anemia 03/18/2020  . Paranoia (psychosis) ([place])   . Acute blood loss anemia 02/27/2019  . 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 Schizoaffective disorder, bipolar type ([place])   . Menorrhagia 05/12/2014  . Personality disorder ([place]) 10/17/2012  . Anemia, iron deficiency 10/13/2012  . Tachycardia 10/01/2012    Past Surgical History:  Procedure Laterality Date  . CESAREAN SECTION    . KENALOG INJECTION Bilateral 07/17/2017   Procedure: KENALOG INJECTION;  Surgeon: Wallace Going, DO;  Location: [place];  Service: Plastics;  Laterality: Bilateral;  . MASS EXCISION N/A 07/17/2017   Procedure:  EXCISION KELOID OF RIGHT EAR WITH INTROPERATIVE KENALOG INJECTION AND POST OP RADIATION;  Surgeon: Wallace Going, DO;  Location: [place];  Service: Plastics;  Laterality: N/A;  . TUBAL LIGATION       OB History   No obstetric history on file.     Family History  Problem Relation Age of Onset  . Hypertension Other   . Diabetes Other   . Cancer Other   . Keloids Mother   . Breast cancer Maternal Grandmother     Social History   Tobacco Use  . Smoking status: Never Smoker  . Smokeless tobacco: Never Used  Vaping Use  . Vaping Use: Never used  Substance Use Topics  . Alcohol use: Yes    Comment: socially  . Drug use: No    Home Medications Prior to Admission medications   Medication Sig Start Date End Date Taking? Authorizing Provider  ferrous sulfate 325 (65 FE) MG tablet Take 1 tablet (325 mg total) by mouth daily. Patient taking differently: Take 325 mg by mouth daily as needed (constipation). 05/23/20  Yes Couture, Cortni S, PA-C  polyethylene glycol (MIRALAX / GLYCOLAX) 17 g packet Take 17 g by mouth daily. Patient not taking: Reported on 05/23/2020 03/19/20   [name], DO  senna-docusate (SENOKOT-S) 8.6-50 MG tablet Take 2 tablets by mouth 2 (two) times daily. Patient not taking: Reported on 05/23/2020 03/19/20   [name], DO    Allergies    Penicillins  Review of  Systems   Review of Systems  Constitutional: Positive for fatigue. Negative for chills and fever.  Respiratory: Negative for shortness of breath.   Cardiovascular: Positive for leg swelling. Negative for chest pain.  Gastrointestinal: Negative for abdominal pain, nausea and vomiting.  Genitourinary: Positive for vaginal bleeding. Negative for dysuria and vaginal discharge.  Neurological: Negative for dizziness, syncope and light-headedness.  All other systems reviewed and are negative.   Physical Exam Updated Vital Signs BP 132/71   Pulse 98   Temp 98.3 F (36.8  C) (Oral)   Resp 10   SpO2 100%   Physical Exam Vitals and nursing note reviewed.  Constitutional:      General: She is not in acute distress.    Appearance: She is not ill-appearing or toxic-appearing.  HENT:     Head: Normocephalic and atraumatic.      Comments: Dry skin to face Eyes:     Pupils: Pupils are equal, round, and reactive to light.     Comments: Pale conjunctiva.   Cardiovascular:     Rate and Rhythm: Regular rhythm. Tachycardia present.     Comments: 2+ symmetric PT and DP pulses bilaterally. Pulmonary:     Effort: Pulmonary effort is normal. No respiratory distress.     Breath sounds: Normal breath sounds. No stridor. No wheezing, rhonchi or rales.  Abdominal:     General: There is no distension.     Palpations: Abdomen is soft.     Tenderness: There is no abdominal tenderness. There is no guarding or rebound.  Genitourinary:    Comments: Patient declined GU exam. Musculoskeletal:     Cervical back: Neck supple. No rigidity.     Comments: Patient has trace symmetric pitting edema to the bilateral lower legs.  No calf tenderness.  Neurological:     Mental Status: She is alert.     Comments: Clear speech.  Psychiatric:     Comments: Calm.  Cooperative.     ED Results / Procedures / Treatments   Labs (all labs ordered are listed, but only abnormal results are displayed) Labs Reviewed  COMPREHENSIVE METABOLIC PANEL - Abnormal; Notable for the following components:      Result Value   Potassium 3.4 (*)    Glucose, Bld 123 (*)    Calcium 8.8 (*)    AST 13 (*)    All other components within normal limits  CBC WITH DIFFERENTIAL/PLATELET - Abnormal; Notable for the following components:   RBC 3.34 (*)    Hemoglobin 5.3 (*)    HCT 21.8 (*)    MCV 65.3 (*)    MCH 15.9 (*)    MCHC 24.3 (*)    RDW 26.3 (*)    All other components within normal limits  I-STAT BETA HCG BLOOD, ED ([facility], [facility], [facility] ONLY)  TYPE AND SCREEN  PREPARE RBC (CROSSMATCH)     EKG None  Radiology No results found.  Procedures .Critical Care Performed by: Amaryllis Dyke, PA-C Authorized by: Amaryllis Dyke, PA-C    CRITICAL CARE Performed by: Kennith Maes   Total critical care time: 35 minutes  Critical care time was exclusive of separately billable procedures and treating other patients.  Critical care was necessary to treat or prevent imminent or life-threatening deterioration.  Critical care was time spent personally by me on the following activities: development of treatment plan with patient and/or surrogate as well as nursing, discussions with consultants, evaluation of patient's response to treatment, examination of patient, obtaining history  from patient or surrogate, ordering and performing treatments and interventions, ordering and review of laboratory studies, ordering and review of radiographic studies, pulse oximetry and re-evaluation of patient's condition.    Medications Ordered in ED Medications  tranexamic acid (CYKLOKAPRON) IVPB 1,000 mg (1,000 mg Intravenous New Bag/Given 12/28/20 0615)  potassium chloride SA (KLOR-CON) CR tablet 40 mEq (has no administration in time range)  0.9 %  sodium chloride infusion (10 mL/hr Intravenous New Bag/Given 12/28/20 0622)    ED Course  I have reviewed the triage vital signs and the nursing notes.  Pertinent labs & imaging results that were available during my care of the patient were reviewed by me and considered in my medical decision making (see chart for details).    MDM Rules/Calculators/A&P                         Patient presents to the ED with complaints of heavy vaginal bleeding and fatigue.  Also mentions some lower extremity swelling.  Patient is nontoxic, resting comfortably, she is mildly tachycardic, vitals otherwise unremarkable.  On exam abdomen is nontender without peritoneal signs.   Additional history obtained:  Additional history obtained from chart  review & nursing note review.   Lab Tests:  I Ordered, reviewed, and interpreted labs, which included:  CBC: Critical anemia with hemoglobin 5.3 and hematocrit of 29.8. CMP: Mild electrolyte abnormalities as above.  Renal function preserved.  LFTs without significant elevation. Pregnancy test: Negative.  ED Course:  Complaints of LE swelling bilaterally- hx of similar in the past, currently she does have trace to 1+ symmetric pitting edema to bilateral lower legs without calf tenderness, lungs are clear- do not suspect pulmonary edema related to this. She does have trace to 1+ symmetric pitting edema to bilateral lower legs without calf tenderness, lungs are clear- do not suspect pulmonary edema related to this.  Has had prior DVT study in 2021 that was negative for DVT bilaterally, she has no calf tenderness, feel that DVT is less likely.  Renal function and liver function preserved.  Recommended elevation and compression stockings  Patient with symptomatic anemia in the setting of menorrhagia and known fibroids.  She has been seen for this previously.  She has had hospital admissions as well as transfusions in the ED with discharge home.  She is not currently taking any hormone supplements and has been inconsistent with her iron.  I suspect that her schizophrenia may be playing a factor in her ability to follow-up and take medications as prescribed and she also has issues with transportation. Will discuss w/ OBGYN.   05:41: CONSULT: I discussed with Dr. Ilda Basset with OB/GYN, he recommends transfusion in the emergency department giving medicine to help stop bleeding & discharge home. He is in agreement with 2 units, he subsequently recommends giving 1g of IV Lysteda over 10 minutes with subsequent discharge home on Lysteda 1300 mg TID for 5 days. He will coordinate office follow up.   I have placed consult to TOC to help with follow up as well. Blood & Lysteda ordered (called pharmacy for assistance  with Lysteda).   06:30: Patient care signed out to [place] @ change of shift pending transfusion & disposition.   Findings and plan of care discussed with supervising physician Dr. Stark Jock who is in agreement.   Portions of this note were generated with Lobbyist. Dictation errors may occur despite best attempts at proofreading.  Final Clinical Impression(s) /  ED Diagnoses Final diagnoses:  Vaginal bleeding  Symptomatic anemia    Rx / DC Orders ED Discharge Orders    None       Amaryllis Dyke, PA-C 12/28/20 1103    Veryl Speak, MD 12/28/20 0641    Lavert Matousek, Glynda Jaeger, PA-C 12/28/20 1594    Veryl Speak, MD 12/29/20 [phone]

## 2020-12-28 NOTE — Discharge Planning (Signed)
RNCM provided information on Medicaid transportation and attached it to After Visit Summary (AVS).

## 2020-12-28 NOTE — ED Triage Notes (Signed)
Brought via [facility] for heavy vaginal bleeding 1 pad q1hr with clots. C/o bilat leg swelling. Denies any pain

## 2020-12-28 NOTE — ED Notes (Signed)
Social Work assisted this Therapist, sports to give pt a Chief Strategy Officer. Pt was D/C, ETA for taxi from [facility] 10 minutes away.

## 2020-12-30 LAB — BPAM RBC
Blood Product Expiration Date: 202205072359
Blood Product Expiration Date: 202205202359
Blood Product Expiration Date: 202206012359
Blood Product Expiration Date: 202206112359
ISSUE DATE / TIME: 202205020910
ISSUE DATE / TIME: 202205021330
Unit Type and Rh: 5100
Unit Type and Rh: 5100
Unit Type and Rh: 5100
Unit Type and Rh: 9500

## 2020-12-30 LAB — TYPE AND SCREEN
ABO/RH(D): O POS
Antibody Screen: NEGATIVE
Donor AG Type: NEGATIVE
Donor AG Type: NEGATIVE
Donor AG Type: NEGATIVE
Donor AG Type: NEGATIVE
PT AG Type: NEGATIVE
Unit division: 0
Unit division: 0
Unit division: 0
Unit division: 0

## 2021-01-05 ENCOUNTER — Ambulatory Visit: Payer: Medicaid Other | Admitting: Obstetrics and Gynecology

## 2021-01-20 ENCOUNTER — Emergency Department (HOSPITAL_COMMUNITY)
Admission: EM | Admit: 2021-01-20 | Discharge: 2021-01-21 | Disposition: A | Discharge status: Home / Self Care | Payer: Medicaid Other | Source: Home / Self Care | Attending: Emergency Medicine | Admitting: Emergency Medicine

## 2021-01-20 ENCOUNTER — Encounter (HOSPITAL_COMMUNITY): Payer: Self-pay | Admitting: Emergency Medicine

## 2021-01-20 ENCOUNTER — Other Ambulatory Visit: Payer: Self-pay

## 2021-01-20 ENCOUNTER — Emergency Department (HOSPITAL_COMMUNITY)
Admission: EM | Admit: 2021-01-20 | Discharge: 2021-01-20 | Disposition: A | Discharge status: Home / Self Care | Payer: Medicaid Other | Attending: Emergency Medicine | Admitting: Emergency Medicine

## 2021-01-20 DIAGNOSIS — Z923 Personal history of irradiation: Secondary | ICD-10-CM | POA: Insufficient documentation

## 2021-01-20 DIAGNOSIS — Z85828 Personal history of other malignant neoplasm of skin: Secondary | ICD-10-CM | POA: Insufficient documentation

## 2021-01-20 DIAGNOSIS — N939 Abnormal uterine and vaginal bleeding, unspecified: Secondary | ICD-10-CM | POA: Insufficient documentation

## 2021-01-20 DIAGNOSIS — D259 Leiomyoma of uterus, unspecified: Secondary | ICD-10-CM | POA: Insufficient documentation

## 2021-01-20 DIAGNOSIS — N938 Other specified abnormal uterine and vaginal bleeding: Secondary | ICD-10-CM

## 2021-01-20 DIAGNOSIS — D649 Anemia, unspecified: Secondary | ICD-10-CM

## 2021-01-20 LAB — CBC WITH DIFFERENTIAL/PLATELET
Abs Immature Granulocytes: 0.01 10*3/uL (ref 0.00–0.07)
Basophils Absolute: 0 10*3/uL (ref 0.0–0.1)
Basophils Relative: 1 %
Eosinophils Absolute: 0.1 10*3/uL (ref 0.0–0.5)
Eosinophils Relative: 1 %
HCT: 26.9 % — ABNORMAL LOW (ref 36.0–46.0)
Hemoglobin: 7.1 g/dL — ABNORMAL LOW (ref 12.0–15.0)
Immature Granulocytes: 0 %
Lymphocytes Relative: 25 %
Lymphs Abs: 1.4 10*3/uL (ref 0.7–4.0)
MCH: 19.1 pg — ABNORMAL LOW (ref 26.0–34.0)
MCHC: 26.4 g/dL — ABNORMAL LOW (ref 30.0–36.0)
MCV: 72.5 fL — ABNORMAL LOW (ref 80.0–100.0)
Monocytes Absolute: 0.3 10*3/uL (ref 0.1–1.0)
Monocytes Relative: 5 %
Neutro Abs: 3.9 10*3/uL (ref 1.7–7.7)
Neutrophils Relative %: 68 %
Platelets: 193 10*3/uL (ref 150–400)
RBC: 3.71 MIL/uL — ABNORMAL LOW (ref 3.87–5.11)
RDW: 28.9 % — ABNORMAL HIGH (ref 11.5–15.5)
WBC: 5.7 10*3/uL (ref 4.0–10.5)
nRBC: 0 % (ref 0.0–0.2)

## 2021-01-20 LAB — BASIC METABOLIC PANEL
Anion gap: 8 (ref 5–15)
BUN: 11 mg/dL (ref 6–20)
CO2: 21 mmol/L — ABNORMAL LOW (ref 22–32)
Calcium: 8.7 mg/dL — ABNORMAL LOW (ref 8.9–10.3)
Chloride: 110 mmol/L (ref 98–111)
Creatinine, Ser: 0.87 mg/dL (ref 0.44–1.00)
GFR, Estimated: >60 mL/min (ref >60)
Glucose, Bld: 112 mg/dL — ABNORMAL HIGH (ref 70–99)
Potassium: 3.6 mmol/L (ref 3.5–5.1)
Sodium: 139 mmol/L (ref 135–145)

## 2021-01-20 LAB — I-STAT BETA HCG BLOOD, ED (MC, WL, AP ONLY): I-stat hCG, quantitative: <5 m[IU]/mL (ref <5)

## 2021-01-20 MED ORDER — MEGESTROL ACETATE 20 MG PO TABS: 20.0000 mg | ORAL_TABLET | Freq: Two times a day (BID) | ORAL | 0 refills | Status: DC

## 2021-01-20 MED ORDER — SODIUM CHLORIDE 0.9% IV SOLUTION: Freq: Once | INTRAVENOUS | Status: DC

## 2021-01-20 MED ORDER — SODIUM CHLORIDE 0.9 % IV BOLUS
1000.0000 mL | Freq: Once | INTRAVENOUS | Status: AC
  Administered 2021-01-20: 1000 mL via INTRAVENOUS

## 2021-01-20 NOTE — ED Provider Notes (Signed)
[facility] EMERGENCY DEPARTMENT Provider Note   CSN: [id number] Arrival date & time: 01/20/21  0845     History Chief Complaint  Patient presents with  . Vaginal Bleeding    Valerie Howard is a 45 y.o. female.  Pt presents to the ED today with heavy vaginal bleeding.  The pt has a hx of fibroids with heavy vaginal bleeding.  She was here on 5/3 and needed to receive a blood transfusion.  She started her period again today and it is very heavy.  She said she is passing large blood clots.  Pt is homeless with a hx of schizophrenia and has not gone to any of her follow ups.        Past Medical History:  Diagnosis Date  . History of radiation therapy 07/18/17-07/24/17   right ear, operative bed 12 Gy in 3 fractions  . Keloid    right ear  . Paranoid behavior ([place])   . Schizophrenia [facility])     Patient Active Problem List   Diagnosis Date Noted  . Symptomatic anemia 03/18/2020  . Paranoia (psychosis) ([place])   . Acute blood loss anemia 02/27/2019  . 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 Schizoaffective disorder, bipolar type ([place])   . Menorrhagia 05/12/2014  . Personality disorder ([place]) 10/17/2012  . Anemia, iron deficiency 10/13/2012  . Tachycardia 10/01/2012    Past Surgical History:  Procedure Laterality Date  . CESAREAN SECTION    . KENALOG INJECTION Bilateral 07/17/2017   Procedure: KENALOG INJECTION;  Surgeon: Wallace Going, DO;  Location: [place];  Service: Plastics;  Laterality: Bilateral;  . MASS EXCISION N/A 07/17/2017   Procedure: EXCISION KELOID OF RIGHT EAR WITH INTROPERATIVE KENALOG INJECTION AND POST OP RADIATION;  Surgeon: Wallace Going, DO;  Location: [place];  Service: Plastics;  Laterality: N/A;  . TUBAL LIGATION       OB History   No obstetric history on file.     Family History  Problem Relation Age of Onset  . Hypertension Other   . Diabetes Other   . Cancer Other   . Keloids  Mother   . Breast cancer Maternal Grandmother     Social History   Tobacco Use  . Smoking status: Never Smoker  . Smokeless tobacco: Never Used  Vaping Use  . Vaping Use: Never used  Substance Use Topics  . Alcohol use: Yes    Comment: socially  . Drug use: No    Home Medications Prior to Admission medications   Medication Sig Start Date End Date Taking? Authorizing Provider  megestrol (MEGACE) 20 MG tablet Take 1 tablet (20 mg total) by mouth 2 (two) times daily. 01/20/21  Yes Isla Pence, MD  ferrous sulfate 325 (65 FE) MG tablet Take 1 tablet (325 mg total) by mouth daily. 12/28/20   Petrucelli, Samantha R, PA-C  polyethylene glycol (MIRALAX / GLYCOLAX) 17 g packet Take 17 g by mouth daily. Patient not taking: Reported on 05/23/2020 03/19/20   [name], DO  senna-docusate (SENOKOT-S) 8.6-50 MG tablet Take 2 tablets by mouth 2 (two) times daily. Patient not taking: Reported on 05/23/2020 03/19/20   [name], DO    Allergies    Penicillins  Review of Systems   Review of Systems  Genitourinary: Positive for vaginal bleeding.  All other systems reviewed and are negative.   Physical Exam Updated Vital Signs BP (!) 105/59   Pulse 91   Temp (!)  97.5 F (36.4 C) (Oral)   Resp 20   Ht 5\' 7"  (1.702 m)   Wt 74.8 kg   LMP 01/20/2021 (Exact Date)   SpO2 100%   BMI 25.84 kg/m   Physical Exam Vitals and nursing note reviewed.  Constitutional:      Appearance: Normal appearance.  HENT:     Head: Normocephalic and atraumatic.     Right Ear: External ear normal.     Left Ear: External ear normal.     Nose: Nose normal.     Mouth/Throat:     Mouth: Mucous membranes are moist.     Pharynx: Oropharynx is clear.  Eyes:     Extraocular Movements: Extraocular movements intact.     Conjunctiva/sclera: Conjunctivae normal.     Pupils: Pupils are equal, round, and reactive to light.  Cardiovascular:     Rate and Rhythm: Normal rate and regular rhythm.     Pulses:  Normal pulses.     Heart sounds: Normal heart sounds.  Pulmonary:     Effort: Pulmonary effort is normal.     Breath sounds: Normal breath sounds.  Abdominal:     General: Bowel sounds are normal.     Palpations: Abdomen is soft.     Comments: Large fibroid felt abdomen  Genitourinary:    Comments: Pt refused Musculoskeletal:        General: Normal range of motion.     Cervical back: Normal range of motion and neck supple.  Skin:    General: Skin is warm.     Capillary Refill: Capillary refill takes less than 2 seconds.  Neurological:     General: No focal deficit present.     Mental Status: She is alert and oriented to person, place, and time.  Psychiatric:        Mood and Affect: Affect is blunt and angry.        Thought Content: Thought content is paranoid.     ED Results / Procedures / Treatments   Labs (all labs ordered are listed, but only abnormal results are displayed) Labs Reviewed  CBC WITH DIFFERENTIAL/PLATELET - Abnormal; Notable for the following components:      Result Value   RBC 3.71 (*)    Hemoglobin 7.1 (*)    HCT 26.9 (*)    MCV 72.5 (*)    MCH 19.1 (*)    MCHC 26.4 (*)    RDW 28.9 (*)    All other components within normal limits  BASIC METABOLIC PANEL - Abnormal; Notable for the following components:   CO2 21 (*)    Glucose, Bld 112 (*)    Calcium 8.7 (*)    All other components within normal limits  I-STAT BETA HCG BLOOD, ED ([facility], [facility], [facility] ONLY)  TYPE AND SCREEN  PREPARE RBC (CROSSMATCH)    EKG None  Radiology No results found.  Procedures Procedures   Medications Ordered in ED Medications  0.9 %  sodium chloride infusion (Manually program via Guardrails IV Fluids) (has no administration in time range)  sodium chloride 0.9 % bolus 1,000 mL (0 mLs Intravenous Stopped 01/20/21 1110)    ED Course  I have reviewed the triage vital signs and the nursing notes.  Pertinent labs & imaging results that were available during my care of the  patient were reviewed by me and considered in my medical decision making (see chart for details).    MDM Rules/Calculators/A&P  Pt refused the gyn exam.  Hgb is 7.1 which is up from 5.3 on 5/2.  She did receive 2 units on 5/2.  She said she has not seen gyn because she can't walk to the appt.  The last time she was here, TOC saw her and gave her info on transportation services.  She told me she was not interested in those services.  I will get SW involved again to see if there is anything else to do.    SW did speak with patient.  She had made pt an appt for last month which she missed.  She made another appt for 6/13.  If pt needs a ride, [facility] and they can help her get to the appt.  Pt to be d/c after 1 unit of pRBCs.  She is given a rx for megace.   Return if worse.  CRITICAL CARE Performed by: Isla Pence   Total critical care time: 30 minutes  Critical care time was exclusive of separately billable procedures and treating other patients.  Critical care was necessary to treat or prevent imminent or life-threatening deterioration.  Critical care was time spent personally by me on the following activities: development of treatment plan with patient and/or surrogate as well as nursing, discussions with consultants, evaluation of patient's response to treatment, examination of patient, obtaining history from patient or surrogate, ordering and performing treatments and interventions, ordering and review of laboratory studies, ordering and review of radiographic studies, pulse oximetry and re-evaluation of patient's condition.  Final Clinical Impression(s) / ED Diagnoses Final diagnoses:  Dysfunctional uterine bleeding  Uterine leiomyoma, unspecified location    Rx / DC Orders ED Discharge Orders         Ordered    megestrol (MEGACE) 20 MG tablet  2 times daily        01/20/21 1052            Isla Pence, MD 01/20/21 1432

## 2021-01-20 NOTE — ED Notes (Signed)
All appropriate discharge materials reviewed at length with patient. Time for questions provided. Pt has no other questions at this time and verbalizes understanding of all provided materials.  

## 2021-01-20 NOTE — ED Triage Notes (Addendum)
Per ems, pt reporting same, continuous symptoms since being discharged from earlier visit today. Pt reports when she coughs, heavier amount of vaginal bleeding with large clots "like a flood". Pt reports she started her menstrual cycle this morning but heavier than normal and with clots. Pale mucous membranes noted, also nausea. Pt reports episodes of lightheadedness when it feels like the blood is pouring out of her. Pt is homeless at the moment. Pt reports she refused an ultrasound earlier from today's other visit. Hx of tubal ligation per pt.

## 2021-01-20 NOTE — Discharge Planning (Signed)
RNCM met with pt at bedside regarding GYN appointment.  Pt was extremely defensive and questioning the need for GYN.  RNCM educated pt on need for follow-up.  Pt dismissed RNCM without confirming that she would keep appointment.

## 2021-01-20 NOTE — ED Triage Notes (Signed)
Pt to ED via EMS from street, pt is homeless, c/o vaginal bleeding and lower abdominal pain. Recurrent problems with heavy bleeding over the years, but is getting worse.  Reports Still having periods, and is currently on her period and that it started yesterday. Reports having blood clots. Last VS: 147/103, P 107, 100%RA.

## 2021-01-21 ENCOUNTER — Emergency Department (HOSPITAL_COMMUNITY): Payer: Medicaid Other

## 2021-01-21 LAB — TYPE AND SCREEN
ABO/RH(D): O POS
Antibody Screen: NEGATIVE
Donor AG Type: NEGATIVE
Donor AG Type: NEGATIVE
Unit division: 0
Unit division: 0

## 2021-01-21 LAB — CBC
HCT: 26.9 % — ABNORMAL LOW (ref 36.0–46.0)
Hemoglobin: 7.4 g/dL — ABNORMAL LOW (ref 12.0–15.0)
MCH: 20 pg — ABNORMAL LOW (ref 26.0–34.0)
MCHC: 27.5 g/dL — ABNORMAL LOW (ref 30.0–36.0)
MCV: 72.7 fL — ABNORMAL LOW (ref 80.0–100.0)
Platelets: 196 10*3/uL (ref 150–400)
RBC: 3.7 MIL/uL — ABNORMAL LOW (ref 3.87–5.11)
RDW: 27.7 % — ABNORMAL HIGH (ref 11.5–15.5)
WBC: 7.6 10*3/uL (ref 4.0–10.5)
nRBC: 0 % (ref 0.0–0.2)

## 2021-01-21 LAB — COMPREHENSIVE METABOLIC PANEL
ALT: 8 U/L (ref 0–44)
AST: 15 U/L (ref 15–41)
Albumin: 3.4 g/dL — ABNORMAL LOW (ref 3.5–5.0)
Alkaline Phosphatase: 54 U/L (ref 38–126)
Anion gap: 9 (ref 5–15)
BUN: 9 mg/dL (ref 6–20)
CO2: 22 mmol/L (ref 22–32)
Calcium: 8.6 mg/dL — ABNORMAL LOW (ref 8.9–10.3)
Chloride: 107 mmol/L (ref 98–111)
Creatinine, Ser: 0.88 mg/dL (ref 0.44–1.00)
GFR, Estimated: >60 mL/min (ref >60)
Glucose, Bld: 102 mg/dL — ABNORMAL HIGH (ref 70–99)
Potassium: 3.4 mmol/L — ABNORMAL LOW (ref 3.5–5.1)
Sodium: 138 mmol/L (ref 135–145)
Total Bilirubin: 0.9 mg/dL (ref 0.3–1.2)
Total Protein: 6.6 g/dL (ref 6.5–8.1)

## 2021-01-21 LAB — BPAM RBC
Blood Product Expiration Date: 202206242359
Blood Product Expiration Date: 202206252359
ISSUE DATE / TIME: 202205251356
Unit Type and Rh: 9500
Unit Type and Rh: 9500

## 2021-01-21 MED ORDER — FERROUS SULFATE 325 (65 FE) MG PO TABS: 325.0000 mg | ORAL_TABLET | Freq: Every day | ORAL | 0 refills | Status: DC

## 2021-01-21 NOTE — ED Notes (Signed)
[place] Dance movement psychotherapist. States that for the [place] to be done, pt needs to have a full bladder. Pt made aware and communicates understanding.

## 2021-01-21 NOTE — ED Notes (Signed)
Patient verbalizes understanding of discharge instructions. Prescriptions reviewed.  Opportunity for questioning and answers were provided. Armband removed by staff, pt discharged from ED ambulatory.

## 2021-01-21 NOTE — Discharge Instructions (Addendum)
Your fibroids look to be bigger than before, this is likely why bleeding has worsened. Take the megace and metformin to help control bleeding.  I have also sent iron to your pharmacy that will help keep your blood counts up. Follow-up with GYN as scheduled-- it is important to see them so they can manage your bleeding. Return here for new concerns.

## 2021-01-21 NOTE — ED Provider Notes (Signed)
[name] Provider Note   CSN: [id number] Arrival date & time: 01/20/21  2236     History Chief Complaint  Patient presents with  . Vaginal Bleeding    Valerie Howard is a 45 y.o. female.  The history is provided by the patient and medical records.  Vaginal Bleeding   45 y.o. F with hx of paranoid behavior, schizophrenia, uterine fibroids, presenting to the ED for continued vaginal bleeding.  Seen here earlier for same, refused pelvic and [place] but did receive unit PRBC's, started on megace and referred to GYN.  She is currently homeless and has not gone to her follow-up appts in the past.  She returns now because she is still bleeding, clots do come out when she coughs.  She did NOT start the megace.  States she needs bleeding to stop.  Per charting, she was rude to staff at prior visit.  SW attempted to meet with her for additional resources and she was "kicked out" of the room.  Apparently she threw a bloody pad at RN and threw her discharge paperwork in the trash.  She was given cab voucher earlier but did not use it.  Past Medical History:  Diagnosis Date  . History of radiation therapy 07/18/17-07/24/17   right ear, operative bed 12 Gy in 3 fractions  . Keloid    right ear  . Paranoid behavior ([name])   . Schizophrenia [facility])     Patient Active Problem List   Diagnosis Date Noted  . Symptomatic anemia 03/18/2020  . Paranoia (psychosis) ([name])   . Acute blood loss anemia 02/27/2019  . 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 Schizoaffective disorder, bipolar type ([name])   . Menorrhagia 05/12/2014  . Personality disorder ([name]) 10/17/2012  . Anemia, iron deficiency 10/13/2012  . Tachycardia 10/01/2012    Past Surgical History:  Procedure Laterality Date  . CESAREAN SECTION    . KENALOG INJECTION Bilateral 07/17/2017   Procedure: KENALOG INJECTION;  Surgeon: Wallace Going, DO;  Location: [place];  Service: Plastics;   Laterality: Bilateral;  . MASS EXCISION N/A 07/17/2017   Procedure: EXCISION KELOID OF RIGHT EAR WITH INTROPERATIVE KENALOG INJECTION AND POST OP RADIATION;  Surgeon: Wallace Going, DO;  Location: [place];  Service: Plastics;  Laterality: N/A;  . TUBAL LIGATION       OB History   No obstetric history on file.     Family History  Problem Relation Age of Onset  . Hypertension Other   . Diabetes Other   . Cancer Other   . Keloids Mother   . Breast cancer Maternal Grandmother     Social History   Tobacco Use  . Smoking status: Never Smoker  . Smokeless tobacco: Never Used  Vaping Use  . Vaping Use: Never used  Substance Use Topics  . Alcohol use: Yes    Comment: socially  . Drug use: No    Home Medications Prior to Admission medications   Medication Sig Start Date End Date Taking? Authorizing Provider  ferrous sulfate 325 (65 FE) MG tablet Take 1 tablet (325 mg total) by mouth daily. 12/28/20   Petrucelli, Glynda Jaeger, PA-C  megestrol (MEGACE) 20 MG tablet Take 1 tablet (20 mg total) by mouth 2 (two) times daily. 01/20/21   Isla Pence, MD  polyethylene glycol (MIRALAX / GLYCOLAX) 17 g packet Take 17 g by mouth daily. Patient not taking: Reported on 05/23/2020 03/19/20   [name]  N, DO  senna-docusate (SENOKOT-S) 8.6-50 MG tablet Take 2 tablets by mouth 2 (two) times daily. Patient not taking: Reported on 05/23/2020 03/19/20   [name], DO    Allergies    Penicillins  Review of Systems   Review of Systems  Genitourinary: Positive for vaginal bleeding.  All other systems reviewed and are negative.   Physical Exam Updated Vital Signs BP 134/89   Pulse 97   Temp 99.3 F (37.4 C) (Oral)   Resp 16   Ht 5\' 7"  (1.702 m)   Wt 74.8 kg   LMP 01/20/2021 (Exact Date)   SpO2 100%   BMI 25.84 kg/m   Physical Exam Vitals and nursing note reviewed.  Constitutional:      Appearance: She is well-developed.     Comments: Sleeping, awoken  for exam, NAD  HENT:     Head: Normocephalic and atraumatic.  Eyes:     Conjunctiva/sclera: Conjunctivae normal.     Pupils: Pupils are equal, round, and reactive to light.  Cardiovascular:     Rate and Rhythm: Normal rate and regular rhythm.     Heart sounds: Normal heart sounds.  Pulmonary:     Effort: Pulmonary effort is normal. No respiratory distress.     Breath sounds: Normal breath sounds. No rhonchi.  Abdominal:     General: Bowel sounds are normal.     Palpations: Abdomen is soft.     Tenderness: There is no abdominal tenderness. There is no rebound.  Musculoskeletal:        General: Normal range of motion.     Cervical back: Normal range of motion.  Skin:    General: Skin is warm and dry.  Neurological:     Mental Status: She is alert and oriented to person, place, and time.     ED Results / Procedures / Treatments   Labs (all labs ordered are listed, but only abnormal results are displayed) Labs Reviewed  COMPREHENSIVE METABOLIC PANEL - Abnormal; Notable for the following components:      Result Value   Potassium 3.4 (*)    Glucose, Bld 102 (*)    Calcium 8.6 (*)    Albumin 3.4 (*)    All other components within normal limits  CBC - Abnormal; Notable for the following components:   RBC 3.70 (*)    Hemoglobin 7.4 (*)    HCT 26.9 (*)    MCV 72.7 (*)    MCH 20.0 (*)    MCHC 27.5 (*)    RDW 27.7 (*)    All other components within normal limits  TYPE AND SCREEN    EKG None  Radiology No results found.  Procedures Procedures   Medications Ordered in ED Medications - No data to display  ED Course  I have reviewed the triage vital signs and the nursing notes.  Pertinent labs & imaging results that were available during my care of the patient were reviewed by me and considered in my medical decision making (see chart for details).    MDM Rules/Calculators/A&P  45 y.o. F here with continued vaginal bleeding.  She does have longstanding history of  fibroids and has been lost to GYN follow-up.  Seen here earlier, refused pelvic and [place] but did receive 1 unit PRBC's.  She was given prescription for Megace and referred to gynecology, has not yet started her medications.  She returns because "I want the bleeding to stop".   Social work did meet with her earlier  today to help facilitate close follow-up in clinic.  She is afebrile and nontoxic.  Repeat labs with increased hemoglobin from prior and she is HD stable. She is now willing to get [place].  This has been ordered.    [place] with increased size of uterine masses which are suspected fibroids.  Recommended short term follow-up.  She was instructed to take the megace as directed, may benefit from Fe+ supplementation as well so will add this to treatment regimen.  Also tells me she was given metformin for bleeding.  She has appt scheduled with GYN on 02/05/21-- again stressed importance of this follow-up visit and she acknowledged understanding.  Return here for new concerns.  Final Clinical Impression(s) / ED Diagnoses Final diagnoses:  Vaginal bleeding    Rx / DC Orders ED Discharge Orders         Ordered    ferrous sulfate 325 (65 FE) MG tablet  Daily        01/21/21 0547           Larene Pickett, PA-C 01/21/21 0612    Palumbo, April, MD 01/21/21 7096

## 2021-01-22 ENCOUNTER — Other Ambulatory Visit (HOSPITAL_COMMUNITY): Payer: Self-pay

## 2021-01-22 ENCOUNTER — Encounter (HOSPITAL_COMMUNITY): Payer: Self-pay

## 2021-01-22 ENCOUNTER — Emergency Department (HOSPITAL_COMMUNITY)
Admission: EM | Admit: 2021-01-22 | Discharge: 2021-01-22 | Disposition: A | Discharge status: Home / Self Care | Payer: Medicaid Other | Attending: Emergency Medicine | Admitting: Emergency Medicine

## 2021-01-22 DIAGNOSIS — N939 Abnormal uterine and vaginal bleeding, unspecified: Secondary | ICD-10-CM | POA: Diagnosis not present

## 2021-01-22 DIAGNOSIS — D649 Anemia, unspecified: Secondary | ICD-10-CM

## 2021-01-22 LAB — TYPE AND SCREEN
ABO/RH(D): O POS
Antibody Screen: NEGATIVE
Donor AG Type: NEGATIVE
Donor AG Type: NEGATIVE
Unit division: 0
Unit division: 0

## 2021-01-22 LAB — BASIC METABOLIC PANEL
Anion gap: 5 (ref 5–15)
BUN: 14 mg/dL (ref 6–20)
CO2: 25 mmol/L (ref 22–32)
Calcium: 8.8 mg/dL — ABNORMAL LOW (ref 8.9–10.3)
Chloride: 110 mmol/L (ref 98–111)
Creatinine, Ser: 0.86 mg/dL (ref 0.44–1.00)
GFR, Estimated: >60 mL/min (ref >60)
Glucose, Bld: 102 mg/dL — ABNORMAL HIGH (ref 70–99)
Potassium: 4 mmol/L (ref 3.5–5.1)
Sodium: 140 mmol/L (ref 135–145)

## 2021-01-22 LAB — BPAM RBC
Blood Product Expiration Date: 202206022359
Blood Product Expiration Date: 202206252359
Unit Type and Rh: 5100
Unit Type and Rh: 9500

## 2021-01-22 LAB — CBC
HCT: 24.3 % — ABNORMAL LOW (ref 36.0–46.0)
Hemoglobin: 6.7 g/dL — CL (ref 12.0–15.0)
MCH: 20.4 pg — ABNORMAL LOW (ref 26.0–34.0)
MCHC: 27.6 g/dL — ABNORMAL LOW (ref 30.0–36.0)
MCV: 73.9 fL — ABNORMAL LOW (ref 80.0–100.0)
Platelets: 223 10*3/uL (ref 150–400)
RBC: 3.29 MIL/uL — ABNORMAL LOW (ref 3.87–5.11)
RDW: 28.2 % — ABNORMAL HIGH (ref 11.5–15.5)
WBC: 6 10*3/uL (ref 4.0–10.5)
nRBC: 0 % (ref 0.0–0.2)

## 2021-01-22 LAB — PREPARE RBC (CROSSMATCH)

## 2021-01-22 MED ORDER — ESTROGENS CONJUGATED 25 MG IJ SOLR
25.0000 mg | Freq: Once | INTRAMUSCULAR | Status: AC
  Administered 2021-01-22: 25 mg via INTRAVENOUS
  Filled 2021-01-22: qty 25

## 2021-01-22 MED ORDER — TRANEXAMIC ACID 650 MG PO TABS
ORAL_TABLET | ORAL | 0 refills | Status: DC
  Filled 2021-01-22: qty 75, 60d supply, fill #0
  Filled 2021-02-06: qty 75, 11d supply, fill #0

## 2021-01-22 MED ORDER — MEGESTROL ACETATE 40 MG PO TABS
ORAL_TABLET | ORAL | 0 refills | Status: DC
  Filled 2021-01-22: qty 45, 30d supply, fill #0

## 2021-01-22 MED ORDER — SODIUM CHLORIDE 0.9% IV SOLUTION: Freq: Once | INTRAVENOUS | Status: AC

## 2021-01-22 MED ORDER — STERILE WATER FOR INJECTION IJ SOLN
INTRAMUSCULAR | Status: AC
  Administered 2021-01-22: 10 mL
  Filled 2021-01-22: qty 10

## 2021-01-22 NOTE — ED Triage Notes (Addendum)
Pt arrived via EMS, c/o vaginal bleeding. Started Tuesday, worsening since. Denies any pain. States heavy menstrual cycles have been a reoccurring issue for the last few years. This week worsening. States seen at [facility] for same, with no result.

## 2021-01-22 NOTE — ED Notes (Signed)
Lab called to explain pt has clinically significant antibodies, so they have to screen units for compatible blood which takes additional time to locate.

## 2021-01-22 NOTE — ED Notes (Signed)
Pt reports excessive menstrual bleeding with clots starting on 5/24. Also, pt reports discoloration of her skin predominately on the left side of her face and neck.

## 2021-01-22 NOTE — Progress Notes (Signed)
..   Transition of Care [facility]) - Emergency Department Mini Assessment   Patient Details  Name: Valerie Howard MRN: [id number] Date of Birth: [birth year]  Transition of Care [facility]) CM/SW Contact:    Natiya Seelinger C Tarpley-Carter, [place] Phone Number: 01/22/2021, 3:33 PM   Clinical Narrative: TOC CSW will assist with medication pick up and copay per TOC.  Torion Hulgan Tarpley-Carter, MSW, LCSW-A Pronouns:  She, Her, [place] ED Transitions of CareClinical Social Worker [email or web address] [phone]   ED Mini Assessment: What brought you to the Emergency Department? : Vaginal Bleeding  Barriers to Discharge: No Barriers Identified     Means of departure: Not know  Interventions which prevented an admission or readmission: Medication Review    Patient Contact and Communications       Contact Date: 01/22/21,       Call outcome: Medication Assistance  Patient states their goals for this hospitalization and ongoing recovery are:: N/A   Choice offered to / list presented to : NA  Admission diagnosis:  vag bleed Patient Active Problem List   Diagnosis Date Noted  . Symptomatic anemia 03/18/2020  . Paranoia (psychosis) ([place])   . Acute blood loss anemia 02/27/2019  . 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 Schizoaffective disorder, bipolar type ([place])   . Menorrhagia 05/12/2014  . Personality disorder ([place]) 10/17/2012  . Anemia, iron deficiency 10/13/2012  . Tachycardia 10/01/2012   PCP:  Patient, No Pcp Per (Inactive) Pharmacy:  No Pharmacies Listed

## 2021-01-22 NOTE — ED Provider Notes (Signed)
[facility] DEPT Provider Note   CSN: [id number] Arrival date & time: 01/22/21  4580     History Chief Complaint  Patient presents with  . Vaginal Bleeding    Valerie Howard is a 45 y.o. female medical history of schizoaffective disorder, abnormal uterine bleeding bleeding, fibroids.  She has multiple ER visits for her vaginal bleeding and has psychosocial obstacles preventing her from following up appropriately with gynecology.  Patient has had 2 be transfused multiple times has had multiple social work interventions.  Has had appropriate medications and unfortunately has had behavioral issues here in the ER.  It appears she has had previous hospitalizations and has bled down to a hemoglobin of 2.9 in the past.  She most recently had a blood transfusion about 2 weeks ago for her heavy bleeding.  Patient states that she "just wants something to make the bleeding stopped."  She is not feeling significantly more short of breath or presyncopal than usual.  She has heavy bleeding soaking through pads every 1-2 hours.  She states that about a year ago she was given a shot of something that just made it stop and she would like that again.  She has no other complaints at this time.  HPI     Past Medical History:  Diagnosis Date  . History of radiation therapy 07/18/17-07/24/17   right ear, operative bed 12 Gy in 3 fractions  . Keloid    right ear  . Paranoid behavior ([place])   . Schizophrenia [facility])     Patient Active Problem List   Diagnosis Date Noted  . Symptomatic anemia 03/18/2020  . Paranoia (psychosis) ([place])   . Acute blood loss anemia 02/27/2019  . 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 Schizoaffective disorder, bipolar type ([place])   . Menorrhagia 05/12/2014  . Personality disorder ([place]) 10/17/2012  . Anemia, iron deficiency 10/13/2012  . Tachycardia 10/01/2012    Past Surgical History:  Procedure Laterality Date  . CESAREAN SECTION    . KENALOG INJECTION  Bilateral 07/17/2017   Procedure: KENALOG INJECTION;  Surgeon: Wallace Going, DO;  Location: [place];  Service: Plastics;  Laterality: Bilateral;  . MASS EXCISION N/A 07/17/2017   Procedure: EXCISION KELOID OF RIGHT EAR WITH INTROPERATIVE KENALOG INJECTION AND POST OP RADIATION;  Surgeon: Wallace Going, DO;  Location: [place];  Service: Plastics;  Laterality: N/A;  . TUBAL LIGATION       OB History   No obstetric history on file.     Family History  Problem Relation Age of Onset  . Hypertension Other   . Diabetes Other   . Cancer Other   . Keloids Mother   . Breast cancer Maternal Grandmother     Social History   Tobacco Use  . Smoking status: Never Smoker  . Smokeless tobacco: Never Used  Vaping Use  . Vaping Use: Never used  Substance Use Topics  . Alcohol use: Yes    Comment: socially  . Drug use: No    Home Medications Prior to Admission medications   Medication Sig Start Date End Date Taking? Authorizing Provider  ferrous sulfate 325 (65 FE) MG tablet Take 1 tablet (325 mg total) by mouth daily. 01/21/21   Larene Pickett, PA-C  polyethylene glycol (MIRALAX / GLYCOLAX) 17 g packet Take 17 g by mouth daily. Patient not taking: Reported on 05/23/2020 03/19/20   [name], DO  senna-docusate (SENOKOT-S) 8.6-50 MG tablet Take 2  tablets by mouth 2 (two) times daily. Patient not taking: Reported on 05/23/2020 03/19/20   [name], DO    Allergies    Penicillins  Review of Systems   Review of Systems Ten systems reviewed and are negative for acute change, except as noted in the HPI.   Physical Exam Updated Vital Signs BP 129/84 (BP Location: Right Arm)   Pulse 81   Temp 98.5 F (36.9 C) (Oral)   Resp 16   Ht 5\' 7"  (1.702 m)   Wt 74.8 kg   LMP 01/18/2021 (Approximate)   SpO2 100%   BMI 25.83 kg/m   Physical Exam Vitals and nursing note reviewed.  Constitutional:      General: She is not in acute  distress.    Appearance: She is well-developed. She is not diaphoretic.  HENT:     Head: Normocephalic and atraumatic.  Eyes:     General: No scleral icterus.    Conjunctiva/sclera: Conjunctivae normal.  Cardiovascular:     Rate and Rhythm: Normal rate and regular rhythm.     Heart sounds: Normal heart sounds. No murmur heard. No friction rub. No gallop.   Pulmonary:     Effort: Pulmonary effort is normal. No respiratory distress.     Breath sounds: Normal breath sounds.  Abdominal:     General: Bowel sounds are normal. There is no distension.     Palpations: Abdomen is soft. There is no mass.     Tenderness: There is no abdominal tenderness. There is no guarding.  Musculoskeletal:     Cervical back: Normal range of motion.  Skin:    General: Skin is warm and dry.  Neurological:     Mental Status: She is alert and oriented to person, place, and time.  Psychiatric:        Behavior: Behavior normal.     ED Results / Procedures / Treatments   Labs (all labs ordered are listed, but only abnormal results are displayed) Labs Reviewed  CBC - Abnormal; Notable for the following components:      Result Value   RBC 3.29 (*)    Hemoglobin 6.7 (*)    HCT 24.3 (*)    MCV 73.9 (*)    MCH 20.4 (*)    MCHC 27.6 (*)    RDW 28.2 (*)    All other components within normal limits  BASIC METABOLIC PANEL  TYPE AND SCREEN  PREPARE RBC (CROSSMATCH)    EKG None  Radiology US PELVIS (TRANSABDOMINAL ONLY)  Result Date: 01/21/2021 CLINICAL DATA:  Vaginal bleeding.  Last menstrual period 01/19/2021. EXAM: TRANSABDOMINAL ULTRASOUND OF PELVIS TECHNIQUE: Transabdominal ultrasound examination of the pelvis was performed including evaluation of the uterus, ovaries, adnexal regions, and pelvic cul-de-sac. COMPARISON:  Ultrasound pelvis 02/01/2020 FINDINGS: Uterus Measurements: 15.9 x 9.2 x 13.3 cm = volume: 1,012 mL. At least four heterogeneous intramural uterine masses. Query interval increase in  size of the largest lesion located within the left anterior uterine segment now measuring 9.5 x 9.3 x 8.7 cm (from 6.2 x 7.3 x 7 cm). Endometrium Not visualized. Right ovary Not visualized. Left ovary Not visualized. Other findings:  No abnormal free fluid. IMPRESSION: 1. Several intramural uterine masses with interval increase in size of the largest lesion: Left anterior uterine segment now measuring 9.5 x 9.3 x 8.7 cm (from 6.2 x 7.3 x 7 cm). Recommend close term follow-up to evaluate stability. 2. Otherwise limited evaluation with the endometrium and bilateral ovaries not visualized. Electronically  Signed   By: Iven Finn M.D.   On: 01/21/2021 05:19    Procedures .Critical Care Performed by: Margarita Mail, PA-C Authorized by: Margarita Mail, PA-C   Critical care provider statement:    Critical care time (minutes):  70   Critical care time was exclusive of:  Separately billable procedures and treating other patients   Critical care was necessary to treat or prevent imminent or life-threatening deterioration of the following conditions:  Circulatory failure   Critical care was time spent personally by me on the following activities:  Discussions with consultants, evaluation of patient's response to treatment, examination of patient, ordering and performing treatments and interventions, ordering and review of laboratory studies, ordering and review of radiographic studies, pulse oximetry, re-evaluation of patient's condition, obtaining history from patient or surrogate and review of old charts     Medications Ordered in ED Medications  conjugated estrogens (PREMARIN) injection 25 mg (has no administration in time range)  0.9 %  sodium chloride infusion (Manually program via Guardrails IV Fluids) (has no administration in time range)    ED Course  I have reviewed the triage vital signs and the nursing notes.  Pertinent labs & imaging results that were available during my care of the  patient were reviewed by me and considered in my medical decision making (see chart for details).  Clinical Course as of 01/22/21 1548  Fri Jan 22, 2021  3329 Case discussed with Dr. Elly Modena. IM premarin here then megace at d/c. Will add txa.  [AH]    Clinical Course User Index [AH] Margarita Mail, PA-C   MDM Rules/Calculators/A&P                         45 year old female with multiple psychosocial barriers preventing her from following up with OB/GYN for her chronic, recurrent abnormal uterine bleeding.  This is the second time in the past 4 weeks that the patient has needed a blood transfusion for heavy vaginal bleeding.  The patient states that she has been unable to follow-up or get her medications although it appears that she has had multiple ER visits for people have reached out and done a normal enormous job trying to help her.  Today the patient is very compliant.  She was very reasonable and she and I had multiple long conversations about why following up with OB/GYN was of imperative importance.  Ordered and reviewed labs.  Patient has dropped from 7.3 down to 6.7.  She is otherwise hemodynamically stable.  I ordered type and screen and transfuse the patient 1 unit of packed red blood cells here in the emergency department.  I ordered Megace and tranexamic acid.  The patient will receive these in hand.  I have been fervently working with social work all day to get these from the [facility] to the patient.  She is NOT TO LEAVE WITHOUT MEDS IN HAND!. Patient is other wise hemodynamically stable.  Patient appears otherwise appropriate for very close outpatient follow-up with GYN services. Final Clinical Impression(s) / ED Diagnoses Final diagnoses:  None    Rx / DC Orders ED Discharge Orders    None       Margarita Mail, PA-C 01/22/21 1556    Luna Fuse, MD 01/29/21 [phone]

## 2021-01-22 NOTE — Discharge Instructions (Addendum)
Contact a health care provider if you: Have bleeding that lasts for more than 1 week. Feel dizzy at times. Feel nauseous or you vomit. Feel light-headed or weak. Notice any other changes that show that your condition is getting worse. Get help right away if you: Pass out. Have bleeding that soaks through a pad every hour. Have pain in the abdomen. Have a fever or chills. Become sweaty or weak. Pass large blood clots from your vagina.

## 2021-01-22 NOTE — ED Notes (Addendum)
Per CSW, pt was given home meds.

## 2021-01-22 NOTE — ED Notes (Signed)
Patient was given cheese, crackers and ginger ale. She was also provided with wash cloths,liquid soap, a towel, mesh panties and pads. She said she needed to wash up since she was bleeding.

## 2021-01-25 LAB — BPAM RBC
Blood Product Expiration Date: 202206292359
ISSUE DATE / TIME: 202205271236
Unit Type and Rh: 5100

## 2021-01-25 LAB — TYPE AND SCREEN
ABO/RH(D): O POS
Antibody Screen: NEGATIVE
Donor AG Type: NEGATIVE
Unit division: 0

## 2021-02-06 ENCOUNTER — Other Ambulatory Visit (HOSPITAL_COMMUNITY): Payer: Self-pay

## 2021-02-08 ENCOUNTER — Ambulatory Visit: Payer: Medicaid Other | Admitting: Obstetrics and Gynecology

## 2021-04-01 ENCOUNTER — Other Ambulatory Visit: Payer: Self-pay

## 2021-04-01 ENCOUNTER — Observation Stay (HOSPITAL_COMMUNITY)
Admission: EM | Admit: 2021-04-01 | Discharge: 2021-04-02 | Disposition: A | Discharge status: Home / Self Care | Payer: Medicaid Other | Attending: Family Medicine | Admitting: Family Medicine

## 2021-04-01 ENCOUNTER — Encounter (HOSPITAL_COMMUNITY): Payer: Self-pay | Admitting: Emergency Medicine

## 2021-04-01 DIAGNOSIS — F250 Schizoaffective disorder, bipolar type: Secondary | ICD-10-CM | POA: Diagnosis present

## 2021-04-01 DIAGNOSIS — N939 Abnormal uterine and vaginal bleeding, unspecified: Secondary | ICD-10-CM | POA: Diagnosis present

## 2021-04-01 DIAGNOSIS — D62 Acute posthemorrhagic anemia: Secondary | ICD-10-CM | POA: Diagnosis present

## 2021-04-01 DIAGNOSIS — N921 Excessive and frequent menstruation with irregular cycle: Principal | ICD-10-CM

## 2021-04-01 DIAGNOSIS — Z20822 Contact with and (suspected) exposure to covid-19: Secondary | ICD-10-CM | POA: Insufficient documentation

## 2021-04-01 DIAGNOSIS — D509 Iron deficiency anemia, unspecified: Secondary | ICD-10-CM | POA: Diagnosis not present

## 2021-04-01 DIAGNOSIS — Z79899 Other long term (current) drug therapy: Secondary | ICD-10-CM | POA: Diagnosis not present

## 2021-04-01 DIAGNOSIS — R102 Pelvic and perineal pain: Secondary | ICD-10-CM | POA: Diagnosis not present

## 2021-04-01 DIAGNOSIS — D500 Iron deficiency anemia secondary to blood loss (chronic): Secondary | ICD-10-CM

## 2021-04-01 DIAGNOSIS — D649 Anemia, unspecified: Secondary | ICD-10-CM | POA: Diagnosis present

## 2021-04-01 DIAGNOSIS — F609 Personality disorder, unspecified: Secondary | ICD-10-CM | POA: Diagnosis present

## 2021-04-01 DIAGNOSIS — F22 Delusional disorders: Secondary | ICD-10-CM | POA: Diagnosis present

## 2021-04-01 LAB — FERRITIN: Ferritin: 1 ng/mL — ABNORMAL LOW (ref 11–307)

## 2021-04-01 LAB — COMPREHENSIVE METABOLIC PANEL
ALT: 7 U/L (ref 0–44)
AST: 11 U/L — ABNORMAL LOW (ref 15–41)
Albumin: 3.7 g/dL (ref 3.5–5.0)
Alkaline Phosphatase: 50 U/L (ref 38–126)
Anion gap: 6 (ref 5–15)
BUN: 13 mg/dL (ref 6–20)
CO2: 24 mmol/L (ref 22–32)
Calcium: 9.3 mg/dL (ref 8.9–10.3)
Chloride: 110 mmol/L (ref 98–111)
Creatinine, Ser: 0.87 mg/dL (ref 0.44–1.00)
GFR, Estimated: >60 mL/min (ref >60)
Glucose, Bld: 103 mg/dL — ABNORMAL HIGH (ref 70–99)
Potassium: 4.2 mmol/L (ref 3.5–5.1)
Sodium: 140 mmol/L (ref 135–145)
Total Bilirubin: 0.6 mg/dL (ref 0.3–1.2)
Total Protein: 7.2 g/dL (ref 6.5–8.1)

## 2021-04-01 LAB — IRON AND TIBC
Iron: 10 ug/dL — ABNORMAL LOW (ref 28–170)
Saturation Ratios: 2 % — ABNORMAL LOW (ref 10.4–31.8)
TIBC: 412 ug/dL (ref 250–450)
UIBC: 402 ug/dL

## 2021-04-01 LAB — CBC WITH DIFFERENTIAL/PLATELET
Abs Immature Granulocytes: 0.02 10*3/uL (ref 0.00–0.07)
Basophils Absolute: 0 10*3/uL (ref 0.0–0.1)
Basophils Relative: 0 %
Eosinophils Absolute: 0 10*3/uL (ref 0.0–0.5)
Eosinophils Relative: 0 %
HCT: 19 % — ABNORMAL LOW (ref 36.0–46.0)
Hemoglobin: 4.8 g/dL — CL (ref 12.0–15.0)
Immature Granulocytes: 0 %
Lymphocytes Relative: 32 %
Lymphs Abs: 1.7 10*3/uL (ref 0.7–4.0)
MCH: 16.7 pg — ABNORMAL LOW (ref 26.0–34.0)
MCHC: 25.3 g/dL — ABNORMAL LOW (ref 30.0–36.0)
MCV: 66 fL — ABNORMAL LOW (ref 80.0–100.0)
Monocytes Absolute: 0.3 10*3/uL (ref 0.1–1.0)
Monocytes Relative: 6 %
Neutro Abs: 3.2 10*3/uL (ref 1.7–7.7)
Neutrophils Relative %: 62 %
Platelets: 273 10*3/uL (ref 150–400)
RBC: 2.88 MIL/uL — ABNORMAL LOW (ref 3.87–5.11)
RDW: 22.6 % — ABNORMAL HIGH (ref 11.5–15.5)
WBC: 5.3 10*3/uL (ref 4.0–10.5)
nRBC: 0 % (ref 0.0–0.2)

## 2021-04-01 LAB — RETICULOCYTES
Immature Retic Fract: 18.9 % — ABNORMAL HIGH (ref 2.3–15.9)
RBC.: 2.9 MIL/uL — ABNORMAL LOW (ref 3.87–5.11)
Retic Count, Absolute: 21.7 10*3/uL (ref 19.0–186.0)
Retic Ct Pct: 0.8 % (ref 0.4–3.1)

## 2021-04-01 LAB — RESP PANEL BY RT-PCR (FLU A&B, COVID) ARPGX2
Influenza A by PCR: NEGATIVE
Influenza B by PCR: NEGATIVE
SARS Coronavirus 2 by RT PCR: NEGATIVE

## 2021-04-01 LAB — TSH: TSH: 0.689 u[IU]/mL (ref 0.350–4.500)

## 2021-04-01 LAB — MAGNESIUM: Magnesium: 2.2 mg/dL (ref 1.7–2.4)

## 2021-04-01 LAB — LIPASE, BLOOD: Lipase: 23 U/L (ref 11–51)

## 2021-04-01 LAB — I-STAT BETA HCG BLOOD, ED (MC, WL, AP ONLY): I-stat hCG, quantitative: <5 m[IU]/mL (ref <5)

## 2021-04-01 LAB — PREPARE RBC (CROSSMATCH)

## 2021-04-01 MED ORDER — FENTANYL CITRATE (PF) 100 MCG/2ML IJ SOLN
50.0000 ug | Freq: Once | INTRAMUSCULAR | Status: AC
  Administered 2021-04-01: 50 ug via INTRAVENOUS
  Filled 2021-04-01: qty 2

## 2021-04-01 MED ORDER — SODIUM CHLORIDE 0.9 % IV SOLN
10.0000 mL/h | Freq: Once | INTRAVENOUS | Status: AC
  Administered 2021-04-01: 10 mL/h via INTRAVENOUS

## 2021-04-01 MED ORDER — SODIUM CHLORIDE 0.9% FLUSH: 3.0000 mL | Freq: Two times a day (BID) | INTRAVENOUS | Status: DC

## 2021-04-01 MED ORDER — MEGESTROL ACETATE 40 MG PO TABS
40.0000 mg | ORAL_TABLET | Freq: Two times a day (BID) | ORAL | Status: DC
  Administered 2021-04-01: 40 mg via ORAL
  Filled 2021-04-01 (×2): qty 1

## 2021-04-01 MED ORDER — ACETAMINOPHEN 650 MG RE SUPP: 650.0000 mg | Freq: Four times a day (QID) | RECTAL | Status: DC | PRN

## 2021-04-01 MED ORDER — ACETAMINOPHEN 325 MG PO TABS: 650.0000 mg | ORAL_TABLET | Freq: Four times a day (QID) | ORAL | Status: DC | PRN

## 2021-04-01 MED ORDER — SODIUM CHLORIDE 0.9 % IV BOLUS
1000.0000 mL | Freq: Once | INTRAVENOUS | Status: AC
  Administered 2021-04-01: 1000 mL via INTRAVENOUS

## 2021-04-01 MED ORDER — SODIUM CHLORIDE 0.9 % IV SOLN: INTRAVENOUS | Status: DC

## 2021-04-01 MED ORDER — ONDANSETRON HCL 4 MG PO TABS: 4.0000 mg | ORAL_TABLET | Freq: Four times a day (QID) | ORAL | Status: DC | PRN

## 2021-04-01 MED ORDER — FERROUS SULFATE 325 (65 FE) MG PO TABS
325.0000 mg | ORAL_TABLET | Freq: Two times a day (BID) | ORAL | Status: DC
  Administered 2021-04-01: 325 mg via ORAL
  Filled 2021-04-01: qty 1

## 2021-04-01 MED ORDER — ONDANSETRON HCL 4 MG/2ML IJ SOLN: 4.0000 mg | Freq: Four times a day (QID) | INTRAMUSCULAR | Status: DC | PRN

## 2021-04-01 NOTE — H&P (Signed)
History and Physical    Valerie Howard [id number] DOB: Jan 25, 1976 DOA: 04/01/2021  PCP: Patient, No Pcp Per (Inactive)  Patient coming from: Home  I have personally briefly reviewed patient's old medical records in [place]  Chief Complaint: Excessive vaginal bleeding  HPI: Valerie Howard is a 45 y.o. female with medical history significant of schizophrenia, paranoid behavior, metromenorrhagia and uterine fibroids presented to ED with persistent vaginal bleeding.  According to patient, she was following with gynecologist up until few months ago and was taking Megace as well but then she lost to follow-up due to some psychiatric issues and stopped taking medications as well since she did not have any refills.  She tells me that she has periods about twice a month and every time the last about 7 to 10 days and often days, she has to change pads every 1-2 hours.  She has not been taking her iron pills.  She finally decided to come to the emergency department.  She denies any other complaints such as chest pain, shortness of breath, fever, chills, abdominal pain or any other complaint.  She does have some weakness and exertional shortness of breath.  ED Course: Upon arrival to ED, she was hemodynamically stable.  Her hemoglobin was 4.8.  ED physician discussed case with on-call gynecologist who recommended resuming Megace at 40 mg p.o. twice daily and transfusing and admitting under medicine service and they will follow-up with her as outpatient.  COVID test is pending.  Review of Systems: As per HPI otherwise negative.    Past Medical History:  Diagnosis Date   History of radiation therapy 07/18/17-07/24/17   right ear, operative bed 12 Gy in 3 fractions   Keloid    right ear   Paranoid behavior ([place])    Schizophrenia ([place])     Past Surgical History:  Procedure Laterality Date   CESAREAN SECTION     KENALOG INJECTION Bilateral 07/17/2017   Procedure: KENALOG INJECTION;   Surgeon: Wallace Going, DO;  Location: [facility];  Service: Plastics;  Laterality: Bilateral;   MASS EXCISION N/A 07/17/2017   Procedure: EXCISION KELOID OF RIGHT EAR WITH INTROPERATIVE KENALOG INJECTION AND POST OP RADIATION;  Surgeon: Wallace Going, DO;  Location: [facility];  Service: Plastics;  Laterality: N/A;   TUBAL LIGATION       reports that she has never smoked. She has never used smokeless tobacco. She reports current alcohol use. She reports that she does not use drugs.  Allergies  Allergen Reactions   Penicillins Hives and Itching    Has patient had a PCN reaction causing immediate rash, facial/tongue/throat swelling, SOB or lightheadedness with hypotension:Patient refuses to answer (PRA) Has patient had a PCN reaction causing severe rash involving mucus membranes or skin necrosis:PRA Has patient had a PCN reaction that required hospitalization PRA Has patient had a PCN reaction occurring within the last 10 years:PRA If all of the above answers are "NO", then may proceed with Cephalosporin use.     Family History  Problem Relation Age of Onset   Hypertension Other    Diabetes Other    Cancer Other    Keloids Mother    Breast cancer Maternal Grandmother     Prior to Admission medications   Medication Sig Start Date End Date Taking? Authorizing Provider  ferrous sulfate 325 (65 FE) MG tablet Take 1 tablet (325 mg total) by mouth daily. Patient not taking: No sig reported 01/21/21  Larene Pickett, PA-C  megestrol (MEGACE) 40 MG tablet Take 3 tablets daily for 5 days, take 2 tablets daily for 5 days then take 1 tablet daily for up to 1 month Patient not taking: Reported on 04/01/2021 01/22/21   Margarita Mail, PA-C  polyethylene glycol (MIRALAX / GLYCOLAX) 17 g packet Take 17 g by mouth daily. Patient not taking: No sig reported 03/19/20   [name], DO  senna-docusate (SENOKOT-S) 8.6-50 MG tablet Take 2 tablets by mouth 2  (two) times daily. Patient not taking: No sig reported 03/19/20   [name], DO  tranexamic acid (LYSTEDA) 650 MG TABS tablet Take 2 tablets by mouth 3 times a day for 5 days , then 2 tablets 2 times a day for 5 days, and then 1 tablet daily. Patient not taking: Reported on 04/01/2021 01/22/21   Margarita Mail, PA-C    Physical Exam: Vitals:   04/01/21 1443 04/01/21 1500 04/01/21 1520 04/01/21 1553  BP: 129/88 130/70 113/62 (!) 112/58  Pulse: 88 97 93 94  Resp: '18 19 16 '$ (!) 26  Temp:      TempSrc:      SpO2: 100% 100% 100% 100%    Constitutional: NAD, calm, comfortable Vitals:   04/01/21 1443 04/01/21 1500 04/01/21 1520 04/01/21 1553  BP: 129/88 130/70 113/62 (!) 112/58  Pulse: 88 97 93 94  Resp: '18 19 16 '$ (!) 26  Temp:      TempSrc:      SpO2: 100% 100% 100% 100%   Eyes: PERRL, lids and conjunctivae normal ENMT: Mucous membranes are moist. Posterior pharynx clear of any exudate or lesions.Normal dentition.  Neck: normal, supple, no masses, no thyromegaly Respiratory: clear to auscultation bilaterally, no wheezing, no crackles. Normal respiratory effort. No accessory muscle use.  Cardiovascular: Regular rate and rhythm, no murmurs / rubs / gallops. No extremity edema. 2+ pedal pulses. No carotid bruits.  Abdomen: no tenderness, no masses palpated. No hepatosplenomegaly. Bowel sounds positive.  Musculoskeletal: no clubbing / cyanosis. No joint deformity upper and lower extremities. Good ROM, no contractures. Normal muscle tone.  Skin: no rashes, lesions, ulcers. No induration Neurologic: CN 2-12 grossly intact. Sensation intact, DTR normal. Strength 5/5 in all 4.     Labs on Admission: I have personally reviewed following labs and imaging studies  CBC: Recent Labs  Lab 04/01/21 1359  WBC 5.3  NEUTROABS 3.2  HGB 4.8*  HCT 19.0*  MCV 66.0*  PLT 123456   Basic Metabolic Panel: Recent Labs  Lab 04/01/21 1359  NA 140  K 4.2  CL 110  CO2 24  GLUCOSE 103*  BUN 13   CREATININE 0.87  CALCIUM 9.3   GFR: CrCl cannot be calculated (Unknown ideal weight.). Liver Function Tests: Recent Labs  Lab 04/01/21 1359  AST 11*  ALT 7  ALKPHOS 50  BILITOT 0.6  PROT 7.2  ALBUMIN 3.7   Recent Labs  Lab 04/01/21 1359  LIPASE 23   No results for input(s): AMMONIA in the last 168 hours. Coagulation Profile: No results for input(s): INR, PROTIME in the last 168 hours. Cardiac Enzymes: No results for input(s): CKTOTAL, CKMB, CKMBINDEX, TROPONINI in the last 168 hours. BNP (last 3 results) No results for input(s): PROBNP in the last 8760 hours. HbA1C: No results for input(s): HGBA1C in the last 72 hours. CBG: No results for input(s): GLUCAP in the last 168 hours. Lipid Profile: No results for input(s): CHOL, HDL, LDLCALC, TRIG, CHOLHDL, LDLDIRECT in the last 72  hours. Thyroid Function Tests: No results for input(s): TSH, T4TOTAL, FREET4, T3FREE, THYROIDAB in the last 72 hours. Anemia Panel: Recent Labs    04/01/21 1535  RETICCTPCT 0.8   Urine analysis:    Component Value Date/Time   COLORURINE YELLOW 05/25/2020 0246   APPEARANCEUR HAZY (A) 05/25/2020 0246   LABSPEC 1.026 05/25/2020 0246   PHURINE 5.0 05/25/2020 0246   GLUCOSEU NEGATIVE 05/25/2020 0246   HGBUR LARGE (A) 05/25/2020 0246   BILIRUBINUR NEGATIVE 05/25/2020 0246   KETONESUR NEGATIVE 05/25/2020 0246   PROTEINUR NEGATIVE 05/25/2020 0246   UROBILINOGEN 0.2 12/06/2011 0034   NITRITE NEGATIVE 05/25/2020 0246   LEUKOCYTESUR NEGATIVE 05/25/2020 0246    Radiological Exams on Admission: No results found.  EKG: Independently reviewed.  Sinus rhythm with no acute ST-T wave changes.  Assessment/Plan Active Problems:   Anemia, iron deficiency   Personality disorder (HCC)   Schizoaffective disorder, bipolar type ([place])   Menorrhagia with irregular cycle   Acute blood loss anemia   Paranoia (psychosis) (HCC)   Symptomatic anemia   Generalized weakness/acute blood loss  anemia/symptomatic anemia/uterine fibroid/menorrhagia: ED physician has already ordered 3 units of PRBC transfusion.  We will recheck her hemoglobin after that.  We will start her on Megace 40 mg p.o. twice daily as well as iron pills twice daily.  ED physician discussed the case with on-call gynecologist Dr. Arlina Robes who will arrange follow-up for her.  Patient had her pelvic ultrasound done in May of this year which showed multiple fibroids.  No need to repeat ultrasound.  Will defer to gynecologist as outpatient.  Multiple psychiatric issues including schizophrenia/paranoid behavior: She does not seem to be on any medications.  She does not want to discuss this with me.  DVT prophylaxis: SCDs Start: 04/01/21 1635 Code Status: Full code Family Communication: None present at bedside.  Plan of care discussed with patient in length and he verbalized understanding and agreed with it. Disposition Plan: Potential discharge tomorrow. Consults called: [place] s[place] on-call gynecologist by EDP Admission status: Observation   Status is: Observation  The patient remains OBS appropriate and will d/c before 2 midnights.  Dispo: The patient is from: Home              Anticipated d/c is to: Home              Patient currently is not medically stable to d/c.   Difficult to place patient No   Darliss Cheney MD [facility]  04/01/2021, 4:41 PM  To contact the attending provider between 7A-7P or the covering provider during after hours 7P-7A, please log into the web site [email or web address]

## 2021-04-01 NOTE — ED Provider Notes (Addendum)
[place] [facility] DEPT Provider Note   CSN: [id number] Arrival date & time: 04/01/21  1336     History Chief Complaint  Patient presents with   Abdominal Pain   Vaginal Bleeding    Valerie Howard is a 44 y.o. female with past medical history of schizoaffective disorder, abnormal uterine bleeding, bleeding fibroids with multiple past ER visits due to her vaginal bleeding.  Unfortunately patient has had multiple psychosocial obstacles preventing her from follow-up from gynecology, was lost to follow-up.  Per chart review patient has had to have multiple transfusions in the ER, hemoglobin has been 2.9 in the past.  States that vaginal bleeding started yesterday, is worsening today.  States that she is bleeding through pads every hour.  Patient states that she did not take any other medications that were prescribed to her last time she was here.  Is not on iron.  Patient appears extremely sleepy on exam, also admits to some abdominal pain in her lower abdomen.  States this is normal for her when she this type of bleeding. Denies any vaginal discharge. NO CP or SOB.  No dizziness.  No other complaints at this time.  Patient states that her last menstrual cycle was 2 weeks ago, however has been occurring every 2 weeks.  HPI     Past Medical History:  Diagnosis Date   History of radiation therapy 07/18/17-07/24/17   right ear, operative bed 12 Gy in 3 fractions   Keloid    right ear   Paranoid behavior ([place])    Schizophrenia ([place])     Patient Active Problem List   Diagnosis Date Noted   Symptomatic anemia 03/18/2020   Paranoia (psychosis) ([place])    Acute blood loss anemia 02/27/2019   Keloid scar 07/12/2017   Schizoaffective disorder, bipolar type ([place])    Menorrhagia 05/12/2014   Personality disorder ([place]) 10/17/2012   Anemia, iron deficiency 10/13/2012   Tachycardia 10/01/2012    Past Surgical History:  Procedure Laterality Date   CESAREAN SECTION      KENALOG INJECTION Bilateral 07/17/2017   Procedure: KENALOG INJECTION;  Surgeon: Wallace Going, DO;  Location: [facility];  Service: Plastics;  Laterality: Bilateral;   MASS EXCISION N/A 07/17/2017   Procedure: EXCISION KELOID OF RIGHT EAR WITH INTROPERATIVE KENALOG INJECTION AND POST OP RADIATION;  Surgeon: Wallace Going, DO;  Location: [facility];  Service: Plastics;  Laterality: N/A;   TUBAL LIGATION       OB History   No obstetric history on file.     Family History  Problem Relation Age of Onset   Hypertension Other    Diabetes Other    Cancer Other    Keloids Mother    Breast cancer Maternal Grandmother     Social History   Tobacco Use   Smoking status: Never   Smokeless tobacco: Never  Vaping Use   Vaping Use: Never used  Substance Use Topics   Alcohol use: Yes    Comment: socially   Drug use: No    Home Medications Prior to Admission medications   Medication Sig Start Date End Date Taking? Authorizing Provider  ferrous sulfate 325 (65 FE) MG tablet Take 1 tablet (325 mg total) by mouth daily. Patient not taking: No sig reported 01/21/21   Larene Pickett, PA-C  megestrol (MEGACE) 40 MG tablet Take 3 tablets daily for 5 days, take 2 tablets daily for 5 days then take 1 tablet daily  for up to 1 month Patient not taking: Reported on 04/01/2021 01/22/21   Margarita Mail, PA-C  polyethylene glycol (MIRALAX / GLYCOLAX) 17 g packet Take 17 g by mouth daily. Patient not taking: No sig reported 03/19/20   [name], DO  senna-docusate (SENOKOT-S) 8.6-50 MG tablet Take 2 tablets by mouth 2 (two) times daily. Patient not taking: No sig reported 03/19/20   [name], DO  tranexamic acid (LYSTEDA) 650 MG TABS tablet Take 2 tablets by mouth 3 times a day for 5 days , then 2 tablets 2 times a day for 5 days, and then 1 tablet daily. Patient not taking: Reported on 04/01/2021 01/22/21   Margarita Mail, PA-C    Allergies     Penicillins  Review of Systems   Review of Systems  Constitutional:  Negative for chills, diaphoresis, fatigue and fever.  HENT:  Negative for congestion, sore throat and trouble swallowing.   Eyes:  Negative for pain and visual disturbance.  Respiratory:  Negative for cough, shortness of breath and wheezing.   Cardiovascular:  Negative for chest pain, palpitations and leg swelling.  Gastrointestinal:  Negative for abdominal distention, abdominal pain, diarrhea, nausea and vomiting.  Genitourinary:  Positive for menstrual problem and vaginal bleeding. Negative for difficulty urinating, enuresis, flank pain and genital sores.  Musculoskeletal:  Negative for back pain, neck pain and neck stiffness.  Skin:  Negative for pallor.  Neurological:  Negative for dizziness, speech difficulty, weakness and headaches.  Psychiatric/Behavioral:  Negative for confusion.    Physical Exam Updated Vital Signs BP 130/70   Pulse 97   Temp 98.8 F (37.1 C) (Oral)   Resp 19   SpO2 100%   Physical Exam Constitutional:      General: She is not in acute distress.    Appearance: Normal appearance. She is not ill-appearing, toxic-appearing or diaphoretic.     Comments: Patient is extremely sleepy, will wake up for exam and respond appropriately.  HENT:     Mouth/Throat:     Mouth: Mucous membranes are moist.     Pharynx: Oropharynx is clear.  Eyes:     General: No scleral icterus.    Extraocular Movements: Extraocular movements intact.     Pupils: Pupils are equal, round, and reactive to light.  Cardiovascular:     Rate and Rhythm: Normal rate and regular rhythm.     Pulses: Normal pulses.     Heart sounds: Normal heart sounds.  Pulmonary:     Effort: Pulmonary effort is normal. No respiratory distress.     Breath sounds: Normal breath sounds. No stridor. No wheezing, rhonchi or rales.  Chest:     Chest wall: No tenderness.  Abdominal:     General: Abdomen is flat. There is no distension.      Palpations: Abdomen is soft.     Tenderness: There is no abdominal tenderness. There is no guarding or rebound.  Genitourinary:    Comments: Chaperone present.  Vaginal exam with multiple large bright red blood clots coming from cervix.  Unable to complete pelvic exam due to patient's discomfort.  Cervix is closed. Musculoskeletal:        General: No swelling or tenderness. Normal range of motion.     Cervical back: Normal range of motion and neck supple. No rigidity.     Right lower leg: No edema.     Left lower leg: No edema.  Skin:    General: Skin is warm and dry.  Capillary Refill: Capillary refill takes less than 2 seconds.     Coloration: Skin is not pale.  Neurological:     General: No focal deficit present.     Mental Status: She is alert and oriented to person, place, and time.  Psychiatric:        Mood and Affect: Mood normal.        Behavior: Behavior normal.    ED Results / Procedures / Treatments   Labs (all labs ordered are listed, but only abnormal results are displayed) Labs Reviewed  COMPREHENSIVE METABOLIC PANEL - Abnormal; Notable for the following components:      Result Value   Glucose, Bld 103 (*)    AST 11 (*)    All other components within normal limits  WET PREP, GENITAL  LIPASE, BLOOD  CBC WITH DIFFERENTIAL/PLATELET  URINALYSIS, ROUTINE W REFLEX MICROSCOPIC  I-STAT BETA HCG BLOOD, ED ([facility], [facility], [facility] ONLY)  TYPE AND SCREEN  GC/CHLAMYDIA PROBE AMP ([place]) NOT AT [facility]    EKG EKG Interpretation  Date/Time:  Thursday April 01 2021 14:59:09 EDT Ventricular Rate:  91 PR Interval:  122 QRS Duration: 82 QT Interval:  348 QTC Calculation: 429 R Axis:   28 Text Interpretation: Sinus rhythm since last tracing no significant change Confirmed by Malvin Johns [phone]) on 04/01/2021 3:10:07 PM  Radiology No results found.  Procedures .Critical Care  Date/Time: 04/01/2021 3:34 PM Performed by: Alfredia Client, PA-C Authorized by: Alfredia Client,  PA-C   Critical care provider statement:    Critical care time (minutes):  45   Critical care was time spent personally by me on the following activities:  Discussions with consultants, evaluation of patient's response to treatment, examination of patient, ordering and performing treatments and interventions, ordering and review of laboratory studies, ordering and review of radiographic studies, pulse oximetry, re-evaluation of patient's condition, obtaining history from patient or surrogate and review of old charts   Medications Ordered in ED Medications  sodium chloride 0.9 % bolus 1,000 mL (1,000 mLs Intravenous New Bag/Given 04/01/21 1449)  fentaNYL (SUBLIMAZE) injection 50 mcg (50 mcg Intravenous Given 04/01/21 1447)    ED Course  I have reviewed the triage vital signs and the nursing notes.  Pertinent labs & imaging results that were available during my care of the patient were reviewed by me and considered in my medical decision making (see chart for details).    MDM Rules/Calculators/A&P                           Patient presents emerged department today for vaginal bleeding, has required multiple transfusions in the past.  Fortunately patient is not tachycardic here and blood pressure is normotensive, will obtain basic labs and do pelvic at this time and evaluate.  Patient is stable.  Pelvic exam with multiple blood clots.  Unable to perform complete pelvic due to patient's pain, will give pain medication, IV fluids and await CBC.  Patient will need to be consulted GYN.  We will also obtain ultrasound at this time, patient did refuse this last time, however patient is willing to get this at this time.  Pt care was handed off to L. Joldersma PA-C at 330.  Complete history and physical and current plan have been communicated.  Please refer to their note for the remainder of ED care and ultimate disposition.  Awaiting CBC.  Patient will need to be consulted to GYN after this results.  Upon  reevaluation patient still stable.  This chart was discussed with Dr. Tamera Punt who agreed with the care and disposition of the patient.    Final Clinical Impression(s) / ED Diagnoses Final diagnoses:  Vaginal bleeding    Rx / DC Orders ED Discharge Orders     None         Alfredia Client, PA-C 04/01/21 1534    Malvin Johns, MD 04/03/21 [phone]

## 2021-04-01 NOTE — ED Provider Notes (Signed)
Patient is a 45 year old female whose care was transferred to me at shift change from [facility].  Her HPI is below:  Valerie Howard is a 45 y.o. female with past medical history of schizoaffective disorder, abnormal uterine bleeding, bleeding fibroids with multiple past ER visits due to her vaginal bleeding.  Unfortunately patient has had multiple psychosocial obstacles preventing her from follow-up from gynecology, was lost to follow-up.  Per chart review patient has had to have multiple transfusions in the ER, hemoglobin has been 2.9 in the past.  States that vaginal bleeding started yesterday, is worsening today.  States that she is bleeding through pads every hour.  Patient states that she did not take any other medications that were prescribed to her last time she was here.  Is not on iron.  Patient appears extremely sleepy on exam, also admits to some abdominal pain in her lower abdomen.  States this is normal for her when she this type of bleeding. Denies any vaginal discharge. NO CP or SOB.  No dizziness.  No other complaints at this time.  Patient states that her last menstrual cycle was 2 weeks ago, however has been occurring every 2 weeks. Physical Exam  BP 129/88 (BP Location: Left Arm)   Pulse 88   Temp 98.8 F (37.1 C) (Oral)   Resp 18   SpO2 100%   Physical Exam Constitutional:      General: She is not in acute distress.    Appearance: Normal appearance. She is not ill-appearing, toxic-appearing or diaphoretic.    Comments: Patient is extremely sleepy, will wake up for exam and respond appropriately.  HENT:    Mouth/Throat:    Mouth: Mucous membranes are moist.    Pharynx: Oropharynx is clear. Eyes:    General: No scleral icterus.    Extraocular Movements: Extraocular movements intact.    Pupils: Pupils are equal, round, and reactive to light. Cardiovascular:    Rate and Rhythm: Normal rate and regular rhythm.    Pulses: Normal pulses.    Heart sounds: Normal heart  sounds. Pulmonary:    Effort: Pulmonary effort is normal. No respiratory distress.    Breath sounds: Normal breath sounds. No stridor. No wheezing, rhonchi or rales. Chest:    Chest wall: No tenderness. Abdominal:    General: Abdomen is flat. There is no distension.    Palpations: Abdomen is soft.    Tenderness: There is no abdominal tenderness. There is no guarding or rebound. Genitourinary:    Comments: Chaperone present.  Vaginal exam with multiple large bright red blood clots coming from cervix.  Unable to complete pelvic exam due to patient's discomfort.  Cervix is closed. Musculoskeletal:        General: No swelling or tenderness. Normal range of motion.    Cervical back: Normal range of motion and neck supple. No rigidity.    Right lower leg: No edema.    Left lower leg: No edema. Skin:    General: Skin is warm and dry.    Capillary Refill: Capillary refill takes less than 2 seconds.    Coloration: Skin is not pale. Neurological:    General: No focal deficit present.    Mental Status: She is alert and oriented to person, place, and time. Psychiatric:        Mood and Affect: Mood normal.        Behavior: Behavior normal. ED Course/Procedures   Clinical Course as of 04/01/21 1606  Thu Apr 01, 2021  1605  Patient discussed with Dr. Arlina Robes.  Recommends medicine admission for transfusion.  Also recommends starting patient on 40 mg of Megace twice per day and keeping her on this medication chronically.  He states that he will set up an outpatient appointment for her after she is discharged. [LJ]    Clinical Course User Index [LJ] Valerie Sexton, PA-C    Procedures  MDM  Patient is a 45 year old female whose care was transferred to me by Tacy Dura at shift change.  Patient pending lab work at shift change.  In summary, patient has a history of abnormal uterine bleeding.  She has required estrogen as well as blood transfusions for this in the past.  She started having  bleeding yesterday that has been progressively worsening.  She has not been tachycardic and her BP is within normal limits.  Pelvic exam showed multiple blood clots.  Patient discussed with OB/GYN who recommends patient restart Megace 40 mg twice per day and stay on this medication chronically.  Recommends medicine admission for blood transfusion.  He states that he will set up an outpatient appointment for the patient after she is discharged.  Discussed admission with the patient and she is amenable.  We will discuss with the medicine team.       Valerie Sexton, PA-C 04/01/21 1608    Valerie Dessert, MD 04/01/21 2321

## 2021-04-01 NOTE — ED Triage Notes (Signed)
BIBA Per EMS: Pt arrives with complaints of abd pain and vaginal bleeding that began today. Pt states hx of fibroids and surgery for same. All vitals WDL

## 2021-04-02 DIAGNOSIS — D62 Acute posthemorrhagic anemia: Secondary | ICD-10-CM | POA: Diagnosis not present

## 2021-04-02 LAB — CBC
HCT: 30 % — ABNORMAL LOW (ref 36.0–46.0)
Hemoglobin: 8.7 g/dL — ABNORMAL LOW (ref 12.0–15.0)
MCH: 21.4 pg — ABNORMAL LOW (ref 26.0–34.0)
MCHC: 29 g/dL — ABNORMAL LOW (ref 30.0–36.0)
MCV: 73.9 fL — ABNORMAL LOW (ref 80.0–100.0)
Platelets: 236 10*3/uL (ref 150–400)
RBC: 4.06 MIL/uL (ref 3.87–5.11)
RDW: 28.3 % — ABNORMAL HIGH (ref 11.5–15.5)
WBC: 6 10*3/uL (ref 4.0–10.5)
nRBC: 0 % (ref 0.0–0.2)

## 2021-04-02 LAB — URINALYSIS, ROUTINE W REFLEX MICROSCOPIC
Bacteria, UA: NONE SEEN
Bilirubin Urine: NEGATIVE
Glucose, UA: NEGATIVE mg/dL
Ketones, ur: NEGATIVE mg/dL
Leukocytes,Ua: NEGATIVE
Nitrite: NEGATIVE
Protein, ur: 30 mg/dL — AB
RBC / HPF: >50 RBC/hpf — ABNORMAL HIGH (ref 0–5)
Specific Gravity, Urine: 1.014 (ref 1.005–1.030)
pH: 6 (ref 5.0–8.0)

## 2021-04-02 LAB — COMPREHENSIVE METABOLIC PANEL
ALT: 7 U/L (ref 0–44)
AST: 10 U/L — ABNORMAL LOW (ref 15–41)
Albumin: 3.9 g/dL (ref 3.5–5.0)
Alkaline Phosphatase: 50 U/L (ref 38–126)
Anion gap: 9 (ref 5–15)
BUN: 13 mg/dL (ref 6–20)
CO2: 21 mmol/L — ABNORMAL LOW (ref 22–32)
Calcium: 9.3 mg/dL (ref 8.9–10.3)
Chloride: 109 mmol/L (ref 98–111)
Creatinine, Ser: 0.77 mg/dL (ref 0.44–1.00)
GFR, Estimated: >60 mL/min (ref >60)
Glucose, Bld: 84 mg/dL (ref 70–99)
Potassium: 3.8 mmol/L (ref 3.5–5.1)
Sodium: 139 mmol/L (ref 135–145)
Total Bilirubin: 0.9 mg/dL (ref 0.3–1.2)
Total Protein: 7.3 g/dL (ref 6.5–8.1)

## 2021-04-02 LAB — HIV ANTIBODY (ROUTINE TESTING W REFLEX): HIV Screen 4th Generation wRfx: NONREACTIVE

## 2021-04-02 MED ORDER — FERROUS SULFATE 325 (65 FE) MG PO TABS: 325.0000 mg | ORAL_TABLET | Freq: Every day | ORAL | 2 refills | Status: DC

## 2021-04-02 MED ORDER — MEGESTROL ACETATE 40 MG PO TABS: 40.0000 mg | ORAL_TABLET | Freq: Two times a day (BID) | ORAL | 2 refills | Status: AC

## 2021-04-02 NOTE — Progress Notes (Signed)
Discharge instructions given. Patient verbalized understanding and all questions were answered.  ?

## 2021-04-02 NOTE — Discharge Summary (Signed)
Physician Discharge Summary  Valerie Howard [id number] DOB: 08/01/1976 DOA: 04/01/2021  PCP: Patient, No Pcp Per (Inactive)  Admit date: 04/01/2021 Discharge date: 04/02/2021    Admitted From: Home Disposition: Home  Recommendations for Outpatient Follow-up:  Follow up with PCP in 1-2 weeks Follow-up with gynecologist as soon as possible Follow-up with psychiatrist for the treatment of his schizophrenia and paranoid disorder Please obtain BMP/CBC in one week Please follow up with your PCP on the following pending results: Unresulted Labs (From admission, onward)     Start     Ordered   04/01/21 1635  HIV Antibody (routine testing w rflx)  (HIV Antibody (Routine testing w reflex) panel)  Once,   STAT        04/01/21 1635   04/01/21 1414  Wet prep, genital  (STI Panel)  Once,   STAT        04/01/21 [place]: None Equipment/Devices: None  Discharge Condition: Stable CODE STATUS: Full code Diet recommendation: Regular  Subjective: Seen and examined.  Patient has schizophrenia and paranoid disorder and she has been observed to have paranoid behavior since yesterday.  When I entered the room today, she said " what you need".  Although she knew that I was her doctor for the day.  She did not want to answer any question and right away said " I am ready to get out of here" .  She did not want to have any conversation with me.  Looks comfortable.  HPI: Valerie Howard is a 45 y.o. female with medical history significant of schizophrenia, paranoid behavior, metromenorrhagia and uterine fibroids presented to ED with persistent vaginal bleeding.  According to patient, she was following with gynecologist up until few months ago and was taking Megace as well but then she lost to follow-up due to some psychiatric issues and stopped taking medications as well since she did not have any refills.  She tells me that she has periods about twice a month and every time the last  about 7 to 10 days and often days, she has to change pads every 1-2 hours.  She has not been taking her iron pills.  She finally decided to come to the emergency department.  She denies any other complaints such as chest pain, shortness of breath, fever, chills, abdominal pain or any other complaint.  She does have some weakness and exertional shortness of breath.   ED Course: Upon arrival to ED, she was hemodynamically stable.  Her hemoglobin was 4.8.  ED physician discussed case with on-call gynecologist who recommended resuming Megace at 40 mg p.o. twice daily and transfusing and admitting under medicine service and they will follow-up with her as outpatient.  COVID test is pending.    Brief/Interim Summary: Patient was admitted with acute blood loss anemia/symptomatic anemia secondary to menorrhagia due to known uterine fibroids.  She was transfused 3 units of PRBC.  Her posttransfusion hemoglobin is 8.4.  She is doing fine and she is being discharged.  Discharge Diagnoses:  Active Problems:   Anemia, iron deficiency   Personality disorder (HCC)   Schizoaffective disorder, bipolar type ([place])   Menorrhagia with irregular cycle   Acute blood loss anemia   Paranoia (psychosis) (HCC)   Symptomatic anemia    Discharge Instructions   Allergies as of 04/02/2021       Reactions   Penicillins Hives, Itching   Has patient  had a PCN reaction causing immediate rash, facial/tongue/throat swelling, SOB or lightheadedness with hypotension:Patient refuses to answer (PRA) Has patient had a PCN reaction causing severe rash involving mucus membranes or skin necrosis:PRA Has patient had a PCN reaction that required hospitalization PRA Has patient had a PCN reaction occurring within the last 10 years:PRA If all of the above answers are "NO", then may proceed with Cephalosporin use.        Medication List     STOP taking these medications    polyethylene glycol 17 g packet Commonly known as:  MIRALAX / GLYCOLAX   senna-docusate 8.6-50 MG tablet Commonly known as: Senokot-S   tranexamic acid 650 MG Tabs tablet Commonly known as: LYSTEDA       TAKE these medications    ferrous sulfate 325 (65 FE) MG tablet Take 1 tablet (325 mg total) by mouth daily.   megestrol 40 MG tablet Commonly known as: MEGACE Take 1 tablet (40 mg total) by mouth 2 (two) times daily. What changed:  how much to take how to take this when to take this additional instructions        Follow-up Information     [facility] DEPT. Go to .   Specialty: Emergency Medicine Why: If symptoms worsen Contact information: [place] [id number] [place] [id number] [phone]               Allergies  Allergen Reactions   Penicillins Hives and Itching    Has patient had a PCN reaction causing immediate rash, facial/tongue/throat swelling, SOB or lightheadedness with hypotension:Patient refuses to answer (PRA) Has patient had a PCN reaction causing severe rash involving mucus membranes or skin necrosis:PRA Has patient had a PCN reaction that required hospitalization PRA Has patient had a PCN reaction occurring within the last 10 years:PRA If all of the above answers are "NO", then may proceed with Cephalosporin use.     Consultations: ED physician discussed case with on-call gynecologist   Procedures/Studies: No results found.   Discharge Exam: Vitals:   04/02/21 0426 04/02/21 0718  BP: 112/66 129/73  Pulse: 71 83  Resp: 16 19  Temp: 99.1 F (37.3 C) 98.7 F (37.1 C)  SpO2: 99% 100%   Vitals:   04/02/21 0202 04/02/21 0351 04/02/21 0426 04/02/21 0718  BP: 122/81 120/69 112/66 129/73  Pulse: 89 65 71 83  Resp: '17 17 16 19  '$ Temp: 98.6 F (37 C) 98.2 F (36.8 C) 99.1 F (37.3 C) 98.7 F (37.1 C)  TempSrc: Oral Oral Oral Oral  SpO2: 99% 100% 99% 100%  Weight:      Height:        General: Pt is alert, awake,  not in acute distress Cardiovascular: RRR, S1/S2 +, no rubs, no gallops Respiratory: CTA bilaterally, no wheezing, no rhonchi Abdominal: Soft, NT, ND, bowel sounds + Extremities: no edema, no cyanosis    The results of significant diagnostics from this hospitalization (including imaging, microbiology, ancillary and laboratory) are listed below for reference.     Microbiology: Recent Results (from the past 240 hour(s))  Resp Panel by RT-PCR (Flu A&B, Covid) Nasopharyngeal Swab     Status: None   Collection Time: 04/01/21  4:07 PM   Specimen: Nasopharyngeal Swab; Nasopharyngeal(NP) swabs in vial transport medium  Result Value Ref Range Status   SARS Coronavirus 2 by RT PCR NEGATIVE NEGATIVE Final    Comment: (NOTE) SARS-CoV-2 target nucleic acids are NOT DETECTED.  The SARS-CoV-2 RNA  is generally detectable in upper respiratory specimens during the acute phase of infection. The lowest concentration of SARS-CoV-2 viral copies this assay can detect is 138 copies/mL. A negative result does not preclude SARS-Cov-2 infection and should not be used as the sole basis for treatment or other patient management decisions. A negative result may occur with  improper specimen collection/handling, submission of specimen other than nasopharyngeal swab, presence of viral mutation(s) within the areas targeted by this assay, and inadequate number of viral copies(<138 copies/mL). A negative result must be combined with clinical observations, patient history, and epidemiological information. The expected result is Negative.  Fact Sheet for Patients:  EntrepreneurPulse.com.au  Fact Sheet for Healthcare Providers:  IncredibleEmployment.be  This test is no t yet approved or cleared by the [place] FDA and  has been authorized for detection and/or diagnosis of SARS-CoV-2 by FDA under an Emergency Use Authorization (EUA). This EUA will remain  in effect  (meaning this test can be used) for the duration of the COVID-19 declaration under Section 564(b)(1) of the Act, 21 U.S.C.section 360bbb-3(b)(1), unless the authorization is terminated  or revoked sooner.       Influenza A by PCR NEGATIVE NEGATIVE Final   Influenza B by PCR NEGATIVE NEGATIVE Final    Comment: (NOTE) The Xpert Xpress SARS-CoV-2/FLU/RSV plus assay is intended as an aid in the diagnosis of influenza from Nasopharyngeal swab specimens and should not be used as a sole basis for treatment. Nasal washings and aspirates are unacceptable for Xpert Xpress SARS-CoV-2/FLU/RSV testing.  Fact Sheet for Patients: EntrepreneurPulse.com.au  Fact Sheet for Healthcare Providers: IncredibleEmployment.be  This test is not yet approved or cleared by the [place] FDA and has been authorized for detection and/or diagnosis of SARS-CoV-2 by FDA under an Emergency Use Authorization (EUA). This EUA will remain in effect (meaning this test can be used) for the duration of the COVID-19 declaration under Section 564(b)(1) of the Act, 21 U.S.C. section 360bbb-3(b)(1), unless the authorization is terminated or revoked.  Performed at [facility], [place] [address] [place],  [address]      Labs: BNP (last 3 results) No results for input(s): BNP in the last 8760 hours. Basic Metabolic Panel: Recent Labs  Lab 04/01/21 1359 04/02/21 0816  NA 140 139  K 4.2 3.8  CL 110 109  CO2 24 21*  GLUCOSE 103* 84  BUN 13 13  CREATININE 0.87 0.77  CALCIUM 9.3 9.3  MG 2.2  --    Liver Function Tests: Recent Labs  Lab 04/01/21 1359 04/02/21 0816  AST 11* 10*  ALT 7 7  ALKPHOS 50 50  BILITOT 0.6 0.9  PROT 7.2 7.3  ALBUMIN 3.7 3.9   Recent Labs  Lab 04/01/21 1359  LIPASE 23   No results for input(s): AMMONIA in the last 168 hours. CBC: Recent Labs  Lab 04/01/21 1359 04/02/21 0816  WBC 5.3 6.0  NEUTROABS 3.2  --    HGB 4.8* 8.7*  HCT 19.0* 30.0*  MCV 66.0* 73.9*  PLT 273 236   Cardiac Enzymes: No results for input(s): CKTOTAL, CKMB, CKMBINDEX, TROPONINI in the last 168 hours. BNP: Invalid input(s): POCBNP CBG: No results for input(s): GLUCAP in the last 168 hours. D-Dimer No results for input(s): DDIMER in the last 72 hours. Hgb A1c No results for input(s): HGBA1C in the last 72 hours. Lipid Profile No results for input(s): CHOL, HDL, LDLCALC, TRIG, CHOLHDL, LDLDIRECT in the last 72 hours. Thyroid function studies Recent Labs  04/01/21 1535  TSH 0.689   Anemia work up Recent Labs    04/01/21 1535  FERRITIN 1*  TIBC 412  IRON 10*  RETICCTPCT 0.8   Urinalysis    Component Value Date/Time   COLORURINE YELLOW 04/02/2021 0200   APPEARANCEUR CLOUDY (A) 04/02/2021 0200   LABSPEC 1.014 04/02/2021 0200   PHURINE 6.0 04/02/2021 0200   GLUCOSEU NEGATIVE 04/02/2021 0200   HGBUR MODERATE (A) 04/02/2021 0200   BILIRUBINUR NEGATIVE 04/02/2021 0200   KETONESUR NEGATIVE 04/02/2021 0200   PROTEINUR 30 (A) 04/02/2021 0200   UROBILINOGEN 0.2 12/06/2011 0034   NITRITE NEGATIVE 04/02/2021 0200   LEUKOCYTESUR NEGATIVE 04/02/2021 0200   Sepsis Labs Invalid input(s): PROCALCITONIN,  WBC,  LACTICIDVEN Microbiology Recent Results (from the past 240 hour(s))  Resp Panel by RT-PCR (Flu A&B, Covid) Nasopharyngeal Swab     Status: None   Collection Time: 04/01/21  4:07 PM   Specimen: Nasopharyngeal Swab; Nasopharyngeal(NP) swabs in vial transport medium  Result Value Ref Range Status   SARS Coronavirus 2 by RT PCR NEGATIVE NEGATIVE Final    Comment: (NOTE) SARS-CoV-2 target nucleic acids are NOT DETECTED.  The SARS-CoV-2 RNA is generally detectable in upper respiratory specimens during the acute phase of infection. The lowest concentration of SARS-CoV-2 viral copies this assay can detect is 138 copies/mL. A negative result does not preclude SARS-Cov-2 infection and should not be used as the  sole basis for treatment or other patient management decisions. A negative result may occur with  improper specimen collection/handling, submission of specimen other than nasopharyngeal swab, presence of viral mutation(s) within the areas targeted by this assay, and inadequate number of viral copies(<138 copies/mL). A negative result must be combined with clinical observations, patient history, and epidemiological information. The expected result is Negative.  Fact Sheet for Patients:  EntrepreneurPulse.com.au  Fact Sheet for Healthcare Providers:  IncredibleEmployment.be  This test is no t yet approved or cleared by the [place] FDA and  has been authorized for detection and/or diagnosis of SARS-CoV-2 by FDA under an Emergency Use Authorization (EUA). This EUA will remain  in effect (meaning this test can be used) for the duration of the COVID-19 declaration under Section 564(b)(1) of the Act, 21 U.S.C.section 360bbb-3(b)(1), unless the authorization is terminated  or revoked sooner.       Influenza A by PCR NEGATIVE NEGATIVE Final   Influenza B by PCR NEGATIVE NEGATIVE Final    Comment: (NOTE) The Xpert Xpress SARS-CoV-2/FLU/RSV plus assay is intended as an aid in the diagnosis of influenza from Nasopharyngeal swab specimens and should not be used as a sole basis for treatment. Nasal washings and aspirates are unacceptable for Xpert Xpress SARS-CoV-2/FLU/RSV testing.  Fact Sheet for Patients: EntrepreneurPulse.com.au  Fact Sheet for Healthcare Providers: IncredibleEmployment.be  This test is not yet approved or cleared by the [place] FDA and has been authorized for detection and/or diagnosis of SARS-CoV-2 by FDA under an Emergency Use Authorization (EUA). This EUA will remain in effect (meaning this test can be used) for the duration of the COVID-19 declaration under Section 564(b)(1) of the  Act, 21 U.S.C. section 360bbb-3(b)(1), unless the authorization is terminated or revoked.  Performed at [facility], [place] [address], [place], [place] [address]      Time coordinating discharge: Over 30 minutes  SIGNED:   Darliss Cheney, MD  [facility] 04/02/2021, 9:26 AM  If 7PM-7AM, please contact night-coverage [email or web address]

## 2021-04-03 LAB — BPAM RBC
Blood Product Expiration Date: 202209082359
Blood Product Expiration Date: 202209102359
Blood Product Expiration Date: 202209122359
ISSUE DATE / TIME: 202208042310
ISSUE DATE / TIME: 202208050143
ISSUE DATE / TIME: 202208050406
Unit Type and Rh: 5100
Unit Type and Rh: 5100
Unit Type and Rh: 5100

## 2021-04-03 LAB — TYPE AND SCREEN
ABO/RH(D): O POS
Antibody Screen: NEGATIVE
Donor AG Type: NEGATIVE
Donor AG Type: NEGATIVE
Donor AG Type: NEGATIVE
Unit division: 0
Unit division: 0
Unit division: 0

## 2021-06-26 IMAGING — DX DG CHEST 2V
2 series · 2 of 2 positions shown · non-contrast
Comparison: 03/12/2020.

CLINICAL DATA: Chest pain

EXAM:
CHEST - 2 VIEW

[w chest pa]
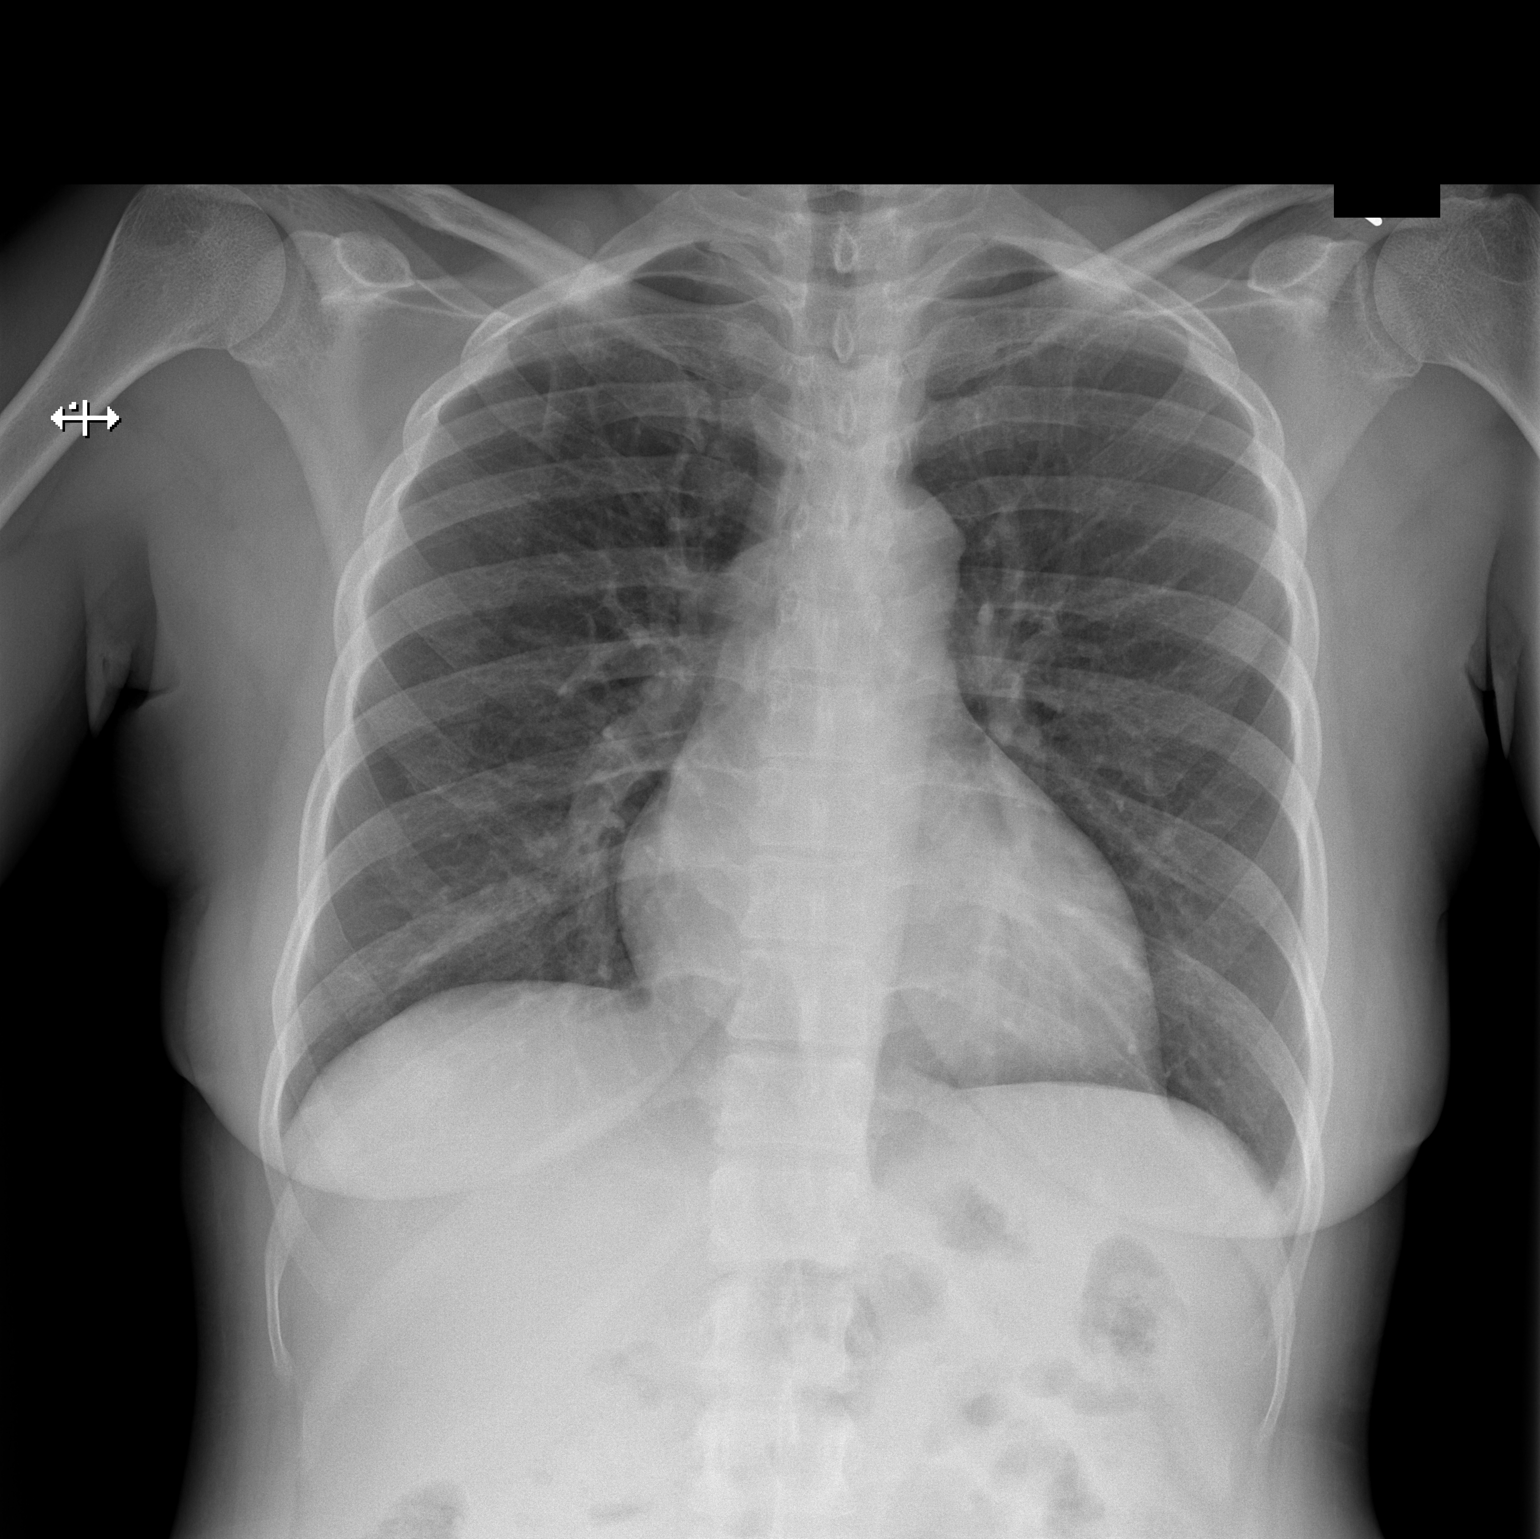

[w chest lat]
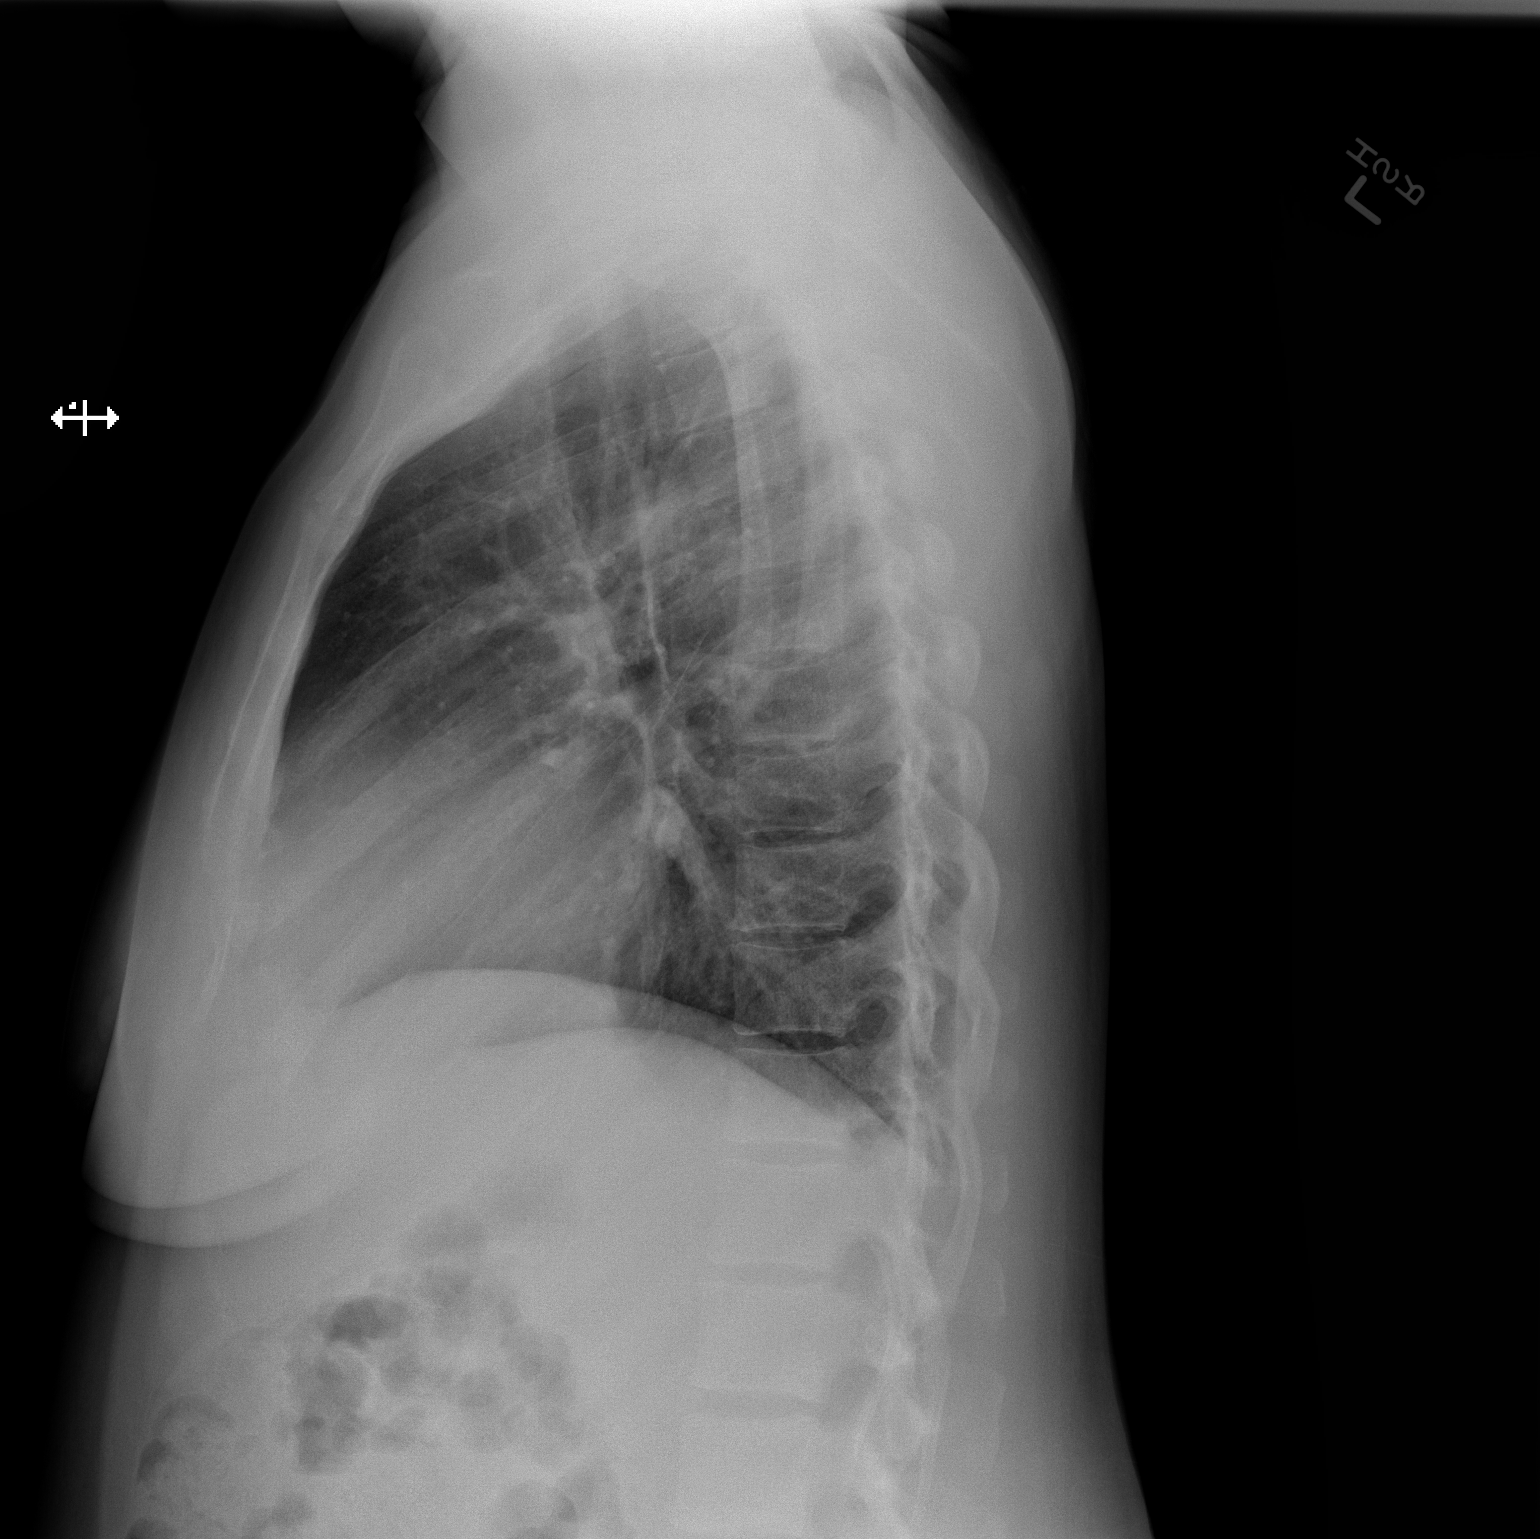

[2 of 2 positions shown; findings below may reference images not displayed]

FINDINGS: The heart size and mediastinal contours are within normal limits.
Both lungs are clear. No pleural effusion or pneumothorax. The
visualized skeletal structures are unremarkable.
IMPRESSION: No acute process in the chest.

## 2021-08-31 IMAGING — DX DG CHEST 1V PORT
1 series · 1 of 1 positions shown · non-contrast
Comparison: 03/18/2020

CLINICAL DATA: Shortness of breath.

EXAM:
PORTABLE CHEST 1 VIEW

[chest]
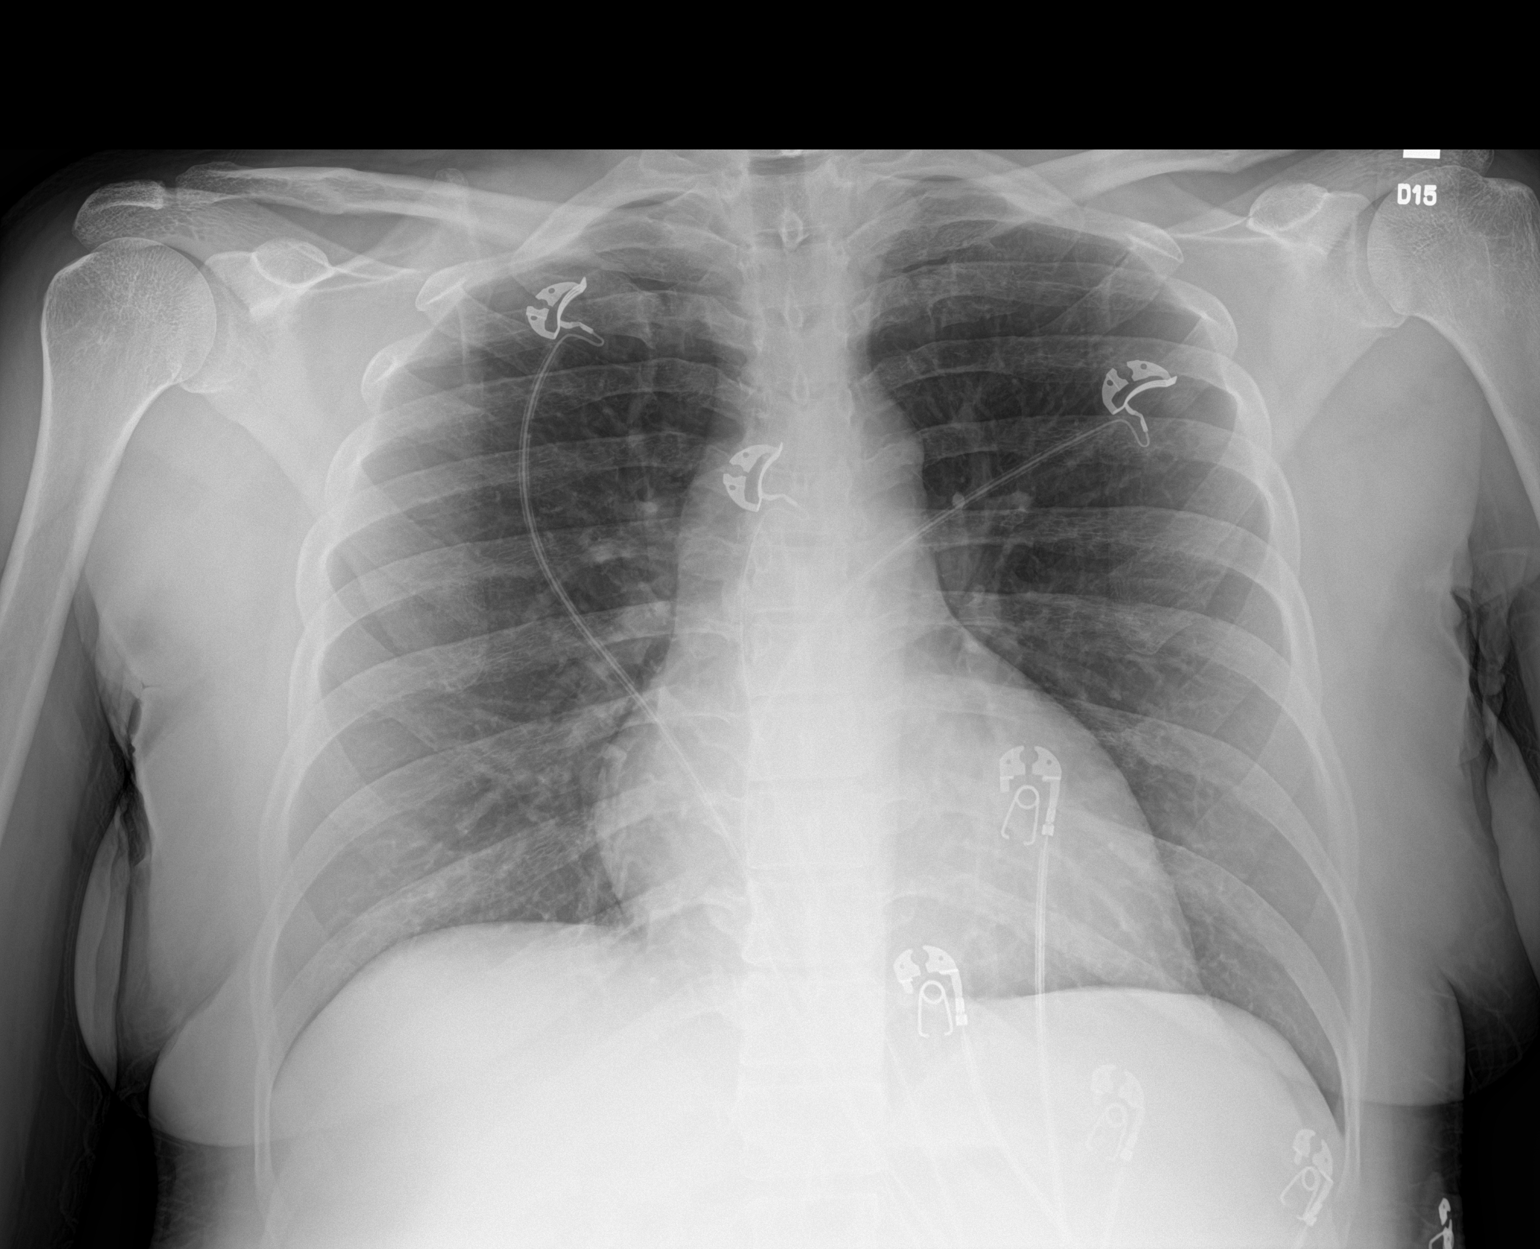

[1 of 1 positions shown; findings below may reference images not displayed]

FINDINGS: The cardiomediastinal contours are normal. The lungs are clear.
Pulmonary vasculature is normal. No consolidation, pleural effusion,
or pneumothorax. No acute osseous abnormalities are seen.
IMPRESSION: Negative AP view of the chest.

## 2021-09-02 ENCOUNTER — Encounter (HOSPITAL_COMMUNITY): Payer: Self-pay

## 2021-09-02 ENCOUNTER — Emergency Department (HOSPITAL_COMMUNITY)
Admission: EM | Admit: 2021-09-02 | Discharge: 2021-09-03 | Disposition: A | Discharge status: Home / Self Care | Payer: Medicaid Other | Attending: Emergency Medicine | Admitting: Emergency Medicine

## 2021-09-02 ENCOUNTER — Other Ambulatory Visit: Payer: Self-pay

## 2021-09-02 DIAGNOSIS — D649 Anemia, unspecified: Secondary | ICD-10-CM | POA: Diagnosis not present

## 2021-09-02 DIAGNOSIS — N898 Other specified noninflammatory disorders of vagina: Secondary | ICD-10-CM | POA: Diagnosis present

## 2021-09-02 LAB — CBC WITH DIFFERENTIAL/PLATELET
Abs Immature Granulocytes: 0.01 10*3/uL (ref 0.00–0.07)
Basophils Absolute: 0 10*3/uL (ref 0.0–0.1)
Basophils Relative: 0 %
Eosinophils Absolute: 0 10*3/uL (ref 0.0–0.5)
Eosinophils Relative: 0 %
HCT: 23.4 % — ABNORMAL LOW (ref 36.0–46.0)
Hemoglobin: 6.1 g/dL — CL (ref 12.0–15.0)
Immature Granulocytes: 0 %
Lymphocytes Relative: 42 %
Lymphs Abs: 2 10*3/uL (ref 0.7–4.0)
MCH: 17.1 pg — ABNORMAL LOW (ref 26.0–34.0)
MCHC: 26.1 g/dL — ABNORMAL LOW (ref 30.0–36.0)
MCV: 65.7 fL — ABNORMAL LOW (ref 80.0–100.0)
Monocytes Absolute: 0.3 10*3/uL (ref 0.1–1.0)
Monocytes Relative: 7 %
Neutro Abs: 2.5 10*3/uL (ref 1.7–7.7)
Neutrophils Relative %: 51 %
Platelets: 521 10*3/uL — ABNORMAL HIGH (ref 150–400)
RBC: 3.56 MIL/uL — ABNORMAL LOW (ref 3.87–5.11)
RDW: 22 % — ABNORMAL HIGH (ref 11.5–15.5)
WBC: 4.9 10*3/uL (ref 4.0–10.5)
nRBC: 0.4 % — ABNORMAL HIGH (ref 0.0–0.2)

## 2021-09-02 LAB — I-STAT BETA HCG BLOOD, ED (MC, WL, AP ONLY): I-stat hCG, quantitative: <5 m[IU]/mL (ref <5)

## 2021-09-02 LAB — BASIC METABOLIC PANEL
Anion gap: 6 (ref 5–15)
BUN: 7 mg/dL (ref 6–20)
CO2: 26 mmol/L (ref 22–32)
Calcium: 9.1 mg/dL (ref 8.9–10.3)
Chloride: 104 mmol/L (ref 98–111)
Creatinine, Ser: 0.92 mg/dL (ref 0.44–1.00)
GFR, Estimated: >60 mL/min (ref >60)
Glucose, Bld: 90 mg/dL (ref 70–99)
Potassium: 3.9 mmol/L (ref 3.5–5.1)
Sodium: 136 mmol/L (ref 135–145)

## 2021-09-02 LAB — URINALYSIS, ROUTINE W REFLEX MICROSCOPIC
Bilirubin Urine: NEGATIVE
Glucose, UA: NEGATIVE mg/dL
Hgb urine dipstick: NEGATIVE
Ketones, ur: NEGATIVE mg/dL
Leukocytes,Ua: NEGATIVE
Nitrite: NEGATIVE
Protein, ur: NEGATIVE mg/dL
Specific Gravity, Urine: 1.02 (ref 1.005–1.030)
pH: 5 (ref 5.0–8.0)

## 2021-09-02 MED ORDER — SODIUM CHLORIDE 0.9 % IV SOLN
10.0000 mL/h | Freq: Once | INTRAVENOUS | Status: AC
  Administered 2021-09-03: 10 mL/h via INTRAVENOUS

## 2021-09-02 NOTE — ED Provider Triage Note (Signed)
Emergency Medicine Provider Triage Evaluation Note  Valerie Howard , a 46 y.o. female  was evaluated in triage.  Pt complains of vaginal discharge.  Patient states that the past "few days" she has had discharge from her vagina.  She is unable to elaborate or describe the discharge.  She states that she feels it "coming out" when she urinates.  She states that it is not her urine though.  She states that she has not wiped any discharge and cannot describe color, consistency.  She denies any odor.  She denies hematuria, dysuria, abdominal pain, fevers.  She states last menstrual period was a "few days ago" she states last intercourse was "a while ago"  Review of Systems  Positive: See above Negative:   Physical Exam  BP (!) 146/102 (BP Location: Right Arm)    Pulse 94    Temp 99.4 F (37.4 C) (Oral)    Resp 16    Ht 5\' 7"  (1.702 m)    Wt 81.6 kg    SpO2 99%    BMI 28.19 kg/m  Gen:   Awake, no distress   Resp:  Normal effort  MSK:   Moves extremities without difficulty  Other:  GU exam deferred in triage  Medical Decision Making  Medically screening exam initiated at 4:18 PM.  Appropriate orders placed.  Yosselin L Shindler was informed that the remainder of the evaluation will be completed by another provider, this initial triage assessment does not replace that evaluation, and the importance of remaining in the ED until their evaluation is complete.     Mickie Hillier, PA-C 09/02/21 [phone]

## 2021-09-02 NOTE — ED Notes (Signed)
Pt asked for chux pad, and refused a sanitary pad.

## 2021-09-02 NOTE — ED Triage Notes (Signed)
Pt BIB [facility] c/o vaginal discharge that is green and yellow. Pt states it has been going on for awhile.

## 2021-09-02 NOTE — ED Provider Notes (Addendum)
[facility] EMERGENCY DEPARTMENT Provider Note   CSN: [id number] Arrival date & time: 09/02/21  1525     History  Chief Complaint  Patient presents with   Vaginal Discharge    Valerie Howard is a 46 y.o. female.  Patient is a 46 yo female presenting for vaginal and rectal leaking. Patient states "whenever my menstrual cycle is going on I have leakage of foods. I pee and urinate and can't see anything but I can feel the leaking of fluids".  Admits to foul smell. Admits to yellow-green discharge from rectum and vagina. Denies vaginal bleeding. Denies concern for STI's. Denies fever, chills, nausea, or vomiting. Admits to lower abdominal pain. Admits to burning with urinary, increased frequency, or urgency.  The history is provided by the patient. No language interpreter was used.  Vaginal Discharge Associated symptoms: abdominal pain and dysuria   Associated symptoms: no fever and no vomiting       Home Medications Prior to Admission medications   Medication Sig Start Date End Date Taking? Authorizing Provider  ferrous sulfate 325 (65 FE) MG tablet Take 1 tablet (325 mg total) by mouth daily. 04/02/21 07/01/21  Darliss Cheney, MD      Allergies    Penicillins    Review of Systems   Review of Systems  Constitutional:  Negative for chills and fever.  HENT:  Negative for ear pain and sore throat.   Eyes:  Negative for pain and visual disturbance.  Respiratory:  Negative for cough and shortness of breath.   Cardiovascular:  Negative for chest pain and palpitations.  Gastrointestinal:  Positive for abdominal pain. Negative for vomiting.  Genitourinary:  Positive for dysuria, frequency and vaginal discharge. Negative for hematuria.  Musculoskeletal:  Negative for arthralgias and back pain.  Skin:  Negative for color change and rash.  Neurological:  Negative for seizures and syncope.  All other systems reviewed and are negative.  Physical Exam Updated Vital  Signs BP (!) 142/61 (BP Location: Right Arm)    Pulse 85    Temp 99.1 F (37.3 C) (Oral)    Resp 20    Ht 5\' 7"  (1.702 m)    Wt 81.6 kg    SpO2 100%    BMI 28.19 kg/m  Physical Exam Vitals and nursing note reviewed.  Constitutional:      General: She is not in acute distress.    Appearance: She is well-developed.  HENT:     Head: Normocephalic and atraumatic.  Eyes:     Conjunctiva/sclera: Conjunctivae normal.  Cardiovascular:     Rate and Rhythm: Normal rate and regular rhythm.     Heart sounds: No murmur heard. Pulmonary:     Effort: Pulmonary effort is normal. No respiratory distress.     Breath sounds: Normal breath sounds.  Abdominal:     Palpations: Abdomen is soft.     Tenderness: There is no abdominal tenderness.  Musculoskeletal:        General: No swelling.     Cervical back: Neck supple.  Skin:    General: Skin is warm and dry.     Capillary Refill: Capillary refill takes less than 2 seconds.  Neurological:     Mental Status: She is alert.  Psychiatric:        Mood and Affect: Mood normal. Affect is labile and angry.        Speech: Speech is tangential.    ED Results / Procedures / Treatments   Labs (  all labs ordered are listed, but only abnormal results are displayed) Labs Reviewed  URINALYSIS, ROUTINE W REFLEX MICROSCOPIC - Abnormal; Notable for the following components:      Result Value   APPearance HAZY (*)    All other components within normal limits  CBC WITH DIFFERENTIAL/PLATELET - Abnormal; Notable for the following components:   RBC 3.56 (*)    Hemoglobin 6.1 (*)    HCT 23.4 (*)    MCV 65.7 (*)    MCH 17.1 (*)    MCHC 26.1 (*)    RDW 22.0 (*)    Platelets 521 (*)    nRBC 0.4 (*)    All other components within normal limits  BASIC METABOLIC PANEL  I-STAT BETA HCG BLOOD, ED ([facility], [facility], [facility] ONLY)    EKG None  Radiology No results found.  Procedures .Critical Care Performed by: Lianne Cure, DO Authorized by: Lianne Cure, DO    Critical care provider statement:    Critical care time (minutes):  30   Critical care was necessary to treat or prevent imminent or life-threatening deterioration of the following conditions: anemia requiring blood transfusion.   Critical care was time spent personally by me on the following activities:  Development of treatment plan with patient or surrogate, discussions with consultants, evaluation of patient's response to treatment, examination of patient, ordering and review of laboratory studies, ordering and review of radiographic studies, ordering and performing treatments and interventions, pulse oximetry, re-evaluation of patient's condition and review of old charts    Medications Ordered in ED Medications - No data to display  ED Course/ Medical Decision Making/ A&P                           Medical Decision Making  10:19 PM 46 yo female presenting for vaginal and rectal leaking. Patient Aox3, afebrile, stable vitals. Patient's speech is tangential with labile mood.  After discussing HPI patient agrees that she wants a GU exam to evaluate "green-yellow leaking fluid from vagina and bottom". While preparing for GU exam patient becomes agitated stating "I don't want anyone looking at my private parts since it has been looked at before and nothing came of it" I explained to her that it is going to be difficult for me to determine what is going on with her without looking but told her I did not wish to make her feel uncomfortable in any way. Patient requested UA and blood work only stating "I don't want the sheriff involved".  UA demonstrates no hematuria. No UTI. Hemoglobin 6.1. Patient has hx of significant bleeding with menstrual cycles resulting in blood transfusion in the past and has been recommended for hysterectomy. Risks versus benefits of transfusion discussed with patient in detail. 1 u prbc ordered.   Patient has hx of schizoaffective disorder and has been seen previously for  by GYN and recommended for hysterectomy. She has also been evaluated by psychiatry for her presenting symptoms previously. No psychosis. Labile state likely patient's baseline. Currently she is resting comfortably on bed awaiting blood transfusion. Will plan for DC after blood products with continued follow up with GYN.  Patient signed out to oncoming physician.         Final Clinical Impression(s) / ED Diagnoses Final diagnoses:  Anemia, unspecified type    Rx / DC Orders ED Discharge Orders     None         Lianne Cure, DO  33/43/56 [id number]    Lianne Cure, DO 83/72/90 2111    Inola Lisle, [place] P, DO 55/20/80 2354

## 2021-09-03 LAB — PREPARE RBC (CROSSMATCH)

## 2021-09-03 NOTE — Discharge Instructions (Addendum)
It would be beneficial for you to follow-up with gynecology regarding your heavy menstrual cycles as it is causing you to require recurrent blood transfusions for your anemia.

## 2021-09-03 NOTE — ED Notes (Signed)
RN reviewed discharge instructions with pt. Pt verbalized understanding and had no further questions. VSS upon discharge.  

## 2021-09-06 LAB — TYPE AND SCREEN
ABO/RH(D): O POS
Antibody Screen: NEGATIVE
Donor AG Type: NEGATIVE
Donor AG Type: NEGATIVE
Unit division: 0
Unit division: 0

## 2021-09-06 LAB — BPAM RBC
Blood Product Expiration Date: 202301162359
Blood Product Expiration Date: 202302062359
ISSUE DATE / TIME: 202301060118
Unit Type and Rh: 5100
Unit Type and Rh: 9500

## 2021-09-30 ENCOUNTER — Emergency Department (HOSPITAL_COMMUNITY)
Admission: EM | Admit: 2021-09-30 | Discharge: 2021-09-30 | Disposition: A | Discharge status: Home / Self Care | Payer: Medicaid Other | Attending: Emergency Medicine | Admitting: Emergency Medicine

## 2021-09-30 DIAGNOSIS — Z5900 Homelessness unspecified: Secondary | ICD-10-CM | POA: Diagnosis not present

## 2021-09-30 DIAGNOSIS — X398XXA Other exposure to forces of nature, initial encounter: Secondary | ICD-10-CM

## 2021-09-30 DIAGNOSIS — X31XXXA Exposure to excessive natural cold, initial encounter: Secondary | ICD-10-CM | POA: Diagnosis not present

## 2021-09-30 DIAGNOSIS — T698XXA Other specified effects of reduced temperature, initial encounter: Secondary | ICD-10-CM | POA: Insufficient documentation

## 2021-09-30 DIAGNOSIS — L853 Xerosis cutis: Secondary | ICD-10-CM | POA: Diagnosis not present

## 2021-09-30 NOTE — ED Provider Notes (Signed)
[facility] EMERGENCY DEPARTMENT Provider Note   CSN: [id number] Arrival date & time: 09/30/21  0403     History  Chief Complaint  Patient presents with   Cold Exposure   hungry    AKASIA AHMAD is a 46 y.o. female with a past medical history of schizophrenia, personality disorder, menorrhagia.  Presents to the emergency department with a chief complaint of cold exposure.  Per EMS patient called 911 because she was "cold and hungry."  Patient reports that she was waiting outside to be picked up and fell asleep.  Patient reports that her toes are cold.  Patient reports that she is homeless.  Denies any SI, HI, AVH, numbness, weakness, color change, pallor, wound.  HPI     Home Medications Prior to Admission medications   Medication Sig Start Date End Date Taking? Authorizing Provider  ferrous sulfate 325 (65 FE) MG tablet Take 1 tablet (325 mg total) by mouth daily. 04/02/21 07/01/21  Darliss Cheney, MD      Allergies    Penicillins    Review of Systems   Review of Systems  Skin:  Negative for color change, pallor, rash and wound.  Neurological:  Negative for weakness and numbness.  Psychiatric/Behavioral:  Negative for hallucinations, self-injury and suicidal ideas.    Physical Exam Updated Vital Signs BP (!) 159/91 (BP Location: Right Arm)    Pulse 86    Temp 98.2 F (36.8 C) (Oral)    Resp (!) 22    SpO2 100%  Physical Exam Vitals and nursing note reviewed.  Constitutional:      General: She is not in acute distress.    Appearance: She is not ill-appearing, toxic-appearing or diaphoretic.  HENT:     Head: Normocephalic.  Eyes:     General: No scleral icterus.       Right eye: No discharge.        Left eye: No discharge.  Cardiovascular:     Rate and Rhythm: Normal rate.     Pulses:          Posterior tibial pulses are 2+ on the right side and 2+ on the left side.  Pulmonary:     Effort: Pulmonary effort is normal.  Musculoskeletal:      Right ankle: No swelling, deformity, ecchymosis or lacerations. No tenderness. Normal range of motion.     Left ankle: No swelling, deformity, ecchymosis or lacerations. No tenderness. Normal range of motion.     Right foot: Normal range of motion and normal capillary refill. No swelling, deformity, laceration, tenderness, bony tenderness or crepitus. Normal pulse.     Left foot: Normal range of motion and normal capillary refill. No swelling, deformity, laceration, tenderness, bony tenderness or crepitus. Normal pulse.     Comments: Bilateral feet are cool to touch.  +2 DP pulses bilaterally.  Sensation intact to all digits on bilateral feet, full range of motion to all digits on bilateral feet, cap refill less than 2 seconds in all digits on bilateral feet.  No erythema, pallor, or wounds noted to bilateral feet.  Feet:     Right foot:     Skin integrity: Dry skin present. No ulcer, blister, skin breakdown, erythema, warmth, callus or fissure.     Toenail Condition: Right toenails are normal.     Left foot:     Skin integrity: Dry skin present. No ulcer, blister, skin breakdown, erythema, warmth, callus or fissure.     Toenail Condition: Left  toenails are normal.  Skin:    General: Skin is warm and dry.  Neurological:     General: No focal deficit present.     Mental Status: She is alert and oriented to person, place, and time.     GCS: GCS eye subscore is 4. GCS verbal subscore is 5. GCS motor subscore is 6.  Psychiatric:        Attention and Perception: She is attentive. She does not perceive auditory or visual hallucinations.        Behavior: Behavior is cooperative.        Thought Content: Thought content does not include homicidal or suicidal ideation.    ED Results / Procedures / Treatments   Labs (all labs ordered are listed, but only abnormal results are displayed) Labs Reviewed - No data to display  EKG None  Radiology No results found.  Procedures Procedures     Medications Ordered in ED Medications - No data to display  ED Course/ Medical Decision Making/ A&P                           Medical Decision Making  Alert 46 year old female no acute distress, nontoxic-appearing.  Presents to the emergency department with chief complaint of cold exposure.  Patient is alert to person, place, and time.  Denies any SI, HI, AVH.  Patient has past medical history of schizophrenia which complicate her care.  Information was obtained from patient.  Medical records were reviewed including previous provider notes and lab results.  Patient has temperature of 98.2 F orally.  No signs of frostbite to bilateral feet.  We will plan to discharge patient at this time.  Patient given resources for shelters in the area.  Discussed results, findings, treatment and follow up. Patient advised of return precautions. Patient verbalized understanding and agreed with plan.          Final Clinical Impression(s) / ED Diagnoses Final diagnoses:  Exposure to weather condition, initial encounter    Rx / DC Orders ED Discharge Orders     None         Loni Beckwith, PA-C 09/30/21 8891    Maudie Flakes, MD 10/01/21 [phone]

## 2021-09-30 NOTE — Discharge Instructions (Signed)
You came to the emergency department today to be evaluated for your cold toes.  Your physical exam was reassuring.  I have attached paperwork for shelters in the area.  Please return to the emergency department if you develop any new or concerning symptoms.

## 2021-09-30 NOTE — ED Triage Notes (Addendum)
BIBEMS from front of [employer]. Called EMS because she was "cold and hungry". Patient states she was waiting outside to be picked up and fell asleep and now her toes are cold. This RN offered socks and food and warm blanket but patient declined. No other complaints.

## 2021-10-11 ENCOUNTER — Emergency Department (HOSPITAL_COMMUNITY)
Admission: EM | Admit: 2021-10-11 | Discharge: 2021-10-12 | Disposition: A | Discharge status: Home / Self Care | Payer: Medicaid Other | Attending: Emergency Medicine | Admitting: Emergency Medicine

## 2021-10-11 ENCOUNTER — Other Ambulatory Visit: Payer: Self-pay

## 2021-10-11 ENCOUNTER — Encounter (HOSPITAL_COMMUNITY): Payer: Self-pay

## 2021-10-11 DIAGNOSIS — D649 Anemia, unspecified: Secondary | ICD-10-CM | POA: Diagnosis not present

## 2021-10-11 DIAGNOSIS — N938 Other specified abnormal uterine and vaginal bleeding: Secondary | ICD-10-CM | POA: Insufficient documentation

## 2021-10-11 LAB — CBC
HCT: 24.6 % — ABNORMAL LOW (ref 36.0–46.0)
Hemoglobin: 6.5 g/dL — CL (ref 12.0–15.0)
MCH: 17.9 pg — ABNORMAL LOW (ref 26.0–34.0)
MCHC: 26.4 g/dL — ABNORMAL LOW (ref 30.0–36.0)
MCV: 67.8 fL — ABNORMAL LOW (ref 80.0–100.0)
Platelets: 150 10*3/uL (ref 150–400)
RBC: 3.63 MIL/uL — ABNORMAL LOW (ref 3.87–5.11)
RDW: 25.6 % — ABNORMAL HIGH (ref 11.5–15.5)
WBC: 5.7 10*3/uL (ref 4.0–10.5)
nRBC: 0.4 % — ABNORMAL HIGH (ref 0.0–0.2)

## 2021-10-11 LAB — URINALYSIS, ROUTINE W REFLEX MICROSCOPIC
Bacteria, UA: NONE SEEN
Bilirubin Urine: NEGATIVE
Glucose, UA: NEGATIVE mg/dL
Ketones, ur: NEGATIVE mg/dL
Leukocytes,Ua: NEGATIVE
Nitrite: NEGATIVE
Protein, ur: 30 mg/dL — AB
RBC / HPF: >50 RBC/hpf — ABNORMAL HIGH (ref 0–5)
Specific Gravity, Urine: 1.018 (ref 1.005–1.030)
pH: 5 (ref 5.0–8.0)

## 2021-10-11 LAB — PREGNANCY, URINE: Preg Test, Ur: NEGATIVE

## 2021-10-11 LAB — PREPARE RBC (CROSSMATCH)

## 2021-10-11 MED ORDER — MEGESTROL ACETATE 20 MG PO TABS: 20.0000 mg | ORAL_TABLET | Freq: Two times a day (BID) | ORAL | 6 refills | Status: DC

## 2021-10-11 MED ORDER — SODIUM CHLORIDE 0.9% IV SOLUTION: Freq: Once | INTRAVENOUS | Status: AC

## 2021-10-11 MED ORDER — FERROUS SULFATE 325 (65 FE) MG PO TABS: 325.0000 mg | ORAL_TABLET | Freq: Every day | ORAL | 0 refills | Status: DC

## 2021-10-11 NOTE — ED Triage Notes (Signed)
Patient complains of heavy vaginal bleeding that started today with large clots. Patient denis pain, alert and oriented. NAD

## 2021-10-11 NOTE — ED Notes (Signed)
Pt given crackers and soda per request and MD approval.

## 2021-10-11 NOTE — ED Provider Notes (Signed)
[facility] EMERGENCY DEPARTMENT Provider Note   CSN: [id number] Arrival date & time: 10/11/21  1657     History  Chief Complaint  Patient presents with   Vaginal Bleeding    [name] is a 46 y.o. female.  46 yo F with a chief complaint of dysfunctional uterine bleeding.  Patient tells me this problem keeps happening to her.  She is not really sure why.  She unfortunately has a history of schizophrenia and sometimes difficult to direct.  Tells me that she is sure it is coming from her vagina.  Denies any rectal bleeding denies any abdominal discomfort.  Usually improves when she takes Megace and then recurs when she stops taking it.  She is having trouble seeing GYN due to transportation.   Vaginal Bleeding     Home Medications Prior to Admission medications   Medication Sig Start Date End Date Taking? Authorizing Provider  ferrous sulfate 325 (65 FE) MG tablet Take 1 tablet (325 mg total) by mouth daily. 10/11/21  Yes Deno Etienne, DO  megestrol (MEGACE) 20 MG tablet Take 1 tablet (20 mg total) by mouth 2 (two) times daily. 10/11/21 05/09/22 Yes Deno Etienne, DO      Allergies    Penicillins    Review of Systems   Review of Systems  Genitourinary:  Positive for vaginal bleeding.   Physical Exam Updated Vital Signs BP 119/82    Pulse 82    Temp 98.4 F (36.9 C) (Oral)    Resp 16    SpO2 98%  Physical Exam Vitals and nursing note reviewed.  Constitutional:      General: She is not in acute distress.    Appearance: She is well-developed. She is not diaphoretic.  HENT:     Head: Normocephalic and atraumatic.  Eyes:     Pupils: Pupils are equal, round, and reactive to light.  Cardiovascular:     Rate and Rhythm: Normal rate and regular rhythm.     Heart sounds: No murmur heard.   No friction rub. No gallop.  Pulmonary:     Effort: Pulmonary effort is normal.     Breath sounds: No wheezing or rales.  Abdominal:     General: There is no  distension.     Palpations: Abdomen is soft.     Tenderness: There is no abdominal tenderness.  Genitourinary:    Comments: Patient refuses pelvic or rectal exam Musculoskeletal:        General: No tenderness.     Cervical back: Normal range of motion and neck supple.  Skin:    General: Skin is warm and dry.  Neurological:     Mental Status: She is alert and oriented to person, place, and time.  Psychiatric:        Behavior: Behavior normal.    ED Results / Procedures / Treatments   Labs (all labs ordered are listed, but only abnormal results are displayed) Labs Reviewed  CBC - Abnormal; Notable for the following components:      Result Value   RBC 3.63 (*)    Hemoglobin 6.5 (*)    HCT 24.6 (*)    MCV 67.8 (*)    MCH 17.9 (*)    MCHC 26.4 (*)    RDW 25.6 (*)    nRBC 0.4 (*)    All other components within normal limits  URINALYSIS, ROUTINE W REFLEX MICROSCOPIC - Abnormal; Notable for the following components:   APPearance HAZY (*)  Hgb urine dipstick LARGE (*)    Protein, ur 30 (*)    RBC / HPF >50 (*)    All other components within normal limits  PREGNANCY, URINE  TYPE AND SCREEN  PREPARE RBC (CROSSMATCH)    EKG None  Radiology No results found.  Procedures Procedures    Medications Ordered in ED Medications  0.9 %  sodium chloride infusion (Manually program via Guardrails IV Fluids) ( Intravenous New Bag/Given 10/11/21 2223)    ED Course/ Medical Decision Making/ A&P                           Medical Decision Making Amount and/or Complexity of Data Reviewed Labs: ordered.  Risk OTC drugs. Prescription drug management.   Patient is a 46 y.o. female with a cc of vaginal bleeding.  This been an ongoing issue for her.  She has been seen and actually admitted to the hospital for this previously.  He was evaluated by OB/GYN at that time and was found to have fibroids.  Plan for hysterectomy however the patient does have difficulty following up.  She  tells me its not fair that the [facility] has canceled her driver's license.  She tells me she has no other way to get to the office.  She has a history of schizophrenia and she does have some difficulty with understanding however I do not feel confident that she should be held against her will and have an emergent discussion with GYN for possible procedure.  I discussed the case with social work who will come and evaluate the patient and see if they can help her with transportation.  Her hemoglobin today is 6.5.  She is willing to have another blood transfusion.  She would not like to stay in the hospital.  She is refusing exam.  UA independently interpreted by me without infection.  Pregnancy test negative.   Social work saw the patient and they were unsure what they could do to help her get into see GYN.  I did discuss the case with Dr. Roselie Awkward, GYN recommended a more prolonged course of Megace iron supplementation and further encouragement to see them in the office.  CRITICAL CARE Performed by: Cecilio Asper   Total critical care time: 35 minutes  Critical care time was exclusive of separately billable procedures and treating other patients.  Critical care was necessary to treat or prevent imminent or life-threatening deterioration.  Critical care was time spent personally by me on the following activities: development of treatment plan with patient and/or surrogate as well as nursing, discussions with consultants, evaluation of patient's response to treatment, examination of patient, obtaining history from patient or surrogate, ordering and performing treatments and interventions, ordering and review of laboratory studies, ordering and review of radiographic studies, pulse oximetry and re-evaluation of patient's condition.  11:43 PM:  I have discussed the diagnosis/risks/treatment options with the patient.  Evaluation and diagnostic testing in the emergency department does not suggest an  emergent condition requiring admission or immediate intervention beyond what has been performed at this time.  They will follow up with  GYN. We also discussed returning to the ED immediately if new or worsening sx occur. We discussed the sx which are most concerning (e.g., sudden worsening pain, fever, inability to tolerate by mouth, worsening bleeding, syncope) that necessitate immediate return. Medications administered to the patient during their visit and any new prescriptions provided to the patient are listed below.  Medications given during this visit Medications  0.9 %  sodium chloride infusion (Manually program via Guardrails IV Fluids) ( Intravenous New Bag/Given 10/11/21 2223)     The patient appears reasonably screen and/or stabilized for discharge and I doubt any other medical condition or other [facility] requiring further screening, evaluation, or treatment in the ED at this time prior to discharge.          Final Clinical Impression(s) / ED Diagnoses Final diagnoses:  Dysfunctional uterine bleeding    Rx / DC Orders ED Discharge Orders          Ordered    megestrol (MEGACE) 20 MG tablet  2 times daily        10/11/21 2259    ferrous sulfate 325 (65 FE) MG tablet  Daily        10/11/21 2259              Deno Etienne, DO 10/11/21 2343

## 2021-10-11 NOTE — ED Provider Triage Note (Signed)
Emergency Medicine Provider Triage Evaluation Note  Valerie Howard , a 46 y.o. female  was evaluated in triage.  Pt complains of heavy vaginal bleeding that started today.  She does endorse lightheadedness.  States her vaginal bleeding is typically heavy and this is less heavy than usual.  States she usually gets a prescription to help stop the bleeding.  Denies abdominal pain, chest pain, shortness of breath.  Review of Systems  Positive: As above Negative: As above  Physical Exam  BP (!) 112/100    Pulse 96    Temp 99.6 F (37.6 C) (Oral)    Resp 16    SpO2 100%  Gen:   Awake, no distress   Resp:  Normal effort  MSK:   Moves extremities without difficulty Other:    Medical Decision Making  Medically screening exam initiated at 5:36 PM.  Appropriate orders placed.  Samuel L Hochstetler was informed that the remainder of the evaluation will be completed by another provider, this initial triage assessment does not replace that evaluation, and the importance of remaining in the ED until their evaluation is complete.     Evlyn Courier, PA-C 10/11/21 1737

## 2021-10-11 NOTE — Discharge Instructions (Addendum)
Please follow up with GYN in the office to try and help you with your vaginal bleeding.  They are there to help you and can offer different ways to try and reduce or maybe stop your bleeding.

## 2021-10-11 NOTE — ED Notes (Signed)
Hgb 6.5 reported to provider

## 2021-10-12 ENCOUNTER — Other Ambulatory Visit: Payer: Self-pay

## 2021-10-12 ENCOUNTER — Other Ambulatory Visit (HOSPITAL_COMMUNITY): Payer: Self-pay

## 2021-10-12 ENCOUNTER — Encounter (HOSPITAL_COMMUNITY): Payer: Self-pay | Admitting: *Deleted

## 2021-10-12 ENCOUNTER — Emergency Department (HOSPITAL_COMMUNITY)
Admission: EM | Admit: 2021-10-12 | Discharge: 2021-10-12 | Disposition: A | Discharge status: Home / Self Care | Payer: Medicaid Other | Source: Home / Self Care | Attending: Emergency Medicine | Admitting: Emergency Medicine

## 2021-10-12 DIAGNOSIS — N939 Abnormal uterine and vaginal bleeding, unspecified: Secondary | ICD-10-CM | POA: Insufficient documentation

## 2021-10-12 DIAGNOSIS — D649 Anemia, unspecified: Secondary | ICD-10-CM

## 2021-10-12 LAB — BASIC METABOLIC PANEL
Anion gap: 7 (ref 5–15)
BUN: 10 mg/dL (ref 6–20)
CO2: 23 mmol/L (ref 22–32)
Calcium: 9.2 mg/dL (ref 8.9–10.3)
Chloride: 106 mmol/L (ref 98–111)
Creatinine, Ser: 0.91 mg/dL (ref 0.44–1.00)
GFR, Estimated: >60 mL/min (ref >60)
Glucose, Bld: 92 mg/dL (ref 70–99)
Potassium: 4.4 mmol/L (ref 3.5–5.1)
Sodium: 136 mmol/L (ref 135–145)

## 2021-10-12 LAB — CBC
HCT: 28.3 % — ABNORMAL LOW (ref 36.0–46.0)
Hemoglobin: 8 g/dL — ABNORMAL LOW (ref 12.0–15.0)
MCH: 19.7 pg — ABNORMAL LOW (ref 26.0–34.0)
MCHC: 28.3 g/dL — ABNORMAL LOW (ref 30.0–36.0)
MCV: 69.5 fL — ABNORMAL LOW (ref 80.0–100.0)
Platelets: 155 10*3/uL (ref 150–400)
RBC: 4.07 MIL/uL (ref 3.87–5.11)
RDW: 26.3 % — ABNORMAL HIGH (ref 11.5–15.5)
WBC: 5.7 10*3/uL (ref 4.0–10.5)
nRBC: 0 % (ref 0.0–0.2)

## 2021-10-12 MED ORDER — MEGESTROL ACETATE 20 MG PO TABS
20.0000 mg | ORAL_TABLET | Freq: Once | ORAL | Status: AC
  Administered 2021-10-12: 20 mg via ORAL
  Filled 2021-10-12: qty 1

## 2021-10-12 MED ORDER — FERROUS SULFATE 325 (65 FE) MG PO TABS
325.0000 mg | ORAL_TABLET | Freq: Once | ORAL | Status: AC
  Administered 2021-10-12: 325 mg via ORAL
  Filled 2021-10-12: qty 1

## 2021-10-12 MED ORDER — MEGESTROL ACETATE 40 MG PO TABS
20.0000 mg | ORAL_TABLET | Freq: Two times a day (BID) | ORAL | 0 refills | Status: DC
  Filled 2021-10-12: qty 30, 30d supply, fill #0

## 2021-10-12 NOTE — ED Notes (Signed)
Patient uncooperative and refuses discharge vital signs.

## 2021-10-12 NOTE — ED Notes (Signed)
Patient states "I'm not sure why you keep taking blood and then giving it back to me. It makes me question your motives." Patient reminded that her blood was drawn to measure her hemoglobin and then she received blood because of her anemia.

## 2021-10-12 NOTE — ED Notes (Signed)
Pt given gown and supplies for changing. Pt asked to make phone calls prior to getting changed.

## 2021-10-12 NOTE — ED Provider Notes (Signed)
[facility] EMERGENCY DEPARTMENT Provider Note   CSN: [id number] Arrival date & time: 10/12/21  0421     History  Chief Complaint  Patient presents with   Vaginal Bleeding    Valerie Howard is a 46 y.o. female.  Presenting to the emergency room with concern for vaginal bleeding.  Patient reports that while she was waiting in the lobby for the bus she had another episode of vaginal bleeding, large clots.  Has not been soaking through any additional pads since that time.  She otherwise feels fine.  Has not picked up the medicine that was prescribed last night.  Completed extensive chart review including recent ER visit yesterday, admission and discharge summary for August 2022.  Has long history of vaginal bleeding, abnormal uterine bleeding causing symptomatic anemia requiring blood transfusion.  Patient has had difficulty following up with OB GYN due to transportation issues.  HPI     Home Medications Prior to Admission medications   Medication Sig Start Date End Date Taking? Authorizing Provider  ferrous sulfate 325 (65 FE) MG tablet Take 1 tablet (325 mg total) by mouth daily. 10/11/21   Deno Etienne, DO  megestrol (MEGACE) 40 MG tablet Take 1/2 tablets (20 mg total) by mouth 2 (two) times daily. 10/12/21   Lucrezia Starch, MD      Allergies    Penicillins    Review of Systems   Review of Systems  Constitutional:  Negative for chills and fever.  HENT:  Negative for ear pain and sore throat.   Eyes:  Negative for pain and visual disturbance.  Respiratory:  Negative for cough and shortness of breath.   Cardiovascular:  Negative for chest pain and palpitations.  Gastrointestinal:  Negative for abdominal pain and vomiting.  Genitourinary:  Positive for vaginal bleeding. Negative for dysuria and hematuria.  Musculoskeletal:  Negative for arthralgias and back pain.  Skin:  Negative for color change and rash.  Neurological:  Negative for seizures and syncope.   All other systems reviewed and are negative.  Physical Exam Updated Vital Signs BP (!) 141/89 (BP Location: Right Arm)    Pulse 83    Temp 98.6 F (37 C) (Oral)    Resp 17    SpO2 99%  Physical Exam Vitals and nursing note reviewed.  Constitutional:      General: She is not in acute distress.    Appearance: She is well-developed.  HENT:     Head: Normocephalic and atraumatic.  Eyes:     Conjunctiva/sclera: Conjunctivae normal.  Cardiovascular:     Rate and Rhythm: Normal rate and regular rhythm.     Heart sounds: No murmur heard. Pulmonary:     Effort: Pulmonary effort is normal. No respiratory distress.     Breath sounds: Normal breath sounds.  Abdominal:     Palpations: Abdomen is soft.     Tenderness: There is no abdominal tenderness.  Musculoskeletal:        General: No swelling.     Cervical back: Neck supple.  Skin:    General: Skin is warm and dry.     Capillary Refill: Capillary refill takes less than 2 seconds.  Neurological:     General: No focal deficit present.     Mental Status: She is alert.  Psychiatric:        Mood and Affect: Mood normal.    ED Results / Procedures / Treatments   Labs (all labs ordered are listed, but only abnormal  results are displayed) Labs Reviewed  CBC - Abnormal; Notable for the following components:      Result Value   Hemoglobin 8.0 (*)    HCT 28.3 (*)    MCV 69.5 (*)    MCH 19.7 (*)    MCHC 28.3 (*)    RDW 26.3 (*)    All other components within normal limits  BASIC METABOLIC PANEL  I-STAT BETA HCG BLOOD, ED ([facility], [facility], [facility] ONLY)    EKG None  Radiology No results found.  Procedures Procedures    Medications Ordered in ED Medications  megestrol (MEGACE) tablet 20 mg (20 mg Oral Given 10/12/21 0852)  ferrous sulfate tablet 325 mg (325 mg Oral Given 10/12/21 4287)    ED Course/ Medical Decision Making/ A&P                           Medical Decision Making Amount and/or Complexity of Data Reviewed Labs:  ordered.  Risk OTC drugs. Prescription drug management.   46 year old lady presenting to ER with concern for vaginal bleeding.  Reviewed recent ER visit, reviewed past discharge summary.  Has lengthy history of abnormal uterine bleeding requiring transfusion previously.  In ER today patient appears well and has no ongoing symptoms.  Her hemoglobin is 8, markedly improved since last check yesterday prior to transfusion.  Given current level of hemoglobin and lack of any ongoing symptoms or ongoing bleeding, feel she is appropriate for discharge and outpatient management.  Do not feel she requires inpatient admission at this time or repeat transfusion.  Patient endorses transportation issues and difficulty with following up and picking up her medications.  Was prescribed Megace yesterday.  Pt does not want to take iron supplement despite my recommendations and prior recommendation from others. Consulted case management who was able to arrange for medications to be delivered to patient.  Stressed need that patient will need follow-up with gynecology for long-term prevention.  Reviewed return precautions and discharge patient home.        Final Clinical Impression(s) / ED Diagnoses Final diagnoses:  Vaginal bleeding  Anemia, unspecified type    Rx / DC Orders ED Discharge Orders          Ordered    megestrol (MEGACE) 40 MG tablet  2 times daily        10/12/21 0839              Lucrezia Starch, MD 10/12/21 1023

## 2021-10-12 NOTE — ED Triage Notes (Signed)
Pt was just d/c. Pt states while waiting in the lobby for the bus, she coughed and had a "large amount of uterine bleeding". Alert, oriented, sitting up in w/c on phone during triage.

## 2021-10-12 NOTE — Discharge Instructions (Signed)
Please follow-up with gynecology.  Call the office to make an appointment.  Take the Megace as prescribed.  Come back to ER for worsening vaginal bleeding, any episodes of passing out, lightheadedness, shortness of breath or other new concerning symptom.

## 2021-10-12 NOTE — ED Notes (Signed)
Pt refusing labs at this time.

## 2021-10-12 NOTE — ED Notes (Signed)
Patient refusing to leave room stating she needs replacement pants due to bleeding on the clothes she wore to ED. Patient offered paper scrub pants but patient refused stating "I need real pants."

## 2021-10-12 NOTE — Progress Notes (Signed)
RNCM consulted in regards to medication assistance.  Pt has insurance coverage and is not eligible for Medication Assistance Through [facility]) program.  No further CM needs communicated at this time.  [facility] will deliver Rx to patient at bedside prior to discharge from hospital.

## 2021-10-14 LAB — TYPE AND SCREEN
ABO/RH(D): O POS
Antibody Screen: POSITIVE
Donor AG Type: NEGATIVE
Donor AG Type: NEGATIVE
Unit division: 0
Unit division: 0

## 2021-10-14 LAB — BPAM RBC
Blood Product Expiration Date: 202303012359
Blood Product Expiration Date: 202303152359
ISSUE DATE / TIME: 202302132205
Unit Type and Rh: 5100
Unit Type and Rh: 9500

## 2021-11-16 ENCOUNTER — Encounter: Payer: Self-pay | Admitting: Family Medicine

## 2021-11-29 ENCOUNTER — Other Ambulatory Visit: Payer: Self-pay

## 2021-11-29 ENCOUNTER — Emergency Department (HOSPITAL_COMMUNITY): Payer: Medicaid Other

## 2021-11-29 ENCOUNTER — Emergency Department (HOSPITAL_COMMUNITY)
Admission: EM | Admit: 2021-11-29 | Discharge: 2021-11-30 | Disposition: A | Discharge status: Home / Self Care | Payer: Medicaid Other | Attending: Emergency Medicine | Admitting: Emergency Medicine

## 2021-11-29 ENCOUNTER — Encounter (HOSPITAL_COMMUNITY): Payer: Self-pay

## 2021-11-29 DIAGNOSIS — L988 Other specified disorders of the skin and subcutaneous tissue: Secondary | ICD-10-CM | POA: Diagnosis not present

## 2021-11-29 DIAGNOSIS — J45909 Unspecified asthma, uncomplicated: Secondary | ICD-10-CM | POA: Diagnosis not present

## 2021-11-29 DIAGNOSIS — Z20822 Contact with and (suspected) exposure to covid-19: Secondary | ICD-10-CM | POA: Insufficient documentation

## 2021-11-29 DIAGNOSIS — R051 Acute cough: Secondary | ICD-10-CM | POA: Diagnosis not present

## 2021-11-29 DIAGNOSIS — R234 Changes in skin texture: Secondary | ICD-10-CM

## 2021-11-29 DIAGNOSIS — R059 Cough, unspecified: Secondary | ICD-10-CM | POA: Diagnosis present

## 2021-11-29 LAB — RESP PANEL BY RT-PCR (FLU A&B, COVID) ARPGX2
Influenza A by PCR: NEGATIVE
Influenza B by PCR: NEGATIVE
SARS Coronavirus 2 by RT PCR: NEGATIVE

## 2021-11-29 NOTE — ED Provider Triage Note (Signed)
Emergency Medicine Provider Triage Evaluation Note ? ?Lorenda Hatchet , a 46 y.o. female  was evaluated in triage.  Pt complains of cough for the past 2 days.  States some pain with coughing.  Denies SOB, fever, chills, sweats, or sick contacts.  No cardiac history.  Also has "corn" on her foot she wants checked. ? ?Review of Systems  ?Positive: Cough, foot issue ?Negative: fever ? ?Physical Exam  ?BP 140/66 (BP Location: Right Arm)   Pulse 98   Temp 99.5 ?F (37.5 ?C) (Oral)   Resp 16   SpO2 99%  ? ?Gen:   Awake, no distress   ?Resp:  Normal effort  ?MSK:   Moves extremities without difficulty  ?Other:  Small area of dislocation to ball of right foot, no open wound noted ? ?Medical Decision Making  ?Medically screening exam initiated at 10:16 PM.  Appropriate orders placed.  Salimatou L Deshotel was informed that the remainder of the evaluation will be completed by another provider, this initial triage assessment does not replace that evaluation, and the importance of remaining in the ED until their evaluation is complete. ? ?Cough.  Covid swab/ CXR.  Also wants foot re-checked.  ?  ?Larene Pickett, PA-C ?11/29/21 2218 ? ?

## 2021-11-29 NOTE — ED Triage Notes (Signed)
Pt states she is here today due to cough and chest pain when she coughs. Pt also reports cut on her right foot on the bottom of her foot.  ?

## 2021-11-30 MED ORDER — ALBUTEROL SULFATE HFA 108 (90 BASE) MCG/ACT IN AERS
2.0000 | INHALATION_SPRAY | Freq: Once | RESPIRATORY_TRACT | Status: AC
  Administered 2021-11-30: 2 via RESPIRATORY_TRACT
  Filled 2021-11-30: qty 6.7

## 2021-11-30 NOTE — Discharge Instructions (Signed)
You were seen today with a cough and concern for foot wound.  You will be given an albuterol inhaler for cough.  Your chest x-ray and viral testing is negative.  You have a crack from dry skin on the bottom of your feet.  You may apply topical antibiotic ointment.  Keep the skin moisturized but dry.  Monitor closely for signs and symptoms of infection. ?

## 2021-11-30 NOTE — ED Provider Notes (Signed)
?[facility] ?Provider Note ? ? ?CSN: [id number] ?Arrival date & time: 11/29/21  2048 ? ?  ? ?History ? ?Chief Complaint  ?Patient presents with  ? Cough  ? ? ?LAKEYIA SURBER is a 46 y.o. female. ? ?HPI ? ?  ? ?This 46 year old female who presents with cough and a foot wound.  Patient states that she has a cough for the last 2 days.  Cough is nonproductive.  No fevers.  No known sick contacts.  She does have a history of exercise-induced asthma.  She has not taken anything for her symptoms.  Additionally she is concerned about a possible wound on her right foot.  She has not noticed any drainage or redness.  She does report some pain.  She reports that she has kept her feet dry and is not doing excessive walking. ? ?Home Medications ?Prior to Admission medications   ?Medication Sig Start Date End Date Taking? Authorizing Provider  ?ferrous sulfate 325 (65 FE) MG tablet Take 1 tablet (325 mg total) by mouth daily. 10/11/21   Deno Etienne, DO  ?megestrol (MEGACE) 40 MG tablet Take 1/2 tablets (20 mg total) by mouth 2 (two) times daily. 10/12/21   Lucrezia Starch, MD  ?   ? ?Allergies    ?Penicillins   ? ?Review of Systems   ?Review of Systems  ?Constitutional:  Negative for fever.  ?Respiratory:  Positive for cough. Negative for shortness of breath.   ?Cardiovascular:  Negative for chest pain.  ?Skin:  Positive for wound.  ?All other systems reviewed and are negative. ? ?Physical Exam ?Updated Vital Signs ?BP 140/66 (BP Location: Right Arm)   Pulse 98   Temp 99.5 ?F (37.5 ?C) (Oral)   Resp 16   SpO2 99%  ?Physical Exam ?Vitals and nursing note reviewed.  ?Constitutional:   ?   Appearance: She is well-developed. She is obese. She is not ill-appearing.  ?HENT:  ?   Head: Normocephalic and atraumatic.  ?   Mouth/Throat:  ?   Mouth: Mucous membranes are moist.  ?Eyes:  ?   Pupils: Pupils are equal, round, and reactive to light.  ?Cardiovascular:  ?   Rate and Rhythm: Normal rate and  regular rhythm.  ?   Heart sounds: Normal heart sounds.  ?Pulmonary:  ?   Effort: Pulmonary effort is normal. No respiratory distress.  ?   Breath sounds: No wheezing.  ?Abdominal:  ?   Palpations: Abdomen is soft.  ?Musculoskeletal:     ?   General: No swelling.  ?   Cervical back: Neck supple.  ?   Right lower leg: No edema.  ?   Left lower leg: No edema.  ?Skin: ?   General: Skin is warm and dry.  ?   Comments: Dry skin with callus noted over the ball of the right foot with a crack in the skin medially, no drainage, no erythema  ?Neurological:  ?   Mental Status: She is alert and oriented to person, place, and time.  ?Psychiatric:     ?   Mood and Affect: Mood normal.  ? ? ?ED Results / Procedures / Treatments   ?Labs ?(all labs ordered are listed, but only abnormal results are displayed) ?Labs Reviewed  ?RESP PANEL BY RT-PCR (FLU A&B, COVID) [id number]  ? ? ?EKG ?None ? ?Radiology ?DG Chest 2 View ? ?Result Date: 11/29/2021 ?CLINICAL DATA:  Productive cough. EXAM: CHEST - 2 VIEW COMPARISON:  Chest x-ray  01/21/2020. FINDINGS: The heart size and mediastinal contours are within normal limits. Both lungs are clear. The visualized skeletal structures are unremarkable. IMPRESSION: No active cardiopulmonary disease. Electronically Signed   By: Ronney Asters M.D.   On: 11/29/2021 22:49   ? ?Procedures ?Procedures  ? ? ?Medications Ordered in ED ?Medications  ?albuterol (VENTOLIN HFA) 108 (90 Base) MCG/ACT inhaler 2 puff (has no administration in time range)  ? ? ?ED Course/ Medical Decision Making/ A&P ?  ?                        ?Medical Decision Making ?Risk ?Prescription drug management. ? ? ?This patient presents to the ED for concern of cough and skin wound, this involves an extensive number of treatment options, and is a complaint that carries with it a high risk of complications and morbidity.  The differential diagnosis includes asthma, pneumonia, viral infection.  Regarding skin wound, cellulitis, dry skin,  ulcer ? ?MDM:   ? ?This is a 46 year old female who presents with cough and a lesion on the bottom of her foot.  She is nontoxic and vital signs are reassuring.  Regarding her cough, there is no red flags.  Her pulmonary exam is reassuring.  She is not hypoxic.  She is in no respiratory distress.  She has a history of asthma but no wheezing.  Chest x-ray without pneumothorax or pneumonia.  COVID and influenza testing negative.  Will provide an inhaler given her history of asthma as this may help with any bronchospasm.  Regarding her foot, there is no obvious infection.  She does have cracked skin which looks related to dryness and callus formation.  We discussed keeping it moisturized and dry.  Patient stated understanding.  She will monitor for signs and symptoms of infection. ? ?(Labs, imaging) ? ?Labs: ?I Ordered, and personally interpreted labs.  The pertinent results include: Negative COVID and influenza test ? ?Imaging Studies ordered: ?I ordered imaging studies including chest x-ray without pneumothorax or pneumonia ?I independently visualized and interpreted imaging. ?I agree with the radiologist interpretation ? ?Additional history obtained from chart review.  External records from outside source obtained and reviewed including prior visit ? ?Critical Interventions: ?Albuterol inhaler ? ?Consultations: ?I requested consultation with the NA,  and discussed lab and imaging findings as well as pertinent plan - they recommend: N/A ? ?Cardiac Monitoring: ?The patient was maintained on a cardiac monitor.  I personally viewed and interpreted the cardiac monitored which showed an underlying rhythm of: Sinus rhythm ? ?Reevaluation: ?After the interventions noted above, I reevaluated the patient and found that they have :stayed the same ? ? ?Considered admission for: N/A ? ?Social Determinants of Health: ?h/o schizophrenia ? ?Disposition: Discharge ? ?Co morbidities that complicate the patient evaluation ? ?Past  Medical History:  ?Diagnosis Date  ? History of radiation therapy 07/18/17-07/24/17  ? right ear, operative bed 12 Gy in 3 fractions  ? Keloid   ? right ear  ? Paranoid behavior ([place])   ? Schizophrenia ([place])   ?  ? ?Medicines ?Meds ordered this encounter  ?Medications  ? albuterol (VENTOLIN HFA) 108 (90 Base) MCG/ACT inhaler 2 puff  ?  ?I have reviewed the patients home medicines and have made adjustments as needed ? ?Problem List / ED Course: ?Problem List Items Addressed This Visit   ?None ?Visit Diagnoses   ? ? Acute cough    -  Primary  ? Cracked skin on feet      ? ?  ?  ? ? ? ? ? ? ? ? ? ? ? ? ?  Final Clinical Impression(s) / ED Diagnoses ?Final diagnoses:  ?Acute cough  ?Cracked skin on feet  ? ? ?Rx / DC Orders ?ED Discharge Orders   ? ? None  ? ?  ? ? ?  ?Merryl Hacker, MD ?11/30/21 0103 ? ?

## 2022-07-28 ENCOUNTER — Emergency Department (HOSPITAL_COMMUNITY): Payer: Medicaid Other

## 2022-07-28 ENCOUNTER — Encounter (HOSPITAL_COMMUNITY): Payer: Self-pay | Admitting: Emergency Medicine

## 2022-07-28 ENCOUNTER — Inpatient Hospital Stay (HOSPITAL_COMMUNITY)
Admission: EM | Admit: 2022-07-28 | Discharge: 2022-08-04 | DRG: 760 | Disposition: A | Discharge status: Home / Self Care | Payer: Medicaid Other | Attending: Family Medicine | Admitting: Family Medicine

## 2022-07-28 DIAGNOSIS — F209 Schizophrenia, unspecified: Secondary | ICD-10-CM | POA: Diagnosis present

## 2022-07-28 DIAGNOSIS — D62 Acute posthemorrhagic anemia: Secondary | ICD-10-CM | POA: Diagnosis not present

## 2022-07-28 DIAGNOSIS — N938 Other specified abnormal uterine and vaginal bleeding: Secondary | ICD-10-CM | POA: Diagnosis present

## 2022-07-28 DIAGNOSIS — R051 Acute cough: Secondary | ICD-10-CM | POA: Diagnosis not present

## 2022-07-28 DIAGNOSIS — Z20822 Contact with and (suspected) exposure to covid-19: Secondary | ICD-10-CM | POA: Diagnosis present

## 2022-07-28 DIAGNOSIS — Z5900 Homelessness unspecified: Secondary | ICD-10-CM

## 2022-07-28 DIAGNOSIS — Z79899 Other long term (current) drug therapy: Secondary | ICD-10-CM

## 2022-07-28 DIAGNOSIS — N939 Abnormal uterine and vaginal bleeding, unspecified: Secondary | ICD-10-CM

## 2022-07-28 DIAGNOSIS — R509 Fever, unspecified: Secondary | ICD-10-CM

## 2022-07-28 DIAGNOSIS — Z880 Allergy status to penicillin: Secondary | ICD-10-CM

## 2022-07-28 DIAGNOSIS — E538 Deficiency of other specified B group vitamins: Secondary | ICD-10-CM | POA: Diagnosis present

## 2022-07-28 DIAGNOSIS — D219 Benign neoplasm of connective and other soft tissue, unspecified: Secondary | ICD-10-CM | POA: Diagnosis not present

## 2022-07-28 DIAGNOSIS — N920 Excessive and frequent menstruation with regular cycle: Secondary | ICD-10-CM | POA: Insufficient documentation

## 2022-07-28 DIAGNOSIS — Z9851 Tubal ligation status: Secondary | ICD-10-CM

## 2022-07-28 DIAGNOSIS — D259 Leiomyoma of uterus, unspecified: Principal | ICD-10-CM | POA: Diagnosis present

## 2022-07-28 DIAGNOSIS — D649 Anemia, unspecified: Secondary | ICD-10-CM | POA: Diagnosis present

## 2022-07-28 DIAGNOSIS — D509 Iron deficiency anemia, unspecified: Secondary | ICD-10-CM | POA: Diagnosis present

## 2022-07-28 DIAGNOSIS — J101 Influenza due to other identified influenza virus with other respiratory manifestations: Secondary | ICD-10-CM | POA: Insufficient documentation

## 2022-07-28 LAB — COMPREHENSIVE METABOLIC PANEL
ALT: 9 U/L (ref 0–44)
AST: 20 U/L (ref 15–41)
Albumin: 3.5 g/dL (ref 3.5–5.0)
Alkaline Phosphatase: 53 U/L (ref 38–126)
Anion gap: 9 (ref 5–15)
BUN: 12 mg/dL (ref 6–20)
CO2: 18 mmol/L — ABNORMAL LOW (ref 22–32)
Calcium: 8.4 mg/dL — ABNORMAL LOW (ref 8.9–10.3)
Chloride: 113 mmol/L — ABNORMAL HIGH (ref 98–111)
Creatinine, Ser: 0.97 mg/dL (ref 0.44–1.00)
GFR, Estimated: >60 mL/min (ref >60)
Glucose, Bld: 113 mg/dL — ABNORMAL HIGH (ref 70–99)
Potassium: 4 mmol/L (ref 3.5–5.1)
Sodium: 140 mmol/L (ref 135–145)
Total Bilirubin: 0.5 mg/dL (ref 0.3–1.2)
Total Protein: 6.7 g/dL (ref 6.5–8.1)

## 2022-07-28 LAB — I-STAT CHEM 8, ED
BUN: 12 mg/dL (ref 6–20)
Calcium, Ion: 1.13 mmol/L — ABNORMAL LOW (ref 1.15–1.40)
Chloride: 109 mmol/L (ref 98–111)
Creatinine, Ser: 0.9 mg/dL (ref 0.44–1.00)
Glucose, Bld: 118 mg/dL — ABNORMAL HIGH (ref 70–99)
HCT: 20 % — ABNORMAL LOW (ref 36.0–46.0)
Hemoglobin: 6.8 g/dL — CL (ref 12.0–15.0)
Potassium: 3.8 mmol/L (ref 3.5–5.1)
Sodium: 142 mmol/L (ref 135–145)
TCO2: 21 mmol/L — ABNORMAL LOW (ref 22–32)

## 2022-07-28 LAB — CBC WITH DIFFERENTIAL/PLATELET
Abs Immature Granulocytes: 0.02 10*3/uL (ref 0.00–0.07)
Basophils Absolute: 0 10*3/uL (ref 0.0–0.1)
Basophils Relative: 1 %
Eosinophils Absolute: 0 10*3/uL (ref 0.0–0.5)
Eosinophils Relative: 1 %
HCT: 20 % — ABNORMAL LOW (ref 36.0–46.0)
Hemoglobin: 5.2 g/dL — CL (ref 12.0–15.0)
Immature Granulocytes: 0 %
Lymphocytes Relative: 15 %
Lymphs Abs: 1 10*3/uL (ref 0.7–4.0)
MCH: 18.6 pg — ABNORMAL LOW (ref 26.0–34.0)
MCHC: 26 g/dL — ABNORMAL LOW (ref 30.0–36.0)
MCV: 71.7 fL — ABNORMAL LOW (ref 80.0–100.0)
Monocytes Absolute: 0.4 10*3/uL (ref 0.1–1.0)
Monocytes Relative: 6 %
Neutro Abs: 5 10*3/uL (ref 1.7–7.7)
Neutrophils Relative %: 77 %
Platelets: 265 10*3/uL (ref 150–400)
RBC: 2.79 MIL/uL — ABNORMAL LOW (ref 3.87–5.11)
RDW: 20.2 % — ABNORMAL HIGH (ref 11.5–15.5)
WBC: 6.5 10*3/uL (ref 4.0–10.5)
nRBC: 0 % (ref 0.0–0.2)

## 2022-07-28 LAB — SARS CORONAVIRUS 2 BY RT PCR: SARS Coronavirus 2 by RT PCR: NEGATIVE

## 2022-07-28 LAB — I-STAT BETA HCG BLOOD, ED (MC, WL, AP ONLY): I-stat hCG, quantitative: <5 m[IU]/mL (ref <5)

## 2022-07-28 LAB — PREPARE RBC (CROSSMATCH)

## 2022-07-28 LAB — ACETAMINOPHEN LEVEL: Acetaminophen (Tylenol), Serum: <10 ug/mL — ABNORMAL LOW (ref 10–30)

## 2022-07-28 LAB — ETHANOL: Alcohol, Ethyl (B): <10 mg/dL (ref <10)

## 2022-07-28 LAB — LACTIC ACID, PLASMA: Lactic Acid, Venous: 1.3 mmol/L (ref 0.5–1.9)

## 2022-07-28 LAB — SALICYLATE LEVEL: Salicylate Lvl: <7 mg/dL — ABNORMAL LOW (ref 7.0–30.0)

## 2022-07-28 MED ORDER — MEGESTROL ACETATE 40 MG PO TABS
80.0000 mg | ORAL_TABLET | Freq: Every day | ORAL | Status: DC
  Administered 2022-07-28 – 2022-08-04 (×8): 80 mg via ORAL
  Filled 2022-07-28 (×8): qty 2

## 2022-07-28 MED ORDER — ADULT MULTIVITAMIN W/MINERALS CH
1.0000 | ORAL_TABLET | Freq: Every day | ORAL | Status: DC
  Filled 2022-07-28 (×6): qty 1

## 2022-07-28 MED ORDER — ACETAMINOPHEN 325 MG PO TABS
650.0000 mg | ORAL_TABLET | Freq: Four times a day (QID) | ORAL | Status: DC | PRN
  Administered 2022-07-28 – 2022-08-03 (×11): 650 mg via ORAL
  Filled 2022-07-28 (×12): qty 2

## 2022-07-28 MED ORDER — ONDANSETRON HCL 4 MG/2ML IJ SOLN
4.0000 mg | Freq: Four times a day (QID) | INTRAMUSCULAR | Status: DC | PRN
  Administered 2022-08-01: 4 mg via INTRAVENOUS
  Filled 2022-07-28: qty 2

## 2022-07-28 MED ORDER — THIAMINE MONONITRATE 100 MG PO TABS
100.0000 mg | ORAL_TABLET | Freq: Every day | ORAL | Status: DC
  Filled 2022-07-28 (×4): qty 1

## 2022-07-28 MED ORDER — ACETAMINOPHEN 500 MG PO TABS
1000.0000 mg | ORAL_TABLET | Freq: Once | ORAL | Status: AC
Start: 2022-07-28 — End: 2022-07-28
  Administered 2022-07-28: 1000 mg via ORAL
  Filled 2022-07-28: qty 2

## 2022-07-28 MED ORDER — SODIUM CHLORIDE 0.9 % IV SOLN: INTRAVENOUS | Status: DC

## 2022-07-28 MED ORDER — SODIUM CHLORIDE 0.9 % IV SOLN: 10.0000 mL/h | Freq: Once | INTRAVENOUS | Status: DC

## 2022-07-28 MED ORDER — ONDANSETRON HCL 4 MG PO TABS: 4.0000 mg | ORAL_TABLET | Freq: Four times a day (QID) | ORAL | Status: DC | PRN

## 2022-07-28 MED ORDER — ACETAMINOPHEN 650 MG RE SUPP: 650.0000 mg | Freq: Four times a day (QID) | RECTAL | Status: DC | PRN

## 2022-07-28 MED ORDER — ONDANSETRON HCL 4 MG/2ML IJ SOLN
4.0000 mg | Freq: Once | INTRAMUSCULAR | Status: AC
  Administered 2022-07-28: 4 mg via INTRAVENOUS
  Filled 2022-07-28: qty 2

## 2022-07-28 NOTE — ED Notes (Signed)
Refuses [place]. Explained risks and need for [place]. Pt refuses several times. MD made aware.

## 2022-07-28 NOTE — ED Notes (Signed)
Pt Aox4. Ambulates to restroom to change gown. IV placed. Blood bank reports delay for blood units due to extensive antibody testing needed. MD made aware.

## 2022-07-28 NOTE — Consult Note (Addendum)
OBSTETRICS AND GYNECOLOGY ATTENDING CONSULT NOTE  Consult Date: 07/28/2022 Reason for Consult: Symptomatic anemia due to acute blood loss, abnormal uterine bleeding, fibroids Consulting Provider: Dr. Fredia Sorrow  Assessment/Plan: Continue transfusion given symptomatic anemia and hemoglobin of 5.2. 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 Megace 80 mg daily initiated for bleeding for now Patient is refusing pelvic exam and pelvic ultrasound, will continue to monitor pad counts, follow labs and symptoms. Admission fever of 101.3, patient reports productive cough but no other respiratory symptoms, negative CXR, negative COVID and normal WBC of 6.5.  UA pending.  Lactic acid 1.3.  Nontender abdomen, no GYN etiology. Will defer further evaluation and management to [facility].  Appreciate care of [place] by her primary team Will follow along as needed. Please call [phone] [facility] OB/GYN Consult Attending Monday-Friday 8am - 5pm) or [phone] [facility] OB[facility] Attending On Call all day, every day) for any gynecologic concerns at any time.  Thank you for involving [place] in the care of this patient.  Total consultation time including face-to-face time with patient (>50% of time), reviewing chart and documentation: 60 minutes  Verita Schneiders, MD, [place], [place] for [facility]   History of Present Illness: Valerie Howard is an 46 y.o. female with known history of fibroids and AUB who presented to ER with bleeding x 4 days, accompanied by lightheadedness and weakness.  Last transfused in February 2023, does not follow up with recommended outpatient GYN care as recommended during several prior ED visits. She adamantly declines hysterectomy, this has been suggested to her in previous ED visits.  History of schizophrenia, but denies any active hallucinations or suicidal ideation.  In the  ED, she was found to have a fever to 101.3, pulse of 118 and normal BP and O2 sats.  Labs showed WBC 6.5, lactic acid 1.3, negative COVID. UA pending, unable to get sample from patient. CXR negative.  Patient declined pelvic exam in ER.   On my evaluation, patient was noted to have a productive cough, she reports that this has been going on for about one day.  She is receiving transfusion of pRBCs and tolerating it well.  Denies any abdominal pain, dysuria, nausea, vomiting, other GI or GU symptoms or other general symptoms.  Pertinent OB/GYN History: Patient's last menstrual period was 07/25/2022. OB History  No obstetric history on file.  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   Schizoaffective disorder, bipolar type ([place])    Menorrhagia with irregular cycle 05/12/2014   Personality disorder ([place]) 10/17/2012   Anemia, iron deficiency 10/13/2012   Tachycardia 10/01/2012    Past Medical History:  Diagnosis Date   History of radiation therapy 07/18/17-07/24/17   right ear, operative bed 12 Gy in 3 fractions   Keloid    right ear   Paranoid behavior ([place])    Schizophrenia ([place])     Past Surgical History:  Procedure Laterality Date   CESAREAN SECTION     KENALOG INJECTION Bilateral 07/17/2017   Procedure: KENALOG INJECTION;  Surgeon: Wallace Going, DO;  Location: [facility];  Service: Plastics;  Laterality: Bilateral;   MASS EXCISION N/A 07/17/2017   Procedure: EXCISION KELOID OF RIGHT  EAR WITH INTROPERATIVE KENALOG INJECTION AND POST OP RADIATION;  Surgeon: Wallace Going, DO;  Location: [facility];  Service: Plastics;  Laterality: N/A;   TUBAL LIGATION      Family History  Problem Relation Age of Onset   Hypertension Other     Diabetes Other    Cancer Other    Keloids Mother    Breast cancer Maternal Grandmother     Social History:  reports that she has never smoked. She has never used smokeless tobacco. She reports current alcohol use. She reports that she does not use drugs.  Allergies:  Allergies  Allergen Reactions   Penicillins Hives and Itching    Has patient had a PCN reaction causing immediate rash, facial/tongue/throat swelling, SOB or lightheadedness with hypotension:Patient refuses to answer (PRA) Has patient had a PCN reaction causing severe rash involving mucus membranes or skin necrosis:PRA Has patient had a PCN reaction that required hospitalization PRA Has patient had a PCN reaction occurring within the last 10 years:PRA If all of the above answers are "NO", then may proceed with Cephalosporin use.     Medications: I have reviewed the patient's current medications. Prior to Admission: (Not in a hospital admission)  Scheduled:  megestrol  80 mg Oral Daily   Continuous:  sodium chloride     sodium chloride     Review of Systems: Pertinent items noted in HPI and remainder of comprehensive ROS otherwise negative.  Focused Physical Examination: BP 117/62   Pulse 100   Temp (!) 101.3 F (38.5 C) (Oral)   Resp 14   LMP 07/25/2022   SpO2 100%  CONSTITUTIONAL: No acute distress.  NEUROLOGIC: Alert and oriented to person, place, and time. Normal reflexes, muscle tone coordination. No cranial nerve deficit noted. PSYCHIATRIC: Normal mood and affect.  CARDIOVASCULAR: Elevated heart rate noted, regular rhythm RESPIRATORY: Clear to auscultation bilaterally. Effort and breath sounds normal, no problems with respiration noted. ABDOMEN: Soft, normal bowel sounds, no distention noted.  No tenderness, rebound or guarding.  PELVIC: Declined by patient MUSCULOSKELETAL: Normal range of motion. No tenderness.  No cyanosis, clubbing, or edema.  2+ distal pulses.  Labs and Imaging: Results for  orders placed or performed during the hospital encounter of 07/28/22 (from the past 72 hour(s))  Comprehensive metabolic panel     Status: Abnormal   Collection Time: 07/28/22  1:00 PM  Result Value Ref Range   Sodium 140 135 - 145 mmol/L   Potassium 4.0 3.5 - 5.1 mmol/L   Chloride 113 (H) 98 - 111 mmol/L   CO2 18 (L) 22 - 32 mmol/L   Glucose, Bld 113 (H) 70 - 99 mg/dL    Comment: Glucose reference range applies only to samples taken after fasting for at least 8 hours.   BUN 12 6 - 20 mg/dL   Creatinine, Ser 0.97 0.44 - 1.00 mg/dL   Calcium 8.4 (L) 8.9 - 10.3 mg/dL   Total Protein 6.7 6.5 - 8.1 g/dL   Albumin 3.5 3.5 - 5.0 g/dL   AST 20 15 - 41 U/L   ALT 9 0 - 44 U/L   Alkaline Phosphatase 53 38 - 126 U/L   Total Bilirubin 0.5 0.3 - 1.2 mg/dL   GFR, Estimated >60 >60 mL/min    Comment: (NOTE) Calculated using the CKD-EPI Creatinine Equation (2021)    Anion gap 9 5 - 15    Comment: Performed at [facility] [address] [place], [place] [address]  CBC with Differential     Status: Abnormal   Collection Time: 07/28/22  1:00 PM  Result Value Ref Range   WBC 6.5 4.0 - 10.5 K/uL   RBC 2.79 (L) 3.87 - 5.11 MIL/uL   Hemoglobin 5.2 (LL) 12.0 - 15.0 g/dL    Comment: REPEATED TO VERIFY Reticulocyte Hemoglobin testing may be clinically indicated, consider ordering this additional test [id number] THIS CRITICAL RESULT HAS VERIFIED AND BEEN CALLED TO M. BARBER, RN BY LEAH KLAR ON 11 30 2023 AT 1444, AND HAS BEEN READ BACK.     HCT 20.0 (L) 36.0 - 46.0 %   MCV 71.7 (L) 80.0 - 100.0 fL   MCH 18.6 (L) 26.0 - 34.0 pg   MCHC 26.0 (L) 30.0 - 36.0 g/dL   RDW 20.2 (H) 11.5 - 15.5 %   Platelets 265 150 - 400 K/uL    Comment: REPEATED TO VERIFY   nRBC 0.0 0.0 - 0.2 %   Neutrophils Relative % 77 %   Neutro Abs 5.0 1.7 - 7.7 K/uL   Lymphocytes Relative 15 %   Lymphs Abs 1.0 0.7 - 4.0 K/uL   Monocytes Relative 6 %   Monocytes Absolute 0.4 0.1 - 1.0 K/uL   Eosinophils Relative 1 %    Eosinophils Absolute 0.0 0.0 - 0.5 K/uL   Basophils Relative 1 %   Basophils Absolute 0.0 0.0 - 0.1 K/uL   Immature Granulocytes 0 %   Abs Immature Granulocytes 0.02 0.00 - 0.07 K/uL    Comment: Performed at [facility], [address], [place], [place] [address]  SARS Coronavirus 2 by RT PCR (hospital order, performed in [facility] hospital lab) *cepheid single result test* Anterior Nasal Swab     Status: None   Collection Time: 07/28/22  1:04 PM   Specimen: Anterior Nasal Swab  Result Value Ref Range   SARS Coronavirus 2 by RT PCR NEGATIVE NEGATIVE    Comment: (NOTE) SARS-CoV-2 target nucleic acids are NOT DETECTED.  The SARS-CoV-2 RNA is generally detectable in upper and lower respiratory specimens during the acute phase of infection. The lowest concentration of SARS-CoV-2 viral copies this assay can detect is 250 copies / mL. A negative result does not preclude SARS-CoV-2 infection and should not be used as the sole basis for treatment or other patient management decisions.  A negative result may occur with improper specimen collection / handling, submission of specimen other than nasopharyngeal swab, presence of viral mutation(s) within the areas targeted by this assay, and inadequate number of viral copies (<250 copies / mL). A negative result must be combined with clinical observations, patient history, and epidemiological information.  Fact Sheet for Patients:   https://www.patel.info/  Fact Sheet for Healthcare Providers: https://hall.com/  This test is not yet approved or  cleared by the [place] FDA and has been authorized for detection and/or diagnosis of SARS-CoV-2 by FDA under an Emergency Use Authorization (EUA).  This EUA will remain in effect (meaning this test can be used) for the duration of the COVID-19 declaration under Section 564(b)(1) of the Act, 21 U.S.C. section 360bbb-3(b)(1), unless the  authorization is terminated or revoked sooner.  Performed at [facility], [place] [address] [place], [place] [address]   Ethanol     Status: None   Collection Time: 07/28/22  1:32 PM  Result Value Ref Range   Alcohol, Ethyl (B) <10 <10 mg/dL    Comment: (NOTE) Lowest detectable limit for serum alcohol is 10  mg/dL.  For medical purposes only. Performed at [facility], [place] [address], [place], [place] [address]   Acetaminophen level     Status: Abnormal   Collection Time: 07/28/22  1:32 PM  Result Value Ref Range   Acetaminophen (Tylenol), Serum <10 (L) 10 - 30 ug/mL    Comment: (NOTE) Therapeutic concentrations vary significantly. A range of 10-30 ug/mL  may be an effective concentration for many patients. However, some  are best treated at concentrations outside of this range. Acetaminophen concentrations >150 ug/mL at 4 hours after ingestion  and >50 ug/mL at 12 hours after ingestion are often associated with  toxic reactions.  Performed at [facility], [place] [address], [place], [place] [address]   Salicylate level     Status: Abnormal   Collection Time: 07/28/22  1:32 PM  Result Value Ref Range   Salicylate Lvl <0.9 (L) 7.0 - 30.0 mg/dL    Comment: Performed at [facility] [address], [place], [place] [address]  I-Stat beta hCG blood, ED     Status: None   Collection Time: 07/28/22  1:41 PM  Result Value Ref Range   I-stat hCG, quantitative <5.0 <5 mIU/mL   Comment 3            Comment:   GEST. AGE      CONC.  (mIU/mL)   <=1 WEEK        5 - 50     2 WEEKS       50 - 500     3 WEEKS       100 - 10,000     4 WEEKS     1,000 - 30,000        FEMALE AND NON-PREGNANT FEMALE:     LESS THAN 5 mIU/mL   I-stat chem 8, ED     Status: Abnormal   Collection Time: 07/28/22  1:44 PM  Result Value Ref Range   Sodium 142 135 - 145 mmol/L   Potassium 3.8 3.5 - 5.1 mmol/L   Chloride 109 98 - 111 mmol/L   BUN 12 6 - 20 mg/dL   Creatinine, Ser 0.90  0.44 - 1.00 mg/dL   Glucose, Bld 118 (H) 70 - 99 mg/dL    Comment: Glucose reference range applies only to samples taken after fasting for at least 8 hours.   Calcium, Ion 1.13 (L) 1.15 - 1.40 mmol/L   TCO2 21 (L) 22 - 32 mmol/L   Hemoglobin 6.8 (LL) 12.0 - 15.0 g/dL   HCT 20.0 (L) 36.0 - 46.0 %   Comment NOTIFIED PHYSICIAN   Lactic acid, plasma     Status: None   Collection Time: 07/28/22  4:00 PM  Result Value Ref Range   Lactic Acid, Venous 1.3 0.5 - 1.9 mmol/L    Comment: Performed at [facility] [address] [place], [place] [address]  Type and screen [place]     Status: None (Preliminary result)   Collection Time: 07/28/22  4:00 PM  Result Value Ref Range   ABO/RH(D) PENDING    Antibody Screen PENDING    Sample Expiration      07/31/2022,2359 Performed at [facility], [place] [address] [place], [place] [address]   Prepare RBC (crossmatch)     Status: None   Collection Time: 07/28/22  4:00 PM  Result Value Ref Range   Order Confirmation      ORDER  PROCESSED BY BLOOD BANK Performed at [facility], [place] [address] [place], [place] [address]     DG Chest 2 View  Result Date: 07/28/2022 CLINICAL DATA:  Fever EXAM: CHEST - 2 VIEW COMPARISON:  Radiograph 11/29/2021 FINDINGS: Unchanged cardiomediastinal silhouette. There is no focal airspace consolidation. There is no pleural effusion or evidence of pneumothorax. There is no acute osseous abnormality. IMPRESSION: No evidence of acute cardiopulmonary disease. Electronically Signed   By: Maurine Simmering M.D.   On: 07/28/2022 14:03

## 2022-07-28 NOTE — H&P (Addendum)
[facility] History and Physical  Valerie Howard UJW:[id number] DOB: February 03, 1976 DOA: 07/28/2022   PCP: Patient, No Pcp Per  Specialists: None  Chief Complaint: Vaginal bleeding  HPI: Valerie Howard is a 46 y.o. female with a past medical history of uterine fibroids, menorrhagia, previous history of blood transfusion for the same issue, iron deficiency, history of schizophrenia and paranoid behavior who presented to the emergency department with complaints of heavy menstruation ongoing for the past few days.  The last time she had such heavy bleeding was several months ago.  She has had blood transfusion for low hemoglobin previously.  She denies any abdominal pain.  She has been feeling fatigued.  Has been weak but denies any syncopal episode.  No nausea or vomiting.  She does admit to having had a cough for the past day or so.  She has been coughing up sputum but does not know the color of the sputum.  Denies any shortness of breath per se.  In the emergency department she was noted to have a hemoglobin of 5.2.  Lactic acid level was normal.  WBC was normal.  COVID-19 test was negative.  Chest x-ray was unremarkable.  Urine pregnancy test was negative.  Blood transfusion was ordered.  Gyn has been consulted.  Home Medications: Prior to Admission medications   Medication Sig Start Date End Date Taking? Authorizing Provider  ferrous sulfate 325 (65 FE) MG tablet Take 1 tablet (325 mg total) by mouth daily. Patient not taking: Reported on 07/28/2022 10/11/21   Deno Etienne, DO  megestrol (MEGACE) 40 MG tablet Take 1/2 tablets (20 mg total) by mouth 2 (two) times daily. Patient not taking: Reported on 07/28/2022 10/12/21   Lucrezia Starch, MD    Allergies:  Allergies  Allergen Reactions   Penicillins Hives and Itching    Has patient had a PCN reaction causing immediate rash, facial/tongue/throat swelling, SOB or lightheadedness with hypotension:Patient refuses to answer (PRA) Has  patient had a PCN reaction causing severe rash involving mucus membranes or skin necrosis:PRA Has patient had a PCN reaction that required hospitalization PRA Has patient had a PCN reaction occurring within the last 10 years:PRA If all of the above answers are "NO", then may proceed with Cephalosporin use.     Past Medical History: Past Medical History:  Diagnosis Date   History of radiation therapy 07/18/17-07/24/17   right ear, operative bed 12 Gy in 3 fractions   Keloid    right ear   Paranoid behavior ([place])    Schizophrenia ([place])     Past Surgical History:  Procedure Laterality Date   CESAREAN SECTION     KENALOG INJECTION Bilateral 07/17/2017   Procedure: KENALOG INJECTION;  Surgeon: Wallace Going, DO;  Location: [facility];  Service: Plastics;  Laterality: Bilateral;   MASS EXCISION N/A 07/17/2017   Procedure: EXCISION KELOID OF RIGHT EAR WITH INTROPERATIVE KENALOG INJECTION AND POST OP RADIATION;  Surgeon: Wallace Going, DO;  Location: [facility];  Service: Plastics;  Laterality: N/A;   TUBAL LIGATION      Social History: Currently homeless.  Denies smoking alcohol use or illicit drug use.   Family History:  Family History  Problem Relation Age of Onset   Hypertension Other    Diabetes Other    Cancer Other    Keloids Mother    Breast cancer Maternal Grandmother      Review of Systems - History obtained from the patient General ROS:  positive for  - fatigue Psychological ROS: positive for - concentration difficulties Ophthalmic ROS: negative ENT ROS: negative Allergy and Immunology ROS: negative Hematological and Lymphatic ROS: negative Endocrine ROS: negative Respiratory ROS: As in HPI Cardiovascular ROS: no chest pain or dyspnea on exertion Gastrointestinal ROS: no abdominal pain, change in bowel habits, or black or bloody stools Genito-Urinary ROS: As in HPI Musculoskeletal ROS: negative Neurological ROS: no  TIA or stroke symptoms Dermatological ROS: negative  Physical Examination  Vitals:   07/28/22 1730 07/28/22 1731 07/28/22 1829 07/28/22 1844  BP: 119/83  117/76 (!) 128/100  Pulse: (!) 104  (!) 111 100  Resp: '16  16 18  '$ Temp:  99.4 F (37.4 C) 99.9 F (37.7 C) 99.7 F (37.6 C)  TempSrc:  Oral Oral Oral  SpO2: 97%  99% 99%    BP (!) 128/100   Pulse 100   Temp 99.7 F (37.6 C) (Oral)   Resp 18   LMP 07/25/2022   SpO2 99%   General appearance: alert, cooperative, appears stated age, and no distress Head: Normocephalic, without obvious abnormality, atraumatic Eyes: conjunctivae/corneas clear. PERRL, EOM's intact.  Throat: lips, mucosa, and tongue normal; teeth and gums normal Neck: no adenopathy, no carotid bruit, no JVD, supple, symmetrical, trachea midline, and thyroid not enlarged, symmetric, no tenderness/mass/nodules Resp: clear to auscultation bilaterally Cardio: regular rate and rhythm, S1, S2 normal, no murmur, click, rub or gallop GI: Abdomen is soft.  Nontender.  Fullness appreciated over the suprapubic area with ill-defined mass. Extremities: extremities normal, atraumatic, no cyanosis or edema Pulses: 2+ and symmetric Skin: Skin color, texture, turgor normal. No rashes or lesions Lymph nodes: Cervical, supraclavicular, and axillary nodes normal. Neurologic: Alert and oriented x 3.  No obvious focal neurological deficits.   Labs on Admission: I have personally reviewed following labs and imaging studies  CBC: Recent Labs  Lab 07/28/22 1300 07/28/22 1344  WBC 6.5  --   NEUTROABS 5.0  --   HGB 5.2* 6.8*  HCT 20.0* 20.0*  MCV 71.7*  --   PLT 265  --    Basic Metabolic Panel: Recent Labs  Lab 07/28/22 1300 07/28/22 1344  NA 140 142  K 4.0 3.8  CL 113* 109  CO2 18*  --   GLUCOSE 113* 118*  BUN 12 12  CREATININE 0.97 0.90  CALCIUM 8.4*  --    GFR: CrCl cannot be calculated (Unknown ideal weight.). Liver Function Tests: Recent Labs  Lab  07/28/22 1300  AST 20  ALT 9  ALKPHOS 53  BILITOT 0.5  PROT 6.7  ALBUMIN 3.5    Radiological Exams on Admission: DG Chest 2 View  Result Date: 07/28/2022 CLINICAL DATA:  Fever EXAM: CHEST - 2 VIEW COMPARISON:  Radiograph 11/29/2021 FINDINGS: Unchanged cardiomediastinal silhouette. There is no focal airspace consolidation. There is no pleural effusion or evidence of pneumothorax. There is no acute osseous abnormality. IMPRESSION: No evidence of acute cardiopulmonary disease. Electronically Signed   By: Maurine Simmering M.D.   On: 07/28/2022 14:03      Problem List  Principal Problem:   Acute blood loss anemia Active Problems:   Anemia, iron deficiency   Symptomatic anemia   Fibroids   Acute on chronic blood loss anemia   Assessment: This is a 46 year old female with past medical history as stated earlier who comes in with heavy menstrual period for the past few days.  Found to have low hemoglobin.  Will be admitted for blood transfusion.  She has  history of fibroids based on previous ultrasounds.  Apparently has not followed up with GYN.  Not interested in hysterectomy.  Reason for fever is not entirely clear.  She does have a cough so respiratory source is possible.  COVID-19 test was negative.  We will do a respiratory viral panel.  WBC is normal.  Chest x-ray is unremarkable.  Plan: #1. Acute on chronic blood loss anemia/iron deficiency: Previously found to have extremely low ferritin.  Current low hemoglobin is secondary to significant menorrhagia.  She was last evaluated in February when her hemoglobin was noted to be 6.5.  At discharge hemoglobin was 8.0.  No anemia panel was sent from the ED.  Will try to add on ferritin.  2 units of PRBC have been ordered.  She may benefit from iron infusion prior to discharge.  #2.  Menorrhagia likely due to uterine fibroids: Patient refused pelvic exam.  Refused pelvic ultrasound.  GYN was consulted.  Patient started on Megace.  Hopefully this  will help with her bleeding.  She will need definitive management although it looks like she has refused this in the past.  #3. Fever/cough: Chest x-ray without any acute findings.  Examination is unremarkable.  Saturations are normal.  WBC is normal.  COVID-19 test was negative.  Will do a respiratory viral panel.  UA is pending.  Hold off on antibacterials for now.  #4. History of schizophrenia/paranoid behavior: She is acting appropriately at this time.  Not agitated.  Answers all questions and has been taking medications given in the ED.  But she did refuse pelvic exam and ultrasound.  Refuses to be seen by psychiatry.  #5.  Mild hyperglycemia: Check fasting levels in the morning.  #6. Homelessness: TOC will be consulted.   DVT Prophylaxis: SCDs Code Status: Full code Family Communication: Discussed with patient Disposition: Patient mentions that she is homeless.  Will have TOC look into shelters. Consults called: GYN Admission Status: Status is: Observation The patient remains OBS appropriate and will d/c before 2 midnights.    Severity of Illness: The appropriate patient status for this patient is OBSERVATION. Observation status is judged to be reasonable and necessary in order to provide the required intensity of service to ensure the patient's safety. The patient's presenting symptoms, physical exam findings, and initial radiographic and laboratory data in the context of their medical condition is felt to place them at decreased risk for further clinical deterioration. Furthermore, it is anticipated that the patient will be medically stable for discharge from the hospital within 2 midnights of admission.    Further management decisions will depend on results of further testing and patient's response to treatment.   Raveen Wieseler [employer]  [facility] operational officer on [email or web address]  07/28/2022, 7:30 PM

## 2022-07-28 NOTE — ED Notes (Signed)
Verbal report given to [facility] RN at this time

## 2022-07-28 NOTE — ED Provider Notes (Addendum)
[facility] EMERGENCY DEPARTMENT Provider Note   CSN: [id number] Arrival date & time: 07/28/22  1235     History  Chief Complaint  Patient presents with   Menstrual Problem    Valerie Howard is a 46 y.o. female.  Patient brought in by EMS.  Patient says she has been having vaginal bleeding for 4 days became heavy today.  With clots.  Patient states she is feeling weak lightheaded not eating and has no housing.  Patient chart review shows that she has had dysfunctional uterine bleeding for some time.  Last transfused February 2023.  Patient seems not to follow-up with OB/GYN.  Chart review shows that patient frequently refuses exam.  And also does not want to discuss hysterectomy.  Patient also has past medical history of schizophrenia.  Patient denying any suicidal ideation denies any hallucinations but does seem to have disorder.  Patient in triage had a fever to 101.3.  Tachycardic 118 blood pressure 123/70 oxygen sats 98%.  Past medical history significant for schizophrenia paranoid behavior previous C-section tubal ligation patient is never used tobacco products.         Home Medications Prior to Admission medications   Medication Sig Start Date End Date Taking? Authorizing Provider  ferrous sulfate 325 (65 FE) MG tablet Take 1 tablet (325 mg total) by mouth daily. 10/11/21   Deno Etienne, DO  megestrol (MEGACE) 40 MG tablet Take 1/2 tablets (20 mg total) by mouth 2 (two) times daily. 10/12/21   Lucrezia Starch, MD      Allergies    Penicillins    Review of Systems   Review of Systems  Constitutional:  Positive for fatigue. Negative for chills and fever.  HENT:  Negative for ear pain and sore throat.   Eyes:  Negative for pain and visual disturbance.  Respiratory:  Negative for cough and shortness of breath.   Cardiovascular:  Negative for chest pain and palpitations.  Gastrointestinal:  Negative for abdominal pain and vomiting.  Genitourinary:   Positive for vaginal bleeding. Negative for dysuria and hematuria.  Musculoskeletal:  Negative for arthralgias and back pain.  Skin:  Negative for color change and rash.  Neurological:  Positive for light-headedness. Negative for seizures and syncope.  Psychiatric/Behavioral:  Negative for suicidal ideas.   All other systems reviewed and are negative.   Physical Exam Updated Vital Signs BP 117/62   Pulse 100   Temp (!) 101.3 F (38.5 C) (Oral)   Resp 14   LMP 07/25/2022   SpO2 100%  Physical Exam Vitals and nursing note reviewed.  Constitutional:      General: She is not in acute distress.    Appearance: Normal appearance. She is well-developed.  HENT:     Head: Normocephalic and atraumatic.     Mouth/Throat:     Mouth: Mucous membranes are moist.  Eyes:     Extraocular Movements: Extraocular movements intact.     Conjunctiva/sclera: Conjunctivae normal.     Pupils: Pupils are equal, round, and reactive to light.  Cardiovascular:     Rate and Rhythm: Regular rhythm. Tachycardia present.     Heart sounds: No murmur heard. Pulmonary:     Effort: Pulmonary effort is normal. No respiratory distress.     Breath sounds: Normal breath sounds.  Abdominal:     Palpations: Abdomen is soft.     Tenderness: There is no abdominal tenderness.  Genitourinary:    Comments: Patient refusing GYN exam. Musculoskeletal:  General: No swelling.     Cervical back: Normal range of motion and neck supple.  Skin:    General: Skin is warm and dry.     Capillary Refill: Capillary refill takes less than 2 seconds.  Neurological:     General: No focal deficit present.     Mental Status: She is alert.     Comments: Patient without any significant focal deficits.  Denies any suicidal ideation denies any hallucinations.  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  Patient has a history of schizophrenia.  Psychiatric:        Mood and Affect: Mood normal.     ED Results / Procedures  / Treatments   Labs (all labs ordered are listed, but only abnormal results are displayed) Labs Reviewed  COMPREHENSIVE METABOLIC PANEL - Abnormal; Notable for the following components:      Result Value   Chloride 113 (*)    CO2 18 (*)    Glucose, Bld 113 (*)    Calcium 8.4 (*)    All other components within normal limits  CBC WITH DIFFERENTIAL/PLATELET - Abnormal; Notable for the following components:   RBC 2.79 (*)    Hemoglobin 5.2 (*)    HCT 20.0 (*)    MCV 71.7 (*)    MCH 18.6 (*)    MCHC 26.0 (*)    RDW 20.2 (*)    All other components within normal limits  ACETAMINOPHEN LEVEL - Abnormal; Notable for the following components:   Acetaminophen (Tylenol), Serum <10 (*)    All other components within normal limits  SALICYLATE LEVEL - Abnormal; Notable for the following components:   Salicylate Lvl <8.2 (*)    All other components within normal limits  I-STAT CHEM 8, ED - Abnormal; Notable for the following components:   Glucose, Bld 118 (*)    Calcium, Ion 1.13 (*)    TCO2 21 (*)    Hemoglobin 6.8 (*)    HCT 20.0 (*)    All other components within normal limits  SARS CORONAVIRUS 2 BY RT PCR  ETHANOL  URINALYSIS, ROUTINE W REFLEX MICROSCOPIC  RAPID URINE DRUG SCREEN, HOSP PERFORMED  LACTIC ACID, PLASMA  LACTIC ACID, PLASMA  I-STAT BETA HCG BLOOD, ED ([facility], [facility], [facility] ONLY)  TYPE AND SCREEN  PREPARE RBC (CROSSMATCH)    EKG None  Radiology DG Chest 2 View  Result Date: 07/28/2022 CLINICAL DATA:  Fever EXAM: CHEST - 2 VIEW COMPARISON:  Radiograph 11/29/2021 FINDINGS: Unchanged cardiomediastinal silhouette. There is no focal airspace consolidation. There is no pleural effusion or evidence of pneumothorax. There is no acute osseous abnormality. IMPRESSION: No evidence of acute cardiopulmonary disease. Electronically Signed   By: Maurine Simmering M.D.   On: 07/28/2022 14:03    Procedures Procedures    Medications Ordered in ED Medications  0.9 %  sodium chloride infusion  (has no administration in time range)  0.9 %  sodium chloride infusion (has no administration in time range)  acetaminophen (TYLENOL) tablet 1,000 mg (1,000 mg Oral Given 07/28/22 1307)    ED Course/ Medical Decision Making/ A&P                           Medical Decision Making Amount and/or Complexity of Data Reviewed Labs: ordered.  Risk OTC drugs. Prescription drug management. Decision regarding hospitalization.   With a long history of dysfunctional vaginal bleeding.  Known to have uterine fibroids.  Confronted  patient about that she is do not talk to me about that that would not solve the problem underwent here about hysterectomy.  Patient also refused GYN exam.  Complete metabolic panel significant for CO2 of 18 glucose is at 113 renal function normal.  LFTs normal potassium normal CBC significant for white count of 6.5 hemoglobin 5.2 platelets 265.  Patient has significant anemia certainly from the dysfunctional vaginal bleeding.  Will need blood transfusions.  Patient has antibodies due to many transfusions in the past.  Will need admission for this.  Last transfused October 11, 2021.  Patient seems to refuse to follow-up with GYN.  In addition patient is symptomatic and that she is tachycardic and says that she has generalized weakness lightheadedness.  Pregnancy test negative.  Discussed with on-call GYN service will probably need medicine admission.  Will need social worker case management to be involved and probably needs a psychiatric consult as well.   In addition based on the temp of 101.3 patient without a leukocytosis urinalysis still pending COVID influenza is pending.  Chest x-ray no evidence of acute cardiopulmonary disease.  COVID testing was done and it is negative.  Influenza testing not done.  But patient does not seem to have symptoms of upper respiratory infection.  Urinalysis still pending.  We will have them repeat the temperature does not seem to fit  clinically.  Patient denies any abdominal pain.  Patient psychiatric medication screen without any concerns lactic acid is in process.  Urine drug screen still pending.  CRITICAL CARE Performed by: Fredia Sorrow Total critical care time: 45 minutes Critical care time was exclusive of separately billable procedures and treating other patients. Critical care was necessary to treat or prevent imminent or life-threatening deterioration. Critical care was time spent personally by me on the following activities: development of treatment plan with patient and/or surrogate as well as nursing, discussions with consultants, evaluation of patient's response to treatment, examination of patient, obtaining history from patient or surrogate, ordering and performing treatments and interventions, ordering and review of laboratory studies, ordering and review of radiographic studies, pulse oximetry and re-evaluation of patient's condition.   Final Clinical Impression(s) / ED Diagnoses Final diagnoses:  Vaginal bleeding  Anemia, unspecified type  Dysfunctional uterine bleeding    Rx / DC Orders ED Discharge Orders     None         Fredia Sorrow, MD 07/28/22 1645    Fredia Sorrow, MD 07/28/22 1651
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
## 2022-07-28 NOTE — ED Notes (Signed)
Benedetto Goad PA, shown results of [employer]. ED-Lab.

## 2022-07-28 NOTE — ED Notes (Signed)
Received verbal report from [place] at this time
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
## 2022-07-29 DIAGNOSIS — Z20822 Contact with and (suspected) exposure to covid-19: Secondary | ICD-10-CM | POA: Diagnosis present

## 2022-07-29 DIAGNOSIS — Z9851 Tubal ligation status: Secondary | ICD-10-CM | POA: Diagnosis not present

## 2022-07-29 DIAGNOSIS — J101 Influenza due to other identified influenza virus with other respiratory manifestations: Secondary | ICD-10-CM | POA: Diagnosis present

## 2022-07-29 DIAGNOSIS — Z79899 Other long term (current) drug therapy: Secondary | ICD-10-CM | POA: Diagnosis not present

## 2022-07-29 DIAGNOSIS — D62 Acute posthemorrhagic anemia: Secondary | ICD-10-CM | POA: Diagnosis present

## 2022-07-29 DIAGNOSIS — R454 Irritability and anger: Secondary | ICD-10-CM | POA: Diagnosis not present

## 2022-07-29 DIAGNOSIS — N921 Excessive and frequent menstruation with irregular cycle: Secondary | ICD-10-CM | POA: Diagnosis not present

## 2022-07-29 DIAGNOSIS — Z880 Allergy status to penicillin: Secondary | ICD-10-CM | POA: Diagnosis not present

## 2022-07-29 DIAGNOSIS — R42 Dizziness and giddiness: Secondary | ICD-10-CM | POA: Diagnosis present

## 2022-07-29 DIAGNOSIS — N939 Abnormal uterine and vaginal bleeding, unspecified: Secondary | ICD-10-CM | POA: Diagnosis not present

## 2022-07-29 DIAGNOSIS — N920 Excessive and frequent menstruation with regular cycle: Secondary | ICD-10-CM | POA: Diagnosis present

## 2022-07-29 DIAGNOSIS — E538 Deficiency of other specified B group vitamins: Secondary | ICD-10-CM | POA: Diagnosis present

## 2022-07-29 DIAGNOSIS — N938 Other specified abnormal uterine and vaginal bleeding: Secondary | ICD-10-CM | POA: Diagnosis present

## 2022-07-29 DIAGNOSIS — D259 Leiomyoma of uterus, unspecified: Secondary | ICD-10-CM | POA: Diagnosis present

## 2022-07-29 DIAGNOSIS — Z5900 Homelessness unspecified: Secondary | ICD-10-CM | POA: Diagnosis not present

## 2022-07-29 LAB — PREPARE RBC (CROSSMATCH)

## 2022-07-29 LAB — COMPREHENSIVE METABOLIC PANEL
ALT: 7 U/L (ref 0–44)
AST: 14 U/L — ABNORMAL LOW (ref 15–41)
Albumin: 3.1 g/dL — ABNORMAL LOW (ref 3.5–5.0)
Alkaline Phosphatase: 44 U/L (ref 38–126)
Anion gap: 5 (ref 5–15)
BUN: 10 mg/dL (ref 6–20)
CO2: 24 mmol/L (ref 22–32)
Calcium: 8.1 mg/dL — ABNORMAL LOW (ref 8.9–10.3)
Chloride: 109 mmol/L (ref 98–111)
Creatinine, Ser: 0.94 mg/dL (ref 0.44–1.00)
GFR, Estimated: >60 mL/min (ref >60)
Glucose, Bld: 96 mg/dL (ref 70–99)
Potassium: 3.6 mmol/L (ref 3.5–5.1)
Sodium: 138 mmol/L (ref 135–145)
Total Bilirubin: 1.3 mg/dL — ABNORMAL HIGH (ref 0.3–1.2)
Total Protein: 6.2 g/dL — ABNORMAL LOW (ref 6.5–8.1)

## 2022-07-29 LAB — CBC
HCT: 23.7 % — ABNORMAL LOW (ref 36.0–46.0)
Hemoglobin: 6.8 g/dL — CL (ref 12.0–15.0)
MCH: 20.5 pg — ABNORMAL LOW (ref 26.0–34.0)
MCHC: 28.7 g/dL — ABNORMAL LOW (ref 30.0–36.0)
MCV: 71.6 fL — ABNORMAL LOW (ref 80.0–100.0)
Platelets: 228 10*3/uL (ref 150–400)
RBC: 3.31 MIL/uL — ABNORMAL LOW (ref 3.87–5.11)
RDW: 20.2 % — ABNORMAL HIGH (ref 11.5–15.5)
WBC: 5.8 10*3/uL (ref 4.0–10.5)
nRBC: 0.3 % — ABNORMAL HIGH (ref 0.0–0.2)

## 2022-07-29 LAB — HEMOGLOBIN AND HEMATOCRIT, BLOOD
HCT: 27.5 % — ABNORMAL LOW (ref 36.0–46.0)
Hemoglobin: 8.4 g/dL — ABNORMAL LOW (ref 12.0–15.0)

## 2022-07-29 LAB — FERRITIN: Ferritin: 5 ng/mL — ABNORMAL LOW (ref 11–307)

## 2022-07-29 LAB — LACTIC ACID, PLASMA: Lactic Acid, Venous: 1 mmol/L (ref 0.5–1.9)

## 2022-07-29 LAB — HIV ANTIBODY (ROUTINE TESTING W REFLEX): HIV Screen 4th Generation wRfx: NONREACTIVE

## 2022-07-29 MED ORDER — POLYSACCHARIDE IRON COMPLEX 150 MG PO CAPS
150.0000 mg | ORAL_CAPSULE | Freq: Every day | ORAL | Status: DC
  Filled 2022-07-29 (×7): qty 1

## 2022-07-29 MED ORDER — DIPHENHYDRAMINE HCL 25 MG PO CAPS
25.0000 mg | ORAL_CAPSULE | Freq: Once | ORAL | Status: AC
  Administered 2022-07-29: 25 mg via ORAL
  Filled 2022-07-29: qty 1

## 2022-07-29 MED ORDER — ORAL CARE MOUTH RINSE: 15.0000 mL | OROMUCOSAL | Status: DC | PRN

## 2022-07-29 MED ORDER — DM-GUAIFENESIN ER 30-600 MG PO TB12
1.0000 | ORAL_TABLET | Freq: Two times a day (BID) | ORAL | Status: DC
  Administered 2022-07-29 – 2022-08-04 (×9): 1 via ORAL
  Filled 2022-07-29 (×12): qty 1

## 2022-07-29 MED ORDER — ACETAMINOPHEN 325 MG PO TABS
650.0000 mg | ORAL_TABLET | Freq: Once | ORAL | Status: AC
  Administered 2022-07-29: 650 mg via ORAL

## 2022-07-29 MED ORDER — GUAIFENESIN 100 MG/5ML PO LIQD
5.0000 mL | ORAL | Status: DC | PRN
  Administered 2022-07-29 – 2022-08-01 (×8): 5 mL via ORAL
  Filled 2022-07-29 (×8): qty 15

## 2022-07-29 MED ORDER — SODIUM CHLORIDE 0.9% IV SOLUTION: Freq: Once | INTRAVENOUS | Status: AC

## 2022-07-29 NOTE — Hospital Course (Addendum)
46 year old woman PMH including uterine fibroids, menorrhagia, schizophrenia, paranoid behavior presenting with heavy vaginal bleeding.  Admitted, treated with blood transfusions, Megace and seen by OB/GYN with recommendation for same and outpatient follow-up.  Also found to have the flu.  Seen by psychiatry, no acute inpatient needs.

## 2022-07-29 NOTE — Progress Notes (Signed)
PROGRESS NOTE Valerie Howard  IRW:[id number] DOB: 07/14/1976 DOA: 07/28/2022 PCP: Patient, No Pcp Per   Brief Narrative/Hospital Course:  46 y.o. female with a past medical history of uterine fibroids, menorrhagia, previous history of blood transfusion for the same issue, iron deficiency, history of schizophrenia and paranoid behavior who presented to the emergency department with complaints of heavy menstruation ongoing for the past few days.  The last time she had such heavy bleeding was several months ago.  She has had blood transfusion for low hemoglobin previously.  She denies any abdominal pain.  She has been feeling fatigued.  Has been weak but denies any syncopal episode.  No nausea or vomiting.  She does admit to having had a cough for the past day or so.  She has been coughing up sputum but does not know the color of the sputum.  Denies any shortness of breath per se. In the emergency department she was noted to have a hemoglobin of 5.2.  Lactic acid level was normal.  WBC was normal.  COVID-19 test was negative.  Chest x-ray was unremarkable.  Urine pregnancy test was negative.  Blood transfusion was ordered.  Gyn has been consulted      Subjective: Seen and examined this morning. Resting comfortably having her meal Complaints of cough Reports its time she was coughing she was having vaginal bleeding and reports Megace helped her and not bleeding Is alert oriented x3.  Assessment and Plan:  Acute on chronic blood loss anemia Iron deficiency anemia: Due to her menorrhagia with fibroids.  Received 2 units PRBC hemoglobin is still less than 7 g- she agrees for 1 more unit transfusion. Add iron supplementation. She has had  iron transfusion in the past Recent Labs  Lab 07/28/22 1300 07/28/22 1344 07/29/22 0549  HGB 5.2* 6.8* 6.8*  HCT 20.0* 20.0* 23.7*    Menorrhagia likely due to uterine fibroids chronic history: In the ED refused ultrasound and pelvic exam.  Reports Megace is  helping her.  GYN was consulted   Transient fever on 101.3 in the ED: Mild cough but chest x-ray no acute finding no other complaint COVID-19 negative, however panel pending.  UA pending, no recurrence of fever so hold off on antibiotics continue antitussives   Hx of schizophrenia/paranoid behavior: Currently alert awake oriented x3 acting appropriately denies homicidal suicidal ideation.  Reports she is homeless.  She is currently calm, Cooperative.  Of note she refused pelvic exam, refused to be seen by psychiatry.  Will benefit with outpatient follow-up   Mild hyperglycemia: Fasting blood sugar normal 96 this morning.  Homelessness: TOC consulted.  DVT prophylaxis: SCDs Start: 07/28/22 1932 Code Status:   Code Status: Full Code Family Communication: plan of care discussed with patient at bedside. Patient status is: Admitted as observation but remains hospitalized for ongoing blood transfusion because of  Level of care: Med-Surg  Dispo: The patient is from: homeless            Anticipated disposition: TBD Objective: Vitals last 24 hrs: Vitals:   07/28/22 2335 07/28/22 2349 07/29/22 0215 07/29/22 0431  BP: (!) 126/55 119/61 121/61 118/87  Pulse: 97 97 99 86  Resp: '18 18 18 18  '$ Temp: 100 F (37.8 C) 99.6 F (37.6 C) 98.9 F (37.2 C) 99.3 F (37.4 C)  TempSrc: Oral  Oral Oral  SpO2: 99%  99% 100%   Weight change:   Physical Examination: General exam: alert awake, older than stated age HEENT:Oral mucosa moist, Ear/Nose  WNL grossly Respiratory system: bilaterally CLEAR BS, no use of accessory muscle Cardiovascular system: S1 & S2 +, No JVD. Gastrointestinal system: Abdomen soft,NT,ND, BS+ Nervous System:Alert, awake, moving extremities. Extremities: LE edema NEG,distal peripheral pulses palpable.  Skin: No rashes,no icterus. MSK: Normal muscle bulk,tone, power  Medications reviewed:  Scheduled Meds:  megestrol  80 mg Oral Daily   multivitamin with minerals  1 tablet Oral  Daily   thiamine  100 mg Oral Daily   Continuous Infusions:  sodium chloride      Diet Order             Diet regular Room service appropriate? Yes; Fluid consistency: Thin  Diet effective now                   Intake/Output Summary (Last 24 hours) at 07/29/2022 0815 Last data filed at 07/29/2022 0432 Gross per 24 hour  Intake 650.42 ml  Output --  Net 650.42 ml   Net IO Since Admission: 650.42 mL [07/29/22 0815]  Wt Readings from Last 3 Encounters:  09/02/21 81.6 kg  04/01/21 86.5 kg  01/22/21 74.8 kg     Unresulted Labs (From admission, onward)     Start     Ordered   07/29/22 0500  HIV Antibody (routine testing w rflx)  (HIV Antibody (Routine testing w reflex) panel)  Tomorrow morning,   R        07/28/22 1933   07/28/22 1933  Respiratory (~20 pathogens) panel by PCR  (Respiratory panel by PCR (~20 pathogens, ~24 hr TAT)  w precautions)  Once,   R        07/28/22 1933   07/28/22 1835  Respiratory (~20 pathogens) panel by PCR  (Respiratory panel by PCR (~20 pathogens, ~24 hr TAT)  w precautions)  Once,   URGENT        07/28/22 1836   07/28/22 1331  Urinalysis, Routine w reflex microscopic  Once,   URGENT        07/28/22 1332   07/28/22 1331  Urine rapid drug screen (hosp performed)  Once,   STAT        07/28/22 1332          Data Reviewed: I have personally reviewed following labs and imaging studies CBC: Recent Labs  Lab 07/28/22 1300 07/28/22 1344 07/29/22 0549  WBC 6.5  --  5.8  NEUTROABS 5.0  --   --   HGB 5.2* 6.8* 6.8*  HCT 20.0* 20.0* 23.7*  MCV 71.7*  --  71.6*  PLT 265  --  456   Basic Metabolic Panel: Recent Labs  Lab 07/28/22 1300 07/28/22 1344 07/29/22 0549  NA 140 142 138  K 4.0 3.8 3.6  CL 113* 109 109  CO2 18*  --  24  GLUCOSE 113* 118* 96  BUN '12 12 10  '$ CREATININE 0.97 0.90 0.94  CALCIUM 8.4*  --  8.1*   GFR: CrCl cannot be calculated (Unknown ideal weight.). Liver Function Tests: Recent Labs  Lab 07/28/22 1300  07/29/22 0549  AST 20 14*  ALT 9 7  ALKPHOS 53 44  BILITOT 0.5 1.3*  PROT 6.7 6.2*  ALBUMIN 3.5 3.1*   Recent Results (from the past 240 hour(s))  SARS Coronavirus 2 by RT PCR (hospital order, performed in [facility] hospital lab) *cepheid single result test* Anterior Nasal Swab     Status: None   Collection Time: 07/28/22  1:04 PM   Specimen: Anterior Nasal Swab  Result Value Ref Range Status   SARS Coronavirus 2 by RT PCR NEGATIVE NEGATIVE Final    Comment: (NOTE) SARS-CoV-2 target nucleic acids are NOT DETECTED.  The SARS-CoV-2 RNA is generally detectable in upper and lower respiratory specimens during the acute phase of infection. The lowest concentration of SARS-CoV-2 viral copies this assay can detect is 250 copies / mL. A negative result does not preclude SARS-CoV-2 infection and should not be used as the sole basis for treatment or other patient management decisions.  A negative result may occur with improper specimen collection / handling, submission of specimen other than nasopharyngeal swab, presence of viral mutation(s) within the areas targeted by this assay, and inadequate number of viral copies (<250 copies / mL). A negative result must be combined with clinical observations, patient history, and epidemiological information.  Fact Sheet for Patients:   https://www.patel.info/  Fact Sheet for Healthcare Providers: https://hall.com/  This test is not yet approved or  cleared by the [place] FDA and has been authorized for detection and/or diagnosis of SARS-CoV-2 by FDA under an Emergency Use Authorization (EUA).  This EUA will remain in effect (meaning this test can be used) for the duration of the COVID-19 declaration under Section 564(b)(1) of the Act, 21 U.S.C. section 360bbb-3(b)(1), unless the authorization is terminated or revoked sooner.  Performed at [facility], [place] [address] [place],  [place] [address]     Antimicrobials: Anti-infectives (From admission, onward)    None      Culture/Microbiology No results found for: "SDES", "SPECREQUEST", "CULT", "REPTSTATUS"  Radiology Studies: DG Chest 2 View  Result Date: 07/28/2022 CLINICAL DATA:  Fever EXAM: CHEST - 2 VIEW COMPARISON:  Radiograph 11/29/2021 FINDINGS: Unchanged cardiomediastinal silhouette. There is no focal airspace consolidation. There is no pleural effusion or evidence of pneumothorax. There is no acute osseous abnormality. IMPRESSION: No evidence of acute cardiopulmonary disease. Electronically Signed   By: Maurine Simmering M.D.   On: 07/28/2022 14:03     LOS: 0 days   Antonieta Pert, MD [facility]  07/29/2022, 8:15 AM

## 2022-07-30 ENCOUNTER — Inpatient Hospital Stay (HOSPITAL_COMMUNITY): Payer: Medicaid Other

## 2022-07-30 DIAGNOSIS — D62 Acute posthemorrhagic anemia: Secondary | ICD-10-CM | POA: Diagnosis not present

## 2022-07-30 LAB — RAPID URINE DRUG SCREEN, HOSP PERFORMED
Amphetamines: NOT DETECTED
Barbiturates: NOT DETECTED
Benzodiazepines: NOT DETECTED
Cocaine: NOT DETECTED
Opiates: NOT DETECTED
Tetrahydrocannabinol: NOT DETECTED

## 2022-07-30 LAB — TYPE AND SCREEN
ABO/RH(D): O POS
Antibody Screen: NEGATIVE
Donor AG Type: NEGATIVE
Donor AG Type: NEGATIVE
Donor AG Type: NEGATIVE
Unit division: 0
Unit division: 0
Unit division: 0

## 2022-07-30 LAB — CBC
HCT: 27.2 % — ABNORMAL LOW (ref 36.0–46.0)
Hemoglobin: 8.4 g/dL — ABNORMAL LOW (ref 12.0–15.0)
MCH: 22.5 pg — ABNORMAL LOW (ref 26.0–34.0)
MCHC: 30.9 g/dL (ref 30.0–36.0)
MCV: 72.7 fL — ABNORMAL LOW (ref 80.0–100.0)
Platelets: 266 10*3/uL (ref 150–400)
RBC: 3.74 MIL/uL — ABNORMAL LOW (ref 3.87–5.11)
RDW: 20.9 % — ABNORMAL HIGH (ref 11.5–15.5)
WBC: 7.2 10*3/uL (ref 4.0–10.5)
nRBC: 1 % — ABNORMAL HIGH (ref 0.0–0.2)

## 2022-07-30 LAB — RESPIRATORY PANEL BY PCR

## 2022-07-30 LAB — URINALYSIS, ROUTINE W REFLEX MICROSCOPIC
Bacteria, UA: NONE SEEN
Bilirubin Urine: NEGATIVE
Glucose, UA: NEGATIVE mg/dL
Ketones, ur: NEGATIVE mg/dL
Leukocytes,Ua: NEGATIVE
Nitrite: NEGATIVE
Protein, ur: NEGATIVE mg/dL
RBC / HPF: >50 RBC/hpf — ABNORMAL HIGH (ref 0–5)
Specific Gravity, Urine: 1.018 (ref 1.005–1.030)
pH: 5 (ref 5.0–8.0)

## 2022-07-30 LAB — BASIC METABOLIC PANEL
Anion gap: 8 (ref 5–15)
BUN: 7 mg/dL (ref 6–20)
CO2: 24 mmol/L (ref 22–32)
Calcium: 8.8 mg/dL — ABNORMAL LOW (ref 8.9–10.3)
Chloride: 108 mmol/L (ref 98–111)
Creatinine, Ser: 0.9 mg/dL (ref 0.44–1.00)
GFR, Estimated: >60 mL/min (ref >60)
Glucose, Bld: 93 mg/dL (ref 70–99)
Potassium: 3.7 mmol/L (ref 3.5–5.1)
Sodium: 140 mmol/L (ref 135–145)

## 2022-07-30 LAB — BPAM RBC
Blood Product Expiration Date: 202312112359
Blood Product Expiration Date: 202312182359
Blood Product Expiration Date: 202312242359
ISSUE DATE / TIME: 202311301817
ISSUE DATE / TIME: 202311302327
ISSUE DATE / TIME: 202312011005
Unit Type and Rh: 5100
Unit Type and Rh: 5100
Unit Type and Rh: 5100

## 2022-07-30 LAB — VITAMIN B12: Vitamin B-12: 108 pg/mL — ABNORMAL LOW (ref 180–914)

## 2022-07-30 LAB — FOLATE: Folate: 16.1 ng/mL (ref >5.9)

## 2022-07-30 MED ORDER — CYANOCOBALAMIN 1000 MCG/ML IJ SOLN
1000.0000 ug | Freq: Every day | INTRAMUSCULAR | Status: DC
  Filled 2022-07-30 (×2): qty 1

## 2022-07-30 MED ORDER — OSELTAMIVIR PHOSPHATE 75 MG PO CAPS
75.0000 mg | ORAL_CAPSULE | Freq: Two times a day (BID) | ORAL | Status: AC
  Administered 2022-07-30 – 2022-08-03 (×8): 75 mg via ORAL
  Filled 2022-07-30 (×10): qty 1

## 2022-07-30 MED ORDER — AZITHROMYCIN 500 MG PO TABS
500.0000 mg | ORAL_TABLET | Freq: Every day | ORAL | Status: DC
  Filled 2022-07-30: qty 1

## 2022-07-30 MED ORDER — LORATADINE 10 MG PO TABS
10.0000 mg | ORAL_TABLET | Freq: Every day | ORAL | Status: DC
  Administered 2022-07-30: 10 mg via ORAL
  Filled 2022-07-30 (×4): qty 1

## 2022-07-30 NOTE — Progress Notes (Addendum)
PROGRESS NOTE Valerie Howard  YYT:[id number] DOB: [birth year] DOA: 07/28/2022 PCP: Patient, No Pcp Per   Brief Narrative/Hospital Course:  46 y.o. female with a past medical history of uterine fibroids, menorrhagia, previous history of blood transfusion for the same issue, iron deficiency, history of schizophrenia and paranoid behavior who presented to the emergency department with complaints of heavy menstruation ongoing for the past few days.  The last time she had such heavy bleeding was several months ago.  She has had blood transfusion for low hemoglobin previously.  She denies any abdominal pain.  She has been feeling fatigued.  Has been weak but denies any syncopal episode.  No nausea or vomiting.  She does admit to having had a cough for the past day or so.  She has been coughing up sputum but does not know the color of the sputum.  Denies any shortness of breath per se. In the emergency department she was noted to have a hemoglobin of 5.2.  Lactic acid level was normal.  WBC was normal.  COVID-19 test was negative.  Chest x-ray was unremarkable.  Urine pregnancy test was negative.  Blood transfusion was ordered.  Gyn has been consulted    Subjective: Seen and examined this morning Patient reports she is having some cough congestion midsternal chest pain this morning not feeling well Mild low-grade fever 100.8 at 11 PM last night  Reports she is only having spotting no more heavy vaginal bleeding Denies shortness of breath nausea vomiting  Assessment and Plan:  Acute on chronic blood loss anemia Iron deficiency anemia: Due to her menorrhagia with fibroids.Received 3 units PRBC hemoglobin is appropriately increased and stable at 8.4 g.  Continue iron supplementation.She has had  iron transfusion in the past-ferritin is severely low at 5 check folate and B12.  Hold off iron transfusion given her fever Recent Labs  Lab 07/28/22 1300 07/28/22 1344 07/29/22 0549 07/29/22 1607  07/30/22 0319  HGB 5.2* 6.8* 6.8* 8.4* 8.4*  HCT 20.0* 20.0* 23.7* 27.5* 27.2*   Menorrhagia likely due to uterine fibroids chronic history: In the ED refused ultrasound and pelvic exam.  Reports her heavy bleeding has resolved.continue Megace 80 mg daily appreciate GYN inputs   Severe B12 deficiency at 108 started on high-dose replacement, continue folic acid.  Low-grade fever influenza A: in ED feever on 101.3 , again low-grade fever last night, complains of mild cough, chest x-ray no acute finding> AP chest x-ray unremarkable, respiratory virus panel came back positive for influenza A starting Tamiflu if she is agreeable,   Hx of schizophrenia/paranoid behavior: Patient currently is alert awake oriented x3 acting appropriately denies suicidal homicidal ideation.Reports she is homeless.  She is currently calm, Cooperative. Of note she refused pelvic exam, refused to be seen by psychiatry.  Will benefit with outpatient follow-up.    Mild hyperglycemia: Fasting blood sugar stable  Homelessness: TOC consulted.  DVT prophylaxis: SCDs Start: 07/28/22 1932 Code Status:   Code Status: Full Code Family Communication: plan of care discussed with patient at bedside. Patient status is: Inpatient due to acute blood loss anemia   Level of care: Med-Surg  Dispo: The patient is from: homeless            Anticipated disposition: TBD.  TOC consulted for disposition Objective: Vitals last 24 hrs: Vitals:   07/29/22 1623 07/29/22 1927 07/29/22 2352 07/30/22 0624  BP: (!) 140/74 131/72 124/70 121/68  Pulse: 91 66 93 88  Resp: '18 16 15 18  '$ Temp:  98.5 F (36.9 C) 98.6 F (37 C) (!) 100.8 F (38.2 C) 99.3 F (37.4 C)  TempSrc: Oral  Oral Oral  SpO2: 100% (!) 74% 98% 100%   Weight change:   Physical Examination: General exam: AAOX3, weak,older appearing HEENT:Oral mucosa moist, Ear/Nose WNL grossly, dentition normal. Respiratory system: bilaterally clear BS,no use of accessory  muscle Cardiovascular system: S1 & S2 +, regular rat. Gastrointestinal system: Abdomen soft,NT,ND,BS+ Nervous System:Alert, awake, moving extremities and grossly nonfocal Extremities: LE ankle edema neg, lower extremities warm Skin: No rashes,no icterus. MSK: Normal muscle bulk,tone, power   Medications reviewed:  Scheduled Meds:  dextromethorphan-guaiFENesin  1 tablet Oral BID   iron polysaccharides  150 mg Oral Daily   megestrol  80 mg Oral Daily   multivitamin with minerals  1 tablet Oral Daily   thiamine  100 mg Oral Daily   Continuous Infusions:  sodium chloride      Diet Order             Diet regular Room service appropriate? Yes; Fluid consistency: Thin  Diet effective now                   Intake/Output Summary (Last 24 hours) at 07/30/2022 0724 Last data filed at 07/29/2022 1404 Gross per 24 hour  Intake 555 ml  Output --  Net 555 ml    Net IO Since Admission: 1,205.42 mL [07/30/22 0724]  Wt Readings from Last 3 Encounters:  09/02/21 81.6 kg  04/01/21 86.5 kg  01/22/21 74.8 kg     Unresulted Labs (From admission, onward)     Start     Ordered   07/28/22 1933  Respiratory (~20 pathogens) panel by PCR  (Respiratory panel by PCR (~20 pathogens, ~24 hr TAT)  w precautions)  Once,   R        07/28/22 1933   07/28/22 1835  Respiratory (~20 pathogens) panel by PCR  (Respiratory panel by PCR (~20 pathogens, ~24 hr TAT)  w precautions)  Once,   URGENT        07/28/22 1836   07/28/22 1331  Urinalysis, Routine w reflex microscopic  Once,   URGENT        07/28/22 1332   07/28/22 1331  Urine rapid drug screen (hosp performed)  Once,   STAT        07/28/22 1332          Data Reviewed: I have personally reviewed following labs and imaging studies CBC: Recent Labs  Lab 07/28/22 1300 07/28/22 1344 07/29/22 0549 07/29/22 1607 07/30/22 0319  WBC 6.5  --  5.8  --  7.2  NEUTROABS 5.0  --   --   --   --   HGB 5.2* 6.8* 6.8* 8.4* 8.4*  HCT 20.0* 20.0* 23.7*  27.5* 27.2*  MCV 71.7*  --  71.6*  --  72.7*  PLT 265  --  228  --  741    Basic Metabolic Panel: Recent Labs  Lab 07/28/22 1300 07/28/22 1344 07/29/22 0549 07/30/22 0319  NA 140 142 138 140  K 4.0 3.8 3.6 3.7  CL 113* 109 109 108  CO2 18*  --  24 24  GLUCOSE 113* 118* 96 93  BUN '12 12 10 7  '$ CREATININE 0.97 0.90 0.94 0.90  CALCIUM 8.4*  --  8.1* 8.8*    GFR: CrCl cannot be calculated (Unknown ideal weight.). Liver Function Tests: Recent Labs  Lab 07/28/22 1300 07/29/22 0549  AST 20  14*  ALT 9 7  ALKPHOS 53 44  BILITOT 0.5 1.3*  PROT 6.7 6.2*  ALBUMIN 3.5 3.1*    Recent Results (from the past 240 hour(s))  SARS Coronavirus 2 by RT PCR (hospital order, performed in [facility] hospital lab) *cepheid single result test* Anterior Nasal Swab     Status: None   Collection Time: 07/28/22  1:04 PM   Specimen: Anterior Nasal Swab  Result Value Ref Range Status   SARS Coronavirus 2 by RT PCR NEGATIVE NEGATIVE Final    Comment: (NOTE) SARS-CoV-2 target nucleic acids are NOT DETECTED.  The SARS-CoV-2 RNA is generally detectable in upper and lower respiratory specimens during the acute phase of infection. The lowest concentration of SARS-CoV-2 viral copies this assay can detect is 250 copies / mL. A negative result does not preclude SARS-CoV-2 infection and should not be used as the sole basis for treatment or other patient management decisions.  A negative result may occur with improper specimen collection / handling, submission of specimen other than nasopharyngeal swab, presence of viral mutation(s) within the areas targeted by this assay, and inadequate number of viral copies (<250 copies / mL). A negative result must be combined with clinical observations, patient history, and epidemiological information.  Fact Sheet for Patients:   https://www.patel.info/  Fact Sheet for Healthcare Providers: https://hall.com/  This  test is not yet approved or  cleared by the [place] FDA and has been authorized for detection and/or diagnosis of SARS-CoV-2 by FDA under an Emergency Use Authorization (EUA).  This EUA will remain in effect (meaning this test can be used) for the duration of the COVID-19 declaration under Section 564(b)(1) of the Act, 21 U.S.C. section 360bbb-3(b)(1), unless the authorization is terminated or revoked sooner.  Performed at [facility], [place] [address] [place], [place] [address]     Antimicrobials: Anti-infectives (From admission, onward)    None      Culture/Microbiology No results found for: "SDES", "SPECREQUEST", "CULT", "REPTSTATUS"  Radiology Studies: DG Chest 2 View  Result Date: 07/28/2022 CLINICAL DATA:  Fever EXAM: CHEST - 2 VIEW COMPARISON:  Radiograph 11/29/2021 FINDINGS: Unchanged cardiomediastinal silhouette. There is no focal airspace consolidation. There is no pleural effusion or evidence of pneumothorax. There is no acute osseous abnormality. IMPRESSION: No evidence of acute cardiopulmonary disease. Electronically Signed   By: Maurine Simmering M.D.   On: 07/28/2022 14:03     LOS: 1 day   Antonieta Pert, MD [facility]  07/30/2022, 7:24 AM

## 2022-07-31 DIAGNOSIS — D62 Acute posthemorrhagic anemia: Secondary | ICD-10-CM | POA: Diagnosis not present

## 2022-07-31 LAB — HEMOGLOBIN AND HEMATOCRIT, BLOOD
HCT: 30.9 % — ABNORMAL LOW (ref 36.0–46.0)
Hemoglobin: 9 g/dL — ABNORMAL LOW (ref 12.0–15.0)

## 2022-07-31 MED ORDER — FOLIC ACID 1 MG PO TABS
1.0000 mg | ORAL_TABLET | Freq: Every day | ORAL | Status: DC
  Filled 2022-07-31 (×2): qty 1

## 2022-07-31 MED ORDER — SODIUM CHLORIDE 0.9 % IV SOLN
500.0000 mg | Freq: Once | INTRAVENOUS | Status: DC
  Filled 2022-07-31: qty 25

## 2022-07-31 MED ORDER — VITAMIN B-12 1000 MCG PO TABS
1000.0000 ug | ORAL_TABLET | Freq: Every day | ORAL | Status: DC
  Filled 2022-07-31 (×2): qty 1

## 2022-07-31 NOTE — Progress Notes (Signed)
PROGRESS NOTE Valerie Howard  DTO:[id number] DOB: 28-Mar-1976 DOA: 07/28/2022 PCP: Patient, No Pcp Per   Brief Narrative/Hospital Course:  46 y.o. female with a past medical history of uterine fibroids, menorrhagia, previous history of blood transfusion for the same issue, iron deficiency, history of schizophrenia and paranoid behavior who presented to the emergency department with complaints of heavy menstruation ongoing for the past few days.  The last time she had such heavy bleeding was several months ago.  She has had blood transfusion for low hemoglobin previously.  She denies any abdominal pain.  She has been feeling fatigued.  Has been weak but denies any syncopal episode.  No nausea or vomiting.  She does admit to having had a cough for the past day or so.  She has been coughing up sputum but does not know the color of the sputum.  Denies any shortness of breath per se. In the emergency department she was noted to have a hemoglobin of 5.2.  Lactic acid level was normal.  WBC was normal.  COVID-19 test was negative.  Chest x-ray was unremarkable.  Urine pregnancy test was negative.  Blood transfusion was ordered.  Gyn has been consulted So far 3 units PRBC hemoglobin has remained stable patient reports vaginal bleeding has improved on Megace.  She had febrile episode chest congestion cough and tested positive for influenza A  and placed on Tamiflu   Subjective: Seen and examined this morning appears somewhat upset  does not want to take IV iron Waiting to speak with social worker Afebrile since yesterday hemodynamically stable No more chest congestion cough.  Assessment and Plan:  Acute on chronic blood loss anemia Iron deficiency microcytic anemia B12 deficiency: Due to her menorrhagia with fibroids s/p 3 units PRBC> Hb improved to 9.1 g.  Continue iron supplementation.added B12 injection but refusing will change to p.o.  Continue folate supplement.  Discussed about iron  transfusion-she does not to want it now.  Initially did not offer IV Iron transfusion given her fever Recent Labs  Lab 07/28/22 1344 07/29/22 0549 07/29/22 1607 07/30/22 0319 07/31/22 0744  HGB 6.8* 6.8* 8.4* 8.4* 9.0*  HCT 20.0* 23.7* 27.5* 27.2* 30.9*   Menorrhagia likely due to uterine fibroids chronic history: In the ED refused ultrasound and pelvic exam.  Reports her heavy bleeding has resolved.continue Megace 80 mg daily appreciate GYN inputs> she will will need close follow-up with gynecology  B12 deficiency at 108 started on high-dose replacement, refused injection will change to p.o.  Influenza A: in ED feever on 101.3 > again having intermittent febrile episodes chest x-ray stable ,respiratory virus panel positive > started  Tamiflu 07/30/22 x5 days.  Feeling improved   Hx of schizophrenia/paranoid behavior: This has been her chronic problem.  She has been evaluated by psychiatry on multiple occasions previously.  Apparently she does well with Haldol doses frequently refuses medication.  Unfortunately she declined psychiatric eval again.Patient currently is alert awake oriented x3 acting appropriately denies suicidal homicidal ideation.Reports she is homeless.She is currently calm, Cooperative. Of note she refused pelvic exam.Will benefit with outpatient follow-up.    Mild hyperglycemia: Fasting blood sugar stable  Homelessness: TOC consulted.  DVT prophylaxis: SCDs Start: 07/28/22 1932 Code Status:   Code Status: Full Code Family Communication: plan of care discussed with patient at bedside.  Extensively discussed that she needs B12 injection as well as iron supplementation so that she can maintain her hemoglobin and needs to follow up w/ GYN.  Patient status is:  Inpatient due to acute blood loss anemia   Level of care: Med-Surg  Dispo: The patient is from: homeless            Anticipated disposition: TBD.  TOC consulted for disposition Objective: Vitals last 24  hrs: Vitals:   07/30/22 1452 07/30/22 2040 07/31/22 0601 07/31/22 0803  BP: 129/73 (!) 124/59 129/83 128/75  Pulse: 93 98 86 93  Resp: '17 18 18 18  '$ Temp: 100.2 F (37.9 C) 99.9 F (37.7 C) 98.6 F (37 C) 98.7 F (37.1 C)  TempSrc: Oral Oral Oral Oral  SpO2: 100% 100% 99% 100%   Weight change:   Physical Examination: General exam: alert awake,older than stated age HEENT:Oral mucosa moist, Ear/Nose WNL grossly Respiratory system: Bilaterally clear BS, no use of accessory muscle Cardiovascular system: S1 & S2 +, No JVD. Gastrointestinal system: Abdomen soft,NT,ND, BS+ Nervous System: Alert, awake oriented x3, moving extremities well non focal, she follows commands. Extremities: LE edema neg,distal peripheral pulses palpable.  Skin: No rashes,no icterus. MSK: Normal muscle bulk,tone, power  Medications reviewed:  Scheduled Meds:  cyanocobalamin  1,000 mcg Subcutaneous Q0600   dextromethorphan-guaiFENesin  1 tablet Oral BID   iron polysaccharides  150 mg Oral Daily   loratadine  10 mg Oral Daily   megestrol  80 mg Oral Daily   multivitamin with minerals  1 tablet Oral Daily   oseltamivir  75 mg Oral BID   thiamine  100 mg Oral Daily   Continuous Infusions:  sodium chloride      Diet Order             Diet regular Room service appropriate? Yes; Fluid consistency: Thin  Diet effective now                  No intake or output data in the 24 hours ending 07/31/22 1526  Net IO Since Admission: 1,205.42 mL [07/31/22 1526]  Wt Readings from Last 3 Encounters:  09/02/21 81.6 kg  04/01/21 86.5 kg  01/22/21 74.8 kg     Unresulted Labs (From admission, onward)    None     Data Reviewed: I have personally reviewed following labs and imaging studies CBC: Recent Labs  Lab 07/28/22 1300 07/28/22 1344 07/29/22 0549 07/29/22 1607 07/30/22 0319 07/31/22 0744  WBC 6.5  --  5.8  --  7.2  --   NEUTROABS 5.0  --   --   --   --   --   HGB 5.2* 6.8* 6.8* 8.4* 8.4*  9.0*  HCT 20.0* 20.0* 23.7* 27.5* 27.2* 30.9*  MCV 71.7*  --  71.6*  --  72.7*  --   PLT 265  --  228  --  266  --    Basic Metabolic Panel: Recent Labs  Lab 07/28/22 1300 07/28/22 1344 07/29/22 0549 07/30/22 0319  NA 140 142 138 140  K 4.0 3.8 3.6 3.7  CL 113* 109 109 108  CO2 18*  --  24 24  GLUCOSE 113* 118* 96 93  BUN '12 12 10 7  '$ CREATININE 0.97 0.90 0.94 0.90  CALCIUM 8.4*  --  8.1* 8.8*   GFR: CrCl cannot be calculated (Unknown ideal weight.). Liver Function Tests: Recent Labs  Lab 07/28/22 1300 07/29/22 0549  AST 20 14*  ALT 9 7  ALKPHOS 53 44  BILITOT 0.5 1.3*  PROT 6.7 6.2*  ALBUMIN 3.5 3.1*   Recent Results (from the past 240 hour(s))  SARS Coronavirus 2 by RT PCR (  hospital order, performed in [facility] hospital lab) *cepheid single result test* Anterior Nasal Swab     Status: None   Collection Time: 07/28/22  1:04 PM   Specimen: Anterior Nasal Swab  Result Value Ref Range Status   SARS Coronavirus 2 by RT PCR NEGATIVE NEGATIVE Final    Comment: (NOTE) SARS-CoV-2 target nucleic acids are NOT DETECTED.  The SARS-CoV-2 RNA is generally detectable in upper and lower respiratory specimens during the acute phase of infection. The lowest concentration of SARS-CoV-2 viral copies this assay can detect is 250 copies / mL. A negative result does not preclude SARS-CoV-2 infection and should not be used as the sole basis for treatment or other patient management decisions.  A negative result may occur with improper specimen collection / handling, submission of specimen other than nasopharyngeal swab, presence of viral mutation(s) within the areas targeted by this assay, and inadequate number of viral copies (<250 copies / mL). A negative result must be combined with clinical observations, patient history, and epidemiological information.  Fact Sheet for Patients:   https://www.patel.info/  Fact Sheet for Healthcare  Providers: https://hall.com/  This test is not yet approved or  cleared by the [place] FDA and has been authorized for detection and/or diagnosis of SARS-CoV-2 by FDA under an Emergency Use Authorization (EUA).  This EUA will remain in effect (meaning this test can be used) for the duration of the COVID-19 declaration under Section 564(b)(1) of the Act, 21 U.S.C. section 360bbb-3(b)(1), unless the authorization is terminated or revoked sooner.  Performed at [facility], [place] [address], [place], [place] [address]   Respiratory (~20 pathogens) panel by PCR     Status: Abnormal   Collection Time: 07/30/22  7:50 AM   Specimen: Nasopharyngeal Swab; Respiratory  Result Value Ref Range Status   Adenovirus NOT DETECTED NOT DETECTED Final   Coronavirus 229E NOT DETECTED NOT DETECTED Final    Comment: (NOTE) The Coronavirus on the Respiratory Panel, DOES NOT test for the novel  Coronavirus (2019 nCoV)    Coronavirus HKU1 NOT DETECTED NOT DETECTED Final   Coronavirus NL63 NOT DETECTED NOT DETECTED Final   Coronavirus OC43 NOT DETECTED NOT DETECTED Final   Metapneumovirus NOT DETECTED NOT DETECTED Final   Rhinovirus / Enterovirus NOT DETECTED NOT DETECTED Final   Influenza A H1 2009 DETECTED (A) NOT DETECTED Final   Influenza B NOT DETECTED NOT DETECTED Final   Parainfluenza Virus 1 NOT DETECTED NOT DETECTED Final   Parainfluenza Virus 2 NOT DETECTED NOT DETECTED Final   Parainfluenza Virus 3 NOT DETECTED NOT DETECTED Final   Parainfluenza Virus 4 NOT DETECTED NOT DETECTED Final   Respiratory Syncytial Virus NOT DETECTED NOT DETECTED Final   Bordetella pertussis NOT DETECTED NOT DETECTED Final   Bordetella Parapertussis NOT DETECTED NOT DETECTED Final   Chlamydophila pneumoniae NOT DETECTED NOT DETECTED Final   Mycoplasma pneumoniae NOT DETECTED NOT DETECTED Final    Comment: Performed at [facility], [place], [place], [place] [address]     Antimicrobials: Anti-infectives (From admission, onward)    Start     Dose/Rate Route Frequency Ordered Stop   07/30/22 1145  oseltamivir (TAMIFLU) capsule 75 mg        75 mg Oral 2 times daily 07/30/22 1045 08/04/22 0959   07/30/22 1000  azithromycin (ZITHROMAX) tablet 500 mg  Status:  Discontinued        500 mg Oral Daily 07/30/22 0806 07/30/22 1045  Culture/Microbiology No results found for: "SDES", "SPECREQUEST", "CULT", "REPTSTATUS"  Radiology Studies: DG Chest Port 1 View  Result Date: 07/30/2022 CLINICAL DATA:  46 year old female with history of cough. EXAM: PORTABLE CHEST 1 VIEW COMPARISON:  Chest x-ray 07/28/2022. FINDINGS: Lung volumes are normal. No consolidative airspace disease. No pleural effusions. No pneumothorax. No pulmonary nodule or mass noted. Pulmonary vasculature and the cardiomediastinal silhouette are within normal limits. IMPRESSION: No radiographic evidence of acute cardiopulmonary disease. Electronically Signed   By: Vinnie Langton M.D.   On: 07/30/2022 09:00     LOS: 2 days   Antonieta Pert, MD [facility]  07/31/2022, 3:26 PM

## 2022-07-31 NOTE — Plan of Care (Signed)

## 2022-08-01 ENCOUNTER — Other Ambulatory Visit (HOSPITAL_COMMUNITY): Payer: Self-pay

## 2022-08-01 DIAGNOSIS — D62 Acute posthemorrhagic anemia: Secondary | ICD-10-CM | POA: Diagnosis not present

## 2022-08-01 DIAGNOSIS — R454 Irritability and anger: Secondary | ICD-10-CM

## 2022-08-01 MED ORDER — FOLIC ACID 1 MG PO TABS
1.0000 mg | ORAL_TABLET | Freq: Every day | ORAL | 0 refills | Status: AC
  Filled 2022-08-01: qty 30, 30d supply, fill #0

## 2022-08-01 MED ORDER — OSELTAMIVIR PHOSPHATE 75 MG PO CAPS
75.0000 mg | ORAL_CAPSULE | Freq: Two times a day (BID) | ORAL | 0 refills | Status: DC
  Filled 2022-08-01: qty 6, 3d supply, fill #0

## 2022-08-01 MED ORDER — MEGESTROL ACETATE 40 MG PO TABS
20.0000 mg | ORAL_TABLET | Freq: Two times a day (BID) | ORAL | 0 refills | Status: DC
  Filled 2022-08-01: qty 30, 30d supply, fill #0

## 2022-08-01 MED ORDER — CYANOCOBALAMIN 1000 MCG PO TABS
1000.0000 ug | ORAL_TABLET | Freq: Every day | ORAL | 0 refills | Status: AC
  Filled 2022-08-01: qty 30, 30d supply, fill #0

## 2022-08-01 MED ORDER — POLY-IRON 150 FORTE 150-25-1 MG-MCG-MG PO CAPS
150.0000 mg | ORAL_CAPSULE | Freq: Two times a day (BID) | ORAL | 0 refills | Status: AC
  Filled 2022-08-01: qty 60, 30d supply, fill #0

## 2022-08-01 NOTE — Progress Notes (Signed)
Gyn follow up Subjective: Patient reports no vaginal bleeding while taking Megace, s/p transfusion.  She was previously a patient of Dr. Victory Dakin who is retired. If possible she would like to f/u with [facility] which is his old practice  Objective: I have reviewed patient's vital signs, medications, and labs.  General: alert, cooperative, and no distress Cardio: regular rate and rhythm Vaginal Bleeding: none CBC    Component Value Date/Time   WBC 7.2 07/30/2022 0319   RBC 3.74 (L) 07/30/2022 0319   HGB 9.0 (L) 07/31/2022 0744   HCT 30.9 (L) 07/31/2022 0744   PLT 266 07/30/2022 0319   MCV 72.7 (L) 07/30/2022 0319   MCH 22.5 (L) 07/30/2022 0319   MCHC 30.9 07/30/2022 0319   RDW 20.9 (H) 07/30/2022 0319   LYMPHSABS 1.0 07/28/2022 1300   MONOABS 0.4 07/28/2022 1300   EOSABS 0.0 07/28/2022 1300   BASOSABS 0.0 07/28/2022 1300     Assessment/Plan: Fibroid uterus with menorrhagia. She should continue Megace until she f/u with Gyn either with [facility] or [facility]. She may be a candidate for uterine artery embolization by interventional radiology. Thanks for caring for her  LOS: 3 days    Emeterio Reeve, MD 08/01/2022, 11:56 AM

## 2022-08-01 NOTE — TOC Progression Note (Signed)
Transition of Care [facility]) - Progression Note    Patient Details  Name: ALLIEN MELBERG MRN: [id number] Date of Birth: 1976/01/15  Transition of Care [facility]) CM/SW [place], RN Phone Number: 08/01/2022, 4:32 PM  Clinical Narrative:    CM met with the patient at the bedside and she states that "I do not have a place to stay".  The patient was offered shelter resources but declined them.    The patient states that she is able to make her own appointment for PCP.  Follow up placed in the discharge instructions regarding follow up with [facility] clinic.  The patient states that I still have a fever,  The patient will likely be discharged in the am after review of labs.  TOC will provide bus passes to patient in the am for transportation to [facility].   Expected Discharge Plan: Home/Self Care    Expected Discharge Plan and Services Expected Discharge Plan: Home/Self Care   Discharge Planning Services: CM Consult   Living arrangements for the past 2 months: Homeless                                       Social Determinants of Health (SDOH) Interventions    Readmission Risk Interventions    08/01/2022    4:32 PM  Readmission Risk Prevention Plan  Post Dischage Appt Complete  Medication Screening Complete  Transportation Screening Complete

## 2022-08-01 NOTE — Consult Note (Cosign Needed Addendum)
[facility] Face-to-Face Psychiatry Consult  Reason for Consult:  Psychosis, refuisng meds Referring Physician:  Dr. Antonieta Pert Patient Identification: Valerie Howard MRN:  [id number] Principal Diagnosis: Acute blood loss anemia Diagnosis:  Principal Problem:   Acute blood loss anemia Active Problems:   Anemia, iron deficiency   Symptomatic anemia   Fibroids   Acute on chronic blood loss anemia   Total Time spent with patient: 45 minutes  Subjective:   Valerie Howard is a 46 y.o. female patient admitted with acute blood loss anemia.  Patient attempted to assess, however declines psychiatric service.  Patient is very well-known to our service, at this time appears to be in no acute danger to herself or others.  She does have a history of verbal aggression, belligerence, combativeness, agitation.  While she is verbally aggressive and boisterous, she currently is not a danger to herself or others.  She does not currently meet criteria for nonemergency forced medication over objection.  Patient has declined psychiatric services.   Writer made several attempts to connect with patient and she remained guarded, verbally aggressive with Probation officer, and continued to excuse me out of the room.  Writer attempted to enter the room, however patient would not allow me to [facility] my PPE. Patient would not engage with Probation officer at all. She is alert and oriented, calm and uncooperative. Her mood is irritable and angry, and her affect is congruent and labile. Upon entering the room patient was sitting up in bed and talking on the phone. Writer spoke to her in a friendly manner, and patient states "you said that like I know you. Who are you?  But I dont know you. What the fuck you want?" Patient appears to be having normal thought processes, although she is declining several services and medications. She is able to accurately recall staff names that work at [facility], and answers questions appropriately albeit with vulgar ness and  obscenities she answers. She does not appear to be responding to internal stimuli, external stimuli and does not appear to be preoccupied. She does not appear to be psychotic, grandiose, or delusional at this time. The behaviors she is exhibiting are opposition, defiance, easily agitated, chronically irritable. She remains irritable poorly cooperative but no longer appears paranoid or delusional.     HPI:  46 y.o. female with a past medical history of uterine fibroids, menorrhagia, previous history of blood transfusion for the same issue, iron deficiency, history of schizophrenia and paranoid behavior who presented to the emergency department with complaints of heavy menstruation ongoing for the past few days.  The last time she had such heavy bleeding was several months ago.  She has had blood transfusion for low hemoglobin previously.  She denies any abdominal pain.  She has been feeling fatigued.  Has been weak but denies any syncopal episode.  No nausea or vomiting.  She does admit to having had a cough for the past day or so.  She has been coughing up sputum but does not know the color of the sputum.  Denies any shortness of breath per se. In the emergency department she was noted to have a hemoglobin of 5.2.  Lactic acid level was normal.  WBC was normal.  COVID-19 test was negative.  Chest x-ray was unremarkable.  Urine pregnancy test was negative.  Blood transfusion was ordered.  Gyn has been consulted  Past Psychiatric History: Schizophrenia diagnosis. No current outaptient medications verified by dispense report. No current outpatient providers. Previous medicaions of Haldol  Risk to Self:  Denies Risk to Others:  Denies Prior Inpatient Therapy:   Denies Prior Outpatient Therapy:  Denies  Past Medical History:  Past Medical History:  Diagnosis Date   History of radiation therapy 07/18/17-07/24/17   right ear, operative bed 12 Gy in 3 fractions   Keloid    right ear   Paranoid behavior  ([place])    Schizophrenia ([place])     Past Surgical History:  Procedure Laterality Date   CESAREAN SECTION     KENALOG INJECTION Bilateral 07/17/2017   Procedure: KENALOG INJECTION;  Surgeon: Wallace Going, DO;  Location: [place];  Service: Plastics;  Laterality: Bilateral;   MASS EXCISION N/A 07/17/2017   Procedure: EXCISION KELOID OF RIGHT EAR WITH INTROPERATIVE KENALOG INJECTION AND POST OP RADIATION;  Surgeon: Wallace Going, DO;  Location: [place];  Service: Plastics;  Laterality: N/A;   TUBAL LIGATION     Family History:  Family History  Problem Relation Age of Onset   Hypertension Other    Diabetes Other    Cancer Other    Keloids Mother    Breast cancer Maternal Grandmother    Family Psychiatric  History: Denies Social History:  Social History   Substance and Sexual Activity  Alcohol Use Yes   Comment: socially     Social History   Substance and Sexual Activity  Drug Use No    Social History   Socioeconomic History   Marital status: Divorced    Spouse name: Not on file   Number of children: 4   Years of education: Not on file   Highest education level: Not on file  Occupational History   Occupation: unemployed  Tobacco Use   Smoking status: Never   Smokeless tobacco: Never  Vaping Use   Vaping Use: Never used  Substance and Sexual Activity   Alcohol use: Yes    Comment: socially   Drug use: No   Sexual activity: Not Currently    Birth control/protection: Surgical  Other Topics Concern   Not on file  Social History Narrative   Not on file   Social Determinants of Health   Financial Resource Strain: Not on file  Food Insecurity: Not on file  Transportation Needs: Not on file  Physical Activity: Not on file  Stress: Not on file  Social Connections: Not on file   Additional Social History:    Allergies:   Allergies  Allergen Reactions   Penicillins Hives and Itching    Has patient had a PCN  reaction causing immediate rash, facial/tongue/throat swelling, SOB or lightheadedness with hypotension:Patient refuses to answer (PRA) Has patient had a PCN reaction causing severe rash involving mucus membranes or skin necrosis:PRA Has patient had a PCN reaction that required hospitalization PRA Has patient had a PCN reaction occurring within the last 10 years:PRA If all of the above answers are "NO", then may proceed with Cephalosporin use.     Labs:  Results for orders placed or performed during the hospital encounter of 07/28/22 (from the past 48 hour(s))  Hemoglobin and hematocrit, blood     Status: Abnormal   Collection Time: 07/31/22  7:44 AM  Result Value Ref Range   Hemoglobin 9.0 (L) 12.0 - 15.0 g/dL   HCT 30.9 (L) 36.0 - 46.0 %    Comment: Performed at [facility], [address], [place], [place] [address]    Current Facility-Administered Medications  Medication Dose Route Frequency Provider Last  Rate Last Admin   0.9 %  sodium chloride infusion  10 mL/hr Intravenous Once Bonnielee Haff, MD       acetaminophen (TYLENOL) tablet 650 mg  650 mg Oral Q6H PRN Bonnielee Haff, MD   650 mg at 08/01/22 6256   Or   acetaminophen (TYLENOL) suppository 650 mg  650 mg Rectal Q6H PRN Bonnielee Haff, MD       cyanocobalamin (VITAMIN B12) tablet 1,000 mcg  1,000 mcg Oral Daily Kc, Ramesh, MD       dextromethorphan-guaiFENesin ([place] [facility]) 30-600 MG per 12 hr tablet 1 tablet  1 tablet Oral BID Kc, Maren [place], MD   1 tablet at 38/93/73 4287   folic acid (FOLVITE) tablet 1 mg  1 mg Oral Daily Kc, Ramesh, MD       guaiFENesin (ROBITUSSIN) 100 MG/5ML liquid 5 mL  5 mL Oral Q4H PRN Bonnielee Haff, MD   5 mL at 08/01/22 6811   iron polysaccharides (NIFEREX) capsule 150 mg  150 mg Oral Daily Kc, Ramesh, MD       loratadine (CLARITIN) tablet 10 mg  10 mg Oral Daily Kc, Ramesh, MD   10 mg at 07/30/22 1213   megestrol (MEGACE) tablet 80 mg  80 mg Oral Daily Bonnielee Haff, MD   80 mg at  08/01/22 5726   multivitamin with minerals tablet 1 tablet  1 tablet Oral Daily Bonnielee Haff, MD       ondansetron [facility]) tablet 4 mg  4 mg Oral Q6H PRN Bonnielee Haff, MD       Or   ondansetron [facility]) injection 4 mg  4 mg Intravenous Q6H PRN Bonnielee Haff, MD       Oral care mouth rinse  15 mL Mouth Rinse PRN Kc, Ramesh, MD       oseltamivir (TAMIFLU) capsule 75 mg  75 mg Oral BID Antonieta Pert, MD   75 mg at 08/01/22 0806    Musculoskeletal: Strength & Muscle Tone: within normal limits Gait & Station: normal Patient leans: N/A            Psychiatric Specialty Exam: Deferred   Physical Exam: Physical Exam Vitals and nursing note reviewed.  Constitutional:      Appearance: Normal appearance. She is normal weight.  Skin:    Capillary Refill: Capillary refill takes less than 2 seconds.  Neurological:     General: No focal deficit present.     Mental Status: She is alert and oriented to person, place, and time. Mental status is at baseline.  Psychiatric:        Attention and Perception: Attention normal.        Mood and Affect: Affect is labile, angry and inappropriate.        Speech: Speech is rapid and pressured.        Behavior: Behavior is agitated, aggressive and hyperactive.        Judgment: Judgment is impulsive and inappropriate.    ROS Blood pressure 129/80, pulse 90, temperature 99.3 F (37.4 C), temperature source Oral, resp. rate 16, last menstrual period 07/25/2022, SpO2 99 %. There is no height or weight on file to calculate BMI.  Treatment Plan Summary: Daily contact with patient to assess and evaluate symptoms and progress in treatment and Medication management  Patient discussed Dr. Lovette Cliche.  Patient is a 46 year old female well-known to behavioral health service.  Patient has history of schizoaffective disorder and responds well to Haldol.  Patient would NOT benefit from forced medications to  assist with stabilization at this time. May need  to reassess daily, her refusal of medication has not impacted her current care regimen.  Based on my assessment today, patient would NOT benefit from inpatient psychiatric treatment once medically cleared.  She has always refused medications despite knowing the benefits of the medications, and continues to have no real insight regarding her mental illness. She has not required a forced medication consult this admission, and with/without treatment she will continue with these behaviors.  Will recommend working closely with nursing staff, primary team to continue to encourage patient to take her medications. Patient with multiple hospital admissions, chronic impulsivity, chronic mood lability, chronic hostility, and agitation that responds well to Haldol oral and LAI Haldol.  Will continue to follow from a distance.   Recommendations: - Consider telesitter for this patient, although she does not display any acute psychiatric symptoms outside of her baseline. -Recommend Haldol 10 mg twice daily IM or p.o.; No EKG available at this time. Recommend obtaining if patient will cooperate.  -Recommend Ativan 2 mg twice daily IM or p.o., as needed agitation -Recommend APS and consider legal guardianship, patient with long term history of schizophrenia, undifferentiated personality disorder.   Disposition:  Supportive therapy provided about ongoing stressors. Will continue to follow from a distance.   Disposition: No evidence of imminent risk to self or others at present.   Patient does not meet criteria for psychiatric inpatient admission. Supportive therapy provided about ongoing stressors. Refer to IOP. Discussed crisis plan, support from social network, calling 911, coming to the Emergency Department, and calling Suicide Hotline.  Suella Broad, FNP 08/01/2022 10:13 AM

## 2022-08-01 NOTE — Progress Notes (Signed)
PROGRESS NOTE ALZORA HA  DJT:[id number] DOB: 09-28-1975 DOA: 07/28/2022 PCP: Patient, No Pcp Per   Brief Narrative/Hospital Course:  46 y.o. female with a past medical history of uterine fibroids, menorrhagia, previous history of blood transfusion for the same issue, iron deficiency, history of schizophrenia and paranoid behavior who presented to the emergency department with complaints of heavy menstruation ongoing for the past few days.  The last time she had such heavy bleeding was several months ago.  She has had blood transfusion for low hemoglobin previously.  She denies any abdominal pain.  She has been feeling fatigued.  Has been weak but denies any syncopal episode.  No nausea or vomiting.  She does admit to having had a cough for the past day or so.  She has been coughing up sputum but does not know the color of the sputum.  Denies any shortness of breath per se. In the emergency department she was noted to have a hemoglobin of 5.2.  Lactic acid level was normal.  WBC was normal.  COVID-19 test was negative.  Chest x-ray was unremarkable.  Urine pregnancy test was negative.  Blood transfusion was ordered.  Gyn has been consulted So far 3 units PRBC hemoglobin has remained stable patient reports vaginal bleeding has improved on Megace.  She had febrile episode chest congestion cough and tested positive for influenza A  and placed on Tamiflu   Subjective: Seen and examined Resting and eating food Irritated but answers all questions allowed examination including auscultation Denies any fever chest pain congestion shortness of breath. She is alert and oriented x3  Assessment and Plan: Acute on chronic blood loss anemia Iron deficiency microcytic anemia B12 deficiency: Due to her menorrhagia with fibroids s/p 3 units PRBC> Hb improved to 9.1 g.  Continue iron supplementation.added B12 injection but refusing -changed to p.o.  Continue folate supplement.  Refused iron.  Currently  hemodynamically stable anemia improved Recent Labs  Lab 07/28/22 1344 07/29/22 0549 07/29/22 1607 07/30/22 0319 07/31/22 0744  HGB 6.8* 6.8* 8.4* 8.4* 9.0*  HCT 20.0* 23.7* 27.5* 27.2* 30.9*   Menorrhagia likely due to uterine fibroids chronic history: In the ED refused ultrasound and pelvic exam.  Reports her heavy bleeding has resolved.continue Megace , seen by GYN plans for op f/u  B12 deficiency at 108 started on high-dose replacement, refused injection >changed to p.o.  Influenza A: in ED feever on 101.3 >cont  Tamiflu from 07/30/22 x5 days.Improved   Hx of schizophrenia/paranoid behavior: This has been her chronic problem with chronic delusion refusing to take medication or compliance for certain care.She is aware that Megace helps her to stop bleeding and has been taking here, has agreed for blood transfusion, at this time hemodynamically stable.  She is however refusing some of the medication including B12 inj: sates she could take B12  injection if it is IV, I informed her injection comes only in IM or [place]. She is on room she has been seen by psychiatry on multiple occasions previously. Apparently she does well with Haldol doses frequently refuses medication.  We will see if psych input counseling will offer any help.She is alert awake oriented x3,denied suicidal homicidal ideation.C seen by social worker/to help with resources for homeless shelter   Mild hyperglycemia: Fasting blood sugar stable Homelessness: TOC consulted.  DVT prophylaxis: SCDs Start: 07/28/22 1932 Code Status:   Code Status: Full Code Family Communication: plan of care discussed with patient at bedside. Patient status is: Inpatient due to acute  blood loss anemia  Level of care: Med-Surg  Dispo: The patient is from: homeless            Anticipated disposition: Return to previous facility soon  Objective: Vitals last 24 hrs: Vitals:   07/31/22 0803 07/31/22 1740 08/01/22 0350 08/01/22 0800  BP: 128/75 (!)  152/91 (!) 149/82 129/80  Pulse: 93 (!) 102 100 90  Resp: '18  14 16  '$ Temp: 98.7 F (37.1 C) 98.9 F (37.2 C) 98.8 F (37.1 C) 99.3 F (37.4 C)  TempSrc: Oral Oral Oral Oral  SpO2: 100% 100% 100% 99%   Weight change:   Physical Examination: General exam: AAox3, not in distress HEENT:Oral mucosa moist, Ear/Nose WNL grossly, dentition normal. Respiratory system: bilaterally diminished BS,no use of accessory muscle Cardiovascular system: S1 & S2 +, regular rate, JVD neg. Gastrointestinal system: Abdomen soft, NT,ND,BS+ Nervous System:Alert, awake, moving extremities and grossly nonfocal Extremities: LE ankle edema neg, lower extremities warm Skin: No rashes,no icterus. MSK: Normal muscle bulk,tone, power   Medications reviewed:  Scheduled Meds:  vitamin B-12  1,000 mcg Oral Daily   dextromethorphan-guaiFENesin  1 tablet Oral BID   folic acid  1 mg Oral Daily   iron polysaccharides  150 mg Oral Daily   loratadine  10 mg Oral Daily   megestrol  80 mg Oral Daily   multivitamin with minerals  1 tablet Oral Daily   oseltamivir  75 mg Oral BID   Continuous Infusions:  sodium chloride      Diet Order             Diet regular Room service appropriate? Yes; Fluid consistency: Thin  Diet effective now                  No intake or output data in the 24 hours ending 08/01/22 1419  Net IO Since Admission: 1,205.42 mL [08/01/22 1419]  Wt Readings from Last 3 Encounters:  09/02/21 81.6 kg  04/01/21 86.5 kg  01/22/21 74.8 kg     Unresulted Labs (From admission, onward)    None     Data Reviewed: I have personally reviewed following labs and imaging studies CBC: Recent Labs  Lab 07/28/22 1300 07/28/22 1344 07/29/22 0549 07/29/22 1607 07/30/22 0319 07/31/22 0744  WBC 6.5  --  5.8  --  7.2  --   NEUTROABS 5.0  --   --   --   --   --   HGB 5.2* 6.8* 6.8* 8.4* 8.4* 9.0*  HCT 20.0* 20.0* 23.7* 27.5* 27.2* 30.9*  MCV 71.7*  --  71.6*  --  72.7*  --   PLT 265  --   228  --  266  --     Basic Metabolic Panel: Recent Labs  Lab 07/28/22 1300 07/28/22 1344 07/29/22 0549 07/30/22 0319  NA 140 142 138 140  K 4.0 3.8 3.6 3.7  CL 113* 109 109 108  CO2 18*  --  24 24  GLUCOSE 113* 118* 96 93  BUN '12 12 10 7  '$ CREATININE 0.97 0.90 0.94 0.90  CALCIUM 8.4*  --  8.1* 8.8*    GFR: CrCl cannot be calculated (Unknown ideal weight.). Liver Function Tests: Recent Labs  Lab 07/28/22 1300 07/29/22 0549  AST 20 14*  ALT 9 7  ALKPHOS 53 44  BILITOT 0.5 1.3*  PROT 6.7 6.2*  ALBUMIN 3.5 3.1*    Recent Results (from the past 240 hour(s))  SARS Coronavirus 2 by RT PCR (hospital  order, performed in [facility] hospital lab) *cepheid single result test* Anterior Nasal Swab     Status: None   Collection Time: 07/28/22  1:04 PM   Specimen: Anterior Nasal Swab  Result Value Ref Range Status   SARS Coronavirus 2 by RT PCR NEGATIVE NEGATIVE Final    Comment: (NOTE) SARS-CoV-2 target nucleic acids are NOT DETECTED.  The SARS-CoV-2 RNA is generally detectable in upper and lower respiratory specimens during the acute phase of infection. The lowest concentration of SARS-CoV-2 viral copies this assay can detect is 250 copies / mL. A negative result does not preclude SARS-CoV-2 infection and should not be used as the sole basis for treatment or other patient management decisions.  A negative result may occur with improper specimen collection / handling, submission of specimen other than nasopharyngeal swab, presence of viral mutation(s) within the areas targeted by this assay, and inadequate number of viral copies (<250 copies / mL). A negative result must be combined with clinical observations, patient history, and epidemiological information.  Fact Sheet for Patients:   https://www.patel.info/  Fact Sheet for Healthcare Providers: https://hall.com/  This test is not yet approved or  cleared by the [place]  FDA and has been authorized for detection and/or diagnosis of SARS-CoV-2 by FDA under an Emergency Use Authorization (EUA).  This EUA will remain in effect (meaning this test can be used) for the duration of the COVID-19 declaration under Section 564(b)(1) of the Act, 21 U.S.C. section 360bbb-3(b)(1), unless the authorization is terminated or revoked sooner.  Performed at [facility], [place] [address], [place], [place] [address]   Respiratory (~20 pathogens) panel by PCR     Status: Abnormal   Collection Time: 07/30/22  7:50 AM   Specimen: Nasopharyngeal Swab; Respiratory  Result Value Ref Range Status   Adenovirus NOT DETECTED NOT DETECTED Final   Coronavirus 229E NOT DETECTED NOT DETECTED Final    Comment: (NOTE) The Coronavirus on the Respiratory Panel, DOES NOT test for the novel  Coronavirus (2019 nCoV)    Coronavirus HKU1 NOT DETECTED NOT DETECTED Final   Coronavirus NL63 NOT DETECTED NOT DETECTED Final   Coronavirus OC43 NOT DETECTED NOT DETECTED Final   Metapneumovirus NOT DETECTED NOT DETECTED Final   Rhinovirus / Enterovirus NOT DETECTED NOT DETECTED Final   Influenza A H1 2009 DETECTED (A) NOT DETECTED Final   Influenza B NOT DETECTED NOT DETECTED Final   Parainfluenza Virus 1 NOT DETECTED NOT DETECTED Final   Parainfluenza Virus 2 NOT DETECTED NOT DETECTED Final   Parainfluenza Virus 3 NOT DETECTED NOT DETECTED Final   Parainfluenza Virus 4 NOT DETECTED NOT DETECTED Final   Respiratory Syncytial Virus NOT DETECTED NOT DETECTED Final   Bordetella pertussis NOT DETECTED NOT DETECTED Final   Bordetella Parapertussis NOT DETECTED NOT DETECTED Final   Chlamydophila pneumoniae NOT DETECTED NOT DETECTED Final   Mycoplasma pneumoniae NOT DETECTED NOT DETECTED Final    Comment: Performed at [facility], [place]. [address] [place], [place] [address]    Antimicrobials: Anti-infectives (From admission, onward)    Start     Dose/Rate Route Frequency Ordered Stop    08/01/22 0000  oseltamivir (TAMIFLU) 75 MG capsule        75 mg Oral 2 times daily 08/01/22 0744 08/04/22 2359   07/30/22 1145  oseltamivir (TAMIFLU) capsule 75 mg        75 mg Oral 2 times daily 07/30/22 1045 08/04/22 0959   07/30/22 1000  azithromycin (  ZITHROMAX) tablet 500 mg  Status:  Discontinued        500 mg Oral Daily 07/30/22 0806 07/30/22 1045      Culture/Microbiology No results found for: "SDES", "SPECREQUEST", "CULT", "REPTSTATUS"  Radiology Studies: No results found.   LOS: 3 days   Antonieta Pert, MD [facility]  08/01/2022, 2:19 PM

## 2022-08-02 ENCOUNTER — Other Ambulatory Visit (HOSPITAL_COMMUNITY): Payer: Self-pay

## 2022-08-02 DIAGNOSIS — D62 Acute posthemorrhagic anemia: Secondary | ICD-10-CM | POA: Diagnosis not present

## 2022-08-02 LAB — CBC
HCT: 31.9 % — ABNORMAL LOW (ref 36.0–46.0)
Hemoglobin: 9.4 g/dL — ABNORMAL LOW (ref 12.0–15.0)
MCH: 22 pg — ABNORMAL LOW (ref 26.0–34.0)
MCHC: 29.5 g/dL — ABNORMAL LOW (ref 30.0–36.0)
MCV: 74.7 fL — ABNORMAL LOW (ref 80.0–100.0)
Platelets: 354 10*3/uL (ref 150–400)
RBC: 4.27 MIL/uL (ref 3.87–5.11)
RDW: 24.1 % — ABNORMAL HIGH (ref 11.5–15.5)
WBC: 5.8 10*3/uL (ref 4.0–10.5)
nRBC: 0 % (ref 0.0–0.2)

## 2022-08-02 MED ORDER — MEGESTROL ACETATE 40 MG PO TABS
40.0000 mg | ORAL_TABLET | Freq: Two times a day (BID) | ORAL | 0 refills | Status: DC
  Filled 2022-08-02: qty 60, 30d supply, fill #0

## 2022-08-02 NOTE — Progress Notes (Signed)
Ms. Valerie Howard took all her medications without refusal unlike the night prior. She was in a better mode comparatively. At some point in the night she called complaining on feeling bloated and nauseous. Additionally, she said she felt febrile. In view of this, Tylenol PO and Zofran IV was administered. She then called for a drink and crackers after which she slept.

## 2022-08-02 NOTE — Progress Notes (Signed)
Advised pt to let nurse know when she needed to use restroom in order to check for bleeding. Pt insisted that she would give a urine sample. 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 Pt denies abdominal pain or cramping.

## 2022-08-02 NOTE — Progress Notes (Signed)
PROGRESS NOTE Valerie Howard  YYT:[id number] DOB: 1976/03/11 DOA: 07/28/2022 PCP: Patient, No Pcp Per   Brief Narrative/Hospital Course:  46 y.o. female with a past medical history of uterine fibroids, menorrhagia, previous history of blood transfusion for the same issue, iron deficiency, history of schizophrenia and paranoid behavior who presented to the emergency department with complaints of heavy menstruation ongoing for the past few days.  The last time she had such heavy bleeding was several months ago.  She has had blood transfusion for low hemoglobin previously.  She denies any abdominal pain.  She has been feeling fatigued.  Has been weak but denies any syncopal episode.  No nausea or vomiting.  She does admit to having had a cough for the past day or so.  She has been coughing up sputum but does not know the color of the sputum.  Denies any shortness of breath per se. In the emergency department she was noted to have a hemoglobin of 5.2.  Lactic acid level was normal.  WBC was normal.  COVID-19 test was negative.  Chest x-ray was unremarkable.  Urine pregnancy test was negative.  Blood transfusion was ordered.  Gyn has been consulted So far 3 units PRBC hemoglobin has remained stable patient reports vaginal bleeding has improved on Megace.  She had febrile episode chest congestion cough and tested positive for influenza A  and placed on Tamiflu   Subjective:  Seen and examined this morning somewhat, alert awake oriented x3  Allowed examination and while during interview starting to get irritated Nursing staff at bedside and medicating her  Reported she had bleeding> informed her to call nurse whenever gets bleeding next time She is alert and oriented x3  Assessment and Plan: Acute on chronic blood loss anemia Iron deficiency microcytic anemia B12 deficiency Menorrhagia from uterine fibroids chronic:  She refused ultrasound and pelvic exam. Seen by GYN plans for op f/u. Due to her  menorrhagia with fibroids s/p 3 units PRBC> Hb improved to 9.4 g.  Continue iron supplementation.Refused B12 injection  > changed to p.o but not taking> counseled again, on folate supplementation.  Refused IV iron.  Due to concern for ongoing bleeding will monitor for additional day or so and will continue on Megace while here> on discharge will need 40 mg twice daily x30 days, will need GYN follow-up.  Per Dr. Roselie Awkward if possible she would like to follow-up with [place] which is her old practice-she may be a candidate for uterine artery embolization by IR. Recent Labs  Lab 07/29/22 0549 07/29/22 1607 07/30/22 0319 07/31/22 0744 08/02/22 0622  HGB 6.8* 8.4* 8.4* 9.0* 9.4*  HCT 23.7* 27.5* 27.2* 30.9* 31.9*   Influenza A: in ED feever on 101.3 >cont  Tamiflu from 07/30/22 x5 days.  Hx of schizophrenia/paranoid behavior Homelessness: This has been her chronic problem with chronic delusion refusing to take medication or compliance for certain care.She is aware that Megace helps her to stop bleeding and has been taking here, has agreed for blood transfusion, at this time hemodynamically stable.  She is however refusing some of the medication including B12 inj: sates she could take B12  injection if it is IV, I informed her injection comes only in IM or [place].She was seen by psychiatry on multiple occasions previously. Apparently she does well with Haldol doses frequently refuses medication.  Psych has been consulted> currently no need for inpatient psych admission.  TOC/social worker has been consulted to see if she needs to  notify APS/arrange for homeless shelter [facility]   Mild hyperglycemia: Fasting blood sugar stable  DVT prophylaxis: SCDs Start: 07/28/22 1932 Code Status:   Code Status: Full Code Family Communication: plan of care discussed with patient at bedside. Patient status is: Inpatient due to acute blood loss anemia  Level of care: Med-Surg  Dispo: The patient is from:  homeless            Anticipated disposition: TBD  Objective: Vitals last 24 hrs: Vitals:   08/01/22 2200 08/02/22 0049 08/02/22 0452 08/02/22 0802  BP: 136/78 135/86 125/76 (!) 142/91  Pulse: 78 85 65 95  Resp: '15 20 18 18  '$ Temp: 99.1 F (37.3 C) 98.8 F (37.1 C) 98.4 F (36.9 C) 99.6 F (37.6 C)  TempSrc: Oral Oral Oral Oral  SpO2: 100% 100% 100% 100%   Weight change:   Physical Examination: General exam: AAOX3, weak,older appearing HEENT:Oral mucosa moist, Ear/Nose WNL grossly, dentition normal. Respiratory system: bilaterally CLEAR BS, no use of accessory muscle Cardiovascular system: S1 & S2 +, regular rate. Gastrointestinal system: Abdomen soft, BS+ Nervous System:Alert, awake, moving extremities and grossly nonfocal.  Appears irritated. Extremities: LE ankle edema NEG, lower extremities warm Skin: No rashes,no icterus. MSK: Normal muscle bulk,tone, power   Medications reviewed:  Scheduled Meds:  vitamin B-12  1,000 mcg Oral Daily   dextromethorphan-guaiFENesin  1 tablet Oral BID   folic acid  1 mg Oral Daily   iron polysaccharides  150 mg Oral Daily   loratadine  10 mg Oral Daily   megestrol  80 mg Oral Daily   multivitamin with minerals  1 tablet Oral Daily   oseltamivir  75 mg Oral BID   Continuous Infusions:  sodium chloride      Diet Order             Diet regular Room service appropriate? Yes; Fluid consistency: Thin  Diet effective now                   Intake/Output Summary (Last 24 hours) at 08/02/2022 1441 Last data filed at 08/02/2022 1229 Gross per 24 hour  Intake 240 ml  Output --  Net 240 ml    Net IO Since Admission: 1,445.42 mL [08/02/22 1441]  Wt Readings from Last 3 Encounters:  09/02/21 81.6 kg  04/01/21 86.5 kg  01/22/21 74.8 kg     Unresulted Labs (From admission, onward)    None     Data Reviewed: I have personally reviewed following labs and imaging studies CBC: Recent Labs  Lab 07/28/22 1300 07/28/22 1344  07/29/22 0549 07/29/22 1607 07/30/22 0319 07/31/22 0744 08/02/22 0622  WBC 6.5  --  5.8  --  7.2  --  5.8  NEUTROABS 5.0  --   --   --   --   --   --   HGB 5.2*   < > 6.8* 8.4* 8.4* 9.0* 9.4*  HCT 20.0*   < > 23.7* 27.5* 27.2* 30.9* 31.9*  MCV 71.7*  --  71.6*  --  72.7*  --  74.7*  PLT 265  --  228  --  266  --  354   < > = values in this interval not displayed.    Basic Metabolic Panel: Recent Labs  Lab 07/28/22 1300 07/28/22 1344 07/29/22 0549 07/30/22 0319  NA 140 142 138 140  K 4.0 3.8 3.6 3.7  CL 113* 109 109 108  CO2 18*  --  24 24  GLUCOSE  113* 118* 96 93  BUN '12 12 10 7  '$ CREATININE 0.97 0.90 0.94 0.90  CALCIUM 8.4*  --  8.1* 8.8*    GFR: CrCl cannot be calculated (Unknown ideal weight.). Liver Function Tests: Recent Labs  Lab 07/28/22 1300 07/29/22 0549  AST 20 14*  ALT 9 7  ALKPHOS 53 44  BILITOT 0.5 1.3*  PROT 6.7 6.2*  ALBUMIN 3.5 3.1*    Recent Results (from the past 240 hour(s))  SARS Coronavirus 2 by RT PCR (hospital order, performed in [facility] hospital lab) *cepheid single result test* Anterior Nasal Swab     Status: None   Collection Time: 07/28/22  1:04 PM   Specimen: Anterior Nasal Swab  Result Value Ref Range Status   SARS Coronavirus 2 by RT PCR NEGATIVE NEGATIVE Final    Comment: (NOTE) SARS-CoV-2 target nucleic acids are NOT DETECTED.  The SARS-CoV-2 RNA is generally detectable in upper and lower respiratory specimens during the acute phase of infection. The lowest concentration of SARS-CoV-2 viral copies this assay can detect is 250 copies / mL. A negative result does not preclude SARS-CoV-2 infection and should not be used as the sole basis for treatment or other patient management decisions.  A negative result may occur with improper specimen collection / handling, submission of specimen other than nasopharyngeal swab, presence of viral mutation(s) within the areas targeted by this assay, and inadequate number of viral  copies (<250 copies / mL). A negative result must be combined with clinical observations, patient history, and epidemiological information.  Fact Sheet for Patients:   https://www.patel.info/  Fact Sheet for Healthcare Providers: https://hall.com/  This test is not yet approved or  cleared by the [place] FDA and has been authorized for detection and/or diagnosis of SARS-CoV-2 by FDA under an Emergency Use Authorization (EUA).  This EUA will remain in effect (meaning this test can be used) for the duration of the COVID-19 declaration under Section 564(b)(1) of the Act, 21 U.S.C. section 360bbb-3(b)(1), unless the authorization is terminated or revoked sooner.  Performed at [facility], [place] [address], [place], [place] [address]   Respiratory (~20 pathogens) panel by PCR     Status: Abnormal   Collection Time: 07/30/22  7:50 AM   Specimen: Nasopharyngeal Swab; Respiratory  Result Value Ref Range Status   Adenovirus NOT DETECTED NOT DETECTED Final   Coronavirus 229E NOT DETECTED NOT DETECTED Final    Comment: (NOTE) The Coronavirus on the Respiratory Panel, DOES NOT test for the novel  Coronavirus (2019 nCoV)    Coronavirus HKU1 NOT DETECTED NOT DETECTED Final   Coronavirus NL63 NOT DETECTED NOT DETECTED Final   Coronavirus OC43 NOT DETECTED NOT DETECTED Final   Metapneumovirus NOT DETECTED NOT DETECTED Final   Rhinovirus / Enterovirus NOT DETECTED NOT DETECTED Final   Influenza A H1 2009 DETECTED (A) NOT DETECTED Final   Influenza B NOT DETECTED NOT DETECTED Final   Parainfluenza Virus 1 NOT DETECTED NOT DETECTED Final   Parainfluenza Virus 2 NOT DETECTED NOT DETECTED Final   Parainfluenza Virus 3 NOT DETECTED NOT DETECTED Final   Parainfluenza Virus 4 NOT DETECTED NOT DETECTED Final   Respiratory Syncytial Virus NOT DETECTED NOT DETECTED Final   Bordetella pertussis NOT DETECTED NOT DETECTED Final   Bordetella  Parapertussis NOT DETECTED NOT DETECTED Final   Chlamydophila pneumoniae NOT DETECTED NOT DETECTED Final   Mycoplasma pneumoniae NOT DETECTED NOT DETECTED Final    Comment: Performed at [facility],  [address], [place], [place] [address]    Antimicrobials: Anti-infectives (From admission, onward)    Start     Dose/Rate Route Frequency Ordered Stop   08/01/22 0000  oseltamivir (TAMIFLU) 75 MG capsule        75 mg Oral 2 times daily 08/01/22 0744 08/04/22 2359   07/30/22 1145  oseltamivir (TAMIFLU) capsule 75 mg        75 mg Oral 2 times daily 07/30/22 1045 08/04/22 0959   07/30/22 1000  azithromycin (ZITHROMAX) tablet 500 mg  Status:  Discontinued        500 mg Oral Daily 07/30/22 0806 07/30/22 1045      Culture/Microbiology No results found for: "SDES", "SPECREQUEST", "CULT", "REPTSTATUS"  Radiology Studies: No results found.   LOS: 4 days   Antonieta Pert, MD [facility]  08/02/2022, 2:41 PM

## 2022-08-03 DIAGNOSIS — F209 Schizophrenia, unspecified: Secondary | ICD-10-CM

## 2022-08-03 DIAGNOSIS — J101 Influenza due to other identified influenza virus with other respiratory manifestations: Secondary | ICD-10-CM

## 2022-08-03 DIAGNOSIS — D62 Acute posthemorrhagic anemia: Secondary | ICD-10-CM | POA: Diagnosis not present

## 2022-08-03 DIAGNOSIS — N920 Excessive and frequent menstruation with regular cycle: Secondary | ICD-10-CM | POA: Insufficient documentation

## 2022-08-03 DIAGNOSIS — N939 Abnormal uterine and vaginal bleeding, unspecified: Secondary | ICD-10-CM

## 2022-08-03 DIAGNOSIS — N921 Excessive and frequent menstruation with irregular cycle: Secondary | ICD-10-CM

## 2022-08-03 LAB — CBC
HCT: 30.8 % — ABNORMAL LOW (ref 36.0–46.0)
Hemoglobin: 9.1 g/dL — ABNORMAL LOW (ref 12.0–15.0)
MCH: 22.2 pg — ABNORMAL LOW (ref 26.0–34.0)
MCHC: 29.5 g/dL — ABNORMAL LOW (ref 30.0–36.0)
MCV: 75.1 fL — ABNORMAL LOW (ref 80.0–100.0)
Platelets: 353 10*3/uL (ref 150–400)
RBC: 4.1 MIL/uL (ref 3.87–5.11)
RDW: 24 % — ABNORMAL HIGH (ref 11.5–15.5)
WBC: 6 10*3/uL (ref 4.0–10.5)
nRBC: 0 % (ref 0.0–0.2)

## 2022-08-03 LAB — BASIC METABOLIC PANEL
Anion gap: 13 (ref 5–15)
BUN: 14 mg/dL (ref 6–20)
CO2: 22 mmol/L (ref 22–32)
Calcium: 9.2 mg/dL (ref 8.9–10.3)
Chloride: 105 mmol/L (ref 98–111)
Creatinine, Ser: 0.83 mg/dL (ref 0.44–1.00)
GFR, Estimated: >60 mL/min (ref >60)
Glucose, Bld: 107 mg/dL — ABNORMAL HIGH (ref 70–99)
Potassium: 3.9 mmol/L (ref 3.5–5.1)
Sodium: 140 mmol/L (ref 135–145)

## 2022-08-03 NOTE — TOC Transition Note (Signed)
Transition of Care [facility]) - CM/SW Discharge Note   Patient Details  Name: Valerie Howard MRN: [id number] Date of Birth: 05/31/1976  [facility] CM/SW Contact:  Curlene Labrum, RN Phone Number: 08/03/2022, 4:16 PM   Clinical Narrative:    CM spoke with bedside nursing and patient is being discharged back to the community.  Bedside nursing was provided with bus passes for the patient.  The patient declined shelter resources and security may likely be called for disposition if patient refuses to discharge from the hospital per charge RN.   Final next level of care: Home/Self Care     Patient Goals and CMS Choice   CMS Medicare.gov Compare Post Acute Care list provided to:: Patient Choice offered to / list presented to : Patient  Discharge Placement                       Discharge Plan and Services   Discharge Planning Services: CM Consult                                 Social Determinants of Health (SDOH) Interventions     Readmission Risk Interventions    08/01/2022    4:32 PM  Readmission Risk Prevention Plan  Post Dischage Appt Complete  Medication Screening Complete  Transportation Screening Complete

## 2022-08-03 NOTE — Progress Notes (Signed)
  Progress Note   Patient: Valerie Howard UJW:[id number] DOB: Oct 06, 1975 DOA: 07/28/2022     5 DOS: the patient was seen and examined on 08/03/2022   Brief hospital course: 46 year old woman PMH including uterine fibroids, menorrhagia, schizophrenia, paranoid behavior presenting with heavy vaginal bleeding.  Admitted, treated with blood transfusions, Megace and seen by OB/GYN with recommendation for same and outpatient follow-up.  Also found to have the flu.  Seen by psychiatry, no acute inpatient needs.  Assessment and Plan: Acute on chronic blood loss anemia Iron deficiency microcytic anemia B12 deficiency Menorrhagia from uterine fibroids chronic:  Care complicated by some refusal of medical care.  She refused ultrasound and pelvic exam.  Status post multiple units PRBC with stability now.  Has been refusing B12 injections, IV iron. Continue Megace 40 mg twice daily for 30 days and follow-up with GYN. Per Dr. Roselie Awkward if possible she would like to follow-up with [place] which is her old practice-she may be a candidate for uterine artery embolization by IR.   Influenza A  Treat with Tamiflu 5 days   Schizophrenia/paranoid behavior Homelessness: Seen by psychiatry, felt to be stable, no inpatient psychiatric hospitalization warranted.     Subjective:  Feels ok but dizzy "I am not leaving" "I don't trust you" "You will be speaking with my attorney shortly"  Margaretha Sheffield RN present  Physical Exam: Vitals:   08/02/22 1604 08/02/22 1931 08/03/22 0500 08/03/22 0813  BP: 133/83 136/77 135/82 126/75  Pulse: (!) 105 (!) 102 77 85  Resp: '18 17 18 16  '$ Temp: 98.3 F (36.8 C) 98.3 F (36.8 C) 98.6 F (37 C) 98.7 F (37.1 C)  TempSrc: Oral Oral Oral Oral  SpO2: 97% 99% 98% 100%   Physical Exam Vitals reviewed. Exam conducted with a chaperone present.  Constitutional:      General: She is not in acute distress.    Appearance: She is not ill-appearing or  toxic-appearing.  Cardiovascular:     Rate and Rhythm: Normal rate and regular rhythm.     Heart sounds: No murmur heard. Pulmonary:     Effort: No respiratory distress.     Breath sounds: No wheezing, rhonchi or rales.  Neurological:     Mental Status: She is alert.  Psychiatric:        Mood and Affect: Mood is anxious. Affect is labile, angry and inappropriate.        Speech: Speech is rapid and pressured.        Behavior: Behavior is uncooperative.        Thought Content: Thought content is paranoid.     Data Reviewed: BMP noted Hgb stable 9.1  Family Communication: none  Disposition: Status is: Inpatient Appears better, likely discharge tomorrow.  Planned Discharge Destination: Home    Time spent: 35 minutes  Author: Murray Hodgkins, MD 08/03/2022 7:33 PM  For on call review [email or web address].

## 2022-08-03 NOTE — Progress Notes (Signed)
Pt refused vitals at this time and started getting irate, RN notified.

## 2022-08-04 ENCOUNTER — Emergency Department (HOSPITAL_COMMUNITY)
Admission: EM | Admit: 2022-08-04 | Discharge: 2022-08-04 | Disposition: A | Discharge status: Home / Self Care | Payer: Medicaid Other | Attending: Emergency Medicine | Admitting: Emergency Medicine

## 2022-08-04 ENCOUNTER — Other Ambulatory Visit (HOSPITAL_COMMUNITY): Payer: Self-pay

## 2022-08-04 ENCOUNTER — Other Ambulatory Visit: Payer: Self-pay

## 2022-08-04 DIAGNOSIS — N938 Other specified abnormal uterine and vaginal bleeding: Secondary | ICD-10-CM

## 2022-08-04 DIAGNOSIS — N939 Abnormal uterine and vaginal bleeding, unspecified: Secondary | ICD-10-CM | POA: Diagnosis not present

## 2022-08-04 DIAGNOSIS — Z5900 Homelessness unspecified: Secondary | ICD-10-CM | POA: Insufficient documentation

## 2022-08-04 DIAGNOSIS — R42 Dizziness and giddiness: Secondary | ICD-10-CM | POA: Insufficient documentation

## 2022-08-04 DIAGNOSIS — D62 Acute posthemorrhagic anemia: Secondary | ICD-10-CM | POA: Diagnosis not present

## 2022-08-04 LAB — CBC WITH DIFFERENTIAL/PLATELET
Abs Immature Granulocytes: 0.03 10*3/uL (ref 0.00–0.07)
Basophils Absolute: 0 10*3/uL (ref 0.0–0.1)
Basophils Relative: 0 %
Eosinophils Absolute: 0 10*3/uL (ref 0.0–0.5)
Eosinophils Relative: 0 %
HCT: 31.5 % — ABNORMAL LOW (ref 36.0–46.0)
Hemoglobin: 9.1 g/dL — ABNORMAL LOW (ref 12.0–15.0)
Immature Granulocytes: 0 %
Lymphocytes Relative: 30 %
Lymphs Abs: 2.4 10*3/uL (ref 0.7–4.0)
MCH: 22 pg — ABNORMAL LOW (ref 26.0–34.0)
MCHC: 28.9 g/dL — ABNORMAL LOW (ref 30.0–36.0)
MCV: 76.1 fL — ABNORMAL LOW (ref 80.0–100.0)
Monocytes Absolute: 0.4 10*3/uL (ref 0.1–1.0)
Monocytes Relative: 5 %
Neutro Abs: 5.2 10*3/uL (ref 1.7–7.7)
Neutrophils Relative %: 65 %
Platelets: 521 10*3/uL — ABNORMAL HIGH (ref 150–400)
RBC: 4.14 MIL/uL (ref 3.87–5.11)
RDW: 23.6 % — ABNORMAL HIGH (ref 11.5–15.5)
WBC: 8.1 10*3/uL (ref 4.0–10.5)
nRBC: 0 % (ref 0.0–0.2)

## 2022-08-04 LAB — COMPREHENSIVE METABOLIC PANEL
ALT: 17 U/L (ref 0–44)
AST: 25 U/L (ref 15–41)
Albumin: 3.5 g/dL (ref 3.5–5.0)
Alkaline Phosphatase: 46 U/L (ref 38–126)
Anion gap: 8 (ref 5–15)
BUN: 14 mg/dL (ref 6–20)
CO2: 23 mmol/L (ref 22–32)
Calcium: 8.9 mg/dL (ref 8.9–10.3)
Chloride: 107 mmol/L (ref 98–111)
Creatinine, Ser: 0.93 mg/dL (ref 0.44–1.00)
GFR, Estimated: >60 mL/min (ref >60)
Glucose, Bld: 101 mg/dL — ABNORMAL HIGH (ref 70–99)
Potassium: 3.9 mmol/L (ref 3.5–5.1)
Sodium: 138 mmol/L (ref 135–145)
Total Bilirubin: 0.6 mg/dL (ref 0.3–1.2)
Total Protein: 7.4 g/dL (ref 6.5–8.1)

## 2022-08-04 LAB — URINALYSIS, ROUTINE W REFLEX MICROSCOPIC
Bilirubin Urine: NEGATIVE
Glucose, UA: NEGATIVE mg/dL
Ketones, ur: NEGATIVE mg/dL
Nitrite: NEGATIVE
Protein, ur: NEGATIVE mg/dL
Specific Gravity, Urine: 1.028 (ref 1.005–1.030)
pH: 5 (ref 5.0–8.0)

## 2022-08-04 MED ORDER — ACETAMINOPHEN 500 MG PO TABS
500.0000 mg | ORAL_TABLET | Freq: Once | ORAL | Status: DC
  Filled 2022-08-04: qty 1

## 2022-08-04 NOTE — Discharge Summary (Addendum)
Physician Discharge Summary   Patient: Valerie Howard MRN: [id number] DOB: [birth year]  Admit date:     07/28/2022  Discharge date: 08/04/22  Discharge Physician: Murray Hodgkins   PCP: Patient, No Pcp Per   Recommendations at discharge:    Menorrhagia with ABLA, currently on Megace  Discharge Diagnoses: Principal Problem:   Acute blood loss anemia Active Problems:   Anemia, iron deficiency   Schizophrenia ([place])   Symptomatic anemia   Fibroids   Acute on chronic blood loss anemia   Menorrhagia   Influenza A  Resolved Problems:   * No resolved hospital problems. *  Hospital Course: 46 year old woman PMH including uterine fibroids, menorrhagia, schizophrenia, paranoid behavior presenting with heavy vaginal bleeding.  Admitted, treated with blood transfusions, Megace and seen by OB/GYN with recommendation for same and outpatient follow-up.  Also found to have the flu.  Seen by psychiatry, no acute inpatient needs.  Hemoglobin stabilized.  Acute on chronic blood loss anemia Iron deficiency microcytic anemia B12 deficiency Menorrhagia from uterine fibroids chronic:  Care complicated by some refusal of medical care.  She refused ultrasound and pelvic exam.  Status post multiple units PRBC with stability.  Has been refusing B12 injections, IV iron. Continue Megace 40 mg twice daily for 30 days and follow-up with GYN. Per Dr. Roselie Awkward if possible she would like to follow-up with [place] which is her old practice-she may be a candidate for uterine artery embolization by IR.   Influenza A  Treated with Tamiflu 5 days   Schizophrenia/paranoid behavior Homelessness: Seen by psychiatry, felt to be stable, no inpatient psychiatric hospitalization warranted.  Consultants:  GYN Psychiatry  Procedures performed:  None   Disposition: Home Diet recommendation:  Regular diet DISCHARGE MEDICATION: Allergies as of 08/04/2022       Reactions   Penicillins  Hives, Itching   Has patient had a PCN reaction causing immediate rash, facial/tongue/throat swelling, SOB or lightheadedness with hypotension:Patient refuses to answer (PRA) Has patient had a PCN reaction causing severe rash involving mucus membranes or skin necrosis:PRA Has patient had a PCN reaction that required hospitalization PRA Has patient had a PCN reaction occurring within the last 10 years:PRA If all of the above answers are "NO", then may proceed with Cephalosporin use.        Medication List     STOP taking these medications    ferrous sulfate 325 (65 FE) MG tablet       TAKE these medications    cyanocobalamin 1000 MCG tablet Take 1 tablet (1,000 mcg total) by mouth daily.   folic acid 1 MG tablet Commonly known as: FOLVITE Take 1 tablet (1 mg total) by mouth daily.   megestrol 40 MG tablet Commonly known as: MEGACE Take 1 tablet (40 mg total) by mouth 2 (two) times daily. What changed: how much to take   Poly-Iron 150 Forte 150-0.025-1 MG Caps Generic drug: Iron Polysacch Cmplx-B12-FA Take 1 capsule (150 mg) by mouth 2 (two) times daily.        Follow-up [facility] Follow up in 1 week(s).   Contact information: [address] [place] [id number] [place], [facility] Ob/Gyn Follow up in 1 week(s).   Contact information: [place]  [place] [address] [phone]                Subjective RN  note overnight Pt refused vitals at this time and started getting irate, RN notified.   Reports dizziness  Seen with RN Autumn  "I'm not going to argue with you."  Discharge Exam: Physical Exam Vitals reviewed. Exam conducted with a chaperone present.  Constitutional:      General: She is not in acute distress.    Appearance: She is not ill-appearing or toxic-appearing.  Neurological:     Mental Status: She is alert.   Psychiatric:     Comments: Speech fluent and clear   RN present Autumn   Condition at discharge: good  The results of significant diagnostics from this hospitalization (including imaging, microbiology, ancillary and laboratory) are listed below for reference.   Imaging Studies: DG Chest Port 1 View  Result Date: 07/30/2022 CLINICAL DATA:  46 year old female with history of cough. EXAM: PORTABLE CHEST 1 VIEW COMPARISON:  Chest x-ray 07/28/2022. FINDINGS: Lung volumes are normal. No consolidative airspace disease. No pleural effusions. No pneumothorax. No pulmonary nodule or mass noted. Pulmonary vasculature and the cardiomediastinal silhouette are within normal limits. IMPRESSION: No radiographic evidence of acute cardiopulmonary disease. Electronically Signed   By: Vinnie Langton M.D.   On: 07/30/2022 09:00   DG Chest 2 View  Result Date: 07/28/2022 CLINICAL DATA:  Fever EXAM: CHEST - 2 VIEW COMPARISON:  Radiograph 11/29/2021 FINDINGS: Unchanged cardiomediastinal silhouette. There is no focal airspace consolidation. There is no pleural effusion or evidence of pneumothorax. There is no acute osseous abnormality. IMPRESSION: No evidence of acute cardiopulmonary disease. Electronically Signed   By: Maurine Simmering M.D.   On: 07/28/2022 14:03    Microbiology: Results for orders placed or performed during the hospital encounter of 07/28/22  SARS Coronavirus 2 by RT PCR (hospital order, performed in [facility] hospital lab) *cepheid single result test* Anterior Nasal Swab     Status: None   Collection Time: 07/28/22  1:04 PM   Specimen: Anterior Nasal Swab  Result Value Ref Range Status   SARS Coronavirus 2 by RT PCR NEGATIVE NEGATIVE Final    Comment: (NOTE) SARS-CoV-2 target nucleic acids are NOT DETECTED.  The SARS-CoV-2 RNA is generally detectable in upper and lower respiratory specimens during the acute phase of infection. The lowest concentration of SARS-CoV-2 viral copies this assay  can detect is 250 copies / mL. A negative result does not preclude SARS-CoV-2 infection and should not be used as the sole basis for treatment or other patient management decisions.  A negative result may occur with improper specimen collection / handling, submission of specimen other than nasopharyngeal swab, presence of viral mutation(s) within the areas targeted by this assay, and inadequate number of viral copies (<250 copies / mL). A negative result must be combined with clinical observations, patient history, and epidemiological information.  Fact Sheet for Patients:   https://www.patel.info/  Fact Sheet for Healthcare Providers: https://hall.com/  This test is not yet approved or  cleared by the [place] FDA and has been authorized for detection and/or diagnosis of SARS-CoV-2 by FDA under an Emergency Use Authorization (EUA).  This EUA will remain in effect (meaning this test can be used) for the duration of the COVID-19 declaration under Section 564(b)(1) of the Act, 21 U.S.C. section 360bbb-3(b)(1), unless the authorization is terminated or revoked sooner.  Performed at [facility], [place] [address] [place], [place] [address]   Respiratory (~20 pathogens) panel by PCR     Status: Abnormal   Collection Time: 07/30/22  7:50 AM  Specimen: Nasopharyngeal Swab; Respiratory  Result Value Ref Range Status   Adenovirus NOT DETECTED NOT DETECTED Final   Coronavirus 229E NOT DETECTED NOT DETECTED Final    Comment: (NOTE) The Coronavirus on the Respiratory Panel, DOES NOT test for the novel  Coronavirus (2019 nCoV)    Coronavirus HKU1 NOT DETECTED NOT DETECTED Final   Coronavirus NL63 NOT DETECTED NOT DETECTED Final   Coronavirus OC43 NOT DETECTED NOT DETECTED Final   Metapneumovirus NOT DETECTED NOT DETECTED Final   Rhinovirus / Enterovirus NOT DETECTED NOT DETECTED Final   Influenza A H1 2009 DETECTED (A) NOT DETECTED  Final   Influenza B NOT DETECTED NOT DETECTED Final   Parainfluenza Virus 1 NOT DETECTED NOT DETECTED Final   Parainfluenza Virus 2 NOT DETECTED NOT DETECTED Final   Parainfluenza Virus 3 NOT DETECTED NOT DETECTED Final   Parainfluenza Virus 4 NOT DETECTED NOT DETECTED Final   Respiratory Syncytial Virus NOT DETECTED NOT DETECTED Final   Bordetella pertussis NOT DETECTED NOT DETECTED Final   Bordetella Parapertussis NOT DETECTED NOT DETECTED Final   Chlamydophila pneumoniae NOT DETECTED NOT DETECTED Final   Mycoplasma pneumoniae NOT DETECTED NOT DETECTED Final    Comment: Performed at [facility], [place]. [address], [place], [place] [address]    Labs: CBC: Recent Labs  Lab 07/28/22 1300 07/28/22 1344 07/29/22 0549 07/29/22 1607 07/30/22 0319 07/31/22 0744 08/02/22 0622 08/03/22 0244  WBC 6.5  --  5.8  --  7.2  --  5.8 6.0  NEUTROABS 5.0  --   --   --   --   --   --   --   HGB 5.2*   < > 6.8* 8.4* 8.4* 9.0* 9.4* 9.1*  HCT 20.0*   < > 23.7* 27.5* 27.2* 30.9* 31.9* 30.8*  MCV 71.7*  --  71.6*  --  72.7*  --  74.7* 75.1*  PLT 265  --  228  --  266  --  354 353   < > = values in this interval not displayed.   Basic Metabolic Panel: Recent Labs  Lab 07/28/22 1300 07/28/22 1344 07/29/22 0549 07/30/22 0319 08/03/22 0244  NA 140 142 138 140 140  K 4.0 3.8 3.6 3.7 3.9  CL 113* 109 109 108 105  CO2 18*  --  '24 24 22  '$ GLUCOSE 113* 118* 96 93 107*  BUN '12 12 10 7 14  '$ CREATININE 0.97 0.90 0.94 0.90 0.83  CALCIUM 8.4*  --  8.1* 8.8* 9.2   Liver Function Tests: Recent Labs  Lab 07/28/22 1300 07/29/22 0549  AST 20 14*  ALT 9 7  ALKPHOS 53 44  BILITOT 0.5 1.3*  PROT 6.7 6.2*  ALBUMIN 3.5 3.1*   CBG: No results for input(s): "GLUCAP" in the last 168 hours.  Discharge time spent: less than 30 minutes.  Signed: Murray Hodgkins, MD [facility] 08/04/2022

## 2022-08-04 NOTE — ED Triage Notes (Signed)
Pt discharged from upstairs earlier after transfusion for vaginal bleeding.  Pt states she feels dizzy/lightheaded when she stands. States she reported this at time of discharge.  Pt states she does not currently have housing so she just came downstairs and checked back in.  No pain. No shob.  No CP

## 2022-08-04 NOTE — ED Notes (Signed)
During discharge patient ripped up paperwork and refused discharge vital signs.

## 2022-08-04 NOTE — ED Notes (Signed)
Patient refused medications states" I don't need that I am not hurting anywhere. I need help with housing."

## 2022-08-04 NOTE — ED Provider Notes (Addendum)
[facility] EMERGENCY DEPARTMENT Provider Note   CSN: [id number] Arrival date & time: 08/04/22  1419     History  Chief Complaint  Patient presents with   Dizziness    Valerie Howard is a 46 y.o. female.   Dizziness    Patient with medical history of anemia, schizoaffective disorder and vaginal bleeding recently admitted due to anemia requiring transfusion discharged this morning presents today due to feeling lightheaded/dizzy.  This happened upon being told she is being discharged home, she felt dizzy when getting from sitting to standing and ambulating across the hallway out of the hospital.  Denies any syncope, chest pain, shortness of breath, vision changes.  She is currently asymptomatic was able to walk to the bed for evaluation without any symptoms.   States she is homeless and has nowhere to go.  Home Medications Prior to Admission medications   Medication Sig Start Date End Date Taking? Authorizing Provider  Iron Polysacch Cmplx-B12-FA (POLY-IRON 150 FORTE) 150-0.025-1 MG CAPS Take 1 capsule (150 mg) by mouth 2 (two) times daily. 08/01/22 08/31/22 Yes Antonieta Pert, MD  megestrol (MEGACE) 40 MG tablet Take 1 tablet (40 mg total) by mouth 2 (two) times daily. 08/02/22 09/03/22 Yes Antonieta Pert, MD  cyanocobalamin 1000 MCG tablet Take 1 tablet (1,000 mcg total) by mouth daily. Patient not taking: Reported on 08/04/2022 08/01/22 08/31/22  Antonieta Pert, MD  folic acid (FOLVITE) 1 MG tablet Take 1 tablet (1 mg total) by mouth daily. Patient not taking: Reported on 08/04/2022 08/01/22 08/31/22  Antonieta Pert, MD      Allergies    Penicillins    Review of Systems   Review of Systems  Neurological:  Positive for dizziness.    Physical Exam Updated Vital Signs BP (!) 141/100 (BP Location: Right Arm)   Pulse 91   Temp 98 F (36.7 C)   Resp 16   LMP 07/25/2022   SpO2 98%  Physical Exam Vitals and nursing note reviewed. Exam conducted with a chaperone present.   Constitutional:      Appearance: Normal appearance.  HENT:     Head: Normocephalic and atraumatic.  Eyes:     General: No scleral icterus.       Right eye: No discharge.        Left eye: No discharge.     Extraocular Movements: Extraocular movements intact.     Pupils: Pupils are equal, round, and reactive to light.  Cardiovascular:     Rate and Rhythm: Normal rate and regular rhythm.     Pulses: Normal pulses.     Heart sounds: Normal heart sounds. No murmur heard.    No friction rub. No gallop.  Pulmonary:     Effort: Pulmonary effort is normal. No respiratory distress.     Breath sounds: Normal breath sounds.  Abdominal:     General: Abdomen is flat. Bowel sounds are normal. There is no distension.     Palpations: Abdomen is soft.     Tenderness: There is no abdominal tenderness.  Skin:    General: Skin is warm and dry.     Coloration: Skin is not jaundiced.  Neurological:     Mental Status: She is alert. Mental status is at baseline.     Coordination: Coordination normal.     ED Results / Procedures / Treatments   Labs (all labs ordered are listed, but only abnormal results are displayed) Labs Reviewed  URINALYSIS, ROUTINE W REFLEX MICROSCOPIC - Abnormal; Notable  for the following components:      Result Value   APPearance HAZY (*)    Hgb urine dipstick LARGE (*)    Leukocytes,Ua TRACE (*)    Bacteria, UA FEW (*)    All other components within normal limits  COMPREHENSIVE METABOLIC PANEL - Abnormal; Notable for the following components:   Glucose, Bld 101 (*)    All other components within normal limits  CBC WITH DIFFERENTIAL/PLATELET - Abnormal; Notable for the following components:   Hemoglobin 9.1 (*)    HCT 31.5 (*)    MCV 76.1 (*)    MCH 22.0 (*)    MCHC 28.9 (*)    RDW 23.6 (*)    Platelets 521 (*)    All other components within normal limits    EKG None  Radiology No results found.  Procedures Procedures    Medications Ordered in  ED Medications - No data to display  ED Course/ Medical Decision Making/ A&P                           Medical Decision Making Risk OTC drugs.   Patient presents due to lightheadedness.  Differential includes but not limited to electrolyte abnormality, symptomatic anemia, arrhythmia, orthostatic, vomiting, underlying infectious etiology.  Physical exam is nonrevealing, patient is overall well-appearing and abdomen is soft nontender.  Afebrile, stable blood pressure. -BP (!) 141/100 (BP Location: Right Arm)   Pulse 91   Temp 98 F (36.7 C)   Resp 16   LMP 07/25/2022   SpO2 98%   I viewed external medical records including patient's recent hospitalization and discharge summary.  She was discharged after being treated and treated to be used from vaginal bleeding and symptomatic anemia.  Hemoglobin stable after transfusion, found to have the flu and was evaluated by psychiatry without any need for acute inpatient treatment.    I ordered and viewed and interpreted laboratory workup.  -CBC with stable hemoglobin of 9.1 which was roughly the same at time of discharge today.  No leukocytosis.   -CMP is without gross electrolyte derangement or AKI.   -Patient has hematuria on UA which is chronic per chart review, trace leukocyturia but no dysuria sidle think they have an underlying UTI.  Blood pressure stable.  EKG with T wave inversion but not grossly changed compared to previous.  Orthostatics likely show an increase in blood pressure from laying down to standing upright as opposed to a drop.  She is asymptomatic during orthostatics.  I considered admission but no indication for admission. Patient was provided resource guide for shelters, she is agreeable to discharge back into the community.   Final Clinical Impression(s) / ED Diagnoses Final diagnoses:  None    Rx / DC Orders ED Discharge Orders     None         [name], PA-C 08/04/22 2225    [name],  PA-C 08/04/22 2246    Noemi Chapel, MD 08/06/22 1201

## 2022-08-04 NOTE — ED Notes (Signed)
Pt reassessed by this RN. Pt was ambulatory with steady gait when called for reassessment. Pt states dizziness is still present. NAD noted at this time.

## 2022-08-04 NOTE — ED Provider Triage Note (Signed)
Emergency Medicine Provider Triage Evaluation Note  Valerie Howard , a 46 y.o. female  was evaluated in triage.  Pt complains of dizziness.  She was recently discharged in the hospital for vaginal bleeding required blood transfusions.  She says that she felt lightheaded so she returned to the emergency department.  She was unwilling to give me further history.  Review of Systems  Positive:  Negative:   Physical Exam  BP (!) 150/94 (BP Location: Right Arm)   Pulse (!) 108   Temp 99.9 F (37.7 C) (Oral)   Resp 16   LMP 07/25/2022   SpO2 100%  Gen:   Awake, no distress   Resp:  Normal effort  MSK:   Moves extremities without difficulty  Other:    Medical Decision Making  Medically screening exam initiated at 3:38 PM.  Appropriate orders placed.  Valerie Howard was informed that the remainder of the evaluation will be completed by another provider, this initial triage assessment does not replace that evaluation, and the importance of remaining in the ED until their evaluation is complete.     Adolphus Birchwood, [place] 08/04/22 1539

## 2022-08-04 NOTE — Discharge Instructions (Addendum)
Establish care with a primary if you do not currently have 1.  Follow-up with gynecology as advised by the hospitalist team at time of discharge.  Return to the ED for new or concerning symptoms.

## 2022-08-29 ENCOUNTER — Emergency Department (HOSPITAL_COMMUNITY): Payer: Medicaid Other

## 2022-08-29 ENCOUNTER — Emergency Department (HOSPITAL_COMMUNITY)
Admission: EM | Admit: 2022-08-29 | Discharge: 2022-08-29 | Disposition: A | Discharge status: Home / Self Care | Payer: Medicaid Other | Source: Home / Self Care | Attending: Emergency Medicine | Admitting: Emergency Medicine

## 2022-08-29 ENCOUNTER — Encounter (HOSPITAL_COMMUNITY): Payer: Self-pay

## 2022-08-29 ENCOUNTER — Emergency Department (HOSPITAL_COMMUNITY)
Admission: EM | Admit: 2022-08-29 | Discharge: 2022-08-29 | Disposition: A | Discharge status: Home / Self Care | Payer: Medicaid Other | Attending: Emergency Medicine | Admitting: Emergency Medicine

## 2022-08-29 ENCOUNTER — Other Ambulatory Visit: Payer: Self-pay

## 2022-08-29 DIAGNOSIS — Z5900 Homelessness unspecified: Secondary | ICD-10-CM | POA: Diagnosis not present

## 2022-08-29 DIAGNOSIS — N9489 Other specified conditions associated with female genital organs and menstrual cycle: Secondary | ICD-10-CM | POA: Diagnosis not present

## 2022-08-29 DIAGNOSIS — N939 Abnormal uterine and vaginal bleeding, unspecified: Secondary | ICD-10-CM

## 2022-08-29 DIAGNOSIS — Z20822 Contact with and (suspected) exposure to covid-19: Secondary | ICD-10-CM | POA: Insufficient documentation

## 2022-08-29 DIAGNOSIS — R052 Subacute cough: Secondary | ICD-10-CM | POA: Insufficient documentation

## 2022-08-29 DIAGNOSIS — Z1152 Encounter for screening for COVID-19: Secondary | ICD-10-CM | POA: Insufficient documentation

## 2022-08-29 DIAGNOSIS — D259 Leiomyoma of uterus, unspecified: Secondary | ICD-10-CM | POA: Diagnosis not present

## 2022-08-29 LAB — CBC WITH DIFFERENTIAL/PLATELET
Abs Immature Granulocytes: 0.03 10*3/uL (ref 0.00–0.07)
Basophils Absolute: 0 10*3/uL (ref 0.0–0.1)
Basophils Relative: 0 %
Eosinophils Absolute: 0.1 10*3/uL (ref 0.0–0.5)
Eosinophils Relative: 1 %
HCT: 29.7 % — ABNORMAL LOW (ref 36.0–46.0)
Hemoglobin: 8.3 g/dL — ABNORMAL LOW (ref 12.0–15.0)
Immature Granulocytes: 0 %
Lymphocytes Relative: 31 %
Lymphs Abs: 2.8 10*3/uL (ref 0.7–4.0)
MCH: 21.1 pg — ABNORMAL LOW (ref 26.0–34.0)
MCHC: 27.9 g/dL — ABNORMAL LOW (ref 30.0–36.0)
MCV: 75.4 fL — ABNORMAL LOW (ref 80.0–100.0)
Monocytes Absolute: 0.5 10*3/uL (ref 0.1–1.0)
Monocytes Relative: 5 %
Neutro Abs: 5.5 10*3/uL (ref 1.7–7.7)
Neutrophils Relative %: 63 %
Platelet Morphology: NORMAL
Platelets: 407 10*3/uL — ABNORMAL HIGH (ref 150–400)
RBC: 3.94 MIL/uL (ref 3.87–5.11)
RDW: 22.2 % — ABNORMAL HIGH (ref 11.5–15.5)
WBC: 8.9 10*3/uL (ref 4.0–10.5)
nRBC: 0 % (ref 0.0–0.2)

## 2022-08-29 LAB — COMPREHENSIVE METABOLIC PANEL
ALT: 11 U/L (ref 0–44)
AST: 30 U/L (ref 15–41)
Albumin: 4 g/dL (ref 3.5–5.0)
Alkaline Phosphatase: 48 U/L (ref 38–126)
Anion gap: 10 (ref 5–15)
BUN: 12 mg/dL (ref 6–20)
CO2: 22 mmol/L (ref 22–32)
Calcium: 8.8 mg/dL — ABNORMAL LOW (ref 8.9–10.3)
Chloride: 106 mmol/L (ref 98–111)
Creatinine, Ser: 0.99 mg/dL (ref 0.44–1.00)
GFR, Estimated: >60 mL/min (ref >60)
Glucose, Bld: 99 mg/dL (ref 70–99)
Potassium: 4.2 mmol/L (ref 3.5–5.1)
Sodium: 138 mmol/L (ref 135–145)
Total Bilirubin: 0.9 mg/dL (ref 0.3–1.2)
Total Protein: 7.5 g/dL (ref 6.5–8.1)

## 2022-08-29 LAB — HEMOGLOBIN AND HEMATOCRIT, BLOOD
HCT: 27.1 % — ABNORMAL LOW (ref 36.0–46.0)
Hemoglobin: 7.6 g/dL — ABNORMAL LOW (ref 12.0–15.0)

## 2022-08-29 LAB — PROTIME-INR
INR: 1.2 (ref 0.8–1.2)
Prothrombin Time: 14.9 seconds (ref 11.4–15.2)

## 2022-08-29 LAB — TYPE AND SCREEN
ABO/RH(D): O POS
Antibody Screen: NEGATIVE

## 2022-08-29 LAB — RESP PANEL BY RT-PCR (RSV, FLU A&B, COVID)  RVPGX2
Influenza A by PCR: NEGATIVE
Influenza B by PCR: NEGATIVE
Resp Syncytial Virus by PCR: NEGATIVE
SARS Coronavirus 2 by RT PCR: NEGATIVE

## 2022-08-29 LAB — I-STAT BETA HCG BLOOD, ED (MC, WL, AP ONLY): I-stat hCG, quantitative: <5 m[IU]/mL (ref <5)

## 2022-08-29 LAB — CBC
HCT: 26.1 % — ABNORMAL LOW (ref 36.0–46.0)
Hemoglobin: 7.5 g/dL — ABNORMAL LOW (ref 12.0–15.0)
MCH: 21.3 pg — ABNORMAL LOW (ref 26.0–34.0)
MCHC: 28.7 g/dL — ABNORMAL LOW (ref 30.0–36.0)
MCV: 74.1 fL — ABNORMAL LOW (ref 80.0–100.0)
Platelets: 314 10*3/uL (ref 150–400)
RBC: 3.52 MIL/uL — ABNORMAL LOW (ref 3.87–5.11)
RDW: 22.4 % — ABNORMAL HIGH (ref 11.5–15.5)
WBC: 7.9 10*3/uL (ref 4.0–10.5)
nRBC: 0 % (ref 0.0–0.2)

## 2022-08-29 MED ORDER — NORETHINDRONE ACETATE 5 MG PO TABS: 10.0000 mg | ORAL_TABLET | Freq: Every day | ORAL | 1 refills | Status: DC

## 2022-08-29 MED ORDER — NORETHINDRONE ACETATE 5 MG PO TABS
10.0000 mg | ORAL_TABLET | Freq: Every day | ORAL | Status: DC
  Filled 2022-08-29: qty 2

## 2022-08-29 MED ORDER — NORETHINDRONE ACETATE 5 MG PO TABS: 10.0000 mg | ORAL_TABLET | Freq: Every day | ORAL | 0 refills | Status: DC

## 2022-08-29 MED ORDER — GUAIFENESIN-DM 100-10 MG/5ML PO SYRP
5.0000 mL | ORAL_SOLUTION | ORAL | Status: DC | PRN
  Administered 2022-08-29: 5 mL via ORAL
  Filled 2022-08-29: qty 10

## 2022-08-29 MED ORDER — MEGESTROL ACETATE 40 MG PO TABS
40.0000 mg | ORAL_TABLET | Freq: Every day | ORAL | Status: DC
  Administered 2022-08-29: 40 mg via ORAL
  Filled 2022-08-29: qty 1

## 2022-08-29 MED ORDER — FERROUS SULFATE 325 (65 FE) MG PO TABS
325.0000 mg | ORAL_TABLET | Freq: Every day | ORAL | Status: DC
  Filled 2022-08-29: qty 1

## 2022-08-29 MED ORDER — POLYETHYLENE GLYCOL 3350 17 G PO PACK: 17.0000 g | PACK | Freq: Every day | ORAL | 0 refills | Status: DC

## 2022-08-29 MED ORDER — FERROUS SULFATE 325 (65 FE) MG PO TABS: 325.0000 mg | ORAL_TABLET | Freq: Every day | ORAL | 0 refills | Status: DC

## 2022-08-29 MED ORDER — MEGESTROL ACETATE 20 MG PO TABS
20.0000 mg | ORAL_TABLET | Freq: Three times a day (TID) | ORAL | 0 refills | Status: DC
  Filled 2022-08-29: qty 21, 7d supply, fill #0

## 2022-08-29 MED ORDER — MEGESTROL ACETATE 20 MG PO TABS: 20.0000 mg | ORAL_TABLET | Freq: Three times a day (TID) | ORAL | 0 refills | Status: AC

## 2022-08-29 MED ORDER — PREDNISONE 10 MG PO TABS
40.0000 mg | ORAL_TABLET | Freq: Every day | ORAL | 0 refills | Status: AC
  Filled 2022-08-29: qty 20, 5d supply, fill #0

## 2022-08-29 MED ORDER — BENZONATATE 100 MG PO CAPS
100.0000 mg | ORAL_CAPSULE | Freq: Three times a day (TID) | ORAL | 0 refills | Status: DC
  Filled 2022-08-29: qty 21, 7d supply, fill #0

## 2022-08-29 NOTE — ED Notes (Signed)
Pt given taxi voucher

## 2022-08-29 NOTE — ED Triage Notes (Signed)
Pt BIB EMS from a homeless shelter. Pt c/o vaginal bleeding, per EMS pt would not give time frame on bleeding. Pt seen and admitted at [facility] 12/7 for same complaint. EMS reports pt refused to allow EMS to listen to lungs, noncompliant with assessment.  132/86 111 CBG 98 HR 98% RA

## 2022-08-29 NOTE — Discharge Instructions (Addendum)
You were seen for your vaginal bleeding in the emergency department.   At home, please take the iron and MiraLAX for your anemia.  Please take the Aygestin we have prescribed you once a day for your vaginal bleeding.  Stop taking the Megace that you were taking before.    Check your MyChart online for the results of any tests that had not resulted by the time you left the emergency department.   Follow-up with your primary doctor in 2-3 days regarding your visit.    Return immediately to the emergency department if you experience any of the following: Fainting, chest pain, shortness of breath, or any other concerning symptoms.   Thank you for visiting our Emergency Department. It was a pleasure taking care of you today.

## 2022-08-29 NOTE — Consult Note (Signed)
   OB/GYN Telephone Consult  Valerie Howard is a 47 y.o. with longstanding history of AUB who presented to the ED today via EMS for vaginal bleeding.    I was called for a consult regarding the care of this patient by [facility]                                           .    The provider had a clinical question regarding management and follow up options.   The provider presented the following relevant clinical information and I performed a chart review on the patient and reviewed available documentation:  Reports that patient has been on megace in the past. She is currently experiencing homelessness so has not been able to follow up as an outpatient.  Patient declined a pelvic exam or  pelvic ultrasound.  Initial vital signs in ED tachycardic to 120bpm, BP 164/88 Lab reviewed - Anemic w/ Hgb 8.3 (previously 9.1 on 08/04/22), no leukocytosis (WBC 8.9), mild thrombocytosis (407), INR 1.2, BhCG negative  I reviewed her pelvic ultrasound from 2022 - 15.9 x 9.2 x 13.3cm uterus w/ intramural uterine masses with largest measuring 9.5 x 9.3 x 8.7cm that had increased in size from prior imaging. Ovaries non-visualized.   Unfortunately, the patient has been scheduled with [place] for ED follow up in the past but has not been able to attend her appointments.  I reviewed CareEverywhere and there was no pertinent documentation/labs/imaging for review.   BP (!) 164/88 (BP Location: Right Arm)   Pulse (!) 120   Temp 99.9 F (37.7 C) (Oral)   Resp 16   Ht '5\' 7"'$  (1.702 m)   Wt 92.5 kg   LMP 07/25/2022   SpO2 97%   BMI 31.95 kg/m   Exam- performed by consulting provider. Patient declined pelvic exam  Recommendations:  -Aygestin (norethindrone) '10mg'$  daily x 30 days with refills for bleeding control -Recommended MD/APP provide the patient with a referral to the [facility] [place] (any office) for follow up in   1-4 weeks .  -Please confirm contact information. I will send a message  to our office to facilitate appointment.  Thank you for this consult and if additional recommendations are needed please call [phone] for the OB/GYN attending on service at [facility].   I spent approximately 5 minutes directly consulting with the provider and verbally discussing this case. Additionally 10 minutes minutes was spent performing chart review and documentation.   Gale Journey, MD Attending [place], [facility] for [facility], [place] for phone consult billing? (If answer to any of these are yes then you cannot bill this telephone consult) Will the patient be seen urgently (within 24hrs) at a [facility] practice? No Is this a patient on which I performed surgery within the last 7d? No Have you billed a telephone consult on this patient in the last 7d? No

## 2022-08-29 NOTE — ED Provider Triage Note (Signed)
Emergency Medicine Provider Triage Evaluation Note  Valerie Howard , a 47 y.o. female  was evaluated in triage.  Pt complains of vaginal bleeding.  She has had history of the same previously but states that started a week ago.  She is unsure if Megace made any difference.  Denies any pain anywhere.  Not on blood thinners..  Review of Systems  Per HPI  Physical Exam  BP (!) 164/88 (BP Location: Right Arm)   Pulse (!) 120   Temp 99.9 F (37.7 C) (Oral)   Resp 16   Ht '5\' 7"'$  (1.702 m)   Wt 92.5 kg   LMP 07/25/2022   SpO2 97%   BMI 31.95 kg/m  Gen:   Awake, no distress.  Bizarre affect Resp:  Normal effort  MSK:   Moves extremities without difficulty  Other:  Abdomen is nontender, slightly tachycardic  Medical Decision Making  Medically screening exam initiated at 8:21 AM.  Appropriate orders placed.  Valerie Howard was informed that the remainder of the evaluation will be completed by another provider, this initial triage assessment does not replace that evaluation, and the importance of remaining in the ED until their evaluation is complete.     Valerie Howard, [place] 08/29/22 [phone]

## 2022-08-29 NOTE — ED Provider Notes (Addendum)
[facility] DEPT Provider Note   CSN: [id number] Arrival date & time: 08/29/22  1753     History  Chief Complaint  Patient presents with   Vaginal Bleeding    Valerie Howard is a 47 y.o. female.   Vaginal Bleeding    47 year old female with medical history significant for uterine fibroids and abnormal vaginal bleeding who was seen earlier in the emergency department today for the same complaint who presents with vaginal bleeding.  The patient is currently homeless and resides at a shelter.  She has not followed up outpatient for her intrauterine masses and history of vaginal bleeding.  She has a history of tubal ligation.  OB/GYN was consulted during the patient's previous ED visit earlier today.  She had refused pelvic ultrasound and pelvic exam.  She was discharged with plan for Aygestin 10 mg daily for 30 days with refills for bleeding control, [facility] for follow-up in 1 to 4 weeks.   She presents for continued ongoing bleeding.  She request that the bleeding stopped.  She has not picked up her medications that were prescribed for her.  Declines pelvic exam and pelvic ultrasound to further evaluate as she had earlier today.  Home Medications Prior to Admission medications   Medication Sig Start Date End Date Taking? Authorizing Provider  benzonatate (TESSALON) 100 MG capsule Take 1 capsule (100 mg total) by mouth every 8 (eight) hours. 08/29/22  Yes Regan Lemming, MD  predniSONE (DELTASONE) 10 MG tablet Take 4 tablets (40 mg total) by mouth daily for 5 days. 08/29/22 09/03/22 Yes Regan Lemming, MD  cyanocobalamin 1000 MCG tablet Take 1 tablet (1,000 mcg total) by mouth daily. Patient not taking: Reported on 08/04/2022 08/01/22 08/31/22  Antonieta Pert, MD  folic acid (FOLVITE) 1 MG tablet Take 1 tablet (1 mg total) by mouth daily. Patient not taking: Reported on 08/04/2022 08/01/22 08/31/22  Antonieta Pert, MD  Iron Polysacch  Cmplx-B12-FA (POLY-IRON 150 FORTE) 150-0.025-1 MG CAPS Take 1 capsule (150 mg) by mouth 2 (two) times daily. 08/01/22 08/31/22  Antonieta Pert, MD  megestrol (MEGACE) 20 MG tablet Take 1 tablet (20 mg total) by mouth in the morning, at noon, and at bedtime for 7 days. 08/29/22 09/05/22  Regan Lemming, MD  polyethylene glycol (MIRALAX) 17 g packet Take 17 g by mouth daily. 08/29/22   Fransico Meadow, MD      Allergies    Penicillins    Review of Systems   Review of Systems  Genitourinary:  Positive for vaginal bleeding.  All other systems reviewed and are negative.   Physical Exam Updated Vital Signs BP (!) 157/76   Pulse (!) 102   Temp 98.6 F (37 C) (Oral)   Resp 18   LMP 07/25/2022   SpO2 98%  Physical Exam Vitals and nursing note reviewed.  Constitutional:      General: She is not in acute distress. HENT:     Head: Normocephalic and atraumatic.  Eyes:     Conjunctiva/sclera: Conjunctivae normal.     Pupils: Pupils are equal, round, and reactive to light.  Cardiovascular:     Rate and Rhythm: Normal rate and regular rhythm.  Pulmonary:     Effort: Pulmonary effort is normal. No respiratory distress.  Abdominal:     General: There is no distension.     Tenderness: There is no abdominal tenderness. There is no guarding.  Genitourinary:    Comments: Pt declines  exam Musculoskeletal:        General: No deformity or signs of injury.     Cervical back: Neck supple.  Skin:    Findings: No lesion or rash.  Neurological:     General: No focal deficit present.     Mental Status: She is alert. Mental status is at baseline.     ED Results / Procedures / Treatments   Labs (all labs ordered are listed, but only abnormal results are displayed) Labs Reviewed  RESP PANEL BY RT-PCR (RSV, FLU A&B, COVID)  [id number]    EKG None  Radiology DG Chest Portable 1 View  Result Date: 08/29/2022 CLINICAL DATA:  Nonproductive cough for 2 months EXAM: PORTABLE CHEST 1 VIEW COMPARISON:   07/30/2022 FINDINGS: The heart size and mediastinal contours are within normal limits. Both lungs are clear. The visualized skeletal structures are unremarkable. IMPRESSION: No active disease. Electronically Signed   By: Inez Catalina M.D.   On: 08/29/2022 20:34    Procedures Procedures    Medications Ordered in ED Medications  megestrol (MEGACE) tablet 40 mg (40 mg Oral Given 08/29/22 2010)  guaiFENesin-dextromethorphan (ROBITUSSIN DM) 100-10 MG/5ML syrup 5 mL (has no administration in time range)    ED Course/ Medical Decision Making/ A&P                           Medical Decision Making Amount and/or Complexity of Data Reviewed Radiology: ordered.  Risk OTC drugs. Prescription drug management.   47 year old female with medical history significant for uterine fibroids and abnormal vaginal bleeding who was seen earlier in the emergency department today for the same complaint who presents with vaginal bleeding.  The patient is currently homeless and resides at a shelter.  She has not followed up outpatient for her intrauterine masses and history of vaginal bleeding.  She has a history of tubal ligation.  OB/GYN was consulted during the patient's previous ED visit earlier today.  She had refused pelvic ultrasound and pelvic exam.  She was discharged with plan for Aygestin 10 mg daily for 30 days with refills for bleeding control, [facility] for follow-up in 1 to 4 weeks.   She presents for continued ongoing bleeding.  She request that the bleeding stopped.  She has not picked up her medications that were prescribed for her.  Declines pelvic exam and pelvic ultrasound to further evaluate as she had earlier today.  Laboratory evaluation reviewed from earlier today significant for hemoglobin of 7.5, decreased from 9.1 3 weeks ago.  hCG normal, repeat H&H at 1406 today had revealed a stable hemoglobin of 7.6.  TOC consult placed to assist the patient in  obtaining her medications.  Patient previously underwent OB/GYN consultation with recommendations as per below:  -Aygestin (norethindrone) '10mg'$  daily x 30 days with refills for bleeding control -Recommended MD/APP provide the patient with a referral to the [facility] [place] (any office) for follow up in   1-4 weeks .  -Please confirm contact information. I will send a message to our office to facilitate appointment.  The patient was administered an iron tablet and a dose of her prescribed Aygestin from her visit earlier today.  Her medications were sent as prescriptions to the [facility] outpatient pharmacy in coordination with the [facility] on-call.  She is homeless and without transportation.  TOC consult placed to assist the patient in obtaining her medications and with her  homelessness and transportation needs.  Update: The patient refused Aygestin, instead preferring Megace which has worked for her in the past. Will provide a dose tonight and have the patient pick up a prescription tomorrow at the pharmacy of her choice, written script provided.  Update: Pt states she has had a mildly productive cough since her diagnosis of Influenza in early December. Her labs earlier today revealed no leukocytosis. Will obtain a CXR and COVID, Flu, and RSV testing. Her lungs are CTAB.   Chest x-ray was obtained and was negative for evidence of bacterial pneumonia the patient has no leukocytosis.  Her COVID-19, influenza and RSV PCR testing was collected and resulted negative.  Will treat for acute bronchitis with a course of steroids and Tessalon for symptomatic management.  Stable for discharge.    Final Clinical Impression(s) / ED Diagnoses Final diagnoses:  Vaginal bleeding  Subacute cough    Rx / DC Orders ED Discharge Orders          Ordered    ferrous sulfate 325 (65 FE) MG tablet  Daily,   Status:  Discontinued        08/29/22 1912    norethindrone (AYGESTIN) 5 MG tablet   Daily,   Status:  Discontinued        08/29/22 1912    megestrol (MEGACE) 20 MG tablet  3 times daily,   Status:  Discontinued        08/29/22 2002    megestrol (MEGACE) 20 MG tablet  3 times daily        08/29/22 2003    benzonatate (TESSALON) 100 MG capsule  Every 8 hours        08/29/22 2113    predniSONE (DELTASONE) 10 MG tablet  Daily        08/29/22 2114                  Regan Lemming, MD 08/29/22 [place] Course    Regan Lemming, MD 08/29/22 2114

## 2022-08-29 NOTE — Discharge Instructions (Addendum)
You were initially prescribed Aygestin which you refused, preferring Megace. Will treat with Megace '20mg'$  three times daily for 7 days.  Those prescriptions have been sent to the [facility] outpatient pharmacy to be picked up tomorrow.  Please schedule an appointment to [facility] at [facility] for further outpatient management.

## 2022-08-29 NOTE — ED Provider Notes (Signed)
[facility] DEPT Provider Note   CSN: [id number] Arrival date & time: 08/29/22  0757     History  Chief Complaint  Patient presents with   Vaginal Bleeding    Valerie Howard is a 47 y.o. female.  47 year old female with a history of uterine fibroids and abnormal vaginal bleeding who presents to the emergency department with persistent vaginal bleeding.  Patient states that she has been going through multiple pads per day (unable to quantify) recently.  Was hospitalized on 08/04/2022 requiring transfusion and was started on megestrol acetate which she has been taking inconsistently and reports that the bleeding has persisted.  Says that she has minimal dizziness but no chest pain or shortness of breath.  No syncope.  Had an ultrasound on 12/2020 that showed several intrauterine masses.  Reports that she had an ultrasound afterwards that I am unable to view but is unsure of the results.  Not sexually active in the past years and no vaginal discharge otherwise.  Did have a tubal ligation.  When discharged was instructed to follow-up with the [facility] for evaluation of possible uterine artery embolization.  Patient declines pelvic and ultrasound today.  Does not smoke.  No history of DVT or PE.     Home Medications Prior to Admission medications   Medication Sig Start Date End Date Taking? Authorizing Provider  polyethylene glycol (MIRALAX) 17 g packet Take 17 g by mouth daily. 08/29/22  Yes Fransico Meadow, MD  benzonatate (TESSALON) 100 MG capsule Take 1 capsule (100 mg total) by mouth every 8 (eight) hours. 08/29/22   Regan Lemming, MD  cyanocobalamin 1000 MCG tablet Take 1 tablet (1,000 mcg total) by mouth daily. Patient not taking: Reported on 08/04/2022 08/01/22 08/31/22  Antonieta Pert, MD  folic acid (FOLVITE) 1 MG tablet Take 1 tablet (1 mg total) by mouth daily. Patient not taking: Reported on 08/04/2022 08/01/22 08/31/22  Antonieta Pert, MD   Iron Polysacch Cmplx-B12-FA (POLY-IRON 150 FORTE) 150-0.025-1 MG CAPS Take 1 capsule (150 mg) by mouth 2 (two) times daily. 08/01/22 08/31/22  Antonieta Pert, MD  megestrol (MEGACE) 20 MG tablet Take 1 tablet (20 mg total) by mouth in the morning, at noon, and at bedtime for 7 days. 08/29/22 09/05/22  Regan Lemming, MD  predniSONE (DELTASONE) 10 MG tablet Take 4 tablets (40 mg total) by mouth daily for 5 days. 08/29/22 09/03/22  Regan Lemming, MD      Allergies    Penicillins    Review of Systems   Review of Systems  Physical Exam Updated Vital Signs BP 132/81   Pulse (!) 101   Temp 98.8 F (37.1 C)   Resp 18   Ht '5\' 7"'$  (1.702 m)   Wt 92.5 kg   LMP 07/25/2022   SpO2 99%   BMI 31.95 kg/m  Physical Exam Vitals and nursing note reviewed.  Constitutional:      General: She is not in acute distress.    Appearance: She is well-developed.  HENT:     Head: Normocephalic and atraumatic.     Right Ear: External ear normal.     Left Ear: External ear normal.     Nose: Nose normal.  Eyes:     Extraocular Movements: Extraocular movements intact.     Conjunctiva/sclera: Conjunctivae normal.     Pupils: Pupils are equal, round, and reactive to light.     Comments: Mild subconjunctival pallor  Cardiovascular:     Rate and  Rhythm: Normal rate and regular rhythm.     Heart sounds: No murmur heard.    Comments: Patient refusing to be put on monitor Pulmonary:     Effort: Pulmonary effort is normal. No respiratory distress.     Breath sounds: Normal breath sounds.  Abdominal:     General: Abdomen is flat. There is no distension.     Palpations: Abdomen is soft. There is mass (And lower abdomen palpated.  Nontender).     Tenderness: There is no abdominal tenderness. There is no guarding.  Musculoskeletal:     Cervical back: Normal range of motion and neck supple.  Skin:    General: Skin is warm and dry.  Neurological:     Mental Status: She is alert and oriented to person, place, and time.  Mental status is at baseline.  Psychiatric:     Comments: Pressured speech noted     ED Results / Procedures / Treatments   Labs (all labs ordered are listed, but only abnormal results are displayed) Labs Reviewed  COMPREHENSIVE METABOLIC PANEL - Abnormal; Notable for the following components:      Result Value   Calcium 8.8 (*)    All other components within normal limits  CBC WITH DIFFERENTIAL/PLATELET - Abnormal; Notable for the following components:   Hemoglobin 8.3 (*)    HCT 29.7 (*)    MCV 75.4 (*)    MCH 21.1 (*)    MCHC 27.9 (*)    RDW 22.2 (*)    Platelets 407 (*)    All other components within normal limits  CBC - Abnormal; Notable for the following components:   RBC 3.52 (*)    Hemoglobin 7.5 (*)    HCT 26.1 (*)    MCV 74.1 (*)    MCH 21.3 (*)    MCHC 28.7 (*)    RDW 22.4 (*)    All other components within normal limits  HEMOGLOBIN AND HEMATOCRIT, BLOOD - Abnormal; Notable for the following components:   Hemoglobin 7.6 (*)    HCT 27.1 (*)    All other components within normal limits  PROTIME-INR  I-STAT BETA HCG BLOOD, ED ([facility], [facility], [facility] ONLY)  TYPE AND SCREEN    EKG None  Radiology DG Chest Portable 1 View  Result Date: 08/29/2022 CLINICAL DATA:  Nonproductive cough for 2 months EXAM: PORTABLE CHEST 1 VIEW COMPARISON:  07/30/2022 FINDINGS: The heart size and mediastinal contours are within normal limits. Both lungs are clear. The visualized skeletal structures are unremarkable. IMPRESSION: No active disease. Electronically Signed   By: Inez Catalina M.D.   On: 08/29/2022 20:34    Procedures Procedures   Medications Ordered in ED Medications - No data to display  ED Course/ Medical Decision Making/ A&P Clinical Course as of 08/30/22 0612  Mon Aug 29, 2022  1048 Dr Mikel Cella recommends starting aygestin 10 mg daily indefinitely. New gyn ed fu to be scheduled by obgyn.  [RP]    Clinical Course User Index [RP] Fransico Meadow, MD                            Medical Decision Making Amount and/or Complexity of Data Reviewed Labs: ordered.   Valerie Howard is a 47 y.o. female with comorbidities that complicate the patient evaluation including homelessness and uterine fibroids and vaginal bleeding who presents with vaginal bleeding  Initial Ddx:  Symptomatic anemia, bleeding fibroids, malignancy  MDM:  Feel  the patient likely has persistent vaginal bleeding from her fibroids.  Reports being compliant with her megestrol acetate so we will obtain labs and reach out to OB/GYN for additional recommendations.  Patient does have mass palpated in her abdomen but she is declining a pelvic exam and ultrasound today so I am unable to rule out possible malignancy especially with patient's prior ultrasound. Will also consult OB/GYN coordinating care since she is homeless.  Plan:  Labs OB/GYN consult  ED Summary/Re-evaluation:  Patient reevaluated and her sinus tachycardia improved.  Blood pressure remained stable.  She declined changing pads or liners so I am unable to tell the exact amount of bleeding that she is having.  Did obtain serial CBCs with did not show slight decline in her hemoglobin but that remained stable.  After talking OB/GYN they would like for her to be started on Aygestin indefinitely.  With her anemia we will also start her on iron and MiraLAX prevent constipation.  Patient was given the option to go to an appointment that could be speech scheduled for her on 1/5.  However, she declined and stated that she wanted to find her own OB/GYN.  Did give her resources for several clinics around town.  Return precautions discussed prior to discharge.  This patient presents to the ED for concern of complaints listed in HPI, this involves an extensive number of treatment options, and is a complaint that carries with it a high risk of complications and morbidity. Disposition including potential need for admission considered.   Dispo: DC Home.  Return precautions discussed including, but not limited to, those listed in the AVS. Allowed pt time to ask questions which were answered fully prior to dc.  Records reviewed ED Visit Notes The following labs were independently interpreted: CBC and show chronic anemia I personally reviewed and interpreted cardiac monitoring: normal sinus rhythm  and sinus tachycardia I have reviewed the patients home medications and made adjustments as needed Consults: OBGYN Social Determinants of health:  Homelessness  Final Clinical Impression(s) / ED Diagnoses Final diagnoses:  Vaginal bleeding  Uterine leiomyoma, unspecified location    Rx / DC Orders ED Discharge Orders          Ordered    norethindrone (AYGESTIN) 5 MG tablet  Daily,   Status:  Discontinued        08/29/22 1503    ferrous sulfate 325 (65 FE) MG tablet  Daily,   Status:  Discontinued        08/29/22 1503    polyethylene glycol (MIRALAX) 17 g packet  Daily        08/29/22 1503              Fransico Meadow, MD 08/30/22 [phone]

## 2022-08-29 NOTE — ED Triage Notes (Signed)
[place]. Pt seen here at [facility] ED and discharged today. Pt refused all care and assessments from EMS. Pt c/o vaginal bleeding, refusing to give time frame on how long bleeding has been going on.

## 2022-08-30 ENCOUNTER — Encounter (HOSPITAL_COMMUNITY): Payer: Self-pay

## 2022-08-30 ENCOUNTER — Emergency Department (HOSPITAL_COMMUNITY)
Admission: EM | Admit: 2022-08-30 | Discharge: 2022-08-31 | Disposition: A | Discharge status: Home / Self Care | Payer: Medicaid Other | Attending: Emergency Medicine | Admitting: Emergency Medicine

## 2022-08-30 ENCOUNTER — Other Ambulatory Visit: Payer: Self-pay

## 2022-08-30 DIAGNOSIS — N938 Other specified abnormal uterine and vaginal bleeding: Secondary | ICD-10-CM | POA: Insufficient documentation

## 2022-08-30 DIAGNOSIS — R051 Acute cough: Secondary | ICD-10-CM | POA: Diagnosis present

## 2022-08-30 DIAGNOSIS — N939 Abnormal uterine and vaginal bleeding, unspecified: Secondary | ICD-10-CM

## 2022-08-30 LAB — CBC WITH DIFFERENTIAL/PLATELET
Abs Immature Granulocytes: 0.02 10*3/uL (ref 0.00–0.07)
Basophils Absolute: 0 10*3/uL (ref 0.0–0.1)
Basophils Relative: 0 %
Eosinophils Absolute: 0.1 10*3/uL (ref 0.0–0.5)
Eosinophils Relative: 1 %
HCT: 27.2 % — ABNORMAL LOW (ref 36.0–46.0)
Hemoglobin: 7.9 g/dL — ABNORMAL LOW (ref 12.0–15.0)
Immature Granulocytes: 0 %
Lymphocytes Relative: 35 %
Lymphs Abs: 2.9 10*3/uL (ref 0.7–4.0)
MCH: 21.9 pg — ABNORMAL LOW (ref 26.0–34.0)
MCHC: 29 g/dL — ABNORMAL LOW (ref 30.0–36.0)
MCV: 75.6 fL — ABNORMAL LOW (ref 80.0–100.0)
Monocytes Absolute: 0.5 10*3/uL (ref 0.1–1.0)
Monocytes Relative: 6 %
Neutro Abs: 4.7 10*3/uL (ref 1.7–7.7)
Neutrophils Relative %: 58 %
Platelets: 382 10*3/uL (ref 150–400)
RBC: 3.6 MIL/uL — ABNORMAL LOW (ref 3.87–5.11)
RDW: 21.7 % — ABNORMAL HIGH (ref 11.5–15.5)
WBC: 8.2 10*3/uL (ref 4.0–10.5)
nRBC: 0 % (ref 0.0–0.2)

## 2022-08-30 LAB — BASIC METABOLIC PANEL
Anion gap: 7 (ref 5–15)
BUN: 11 mg/dL (ref 6–20)
CO2: 21 mmol/L — ABNORMAL LOW (ref 22–32)
Calcium: 8.7 mg/dL — ABNORMAL LOW (ref 8.9–10.3)
Chloride: 108 mmol/L (ref 98–111)
Creatinine, Ser: 0.84 mg/dL (ref 0.44–1.00)
GFR, Estimated: >60 mL/min (ref >60)
Glucose, Bld: 91 mg/dL (ref 70–99)
Potassium: 3.9 mmol/L (ref 3.5–5.1)
Sodium: 136 mmol/L (ref 135–145)

## 2022-08-30 NOTE — ED Provider Triage Note (Addendum)
Emergency Medicine Provider Triage Evaluation Note  Valerie Howard , a 47 y.o. female  was evaluated in triage.  Pt complains of vaginal bleeding and cough.  Patient was recently seen in multiple ERs over the last several days for similar concerns and has been worked up for these conditions.  She clarified to me that she is not coughing up blood but is coughing and having vaginal discharge and blood.  No prior history of uterine fibroids.  Patient is concerned about having a pelvic exam performed.  Review of Systems  Positive: As above Negative: As above  Physical Exam  LMP 07/25/2022  Gen:   Awake, no distress, somewhat paranoid Resp:  Normal effort  MSK:   Moves extremities without difficulty  Other:    Medical Decision Making  Medically screening exam initiated at 9:09 PM.  Appropriate orders placed.  Valerie Howard was informed that the remainder of the evaluation will be completed by another provider, this initial triage assessment does not replace that evaluation, and the importance of remaining in the ED until their evaluation is complete.    Luvenia Heller, PA-C 08/30/22 2113

## 2022-08-30 NOTE — ED Triage Notes (Signed)
Reports ongoing cough x 1 month that caused vaginal bleeding. Says that occurred from 11/30-12/7.

## 2022-08-31 ENCOUNTER — Other Ambulatory Visit (HOSPITAL_COMMUNITY): Payer: Self-pay

## 2022-08-31 MED ORDER — BENZONATATE 100 MG PO CAPS: 100.0000 mg | ORAL_CAPSULE | Freq: Three times a day (TID) | ORAL | 0 refills | Status: DC

## 2022-08-31 NOTE — ED Provider Notes (Signed)
[facility] DE[facility]MENT Provider Note   CSN: [id number] Arrival date & time: 08/30/22  1948     History Chief Complaint  Patient presents with   Cough    HPI Valerie Howard is a 47 y.o. female presenting for multiple complaints. Primarily, she endorses 9 days of intermittent vaginal bleeding.  Was admitted for profound vaginal bleeding 2 months ago.  Was started on Megace which she is still taking at this time.  Was consulted with gynecology 48 hours ago who recommended outpatient follow-up given stable hemoglobins.  She still having intermittent vaginal bleeding dark blood especially when she coughs.  She denies fevers chills nausea vomiting syncope shortness of breath.  She is otherwise ambulatory.  Has not called to schedule gynecology follow-up because she "does not have a driver's license".   Patient's recorded medical, surgical, social, medication list and allergies were reviewed in the Snapshot window as part of the initial history.   Review of Systems   Review of Systems  Constitutional:  Negative for chills and fever.  HENT:  Negative for ear pain and sore throat.   Eyes:  Negative for pain and visual disturbance.  Respiratory:  Negative for cough and shortness of breath.   Cardiovascular:  Negative for chest pain and palpitations.  Gastrointestinal:  Negative for abdominal pain and vomiting.  Genitourinary:  Positive for vaginal bleeding. Negative for dysuria, hematuria and pelvic pain.  Musculoskeletal:  Negative for arthralgias and back pain.  Skin:  Negative for color change and rash.  Neurological:  Negative for seizures and syncope.  All other systems reviewed and are negative.   Physical Exam Updated Vital Signs BP (!) 149/104   Pulse 99   Temp 98.7 F (37.1 C) (Oral)   Resp 16   LMP 07/25/2022   SpO2 99%  Physical Exam Vitals and nursing note reviewed.  Constitutional:      General: She is not in acute distress.     Appearance: She is well-developed.  HENT:     Head: Normocephalic and atraumatic.  Eyes:     Conjunctiva/sclera: Conjunctivae normal.  Cardiovascular:     Rate and Rhythm: Normal rate and regular rhythm.     Heart sounds: No murmur heard. Pulmonary:     Effort: Pulmonary effort is normal. No respiratory distress.     Breath sounds: Normal breath sounds.  Abdominal:     Palpations: Abdomen is soft.     Tenderness: There is no abdominal tenderness.  Genitourinary:    Comments: Patient deferred due to being in a hall bed and not wanting to continue waiting for a full bed. Musculoskeletal:        General: No swelling.     Cervical back: Neck supple.  Skin:    General: Skin is warm and dry.     Capillary Refill: Capillary refill takes less than 2 seconds.  Neurological:     Mental Status: She is alert.  Psychiatric:        Mood and Affect: Mood normal.      ED Course/ Medical Decision Making/ A&P    Procedures Procedures   Medications Ordered in ED Medications - No data to display  Medical Decision Making:    BO ROGUE is a 47 y.o. female who presented to the ED today with intermittent vaginal bleeding detailed above.     Patient's presentation is complicated by their history of psychiatric disease, extensive evaluations for similar, longstanding medication prescriptions and complex outpatient medication  regimens for similar therapies.  History of anemia..  Patient placed on continuous vitals and telemetry monitoring while in ED which was reviewed periodically.   Complete initial physical exam performed, notably the patient  was hemodynamically stable in no acute distress.      Reviewed and confirmed nursing documentation for past medical history, family history, social history.    Initial Assessment:   Patient continues to decline further diagnostic evaluation similar to prior evaluations.  She needs to follow-up with a gynecologist and stay compliant on her  medications. She historically has refused to see local resources for her gynecologic care. No evidence of severe anemia on repeat CBC and patient hemodynamically stable no acute distress stable for outpatient care at this time. Disposition:  I have considered need for hospitalization, however, considering all of the above, I believe this patient is stable for discharge at this time.  Patient/family educated about specific return precautions for given chief complaint and symptoms.  Patient/family educated about follow-up with PC/GYN.     Patient/family expressed understanding of return precautions and need for follow-up. Patient spoken to regarding all imaging and laboratory results and appropriate follow up for these results. All education provided in verbal form with additional information in written form. Time was allowed for answering of patient questions. Patient discharged.    Emergency Department Medication Summary:   Medications - No data to display      Clinical Impression: No diagnosis found.   Data Unavailable   Final Clinical Impression(s) / ED Diagnoses Final diagnoses:  None    Rx / DC Orders ED Discharge Orders     None         Tretha Sciara, MD 08/31/22 1615

## 2022-09-07 ENCOUNTER — Other Ambulatory Visit: Payer: Self-pay

## 2022-09-07 ENCOUNTER — Encounter (HOSPITAL_COMMUNITY): Payer: Self-pay

## 2022-09-07 ENCOUNTER — Inpatient Hospital Stay (HOSPITAL_COMMUNITY)
Admission: EM | Admit: 2022-09-07 | Discharge: 2022-09-12 | DRG: 760 | Disposition: A | Discharge status: Home / Self Care | Payer: Medicaid Other | Attending: Internal Medicine | Admitting: Internal Medicine

## 2022-09-07 DIAGNOSIS — Z880 Allergy status to penicillin: Secondary | ICD-10-CM

## 2022-09-07 DIAGNOSIS — Z5900 Homelessness unspecified: Secondary | ICD-10-CM

## 2022-09-07 DIAGNOSIS — Z803 Family history of malignant neoplasm of breast: Secondary | ICD-10-CM

## 2022-09-07 DIAGNOSIS — E876 Hypokalemia: Secondary | ICD-10-CM | POA: Diagnosis present

## 2022-09-07 DIAGNOSIS — R451 Restlessness and agitation: Secondary | ICD-10-CM | POA: Diagnosis not present

## 2022-09-07 DIAGNOSIS — Z1152 Encounter for screening for COVID-19: Secondary | ICD-10-CM

## 2022-09-07 DIAGNOSIS — Z79818 Long term (current) use of other agents affecting estrogen receptors and estrogen levels: Secondary | ICD-10-CM

## 2022-09-07 DIAGNOSIS — D259 Leiomyoma of uterus, unspecified: Principal | ICD-10-CM | POA: Diagnosis present

## 2022-09-07 DIAGNOSIS — N920 Excessive and frequent menstruation with regular cycle: Secondary | ICD-10-CM | POA: Diagnosis present

## 2022-09-07 DIAGNOSIS — N938 Other specified abnormal uterine and vaginal bleeding: Secondary | ICD-10-CM | POA: Diagnosis present

## 2022-09-07 DIAGNOSIS — N939 Abnormal uterine and vaginal bleeding, unspecified: Secondary | ICD-10-CM | POA: Diagnosis present

## 2022-09-07 DIAGNOSIS — D62 Acute posthemorrhagic anemia: Secondary | ICD-10-CM | POA: Diagnosis present

## 2022-09-07 DIAGNOSIS — D649 Anemia, unspecified: Secondary | ICD-10-CM | POA: Diagnosis present

## 2022-09-07 DIAGNOSIS — Z79899 Other long term (current) drug therapy: Secondary | ICD-10-CM

## 2022-09-07 DIAGNOSIS — Z91148 Patient's other noncompliance with medication regimen for other reason: Secondary | ICD-10-CM

## 2022-09-07 DIAGNOSIS — D509 Iron deficiency anemia, unspecified: Secondary | ICD-10-CM | POA: Diagnosis present

## 2022-09-07 DIAGNOSIS — Z923 Personal history of irradiation: Secondary | ICD-10-CM

## 2022-09-07 DIAGNOSIS — F609 Personality disorder, unspecified: Secondary | ICD-10-CM | POA: Diagnosis present

## 2022-09-07 DIAGNOSIS — F22 Delusional disorders: Secondary | ICD-10-CM | POA: Diagnosis present

## 2022-09-07 DIAGNOSIS — F250 Schizoaffective disorder, bipolar type: Secondary | ICD-10-CM | POA: Diagnosis present

## 2022-09-07 LAB — CBC WITH DIFFERENTIAL/PLATELET
Abs Immature Granulocytes: 0 10*3/uL (ref 0.00–0.07)
Basophils Absolute: 0 10*3/uL (ref 0.0–0.1)
Basophils Relative: 0 %
Eosinophils Absolute: 0.1 10*3/uL (ref 0.0–0.5)
Eosinophils Relative: 1 %
HCT: 27.6 % — ABNORMAL LOW (ref 36.0–46.0)
Hemoglobin: 7.7 g/dL — ABNORMAL LOW (ref 12.0–15.0)
Lymphocytes Relative: 30 %
Lymphs Abs: 2.1 10*3/uL (ref 0.7–4.0)
MCH: 20.6 pg — ABNORMAL LOW (ref 26.0–34.0)
MCHC: 27.9 g/dL — ABNORMAL LOW (ref 30.0–36.0)
MCV: 73.8 fL — ABNORMAL LOW (ref 80.0–100.0)
Monocytes Absolute: 0.2 10*3/uL (ref 0.1–1.0)
Monocytes Relative: 3 %
Neutro Abs: 4.7 10*3/uL (ref 1.7–7.7)
Neutrophils Relative %: 66 %
Platelets: 463 10*3/uL — ABNORMAL HIGH (ref 150–400)
RBC: 3.74 MIL/uL — ABNORMAL LOW (ref 3.87–5.11)
RDW: 21.8 % — ABNORMAL HIGH (ref 11.5–15.5)
WBC: 7.1 10*3/uL (ref 4.0–10.5)
nRBC: 0 % (ref 0.0–0.2)
nRBC: 0 /100 WBC

## 2022-09-07 LAB — URINALYSIS, ROUTINE W REFLEX MICROSCOPIC
Bacteria, UA: NONE SEEN
Bilirubin Urine: NEGATIVE
Glucose, UA: NEGATIVE mg/dL
Ketones, ur: NEGATIVE mg/dL
Leukocytes,Ua: NEGATIVE
Nitrite: NEGATIVE
Protein, ur: 100 mg/dL — AB
RBC / HPF: >50 RBC/hpf — ABNORMAL HIGH (ref 0–5)
Specific Gravity, Urine: 1.031 — ABNORMAL HIGH (ref 1.005–1.030)
pH: 5 (ref 5.0–8.0)

## 2022-09-07 LAB — COMPREHENSIVE METABOLIC PANEL
ALT: 9 U/L (ref 0–44)
AST: 16 U/L (ref 15–41)
Albumin: 3.8 g/dL (ref 3.5–5.0)
Alkaline Phosphatase: 51 U/L (ref 38–126)
Anion gap: 8 (ref 5–15)
BUN: 12 mg/dL (ref 6–20)
CO2: 22 mmol/L (ref 22–32)
Calcium: 9 mg/dL (ref 8.9–10.3)
Chloride: 108 mmol/L (ref 98–111)
Creatinine, Ser: 0.81 mg/dL (ref 0.44–1.00)
GFR, Estimated: >60 mL/min (ref >60)
Glucose, Bld: 105 mg/dL — ABNORMAL HIGH (ref 70–99)
Potassium: 3.8 mmol/L (ref 3.5–5.1)
Sodium: 138 mmol/L (ref 135–145)
Total Bilirubin: 0.4 mg/dL (ref 0.3–1.2)
Total Protein: 7.6 g/dL (ref 6.5–8.1)

## 2022-09-07 LAB — I-STAT BETA HCG BLOOD, ED (MC, WL, AP ONLY): I-stat hCG, quantitative: <5 m[IU]/mL (ref <5)

## 2022-09-07 NOTE — ED Triage Notes (Signed)
EMS report: pt BIB [facility] c/o chronic abnormal vaginal bleeding. Started 1/2 and restarted today 1/10. Changing pads q2h. EMS VS: BP 190/102, PR 106, RR 18, 93% RA, CBG 120.

## 2022-09-07 NOTE — ED Provider Triage Note (Signed)
Emergency Medicine Provider Triage Evaluation Note  Valerie Howard , a 47 y.o. female  was evaluated in triage.  Pt complains of chronic vaginal bleeding. Changing a pad q2 hours. Has not followed up with OBGYN yet. Was prescribed megace, but has not filled prescription yet.   Review of Systems  Positive: Vaginal bleeding Negative: fever  Physical Exam  BP (!) 141/89 (BP Location: Left Arm)   Pulse 80   Temp 99 F (37.2 C) (Oral)   Resp 16   Ht '5\' 7"'$  (1.702 m)   Wt 95.3 kg   LMP 09/07/2022   SpO2 100%   BMI 32.89 kg/m  Gen:   Awake, no distress   Resp:  Normal effort  MSK:   Moves extremities without difficulty  Other:    Medical Decision Making  Medically screening exam initiated at 9:51 PM.  Appropriate orders placed.  Valerie Howard was informed that the remainder of the evaluation will be completed by another provider, this initial triage assessment does not replace that evaluation, and the importance of remaining in the ED until their evaluation is complete.  Labs to check hemoglobin   Valerie Bouchard, PA-C 09/07/22 2152

## 2022-09-08 DIAGNOSIS — N939 Abnormal uterine and vaginal bleeding, unspecified: Secondary | ICD-10-CM | POA: Diagnosis present

## 2022-09-08 LAB — RETICULOCYTES
Immature Retic Fract: 25.7 % — ABNORMAL HIGH (ref 2.3–15.9)
RBC.: 3.24 MIL/uL — ABNORMAL LOW (ref 3.87–5.11)
Retic Count, Absolute: 26.9 10*3/uL (ref 19.0–186.0)
Retic Ct Pct: 0.8 % (ref 0.4–3.1)

## 2022-09-08 LAB — IRON AND TIBC
Iron: 15 ug/dL — ABNORMAL LOW (ref 28–170)
Saturation Ratios: 4 % — ABNORMAL LOW (ref 10.4–31.8)
TIBC: 399 ug/dL (ref 250–450)
UIBC: 384 ug/dL

## 2022-09-08 LAB — CBC
HCT: 25.6 % — ABNORMAL LOW (ref 36.0–46.0)
Hemoglobin: 7.2 g/dL — ABNORMAL LOW (ref 12.0–15.0)
MCH: 20.9 pg — ABNORMAL LOW (ref 26.0–34.0)
MCHC: 28.1 g/dL — ABNORMAL LOW (ref 30.0–36.0)
MCV: 74.2 fL — ABNORMAL LOW (ref 80.0–100.0)
Platelets: 419 10*3/uL — ABNORMAL HIGH (ref 150–400)
RBC: 3.45 MIL/uL — ABNORMAL LOW (ref 3.87–5.11)
RDW: 21.7 % — ABNORMAL HIGH (ref 11.5–15.5)
WBC: 8.5 10*3/uL (ref 4.0–10.5)
nRBC: 0 % (ref 0.0–0.2)

## 2022-09-08 LAB — FOLATE: Folate: 23.2 ng/mL (ref >5.9)

## 2022-09-08 LAB — VITAMIN B12: Vitamin B-12: 139 pg/mL — ABNORMAL LOW (ref 180–914)

## 2022-09-08 LAB — FERRITIN: Ferritin: 2 ng/mL — ABNORMAL LOW (ref 11–307)

## 2022-09-08 LAB — RESP PANEL BY RT-PCR (RSV, FLU A&B, COVID)  RVPGX2
Influenza A by PCR: NEGATIVE
Influenza B by PCR: NEGATIVE
Resp Syncytial Virus by PCR: NEGATIVE
SARS Coronavirus 2 by RT PCR: NEGATIVE

## 2022-09-08 LAB — PREPARE RBC (CROSSMATCH)

## 2022-09-08 MED ORDER — MEGESTROL ACETATE 40 MG PO TABS
80.0000 mg | ORAL_TABLET | Freq: Two times a day (BID) | ORAL | Status: DC
  Administered 2022-09-08 – 2022-09-12 (×9): 80 mg via ORAL
  Filled 2022-09-08 (×11): qty 2

## 2022-09-08 MED ORDER — ACETAMINOPHEN 650 MG RE SUPP: 650.0000 mg | Freq: Four times a day (QID) | RECTAL | Status: DC | PRN

## 2022-09-08 MED ORDER — LACTATED RINGERS IV BOLUS
1000.0000 mL | Freq: Once | INTRAVENOUS | Status: AC
  Administered 2022-09-08: 1000 mL via INTRAVENOUS

## 2022-09-08 MED ORDER — SENNOSIDES-DOCUSATE SODIUM 8.6-50 MG PO TABS
2.0000 | ORAL_TABLET | Freq: Every day | ORAL | Status: DC
  Filled 2022-09-08 (×2): qty 2

## 2022-09-08 MED ORDER — FERROUS SULFATE 325 (65 FE) MG PO TABS
325.0000 mg | ORAL_TABLET | Freq: Every day | ORAL | Status: DC
  Administered 2022-09-09: 325 mg via ORAL
  Filled 2022-09-08: qty 1

## 2022-09-08 MED ORDER — IBUPROFEN 400 MG PO TABS
600.0000 mg | ORAL_TABLET | Freq: Once | ORAL | Status: AC
  Administered 2022-09-08: 600 mg via ORAL
  Filled 2022-09-08: qty 1

## 2022-09-08 MED ORDER — SODIUM CHLORIDE 0.9 % IV SOLN
250.0000 mg | Freq: Once | INTRAVENOUS | Status: AC
  Administered 2022-09-08: 250 mg via INTRAVENOUS
  Filled 2022-09-08: qty 20

## 2022-09-08 MED ORDER — SODIUM CHLORIDE 0.9% IV SOLUTION: Freq: Once | INTRAVENOUS | Status: DC

## 2022-09-08 MED ORDER — ACETAMINOPHEN 325 MG PO TABS
650.0000 mg | ORAL_TABLET | Freq: Four times a day (QID) | ORAL | Status: DC | PRN
  Administered 2022-09-08 – 2022-09-09 (×3): 650 mg via ORAL
  Filled 2022-09-08 (×4): qty 2

## 2022-09-08 NOTE — ED Notes (Signed)
This RN spoke with blood bank and made them aware that patient will need ordered transfusion. Per blood bank they are attempting to find a unit compatible for patient so they are unaware how long it will take to get one. Provider Regenia Skeeter made aware of same.

## 2022-09-08 NOTE — H&P (Addendum)
Date: 09/08/2022               Patient Name:  Valerie Howard MRN: [id number]  DOB: [birth year] Age / Sex: 47 y.o., female   PCP: Patient, No Pcp Per              Medical Service: Internal Medicine Teaching Service              Attending Physician: Dr. Lottie Mussel, MD    First Contact: Spero Curb, MS 3 Pager: [phone]  Second Contact: Dr. Romana Juniper, MD  Pager: [phone]   Third Contact Dr. Delene Ruffini Pager: [phone]        After Hours (After 5p/  First Contact Pager: [phone]  weekends / holidays): Second Contact Pager: [phone]   Chief Complaint: Vaginal Bleeding  History of Present Illness:   Valerie Howard is a 47 year old woman with history of vaginal bleeding, fibroids, menorrhagia, anemia requiring transfusion, homelessness, and schizophrenia who presents with heavy vaginal bleeding of 1 day's duration. She also endorses fatigue and SOB, is "sleeping a lot." Patient denies passing large clots this episode, denies vaginal or pelvic pain and dysuria. Patient says she has a prescription for Megace which has worked well for multiple past bleeding episodes but it "was stolen." She endorses getting a period every month, sometimes twice a month, period lasts 7 days using 1-2 pads every hour when having an episode of heavy bleeding.   She endorses one episode of vomiting today, associated with subjective fever and soft stool.  Patient states she receives care at [facility]   Patient became unwilling to answer questions and states she "does not know [place]" and declined a physical exam.   ED Course: Patient was mildly tachycardic to 113, vitals otherwise stable. Hgb 7.2, MCV 74.2. BMP unremarkable.    Meds:  Miralax 17g Tessalon '100mg'$  capsule Megace '20mg'$  Poly-Iron 150 Forte Prednisone '10mg'$  x 5 days   Allergies: Allergies as of 09/07/2022 - Review Complete 09/07/2022  Allergen Reaction Noted   Penicillins Hives and Itching    Past Medical  History:  Diagnosis Date   History of radiation therapy 07/18/17-07/24/17   right ear, operative bed 12 Gy in 3 fractions   Keloid    right ear   Paranoid behavior ([place])    Schizophrenia ([place])     Family History: Patient denied family history of bleeding disorders.   Social History: Patient has four children. Patient is homeless. Declined to answer any further questions.   Review of Systems: A complete ROS was negative except as per HPI.   Physical Exam: Blood pressure 127/72, pulse (!) 101, temperature 98.4 F (36.9 C), temperature source Oral, resp. rate 16, height '5\' 7"'$  (1.702 m), weight 95.3 kg, last menstrual period 09/07/2022, SpO2 100 %.  General: Alert, in no acute distress. Not ill-appearing.  HEENT: Normocephalic, atraumatic.  Pulm: In no respiratory distress.  Neuro: Alert and conversational.  Psych: Answered most questions appropriately at the beginning of interview, but patient became increasingly irritable with some paranoia. Mildly tangential.    Assessment & Plan by Problem: Principal Problem:   Abnormal uterine bleeding  Valerie Howard is a 47 year old woman G3P4 G3P4 with past medical history significant for vaginal bleeding, fibroids, menorrhagia, microcytic anemia, homelessness, and schizoaffective disorder who presents for heavy vaginal bleeding and admitted for anemia in the setting of acute blood loss.   Vaginal Bleeding Microcytic Anemia, likely iron-deficiency anemia in setting of chronic blood loss Uterine  Fibroids MCV 74.2, Ferritin 2. Blood beta-hCG negative. Patient's hemoglobin has slowly downtrended from 9.1 one month ago to 7.2 today, consistent with previous episodes of acute vaginal bleeding.  Total iron deficit '1598mg'$ , will replete with IV iron. Per chart review, transvaginal U/S in June 2021 showed multiple uterine fibroids and repeat U/S in May 2022 showed interval growth in size. VWF negative per [facility] office visit note from 2015.  Patient's hemoglobin is borderline but is symptomatic. Patient currently hemodynamically stable, not tachycardic. Discussed with Gyn, they suggested IR embolization as a potential option to minimize follow up and pain. Gyn attempted to evaluate today, but could not complete formal consult due to patient's mental status. Will plan to follow up with Gyn as patient's cooperation allows to discuss treatment options for this admission.  -Transfuse 1 unit pRBC, f/u post-transfusion H&H -IV iron '250mg'$  '@135'$  mL/hr x1 -Megace '80mg'$  BID -F/u Gyn recommendations -CBC AM -Avoid pharmacologic DVT prophylaxis  History of schizophrenia Per chart review, patient has a history of schizophrenia with poor adherence to psychiatric medications, [facility] admission to [facility] 01/2020. Patient became unwilling to answer questions over the course of interview and thought content is paranoid. Patient has not had consistent outpatient follow up with respect to recurrent heavy vaginal bleeding secondary to poorly-managed schizophrenia,  plan to consult psych to evaluate if any psychiatric treatment would be appropriate during this admission to help facilitate treatment for other medical conditions. -F/u Psychiatry recommendations   Full code Regular Diet DVT: SCDs IVF: none  Dispo: Admit patient to Observation with expected length of stay less than 2 midnights.  Signed: Spero Curb, Medical Student 09/08/2022, 6:26 PM  Pager: [phone]   Attestation for Student Documentation: I personally was present and performed or re-performed the history, physical exam and medical decision-making activities of this service and have verified that the service and findings are accurately documented in the student's note. Delene Ruffini, MD 09/08/2022, 7:48 PM

## 2022-09-08 NOTE — ED Notes (Signed)
ED TO INPATIENT HANDOFF REPORT  ED Nurse Name and Phone #: Gregoire Bennis RN   S Name/Age/Gender Valerie Howard 47 y.o. female Room/Bed: 026C/026C  Code Status   Code Status: Full Code  Home/SNF/Other Home Patient oriented to: self, place, time, and situation Is this baseline? Yes   Triage Complete: Triage complete  Chief Complaint Abnormal uterine bleeding [N93.9]  Triage Note EMS report: pt BIB [facility] c/o chronic abnormal vaginal bleeding. Started 1/2 and restarted today 1/10. Changing pads q2h. EMS VS: BP 190/102, PR 106, RR 18, 93% RA, CBG 120.    Allergies Allergies  Allergen Reactions   Penicillins Hives and Itching    Has patient had a PCN reaction causing immediate rash, facial/tongue/throat swelling, SOB or lightheadedness with hypotension:Patient refuses to answer (PRA) Has patient had a PCN reaction causing severe rash involving mucus membranes or skin necrosis:PRA Has patient had a PCN reaction that required hospitalization PRA Has patient had a PCN reaction occurring within the last 10 years:PRA If all of the above answers are "NO", then may proceed with Cephalosporin use.     Level of Care/Admitting Diagnosis ED Disposition     ED Disposition  Admit   Condition  --   [place]: [place] [[id number]]  Level of Care: Med-Surg [16]  May place patient in observation at [facility] or [place] if equivalent level of care is available:: Yes  Covid Evaluation: Asymptomatic - no recent exposure (last 10 days) testing not required  Diagnosis: Abnormal uterine bleeding [342081]  Admitting Physician: Lottie Mussel [[id number]]  Attending Physician: Lottie Mussel [[id number]]          B Medical/Surgery History Past Medical History:  Diagnosis Date   History of radiation therapy 07/18/17-07/24/17   right ear, operative bed 12 Gy in 3 fractions   Keloid    right ear   Paranoid behavior ([place])    Schizophrenia ([place])    Past Surgical  History:  Procedure Laterality Date   CESAREAN SECTION     KENALOG INJECTION Bilateral 07/17/2017   Procedure: KENALOG INJECTION;  Surgeon: Wallace Going, DO;  Location: [facility];  Service: Plastics;  Laterality: Bilateral;   MASS EXCISION N/A 07/17/2017   Procedure: EXCISION KELOID OF RIGHT EAR WITH INTROPERATIVE KENALOG INJECTION AND POST OP RADIATION;  Surgeon: Wallace Going, DO;  Location: [facility];  Service: Plastics;  Laterality: N/A;   TUBAL LIGATION       A IV Location/Drains/Wounds Patient Lines/Drains/Airways Status     Active Line/Drains/Airways     Name Placement date Placement time Site Days   Peripheral IV 09/08/22 Right Antecubital 09/08/22  1052  Antecubital  less than 1            Intake/Output Last 24 hours No intake or output data in the 24 hours ending 09/08/22 1620  Labs/Imaging Results for orders placed or performed during the hospital encounter of 09/07/22 (from the past 48 hour(s))  CBC with Differential     Status: Abnormal   Collection Time: 09/07/22 10:10 PM  Result Value Ref Range   WBC 7.1 4.0 - 10.5 K/uL   RBC 3.74 (L) 3.87 - 5.11 MIL/uL   Hemoglobin 7.7 (L) 12.0 - 15.0 g/dL    Comment: Reticulocyte Hemoglobin testing may be clinically indicated, consider ordering this additional test [id number]    HCT 27.6 (L) 36.0 - 46.0 %   MCV 73.8 (L) 80.0 - 100.0 fL   MCH 20.6 (  L) 26.0 - 34.0 pg   MCHC 27.9 (L) 30.0 - 36.0 g/dL   RDW 21.8 (H) 11.5 - 15.5 %   Platelets 463 (H) 150 - 400 K/uL   nRBC 0.0 0.0 - 0.2 %   Neutrophils Relative % 66 %   Neutro Abs 4.7 1.7 - 7.7 K/uL   Lymphocytes Relative 30 %   Lymphs Abs 2.1 0.7 - 4.0 K/uL   Monocytes Relative 3 %   Monocytes Absolute 0.2 0.1 - 1.0 K/uL   Eosinophils Relative 1 %   Eosinophils Absolute 0.1 0.0 - 0.5 K/uL   Basophils Relative 0 %   Basophils Absolute 0.0 0.0 - 0.1 K/uL   nRBC 0 0 /100 WBC   Abs Immature Granulocytes 0.00 0.00 - 0.07  K/uL   Schistocytes PRESENT    Pappenheimer Bodies PRESENT    Basophilic Stippling PRESENT    Ovalocytes PRESENT     Comment: Performed at [facility], [place] [address] [place], [place] [address]  Comprehensive metabolic panel     Status: Abnormal   Collection Time: 09/07/22 10:10 PM  Result Value Ref Range   Sodium 138 135 - 145 mmol/L   Potassium 3.8 3.5 - 5.1 mmol/L   Chloride 108 98 - 111 mmol/L   CO2 22 22 - 32 mmol/L   Glucose, Bld 105 (H) 70 - 99 mg/dL    Comment: Glucose reference range applies only to samples taken after fasting for at least 8 hours.   BUN 12 6 - 20 mg/dL   Creatinine, Ser 0.81 0.44 - 1.00 mg/dL   Calcium 9.0 8.9 - 10.3 mg/dL   Total Protein 7.6 6.5 - 8.1 g/dL   Albumin 3.8 3.5 - 5.0 g/dL   AST 16 15 - 41 U/L   ALT 9 0 - 44 U/L   Alkaline Phosphatase 51 38 - 126 U/L   Total Bilirubin 0.4 0.3 - 1.2 mg/dL   GFR, Estimated >60 >60 mL/min    Comment: (NOTE) Calculated using the CKD-EPI Creatinine Equation (2021)    Anion gap 8 5 - 15    Comment: Performed at [facility] [address], [place], [place] [address]  Urinalysis, Routine w reflex microscopic Urine, Clean Catch     Status: Abnormal   Collection Time: 09/07/22 10:10 PM  Result Value Ref Range   Color, Urine AMBER (A) YELLOW    Comment: BIOCHEMICALS MAY BE AFFECTED BY COLOR   APPearance CLOUDY (A) CLEAR   Specific Gravity, Urine 1.031 (H) 1.005 - 1.030   pH 5.0 5.0 - 8.0   Glucose, UA NEGATIVE NEGATIVE mg/dL   Hgb urine dipstick LARGE (A) NEGATIVE   Bilirubin Urine NEGATIVE NEGATIVE   Ketones, ur NEGATIVE NEGATIVE mg/dL   Protein, ur 100 (A) NEGATIVE mg/dL   Nitrite NEGATIVE NEGATIVE   Leukocytes,Ua NEGATIVE NEGATIVE   RBC / HPF >50 (H) 0 - 5 RBC/hpf   WBC, UA 0-5 0 - 5 WBC/hpf   Bacteria, UA NONE SEEN NONE SEEN   Squamous Epithelial / HPF 6-10 0 - 5 /HPF   Mucus PRESENT    Ca Oxalate Crys, UA PRESENT     Comment: Performed at [facility], [place] [address],  [place], [place] [address]  I-Stat Beta hCG blood, ED ([facility], [facility], [facility] only)     Status: None   Collection Time: 09/07/22 10:51 PM  Result Value Ref Range   I-stat hCG, quantitative <5.0 <5 mIU/mL   Comment 3  Comment:   GEST. AGE      CONC.  (mIU/mL)   <=1 WEEK        5 - 50     2 WEEKS       50 - 500     3 WEEKS       100 - 10,000     4 WEEKS     1,000 - 30,000        FEMALE AND NON-PREGNANT FEMALE:     LESS THAN 5 mIU/mL   Resp panel by RT-PCR (RSV, Flu A&B, Covid) Anterior Nasal Swab     Status: None   Collection Time: 09/08/22 10:59 AM   Specimen: Anterior Nasal Swab  Result Value Ref Range   SARS Coronavirus 2 by RT PCR NEGATIVE NEGATIVE    Comment: (NOTE) SARS-CoV-2 target nucleic acids are NOT DETECTED.  The SARS-CoV-2 RNA is generally detectable in upper respiratory specimens during the acute phase of infection. The lowest concentration of SARS-CoV-2 viral copies this assay can detect is 138 copies/mL. A negative result does not preclude SARS-Cov-2 infection and should not be used as the sole basis for treatment or other patient management decisions. A negative result may occur with  improper specimen collection/handling, submission of specimen other than nasopharyngeal swab, presence of viral mutation(s) within the areas targeted by this assay, and inadequate number of viral copies(<138 copies/mL). A negative result must be combined with clinical observations, patient history, and epidemiological information. The expected result is Negative.  Fact Sheet for Patients:  EntrepreneurPulse.com.au  Fact Sheet for Healthcare Providers:  IncredibleEmployment.be  This test is no t yet approved or cleared by the [place] FDA and  has been authorized for detection and/or diagnosis of SARS-CoV-2 by FDA under an Emergency Use Authorization (EUA). This EUA will remain  in effect (meaning this test can be used) for the duration of  the COVID-19 declaration under Section 564(b)(1) of the Act, 21 U.S.C.section 360bbb-3(b)(1), unless the authorization is terminated  or revoked sooner.       Influenza A by PCR NEGATIVE NEGATIVE   Influenza B by PCR NEGATIVE NEGATIVE    Comment: (NOTE) The Xpert Xpress SARS-CoV-2/FLU/RSV plus assay is intended as an aid in the diagnosis of influenza from Nasopharyngeal swab specimens and should not be used as a sole basis for treatment. Nasal washings and aspirates are unacceptable for Xpert Xpress SARS-CoV-2/FLU/RSV testing.  Fact Sheet for Patients: EntrepreneurPulse.com.au  Fact Sheet for Healthcare Providers: IncredibleEmployment.be  This test is not yet approved or cleared by the [place] FDA and has been authorized for detection and/or diagnosis of SARS-CoV-2 by FDA under an Emergency Use Authorization (EUA). This EUA will remain in effect (meaning this test can be used) for the duration of the COVID-19 declaration under Section 564(b)(1) of the Act, 21 U.S.C. section 360bbb-3(b)(1), unless the authorization is terminated or revoked.     Resp Syncytial Virus by PCR NEGATIVE NEGATIVE    Comment: (NOTE) Fact Sheet for Patients: EntrepreneurPulse.com.au  Fact Sheet for Healthcare Providers: IncredibleEmployment.be  This test is not yet approved or cleared by the [place] FDA and has been authorized for detection and/or diagnosis of SARS-CoV-2 by FDA under an Emergency Use Authorization (EUA). This EUA will remain in effect (meaning this test can be used) for the duration of the COVID-19 declaration under Section 564(b)(1) of the Act, 21 U.S.C. section 360bbb-3(b)(1), unless the authorization is terminated or revoked.  Performed at [facility], [place] [address],  [place], [place] [address]   CBC     Status: Abnormal   Collection Time: 09/08/22 11:01 AM  Result Value Ref Range    WBC 8.5 4.0 - 10.5 K/uL   RBC 3.45 (L) 3.87 - 5.11 MIL/uL   Hemoglobin 7.2 (L) 12.0 - 15.0 g/dL    Comment: Reticulocyte Hemoglobin testing may be clinically indicated, consider ordering this additional test [id number]    HCT 25.6 (L) 36.0 - 46.0 %   MCV 74.2 (L) 80.0 - 100.0 fL   MCH 20.9 (L) 26.0 - 34.0 pg   MCHC 28.1 (L) 30.0 - 36.0 g/dL   RDW 21.7 (H) 11.5 - 15.5 %   Platelets 419 (H) 150 - 400 K/uL   nRBC 0.0 0.0 - 0.2 %    Comment: Performed at [facility], [place] [address] [place], [place] [address]  Prepare RBC (crossmatch)     Status: None   Collection Time: 09/08/22 12:57 PM  Result Value Ref Range   Order Confirmation      ORDER PROCESSED BY BLOOD BANK Performed at [facility], [place] [address], [place], [place] [address]   Type and screen [place]     Status: None   Collection Time: 09/08/22  1:00 PM  Result Value Ref Range   ABO/RH(D) O POS    Antibody Screen NEG    Sample Expiration      09/11/2022,2359 Performed at [facility], [place] [address], [place], [place] [address]   Vitamin B12     Status: Abnormal   Collection Time: 09/08/22  2:06 PM  Result Value Ref Range   Vitamin B-12 139 (L) 180 - 914 pg/mL    Comment: (NOTE) This assay is not validated for testing neonatal or myeloproliferative syndrome specimens for Vitamin B12 levels. Performed at [facility], [place] [address], [place], [place] [address]   Folate     Status: None   Collection Time: 09/08/22  2:06 PM  Result Value Ref Range   Folate 23.2 >5.9 ng/mL    Comment: Performed at [facility], [place] [address] [place], [place] [address]  Iron and TIBC     Status: Abnormal   Collection Time: 09/08/22  2:06 PM  Result Value Ref Range   Iron 15 (L) 28 - 170 ug/dL   TIBC 399 250 - 450 ug/dL   Saturation Ratios 4 (L) 10.4 - 31.8 %   UIBC 384 ug/dL    Comment: Performed at [facility], [place] [address] [place], [place] [address]  Ferritin      Status: Abnormal   Collection Time: 09/08/22  2:06 PM  Result Value Ref Range   Ferritin 2 (L) 11 - 307 ng/mL    Comment: Performed at [facility], [place] [address] [place] [address], [place], [place] [address]  Reticulocytes     Status: Abnormal   Collection Time: 09/08/22  2:06 PM  Result Value Ref Range   Retic Ct Pct 0.8 0.4 - 3.1 %   RBC. 3.24 (L) 3.87 - 5.11 MIL/uL   Retic Count, Absolute 26.9 19.0 - 186.0 K/uL   Immature Retic Fract 25.7 (H) 2.3 - 15.9 %    Comment: Performed at [facility] [address], [place], [place] [address]   No results found.  Pending Labs Unresulted Labs (From admission, onward)     Start     Ordered   09/09/22 7858  Basic metabolic  panel  Tomorrow morning,   R        09/08/22 1604   09/09/22 0500  CBC  Tomorrow morning,   R        09/08/22 1604            Vitals/Pain Today's Vitals   09/08/22 1115 09/08/22 1200 09/08/22 1300 09/08/22 1404  BP: 115/66 127/74 116/77 (!) 129/98  Pulse: 96 93 (!) 113 100  Resp: '16 16 16 16  '$ Temp:    99.1 F (37.3 C)  TempSrc:    Oral  SpO2: 100% 99% 98% 100%  Weight:      Height:      PainSc:        Isolation Precautions Airborne and Contact precautions  Medications Medications  megestrol (MEGACE) tablet 80 mg (has no administration in time range)  0.9 %  sodium chloride infusion (Manually program via Guardrails IV Fluids) (0 mLs Intravenous Hold 09/08/22 1505)  acetaminophen (TYLENOL) tablet 650 mg (has no administration in time range)    Or  acetaminophen (TYLENOL) suppository 650 mg (has no administration in time range)  lactated ringers bolus 1,000 mL (0 mLs Intravenous Stopped 09/08/22 1301)  ibuprofen (ADVIL) tablet 600 mg (600 mg Oral Given 09/08/22 1107)    Mobility walks Low fall risk   Focused Assessments Cardiac Assessment Handoff:    No results found for: "CKTOTAL", "CKMB", "CKMBINDEX", "TROPONINI" Lab Results  Component Value Date   DDIMER 0.54 (H) 08/07/2019   Does the  Patient currently have chest pain? No   , Pulmonary Assessment Handoff:  Lung sounds:   O2 Device: Room Air      R Recommendations: See Admitting Provider Note  Report given to:   Additional Notes:

## 2022-09-08 NOTE — ED Provider Notes (Signed)
[facility] EMERGENCY DEPARTMENT Provider Note   CSN: [id number] Arrival date & time: 09/07/22  2019     History  Chief Complaint  Patient presents with   Vaginal Bleeding    Valerie Howard is a 47 y.o. female.  HPI 47 year old female with a history of paranoia, schizophrenia, fibroids and previous blood transfusion for anemia presents with vaginal bleeding.  She states this started yesterday.  She was previously seen for this at the beginning of the month and states that the last time she had any relief was at the end of November when she was treated in the hospital.  She denies any abdominal pain.  She states she is having heavy bleeding.  She denies any dizziness, lightheadedness, shortness of breath or fever.  Patient is difficult to get a history from and after ask a few questions she becomes irritated.  She mostly tells me that she just does not feel well and the lights are bothering her.  Home Medications Prior to Admission medications   Medication Sig Start Date End Date Taking? Authorizing Provider  benzonatate (TESSALON) 100 MG capsule Take 1 capsule (100 mg total) by mouth every 8 (eight) hours. Patient not taking: Reported on 09/07/2022 08/31/22   Tretha Sciara, MD  polyethylene glycol (MIRALAX) 17 g packet Take 17 g by mouth daily. Patient not taking: Reported on 09/07/2022 08/29/22   Fransico Meadow, MD      Allergies    Penicillins    Review of Systems   Review of Systems  Respiratory:  Negative for shortness of breath.   Gastrointestinal:  Negative for abdominal pain.  Genitourinary:  Positive for vaginal bleeding.    Physical Exam Updated Vital Signs BP (!) 129/98 (BP Location: Left Arm)   Pulse 100   Temp 99.1 F (37.3 C) (Oral)   Resp 16   Ht '5\' 7"'$  (1.702 m)   Wt 95.3 kg   LMP 09/07/2022   SpO2 100%   BMI 32.89 kg/m  Physical Exam Vitals and nursing note reviewed.  Constitutional:      General: She is not in acute  distress.    Appearance: She is well-developed. She is not ill-appearing or diaphoretic.  HENT:     Head: Normocephalic and atraumatic.  Cardiovascular:     Rate and Rhythm: Regular rhythm. Tachycardia present.     Heart sounds: Normal heart sounds.     Comments: HR low 100s Pulmonary:     Effort: Pulmonary effort is normal.  Abdominal:     General: There is no distension.     Palpations: Abdomen is soft.     Tenderness: There is no abdominal tenderness.  Skin:    General: Skin is warm and dry.  Neurological:     Mental Status: She is alert.     ED Results / Procedures / Treatments   Labs (all labs ordered are listed, but only abnormal results are displayed) Labs Reviewed  CBC WITH DIFFERENTIAL/PLATELET - Abnormal; Notable for the following components:      Result Value   RBC 3.74 (*)    Hemoglobin 7.7 (*)    HCT 27.6 (*)    MCV 73.8 (*)    MCH 20.6 (*)    MCHC 27.9 (*)    RDW 21.8 (*)    Platelets 463 (*)    All other components within normal limits  COMPREHENSIVE METABOLIC PANEL - Abnormal; Notable for the following components:   Glucose, Bld 105 (*)  All other components within normal limits  URINALYSIS, ROUTINE W REFLEX MICROSCOPIC - Abnormal; Notable for the following components:   Color, Urine AMBER (*)    APPearance CLOUDY (*)    Specific Gravity, Urine 1.031 (*)    Hgb urine dipstick LARGE (*)    Protein, ur 100 (*)    RBC / HPF >50 (*)    All other components within normal limits  CBC - Abnormal; Notable for the following components:   RBC 3.45 (*)    Hemoglobin 7.2 (*)    HCT 25.6 (*)    MCV 74.2 (*)    MCH 20.9 (*)    MCHC 28.1 (*)    RDW 21.7 (*)    Platelets 419 (*)    All other components within normal limits  RETICULOCYTES - Abnormal; Notable for the following components:   RBC. 3.24 (*)    Immature Retic Fract 25.7 (*)    All other components within normal limits  RESP PANEL BY RT-PCR (RSV, FLU A&B, COVID)  [id number]  VITAMIN B12  FOLATE   IRON AND TIBC  FERRITIN  I-STAT BETA HCG BLOOD, ED ([facility], [facility], [facility] ONLY)  TYPE AND SCREEN  PREPARE RBC (CROSSMATCH)    EKG None  Radiology No results found.  Procedures .Critical Care  Performed by: Sherwood Gambler, MD Authorized by: Sherwood Gambler, MD   Critical care provider statement:    Critical care time (minutes):  30   Critical care time was exclusive of:  Separately billable procedures and treating other patients   Critical care was necessary to treat or prevent imminent or life-threatening deterioration of the following conditions:  Circulatory failure   Critical care was time spent personally by me on the following activities:  Development of treatment plan with patient or surrogate, discussions with consultants, evaluation of patient's response to treatment, examination of patient, ordering and review of laboratory studies, ordering and review of radiographic studies, ordering and performing treatments and interventions, pulse oximetry, re-evaluation of patient's condition and review of old charts     Medications Ordered in ED Medications  megestrol (MEGACE) tablet 80 mg (has no administration in time range)  0.9 %  sodium chloride infusion (Manually program via Guardrails IV Fluids) (has no administration in time range)  lactated ringers bolus 1,000 mL (0 mLs Intravenous Stopped 09/08/22 1301)  ibuprofen (ADVIL) tablet 600 mg (600 mg Oral Given 09/08/22 1107)    ED Course/ Medical Decision Making/ A&P                           Medical Decision Making Amount and/or Complexity of Data Reviewed External Data Reviewed: notes.    Details: A couple previous admissions for similar anemia requiring transfusion Labs: ordered.    Details: Labs show downtrending hemoglobin, down from even last night to 7.2 this morning.  Risk Prescription drug management. Decision regarding hospitalization.   Patient is overall stable though was tachycardic on my exam and is  intermittently tachycardic.  1 low blood pressure but otherwise not hypotensive.  I discussed with gynecology on-call, Dr. Ernestina Patches, who will review and consult.  For now would like 80 mg of Megace twice daily.  Options will include Megace versus IUD versus hysterectomy.  The patient is currently refusing a pelvic ultrasound which has been recommended by Gyn as she has not had one in over a year and a half.  However with the downtrending hemoglobin and bleeding I think she will  need a blood transfusion.  She will need to be admitted to the hospital for monitoring.  Discussed with internal medicine teaching service who will admit.        Final Clinical Impression(s) / ED Diagnoses Final diagnoses:  Dysfunctional uterine bleeding  Symptomatic anemia    Rx / DC Orders ED Discharge Orders     None         Sherwood Gambler, MD 09/08/22 1454

## 2022-09-08 NOTE — Hospital Course (Addendum)
Softer stool Did throw up apple juice today - feeling nauseated, not eating as much.  Does endorse some  [place] long records? Has been bleeding since 2018.  Bleeding started yesterday Not as much blood today Was blood clots, has slowed down some today Will expell blood when she coughs Reports that between November and decmber 7th bleeding was improved with megace. Megace worked well, but she was not able to keep filled Gets period every month, but will have abnormal bleeding in between usual periods November was very heavy bleeding Period typically 7 days now, used to be 5 Was using 1-2 pads every hour back in novemvber No clots on this episode This episode is subsiding Initial dx fibroids when pregnant with twins (now 88) Had been going to [facility] gynecology Fibroids only dx All births fine, kids all fine, has had 3 pregnancy 4 total kids, no pregnancy complications Is noticing some shortness of breath, does feel tired, sleeping a lot recently Does not wish to share any OB provider details Doesn't have  Not able to give urine sample No pelvic or vaginal pain currently She does not want to follow with anyone for chronic problems *   --  Bleeding has slowed down Patient is not waiting vaginal instrumentation   Megace - she was taking medication,  Not able to wear tampons. Pads only. Has not been taking iron  No easy bleeding from other sites Medicaid -

## 2022-09-09 ENCOUNTER — Inpatient Hospital Stay (HOSPITAL_COMMUNITY): Payer: Medicaid Other

## 2022-09-09 DIAGNOSIS — Z5900 Homelessness unspecified: Secondary | ICD-10-CM

## 2022-09-09 DIAGNOSIS — F609 Personality disorder, unspecified: Secondary | ICD-10-CM | POA: Diagnosis present

## 2022-09-09 DIAGNOSIS — R451 Restlessness and agitation: Secondary | ICD-10-CM | POA: Diagnosis not present

## 2022-09-09 DIAGNOSIS — Z91148 Patient's other noncompliance with medication regimen for other reason: Secondary | ICD-10-CM | POA: Diagnosis not present

## 2022-09-09 DIAGNOSIS — Z803 Family history of malignant neoplasm of breast: Secondary | ICD-10-CM | POA: Diagnosis not present

## 2022-09-09 DIAGNOSIS — Z923 Personal history of irradiation: Secondary | ICD-10-CM | POA: Diagnosis not present

## 2022-09-09 DIAGNOSIS — J210 Acute bronchiolitis due to respiratory syncytial virus: Secondary | ICD-10-CM | POA: Diagnosis not present

## 2022-09-09 DIAGNOSIS — F250 Schizoaffective disorder, bipolar type: Secondary | ICD-10-CM | POA: Diagnosis present

## 2022-09-09 DIAGNOSIS — E876 Hypokalemia: Secondary | ICD-10-CM | POA: Diagnosis present

## 2022-09-09 DIAGNOSIS — Z79899 Other long term (current) drug therapy: Secondary | ICD-10-CM | POA: Diagnosis not present

## 2022-09-09 DIAGNOSIS — Z79818 Long term (current) use of other agents affecting estrogen receptors and estrogen levels: Secondary | ICD-10-CM | POA: Diagnosis not present

## 2022-09-09 DIAGNOSIS — D259 Leiomyoma of uterus, unspecified: Secondary | ICD-10-CM | POA: Diagnosis not present

## 2022-09-09 DIAGNOSIS — N939 Abnormal uterine and vaginal bleeding, unspecified: Secondary | ICD-10-CM | POA: Diagnosis present

## 2022-09-09 DIAGNOSIS — Z880 Allergy status to penicillin: Secondary | ICD-10-CM | POA: Diagnosis not present

## 2022-09-09 DIAGNOSIS — Z1152 Encounter for screening for COVID-19: Secondary | ICD-10-CM | POA: Diagnosis not present

## 2022-09-09 DIAGNOSIS — N920 Excessive and frequent menstruation with regular cycle: Secondary | ICD-10-CM | POA: Diagnosis present

## 2022-09-09 DIAGNOSIS — R058 Other specified cough: Secondary | ICD-10-CM | POA: Diagnosis present

## 2022-09-09 DIAGNOSIS — D62 Acute posthemorrhagic anemia: Secondary | ICD-10-CM | POA: Diagnosis present

## 2022-09-09 DIAGNOSIS — D509 Iron deficiency anemia, unspecified: Secondary | ICD-10-CM | POA: Diagnosis not present

## 2022-09-09 DIAGNOSIS — N938 Other specified abnormal uterine and vaginal bleeding: Secondary | ICD-10-CM | POA: Diagnosis present

## 2022-09-09 LAB — TYPE AND SCREEN
ABO/RH(D): O POS
Antibody Screen: NEGATIVE
Donor AG Type: NEGATIVE
Unit division: 0

## 2022-09-09 LAB — BPAM RBC
Blood Product Expiration Date: 202401242359
ISSUE DATE / TIME: 202401111811
Unit Type and Rh: 9500

## 2022-09-09 LAB — CBC
HCT: 29.3 % — ABNORMAL LOW (ref 36.0–46.0)
Hemoglobin: 8.5 g/dL — ABNORMAL LOW (ref 12.0–15.0)
MCH: 22 pg — ABNORMAL LOW (ref 26.0–34.0)
MCHC: 29 g/dL — ABNORMAL LOW (ref 30.0–36.0)
MCV: 75.9 fL — ABNORMAL LOW (ref 80.0–100.0)
Platelets: 379 10*3/uL (ref 150–400)
RBC: 3.86 MIL/uL — ABNORMAL LOW (ref 3.87–5.11)
RDW: 21 % — ABNORMAL HIGH (ref 11.5–15.5)
WBC: 4.7 10*3/uL (ref 4.0–10.5)
nRBC: 0 % (ref 0.0–0.2)

## 2022-09-09 LAB — BASIC METABOLIC PANEL
Anion gap: 10 (ref 5–15)
BUN: 9 mg/dL (ref 6–20)
CO2: 22 mmol/L (ref 22–32)
Calcium: 8.3 mg/dL — ABNORMAL LOW (ref 8.9–10.3)
Chloride: 105 mmol/L (ref 98–111)
Creatinine, Ser: 0.87 mg/dL (ref 0.44–1.00)
GFR, Estimated: >60 mL/min (ref >60)
Glucose, Bld: 90 mg/dL (ref 70–99)
Potassium: 3.1 mmol/L — ABNORMAL LOW (ref 3.5–5.1)
Sodium: 137 mmol/L (ref 135–145)

## 2022-09-09 MED ORDER — SODIUM CHLORIDE 0.9 % IV SOLN
250.0000 mg | Freq: Every day | INTRAVENOUS | Status: DC
  Administered 2022-09-09 – 2022-09-12 (×4): 250 mg via INTRAVENOUS
  Filled 2022-09-09 (×4): qty 20

## 2022-09-09 MED ORDER — POTASSIUM CHLORIDE CRYS ER 20 MEQ PO TBCR
40.0000 meq | EXTENDED_RELEASE_TABLET | Freq: Three times a day (TID) | ORAL | Status: AC
  Filled 2022-09-09: qty 2

## 2022-09-09 MED ORDER — HALOPERIDOL 5 MG PO TABS
10.0000 mg | ORAL_TABLET | Freq: Every day | ORAL | Status: DC
  Filled 2022-09-09 (×2): qty 2

## 2022-09-09 NOTE — Progress Notes (Addendum)
Subjective:  Patient reports her bleeding has slowed down today. She denies any SOB or fatigue today. She denies history of frequent epistaxis or bleeding from other sites/poor clotting.   She endorses taking Megace, and says that she does not need iron supplementation. She states she normally does not use tampons because it's harder to keep track of how much she is bleeding.   Patient states she is amenable to imaging but does not want transvaginal ultrasounds.   Objective:  Vital signs in last 24 hours: Vitals:   09/08/22 2144 09/09/22 0046 09/09/22 0417 09/09/22 0912  BP: 128/69 128/78 133/76 135/75  Pulse: 88 92 85 93  Resp: '18 17 18 16  '$ Temp: 98.8 F (37.1 C) 99 F (37.2 C) 98.5 F (36.9 C) 98.5 F (36.9 C)  TempSrc: Oral Oral Oral Oral  SpO2: 100% 100% 99% 99%  Weight:      Height:       Weight change:   Intake/Output Summary (Last 24 hours) at 09/09/2022 1112 Last data filed at 09/08/2022 2144 Gross per 24 hour  Intake 315 ml  Output --  Net 315 ml   Physical Exam General: Alert, in no acute distress. HEENT: Normocephalic CV: RRR, no murmurs, rubs, or gallops Pulm: No respiratory distress. Lungs CTA bilaterally.  Abd: Masses palpated in LRQ. Abdomen is non-tender, non-distended. Skin: Warm and dry Neuro: Alert and conversational.   Psych: Irritable affect, speech is pressured and at times tangential. Answers most questions appropriately.   Assessment/Plan:  Principal Problem:   Abnormal uterine bleeding Active Problems:   Personality disorder (HCC)   Schizoaffective disorder, bipolar type ([place])   Paranoia (psychosis) ([place])   Symptomatic anemia  Valerie Howard is a 47 year old woman G3P4 with past medical history significant for vaginal bleeding, fibroids, menorrhagia, microcytic anemia, homelessness, and schizoaffective disorder who presents for heavy vaginal bleeding and admitted for anemia in the setting of acute blood loss.   Vaginal Bleeding -  improving Microcytic Anemia, likely iron-deficiency anemia in setting of chronic blood loss - improving Uterine Fibroids Patient has remained hemodynamically stable with improvement in bleeding. Patient's Hgb improved to 8.5 today post-transfusion. Patient received 1x IV Ferrlecit yesterday, iron deficit today is '1301mg'$  so will continue to replete.Patient has palpable non-tender abdominal masses in the LRQ, possibly fibroids, on exam. Discussed with Gyn, they recommended transabdominal U/S to evaluate if any interval growth seen in uterine masses/fibroids as patient is unwilling to do transvaginal imaging. They also recommended endometrial biopsy, likely in outpatient setting to rule out malignancy, but this would not delay IR embolization if patient wished to proceed during this admission. Patient was uncooperative when Gyn attempted to evaluate her last night and will plan to follow up with Gyn after consulting with psychiatry.  -IV Ferrlecit '250mg'$  once daily for 5 days (1/5) -Megace '80mg'$  BID -F/U transabdominal U/S -F/U Gyn recommendations -CBC AM -Avoid pharmacologic DVT prophylaxis   History of schizoaffective disorder  Per Psychiatry, patient is well-known to them and has been diagnosed with schizoaffective disorder previously. Patient's willingness to engage in evaluation and discussing other treatment options is limited by her current mental status. Patient has taken antipsychotics before during previous psychiatric hospitalizations, but was not taking any psychotropic medications prior to this admission. Discussed with Psychiatry (see note), they recommended restarting patient on antipsychotic with an as-needed agitation protocol if her mental status deteriorates. Patient does not meet criteria for involuntary medication administration at this time--her interactions so far has been limited  to declining evaluations and has not shown any signs of aggression or harm to self or others. Will continue  to monitor patient for mental status improvement in order to discuss possible treatment options for her presenting problem.   -Haldol tablet '10mg'$  QHS -If mental status deteriorates with aggression or harm to self/others, may initiate agitation protocol Haldol '5mg'$  tablet PRN or IM Haldol '5mg'$  q6h PRN Benadryl '50mg'$  capsule or IM Benadryl '50mg'$  q6h PRN Lorazepam '2mg'$  tablet or IM Lorazepam '2mg'$  q6h PRN  Full code Regular Diet DVT: SCDs IVF: none   Dispo: Admit patient to Observation with expected length of stay less than 2 midnights.    LOS: 0 days   Valerie Howard, Medical Student 09/09/2022, 11:12 AM

## 2022-09-09 NOTE — Progress Notes (Signed)
Patient refused to take her haldol. Pt became verbally aggressive while being educated on importance of taking her prescribed meds. Patient stated "apparently everyone  has mental problems in this hospital". Will continue to monitor.

## 2022-09-09 NOTE — Consult Note (Addendum)
[facility] New Face-to-Face Psychiatric Evaluation  Service Date: September 09, 2022 LOS:  LOS: 0 days   Assessment  Valerie SHIPLETT is a 47 y.o. female admitted medically for 09/07/2022  9:14 PM for heavy vaginal bleeding. She carries the psychiatric diagnosis of schizoaffective disorder and has a past medical history of vaginal bleeding, fibroids, menorrhagia, anemia requiring transfusion, homelessness . Consult / Liaison Psychiatry was consulted for management of paranoia and disorganized thought process suspected to be secondary to untreated psychosis that is negatively affecting her medical care by Dr. Delene Ruffini  from the internal medicine teaching service ([facility]).  Patient has a history of schizoaffective disorder. She is demonstrating persecutory delusions towards medical staff as well as disorganized thought processes observed by the primary team. She does not appear to be responding to external or internal stimuli. She is noticeably irritable, but does not appear to meet criteria for a manic episode at this time.  The patient interview was limited due to patient's hostility and anger towards the psychiatry consult team, asking [place] several times to leave, stating, "I don't trust you (referring to this Probation officer). You can go now." She believes "you psychiatrists caused all these (unspecified) problems - I don't want to hear about any mental health shit"  Psychotropic medications: Current (prescribed prior to admission) None - patient non-adherent to psychotropic medications  Past haloperidol divalproex olanzapine ziprasidone risperidone lorazepam temazepam gabapentin trazodone  Diagnoses:  Active Hospital problems: Principal Problem:   Abnormal uterine bleeding    Plan  ## Safety and Observation Level:  - Consult request was unrelated to self-harm. Based on my clinical evaluation, I estimate the patient to be at low risk of self harm in the current  setting. - At this time, we recommend a no additional level of observation. This decision is based on my review of the chart including patient's history and current presentation, interview of the patient, mental status examination, and consideration of suicide risk including evaluating suicidal ideation, plan, intent, suicidal or self-harm behaviors, risk factors, and protective factors. This judgment is based on our ability to directly address suicide risk, implement suicide prevention strategies and develop a safety plan while the patient is in the clinical setting. - Please contact our team if there is a concern that risk level has changed  ## Interventions (medications, psychoeducation, etc):  - start haloperidol 10 mg at bedtime to treat persecutory delusions, irritability - start agitation protocol: haloperidol 5 mg PO or IM PRN for agitation and aggression lorazepam 2 mg PO or IM PRN for agitation and aggression diphenhydramine 50 mg PO or IM PRN for agitation, aggression, or for acute dystonic reactions  Agitation and aggression, both of which may result in harm to self or others, is separate from refusal of care and hostility towards staff  ## Medical Decision Making Capacity:  - Not formally assessed during this encounter  ## Further Work-up: - most recent EKG on 08/05/2022 had QTc of 433 - pertinent labwork reviewed earlier this admission includes: CBC, chem profile, TSH, respiratory viral panel, and urinalysis, vitamin B12  ## Disposition:  - No indication for inpatient psychiatric admission. Recommending outpatient psychiatry / psychology follow-up. Defer immediate disposition plan to primary team.  ## Behavioral / Environmental:  -- Standard delirium precautions and Fall precautions  ##Legal Status -- Patient voluntary -- Patient does not meet criteria for involuntary commitment or forced medications. She may refuse medications if she so chooses  Thank you for this consult  request.  Our recommendations are listed above.  We will continue to follow patient's hospital course  Camelia Phenes, MD  NEW history  Relevant Aspects of Hospital Course:  Admitted on 09/07/2022 for heavy vaginal bleeding.  Patient Report:  Patient does not trust psychiatrists and this Probation officer in particular (she was more open to speaking with Dr. Leverne Humbles). She believes "mental health people" have caused her harm - she does not elaborate on the harm experienced. She states she is open to speaking with ObGyn to discuss treatment of her vaginal bleeding. She states she has 4 kids and 2 grandchildren. Her driver's license was recently revoked.  ROS Unable to obtain  Past Psychiatric History:  Previous psych diagnoses:  schizoaffective disorder Prior inpatient psychiatric treatment:  multiple prior inpatient psychiatric admissions to [facility]  dating back to 2011  Social History:  Children: patient has 4 children, 2 grandchildren  Substance Use History: Alcohol: unable to obtain Other illicit substances: unable to obtain, though past urine toxicologies have been negative for illicit substances  Family History:  Medical: unable to obtain Psych: unable to obtain Suicide: unable to obtain Substance use family hx: unable to obtain The patient's family history includes Breast cancer in her maternal grandmother; Cancer in an other family member; Diabetes in an other family member; Hypertension in an other family member; Keloids in her mother.  Medical History: Past Medical History:  Diagnosis Date   History of radiation therapy 07/18/17-07/24/17   right ear, operative bed 12 Gy in 3 fractions   Keloid    right ear   Paranoid behavior ([place])    Schizophrenia ([place])     Surgical History: Past Surgical History:  Procedure Laterality Date   CESAREAN SECTION     KENALOG INJECTION Bilateral 07/17/2017   Procedure: KENALOG INJECTION;  Surgeon: Wallace Going, DO;  Location: [place];  Service: Plastics;  Laterality: Bilateral;   MASS EXCISION N/A 07/17/2017   Procedure: EXCISION KELOID OF RIGHT EAR WITH INTROPERATIVE KENALOG INJECTION AND POST OP RADIATION;  Surgeon: Wallace Going, DO;  Location: [place];  Service: Plastics;  Laterality: N/A;   TUBAL LIGATION      Medications:   Current Facility-Administered Medications:    0.9 %  sodium chloride infusion (Manually program via Guardrails IV Fluids), , Intravenous, Once, Sherwood Gambler, MD, Held at 09/08/22 1505   acetaminophen (TYLENOL) tablet 650 mg, 650 mg, Oral, Q6H PRN, 650 mg at 09/09/22 0659 **OR** acetaminophen (TYLENOL) suppository 650 mg, 650 mg, Rectal, Q6H PRN, Delene Ruffini, MD   ferrous sulfate tablet 325 mg, 325 mg, Oral, Q breakfast, Delene Ruffini, MD, 325 mg at 09/09/22 4076   megestrol (MEGACE) tablet 80 mg, 80 mg, Oral, BID, Sherwood Gambler, MD, 80 mg at 09/08/22 2110   potassium chloride SA (KLOR-CON M) CR tablet 40 mEq, 40 mEq, Oral, Q8H, Gaylan Gerold, DO   senna-docusate (Senokot-S) tablet 2 tablet, 2 tablet, Oral, QHS, Delene Ruffini, MD  Allergies: Allergies  Allergen Reactions   Penicillins Hives and Itching    Has patient had a PCN reaction causing immediate rash, facial/tongue/throat swelling, SOB or lightheadedness with hypotension:Patient refuses to answer (PRA) Has patient had a PCN reaction causing severe rash involving mucus membranes or skin necrosis:PRA Has patient had a PCN reaction that required hospitalization PRA Has patient had a PCN reaction occurring within the last 10 years:PRA If all of the above answers are "NO", then may proceed with Cephalosporin use.     Objective  Vital  signs:  Temp:  [98.4 F (36.9 C)-99.9 F (37.7 C)] 98.5 F (36.9 C) (01/12 0417) Pulse Rate:  [85-113] 85 (01/12 0417) Resp:  [14-20] 18 (01/12 0417) BP: (99-135)/(60-98) 133/76 (01/12 0417) SpO2:  [98 %-100 %] 99 % (01/12 0417)  Mental Status  Exam: This patient interview was conducted in-person in the presence of the resident physician and attending physician. I personally interviewed the patient with members of the team contributing their own questions, and by the end of the interview, established poor rapport with the patient.  Basic Cognition: Valerie Howard is alert  Appearance and Grooming: 34 female with documented age of 47 y.o. who presents congruently to her documented sex. The patient generally appears well and nourished. She has a/n obese habitus with a/n average build. Patient is laying in bed in a/n  laid back  posture; she appears as documented age.  Behavior: The patient appears in no acute distress, and during the interview, was agitated, as evidenced by refusing to speak with psychiatry team and telling us to leave . Shemade minimal eye contact.  The patient did not appear internally or externally preoccupied.  Attitude: Patient's attitude towards the interviewer was irritable, as evidenced by repeatedly interrupting this interviewer and refusing to let me speak, suspicious, as evidenced by expressing mistrust towards this interviewer, and hostile, as evidenced by telling this interviewer to leave the room .  Motor activity: The patient's movement speed was normal. Her gait was not observed during encounter. There was no notable abnormal facial movements and no notable abnormal extremity movements.  Speech: The patient's speech was clear and fluent. The volume of her speech was normal and normal in quantity. The rate was normal with a normal rhythm. Responses were normal in latency. There were no abnormal patterns in speech.  Mood: "I don't trust you. You can leave now."  Affect: Patient's affect is irritable and angry with broad range and even fluctuations. Her affect is inappropriate for the topic of conversation, as evidenced by refusal to answer simple questions regarding her care  .  ------------------------------------------------------------------------------------------------------------------------- Perception Unable to be assessed  Thought Content The patient describes persecutory delusions, specifically that psychiatrists have caused her harm .  Patient  terminated interview early and did not answer questions pertaining to SI or HI .  Thought Process The patient's thought process is linear and is goal-directed.  Insight The patient at the time of interview demonstrates poor insight, as evidenced by lacking understanding of mental health condition/s, inability to identify trigger/s causing mental health decompensation, and inability to identify adaptive and maladaptive coping strategies.  Judgement The patient over the past 24 hours demonstrates fair judgement, as evidenced by adhering to medication regimen and engaging inappropriately with staff / other patients.  Expanded Cognitive Exam: A more comprehensive cognitive exam is not indicated at this time.  Assets  Assets:No data recorded  Sleep  Sleep:No data recorded  Physical Exam: General: well-appearing and in no acute distress HEENT: normocephalic and atraumatic Respiratory: normal respiratory effort and on RA Extremities: moving all extremities spontaneously  Blood pressure 133/76, pulse 85, temperature 98.5 F (36.9 C), temperature source Oral, resp. rate 18, height '5\' 7"'$  (1.702 m), weight 95.3 kg, last menstrual period 09/07/2022, SpO2 99 %. Body mass index is 32.89 kg/m.

## 2022-09-10 DIAGNOSIS — D509 Iron deficiency anemia, unspecified: Secondary | ICD-10-CM | POA: Diagnosis not present

## 2022-09-10 DIAGNOSIS — D259 Leiomyoma of uterus, unspecified: Secondary | ICD-10-CM

## 2022-09-10 DIAGNOSIS — N939 Abnormal uterine and vaginal bleeding, unspecified: Secondary | ICD-10-CM

## 2022-09-10 LAB — CBC
HCT: 28.6 % — ABNORMAL LOW (ref 36.0–46.0)
Hemoglobin: 8.3 g/dL — ABNORMAL LOW (ref 12.0–15.0)
MCH: 22 pg — ABNORMAL LOW (ref 26.0–34.0)
MCHC: 29 g/dL — ABNORMAL LOW (ref 30.0–36.0)
MCV: 75.7 fL — ABNORMAL LOW (ref 80.0–100.0)
Platelets: 362 10*3/uL (ref 150–400)
RBC: 3.78 MIL/uL — ABNORMAL LOW (ref 3.87–5.11)
RDW: 21.5 % — ABNORMAL HIGH (ref 11.5–15.5)
WBC: 6.6 10*3/uL (ref 4.0–10.5)
nRBC: 0.5 % — ABNORMAL HIGH (ref 0.0–0.2)

## 2022-09-10 LAB — BASIC METABOLIC PANEL
Anion gap: 9 (ref 5–15)
BUN: 9 mg/dL (ref 6–20)
CO2: 22 mmol/L (ref 22–32)
Calcium: 8.5 mg/dL — ABNORMAL LOW (ref 8.9–10.3)
Chloride: 108 mmol/L (ref 98–111)
Creatinine, Ser: 0.92 mg/dL (ref 0.44–1.00)
GFR, Estimated: >60 mL/min (ref >60)
Glucose, Bld: 97 mg/dL (ref 70–99)
Potassium: 3.4 mmol/L — ABNORMAL LOW (ref 3.5–5.1)
Sodium: 139 mmol/L (ref 135–145)

## 2022-09-10 MED ORDER — GUAIFENESIN 100 MG/5ML PO LIQD
5.0000 mL | ORAL | Status: DC | PRN
  Administered 2022-09-12: 5 mL via ORAL
  Filled 2022-09-10: qty 15

## 2022-09-10 MED ORDER — POTASSIUM CHLORIDE 20 MEQ PO PACK: 40.0000 meq | PACK | Freq: Once | ORAL | Status: DC

## 2022-09-10 NOTE — Consult Note (Signed)
[facility] New Face-to-Face Psychiatric Evaluation  Service Date: September 10, 2022 LOS:  LOS: 1 day   Assessment  Valerie Howard is a 47 y.o. female admitted medically for 09/07/2022  9:14 PM for heavy vaginal bleeding. She carries the psychiatric diagnosis of schizoaffective disorder and has a past medical history of vaginal bleeding, fibroids, menorrhagia, anemia requiring transfusion, homelessness . Consult / Liaison Psychiatry was consulted for management of paranoia and disorganized thought process suspected to be secondary to untreated psychosis that is negatively affecting her medical care by Dr. Delene Ruffini  from the internal medicine teaching service (IMTS).  Patient has a history of schizoaffective disorder, which is overall consistent with current presentation. She is demonstrating persecutory delusions towards medical staff as well as disorganized thought processes observed by the primary team. On exam she does not appear to be responding to external or internal stimuli. She is noticeably irritable. She has a long history of medication non-compliance and lack of interest in ongoing psychiatric care. She has both tolerated and benefited from haloperidol in the past, however has vociferously declined this on multiple occasions this admission. She has been relatively appropriate to staff when not discussing her mental health, and her acceptance/refusal of medications does follow an internal logic (accepting of iron, megace - even though she has a strong stated preference against pills she did take PO iron before it was moved to IV). At no point has she been physically aggressive, made threats of harm to self or others, etc.   She has the right to refuse medication and does not meet criteria for forced medications at this time.  There are two purposes for any psychiatric consultation in the hospital setting - first to treat mental illness where it exists, and second to prevent  the mental illness from interfering with the delivery of routine medical care. At this time I am going to discontinue the PO haldol - the chance that she accepts it is vanishingly low as is the benefit of 2-3 days of taking it inpt without outpatient followup, while the chance that it drives her away from seeking medical care in the future is real. For similar reasons I do not think it is appropriate to schedule IV antipsychotics.  We will leave her on the consult list while she is here and review chart in AM however do not plan to see again unless specifically asked.   The patient interview was again limited due to patient's hostility and anger towards the psychiatry consult team   Psychotropic medications: Current (prescribed prior to admission) None - patient non-adherent to psychotropic medications  Past haloperidol divalproex olanzapine ziprasidone risperidone lorazepam temazepam gabapentin trazodone  Diagnoses:  Active Hospital problems: Principal Problem:   Abnormal uterine bleeding Active Problems:   Personality disorder (HCC)   Schizoaffective disorder, bipolar type (HCC)   Paranoia (psychosis) ([place])   Symptomatic anemia   Homelessness    Plan  ## Safety and Observation Level:  - Consult request was unrelated to self-harm. Based on my clinical evaluation, I estimate the patient to be at low risk of self harm in the current setting. - At this time, we recommend a no additional level of observation. This decision is based on my review of the chart including patient's history and current presentation, interview of the patient, mental status examination, and consideration of suicide risk including evaluating suicidal ideation, plan, intent, suicidal or self-harm behaviors, risk factors, and protective factors. This judgment is based on our ability  to directly address suicide risk, implement suicide prevention strategies and develop a safety plan while the patient is in the  clinical setting. - Please contact our team if there is a concern that risk level has changed  ## Interventions (medications, psychoeducation, etc):  - STOP haloperidol 10 mg at bedtime (pt refused verbally, becomes agitated when this brought up, want to maintain alliance between pt and nursing staff/primary team) - start agitation protocol: haloperidol 5 mg PO or IM PRN for agitation and aggression lorazepam 2 mg PO or IM PRN for agitation and aggression diphenhydramine 50 mg PO or IM PRN for agitation, aggression, or for acute dystonic reactions  Agitation and aggression, both of which may result in harm to self or others, is separate from refusal of care and hostility towards staff  ## Medical Decision Making Capacity:  - Not formally assessed during this encounter  ## Further Work-up: - most recent EKG on 08/05/2022 had QTc of 433 - pertinent labwork reviewed earlier this admission includes: CBC, chem profile, TSH, respiratory viral panel, and urinalysis, vitamin B12  ## Disposition:  - No indication for inpatient psychiatric admission. Recommending outpatient psychiatry / psychology follow-up. Defer immediate disposition plan to primary team.  ## Behavioral / Environmental:  -- Standard delirium precautions and Fall precautions  ##Legal Status -- Patient voluntary -- Patient does not meet criteria for involuntary commitment or forced medications. She may refuse medications if she so chooses  Thank you for this consult request. Our recommendations are listed above.  We will continue to follow patient's hospital course  [place] history  Relevant Aspects of Hospital Course:  Admitted on 09/07/2022 for heavy vaginal bleeding.  Patient Report:  Patient reiterates her mistrust of psychiatrists. Feels her mental health issues are best dealt with by listening to R&B. Angry that every time she comes to the hospital it feels like people focus on mental health issues  over vaginal bleeding and wants to focus on that. Though notably irritable with decreased sense of social boundaries (interrupted interview to pee), pt was not overtly RIS. Talking loudly and rapidly, but not pressured - thoughts make sense in context, able to be interrupted, responds to question asked rather than picking up prior train of thought.   ROS Unable to obtain  Past Psychiatric History:  Previous psych diagnoses:  schizoaffective disorder Prior inpatient psychiatric treatment:  multiple prior inpatient psychiatric admissions to [facility]  dating back to 2011  Social History:  Children: patient has 4 children, 2 grandchildren  Substance Use History: Alcohol: unable to obtain Other illicit substances: unable to obtain, though past urine toxicologies have been negative for illicit substances  Family History:  Medical: unable to obtain Psych: unable to obtain Suicide: unable to obtain Substance use family hx: unable to obtain The patient's family history includes Breast cancer in her maternal grandmother; Cancer in an other family member; Diabetes in an other family member; Hypertension in an other family member; Keloids in her mother.  Medical History: Past Medical History:  Diagnosis Date   History of radiation therapy 07/18/17-07/24/17   right ear, operative bed 12 Gy in 3 fractions   Keloid    right ear   Paranoid behavior ([place])    Schizophrenia ([place])     Surgical History: Past Surgical History:  Procedure Laterality Date   CESAREAN SECTION     KENALOG INJECTION Bilateral 07/17/2017   Procedure: KENALOG INJECTION;  Surgeon: Wallace Going, DO;  Location: [facility];  Service: Clinical cytogeneticist;  Laterality: Bilateral;   MASS EXCISION N/A 07/17/2017   Procedure: EXCISION KELOID OF RIGHT EAR WITH INTROPERATIVE KENALOG INJECTION AND POST OP RADIATION;  Surgeon: Wallace Going, DO;  Location: [facility];  Service: Plastics;  Laterality: N/A;    TUBAL LIGATION      Medications:   Current Facility-Administered Medications:    0.9 %  sodium chloride infusion (Manually program via Guardrails IV Fluids), , Intravenous, Once, Sherwood Gambler, MD, Held at 09/08/22 1505   acetaminophen (TYLENOL) tablet 650 mg, 650 mg, Oral, Q6H PRN, 650 mg at 09/09/22 2106 **OR** acetaminophen (TYLENOL) suppository 650 mg, 650 mg, Rectal, Q6H PRN, Delene Ruffini, MD   ferric gluconate (FERRLECIT) 250 mg in sodium chloride 0.9 % 250 mL IVPB, 250 mg, Intravenous, Daily, Romana Juniper, MD, Last Rate: 135 mL/hr at 09/10/22 1133, 250 mg at 09/10/22 1133   guaiFENesin (ROBITUSSIN) 100 MG/5ML liquid 5 mL, 5 mL, Oral, Q4H PRN, Lottie Mussel, MD   haloperidol (HALDOL) tablet 10 mg, 10 mg, Oral, QHS, Gomez-Caraballo, Maria, MD   megestrol (MEGACE) tablet 80 mg, 80 mg, Oral, BID, Sherwood Gambler, MD, 80 mg at 09/10/22 1127   potassium chloride (KLOR-CON) packet 40 mEq, 40 mEq, Oral, Once, Delene Ruffini, MD   senna-docusate (Senokot-S) tablet 2 tablet, 2 tablet, Oral, QHS, Delene Ruffini, MD  Allergies: Allergies  Allergen Reactions   Penicillins Hives and Itching    Has patient had a PCN reaction causing immediate rash, facial/tongue/throat swelling, SOB or lightheadedness with hypotension:Patient refuses to answer (PRA) Has patient had a PCN reaction causing severe rash involving mucus membranes or skin necrosis:PRA Has patient had a PCN reaction that required hospitalization PRA Has patient had a PCN reaction occurring within the last 10 years:PRA If all of the above answers are "NO", then may proceed with Cephalosporin use.     Objective  Vital signs:  Temp:  [98.5 F (36.9 C)-99.2 F (37.3 C)] 98.5 F (36.9 C) (01/13 1556) Pulse Rate:  [87-100] 87 (01/13 1556) Resp:  [15-18] 16 (01/13 1556) BP: (135-157)/(77-86) 135/84 (01/13 1556) SpO2:  [99 %-100 %] 99 % (01/13 1556)  Mental Status Exam: This patient interview was conducted  in-person in the presence of the attending physician.   Basic Cognition: Lorenda Hatchet is alert  Appearance and Grooming: 59 female with documented age of 47 y.o. who presents congruently to her documented sex. The patient generally appears well and nourished. She has a/n obese habitus with a/n average build. Patient is laying in bed in a/n  tense  posture; she appears as documented age.  Behavior: The patient appears in no acute distress, and during the interview, was agitated, as evidenced by asking psychiatry team to lead, increased volume of voice . Shemade minimal eye contact.  The patient did not appear internally or externally preoccupied.  Attitude: Patient's attitude towards the interviewer was irritable and suspicious  Motor activity: The patient's movement speed was normal. Her gait was steady. There was no notable abnormal facial movements and no notable abnormal extremity movements.  Speech: The patient's speech was clear and fluent, although loud The rate was increased with a normal rhythm. Responses were normal in latency. There were no abnormal patterns in speech.  Mood: "I'm angry I keep talking to psychiatrists"  Affect: Patient's affect is irritable and angry with broad range and even fluctuations. Her affect is generally appropriate to stated mood within the context of distrust of psychiatrists.   ------------------------------------------------------------------------------------------------------------------------- Perception Unable to be  assessed  Thought Content The patient describes a belief that psychiatrists have harmed her; unclear if this is persecutory delusion or medicla trauma  Patient  denied SI/HI  Thought Process The patient's thought process is linear and is goal-directed.  Insight The patient at the time of interview demonstrates poor insight, as evidenced by lacking understanding of mental health condition/s, inability to identify  trigger/s causing mental health decompensation, and inability to identify adaptive and maladaptive coping strategies.  Judgement The patient over the past 24 hours demonstrates fair judgement, as evidenced by adhering to essential parts of medication regimen and engaging inappropriately with staff / other patients.  Expanded Cognitive Exam: A more comprehensive cognitive exam is not indicated at this time.  Assets  Assets: Help-seeking  Sleep  Sleep:Unclear   Physical Exam: General: well-appearing and in no acute distress HEENT: normocephalic and atraumatic Respiratory: normal respiratory effort and on RA Extremities: moving all extremities spontaneously  ROS: Could not complete expanded ROS d/t hostility  Blood pressure 135/84, pulse 87, temperature 98.5 F (36.9 C), temperature source Oral, resp. rate 16, height '5\' 7"'$  (1.702 m), weight 95.3 kg, last menstrual period 09/07/2022, SpO2 99 %. Body mass index is 32.89 kg/m.

## 2022-09-10 NOTE — Progress Notes (Addendum)
Subjective:  Patient is resting comfortably in bed. She states the vaginal bleeding has improved with Megace and that she is peeing a lot. Patient declined physical exam.   Overnight she refused PO Haldol.   Objective:  Vital signs in last 24 hours: Vitals:   09/09/22 1534 09/09/22 2043 09/10/22 0329 09/10/22 0719  BP: (!) 143/121 (!) 148/80 (!) 157/77 (!) 142/86  Pulse: (!) 104 91 100 93  Resp: '16 18 17 15  '$ Temp: 98.7 F (37.1 C) 98.6 F (37 C) 99.2 F (37.3 C) 98.9 F (37.2 C)  TempSrc: Oral Oral Oral Oral  SpO2: 99% 99% 100% 100%  Weight:      Height:       Weight change:   Intake/Output Summary (Last 24 hours) at 09/10/2022 1329 Last data filed at 09/09/2022 1814 Gross per 24 hour  Intake 301.82 ml  Output --  Net 301.82 ml   Physical Exam General: Alert, in no acute distress. HEENT: Normocephalic Neuro: Alert.  Psych: Irritable affect. Speech is rapid but interruptable. Mostly answers questions appropriately.    Assessment/Plan:  Principal Problem:   Abnormal uterine bleeding Active Problems:   Personality disorder (HCC)   Schizoaffective disorder, bipolar type ([place])   Paranoia (psychosis) ([place])   Symptomatic anemia   Homelessness  Valerie Howard is a 47 year old woman G3P4 with past medical history significant for vaginal bleeding, fibroids, menorrhagia, microcytic anemia, homelessness, and schizoaffective disorder who presents for heavy vaginal bleeding and admitted for anemia in the setting of acute on chronic blood loss.    Vaginal Bleeding - improving Microcytic Anemia, likely iron-deficiency anemia in setting of chronic blood loss - improving Uterine Fibroids Patient has remained hemodynamically stable, Hgb 8.3 today. Transabdominal U/S showed multiple lobular areas in the uterus consistent with fibroids, as well as calcifications along myometrium. Overall patient's hemoglobin is stable s/p 1x pRBC transfusion on admission and we are repleting  with IV iron, however more definitive treatment with IR embolization would be ideal. Discussed with Gyn who may attempt to reevaluate as patient's mental status allows, will follow if they have any further recommendations. We will continue to monitor patient's hgb and bleeding for the next couple days. If she remains stable with no further evidence of bleed, would be able to discharge with appropriate follow up if she continues to decline inpatient procedures. -F/u Gyn recommendations -Megace '80mg'$  BID -IV Ferrlecit '250mg'$  once daily for 5 days (2/5) -F/u outpatient gynecology for endometrial biopsy -Avoid pharmacologic DVT prophylaxis   History of schizoaffective disorder   Patient does not meet criteria for involuntary medication administration at this time--her interactions so far have been limited to declining evaluations and has not shown any signs of aggression or harm to self or others. Patient declined haldol last night, stated that "she does not need it." Discussed with Psychiatry that they are willing to be involved to evaluate patient's capacity if she desired to move forward with a procedure during this admission. Will continue to monitor patient for mental status improvement. -Haldol tablet '10mg'$  QHS per Psychiatry -If mental status deteriorates with aggression or harm to self/others, may initiate agitation protocol per Psychiatry Haldol '5mg'$  tablet PRN or IM Haldol '5mg'$  q6h PRN Benadryl '50mg'$  capsule or IM Benadryl '50mg'$  q6h PRN Lorazepam '2mg'$  tablet or IM Lorazepam '2mg'$  q6h PRN  Hypokalemia K+ 3.4 today. Patient declined Kcl tablet yesterday, will attempt to replete with Kcl packet today.  -Kcl 36mq packet -BMP tomorrow    LOS: 1  day   Spero Curb, Medical Student 09/10/2022, 1:29 PM  Attestation for Student Documentation: I personally was present and performed or re-performed the history, physical exam and medical decision-making activities of this service and have verified that  the service and findings are accurately documented in the student's note. Delene Ruffini, MD 09/10/2022, 4:55 PM

## 2022-09-11 DIAGNOSIS — D259 Leiomyoma of uterus, unspecified: Secondary | ICD-10-CM | POA: Diagnosis not present

## 2022-09-11 DIAGNOSIS — D509 Iron deficiency anemia, unspecified: Secondary | ICD-10-CM | POA: Diagnosis not present

## 2022-09-11 DIAGNOSIS — N939 Abnormal uterine and vaginal bleeding, unspecified: Secondary | ICD-10-CM | POA: Diagnosis not present

## 2022-09-11 LAB — CBC
HCT: 29.2 % — ABNORMAL LOW (ref 36.0–46.0)
Hemoglobin: 8.8 g/dL — ABNORMAL LOW (ref 12.0–15.0)
MCH: 22.9 pg — ABNORMAL LOW (ref 26.0–34.0)
MCHC: 30.1 g/dL (ref 30.0–36.0)
MCV: 75.8 fL — ABNORMAL LOW (ref 80.0–100.0)
Platelets: 371 10*3/uL (ref 150–400)
RBC: 3.85 MIL/uL — ABNORMAL LOW (ref 3.87–5.11)
RDW: 22.5 % — ABNORMAL HIGH (ref 11.5–15.5)
WBC: 10.4 10*3/uL (ref 4.0–10.5)
nRBC: 0.9 % — ABNORMAL HIGH (ref 0.0–0.2)

## 2022-09-11 LAB — BASIC METABOLIC PANEL
Anion gap: 10 (ref 5–15)
BUN: 8 mg/dL (ref 6–20)
CO2: 20 mmol/L — ABNORMAL LOW (ref 22–32)
Calcium: 8.8 mg/dL — ABNORMAL LOW (ref 8.9–10.3)
Chloride: 108 mmol/L (ref 98–111)
Creatinine, Ser: 0.82 mg/dL (ref 0.44–1.00)
GFR, Estimated: >60 mL/min (ref >60)
Glucose, Bld: 92 mg/dL (ref 70–99)
Potassium: 3.6 mmol/L (ref 3.5–5.1)
Sodium: 138 mmol/L (ref 135–145)

## 2022-09-11 NOTE — Progress Notes (Signed)
Patient called nurses' station and stated she had thrown up. Nurse went into room to offer nausea medication. At this time the patient stated "I just threw up in the trash can. I just need you to get out of my face." Nurse left the room. Will pass above to oncoming nurse.

## 2022-09-11 NOTE — Progress Notes (Signed)
   Subjective:  Seen and evaluated bedside. Does endorse mild bleeding overnight. Megace has been working well otherwise. Not having any pain. Declines physical exam. Remains irritable and stand-offish towards staff.   NAEO  Objective:  Vital signs in last 24 hours: Vitals:   09/10/22 1556 09/10/22 2024 09/11/22 0501 09/11/22 0742  BP: 135/84 (!) 140/85 (!) 140/81 135/89  Pulse: 87 80 82 96  Resp: '16 16 19 18  '$ Temp: 98.5 F (36.9 C) 98.8 F (37.1 C) 98.5 F (36.9 C) 98.2 F (36.8 C)  TempSrc: Oral Oral Oral Oral  SpO2: 99% 100% 99% 100%  Weight:      Height:       Weight change:  No intake or output data in the 24 hours ending 09/11/22 1330  Physical Exam General: Alert, in no acute distress. HEENT: Normocephalic Neuro: Alert.  Psych: Irritable affect. Mostly answers questions appropriately. Tangential thought  Assessment/Plan:  Principal Problem:   Abnormal uterine bleeding Active Problems:   Personality disorder (HCC)   Schizoaffective disorder, bipolar type ([place])   Paranoia (psychosis) ([place])   Symptomatic anemia   Homelessness  Valerie Howard is a 47 year old woman G3P4 with past medical history significant for vaginal bleeding, fibroids, menorrhagia, microcytic anemia, homelessness, and schizoaffective disorder who presents for heavy vaginal bleeding and admitted for anemia in the setting of acute on chronic blood loss.    Vaginal Bleeding  Microcytic Anemia, likely iron-deficiency anemia in setting of chronic blood loss - improving Uterine Fibroids Still having some minimal bleeding. Hgb has remained stable. Her mental status makes it difficult to engage with her and discuss treatment options and provide optimal care. GYN has not been able to evaluate her this admission. At this time, she still has capacity to make her own decisions and is not sick enough to be deemed incompetent. Since she has remained stable from a bleeding perspective, if she does not agree  to definitive intervention tomorrow, anticipate she will be discharged with instructions for outpatient follow up.  -Megace '80mg'$  BID -IV Ferrlecit '250mg'$  once daily for 5 days (3/5) -F/u outpatient gynecology for endometrial biopsy -Avoid pharmacologic DVT prophylaxis   History of schizoaffective disorder   Remains irritable and standoffish with staff, but no threat to self or other at this time. Patient does not meet criteria for involuntary medication administration. If mental status deteriorates with aggression or harm to self/others, may initiate agitation protocol per Psychiatry. Her mental status remains limiting factor in care.  Will need outpatient psych follow up as patients mental status permits.  - stop haldol per psych  Hypokalemia Stable after repletion.  -Kcl 23mq packet -BMP tomorrow    LOS: 2 days   GDelene Ruffini MD 09/11/2022, 1:30 PM

## 2022-09-12 ENCOUNTER — Emergency Department (HOSPITAL_COMMUNITY)
Admission: EM | Admit: 2022-09-12 | Discharge: 2022-09-13 | Disposition: A | Discharge status: Home / Self Care | Payer: Medicaid Other | Attending: Emergency Medicine | Admitting: Emergency Medicine

## 2022-09-12 ENCOUNTER — Emergency Department (HOSPITAL_COMMUNITY): Payer: Medicaid Other

## 2022-09-12 ENCOUNTER — Other Ambulatory Visit (HOSPITAL_COMMUNITY): Payer: Self-pay

## 2022-09-12 DIAGNOSIS — Z1152 Encounter for screening for COVID-19: Secondary | ICD-10-CM | POA: Insufficient documentation

## 2022-09-12 DIAGNOSIS — N939 Abnormal uterine and vaginal bleeding, unspecified: Secondary | ICD-10-CM | POA: Diagnosis not present

## 2022-09-12 DIAGNOSIS — J210 Acute bronchiolitis due to respiratory syncytial virus: Secondary | ICD-10-CM | POA: Insufficient documentation

## 2022-09-12 DIAGNOSIS — R058 Other specified cough: Secondary | ICD-10-CM | POA: Diagnosis present

## 2022-09-12 LAB — BASIC METABOLIC PANEL
Anion gap: 10 (ref 5–15)
BUN: 11 mg/dL (ref 6–20)
CO2: 24 mmol/L (ref 22–32)
Calcium: 9.2 mg/dL (ref 8.9–10.3)
Chloride: 103 mmol/L (ref 98–111)
Creatinine, Ser: 1.05 mg/dL — ABNORMAL HIGH (ref 0.44–1.00)
GFR, Estimated: >60 mL/min (ref >60)
Glucose, Bld: 104 mg/dL — ABNORMAL HIGH (ref 70–99)
Potassium: 4.1 mmol/L (ref 3.5–5.1)
Sodium: 137 mmol/L (ref 135–145)

## 2022-09-12 LAB — CBC
HCT: 33.1 % — ABNORMAL LOW (ref 36.0–46.0)
HCT: 33.8 % — ABNORMAL LOW (ref 36.0–46.0)
Hemoglobin: 9.9 g/dL — ABNORMAL LOW (ref 12.0–15.0)
Hemoglobin: 9.7 g/dL — ABNORMAL LOW (ref 12.0–15.0)
MCH: 22.9 pg — ABNORMAL LOW (ref 26.0–34.0)
MCH: 22.7 pg — ABNORMAL LOW (ref 26.0–34.0)
MCHC: 29.9 g/dL — ABNORMAL LOW (ref 30.0–36.0)
MCHC: 28.7 g/dL — ABNORMAL LOW (ref 30.0–36.0)
MCV: 76.4 fL — ABNORMAL LOW (ref 80.0–100.0)
MCV: 79.2 fL — ABNORMAL LOW (ref 80.0–100.0)
Platelets: 364 10*3/uL (ref 150–400)
Platelets: 375 10*3/uL (ref 150–400)
RBC: 4.33 MIL/uL (ref 3.87–5.11)
RBC: 4.27 MIL/uL (ref 3.87–5.11)
RDW: 24.6 % — ABNORMAL HIGH (ref 11.5–15.5)
RDW: 25.2 % — ABNORMAL HIGH (ref 11.5–15.5)
WBC: 9.8 10*3/uL (ref 4.0–10.5)
WBC: 9.6 10*3/uL (ref 4.0–10.5)
nRBC: 0 % (ref 0.0–0.2)
nRBC: 0 % (ref 0.0–0.2)

## 2022-09-12 MED ORDER — BENZONATATE 100 MG PO CAPS
100.0000 mg | ORAL_CAPSULE | Freq: Three times a day (TID) | ORAL | 0 refills | Status: DC | PRN
  Filled 2022-09-12: qty 30, 10d supply, fill #0

## 2022-09-12 MED ORDER — BENZONATATE 100 MG PO CAPS: 100.0000 mg | ORAL_CAPSULE | Freq: Three times a day (TID) | ORAL | 0 refills | Status: DC | PRN

## 2022-09-12 MED ORDER — MEGESTROL ACETATE 40 MG PO TABS
80.0000 mg | ORAL_TABLET | Freq: Two times a day (BID) | ORAL | 0 refills | Status: DC
  Filled 2022-09-12: qty 120, 30d supply, fill #0

## 2022-09-12 MED ORDER — SODIUM CHLORIDE 0.9 % IV SOLN: INTRAVENOUS | Status: DC | PRN

## 2022-09-12 NOTE — Discharge Instructions (Signed)
Ms. Valerie Howard  You were admitted for anemia caused by abnormal bleeding from your uterus. You were treated with a blood transfusion and iron infusions. You were also treated with a medication called megestrol (Megace). We are discharging you home now that you are doing better. To help assist you on your road to recovery, I have written the following recommendations:   Finish 30 days of megestrol (Megace) and visit a gynecologist for follow up of your abnormal uterine bleeding. You may benefit from a procedure called embolization to stop further bleeding. You may also benefit from a surgery called hysterectomy if you don't plan on having children in the future.  Please call a doctor or return to the emergency department for heavy vaginal bleeding or signs of anemia including but not limited to fatigue, shortness of breath, chest pain, or dizziness/light-headedness on standing.  It was a privilege to be a part of your hospital care team, and I hope you feel better as a result of your stay.  All the best, Nani Gasser, MD

## 2022-09-12 NOTE — Discharge Summary (Signed)
Name: Valerie Howard MRN: [id number] DOB: 11-17-1975 47 y.o. PCP: Patient, No Pcp Per  Date of Admission: 09/07/2022  9:14 PM Date of Discharge: 09/12/2022 12:58 PM Attending Physician: Sid Falcon, MD  Discharge Diagnosis: Principal Problem:   Abnormal uterine bleeding Active Problems:   Personality disorder ([place])   Schizoaffective disorder, bipolar type ([place])   Paranoia (psychosis) ([place])   Symptomatic anemia   Homelessness   Discharge Medications: Allergies as of 09/12/2022       Reactions   Penicillins Hives, Itching   Has patient had a PCN reaction causing immediate rash, facial/tongue/throat swelling, SOB or lightheadedness with hypotension:Patient refuses to answer (PRA) Has patient had a PCN reaction causing severe rash involving mucus membranes or skin necrosis:PRA Has patient had a PCN reaction that required hospitalization PRA Has patient had a PCN reaction occurring within the last 10 years:PRA If all of the above answers are "NO", then may proceed with Cephalosporin use.        Medication List     STOP taking these medications    benzonatate 100 MG capsule Commonly known as: TESSALON   polyethylene glycol 17 g packet Commonly known as: MiraLax       TAKE these medications    megestrol 40 MG tablet Commonly known as: MEGACE Take 2 tablets (80 mg total) by mouth 2 (two) times daily.        Disposition and follow-up:   Valerie Howard is a 47 y.o. year old with history of uterine fibroids and schizoaffective disorder who was admitted for anemia due to heavy uterine bleeding. Her hospital course was complicated by difficulty engaging with healthcare staff. She was treated with megestrol, blood transfusions, and IV iron with improvement in her anemia. Unfortunately her complex psychosocial background including homelessness has made effective management of this chronic problem challenging.  Abnormal uterine bleeding Hgb 9.8 on discharge.  Ought to have endometrial biopsy and evaluation for more definitive therapy like uterine artery embolization versus hysterectomy. - 30 days of megestrol prescribed on discharge - Repeat CBC at follow-up - Recommended the patient follow-up with OB/GYN in the outpatient setting  Labs / imaging needed at time of follow-up: - CBC  Follow-up Appointments:  Follow-up Information     [facility]. Schedule an appointment as soon as possible for a visit.   Why: Please schedule a hospital follow up at the clinic in the next 7-10 days. Contact information: [address] [place] [phone] [facility] Course by problem list:  Abnormal uterine bleeding Long history of abnormal uterine bleeding secondary to uterine fibroids, leading to anemia and requirement for blood transfusions.  She has been evaluated by gynecology in the past and IR embolization and/or biopsy has been recommended however this patient declines these interventions.  Management of this problem is complicated by patient's homelessness and schizoaffective disorder.  During this hospitalization she was treated with Megace, 1 blood transfusion, and 5 doses of Ferrlecit with improvement in hemoglobin from approximately 7 to approximately 10.  Schizoaffective disorder She seems to suffer from persecutory delusions towards medical staff and was observed to have some disorganized thought processes by the admitting team.  Psychiatry was consulted for evaluation and made the determination that she does have capacity to refuse medical interventions and would not be appropriate for inpatient psychiatric admission.  Discharge Exam:  Patient  interview is limited by an unwillingness to engage.  She replies to questions in a terse and clipped manner.  She says that she is still bleeding, although the rate has slowed significantly.  She reports some  dizziness that is constant and not exacerbated by motion.  She has concerns about a persistent cough without sputum production.   Blood pressure (!) 122/106, pulse (!) 103, temperature 99.4 F (37.4 C), temperature source Oral, resp. rate 18, height '5\' 7"'$  (1.702 m), weight 95.3 kg, last menstrual period 09/07/2022, SpO2 99 %.  Sitting upright in bed Breathing regular and unlabored, lungs are clear to auscultation without wheezing Skin is warm and dry No gross neurologic deficits Mood is irritable, affect is concordant, behavior is paranoid, thoughts seem organized  Pertinent studies and procedures:   Latest Reference Range & Units 09/11/22 02:36  Sodium 135 - 145 mmol/L 138  Potassium 3.5 - 5.1 mmol/L 3.6  Chloride 98 - 111 mmol/L 108  CO2 22 - 32 mmol/L 20 (L)  Glucose 70 - 99 mg/dL 92  BUN 6 - 20 mg/dL 8  Creatinine 0.44 - 1.00 mg/dL 0.82  Calcium 8.9 - 10.3 mg/dL 8.8 (L)  Anion gap 5 - 15  10  GFR, Estimated >60 mL/min >60  (L): Data is abnormally low   Latest Reference Range & Units 09/12/22 10:22  WBC 4.0 - 10.5 K/uL 9.8  RBC 3.87 - 5.11 MIL/uL 4.33  Hemoglobin 12.0 - 15.0 g/dL 9.9 (L)  HCT 36.0 - 46.0 % 33.1 (L)  MCV 80.0 - 100.0 fL 76.4 (L)  MCH 26.0 - 34.0 pg 22.9 (L)  MCHC 30.0 - 36.0 g/dL 29.9 (L)  RDW 11.5 - 15.5 % 24.6 (H)  Platelets 150 - 400 K/uL 364  nRBC 0.0 - 0.2 % 0.0  (L): Data is abnormally low (H): Data is abnormally high  US pelvis (transabdominal) impression: Extremely limited examination as above. Enlarged multilobular uterus consistent with known history of fibroids. The endometrium is poorly seen as are the ovaries. With the limitation if there is further concern additional cross-sectional imaging workup is recommended such as female pelvic MRI when appropriate  Discharge Instructions:   Discharge Instructions      Valerie Howard  You were admitted for anemia caused by abnormal bleeding from your uterus. You were treated with a  blood transfusion and iron infusions. You were also treated with a medication called megestrol (Megace). We are discharging you home now that you are doing better. To help assist you on your road to recovery, I have written the following recommendations:   Finish 30 days of megestrol (Megace) and visit a gynecologist for follow up of your abnormal uterine bleeding. You may benefit from a procedure called embolization to stop further bleeding. You may also benefit from a surgery called hysterectomy if you don't plan on having children in the future.  Please call a doctor or return to the emergency department for heavy vaginal bleeding or signs of anemia including but not limited to fatigue, shortness of breath, chest pain, or dizziness/light-headedness on standing.  It was a privilege to be a part of your hospital care team, and I hope you feel better as a result of your stay.  All the best, Nani Gasser, MD       Signed: Nani Gasser MD 09/12/2022, 12:58 PM   Pager: [phone]

## 2022-09-12 NOTE — Progress Notes (Signed)
Patient received discharge instructions and medication from TOC. Patient requested blood pressure to be retaken after toileting due to dizziness reported earlier. Patient toileted and blood pressure charted. Patient reported no dizziness with ambulation. Patient stated verbal understanding with discharge instructions and medications.

## 2022-09-12 NOTE — Progress Notes (Signed)
This writer attempted to administer the patient's Ferric Gluconate.  The patient informed this writer that the IV port  she has in her right arm is painful and she would like to have a new IV placed.  This Probation officer informed Valerie Howard.  Per Valerie Howard she will contact the IV team to have this request taken care of.

## 2022-09-12 NOTE — ED Triage Notes (Signed)
Patient here for evaluation of lightheadedness and cough that started yesterday. Patient is alert, oriented, and in no apparent distress at this time.

## 2022-09-12 NOTE — ED Provider Triage Note (Signed)
Emergency Medicine Provider Triage Evaluation Note  Valerie Howard , a 47 y.o. female  was evaluated in triage.  Pt complains of dizziness, cough started yesterday, patient was recently admitted for dysfunctional uterine bleeding, anemia, reports that she is not having any bleeding anymore, this cough, lightheadedness started in the last 2 days.  She denies any chest pain, shortness of breath.,  She reports that she may feel like she has a fever chills but is not having a temperature when she checks.  She was tachypneic on initial evaluation in triage, she is not tachypneic when I am evaluating her.  Review of Systems  Positive: Cough, lightheadedness Negative: Chest pain, shortness of breath  Physical Exam  BP (!) 162/95   Pulse (!) 101   Temp 99 F (37.2 C) (Oral)   Resp (!) 28   LMP 09/07/2022   SpO2 100%  Gen:   Awake, no distress   Resp:  Normal effort  MSK:   Moves extremities without difficulty  Other:    Medical Decision Making  Medically screening exam initiated at 6:39 PM.  Appropriate orders placed.  Valerie Howard was informed that the remainder of the evaluation will be completed by another provider, this initial triage assessment does not replace that evaluation, and the importance of remaining in the ED until their evaluation is complete.  Workup initiated in triage    Valerie Howard 09/12/22 1839

## 2022-09-12 NOTE — TOC Transition Note (Signed)
TOC discharge meds left with nurse secretary at nursing station.

## 2022-09-12 NOTE — Consult Note (Signed)
  Patient is being discharged today. Psychiatry consult service will sign off at this time.

## 2022-09-13 ENCOUNTER — Other Ambulatory Visit: Payer: Self-pay

## 2022-09-13 LAB — RESP PANEL BY RT-PCR (RSV, FLU A&B, COVID)  RVPGX2
Influenza A by PCR: NEGATIVE
Influenza B by PCR: NEGATIVE
Resp Syncytial Virus by PCR: POSITIVE — AB
SARS Coronavirus 2 by RT PCR: NEGATIVE

## 2022-09-13 MED ORDER — ALBUTEROL SULFATE HFA 108 (90 BASE) MCG/ACT IN AERS
2.0000 | INHALATION_SPRAY | Freq: Four times a day (QID) | RESPIRATORY_TRACT | Status: DC
  Administered 2022-09-13: 2 via RESPIRATORY_TRACT
  Filled 2022-09-13: qty 6.7

## 2022-09-13 NOTE — ED Provider Notes (Signed)
[facility] EMERGENCY DEPARTMENT Provider Note   CSN: [id number] Arrival date & time: 09/12/22  1647     History  Chief Complaint  Patient presents with   Dizziness    Valerie Howard is a 47 y.o. female.  HPI Patient presents 1 day after being discharged now with concern for persistent cough, mild perspiration that she is worried may represent a fever.  She notes that she is taking benzoate, with some possible reduction in her cough.  Comes and present for over a month, including during recent hospitalization for dysfunctional uterine bleeding.  No current bleeding, no syncope, no lightheadedness, no chest pain.  With persistent cough in spite of hospitalization she presents for evaluation.    Home Medications Prior to Admission medications   Medication Sig Start Date End Date Taking? Authorizing Provider  benzonatate (TESSALON) 100 MG capsule Take 1 capsule (100 mg total) by mouth 3 (three) times daily as needed for cough. 09/12/22   Linus Galas, MD  megestrol (MEGACE) 40 MG tablet Take 2 tablets (80 mg total) by mouth 2 (two) times daily. 09/12/22   Nani Gasser, MD      Allergies    Penicillins    Review of Systems   Review of Systems  All other systems reviewed and are negative.   Physical Exam Updated Vital Signs BP 129/80   Pulse 93   Temp 98.4 F (36.9 C) (Oral)   Resp 16   LMP 09/07/2022   SpO2 98%  Physical Exam Vitals and nursing note reviewed.  Constitutional:      General: She is not in acute distress.    Appearance: She is well-developed.  HENT:     Head: Normocephalic and atraumatic.  Eyes:     Conjunctiva/sclera: Conjunctivae normal.  Pulmonary:     Effort: Pulmonary effort is normal. No respiratory distress.     Breath sounds: No stridor.  Abdominal:     General: There is no distension.  Skin:    General: Skin is warm and dry.     Comments: No perspiration evident  Neurological:     Mental Status: She is  alert and oriented to person, place, and time.     Cranial Nerves: No cranial nerve deficit.  Psychiatric:        Mood and Affect: Mood normal.     Comments: Perseverant on cough, concern for something being wrong.     ED Results / Procedures / Treatments   Labs (all labs ordered are listed, but only abnormal results are displayed) Labs Reviewed  RESP PANEL BY RT-PCR (RSV, FLU A&B, COVID)  RVPGX2 - Abnormal; Notable for the following components:      Result Value   Resp Syncytial Virus by PCR POSITIVE (*)    All other components within normal limits  CBC - Abnormal; Notable for the following components:   Hemoglobin 9.7 (*)    HCT 33.8 (*)    MCV 79.2 (*)    MCH 22.7 (*)    MCHC 28.7 (*)    RDW 25.2 (*)    All other components within normal limits  BASIC METABOLIC PANEL - Abnormal; Notable for the following components:   Glucose, Bld 104 (*)    Creatinine, Ser 1.05 (*)    All other components within normal limits    EKG None  Radiology DG Chest 2 View  Result Date: 09/12/2022 CLINICAL DATA:  Cough. EXAM: CHEST - 2 VIEW COMPARISON:  August 29, 2022 FINDINGS: The  heart size and mediastinal contours are within normal limits. Both lungs are clear. The visualized skeletal structures are unremarkable. IMPRESSION: No active cardiopulmonary disease. Electronically Signed   By: Virgina Norfolk M.D.   On: 09/12/2022 19:55    Procedures Procedures    Medications Ordered in ED Medications  albuterol (VENTOLIN HFA) 108 (90 Base) MCG/ACT inhaler 2 puff (2 puffs Inhalation Given 09/13/22 1043)    ED Course/ Medical Decision Making/ A&P                             Medical Decision Making Patient with multiple medical issues including schizophrenia, dysfunctional uterine bleeding presents 1 day after discharge for the latter, now with concern for ongoing cough which has been present for about 1 month.  She is awake, alert, afebrile, in no distress.  The triage complaint suggest  lightheadedness, she denies this to me.  Some suspicion for cough versus bronchitis, less likely pneumonia or COVID, though these are considerations.  No early evidence for bacteremia, sepsis.  Amount and/or Complexity of Data Reviewed External Data Reviewed: notes.    Details: Start summary from yesterday reviewed, admission for dysfunctional uterine bleeding with cessation of bleeding, stabilization of hemoglobin Labs:  Decision-making details documented in ED Course. Radiology:  Decision-making details documented in ED Course. ECG/medicine tests:  Decision-making details documented in ED Course.  Risk Prescription drug management.    Thank: Patient in no distress, awakens easily.  She is aware of all findings.  She has no increased work of breathing, no new complaints, we discussed RSV positive viral panel, implications.  No evidence for bacteremia, sepsis, patient will start albuterol, follow-up with primary care.        Final Clinical Impression(s) / ED Diagnoses Final diagnoses:  RSV (acute bronchiolitis due to respiratory syncytial virus)    Rx / DC Orders ED Discharge Orders     None         Carmin Muskrat, MD 09/13/22 1447

## 2022-09-13 NOTE — Discharge Instructions (Signed)
These use the provided albuterol every 4 hours for the next 2 days, then as needed for cough control.  Continue taking your previously prescribed Tessalon capsules as well. For additional symptom relief use Tylenol, ibuprofen.  Return here for concerning changes in your condition.

## 2022-09-24 ENCOUNTER — Encounter (HOSPITAL_COMMUNITY): Payer: Self-pay

## 2022-09-24 ENCOUNTER — Emergency Department (HOSPITAL_COMMUNITY)
Admission: EM | Admit: 2022-09-24 | Discharge: 2022-09-24 | Disposition: A | Discharge status: Home / Self Care | Payer: Medicaid Other | Attending: Emergency Medicine | Admitting: Emergency Medicine

## 2022-09-24 ENCOUNTER — Other Ambulatory Visit: Payer: Self-pay

## 2022-09-24 DIAGNOSIS — N939 Abnormal uterine and vaginal bleeding, unspecified: Secondary | ICD-10-CM | POA: Insufficient documentation

## 2022-09-24 LAB — BASIC METABOLIC PANEL
Anion gap: 7 (ref 5–15)
BUN: 9 mg/dL (ref 6–20)
CO2: 23 mmol/L (ref 22–32)
Calcium: 9.1 mg/dL (ref 8.9–10.3)
Chloride: 109 mmol/L (ref 98–111)
Creatinine, Ser: 0.91 mg/dL (ref 0.44–1.00)
GFR, Estimated: >60 mL/min (ref >60)
Glucose, Bld: 89 mg/dL (ref 70–99)
Potassium: 4.3 mmol/L (ref 3.5–5.1)
Sodium: 139 mmol/L (ref 135–145)

## 2022-09-24 LAB — CBC WITH DIFFERENTIAL/PLATELET
Abs Immature Granulocytes: 0.02 10*3/uL (ref 0.00–0.07)
Basophils Absolute: 0 10*3/uL (ref 0.0–0.1)
Basophils Relative: 0 %
Eosinophils Absolute: 0.1 10*3/uL (ref 0.0–0.5)
Eosinophils Relative: 1 %
HCT: 36.8 % (ref 36.0–46.0)
Hemoglobin: 10.9 g/dL — ABNORMAL LOW (ref 12.0–15.0)
Immature Granulocytes: 0 %
Lymphocytes Relative: 23 %
Lymphs Abs: 1.8 10*3/uL (ref 0.7–4.0)
MCH: 24.6 pg — ABNORMAL LOW (ref 26.0–34.0)
MCHC: 29.6 g/dL — ABNORMAL LOW (ref 30.0–36.0)
MCV: 83.1 fL (ref 80.0–100.0)
Monocytes Absolute: 0.5 10*3/uL (ref 0.1–1.0)
Monocytes Relative: 6 %
Neutro Abs: 5.4 10*3/uL (ref 1.7–7.7)
Neutrophils Relative %: 70 %
Platelets: 399 10*3/uL (ref 150–400)
RBC: 4.43 MIL/uL (ref 3.87–5.11)
RDW: 29.2 % — ABNORMAL HIGH (ref 11.5–15.5)
WBC: 7.8 10*3/uL (ref 4.0–10.5)
nRBC: 0 % (ref 0.0–0.2)

## 2022-09-24 MED ORDER — MEGESTROL ACETATE 40 MG PO TABS: 80.0000 mg | ORAL_TABLET | Freq: Three times a day (TID) | ORAL | 0 refills | Status: DC

## 2022-09-24 NOTE — ED Provider Notes (Signed)
[facility] Provider Note   CSN: [id number] Arrival date & time: 09/24/22  7867     History {Add pertinent medical, surgical, social history, OB history to HPI:1} Chief Complaint  Patient presents with  . Vaginal Bleeding    Valerie Howard is a 47 y.o. female.  HPI     47 year old female with history of schizoaffective disorder and fibroids comes in with chief complaint of vaginal bleeding.  Patient states that after she was discharged from the hospital she has started having some vaginal bleeding last week.  It stopped again but restarted this morning with spotting.  She is coming to the ER because she is agitated and irritated about having bleeding.  She does not want to wait until bleeding gets worse.  Patient was irritated from the beginning of our encounter.  She tells me that she is taking her Megace, but unable to tell me how often she is taking it.  She tells me that she has not seen the gynecologist after being discharged because of driver's license issues but then states that she prefers seeing her own gynecologist.  She also states that she does not want a pelvic exam and does not want to be treated as an experimental subject without discussion of any treatment or management.  Home Medications Prior to Admission medications   Medication Sig Start Date End Date Taking? Authorizing Provider  benzonatate (TESSALON) 100 MG capsule Take 1 capsule (100 mg total) by mouth 3 (three) times daily as needed for cough. 09/12/22   Linus Galas, MD  megestrol (MEGACE) 40 MG tablet Take 2 tablets (80 mg total) by mouth in the morning, at noon, and at bedtime. 09/24/22 10/24/22  Varney Biles, MD      Allergies    Penicillins    Review of Systems   Review of Systems  Physical Exam Updated Vital Signs BP 136/79 (BP Location: Right Arm)   Pulse 82   Temp 97.9 F (36.6 C) (Oral)   Resp 16   Ht '5\' 7"'$  (1.702 m)   Wt 94.8 kg    LMP 09/07/2022   SpO2 100%   BMI 32.73 kg/m  Physical Exam  ED Results / Procedures / Treatments   Labs (all labs ordered are listed, but only abnormal results are displayed) Labs Reviewed  CBC WITH DIFFERENTIAL/PLATELET - Abnormal; Notable for the following components:      Result Value   Hemoglobin 10.9 (*)    MCH 24.6 (*)    MCHC 29.6 (*)    RDW 29.2 (*)    All other components within normal limits  BASIC METABOLIC PANEL    EKG None  Radiology No results found.  Procedures Procedures  {Document cardiac monitor, telemetry assessment procedure when appropriate:1}  Medications Ordered in ED Medications - No data to display  ED Course/ Medical Decision Making/ A&P   {   Click here for ABCD2, HEART and other calculatorsREFRESH Note before signing :1}                          Medical Decision Making Amount and/or Complexity of Data Reviewed Labs: ordered.  Risk Prescription drug management.    Final Clinical Impression(s) / ED Diagnoses Final diagnoses:  Abnormal uterine bleeding (AUB)    Rx / DC Orders ED Discharge Orders          Ordered    megestrol (MEGACE) 40 MG tablet  3  times daily        09/24/22 1021

## 2022-09-24 NOTE — Discharge Instructions (Addendum)
You are seen in the emergency room for vaginal bleeding. Your blood count is still normal.  We discussed your case with the gynecologist.  For now, they recommend that you take your Megace that you have already been prescribed 3 times a day.  I have given you additional prescription in case you run out of your medications.  You will need to follow-up with your own gynecologist or the gynecologist at work at the [employer] for further management.  The phone number and address for the women's Center has been provided in this paperwork.

## 2022-09-24 NOTE — ED Triage Notes (Addendum)
[place] [facility] c/o vaginal bleeding since Nov of last year. Pt states she was also seen just last week for this and it stopped but then started again this morning.

## 2022-09-24 NOTE — ED Provider Notes (Addendum)
Patient is a 47 year old female with a history of abnormal uterine bleeding who presents with vaginal bleeding that began this morning.  Patient stated she is having puddles of blood but could not quantify how much.  Patient states she was seen before for this and has been unable to make any gynecological appointment as she does not have a driver's license.  Before I could ask any more questions patient states she wanted to be seen by another provider and so I was unable to obtain any more history.  Patient is alert and oriented to time, location, place and has decision-making capacity at this time.   Chuck Hint, PA-C 09/24/22 0937    Chuck Hint, PA-C 09/24/22 7564    Varney Biles, MD 09/24/22 1023

## 2022-09-30 ENCOUNTER — Encounter (HOSPITAL_COMMUNITY): Payer: Self-pay

## 2022-09-30 ENCOUNTER — Other Ambulatory Visit: Payer: Self-pay

## 2022-09-30 ENCOUNTER — Emergency Department (HOSPITAL_COMMUNITY)
Admission: EM | Admit: 2022-09-30 | Discharge: 2022-09-30 | Disposition: A | Discharge status: Home / Self Care | Payer: Medicaid Other | Attending: Student | Admitting: Student

## 2022-09-30 DIAGNOSIS — N939 Abnormal uterine and vaginal bleeding, unspecified: Secondary | ICD-10-CM | POA: Diagnosis present

## 2022-09-30 LAB — CBC WITH DIFFERENTIAL/PLATELET
Abs Immature Granulocytes: 0.02 10*3/uL (ref 0.00–0.07)
Basophils Absolute: 0 10*3/uL (ref 0.0–0.1)
Basophils Relative: 0 %
Eosinophils Absolute: 0.1 10*3/uL (ref 0.0–0.5)
Eosinophils Relative: 1 %
HCT: 38.8 % (ref 36.0–46.0)
Hemoglobin: 11.3 g/dL — ABNORMAL LOW (ref 12.0–15.0)
Immature Granulocytes: 0 %
Lymphocytes Relative: 38 %
Lymphs Abs: 2.8 10*3/uL (ref 0.7–4.0)
MCH: 24.6 pg — ABNORMAL LOW (ref 26.0–34.0)
MCHC: 29.1 g/dL — ABNORMAL LOW (ref 30.0–36.0)
MCV: 84.3 fL (ref 80.0–100.0)
Monocytes Absolute: 0.4 10*3/uL (ref 0.1–1.0)
Monocytes Relative: 5 %
Neutro Abs: 4 10*3/uL (ref 1.7–7.7)
Neutrophils Relative %: 56 %
Platelets: 345 10*3/uL (ref 150–400)
RBC: 4.6 MIL/uL (ref 3.87–5.11)
WBC: 7.3 10*3/uL (ref 4.0–10.5)
nRBC: 0 % (ref 0.0–0.2)

## 2022-09-30 LAB — BASIC METABOLIC PANEL
Anion gap: 9 (ref 5–15)
BUN: 12 mg/dL (ref 6–20)
CO2: 23 mmol/L (ref 22–32)
Calcium: 9.3 mg/dL (ref 8.9–10.3)
Chloride: 108 mmol/L (ref 98–111)
Creatinine, Ser: 0.88 mg/dL (ref 0.44–1.00)
GFR, Estimated: >60 mL/min (ref >60)
Glucose, Bld: 110 mg/dL — ABNORMAL HIGH (ref 70–99)
Potassium: 4.8 mmol/L (ref 3.5–5.1)
Sodium: 140 mmol/L (ref 135–145)

## 2022-09-30 MED ORDER — MEGESTROL ACETATE 40 MG PO TABS: 40.0000 mg | ORAL_TABLET | Freq: Every day | ORAL | Status: DC

## 2022-09-30 MED ORDER — MEGESTROL ACETATE 40 MG PO TABS
40.0000 mg | ORAL_TABLET | Freq: Every day | ORAL | Status: DC
  Administered 2022-09-30: 40 mg via ORAL
  Filled 2022-09-30: qty 1

## 2022-09-30 MED ORDER — MEGESTROL ACETATE 40 MG PO TABS: 40.0000 mg | ORAL_TABLET | Freq: Every day | ORAL | 0 refills | Status: DC

## 2022-09-30 NOTE — ED Provider Triage Note (Signed)
Emergency Medicine Provider Triage Evaluation Note  Valerie Howard , a 47 y.o. female  was evaluated in triage.  Pt complains of continued vaginal bleeding, she was seen on 09/24/2022 and was evaluated, she refused pelvic exam at that time but OB was consulted and recommended increasing Megace to 3 times a day.  Patient states that she is still having spotting especially when she coughs and thinks that the bleeding should have completely stopped by now.  She states that she is concerned somebody is "putting white powder on the medication and that makes you bleed", quite upset,  Review of Systems  Positive: Vaginal bleeding Negative: Abdominal pain  Physical Exam  BP (!) 149/99 (BP Location: Right Arm)   Pulse 77   Temp 99 F (37.2 C) (Oral)   Resp (!) 26   Ht '5\' 7"'$  (1.702 m)   Wt 90.7 kg   LMP 09/07/2022   SpO2 97%   BMI 31.32 kg/m  Gen:   Awake, no distress   Resp:  Normal effort  MSK:   Moves extremities without difficulty  Other:    Medical Decision Making  Medically screening exam initiated at 8:56 PM.  Appropriate orders placed.  Valerie Howard was informed that the remainder of the evaluation will be completed by another provider, this initial triage assessment does not replace that evaluation, and the importance of remaining in the ED until their evaluation is complete.     Valerie Howard, [place] 09/30/22 [phone]

## 2022-09-30 NOTE — ED Provider Notes (Signed)
[facility] Provider Note  CSN: [id number] Arrival date & time: 09/30/22 2040  Chief Complaint(s) Vaginal Bleeding  HPI Valerie Howard is a 47 y.o. female with PMH homelessness, schizophrenia, right ear keloid who presents emergency department for evaluation of vaginal bleeding.  The patient has been seen 5 previous times this month for vaginal bleeding with similar presentations to today.  Patient arrives with a prescription for Megace that is completely full.  She states she has not been taking this medicine because she is certain somebody tainted with the medication and is requesting new medications.  Patient frequently expressing just taste and disdain for the medical system and medical staff here in the emergency department, seemingly unprovoked.  She arrives stating "I just want you to fix me".  When asked for clarification, patient only responds with "I said what I said".    Past Medical History Past Medical History:  Diagnosis Date   History of radiation therapy 07/18/17-07/24/17   right ear, operative bed 12 Gy in 3 fractions   Keloid    right ear   Paranoid behavior ([place])    Schizophrenia ([place])    Patient Active Problem List   Diagnosis Date Noted   Homelessness 09/09/2022   Abnormal uterine bleeding 09/08/2022   Menorrhagia 08/03/2022   Influenza A 08/03/2022   Fibroids 07/28/2022   Acute on chronic blood loss anemia 07/28/2022   Symptomatic anemia 03/18/2020   Paranoia (psychosis) ([place])    Acute blood loss anemia 02/27/2019   Keloid scar 07/12/2017   Schizoaffective disorder, bipolar type ([place])    Menorrhagia with irregular cycle 05/12/2014   Personality disorder ([place]) 10/17/2012   Anemia, iron deficiency 10/13/2012   Tachycardia 10/01/2012   Home Medication(s) Prior to Admission medications   Medication Sig Start Date End Date Taking? Authorizing Provider  benzonatate (TESSALON) 100 MG capsule Take 1 capsule (100 mg  total) by mouth 3 (three) times daily as needed for cough. 09/12/22   Linus Galas, MD  megestrol (MEGACE) 40 MG tablet Take 2 tablets (80 mg total) by mouth in the morning, at noon, and at bedtime. 09/24/22 10/24/22  Varney Biles, MD                                                                                                                                    Past Surgical History Past Surgical History:  Procedure Laterality Date   CESAREAN SECTION     KENALOG INJECTION Bilateral 07/17/2017   Procedure: KENALOG INJECTION;  Surgeon: Wallace Going, DO;  Location: [facility];  Service: Plastics;  Laterality: Bilateral;   MASS EXCISION N/A 07/17/2017   Procedure: EXCISION KELOID OF RIGHT EAR WITH INTROPERATIVE KENALOG INJECTION AND POST OP RADIATION;  Surgeon: Wallace Going, DO;  Location: [facility];  Service: Plastics;  Laterality: N/A;   TUBAL LIGATION     Family History  Family History  Problem Relation Age of Onset   Hypertension Other    Diabetes Other    Cancer Other    Keloids Mother    Breast cancer Maternal Grandmother     Social History Social History   Tobacco Use   Smoking status: Never   Smokeless tobacco: Never  Vaping Use   Vaping Use: Never used  Substance Use Topics   Alcohol use: Yes    Comment: socially   Drug use: No   Allergies Penicillins  Review of Systems Review of Systems  Genitourinary:  Positive for vaginal bleeding.    Physical Exam Vital Signs  I have reviewed the triage vital signs BP (!) 149/99 (BP Location: Right Arm)   Pulse 77   Temp 99 F (37.2 C) (Oral)   Resp (!) 26   Ht '5\' 7"'$  (1.702 m)   Wt 90.7 kg   LMP 09/07/2022   SpO2 97%   BMI 31.32 kg/m   Physical Exam Vitals and nursing note reviewed.  Constitutional:      General: She is not in acute distress.    Appearance: She is well-developed.  HENT:     Head: Normocephalic and atraumatic.  Eyes:      Conjunctiva/sclera: Conjunctivae normal.  Cardiovascular:     Rate and Rhythm: Normal rate and regular rhythm.     Heart sounds: No murmur heard. Pulmonary:     Effort: Pulmonary effort is normal. No respiratory distress.  Musculoskeletal:        General: No swelling.     Cervical back: Neck supple.  Skin:    General: Skin is warm and dry.     Capillary Refill: Capillary refill takes less than 2 seconds.  Neurological:     Mental Status: She is alert.  Psychiatric:        Mood and Affect: Mood normal.     ED Results and Treatments Labs (all labs ordered are listed, but only abnormal results are displayed) Labs Reviewed  CBC WITH DIFFERENTIAL/PLATELET - Abnormal; Notable for the following components:      Result Value   Hemoglobin 11.3 (*)    MCH 24.6 (*)    MCHC 29.1 (*)    All other components within normal limits  BASIC METABOLIC PANEL - Abnormal; Notable for the following components:   Glucose, Bld 110 (*)    All other components within normal limits                                                                                                                          Radiology No results found.  Pertinent labs & imaging results that were available during my care of the patient were reviewed by me and considered in my medical decision making (see MDM for details).  Medications Ordered in ED Medications - No data to display  Procedures Procedures  (including critical care time)  Medical Decision Making / ED Course   This patient presents to the ED for concern of vaginal bleeding, this involves an extensive number of treatment options, and is a complaint that carries with it a high risk of complications and morbidity.  The differential diagnosis includes dysfunctional uterine bleeding, ectopic pregnancy, pregnancy/abortion/threatened  abortion, fibriods, PID, Adenomyosis/endometriosis, endometrial hyperplasia, Coagulopathy (von Willdebrand's), Hydatiform Mole  MDM: Patient seen emergency room for evaluation of vaginal bleeding.  External physical exam unremarkable.  Patient is declining pelvic exam at this time.  Laboratory evaluation with a stable hemoglobin of 11.3.  Patient is adamant that she has not been sexually active recently and has a history of tubal ligation and thus pregnancy test deferred.  Will give the patient a single dose of Megace here in the emergency department and I gave the patient a paper prescription for a new prescription for Megace as we are unable to perform meds to bed this late at night.  Patient handed me her old prescription of Megace and demanded that I dispose of it.  Due to concern for patient's current homelessness and ability to afford new medications, repeatedly asked the patient if she was sure she wanted me to get rid of her medications that would solve her vaginal bleeding problem.  She became very upset at me when I asked multiple times about this and I ultimately did send these back to the pharmacy for disposal.  Patient is hemodynamically stable and does not meet inpatient criteria for admission and thus she was discharged.  I placed an outpatient OB/GYN referral for the patient as well.   Additional history obtained:  -External records from outside source obtained and reviewed including: Chart review including previous notes, labs, imaging, consultation notes   Lab Tests: -I ordered, reviewed, and interpreted labs.   The pertinent results include:   Labs Reviewed  CBC WITH DIFFERENTIAL/PLATELET - Abnormal; Notable for the following components:      Result Value   Hemoglobin 11.3 (*)    MCH 24.6 (*)    MCHC 29.1 (*)    All other components within normal limits  BASIC METABOLIC PANEL - Abnormal; Notable for the following components:   Glucose, Bld 110 (*)    All other components  within normal limits       Medicines ordered and prescription drug management: No orders of the defined types were placed in this encounter.   -I have reviewed the patients home medicines and have made adjustments as needed  Critical interventions none    Cardiac Monitoring: The patient was maintained on a cardiac monitor.  I personally viewed and interpreted the cardiac monitored which showed an underlying rhythm of: NSR  Social Determinants of Health:  Factors impacting patients care include: Homeless   Reevaluation: After the interventions noted above, I reevaluated the patient and found that they have :stayed the same  Co morbidities that complicate the patient evaluation  Past Medical History:  Diagnosis Date   History of radiation therapy 07/18/17-07/24/17   right ear, operative bed 12 Gy in 3 fractions   Keloid    right ear   Paranoid behavior ([place])    Schizophrenia ([place])       Dispostion: I considered admission for this patient, but at this time she does not meet inpatient criteria for admission and she is safe for discharge with outpatient follow-up     Final Clinical Impression(s) / ED Diagnoses Final diagnoses:  None     @  Madlyn Frankel, MD 10/01/22 [phone]

## 2022-09-30 NOTE — ED Notes (Signed)
Patient refused discharge vital signs. Patient provided bus pass with discharge instructions and prescription.

## 2022-09-30 NOTE — ED Triage Notes (Addendum)
Pt BIB [facility]. Pt presents with vaginal bleeding since Nov of 2023. Pt has had multiple ED visits for same. EMS states pt was started on a medication, but it did not help. Denies lightheadedness, fatigue, or pelvic pain. While questioning pt she states, "it seems like the white paste they put in the food that causes the bleeding is now in the medication." When pt asked how many pads she is saturating pt states, "I can not answer that, my screws are not loose."   Pt also has complaints of nasal discharge and congestion with a productive cough.   EMS Vitals  160/p HR 70 SpO2 97%

## 2022-10-01 ENCOUNTER — Encounter (HOSPITAL_COMMUNITY): Payer: Self-pay

## 2022-10-01 ENCOUNTER — Emergency Department (HOSPITAL_COMMUNITY)
Admission: EM | Admit: 2022-10-01 | Discharge: 2022-10-01 | Disposition: A | Discharge status: Home / Self Care | Payer: Medicaid Other | Attending: Emergency Medicine | Admitting: Emergency Medicine

## 2022-10-01 ENCOUNTER — Emergency Department (HOSPITAL_COMMUNITY): Payer: Medicaid Other

## 2022-10-01 ENCOUNTER — Other Ambulatory Visit: Payer: Self-pay

## 2022-10-01 DIAGNOSIS — R051 Acute cough: Secondary | ICD-10-CM | POA: Diagnosis not present

## 2022-10-01 DIAGNOSIS — R059 Cough, unspecified: Secondary | ICD-10-CM | POA: Diagnosis present

## 2022-10-01 MED ORDER — BENZONATATE 100 MG PO CAPS
100.0000 mg | ORAL_CAPSULE | Freq: Once | ORAL | Status: AC
  Administered 2022-10-01: 100 mg via ORAL
  Filled 2022-10-01: qty 1

## 2022-10-01 MED ORDER — ACETAMINOPHEN 500 MG PO TABS: 500.0000 mg | ORAL_TABLET | Freq: Four times a day (QID) | ORAL | 0 refills | Status: AC | PRN

## 2022-10-01 MED ORDER — ACETAMINOPHEN 500 MG PO TABS
1000.0000 mg | ORAL_TABLET | Freq: Once | ORAL | Status: AC
  Administered 2022-10-01: 1000 mg via ORAL
  Filled 2022-10-01: qty 2

## 2022-10-01 MED ORDER — BENZONATATE 100 MG PO CAPS: 100.0000 mg | ORAL_CAPSULE | Freq: Three times a day (TID) | ORAL | 0 refills | Status: DC | PRN

## 2022-10-01 NOTE — ED Provider Notes (Signed)
[facility] Provider Note   CSN: [id number] Arrival date & time: 10/01/22  8182     History  Chief Complaint  Patient presents with   Cough    Valerie Howard is a 47 y.o. female.  The history is provided by the patient and medical records. No language interpreter was used.  Cough    47 year old female significant history of schizophrenia, paranoia, unhoused, anemia, presenting with complaints of cough.  Patient endorses a lingering cough for the past week.  She also endorsed having headache as well.  Endorses congestion.  Was diagnosed with RSV several weeks prior.  Cough still lingers.  No nausea vomit diarrhea.  Tried taking over-the-counter medication without relief.  No reported shortness of breath.  Home Medications Prior to Admission medications   Medication Sig Start Date End Date Taking? Authorizing Provider  benzonatate (TESSALON) 100 MG capsule Take 1 capsule (100 mg total) by mouth 3 (three) times daily as needed for cough. 09/12/22   Linus Galas, MD  megestrol (MEGACE) 40 MG tablet Take 2 tablets (80 mg total) by mouth in the morning, at noon, and at bedtime. 09/24/22 10/24/22  Varney Biles, MD  megestrol (MEGACE) 40 MG tablet Take 1 tablet (40 mg total) by mouth daily. 09/30/22 10/30/22  Kommor, Debe Coder, MD      Allergies    Penicillins    Review of Systems   Review of Systems  Respiratory:  Positive for cough.   All other systems reviewed and are negative.   Physical Exam Updated Vital Signs BP (!) 147/108 (BP Location: Right Arm)   Pulse 100   Temp 98.6 F (37 C)   Resp (!) 22   Ht '5\' 7"'$  (1.702 m)   Wt 90.7 kg   LMP 09/07/2022   SpO2 98%   BMI 31.32 kg/m  Physical Exam Vitals and nursing note reviewed.  Constitutional:      General: She is not in acute distress.    Appearance: She is well-developed.     Comments: Patient sitting out of bed appears comfortable, on her phone in no acute  discomfort.  HENT:     Head: Atraumatic.  Eyes:     Conjunctiva/sclera: Conjunctivae normal.  Cardiovascular:     Rate and Rhythm: Normal rate and regular rhythm.     Pulses: Normal pulses.     Heart sounds: Normal heart sounds.  Pulmonary:     Effort: Pulmonary effort is normal.     Breath sounds: No wheezing, rhonchi or rales.  Abdominal:     Palpations: Abdomen is soft.     Tenderness: There is no abdominal tenderness.  Musculoskeletal:     Cervical back: Neck supple.     Right lower leg: No edema.     Left lower leg: No edema.  Skin:    Findings: No rash.  Neurological:     Mental Status: She is alert. Mental status is at baseline.  Psychiatric:        Mood and Affect: Mood normal.     ED Results / Procedures / Treatments   Labs (all labs ordered are listed, but only abnormal results are displayed) Labs Reviewed - No data to display  EKG None  Radiology No results found.  Procedures Procedures    Medications Ordered in ED Medications - No data to display  ED Course/ Medical Decision Making/ A&P  Medical Decision Making Amount and/or Complexity of Data Reviewed Radiology: ordered.  Risk OTC drugs. Prescription drug management.   BP (!) 147/108 (BP Location: Right Arm)   Pulse 100   Temp 98.6 F (37 C)   Resp (!) 22   Ht '5\' 7"'$  (1.702 m)   Wt 90.7 kg   LMP 09/07/2022   SpO2 98%   BMI 31.32 kg/m   58:62 AM   47 year old female significant history of schizophrenia, paranoia, unhoused, anemia, presenting with complaints of cough.  Patient endorses a lingering cough for the past week.  She also endorsed having headache as well.  Endorses congestion.  Was diagnosed with RSV several weeks prior.  Cough still lingers.  No nausea vomit diarrhea.  Tried taking over-the-counter medication without relief.  No reported shortness of breath.  On exam, patient is sitting in her bed, on her phone, appears to be in no acute  discomfort.  She does have occasional cough.  Head exam unremarkable, no nuchal rigidity, lungs otherwise clear to auscultation bilaterally, heart with normal rate and rhythm abdomen soft and nontender, no significant peripheral edema noted.  Patient has been seen in the ED multiple times for various complaints last visit was yesterday.  Cough seems to be a lingering problem and patient was previously diagnosed with RSV on 1/16.  Vitals are reviewed and overall reassuring, no fever, no hypoxia.  Patient is not wheezing on exam, chest x-ray ordered to rule out pneumonia.  Currently have low suspicion for PE.  Doubt drug-induced cough.  Provide patient with supportive care including Tylenol for headache, as well as Tessalon cough medication.  6:38 AM Pt sign out to oncoming provider who will f/u on CXR. Anticipate discharge pending xray result.  Suspect residual cough from recent RSV.        Final Clinical Impression(s) / ED Diagnoses Final diagnoses:  Acute cough    Rx / DC Orders ED Discharge Orders     None         Domenic Moras, PA-C 10/01/22 8921    Palumbo, April, MD 10/01/22 1941

## 2022-10-01 NOTE — ED Provider Notes (Signed)
I received this patient handoff from previous PA.  See his note for original history and workup thus far.  In short patient is a 47 year old female who presented today with complaint of a cough.  Recently tested positive for RSV.  Also is unhoused which contributes to several emergency department visits.  X-ray pending at this time.  Plan is for me to follow-up on  Physical Exam  BP (!) 147/108 (BP Location: Right Arm)   Pulse 100   Temp 98.6 F (37 C)   Resp (!) 22   Ht '5\' 7"'$  (1.702 m)   Wt 90.7 kg   LMP 09/07/2022   SpO2 98%   BMI 31.32 kg/m   Physical Exam  Procedures  Procedures  ED Course / MDM    Medical Decision Making Amount and/or Complexity of Data Reviewed Radiology: ordered.  Risk OTC drugs. Prescription drug management.   History negative.  Will be discharged       Valerie Howard 10/01/22 1224    Palumbo, April, MD 10/01/22 2342

## 2022-10-01 NOTE — ED Triage Notes (Signed)
Patient has been here several times for multiple complaints and has been sleeping in the lobby all night.  Patient reports Thursday started with cough congestion.  Patient dx with RSV a few weeks ago. Patient is homeless.  Patient eating in triage requesting something to drink.

## 2022-10-01 NOTE — Discharge Instructions (Addendum)
Your cough is likely due to recent RSV infection.  Take medications prescribed.  Follow up with your doctor for further care.

## 2022-10-04 ENCOUNTER — Other Ambulatory Visit (HOSPITAL_COMMUNITY): Payer: Self-pay

## 2022-10-04 ENCOUNTER — Other Ambulatory Visit: Payer: Self-pay

## 2022-10-04 ENCOUNTER — Emergency Department (HOSPITAL_COMMUNITY)
Admission: EM | Admit: 2022-10-04 | Discharge: 2022-10-04 | Disposition: A | Discharge status: Home / Self Care | Payer: Medicaid Other | Attending: Emergency Medicine | Admitting: Emergency Medicine

## 2022-10-04 ENCOUNTER — Encounter (HOSPITAL_COMMUNITY): Payer: Self-pay

## 2022-10-04 DIAGNOSIS — Z20822 Contact with and (suspected) exposure to covid-19: Secondary | ICD-10-CM | POA: Diagnosis not present

## 2022-10-04 DIAGNOSIS — Z5900 Homelessness unspecified: Secondary | ICD-10-CM | POA: Diagnosis not present

## 2022-10-04 DIAGNOSIS — N939 Abnormal uterine and vaginal bleeding, unspecified: Secondary | ICD-10-CM | POA: Insufficient documentation

## 2022-10-04 LAB — CBC WITH DIFFERENTIAL/PLATELET
Abs Immature Granulocytes: 0.02 10*3/uL (ref 0.00–0.07)
Basophils Absolute: 0 10*3/uL (ref 0.0–0.1)
Basophils Relative: 1 %
Eosinophils Absolute: 0 10*3/uL (ref 0.0–0.5)
Eosinophils Relative: 1 %
HCT: 39.5 % (ref 36.0–46.0)
Hemoglobin: 12 g/dL (ref 12.0–15.0)
Immature Granulocytes: 0 %
Lymphocytes Relative: 29 %
Lymphs Abs: 1.9 10*3/uL (ref 0.7–4.0)
MCH: 25.2 pg — ABNORMAL LOW (ref 26.0–34.0)
MCHC: 30.4 g/dL (ref 30.0–36.0)
MCV: 82.8 fL (ref 80.0–100.0)
Monocytes Absolute: 0.3 10*3/uL (ref 0.1–1.0)
Monocytes Relative: 4 %
Neutro Abs: 4.1 10*3/uL (ref 1.7–7.7)
Neutrophils Relative %: 65 %
Platelets: 320 10*3/uL (ref 150–400)
RBC: 4.77 MIL/uL (ref 3.87–5.11)
RDW: 26.8 % — ABNORMAL HIGH (ref 11.5–15.5)
WBC: 6.4 10*3/uL (ref 4.0–10.5)
nRBC: 0 % (ref 0.0–0.2)

## 2022-10-04 LAB — BASIC METABOLIC PANEL
Anion gap: 10 (ref 5–15)
BUN: 8 mg/dL (ref 6–20)
CO2: 25 mmol/L (ref 22–32)
Calcium: 9.4 mg/dL (ref 8.9–10.3)
Chloride: 103 mmol/L (ref 98–111)
Creatinine, Ser: 0.84 mg/dL (ref 0.44–1.00)
GFR, Estimated: >60 mL/min (ref >60)
Glucose, Bld: 87 mg/dL (ref 70–99)
Potassium: 4.1 mmol/L (ref 3.5–5.1)
Sodium: 138 mmol/L (ref 135–145)

## 2022-10-04 LAB — RESP PANEL BY RT-PCR (RSV, FLU A&B, COVID)  RVPGX2
Influenza A by PCR: NEGATIVE
Influenza B by PCR: NEGATIVE
Resp Syncytial Virus by PCR: NEGATIVE
SARS Coronavirus 2 by RT PCR: NEGATIVE

## 2022-10-04 LAB — I-STAT BETA HCG BLOOD, ED (MC, WL, AP ONLY): I-stat hCG, quantitative: <5 m[IU]/mL (ref <5)

## 2022-10-04 MED ORDER — MEGESTROL ACETATE 40 MG PO TABS
40.0000 mg | ORAL_TABLET | Freq: Every day | ORAL | 2 refills | Status: DC
  Filled 2022-10-04: qty 45, 45d supply, fill #0

## 2022-10-04 MED ORDER — MEGESTROL ACETATE 40 MG PO TABS
40.0000 mg | ORAL_TABLET | Freq: Every day | ORAL | Status: DC
  Administered 2022-10-04: 40 mg via ORAL
  Filled 2022-10-04: qty 1

## 2022-10-04 MED ORDER — MEGESTROL ACETATE 40 MG PO TABS: 40.0000 mg | ORAL_TABLET | Freq: Every day | ORAL | 2 refills | Status: DC

## 2022-10-04 NOTE — Discharge Planning (Signed)
RNCM consulted regarding pt requiring discharge Rx.  RNCM confirmed availability of Rx with [facility] and they will deliver Rx to patient at bedside prior to discharge from hospital.

## 2022-10-04 NOTE — ED Provider Triage Note (Signed)
Emergency Medicine Provider Triage Evaluation Note  Valerie Howard , a 47 y.o. female  was evaluated in triage.  Pt complains of vaginal bleeding which has been going on for a very long time.  Patient been seen in the ED multiple times and given Megace and OB follow-up but has not followed up yet.  Abdominal pain, not on blood thinners..  Review of Systems  Per HPI  Physical Exam  BP (!) 163/104 (BP Location: Right Arm)   Pulse 89   Temp 98 F (36.7 C)   Resp 19   Ht '5\' 7"'$  (6.073 m)   Wt 95.3 kg   LMP 09/07/2022   SpO2 100%   BMI 32.89 kg/m  Gen:   Awake, no distress   Resp:  Normal effort  MSK:   Moves extremities without difficulty  Other:  Abdomen soft  Medical Decision Making  Medically screening exam initiated at 9:08 AM.  Appropriate orders placed.  Aleiah L Hillmer was informed that the remainder of the evaluation will be completed by another provider, this initial triage assessment does not replace that evaluation, and the importance of remaining in the ED until their evaluation is complete.     [name], [place] 10/04/22 [phone]

## 2022-10-04 NOTE — Discharge Instructions (Signed)
You are seen today in the emergency department due to vaginal bleeding.  Take the Megace 20 mg 3 times daily for 5 days, take 2 times daily for 5 days and then 1 time daily until bleeding stops or until you can see your OB gynecologist.  Return to the ED for new or concerning symptoms.

## 2022-10-04 NOTE — ED Triage Notes (Signed)
Pt arrived POV from home c/o vaginal bleeding that has been ongoing since Nov. That has been off and on. Pt was just here a couple days ago for the same and was told to take a medication and to see a OBGYN but she said it is not better.

## 2022-10-04 NOTE — ED Provider Notes (Signed)
[facility] Provider Note   CSN: [id number] Arrival date & time: 10/04/22  0840     History  Chief Complaint  Patient presents with   Vaginal Bleeding    Valerie Howard is a 47 y.o. female.   Vaginal Bleeding   This is a 47 year old female with medical history of schizoaffective disorder, fibroids, uterine bleeding, homelessness presents to the emergency department due to vaginal bleeding.  This been an ongoing issue since November of last year, she was seen in the emergency department multiple times for this.  Improves when she is on the Megace, she intermittently will stop taking it due to concerns about "tampering".  States that it improved a lot in January but she has been having bleeding again.  She is having any abdominal pain, not on any blood thinners.  History of tubal ligation and C-section no other abdominal surgeries.  She has not followed up with [facility] gynecology yet due to financial restraints.  Home Medications Prior to Admission medications   Medication Sig Start Date End Date Taking? Authorizing Provider  acetaminophen (TYLENOL) 500 MG tablet Take 1 tablet (500 mg total) by mouth every 6 (six) hours as needed. 10/01/22   Domenic Moras, PA-C  benzonatate (TESSALON) 100 MG capsule Take 1 capsule (100 mg total) by mouth 3 (three) times daily as needed for cough. 10/01/22   Domenic Moras, PA-C  megestrol (MEGACE) 40 MG tablet Take 1 tablet (40 mg total) by mouth daily. 10/04/22 02/16/23  [name], PA-C      Allergies    Penicillins    Review of Systems   Review of Systems  Genitourinary:  Positive for vaginal bleeding.    Physical Exam Updated Vital Signs BP (!) 159/90   Pulse 90   Temp 98.8 F (37.1 C) (Oral)   Resp 18   Ht 5' 7"$  (1.702 m)   Wt 95.3 kg   LMP 09/07/2022   SpO2 98%   BMI 32.89 kg/m  Physical Exam Vitals and nursing note reviewed. Exam conducted with a chaperone present.  Constitutional:       Appearance: Normal appearance.  HENT:     Head: Normocephalic and atraumatic.  Eyes:     General: No scleral icterus.       Right eye: No discharge.        Left eye: No discharge.     Extraocular Movements: Extraocular movements intact.     Pupils: Pupils are equal, round, and reactive to light.  Cardiovascular:     Rate and Rhythm: Normal rate and regular rhythm.     Pulses: Normal pulses.     Heart sounds: Normal heart sounds. No murmur heard.    No friction rub. No gallop.  Pulmonary:     Effort: Pulmonary effort is normal. No respiratory distress.     Breath sounds: Normal breath sounds.  Abdominal:     General: Abdomen is flat. Bowel sounds are normal. There is no distension.     Palpations: Abdomen is soft.     Tenderness: There is no abdominal tenderness.     Comments: Abdomen is soft and nontender  Skin:    General: Skin is warm and dry.     Coloration: Skin is not jaundiced.  Neurological:     Mental Status: She is alert. Mental status is at baseline.     Coordination: Coordination normal.     ED Results / Procedures / Treatments   Labs (all  labs ordered are listed, but only abnormal results are displayed) Labs Reviewed  CBC WITH DIFFERENTIAL/PLATELET - Abnormal; Notable for the following components:      Result Value   MCH 25.2 (*)    RDW 26.8 (*)    All other components within normal limits  RESP PANEL BY RT-PCR (RSV, FLU A&B, COVID)  RVPGX2  BASIC METABOLIC PANEL  I-STAT BETA HCG BLOOD, ED ([facility], [facility], [facility] ONLY)    EKG None  Radiology No results found.  Procedures Procedures    Medications Ordered in ED Medications  megestrol (MEGACE) tablet 40 mg (40 mg Oral Given 10/04/22 1134)    ED Course/ Medical Decision Making/ A&P                             Medical Decision Making Amount and/or Complexity of Data Reviewed Labs: ordered.  Risk Prescription drug management.   This is a 47 year old female presenting to the emergency department due to  vaginal bleeding.  Differential includes not limited to symptomatic anemia, hemorrhage, fibroids.  I viewed external medical records, patient has been seen and assessed in the emergency department for abnormal uterine bleeding.  She has not followed up with her OB/GYN.  I did review her home medication list, she has been off Megace for some time after the bleeding returned it appears.  I consider getting a pelvic exam but patient deferred stating "they always do it and it is normal".  Given she has had significant workup for this in the past but ultrasounds I do not really feel strongly pelvic exam change management.  Additionally she is not having any pain and history of tube ligation so very low suspicion that she could have an ectopic pregnancy.  Will start with labs and then reevaluate.  Patient is not pregnant, no anemia on CBC.  CMP without any gross electrolyte derangement or AKI.  I consulted our social work department was able to get the patient's Megace covered due to financial restraints.  Patient is stable for outpatient follow-up with gynecology and primary.  All questions asked and answered, discharged stable condition.        Final Clinical Impression(s) / ED Diagnoses Final diagnoses:  Vaginal bleeding    Rx / DC Orders ED Discharge Orders          Ordered    megestrol (MEGACE) 40 MG tablet  Daily,   Status:  Discontinued        10/04/22 1100    megestrol (MEGACE) 40 MG tablet  Daily        10/04/22 1211              [name], PA-C 10/04/22 1549    Margette Fast, MD 10/07/22 [phone]

## 2022-10-06 ENCOUNTER — Telehealth (HOSPITAL_COMMUNITY): Payer: Self-pay | Admitting: *Deleted

## 2022-10-06 NOTE — Telephone Encounter (Signed)
error 

## 2022-10-15 ENCOUNTER — Emergency Department (HOSPITAL_COMMUNITY)
Admission: EM | Admit: 2022-10-15 | Discharge: 2022-10-15 | Disposition: A | Discharge status: Home / Self Care | Payer: Medicaid Other | Attending: Emergency Medicine | Admitting: Emergency Medicine

## 2022-10-15 ENCOUNTER — Encounter (HOSPITAL_COMMUNITY): Payer: Self-pay

## 2022-10-15 ENCOUNTER — Other Ambulatory Visit: Payer: Self-pay

## 2022-10-15 DIAGNOSIS — N939 Abnormal uterine and vaginal bleeding, unspecified: Secondary | ICD-10-CM | POA: Insufficient documentation

## 2022-10-15 LAB — CBC WITH DIFFERENTIAL/PLATELET
Abs Immature Granulocytes: 0.01 10*3/uL (ref 0.00–0.07)
Basophils Absolute: 0 10*3/uL (ref 0.0–0.1)
Basophils Relative: 0 %
Eosinophils Absolute: 0 10*3/uL (ref 0.0–0.5)
Eosinophils Relative: 0 %
HCT: 40.6 % (ref 36.0–46.0)
Hemoglobin: 12.2 g/dL (ref 12.0–15.0)
Immature Granulocytes: 0 %
Lymphocytes Relative: 24 %
Lymphs Abs: 1.6 10*3/uL (ref 0.7–4.0)
MCH: 25.3 pg — ABNORMAL LOW (ref 26.0–34.0)
MCHC: 30 g/dL (ref 30.0–36.0)
MCV: 84.1 fL (ref 80.0–100.0)
Monocytes Absolute: 0.3 10*3/uL (ref 0.1–1.0)
Monocytes Relative: 4 %
Neutro Abs: 4.8 10*3/uL (ref 1.7–7.7)
Neutrophils Relative %: 72 %
Platelets: 425 10*3/uL — ABNORMAL HIGH (ref 150–400)
RBC: 4.83 MIL/uL (ref 3.87–5.11)
RDW: 25.2 % — ABNORMAL HIGH (ref 11.5–15.5)
WBC: 6.7 10*3/uL (ref 4.0–10.5)
nRBC: 0 % (ref 0.0–0.2)

## 2022-10-15 LAB — COMPREHENSIVE METABOLIC PANEL
ALT: 11 U/L (ref 0–44)
AST: 17 U/L (ref 15–41)
Albumin: 4.2 g/dL (ref 3.5–5.0)
Alkaline Phosphatase: 60 U/L (ref 38–126)
Anion gap: 9 (ref 5–15)
BUN: 12 mg/dL (ref 6–20)
CO2: 24 mmol/L (ref 22–32)
Calcium: 9.4 mg/dL (ref 8.9–10.3)
Chloride: 105 mmol/L (ref 98–111)
Creatinine, Ser: 0.92 mg/dL (ref 0.44–1.00)
GFR, Estimated: >60 mL/min (ref >60)
Glucose, Bld: 87 mg/dL (ref 70–99)
Potassium: 3.7 mmol/L (ref 3.5–5.1)
Sodium: 138 mmol/L (ref 135–145)
Total Bilirubin: 0.8 mg/dL (ref 0.3–1.2)
Total Protein: 8.8 g/dL — ABNORMAL HIGH (ref 6.5–8.1)

## 2022-10-15 LAB — I-STAT BETA HCG BLOOD, ED (MC, WL, AP ONLY): I-stat hCG, quantitative: <5 m[IU]/mL (ref <5)

## 2022-10-15 LAB — TYPE AND SCREEN
ABO/RH(D): O POS
Antibody Screen: NEGATIVE

## 2022-10-15 NOTE — ED Triage Notes (Signed)
Pt coming from across the [address] via EMS with c/o ongoing vaginal bleeding for a couple months. Hx of same, seen recently for same.

## 2022-10-15 NOTE — ED Provider Notes (Signed)
[facility] Provider Note   CSN: [id number] Arrival date & time: 10/15/22  1022     History  Chief Complaint  Patient presents with   Vaginal Bleeding    Valerie Howard is a 47 y.o. female with a past medical history of schizophrenia, fibroids, who presents emergency department brought in by EMS with concerns for ongoing vaginal bleeding for several months.  Patient has a history of same has been evaluated previously for the same.  She notes that she has been taking the Megace as prescribed and stopped taking it 4 days ago.  Notes that she is still having bleeding.  When asked to describe how much bleeding she has had patient is unable to clarify at this time.  Patient notes palpitations.  No abdominal pain, nausea, vomiting, dizziness, lightheadedness.  The history is provided by the patient. No language interpreter was used.       Home Medications Prior to Admission medications   Medication Sig Start Date End Date Taking? Authorizing Provider  acetaminophen (TYLENOL) 500 MG tablet Take 1 tablet (500 mg total) by mouth every 6 (six) hours as needed. 10/01/22   Domenic Moras, PA-C  benzonatate (TESSALON) 100 MG capsule Take 1 capsule (100 mg total) by mouth 3 (three) times daily as needed for cough. 10/01/22   Domenic Moras, PA-C  megestrol (MEGACE) 40 MG tablet Take 1 tablet (40 mg total) by mouth daily. 10/04/22 02/16/23  [name], PA-C      Allergies    Penicillins    Review of Systems   Review of Systems  Genitourinary:  Positive for vaginal bleeding.  All other systems reviewed and are negative.   Physical Exam Updated Vital Signs BP (!) 142/72 (BP Location: Right Arm)   Pulse (!) 130   Temp 98.7 F (37.1 C) (Oral)   SpO2 98%  Physical Exam Vitals and nursing note reviewed.  Constitutional:      General: She is not in acute distress.    Appearance: She is not diaphoretic.  HENT:     Head: Normocephalic and atraumatic.      Mouth/Throat:     Pharynx: No oropharyngeal exudate.  Eyes:     General: No scleral icterus.    Conjunctiva/sclera: Conjunctivae normal.  Cardiovascular:     Rate and Rhythm: Regular rhythm. Tachycardia present.     Pulses: Normal pulses.     Heart sounds: Normal heart sounds.  Pulmonary:     Effort: Pulmonary effort is normal. No respiratory distress.     Breath sounds: Normal breath sounds. No wheezing.  Abdominal:     General: Bowel sounds are normal.     Palpations: Abdomen is soft. There is no mass.     Tenderness: There is no abdominal tenderness. There is no guarding or rebound.  Musculoskeletal:        General: Normal range of motion.     Cervical back: Normal range of motion and neck supple.  Skin:    General: Skin is warm and dry.  Neurological:     Mental Status: She is alert.  Psychiatric:        Behavior: Behavior normal.     ED Results / Procedures / Treatments   Labs (all labs ordered are listed, but only abnormal results are displayed) Labs Reviewed  CBC WITH DIFFERENTIAL/PLATELET - Abnormal; Notable for the following components:      Result Value   MCH 25.3 (*)    RDW  25.2 (*)    Platelets 425 (*)    All other components within normal limits  COMPREHENSIVE METABOLIC PANEL - Abnormal; Notable for the following components:   Total Protein 8.8 (*)    All other components within normal limits  URINALYSIS, ROUTINE W REFLEX MICROSCOPIC  I-STAT BETA HCG BLOOD, ED ([facility], [facility], [facility] ONLY)  TYPE AND SCREEN    EKG None  Radiology No results found.  Procedures Procedures    Medications Ordered in ED Medications - No data to display  ED Course/ Medical Decision Making/ A&P Clinical Course as of 10/15/22 1410  Sat Oct 15, 2022  1210 Re-evaluated and at this time patient declines pelvic exam and ultrasound at this time.  Discussed with patient regarding concern due to patient's persistent vaginal bleeding and that we can proceed with a ultrasound pelvic  exam.  Patient again declines. [SB]  1406 Patient reevaluated and discussed with patient lab findings.  Patient notes that she has an area to her genital region that is painful and itches.  Conversation held with patient that we could not visualize the area without need for pelvic exam to assess if the area needs treatment.  Patient declines at this time.  We discussed with patient that she would be discharged.  Patient notes "I do not know what is going on with me the symptoms going on with me.".  Again discussed with patient that we are not able to assess if anything is going on today if she will not allow [place] to do a pelvic exam or ultrasound for her vaginal bleeding.  Patient notes "I do not know what the obsession is with my private parts.".  Discussed with patient that it is recommended that she follows with OB/GYN specialist regarding her multiple ED visits.  Patient resting, estrogen appears safe for discharge at this time. [SB]    Clinical Course User Index [SB] Lyberti Thrush A, PA-C                             Medical Decision Making Amount and/or Complexity of Data Reviewed Labs: ordered.   Pt presents with concerns for vaginal bleeding for several months.  Has been evaluated multiple times for similar concerns.  Patient afebrile, not tachycardic or hypoxic.  On exam patient without acute cardiovascular, respiratory, down exam findings.  Differential diagnosis includes AUB, menorrhagia.  Co morbidities that complicate the patient evaluation: Schizophrenia, fibroids  Additional history obtained:  External records from outside source obtained and reviewed including: Patient has been evaluated multiple times for similar concerns of vaginal bleeding.  Most recent visits on 2/6 and 2/3.  Labs:  I ordered, and personally interpreted labs.  The pertinent results include:   Negative hCG CBC with hemoglobin uptrending from previous value at 12.2. CMP  unremarkable  Disposition: Presentation suspicious for vaginal bleeding.  Unable at this time to fully assess patient's concerns for vaginal bleeding as patient is declining pelvic exam as well as ultrasound emergency department.  Patient overall stable and well-appearing throughout her time in the emergency department.  After consideration of the diagnostic results and the patients response to treatment, I feel that the patient would benefit from Discharge home.  Instructed patient to follow-up with OB/GYN specialist.  Supportive care measures and strict return precautions discussed with patient at bedside. Pt acknowledges and verbalizes understanding. Pt appears safe for discharge. Follow up as indicated in discharge paperwork.    This chart was  dictated using voice recognition software, Dragon. Despite the best efforts of this provider to proofread and correct errors, errors may still occur which can change documentation meaning.   Final Clinical Impression(s) / ED Diagnoses Final diagnoses:  Vaginal bleeding    Rx / DC Orders ED Discharge Orders     None         Cleofas Hudgins A, PA-C 10/15/22 1451    Charlesetta Shanks, MD 10/20/22 1713

## 2022-10-15 NOTE — Discharge Instructions (Signed)
Your labs were unremarkable without acute emergent findings here today.  It is important that you follow-up with an OB/GYN specialist regarding today's ED visit.  Follow-up with your primary care provider as needed.  Return to the emergency department if you experience increasing/worsening symptoms.

## 2022-10-15 NOTE — ED Notes (Signed)
#  20 IV removed from left wrist, pt provided with bus pass.

## 2022-10-19 ENCOUNTER — Emergency Department (HOSPITAL_COMMUNITY)
Admission: EM | Admit: 2022-10-19 | Discharge: 2022-10-19 | Disposition: A | Discharge status: Home / Self Care | Payer: Medicaid Other | Attending: Emergency Medicine | Admitting: Emergency Medicine

## 2022-10-19 ENCOUNTER — Encounter (HOSPITAL_COMMUNITY): Payer: Self-pay | Admitting: Emergency Medicine

## 2022-10-19 ENCOUNTER — Other Ambulatory Visit: Payer: Self-pay

## 2022-10-19 DIAGNOSIS — N939 Abnormal uterine and vaginal bleeding, unspecified: Secondary | ICD-10-CM | POA: Diagnosis not present

## 2022-10-19 DIAGNOSIS — D259 Leiomyoma of uterus, unspecified: Secondary | ICD-10-CM

## 2022-10-19 LAB — CBC WITH DIFFERENTIAL/PLATELET
Abs Immature Granulocytes: 0 10*3/uL (ref 0.00–0.07)
Basophils Absolute: 0 10*3/uL (ref 0.0–0.1)
Basophils Relative: 1 %
Eosinophils Absolute: 0 10*3/uL (ref 0.0–0.5)
Eosinophils Relative: 0 %
HCT: 39.8 % (ref 36.0–46.0)
Hemoglobin: 11.9 g/dL — ABNORMAL LOW (ref 12.0–15.0)
Immature Granulocytes: 0 %
Lymphocytes Relative: 32 %
Lymphs Abs: 1.7 10*3/uL (ref 0.7–4.0)
MCH: 25.4 pg — ABNORMAL LOW (ref 26.0–34.0)
MCHC: 29.9 g/dL — ABNORMAL LOW (ref 30.0–36.0)
MCV: 85 fL (ref 80.0–100.0)
Monocytes Absolute: 0.3 10*3/uL (ref 0.1–1.0)
Monocytes Relative: 5 %
Neutro Abs: 3.3 10*3/uL (ref 1.7–7.7)
Neutrophils Relative %: 62 %
Platelets: 370 10*3/uL (ref 150–400)
RBC: 4.68 MIL/uL (ref 3.87–5.11)
RDW: 24 % — ABNORMAL HIGH (ref 11.5–15.5)
WBC: 5.3 10*3/uL (ref 4.0–10.5)
nRBC: 0 % (ref 0.0–0.2)

## 2022-10-19 LAB — BASIC METABOLIC PANEL
Anion gap: 10 (ref 5–15)
BUN: 10 mg/dL (ref 6–20)
CO2: 24 mmol/L (ref 22–32)
Calcium: 9.4 mg/dL (ref 8.9–10.3)
Chloride: 104 mmol/L (ref 98–111)
Creatinine, Ser: 0.98 mg/dL (ref 0.44–1.00)
GFR, Estimated: >60 mL/min (ref >60)
Glucose, Bld: 79 mg/dL (ref 70–99)
Potassium: 3.8 mmol/L (ref 3.5–5.1)
Sodium: 138 mmol/L (ref 135–145)

## 2022-10-19 LAB — PROTIME-INR
INR: 1.1 (ref 0.8–1.2)
Prothrombin Time: 13.9 seconds (ref 11.4–15.2)

## 2022-10-19 LAB — TYPE AND SCREEN
ABO/RH(D): O POS
Antibody Screen: NEGATIVE

## 2022-10-19 LAB — HCG, QUANTITATIVE, PREGNANCY: hCG, Beta Chain, Quant, S: <1 m[IU]/mL (ref <5)

## 2022-10-19 MED ORDER — MEGESTROL ACETATE 40 MG PO TABS: 40.0000 mg | ORAL_TABLET | Freq: Every day | ORAL | 0 refills | Status: DC

## 2022-10-19 MED ORDER — MEGESTROL ACETATE 40 MG PO TABS
40.0000 mg | ORAL_TABLET | Freq: Every day | ORAL | Status: DC
  Administered 2022-10-19: 40 mg via ORAL
  Filled 2022-10-19 (×2): qty 1

## 2022-10-19 MED ORDER — IBUPROFEN 200 MG PO TABS
600.0000 mg | ORAL_TABLET | Freq: Once | ORAL | Status: AC
  Administered 2022-10-19: 600 mg via ORAL
  Filled 2022-10-19 (×2): qty 3

## 2022-10-19 NOTE — Discharge Instructions (Addendum)
Follow-up with OB/GYN regarding your vaginal bleeding.  You can take Tylenol and ibuprofen as needed for cramps.  Take your medications as prescribed and do not miss doses.  Make an appointment with OB/GYN listed in your discharge paperwork.  Come back if only severe worsening uncontrolled bleeding, fainting, severe uncontrolled pain, or any other symptoms concerning to you.
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
## 2022-10-19 NOTE — ED Triage Notes (Signed)
Per [facility] pt coming from side of road c/o vaginal bleeding onset of yesterday. Patient states she was able to push the clots out.

## 2022-10-19 NOTE — ED Notes (Signed)
Went in to give pt meds and meds were

## 2022-10-19 NOTE — ED Notes (Signed)
Pt refused to change into hospital gown. RN aware.
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
## 2022-10-19 NOTE — ED Notes (Signed)
Meds were pulled for pt and scanned. Meds were put into cup for patient to take. Pt at this time refused to take medication because she wanted to see the meds in the original packet after already being shown the medication in the packet. Called pharmacy and they resent the same medication. Pt was shown the medication and removed them from the packet herself. Pt took medication at this time and was offered water but refused the water. Pt took medication without any struggle.

## 2022-10-29 ENCOUNTER — Other Ambulatory Visit: Payer: Self-pay

## 2022-10-29 ENCOUNTER — Encounter (HOSPITAL_COMMUNITY): Payer: Self-pay | Admitting: *Deleted

## 2022-10-29 ENCOUNTER — Emergency Department (HOSPITAL_COMMUNITY)
Admission: EM | Admit: 2022-10-29 | Discharge: 2022-10-29 | Disposition: A | Discharge status: Home / Self Care | Payer: Medicaid Other | Attending: Emergency Medicine | Admitting: Emergency Medicine

## 2022-10-29 DIAGNOSIS — Z5900 Homelessness unspecified: Secondary | ICD-10-CM | POA: Insufficient documentation

## 2022-10-29 DIAGNOSIS — F419 Anxiety disorder, unspecified: Secondary | ICD-10-CM | POA: Insufficient documentation

## 2022-10-29 DIAGNOSIS — M79671 Pain in right foot: Secondary | ICD-10-CM | POA: Insufficient documentation

## 2022-10-29 DIAGNOSIS — M79672 Pain in left foot: Secondary | ICD-10-CM | POA: Diagnosis present

## 2022-10-29 NOTE — ED Notes (Signed)
C/o BLE pain. Denies trauma. No bruising, swelling, deformity noted. Distal CMS intact.

## 2022-10-29 NOTE — ED Triage Notes (Signed)
C/op bilateral foot  pain onset 2 weeks ago, states she is homeless and walks a lot. No breakdown on her feet.

## 2022-10-29 NOTE — ED Provider Notes (Signed)
[facility] Provider Note   CSN: [id number] Arrival date & time: 10/29/22  [id number]     History  No chief complaint on file.   Valerie Howard is a 47 y.o. female.  HPI Patient presents with concern for bilateral foot pain.  Pain has been present for at least 2 weeks, has been discussed previously, today she notes that temperature is warmer and she was able to come for evaluation.  Pain is focally about the dorsal surface, both feet distally, no loss of sensation.  She continues to ambulate.  She notes that she is homeless, and has lost much of her footwear recently.  She is currently wearing crocs. No other complaints, and she refers to prior evaluations for issues, but notes that these are not currently a concern.  (Chart review notable for multiple presentations for vaginal bleeding.)     Home Medications Prior to Admission medications   Medication Sig Start Date End Date Taking? Authorizing Provider  acetaminophen (TYLENOL) 500 MG tablet Take 1 tablet (500 mg total) by mouth every 6 (six) hours as needed. 10/01/22   Domenic Moras, PA-C  benzonatate (TESSALON) 100 MG capsule Take 1 capsule (100 mg total) by mouth 3 (three) times daily as needed for cough. 10/01/22   Domenic Moras, PA-C  megestrol (MEGACE) 40 MG tablet Take 1 tablet (40 mg total) by mouth daily. 10/19/22 01/17/23  Elgie Congo, MD      Allergies    Penicillins    Review of Systems   Review of Systems  All other systems reviewed and are negative.   Physical Exam Updated Vital Signs LMP 10/19/2022  Physical Exam Vitals and nursing note reviewed.  Constitutional:      General: She is not in acute distress.    Appearance: She is well-developed.  HENT:     Head: Normocephalic and atraumatic.  Eyes:     Conjunctiva/sclera: Conjunctivae normal.  Cardiovascular:     Rate and Rhythm: Normal rate and regular rhythm.     Pulses: Normal pulses.  Pulmonary:     Effort:  Pulmonary effort is normal. No respiratory distress.     Breath sounds: No stridor.  Abdominal:     General: There is no distension.  Musculoskeletal:     Comments: Both feet examined range of motion normal, plantar and dorsiflexion normal.  Pulses symmetric equal PT bilateral, cap refill normal, no notable abnormalities aside from dry skin.  Skin:    General: Skin is warm and dry.  Neurological:     General: No focal deficit present.     Mental Status: She is alert and oriented to person, place, and time.     Cranial Nerves: No cranial nerve deficit.  Psychiatric:        Mood and Affect: Mood is anxious.     ED Results / Procedures / Treatments   Labs (all labs ordered are listed, but only abnormal results are displayed) Labs Reviewed - No data to display  EKG None  Radiology No results found.  Procedures Procedures    Medications Ordered in ED Medications - No data to display  ED Course/ Medical Decision Making/ A&P                             Medical Decision Making Patient with 13 ED evals in the past 6 months presents with new concern for bilateral foot pain.  She  is awake, alert, afebrile, speaking clearly, ambulatory, has no evidence for neurovascular compromise in either foot, no appreciable swelling, no dysfunction, some suspicion for homelessness, lack of footwear contributing to her symptoms.  Patient encouraged to manage this is much as possible, can follow-up with orthopedics otherwise we will start anti-inflammatories.  Amount and/or Complexity of Data Reviewed External Data Reviewed: notes.    Details: Multiple recent ED eval typically for vaginal bleeding, she notes no concerns today.  Risk OTC drugs. Decision regarding hospitalization.  Final Clinical Impression(s) / ED Diagnoses Final diagnoses:  Pain in both feet    Rx / DC Orders ED Discharge Orders     None         Carmin Muskrat, MD 10/29/22 [phone]

## 2022-10-29 NOTE — Discharge Instructions (Signed)
As discussed, today's evaluation has been reassuring.  Your pain is likely due to combination of factors, including overuse, and limited choices of footwear.  There is currently no evidence for infection, or nervous system disruption.  Please use ibuprofen, 400 mg, 3 times daily taken with food for relief.  If needed, please follow-up with our orthopedic colleagues.

## 2022-11-01 ENCOUNTER — Emergency Department (HOSPITAL_COMMUNITY)
Admission: EM | Admit: 2022-11-01 | Discharge: 2022-11-01 | Disposition: A | Discharge status: Home / Self Care | Payer: Medicaid Other | Attending: Emergency Medicine | Admitting: Emergency Medicine

## 2022-11-01 DIAGNOSIS — Z5900 Homelessness unspecified: Secondary | ICD-10-CM | POA: Insufficient documentation

## 2022-11-01 DIAGNOSIS — S93602A Unspecified sprain of left foot, initial encounter: Secondary | ICD-10-CM | POA: Insufficient documentation

## 2022-11-01 DIAGNOSIS — S99921A Unspecified injury of right foot, initial encounter: Secondary | ICD-10-CM | POA: Diagnosis present

## 2022-11-01 DIAGNOSIS — S93609A Unspecified sprain of unspecified foot, initial encounter: Secondary | ICD-10-CM

## 2022-11-01 DIAGNOSIS — X58XXXA Exposure to other specified factors, initial encounter: Secondary | ICD-10-CM | POA: Insufficient documentation

## 2022-11-01 DIAGNOSIS — S93601A Unspecified sprain of right foot, initial encounter: Secondary | ICD-10-CM | POA: Insufficient documentation

## 2022-11-01 NOTE — ED Triage Notes (Signed)
Pt. Stated, I've had bilateral foot pain since the middle of February. Donnald Garre been here a few times for the same thing, but they are still hurting
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
## 2022-11-01 NOTE — ED Notes (Signed)
Pt upset about being discharged. Security called to bedside
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
## 2022-11-15 ENCOUNTER — Encounter (HOSPITAL_COMMUNITY): Payer: Self-pay

## 2022-11-15 ENCOUNTER — Other Ambulatory Visit: Payer: Self-pay

## 2022-11-15 ENCOUNTER — Emergency Department (HOSPITAL_COMMUNITY)
Admission: EM | Admit: 2022-11-15 | Discharge: 2022-11-15 | Disposition: A | Discharge status: Home / Self Care | Payer: Medicaid Other | Attending: Emergency Medicine | Admitting: Emergency Medicine

## 2022-11-15 DIAGNOSIS — Z5900 Homelessness unspecified: Secondary | ICD-10-CM | POA: Insufficient documentation

## 2022-11-15 DIAGNOSIS — R2243 Localized swelling, mass and lump, lower limb, bilateral: Secondary | ICD-10-CM | POA: Diagnosis present

## 2022-11-15 DIAGNOSIS — M79604 Pain in right leg: Secondary | ICD-10-CM

## 2022-11-15 DIAGNOSIS — R600 Localized edema: Secondary | ICD-10-CM | POA: Insufficient documentation

## 2022-11-15 DIAGNOSIS — R609 Edema, unspecified: Secondary | ICD-10-CM

## 2022-11-15 NOTE — ED Notes (Signed)
Pt given a bus pass, pt able to ambulate out to [facility] lobby w/o assistance, pt has her personal belongings with her.

## 2022-11-15 NOTE — ED Notes (Signed)
PA-C Montine Circle at the bedside to assess pt's bilateral lower extremities

## 2022-11-15 NOTE — ED Notes (Signed)
Pt appears to be resting, even RR noted, and symmetrical, NAD noted, pt homeless, sleeping in different places, pt has her belongings in a duffle bag.

## 2022-11-15 NOTE — ED Provider Notes (Signed)
[facility] Emergency Department Provider Note MRN:  [id number]  Arrival date & time: 11/15/22     Chief Complaint   Leg Swelling   History of Present Illness   Valerie Howard is a 47 y.o. year-old female presents to the ED with chief complaint of bilateral foot pain.  She states that her feet are cold and she needs to warm them up.  She is dealt with this before.  She is homeless.  She is reportedly working with case management/social work on housing.  She states that this is not a new problem for her..  History provided by patient.   Review of Systems  Pertinent positive and negative review of systems noted in HPI.    Physical Exam   Vitals:   11/15/22 0241 11/15/22 0605  BP: (!) 159/93 (!) 145/90  Pulse: 91 85  Resp: 18 15  Temp: 98 F (36.7 C) 98 F (36.7 C)  SpO2: 97% 98%    CONSTITUTIONAL:  non toxic-appearing, NAD NEURO:  Alert and oriented x 3, CN 3-12 grossly intact EYES:  eyes equal and reactive ENT/NECK:  Supple, no stridor  CARDIO:  normal rate, regular rhythm, appears well-perfused, difficult to palpate distal pulses, but easily dopplerable dorsalis pedis and posterior tibialis pulses bilateral PULM:  No respiratory distress,  GI/GU:  non-distended,  MSK/SPINE:  No gross deformities, mild BLE edema, moves all extremities  SKIN:  no rash, atraumatic   *Additional and/or pertinent findings included in MDM below  Diagnostic and Interventional Summary    EKG Interpretation  Date/Time:    Ventricular Rate:    PR Interval:    QRS Duration:   QT Interval:    QTC Calculation:   R Axis:     Text Interpretation:         Labs Reviewed - No data to display  No orders to display    Medications - No data to display   Procedures  /  Critical Care Procedures  ED Course and Medical Decision Making  I have reviewed the triage vital signs, the nursing notes, and pertinent available records from the EMR.  Social Determinants  Affecting Complexity of Care: Patient is homelessness.   ED Course:    Medical Decision Making Patient is homeless with cold and sore feet.  I do not see any erythema to suggest infection.  She does have some mild bilateral edema, which she says is not new.  She has dopplerable pulses.  She states that as her feet are warmed up, they are feeling better.  I offered TOC consult for PCP assistance, but patient declines this.  I recommended that she get some compression stockings for her mild edema, but she also declines this.  I don't think that she needs any further ER workup or evaluation.     Consultants: No consultations were needed in caring for this patient.   Treatment and Plan: Emergency department workup does not suggest an emergent condition requiring admission or immediate intervention beyond  what has been performed at this time. The patient is safe for discharge and has  been instructed to return immediately for worsening symptoms, change in  symptoms or any other concerns    Final Clinical Impressions(s) / ED Diagnoses     ICD-10-CM   1. Peripheral edema  R60.9     2. Pain in both lower extremities  M79.604    M79.605       ED Discharge Orders     None  Discharge Instructions Discussed with and Provided to Patient:   Discharge Instructions   None      Montine Circle, PA-C 11/15/22 [id number]    Ripley Fraise, MD 11/15/22 [phone]

## 2022-11-15 NOTE — ED Triage Notes (Signed)
Pt arrived by EMS complaining of bilateral leg and thigh swelling and some pain on the right side of her leg.   Pt denies hx of cardiac issues.  Pt states that she was having foot pain and swelling  a couple of weeks ago but now the legs are swelling as well   Pt also states that her foot are cold and hurt because she has been sleeping outside

## 2022-11-20 ENCOUNTER — Emergency Department (HOSPITAL_COMMUNITY)
Admission: EM | Admit: 2022-11-20 | Discharge: 2022-11-20 | Disposition: A | Discharge status: Home / Self Care | Payer: Medicaid Other | Attending: Emergency Medicine | Admitting: Emergency Medicine

## 2022-11-20 ENCOUNTER — Other Ambulatory Visit: Payer: Self-pay

## 2022-11-20 ENCOUNTER — Encounter (HOSPITAL_COMMUNITY): Payer: Self-pay | Admitting: Emergency Medicine

## 2022-11-20 DIAGNOSIS — Z5900 Homelessness unspecified: Secondary | ICD-10-CM | POA: Diagnosis not present

## 2022-11-20 DIAGNOSIS — M79671 Pain in right foot: Secondary | ICD-10-CM | POA: Insufficient documentation

## 2022-11-20 DIAGNOSIS — M79672 Pain in left foot: Secondary | ICD-10-CM | POA: Diagnosis not present

## 2022-11-20 MED ORDER — MEGESTROL ACETATE 40 MG PO TABS: 40.0000 mg | ORAL_TABLET | Freq: Every day | ORAL | 0 refills | Status: DC

## 2022-11-20 NOTE — ED Provider Notes (Signed)
[facility] Provider Note   CSN: [id number] Arrival date & time: 11/20/22  0045     History  Chief Complaint  Patient presents with   Foot Pain    Valerie Howard is a 47 y.o. female.   Foot Pain  Patient is here in the emergency department complaining of bilateral foot pain and swelling.  She states that she is homeless and has been outside in the cold.  She denies injuries to her feet.  She denies any chest pain difficulty breathing shortness of breath lightness or dizziness.     Home Medications Prior to Admission medications   Medication Sig Start Date End Date Taking? Authorizing Provider  acetaminophen (TYLENOL) 500 MG tablet Take 1 tablet (500 mg total) by mouth every 6 (six) hours as needed. 10/01/22   Domenic Moras, PA-C  benzonatate (TESSALON) 100 MG capsule Take 1 capsule (100 mg total) by mouth 3 (three) times daily as needed for cough. 10/01/22   Domenic Moras, PA-C  megestrol (MEGACE) 40 MG tablet Take 1 tablet (40 mg total) by mouth daily for 14 days. 11/20/22 12/04/22  Tedd Sias, PA      Allergies    Penicillins    Review of Systems   Review of Systems  Physical Exam Updated Vital Signs BP (!) 142/81 (BP Location: Right Arm)   Pulse 69   Temp 98.6 F (37 C) (Oral)   Resp 19   SpO2 98%  Physical Exam Vitals and nursing note reviewed.  Constitutional:      General: She is not in acute distress.    Appearance: Normal appearance. She is not ill-appearing.  HENT:     Head: Normocephalic and atraumatic.  Eyes:     General: No scleral icterus.       Right eye: No discharge.        Left eye: No discharge.     Conjunctiva/sclera: Conjunctivae normal.  Pulmonary:     Effort: Pulmonary effort is normal.     Breath sounds: No stridor.  Musculoskeletal:     Comments: Bilateral feet with DP PT pulses that are intact and symmetric, sensation intact cap refill less than 2 seconds.  They are cold to touch.  Full  range of motion of toes and ankle knees  Skin:    General: Skin is warm and dry.  Neurological:     Mental Status: She is alert and oriented to person, place, and time. Mental status is at baseline.     ED Results / Procedures / Treatments   Labs (all labs ordered are listed, but only abnormal results are displayed) Labs Reviewed - No data to display  EKG None  Radiology No results found.  Procedures Procedures    Medications Ordered in ED Medications - No data to display  ED Course/ Medical Decision Making/ A&P                             Medical Decision Making Risk Prescription drug management.   BL feet cool to touch but good pulses, cap refill and sensation is normal.   Normal vital signs.  Full range of motion of toes and feet and ankle and knees.  Will discharge home at this time.  I did keep patient for greater than 1 hour with warm blankets on her feet and her feet are not warm.   Final Clinical Impression(s) / ED Diagnoses  Final diagnoses:  Bilateral foot pain    Rx / DC Orders ED Discharge Orders          Ordered    megestrol (MEGACE) 40 MG tablet  Daily        11/20/22 0127              Pati Gallo [place], [place] [id number] [phone]    Delora Fuel, [place] [id number] [phone]

## 2022-11-20 NOTE — ED Notes (Signed)
Security at bedside, pt got agitated when it was time for d/c.

## 2022-11-20 NOTE — ED Triage Notes (Signed)
Patient arrived via [facility] with complaints of bilateral worsening foot pain and edema. Patient states she was seen here multiple times for same but pain and edema have been worsening. Patient alerts and oriented x43 at this time.   Per EMS:  BP-162/100 RR-20 HR-78 CBG-98

## 2022-11-28 ENCOUNTER — Emergency Department (HOSPITAL_COMMUNITY)
Admission: EM | Admit: 2022-11-28 | Discharge: 2022-11-28 | Disposition: A | Discharge status: Home / Self Care | Payer: Medicaid Other | Attending: Emergency Medicine | Admitting: Emergency Medicine

## 2022-11-28 ENCOUNTER — Encounter (HOSPITAL_COMMUNITY): Payer: Self-pay

## 2022-11-28 ENCOUNTER — Other Ambulatory Visit: Payer: Self-pay

## 2022-11-28 DIAGNOSIS — Z760 Encounter for issue of repeat prescription: Secondary | ICD-10-CM | POA: Insufficient documentation

## 2022-11-28 DIAGNOSIS — M79671 Pain in right foot: Secondary | ICD-10-CM | POA: Insufficient documentation

## 2022-11-28 DIAGNOSIS — M79672 Pain in left foot: Secondary | ICD-10-CM | POA: Diagnosis not present

## 2022-11-28 DIAGNOSIS — Z5900 Homelessness unspecified: Secondary | ICD-10-CM | POA: Diagnosis not present

## 2022-11-28 DIAGNOSIS — G8929 Other chronic pain: Secondary | ICD-10-CM | POA: Insufficient documentation

## 2022-11-28 MED ORDER — MEGESTROL ACETATE 40 MG PO TABS: 40.0000 mg | ORAL_TABLET | Freq: Every day | ORAL | 0 refills | Status: AC

## 2022-11-28 NOTE — Discharge Instructions (Signed)
Your exam today was overall reassuring.  Refilled your medication has been sent into the pharmacy.  For any concerning symptoms return to the emergency department.

## 2022-11-28 NOTE — ED Provider Notes (Signed)
[facility] Provider Note   CSN: [id number] Arrival date & time: 11/28/22  [id number]     History  Chief Complaint  Patient presents with   Vaginal Bleeding   Leg Swelling    Valerie Howard is a 47 y.o. female.  47 year old female presents today for evaluation of bilateral feet pain.  This is not a new issue.  Has been seen here in the past.  She states she has not gotten any better.  Denies history of CHF, PE, DVT, diabetes.  She is homeless and spends a lot of time outside.  Has not taken any medication other than her Megace.  She states she does not want to take any other medications other than her Megace.  She is also requesting a refill for her migraines.  Denies any heavy vaginal bleeding or other complaints.  She states she was given a paper prescription for the Megace last time however she lost the prescription and would like it sent to the [facility] outpatient pharmacy.  The history is provided by the patient. No language interpreter was used.       Home Medications Prior to Admission medications   Medication Sig Start Date End Date Taking? Authorizing Provider  acetaminophen (TYLENOL) 500 MG tablet Take 1 tablet (500 mg total) by mouth every 6 (six) hours as needed. 10/01/22   Domenic Moras, PA-C  benzonatate (TESSALON) 100 MG capsule Take 1 capsule (100 mg total) by mouth 3 (three) times daily as needed for cough. 10/01/22   Domenic Moras, PA-C  megestrol (MEGACE) 40 MG tablet Take 1 tablet (40 mg total) by mouth daily for 14 days. 11/20/22 12/04/22  Tedd Sias, PA      Allergies    Penicillins    Review of Systems   Review of Systems  Eyes:  Negative for visual disturbance.  Genitourinary:  Negative for vaginal bleeding.  Musculoskeletal:  Positive for arthralgias. Negative for gait problem and joint swelling.  Neurological:  Negative for light-headedness.  All other systems reviewed and are negative.   Physical Exam Updated Vital  Signs Ht 5\' 7"  (1.702 m)   Wt 95.3 kg   BMI 32.89 kg/m  Physical Exam Vitals and nursing note reviewed.  Constitutional:      General: She is not in acute distress.    Appearance: Normal appearance. She is not ill-appearing.  HENT:     Head: Normocephalic and atraumatic.     Nose: Nose normal.  Eyes:     Conjunctiva/sclera: Conjunctivae normal.  Pulmonary:     Effort: Pulmonary effort is normal. No respiratory distress.  Musculoskeletal:        General: No deformity.     Comments: 2+ DP pulse present bilaterally.  Full range of motion in bilateral ankles, and knees.  Patient able ambulate without difficulty.  No areas of tenderness over the foot.  Skin:    Findings: No rash.  Neurological:     Mental Status: She is alert.     ED Results / Procedures / Treatments   Labs (all labs ordered are listed, but only abnormal results are displayed) Labs Reviewed - No data to display  EKG None  Radiology No results found.  Procedures Procedures    Medications Ordered in ED Medications - No data to display  ED Course/ Medical Decision Making/ A&P  Medical Decision Making  47 year old female presents today for evaluation of pain to bilateral feet, and request for refill of her Megace.  Exam overall reassuring of bilateral lower extremities.  She is able ambulate without difficulty.  We discussed options for conservative management but she states she does not want to take any other medications.  No concern for CHF as there is no prior history and no pitting edema.  No prior history of DVT.  No asymmetrical swelling.  Symptomatic management discussed.  Patient is requesting for discharge without additional workup.  Is requesting refill for Megace.  Refill prescribed.  Patient is appropriate for discharge.  Final Clinical Impression(s) / ED Diagnoses Final diagnoses:  Medication refill  Chronic pain of both feet    Rx / DC Orders ED Discharge  Orders          Ordered    megestrol (MEGACE) 40 MG tablet  Daily        11/28/22 1020              Evlyn Courier, PA-C 11/28/22 1021    Regan Lemming, MD 11/28/22 2002

## 2022-11-28 NOTE — ED Triage Notes (Addendum)
Pt came to ED for vaginal bleeding and leg swelling. Pt axox4. Pt states she on her period.

## 2023-01-25 ENCOUNTER — Other Ambulatory Visit: Payer: Self-pay

## 2023-01-25 ENCOUNTER — Emergency Department (HOSPITAL_COMMUNITY)
Admission: EM | Admit: 2023-01-25 | Discharge: 2023-01-25 | Disposition: A | Discharge status: Home / Self Care | Payer: Medicaid Other | Attending: Emergency Medicine | Admitting: Emergency Medicine

## 2023-01-25 DIAGNOSIS — N939 Abnormal uterine and vaginal bleeding, unspecified: Secondary | ICD-10-CM | POA: Insufficient documentation

## 2023-01-25 LAB — CBC WITH DIFFERENTIAL/PLATELET
Abs Immature Granulocytes: 0.01 10*3/uL (ref 0.00–0.07)
Basophils Absolute: 0 10*3/uL (ref 0.0–0.1)
Basophils Relative: 1 %
Eosinophils Absolute: 0.1 10*3/uL (ref 0.0–0.5)
Eosinophils Relative: 2 %
HCT: 40.4 % (ref 36.0–46.0)
Hemoglobin: 12.2 g/dL (ref 12.0–15.0)
Immature Granulocytes: 0 %
Lymphocytes Relative: 35 %
Lymphs Abs: 1.7 10*3/uL (ref 0.7–4.0)
MCH: 26.6 pg (ref 26.0–34.0)
MCHC: 30.2 g/dL (ref 30.0–36.0)
MCV: 88.2 fL (ref 80.0–100.0)
Monocytes Absolute: 0.4 10*3/uL (ref 0.1–1.0)
Monocytes Relative: 8 %
Neutro Abs: 2.6 10*3/uL (ref 1.7–7.7)
Neutrophils Relative %: 54 %
Platelets: 184 10*3/uL (ref 150–400)
RBC: 4.58 MIL/uL (ref 3.87–5.11)
RDW: 14.6 % (ref 11.5–15.5)
WBC: 4.8 10*3/uL (ref 4.0–10.5)
nRBC: 0 % (ref 0.0–0.2)

## 2023-01-25 LAB — COMPREHENSIVE METABOLIC PANEL
ALT: 12 U/L (ref 0–44)
AST: 20 U/L (ref 15–41)
Albumin: 3.6 g/dL (ref 3.5–5.0)
Alkaline Phosphatase: 69 U/L (ref 38–126)
Anion gap: 7 (ref 5–15)
BUN: 9 mg/dL (ref 6–20)
CO2: 27 mmol/L (ref 22–32)
Calcium: 9.2 mg/dL (ref 8.9–10.3)
Chloride: 103 mmol/L (ref 98–111)
Creatinine, Ser: 0.85 mg/dL (ref 0.44–1.00)
GFR, Estimated: >60 mL/min (ref >60)
Glucose, Bld: 86 mg/dL (ref 70–99)
Potassium: 4.7 mmol/L (ref 3.5–5.1)
Sodium: 137 mmol/L (ref 135–145)
Total Bilirubin: 0.8 mg/dL (ref 0.3–1.2)
Total Protein: 7.2 g/dL (ref 6.5–8.1)

## 2023-01-25 LAB — I-STAT BETA HCG BLOOD, ED (MC, WL, AP ONLY): I-stat hCG, quantitative: <5 m[IU]/mL (ref <5)

## 2023-01-25 MED ORDER — SODIUM CHLORIDE 0.9 % IV BOLUS
1000.0000 mL | Freq: Once | INTRAVENOUS | Status: AC
  Administered 2023-01-25: 1000 mL via INTRAVENOUS

## 2023-01-25 NOTE — ED Triage Notes (Signed)
Patient reports irregular menstrual cycle , no bleeding at this time , denies abdominal cramping .

## 2023-01-25 NOTE — ED Provider Notes (Signed)
[place] EMERGENCY DEPARTMENT AT [facility] Provider Note   CSN: [id number] Arrival date & time: 01/25/23  0456     History  Chief Complaint  Patient presents with   Irregular Menstrual Cycle    Valerie Howard is a 47 y.o. female.  HPI 47 year old female presents with vaginal bleeding. Started having some bleeding this AM. It was not a lot, and not as much as she's had in the past, but she was concerned because she's had issues with vaginal bleeding in the past. Last normal cycle was at the beginning of the month. She's not sure if this is a normal cycle yet or not. She also felt transiently dizzy like she might pass out. Denies chest pain, dyspnea, abdominal pain or syncope. Is not currently dizzy. Used to be on megace, but hasn't been on this since April.   Home Medications Prior to Admission medications   Medication Sig Start Date End Date Taking? Authorizing Provider  acetaminophen (TYLENOL) 500 MG tablet Take 1 tablet (500 mg total) by mouth every 6 (six) hours as needed. 10/01/22   Fayrene Helper, PA-C  benzonatate (TESSALON) 100 MG capsule Take 1 capsule (100 mg total) by mouth 3 (three) times daily as needed for cough. 10/01/22   Fayrene Helper, PA-C      Allergies    Penicillins    Review of Systems   Review of Systems  Constitutional:  Negative for fever.  Respiratory:  Negative for shortness of breath.   Cardiovascular:  Negative for chest pain.  Gastrointestinal:  Negative for abdominal pain.  Genitourinary:  Positive for vaginal bleeding. Negative for dysuria and vaginal discharge.  Neurological:  Positive for light-headedness.    Physical Exam Updated Vital Signs BP 120/80 (BP Location: Right Arm)   Pulse 73   Temp 97.7 F (36.5 C) (Oral)   Resp 18   SpO2 99%  Physical Exam Vitals and nursing note reviewed.  Constitutional:      General: She is not in acute distress.    Appearance: She is well-developed. She is not ill-appearing or diaphoretic.   HENT:     Head: Normocephalic and atraumatic.  Cardiovascular:     Rate and Rhythm: Normal rate and regular rhythm.     Heart sounds: Normal heart sounds.  Pulmonary:     Effort: Pulmonary effort is normal.     Breath sounds: Normal breath sounds.  Abdominal:     General: There is no distension.     Palpations: Abdomen is soft.     Tenderness: There is no abdominal tenderness.  Skin:    General: Skin is warm and dry.  Neurological:     Mental Status: She is alert.     ED Results / Procedures / Treatments   Labs (all labs ordered are listed, but only abnormal results are displayed) Labs Reviewed  COMPREHENSIVE METABOLIC PANEL  CBC WITH DIFFERENTIAL/PLATELET  I-STAT BETA HCG BLOOD, ED ([facility], [facility], [facility] ONLY)    EKG EKG Interpretation  Date/Time:  Wednesday Jan 25 2023 07:33:16 EDT Ventricular Rate:  78 PR Interval:  143 QRS Duration: 92 QT Interval:  365 QTC Calculation: 416 R Axis:   33 Text Interpretation: Sinus rhythm no acute ST/T changes similar to 2023 Confirmed by Pricilla Loveless [phone]) on 01/25/2023 8:12:28 AM  Radiology No results found.  Procedures Procedures    Medications Ordered in ED Medications  sodium chloride 0.9 % bolus 1,000 mL (0 mLs Intravenous Stopped 01/25/23 1058)  ED Course/ Medical Decision Making/ A&P                             Medical Decision Making Amount and/or Complexity of Data Reviewed Labs: ordered.    Details: Hemoglobin.  Not pregnant. ECG/medicine tests: ordered and independent interpretation performed.    Details: No ischemia or arrhythmia.   Patient has been in and out of the ER multiple times for vaginal bleeding.  She reports only a small amount today and has a known history of dysfunctional uterine bleeding.  However her vital signs are stable, she is currently asymptomatic, and her hemoglobin is normal.  She is not pregnant.  Will think acute imaging is warranted.  No infectious symptoms.  Will discharge with  return precautions.        Final Clinical Impression(s) / ED Diagnoses Final diagnoses:  Vaginal bleeding    Rx / DC Orders ED Discharge Orders     None         Pricilla Loveless, MD 01/25/23 [phone]

## 2023-02-21 ENCOUNTER — Other Ambulatory Visit: Payer: Self-pay

## 2023-02-21 ENCOUNTER — Encounter (HOSPITAL_COMMUNITY): Payer: Self-pay

## 2023-02-21 ENCOUNTER — Emergency Department (HOSPITAL_COMMUNITY)
Admission: EM | Admit: 2023-02-21 | Discharge: 2023-02-21 | Disposition: A | Discharge status: Home / Self Care | Payer: Medicaid Other | Attending: Emergency Medicine | Admitting: Emergency Medicine

## 2023-02-21 DIAGNOSIS — N939 Abnormal uterine and vaginal bleeding, unspecified: Secondary | ICD-10-CM | POA: Diagnosis present

## 2023-02-21 LAB — CBC WITH DIFFERENTIAL/PLATELET
Abs Immature Granulocytes: 0.01 10*3/uL (ref 0.00–0.07)
Basophils Absolute: 0 10*3/uL (ref 0.0–0.1)
Basophils Relative: 1 %
Eosinophils Absolute: 0 10*3/uL (ref 0.0–0.5)
Eosinophils Relative: 0 %
HCT: 37.4 % (ref 36.0–46.0)
Hemoglobin: 11.5 g/dL — ABNORMAL LOW (ref 12.0–15.0)
Immature Granulocytes: 0 %
Lymphocytes Relative: 26 %
Lymphs Abs: 1.2 10*3/uL (ref 0.7–4.0)
MCH: 27.1 pg (ref 26.0–34.0)
MCHC: 30.7 g/dL (ref 30.0–36.0)
MCV: 88.2 fL (ref 80.0–100.0)
Monocytes Absolute: 0.3 10*3/uL (ref 0.1–1.0)
Monocytes Relative: 7 %
Neutro Abs: 3.1 10*3/uL (ref 1.7–7.7)
Neutrophils Relative %: 66 %
Platelets: 282 10*3/uL (ref 150–400)
RBC: 4.24 MIL/uL (ref 3.87–5.11)
RDW: 15.7 % — ABNORMAL HIGH (ref 11.5–15.5)
WBC: 4.7 10*3/uL (ref 4.0–10.5)
nRBC: 0 % (ref 0.0–0.2)

## 2023-02-21 LAB — BASIC METABOLIC PANEL
Anion gap: 9 (ref 5–15)
BUN: 10 mg/dL (ref 6–20)
CO2: 22 mmol/L (ref 22–32)
Calcium: 8.7 mg/dL — ABNORMAL LOW (ref 8.9–10.3)
Chloride: 105 mmol/L (ref 98–111)
Creatinine, Ser: 0.95 mg/dL (ref 0.44–1.00)
GFR, Estimated: >60 mL/min (ref >60)
Glucose, Bld: 86 mg/dL (ref 70–99)
Potassium: 3.8 mmol/L (ref 3.5–5.1)
Sodium: 136 mmol/L (ref 135–145)

## 2023-02-21 LAB — HCG, QUANTITATIVE, PREGNANCY: hCG, Beta Chain, Quant, S: <1 m[IU]/mL (ref <5)

## 2023-02-21 NOTE — Discharge Instructions (Addendum)
Your labs are reassuring today, your hemoglobin is stable, and you are not pregnant.  Please make sure you are drinking lots of fluids, and resting.  If you are bleeding through more than 1 pad an hour, please return to the ER.

## 2023-02-21 NOTE — ED Triage Notes (Addendum)
Pt c/o lower abdominal cramping and vaginal bleeding.  Pain score 2/10.  Pt reports this is her normal menstrual cycle, but sts she is afraid she is going to have a "bleeding event."  Pt cannot elaborate on what she means by bleeding event.  Pt refuses to put on a gown.    Pt reports currently she is just "spotting."  Sts she is unable to get to a OB/GYN d/t no transportation.

## 2023-02-21 NOTE — ED Provider Notes (Signed)
[place] EMERGENCY DEPARTMENT AT [facility] Provider Note   CSN: [id number] Arrival date & time: 02/21/23  3762     History  Chief Complaint  Patient presents with   Vaginal Bleeding    ENGLISH TOMER is a 47 y.o. female history of abnormal uterine bleeding, who presents to the ED because she is afraid she is going to have a bleeding event.  She states that she just started her period, and she is afraid she is going to have worsening bleeding.  She states she is only spotting at this time. Has been told that she has abnormal uterine bleeding multiple times, but states she cannot go and see a gynecologist because she does not want to take the bus.  Denies any kind of current abdominal pain, nausea, vomiting, dizziness, lightheadedness.    Home Medications Prior to Admission medications   Medication Sig Start Date End Date Taking? Authorizing Provider  acetaminophen (TYLENOL) 500 MG tablet Take 1 tablet (500 mg total) by mouth every 6 (six) hours as needed. 10/01/22  Yes Fayrene Helper, PA-C  benzonatate (TESSALON) 100 MG capsule Take 1 capsule (100 mg total) by mouth 3 (three) times daily as needed for cough. Patient not taking: Reported on 02/21/2023 10/01/22   Fayrene Helper, PA-C      Allergies    Penicillins    Review of Systems   Review of Systems  Gastrointestinal:  Negative for abdominal pain.  Genitourinary:  Positive for vaginal bleeding.    Physical Exam Updated Vital Signs BP 136/85   Pulse 73   Temp 98.2 F (36.8 C) (Oral)   Resp 18   Ht 5\' 7"  (1.702 m)   Wt 95.3 kg   LMP 02/21/2023   SpO2 100%   BMI 32.89 kg/m  Physical Exam Vitals and nursing note reviewed.  Constitutional:      General: She is not in acute distress.    Appearance: She is well-developed.  HENT:     Head: Normocephalic and atraumatic.  Eyes:     Conjunctiva/sclera: Conjunctivae normal.  Cardiovascular:     Rate and Rhythm: Normal rate and regular rhythm.     Heart sounds: No  murmur heard. Pulmonary:     Effort: Pulmonary effort is normal. No respiratory distress.     Breath sounds: Normal breath sounds.  Abdominal:     Palpations: Abdomen is soft.     Tenderness: There is no abdominal tenderness.  Musculoskeletal:        General: No swelling.     Cervical back: Neck supple.  Skin:    General: Skin is warm and dry.     Capillary Refill: Capillary refill takes less than 2 seconds.  Neurological:     Mental Status: She is alert.  Psychiatric:        Mood and Affect: Mood normal.     ED Results / Procedures / Treatments   Labs (all labs ordered are listed, but only abnormal results are displayed) Labs Reviewed  CBC WITH DIFFERENTIAL/PLATELET - Abnormal; Notable for the following components:      Result Value   Hemoglobin 11.5 (*)    RDW 15.7 (*)    All other components within normal limits  BASIC METABOLIC PANEL - Abnormal; Notable for the following components:   Calcium 8.7 (*)    All other components within normal limits  HCG, QUANTITATIVE, PREGNANCY    EKG None  Radiology No results found.  Procedures Procedures    Medications  Ordered in ED Medications - No data to display  ED Course/ Medical Decision Making/ A&P                             Medical Decision Making Patient is a 46 year old female, history of abnormal uterine bleeding, here for vaginal bleeding.  She states she started being.  She has not followed up w/OB/GYN.  We will obtain a CBC to evaluate for her hemoglobin.  Amount and/or Complexity of Data Reviewed Labs: ordered.    Details: Hemoglobin of 11+, negative pregnancy test Discussion of management or test interpretation with external provider(s): Discussed with patient, her hemoglobin is stable, and she has negative pregnancy test.  She is likely just menstruating.  She has a history of abnormal uterine bleeding, and should follow-up with her OB/GYN.  Denies any kind of pelvic cramping, or abdominal pain.   Discharged with strict return precautions      Final Clinical Impression(s) / ED Diagnoses Final diagnoses:  Vaginal bleeding    Rx / DC Orders ED Discharge Orders     None         Justise Ehmann, Harley [place], PA 02/21/23 1026    Jacalyn Lefevre, MD 02/21/23 1031

## 2023-05-12 ENCOUNTER — Emergency Department (HOSPITAL_COMMUNITY)
Admission: EM | Admit: 2023-05-12 | Discharge: 2023-05-12 | Disposition: A | Discharge status: Home / Self Care | Payer: MEDICAID | Attending: Emergency Medicine | Admitting: Emergency Medicine

## 2023-05-12 ENCOUNTER — Encounter (HOSPITAL_COMMUNITY): Payer: Self-pay

## 2023-05-12 ENCOUNTER — Emergency Department (HOSPITAL_COMMUNITY)
Admission: EM | Admit: 2023-05-12 | Discharge: 2023-05-13 | Disposition: A | Discharge status: Home / Self Care | Payer: MEDICAID | Source: Home / Self Care | Attending: Emergency Medicine | Admitting: Emergency Medicine

## 2023-05-12 ENCOUNTER — Other Ambulatory Visit: Payer: Self-pay

## 2023-05-12 DIAGNOSIS — Z59819 Housing instability, housed unspecified: Secondary | ICD-10-CM | POA: Diagnosis not present

## 2023-05-12 DIAGNOSIS — S99922A Unspecified injury of left foot, initial encounter: Secondary | ICD-10-CM | POA: Insufficient documentation

## 2023-05-12 DIAGNOSIS — X58XXXA Exposure to other specified factors, initial encounter: Secondary | ICD-10-CM | POA: Insufficient documentation

## 2023-05-12 DIAGNOSIS — S91311A Laceration without foreign body, right foot, initial encounter: Secondary | ICD-10-CM | POA: Insufficient documentation

## 2023-05-12 DIAGNOSIS — Z5321 Procedure and treatment not carried out due to patient leaving prior to being seen by health care provider: Secondary | ICD-10-CM | POA: Insufficient documentation

## 2023-05-12 DIAGNOSIS — S99921A Unspecified injury of right foot, initial encounter: Secondary | ICD-10-CM | POA: Diagnosis present

## 2023-05-12 NOTE — ED Notes (Signed)
Called for [facility] and unable to locate pt in lobby

## 2023-05-12 NOTE — ED Triage Notes (Signed)
Pt c/o laceration on heel of right footx1wk. Pt has approx 1 in lac on heel. The lac is not bleeding.

## 2023-05-12 NOTE — ED Provider Notes (Signed)
Patient eloped from the emergency department prior to my evaluation.  I was not able to see the patient.   Marita [place], PA-C 05/12/23 1745    Sloan Leiter, DO 05/14/23 2353

## 2023-05-13 ENCOUNTER — Encounter (HOSPITAL_COMMUNITY): Payer: Self-pay | Admitting: *Deleted

## 2023-05-13 ENCOUNTER — Other Ambulatory Visit: Payer: Self-pay

## 2023-05-13 NOTE — ED Triage Notes (Signed)
Rt foot heel has a lesion on it she does not know what happened to it.  The pt is homeless and she was checked in earlier today

## 2023-05-13 NOTE — ED Provider Notes (Signed)
[place] EMERGENCY DEPARTMENT AT [facility] Provider Note   CSN: [id number] Arrival date & time: 05/12/23  2354     History  Chief Complaint  Patient presents with   Foot Injury    Valerie Howard is a 47 y.o. female with history of homelessness who presents today with concern for injury to her left foot that occurred 1 to 2 weeks ago.  She does not recall the mechanism of the injury but states that given she has housing instability she was concerned she may develop an infection in the foot.  Denies fevers chills numbness tingling weakness in the leg, pain in the foot.  Also requesting a place to rest for the night.   I reviewed her medical records.  History of schizoaffective disorder, paranoid behavior.  No medications daily.  HPI     Home Medications Prior to Admission medications   Medication Sig Start Date End Date Taking? Authorizing Provider  acetaminophen (TYLENOL) 500 MG tablet Take 1 tablet (500 mg total) by mouth every 6 (six) hours as needed. 10/01/22   Fayrene Helper, PA-C  benzonatate (TESSALON) 100 MG capsule Take 1 capsule (100 mg total) by mouth 3 (three) times daily as needed for cough. Patient not taking: Reported on 02/21/2023 10/01/22   Fayrene Helper, PA-C      Allergies    Penicillins    Review of Systems   Review of Systems  Skin:  Positive for wound.    Physical Exam Updated Vital Signs BP (!) 133/93 (BP Location: Right Arm)   Pulse 80   Temp 97.8 F (36.6 C) (Oral)   Resp 20   Ht 5\' 7"  (1.702 m)   Wt 95.3 kg   LMP  (LMP Unknown)   SpO2 100%   BMI 32.91 kg/m  Physical Exam Vitals and nursing note reviewed.  HENT:     Head: Normocephalic and atraumatic.  Eyes:     General: No scleral icterus.       Right eye: No discharge.        Left eye: No discharge.     Conjunctiva/sclera: Conjunctivae normal.  Cardiovascular:     Rate and Rhythm: Normal rate and regular rhythm.  Pulmonary:     Effort: Pulmonary effort is normal.   Musculoskeletal:       Feet:  Skin:    General: Skin is warm and dry.     Findings: Laceration present.  Neurological:     General: No focal deficit present.     Mental Status: She is alert.  Psychiatric:        Mood and Affect: Mood normal.     ED Results / Procedures / Treatments   Labs (all labs ordered are listed, but only abnormal results are displayed) Labs Reviewed - No data to display  EKG None  Radiology No results found.  Procedures Procedures    Medications Ordered in ED Medications - No data to display  ED Course/ Medical Decision Making/ A&P                                 Medical Decision Making 47 year old female who presents with old wounds to the left foot, housing instability  Vital signs reassuring.  Cardiopulmonary abdominal exams are benign.  Patient with well-healing old laceration to the left heel that does not have evidence of infection at this time.  No cellulitic changes to the foot or  surrounding ankle.  Patient appears anxious, requesting place to rest.  Low census in the ED at this time, will allow patient to sleep in the emergency department until the bed is needed for other patients.  Noted patient for any further evaluation or workup of her left foot injury at this time.  Will allow her to continue to heal with secondary intention, will provide supplies to keep the wound covered when she is outdoors.   Clinical concern for emergent underlying injury or condition that would warrant further ED workup and patient management is exceedingly low. Charelle voiced understanding of her medical evaluation and treatment plan. Each of their questions answered to their expressed satisfaction.  Return precautions were given.  Patient is well-appearing, stable, and was discharged in good condition.  This chart was dictated using voice recognition software, Dragon. Despite the best efforts of this provider to proofread and correct errors, errors may still  occur which can change documentation meaning.  Final Clinical Impression(s) / ED Diagnoses Final diagnoses:  Injury of left foot, initial encounter    Rx / DC Orders ED Discharge Orders     None         Sherrilee Gilles 05/13/23 1610    Tilden Fossa, MD 05/13/23 [phone]

## 2023-06-11 ENCOUNTER — Emergency Department (HOSPITAL_COMMUNITY)
Admission: EM | Admit: 2023-06-11 | Discharge: 2023-06-11 | Disposition: A | Discharge status: Home / Self Care | Payer: MEDICAID | Attending: Emergency Medicine | Admitting: Emergency Medicine

## 2023-06-11 ENCOUNTER — Encounter (HOSPITAL_COMMUNITY): Payer: Self-pay

## 2023-06-11 ENCOUNTER — Other Ambulatory Visit: Payer: Self-pay

## 2023-06-11 DIAGNOSIS — Z5900 Homelessness unspecified: Secondary | ICD-10-CM | POA: Insufficient documentation

## 2023-06-11 DIAGNOSIS — Z5189 Encounter for other specified aftercare: Secondary | ICD-10-CM

## 2023-06-11 DIAGNOSIS — N939 Abnormal uterine and vaginal bleeding, unspecified: Secondary | ICD-10-CM

## 2023-06-11 LAB — CBC
HCT: 36.4 % (ref 36.0–46.0)
Hemoglobin: 11.2 g/dL — ABNORMAL LOW (ref 12.0–15.0)
MCH: 28 pg (ref 26.0–34.0)
MCHC: 30.8 g/dL (ref 30.0–36.0)
MCV: 91 fL (ref 80.0–100.0)
Platelets: 278 10*3/uL (ref 150–400)
RBC: 4 MIL/uL (ref 3.87–5.11)
RDW: 13.5 % (ref 11.5–15.5)
WBC: 4.9 10*3/uL (ref 4.0–10.5)
nRBC: 0 % (ref 0.0–0.2)

## 2023-06-11 LAB — HCG, SERUM, QUALITATIVE: Preg, Serum: NEGATIVE

## 2023-06-11 NOTE — ED Provider Notes (Addendum)
[name] Provider Note   CSN: [id number] Arrival date & time: 06/11/23  1709     History Chief Complaint  Patient presents with   Vaginal Bleeding   Foot Injury    Valerie Howard is a 47 y.o. female.  Patient presents to the emergency department with concerns of a wound check and vaginal bleeding.  Reports that she is having vaginal bleeding ongoing for the last 3 days.  When asked about menstrual period she reports that her period is currently on track however she is concerned that she is having more bleeding than typical for her baseline periods.  She denies using tampons so she is unsure exactly how much she is bleeding but states that she has had "pools of blood".  When asked further about the bleeding she reports that the bleeding is now stopped.  She has been seen in the emergency department several times for similar concerns with large unremarkable workups at that time.  Patient upfront states that she will not had a pelvic exam performed.  Patient denies any urinary symptoms such as dysuria, hematuria, increased urinary frequency or urgency.   Vaginal Bleeding Foot Injury      Home Medications Prior to Admission medications   Medication Sig Start Date End Date Taking? Authorizing Provider  acetaminophen (TYLENOL) 500 MG tablet Take 1 tablet (500 mg total) by mouth every 6 (six) hours as needed. 10/01/22   Fayrene Helper, PA-C  benzonatate (TESSALON) 100 MG capsule Take 1 capsule (100 mg total) by mouth 3 (three) times daily as needed for cough. Patient not taking: Reported on 02/21/2023 10/01/22   Fayrene Helper, PA-C      Allergies    Penicillins    Review of Systems   Review of Systems  Genitourinary:  Positive for vaginal bleeding.  All other systems reviewed and are negative.   Physical Exam Updated Vital Signs BP 106/64 (BP Location: Right Arm)   Pulse 88   Temp 98.4 F (36.9 C)   Resp 14   Ht 5\' 7"  (1.702 m)   Wt  74.8 kg   LMP  (LMP Valerie)   SpO2 98%   BMI 25.84 kg/m  Physical Exam Vitals and nursing note reviewed.  Constitutional:      General: She is not in acute distress.    Appearance: She is well-developed.  HENT:     Head: Normocephalic and atraumatic.  Eyes:     Conjunctiva/sclera: Conjunctivae normal.  Cardiovascular:     Rate and Rhythm: Normal rate and regular rhythm.     Heart sounds: No murmur heard. Pulmonary:     Effort: Pulmonary effort is normal. No respiratory distress.     Breath sounds: Normal breath sounds.  Abdominal:     General: Abdomen is flat. Bowel sounds are normal.     Palpations: Abdomen is soft.     Tenderness: There is no abdominal tenderness.  Genitourinary:    Comments: Patient refused pelvic exam. Musculoskeletal:        General: No swelling.     Cervical back: Neck supple.  Skin:    General: Skin is warm and dry.     Capillary Refill: Capillary refill takes less than 2 seconds.  Neurological:     Mental Status: She is alert.  Psychiatric:        Mood and Affect: Mood normal.     ED Results / Procedures / Treatments   Labs (all labs ordered are  listed, but only abnormal results are displayed) Labs Reviewed  CBC - Abnormal; Notable for the following components:      Result Value   Hemoglobin 11.2 (*)    All other components within normal limits  HCG, SERUM, QUALITATIVE    EKG None  Radiology No results found.  Procedures Procedures   Medications Ordered in ED Medications - No data to display  ED Course/ Medical Decision Making/ A&P                               Medical Decision Making Amount and/or Complexity of Data Reviewed Labs: ordered.   This patient presents to the ED for concern of vaginal bleeding, wound check.  Differential diagnosis includes abnormal uterine bleeding, miscarriage, cellulitis, laceration   Lab Tests:  I Ordered, and personally interpreted labs.  The pertinent results include: CBC with  hemoglobin 11.2 which is close to patient's baseline with no acute decline, qualitative hCG negative   Problem List / ED Course:  Patient presents to the emergency department concerns of vaginal bleeding and a wound check.  Reports that she is currently homeless and is typically outdoors and has had worsening vaginal bleeding her last few days.  When asked further about the bleeding she does report that the bleeding has now stopped since arriving in the emergency department.  Patient has been seen extensively in the past for similar concerns and is currently refusing a pelvic exam.  She denies any pelvic or abdominal pain, vaginal discharge, or urinary symptoms.  Also has concerns about a possible wound on the heel of her right foot that has been present for several months.  States that the area "closed up" but is concerned that it not heal properly. On my assessment of the wound to the right heel, appears that this is an the pad of her heel with no apparent signs of infection such as cellulitis or drainage present.  There is no tenderness to palpation of the area either.  Appears that the area has a defect in the skin likely due to a laceration sustained to the callused region of the heel.  No acute indication of infection at this time and I do not feel the patient would benefit from antibiotic therapy for this or other evaluation or management.  Instead encourage patient to try to reduce the amount that she is ambulating and try to maintain her feet and dry when possible.  Given the patient not acutely having any vaginal bleeding and is refusing a pelvic exam at this time, no further indication for workup or treatment is needed.  Will plan on discharging patient.  Patient discharged home in stable condition.  Final Clinical Impression(s) / ED Diagnoses Final diagnoses:  Vaginal bleeding  Visit for wound check    Rx / DC Orders ED Discharge Orders     None         Smitty Knudsen,  PA-C 06/11/23 2115    Smitty Knudsen, PA-C 06/11/23 2115    Glyn Ade, MD 06/11/23 2227

## 2023-06-11 NOTE — ED Notes (Signed)
This nurse assessed patient and asked about the bleeding if she was currently bleeding. Pt responded that she thinks the bleeding has stopped and someone is controlling the bleeding with a remote. This nurse then asked if she could put a gown on for the pelvic exam and she refused. Pt stated she does not need a pelvic exam she needs the bleeding to stop. PA Zelaya advised.

## 2023-06-11 NOTE — Discharge Instructions (Signed)
You are seen emergency department a day for vaginal bleeding and a wound check.  Your wound on your foot appears unremarkable and given that your vaginal bleeding has stopped and you are not currently anemic, this is likely just more pronounced bleeding during her menstrual period.  Please return the emergency department for signs of worsening.  Otherwise follow-up with a primary care provider as possible.

## 2023-06-11 NOTE — ED Triage Notes (Signed)
Pt arrived stating she is homeless and having abnormal vaginal bleeding that has been ongoing x3 days. Pt also states she had an open wound on her right foot a month ago that closed up and is not bleeding but is still open and needs someone to look at it.

## 2023-06-12 ENCOUNTER — Other Ambulatory Visit: Payer: Self-pay

## 2023-06-12 ENCOUNTER — Emergency Department (HOSPITAL_COMMUNITY)
Admission: EM | Admit: 2023-06-12 | Discharge: 2023-06-12 | Disposition: A | Discharge status: Home / Self Care | Payer: MEDICAID

## 2023-06-12 ENCOUNTER — Emergency Department (HOSPITAL_COMMUNITY): Payer: MEDICAID

## 2023-06-12 ENCOUNTER — Encounter (HOSPITAL_COMMUNITY): Payer: Self-pay

## 2023-06-12 DIAGNOSIS — D259 Leiomyoma of uterus, unspecified: Secondary | ICD-10-CM | POA: Insufficient documentation

## 2023-06-12 DIAGNOSIS — N939 Abnormal uterine and vaginal bleeding, unspecified: Secondary | ICD-10-CM | POA: Diagnosis present

## 2023-06-12 DIAGNOSIS — D219 Benign neoplasm of connective and other soft tissue, unspecified: Secondary | ICD-10-CM

## 2023-06-12 LAB — CBC
HCT: 41.5 % (ref 36.0–46.0)
Hemoglobin: 12.3 g/dL (ref 12.0–15.0)
MCH: 28 pg (ref 26.0–34.0)
MCHC: 29.6 g/dL — ABNORMAL LOW (ref 30.0–36.0)
MCV: 94.5 fL (ref 80.0–100.0)
Platelets: 271 10*3/uL (ref 150–400)
RBC: 4.39 MIL/uL (ref 3.87–5.11)
RDW: 13.7 % (ref 11.5–15.5)
WBC: 6.3 10*3/uL (ref 4.0–10.5)
nRBC: 0 % (ref 0.0–0.2)

## 2023-06-12 LAB — BASIC METABOLIC PANEL
Anion gap: 9 (ref 5–15)
BUN: 12 mg/dL (ref 6–20)
CO2: 23 mmol/L (ref 22–32)
Calcium: 9.1 mg/dL (ref 8.9–10.3)
Chloride: 107 mmol/L (ref 98–111)
Creatinine, Ser: 0.87 mg/dL (ref 0.44–1.00)
GFR, Estimated: >60 mL/min (ref >60)
Glucose, Bld: 73 mg/dL (ref 70–99)
Potassium: 4.3 mmol/L (ref 3.5–5.1)
Sodium: 139 mmol/L (ref 135–145)

## 2023-06-12 LAB — PREGNANCY, URINE: Preg Test, Ur: NEGATIVE

## 2023-06-12 LAB — TSH: TSH: 1.332 u[IU]/mL (ref 0.350–4.500)

## 2023-06-12 MED ORDER — KETOROLAC TROMETHAMINE 15 MG/ML IJ SOLN
15.0000 mg | Freq: Once | INTRAMUSCULAR | Status: DC
  Filled 2023-06-12: qty 1

## 2023-06-12 MED ORDER — KETOROLAC TROMETHAMINE 15 MG/ML IJ SOLN: 15.0000 mg | Freq: Once | INTRAMUSCULAR | Status: DC

## 2023-06-12 MED ORDER — NAPROXEN 500 MG PO TABS: 500.0000 mg | ORAL_TABLET | Freq: Two times a day (BID) | ORAL | 0 refills | Status: DC

## 2023-06-12 NOTE — ED Provider Notes (Signed)
[place] EMERGENCY DEPARTMENT AT [facility] Provider Note   CSN: [id number] Arrival date & time: 06/12/23  7829     History  Chief Complaint  Patient presents with   Vaginal Bleeding    Valerie Howard is a 47 y.o. female.  47 year old female with past medical history of abnormal menstrual bleeding in the past presenting to the emergency department today with what she describes as excessive vaginal bleeding.  She reports that she started bleeding again on Tuesday of this week.  She states that she has been having relatively continuous bleeding since then.  She states that she does not use pads or tampons with concern for toxic shock syndrome.  She states that she is feeling lightheaded.  She states that she has had intermittent issues with vaginal bleeding in the past.  She denies any significant pain with this.  She states she has not seen an OB/GYN in years.   Vaginal Bleeding      Home Medications Prior to Admission medications   Medication Sig Start Date End Date Taking? Authorizing Provider  naproxen (NAPROSYN) 500 MG tablet Take 1 tablet (500 mg total) by mouth 2 (two) times daily. 06/12/23  Yes Durwin Glaze, MD  acetaminophen (TYLENOL) 500 MG tablet Take 1 tablet (500 mg total) by mouth every 6 (six) hours as needed. 10/01/22   Fayrene Helper, PA-C  benzonatate (TESSALON) 100 MG capsule Take 1 capsule (100 mg total) by mouth 3 (three) times daily as needed for cough. Patient not taking: Reported on 02/21/2023 10/01/22   Fayrene Helper, PA-C      Allergies    Penicillins    Review of Systems   Review of Systems  Genitourinary:  Positive for vaginal bleeding.  Neurological:  Positive for light-headedness.  All other systems reviewed and are negative.   Physical Exam Updated Vital Signs BP (!) 148/88   Pulse 78   Temp 98.1 F (36.7 C)   Resp 18   Ht 5\' 7"  (1.702 m)   Wt 74.8 kg   LMP 06/08/2023   SpO2 100%   BMI 25.84 kg/m  Physical Exam Vitals and  nursing note reviewed.   Gen: NAD Eyes: PERRL, EOMI HEENT: no oropharyngeal swelling Neck: trachea midline Resp: clear to auscultation bilaterally Card: RRR, no murmurs, rubs, or gallops Abd: nontender, nondistended Extremities: no calf tenderness, no edema Vascular: 2+ radial pulses bilaterally, 2+ DP pulses bilaterally Skin: no rashes Psyc: acting appropriately   ED Results / Procedures / Treatments   Labs (all labs ordered are listed, but only abnormal results are displayed) Labs Reviewed  CBC - Abnormal; Notable for the following components:      Result Value   MCHC 29.6 (*)    All other components within normal limits  BASIC METABOLIC PANEL  PREGNANCY, URINE  TSH    EKG None  Radiology US Pelvis Complete  Result Date: 06/12/2023 CLINICAL DATA:  Vaginal bleeding. EXAM: TRANSABDOMINAL ULTRASOUND OF PELVIS TECHNIQUE: Transabdominal ultrasound examination of the pelvis was performed including evaluation of the uterus, ovaries, adnexal regions, and pelvic cul-de-sac. COMPARISON:  09/09/2022 ultrasound and older FINDINGS: Uterus Measurements: 16.3 x 9.7 x 9.7 cm = volume: 803.4 mL. Multilobular heterogeneous myometrium identified with multiple fibroids as described previously. Example includes focus of the right posterior aspect of the mid uterine body measuring 7.3 x 7.3 x 6.5 cm. Focus of the fundus on the right side measures 5.9 x 5.8 x 5.0 cm and anterior left 6.5 x 5.6  x 5.9 cm. Some of these measure very differently from previous but this could be technical. The current study is somewhat limited due to contracted urinary bladder and overlapping soft tissue. Some of the fibroids have echogenic areas which may be calcification Endometrium Thickness: 10 mm. The endometrium has a areas of distortion which could relate to subendometrial components of the fibroids. Ovaries Poorly seen with overlapping bowel gas and soft tissue. Other findings:  No abnormal free fluid. IMPRESSION:  Limited examination as above. Enlarged uterus with multiple fibroids. Underlying endometrial distortion. Overall if there is concern further of the endometrium in the fibroid distribution a MRI with and without contrast of the female pelvis could be considered when clinically appropriate. Poor visualization of the ovaries Electronically Signed   By: Karen Kays M.D.   On: 06/12/2023 10:10    Procedures Procedures    Medications Ordered in ED Medications  ketorolac (TORADOL) 15 MG/ML injection 15 mg (has no administration in time range)    ED Course/ Medical Decision Making/ A&P                                 Medical Decision Making 47 year old female with past medical history of abnormal vaginal bleeding in the past presenting to the emergency department today with reports of increased vaginal bleeding.  The patient is hemodynamically stable here.  I will obtain basic labs here to evaluate for anemia.  Will obtain ultrasound here and reevaluate.  Due to IV fluid shortage and stable vital signs walled off on IV fluids at this time.  I will reevaluate for ultimate disposition.  Will also obtain an hCG to eval for pregnancy.  The patient's labs are reassuring.  Ultrasound shows fibroids which the patient is aware of.  The patient is given Toradol here and will be started on anti-inflammatories for this.  We did discuss pelvic exam but this was deferred by the patient after workup was complete.  She will be discharged with OB follow-up with return precautions.  Amount and/or Complexity of Data Reviewed Labs: ordered. Radiology: ordered.  Risk Prescription drug management.           Final Clinical Impression(s) / ED Diagnoses Final diagnoses:  Fibroids    Rx / DC Orders ED Discharge Orders          Ordered    naproxen (NAPROSYN) 500 MG tablet  2 times daily        06/12/23 1037              Durwin Glaze, MD 06/12/23 1038

## 2023-06-12 NOTE — ED Triage Notes (Signed)
Pt complaining of heavy menstrual bleeding since Friday. York Spaniel it is abnormal. Could not quantify how many sanitary napkins she is using.

## 2023-06-12 NOTE — Discharge Instructions (Signed)
Your ultrasound does show that you have fibroids.  Your blood counts here today are stable.  Please take the anti-inflammatories as prescribed which should help.  Please call to schedule a follow-up appointment with the OB/GYN office at the number provided.  Return to the ER for worsening symptoms.

## 2023-09-08 ENCOUNTER — Emergency Department (HOSPITAL_COMMUNITY)
Admission: EM | Admit: 2023-09-08 | Discharge: 2023-09-08 | Disposition: A | Discharge status: Home / Self Care | Payer: MEDICAID | Attending: Emergency Medicine | Admitting: Emergency Medicine

## 2023-09-08 ENCOUNTER — Other Ambulatory Visit: Payer: Self-pay

## 2023-09-08 ENCOUNTER — Encounter (HOSPITAL_COMMUNITY): Payer: Self-pay | Admitting: *Deleted

## 2023-09-08 DIAGNOSIS — N939 Abnormal uterine and vaginal bleeding, unspecified: Secondary | ICD-10-CM | POA: Insufficient documentation

## 2023-09-08 DIAGNOSIS — N938 Other specified abnormal uterine and vaginal bleeding: Secondary | ICD-10-CM

## 2023-09-08 DIAGNOSIS — Z5900 Homelessness unspecified: Secondary | ICD-10-CM | POA: Insufficient documentation

## 2023-09-08 LAB — BASIC METABOLIC PANEL
Anion gap: 7 (ref 5–15)
BUN: 7 mg/dL (ref 6–20)
CO2: 25 mmol/L (ref 22–32)
Calcium: 9 mg/dL (ref 8.9–10.3)
Chloride: 106 mmol/L (ref 98–111)
Creatinine, Ser: 0.88 mg/dL (ref 0.44–1.00)
GFR, Estimated: >60 mL/min (ref >60)
Glucose, Bld: 80 mg/dL (ref 70–99)
Potassium: 4.2 mmol/L (ref 3.5–5.1)
Sodium: 138 mmol/L (ref 135–145)

## 2023-09-08 LAB — CBC WITH DIFFERENTIAL/PLATELET
Abs Immature Granulocytes: 0.01 10*3/uL (ref 0.00–0.07)
Basophils Absolute: 0 10*3/uL (ref 0.0–0.1)
Basophils Relative: 1 %
Eosinophils Absolute: 0.1 10*3/uL (ref 0.0–0.5)
Eosinophils Relative: 1 %
HCT: 37.1 % (ref 36.0–46.0)
Hemoglobin: 11.3 g/dL — ABNORMAL LOW (ref 12.0–15.0)
Immature Granulocytes: 0 %
Lymphocytes Relative: 40 %
Lymphs Abs: 2.3 10*3/uL (ref 0.7–4.0)
MCH: 27.4 pg (ref 26.0–34.0)
MCHC: 30.5 g/dL (ref 30.0–36.0)
MCV: 90 fL (ref 80.0–100.0)
Monocytes Absolute: 0.5 10*3/uL (ref 0.1–1.0)
Monocytes Relative: 8 %
Neutro Abs: 2.8 10*3/uL (ref 1.7–7.7)
Neutrophils Relative %: 50 %
Platelets: 301 10*3/uL (ref 150–400)
RBC: 4.12 MIL/uL (ref 3.87–5.11)
RDW: 13.6 % (ref 11.5–15.5)
WBC: 5.7 10*3/uL (ref 4.0–10.5)
nRBC: 0 % (ref 0.0–0.2)

## 2023-09-08 LAB — HCG, QUANTITATIVE, PREGNANCY: hCG, Beta Chain, Quant, S: <1 m[IU]/mL (ref <5)

## 2023-09-08 NOTE — ED Notes (Signed)
 Attempted to review discharge instructions with pt. She interrupted saying she does not need help.   Opportunity for questions and concerns provided.   Pt alert, oriented and ambulatory. All belongings in  her possession.   Encouraged to follow up with specialists for ongoing symptoms. Pt began to make spitting noises at DR as he explained treatment plan.

## 2023-09-08 NOTE — ED Notes (Signed)
 Pt. Refused to give a urine sample but would give blood

## 2023-09-08 NOTE — ED Triage Notes (Signed)
 Pt. Stated, I've had vaginal bleeding off and on since December all month off and on just want to be checked.

## 2023-09-08 NOTE — ED Notes (Signed)
 Patient refused to put on gown when taken to room

## 2023-09-08 NOTE — ED Notes (Signed)
 [name], MD explained plan of care to patient. Pt understands plan of care and discharge. Pt a/o x4.

## 2023-09-08 NOTE — ED Provider Notes (Addendum)
 [place] EMERGENCY DEPARTMENT AT [facility] Provider Note   CSN: [id number] Arrival date & time: 09/08/23  1745     History  Chief Complaint  Patient presents with   multiple complaints    Valerie Howard is a 48 y.o. female.  Patient presents complaining of ongoing vaginal bleeding times several months.  Was just seen here few hours ago and had refused a pelvic exam as well as ultrasound.  Did have blood work which shows a stable hemoglobin.  Patient represents says that she wants to figure out what is going on.  Of note, patient is homeless.  Patient became very combative and argumentative when further questions were asked.  Patient cursed at me as well.  Refuses to give any further information       Home Medications Prior to Admission medications   Medication Sig Start Date End Date Taking? Authorizing Provider  acetaminophen  (TYLENOL ) 500 MG tablet Take 1 tablet (500 mg total) by mouth every 6 (six) hours as needed. 10/01/22   Nivia Colon, PA-C  benzonatate  (TESSALON ) 100 MG capsule Take 1 capsule (100 mg total) by mouth 3 (three) times daily as needed for cough. Patient not taking: Reported on 02/21/2023 10/01/22   Nivia Colon, PA-C  naproxen  (NAPROSYN ) 500 MG tablet Take 1 tablet (500 mg total) by mouth 2 (two) times daily. 06/12/23   Ula Prentice SAUNDERS, MD      Allergies    Penicillins    Review of Systems   Review of Systems  All other systems reviewed and are negative.   Physical Exam Updated Vital Signs BP (!) 150/96   Pulse 90   Temp 99.4 F (37.4 C)   Resp 18   Ht 1.702 m (5' 7)   Wt 95.3 kg   LMP 09/08/2023   SpO2 100%   BMI 32.91 kg/m  Physical Exam Vitals and nursing note reviewed.  Constitutional:      General: She is not in acute distress.    Appearance: Normal appearance. She is well-developed. She is not toxic-appearing.  HENT:     Head: Normocephalic and atraumatic.  Eyes:     General: Lids are normal.     Conjunctiva/sclera:  Conjunctivae normal.     Pupils: Pupils are equal, round, and reactive to light.  Neck:     Thyroid : No thyroid  mass.     Trachea: No tracheal deviation.  Cardiovascular:     Rate and Rhythm: Normal rate and regular rhythm.     Heart sounds: Normal heart sounds. No murmur heard.    No gallop.  Pulmonary:     Effort: Pulmonary effort is normal. No respiratory distress.     Breath sounds: Normal breath sounds. No stridor. No decreased breath sounds, wheezing, rhonchi or rales.  Abdominal:     General: There is no distension.     Palpations: Abdomen is soft.     Tenderness: There is no abdominal tenderness. There is no rebound.  Musculoskeletal:        General: No tenderness. Normal range of motion.     Cervical back: Normal range of motion and neck supple.  Skin:    General: Skin is warm and dry.     Findings: No abrasion or rash.  Neurological:     Mental Status: She is alert and oriented to person, place, and time. Mental status is at baseline.     GCS: GCS eye subscore is 4. GCS verbal subscore is 5. GCS motor  subscore is 6.     Cranial Nerves: No cranial nerve deficit.     Sensory: No sensory deficit.     Motor: Motor function is intact.  Psychiatric:        Attention and Perception: Attention normal.        Speech: Speech normal.        Behavior: Behavior normal.     ED Results / Procedures / Treatments   Labs (all labs ordered are listed, but only abnormal results are displayed) Labs Reviewed - No data to display  EKG None  Radiology No results found.  Procedures Procedures    Medications Ordered in ED Medications - No data to display  ED Course/ Medical Decision Making/ A&P                                 Medical Decision Making  Patient with reassuring recent workup a few hours ago.  Cooperative at this time with interview.  Patient had been given follow-up instructions on prior visit.  Patient to be discharged        Final Clinical  Impression(s) / ED Diagnoses Final diagnoses:  None    Rx / DC Orders ED Discharge Orders     None         Dasie Faden, MD 09/08/23 1925    Dasie Faden, MD 09/08/23 1925

## 2023-09-08 NOTE — ED Triage Notes (Signed)
 The pt has multiple complaints  she was just discharged from the ed  but she is homeless and threatened to check back in  lmp now and dec she reports that she's hyere with vaginal bleeding

## 2023-09-08 NOTE — Discharge Instructions (Addendum)
 Please follow-up with your OB/GYN, if you do not have an OB/GYN, please use the 1 above.  The cause of your abnormal uterine bleeding, is unknown at this time.  You may need to be on birth control, or another hormonal supplementation to help with this.  You declined to a ultrasound today, but your blood work is reassuring.  Please return if you are bleeding through more than 1 pad per hour, get lightheaded, dizzy, or short of breath.

## 2023-09-08 NOTE — ED Provider Notes (Signed)
 [place] EMERGENCY DEPARTMENT AT [facility] Provider Note   CSN: [id number] Arrival date & time: 09/08/23  1011     History  Chief Complaint  Patient presents with   Vaginal Bleeding    Valerie Howard is a 48 y.o. female, history of schizophrenia, AUB, who presents to the ED secondary to intermittent vaginal bleeding, this been going on since November.  She has had intermittent vaginal bleeding, going on for about 10 to 15 days a month, since November.  She denies any trauma, no dysuria, vaginal discharge, or pelvic or abdominal pain.  States that she does have an OB/GYN but has not seen them recently.  Notes that she had a ultrasound, done in November and it was unremarkable.  She notes that the bleeding can be 3-4 pads a day, or just spotting.  Is not feeling lightheaded dizzy, or short of breath    Home Medications Prior to Admission medications   Medication Sig Start Date End Date Taking? Authorizing Provider  acetaminophen  (TYLENOL ) 500 MG tablet Take 1 tablet (500 mg total) by mouth every 6 (six) hours as needed. 10/01/22   Nivia Colon, PA-C  benzonatate  (TESSALON ) 100 MG capsule Take 1 capsule (100 mg total) by mouth 3 (three) times daily as needed for cough. Patient not taking: Reported on 02/21/2023 10/01/22   Nivia Colon, PA-C  naproxen  (NAPROSYN ) 500 MG tablet Take 1 tablet (500 mg total) by mouth 2 (two) times daily. 06/12/23   Ula Prentice SAUNDERS, MD      Allergies    Penicillins    Review of Systems   Review of Systems  Constitutional:  Negative for fever.  Genitourinary:  Positive for vaginal bleeding.    Physical Exam Updated Vital Signs BP 137/77 (BP Location: Right Arm)   Pulse (!) 116   Temp 98 F (36.7 C) (Oral)   Resp 18   Ht 5' 7 (1.702 m)   Wt 95.3 kg   SpO2 100%   BMI 32.89 kg/m  Physical Exam Vitals and nursing note reviewed.  Constitutional:      General: She is not in acute distress.    Appearance: She is well-developed.  HENT:      Head: Normocephalic and atraumatic.  Eyes:     Conjunctiva/sclera: Conjunctivae normal.  Cardiovascular:     Rate and Rhythm: Normal rate and regular rhythm.     Heart sounds: No murmur heard. Pulmonary:     Effort: Pulmonary effort is normal. No respiratory distress.     Breath sounds: Normal breath sounds.  Abdominal:     Palpations: Abdomen is soft.     Tenderness: There is no abdominal tenderness.  Genitourinary:    Comments: Pt declined pelvic exam Musculoskeletal:        General: No swelling.     Cervical back: Neck supple.  Skin:    General: Skin is warm and dry.     Capillary Refill: Capillary refill takes less than 2 seconds.  Neurological:     Mental Status: She is alert.  Psychiatric:        Mood and Affect: Mood normal.     ED Results / Procedures / Treatments   Labs (all labs ordered are listed, but only abnormal results are displayed) Labs Reviewed  CBC WITH DIFFERENTIAL/PLATELET - Abnormal; Notable for the following components:      Result Value   Hemoglobin 11.3 (*)    All other components within normal limits  BASIC METABOLIC PANEL  HCG, QUANTITATIVE, PREGNANCY  URINALYSIS, ROUTINE W REFLEX MICROSCOPIC    EKG None  Radiology No results found.  Procedures Procedures    Medications Ordered in ED Medications - No data to display  ED Course/ Medical Decision Making/ A&P                                 Medical Decision Making Patient is a 48 year old female, here for abnormal uterine bleeding that is been going on for the last 3 months.  She states that it can take 10 to 15 days out of the month, for her to bleed.  She states it can be a few pads a day, or can just be spotting.  She is overall well-appearing denies any abdominal pain, or any trauma to the cervix.  We discussed a ultrasound, and urinalysis, which she refused both.  She states she does not need an ultrasound.  We will obtain with blood work for further evaluation offered pelvic exam  which patient declined  Amount and/or Complexity of Data Reviewed Labs: ordered.    Details: Hemoglobin 11.2 similar to past, not pregnant Discussion of management or test interpretation with external provider(s): Discussed with patient, hemoglobin is stable, she is not pregnant, she has declined a pelvic exam as well as an ultrasound.  Abnormal uterine bleeding, cause unclear at this time.  Will have her follow-up gynecology, for further evaluation.  She voiced understanding.  Her blood work is stable, and she has no symptoms, other than the vaginal bleeding.  Discharged home with strict return cautions    Final Clinical Impression(s) / ED Diagnoses Final diagnoses:  Abnormal uterine bleeding (AUB)    Rx / DC Orders ED Discharge Orders     None         Nazario Russom, Lyle CROME, PA 09/08/23 1656    Franklyn Sid SAILOR, MD 09/09/23 2106

## 2023-09-09 ENCOUNTER — Emergency Department (HOSPITAL_COMMUNITY)
Admission: EM | Admit: 2023-09-09 | Discharge: 2023-09-09 | Disposition: A | Discharge status: Home / Self Care | Payer: MEDICAID | Attending: Emergency Medicine | Admitting: Emergency Medicine

## 2023-09-09 DIAGNOSIS — Z5900 Homelessness unspecified: Secondary | ICD-10-CM | POA: Insufficient documentation

## 2023-09-09 DIAGNOSIS — H43391 Other vitreous opacities, right eye: Secondary | ICD-10-CM | POA: Diagnosis not present

## 2023-09-09 DIAGNOSIS — N939 Abnormal uterine and vaginal bleeding, unspecified: Secondary | ICD-10-CM | POA: Diagnosis present

## 2023-09-09 LAB — CBC WITH DIFFERENTIAL/PLATELET
Abs Immature Granulocytes: 0.02 10*3/uL (ref 0.00–0.07)
Basophils Absolute: 0.1 10*3/uL (ref 0.0–0.1)
Basophils Relative: 1 %
Eosinophils Absolute: 0.1 10*3/uL (ref 0.0–0.5)
Eosinophils Relative: 1 %
HCT: 38.9 % (ref 36.0–46.0)
Hemoglobin: 11.9 g/dL — ABNORMAL LOW (ref 12.0–15.0)
Immature Granulocytes: 0 %
Lymphocytes Relative: 43 %
Lymphs Abs: 2.6 10*3/uL (ref 0.7–4.0)
MCH: 27.5 pg (ref 26.0–34.0)
MCHC: 30.6 g/dL (ref 30.0–36.0)
MCV: 89.8 fL (ref 80.0–100.0)
Monocytes Absolute: 0.5 10*3/uL (ref 0.1–1.0)
Monocytes Relative: 8 %
Neutro Abs: 2.8 10*3/uL (ref 1.7–7.7)
Neutrophils Relative %: 47 %
Platelets: 346 10*3/uL (ref 150–400)
RBC: 4.33 MIL/uL (ref 3.87–5.11)
RDW: 13.6 % (ref 11.5–15.5)
WBC: 6 10*3/uL (ref 4.0–10.5)
nRBC: 0 % (ref 0.0–0.2)

## 2023-09-09 LAB — COMPREHENSIVE METABOLIC PANEL
ALT: 11 U/L (ref 0–44)
AST: 17 U/L (ref 15–41)
Albumin: 3.9 g/dL (ref 3.5–5.0)
Alkaline Phosphatase: 67 U/L (ref 38–126)
Anion gap: 10 (ref 5–15)
BUN: 9 mg/dL (ref 6–20)
CO2: 24 mmol/L (ref 22–32)
Calcium: 9.1 mg/dL (ref 8.9–10.3)
Chloride: 105 mmol/L (ref 98–111)
Creatinine, Ser: 1 mg/dL (ref 0.44–1.00)
GFR, Estimated: >60 mL/min (ref >60)
Glucose, Bld: 82 mg/dL (ref 70–99)
Potassium: 4 mmol/L (ref 3.5–5.1)
Sodium: 139 mmol/L (ref 135–145)
Total Bilirubin: 0.8 mg/dL (ref 0.0–1.2)
Total Protein: 7.8 g/dL (ref 6.5–8.1)

## 2023-09-09 LAB — URINALYSIS, ROUTINE W REFLEX MICROSCOPIC
Bilirubin Urine: NEGATIVE
Glucose, UA: NEGATIVE mg/dL
Hgb urine dipstick: NEGATIVE
Ketones, ur: NEGATIVE mg/dL
Leukocytes,Ua: NEGATIVE
Nitrite: NEGATIVE
Protein, ur: NEGATIVE mg/dL
Specific Gravity, Urine: 1.025 (ref 1.005–1.030)
pH: 7 (ref 5.0–8.0)

## 2023-09-09 LAB — RAPID URINE DRUG SCREEN, HOSP PERFORMED
Amphetamines: NOT DETECTED
Barbiturates: NOT DETECTED
Benzodiazepines: NOT DETECTED
Cocaine: NOT DETECTED
Opiates: NOT DETECTED
Tetrahydrocannabinol: NOT DETECTED

## 2023-09-09 LAB — ETHANOL: Alcohol, Ethyl (B): <10 mg/dL (ref <10)

## 2023-09-09 NOTE — ED Provider Notes (Signed)
 [place] EMERGENCY DEPARTMENT AT [facility] Provider Note   CSN: [id number] Arrival date & time: 09/09/23  1037     History  Chief Complaint  Patient presents with   Vaginal Bleeding    Valerie Howard is a 48 y.o. female.   Vaginal Bleeding Patient presents today to the ER for chronic concerns of vaginal bleeding.  Also is expressing vague right eye floaters.  Previous medical history of schizoaffective disorder, paranoia, homelessness.  Patient does not want to provide further history as she states you are the physician and should know what to do from what I have told you.  Patient also expresses that since she  was a black woman coming and talking to me she would be defined as angry.  Patient very uncooperative. Endorses mild headache  Denies eye pain, blurry vision  Home Medications Prior to Admission medications   Medication Sig Start Date End Date Taking? Authorizing Provider  acetaminophen  (TYLENOL ) 500 MG tablet Take 1 tablet (500 mg total) by mouth every 6 (six) hours as needed. 10/01/22   Nivia Colon, PA-C  benzonatate  (TESSALON ) 100 MG capsule Take 1 capsule (100 mg total) by mouth 3 (three) times daily as needed for cough. Patient not taking: Reported on 02/21/2023 10/01/22   Nivia Colon, PA-C  naproxen  (NAPROSYN ) 500 MG tablet Take 1 tablet (500 mg total) by mouth 2 (two) times daily. 06/12/23   Ula Prentice SAUNDERS, MD      Allergies    Penicillins    Review of Systems   Review of Systems  Eyes:  Positive for visual disturbance.  Genitourinary:  Positive for vaginal bleeding.  All other systems reviewed and are negative.   Physical Exam Updated Vital Signs BP 138/74 (BP Location: Right Arm)   Pulse 89   Temp 97.9 F (36.6 C) (Oral)   Resp 16   Ht 5' 7 (1.702 m)   Wt 85.7 kg   LMP 09/08/2023   SpO2 99%   BMI 29.60 kg/m  Physical Exam Vitals and nursing note reviewed.  Constitutional:      Appearance: Normal appearance.  HENT:     Head:  Normocephalic and atraumatic.     Mouth/Throat:     Mouth: Mucous membranes are moist.     Pharynx: Oropharynx is clear.  Eyes:     General: No scleral icterus.       Right eye: No discharge.        Left eye: No discharge.     Extraocular Movements: Extraocular movements intact.     Conjunctiva/sclera: Conjunctivae normal.     Comments: Right eye is PERRL. 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  Cardiovascular:     Rate and Rhythm: Normal rate and regular rhythm.     Pulses: Normal pulses.  Pulmonary:     Effort: Pulmonary effort is normal. No respiratory distress.  Abdominal:     General: Abdomen is flat.     Palpations: Abdomen is soft.     Tenderness: There is no abdominal tenderness.  Skin:    General: Skin is warm and dry.  Neurological:     General: No focal deficit present.     Mental Status: She is alert. Mental status is at baseline.  Psychiatric:        Mood and Affect: Mood normal.     ED Results / Procedures / Treatments   Labs (all labs ordered are listed, but only abnormal results are displayed) Labs Reviewed  CBC WITH DIFFERENTIAL/PLATELET -  Abnormal; Notable for the following components:      Result Value   Hemoglobin 11.9 (*)    All other components within normal limits  URINALYSIS, ROUTINE W REFLEX MICROSCOPIC - Abnormal; Notable for the following components:   APPearance HAZY (*)    All other components within normal limits  COMPREHENSIVE METABOLIC PANEL  ETHANOL  RAPID URINE DRUG SCREEN, HOSP PERFORMED    EKG None  Radiology No results found.  Procedures Procedures    Medications Ordered in ED Medications - No data to display  ED Course/ Medical Decision Making/ A&P                                 Medical Decision Making Amount and/or Complexity of Data Reviewed Labs: ordered.   This patient is a 48 year old female who presents to the ED for concern of vaginal bleeding and right eye floater.   Differential diagnoses prior to  evaluation: Malingering, vitreous floaters, retinal detachment, tumor, endometriosis,, fibroids  Past Medical History / Social History / Additional history: Chart reviewed. Pertinent results include: Seen several times in the ED for similar complaints of vaginal symptoms and has been very uncooperative for the past.  Currently seen by [facility].  Medications / Treatment: No medications were provided due to being unable to ascertain any emergent pathology requiring treatment at this time.  ED Course:  Patient is a 48 year old female [place] the ED today complaining of vague symptoms of right eye floater and vaginal bleeding.  She was seen multiple times this month for similar symptoms.  Upon getting HPI, patient became very combative and refusing to answer questions.  She also refused to have any vaginal exam or further questioning of her vaginal bleeding.  She began being very upset and said that she would not talk to me unless she had attorney present.  Unknown what could have been provoking her as the conversation prior was simply asking about her floaters and for her to describe them.  She states that she is being seen by [facility].  Denies pain, vision loss.  States that she does have mild headache but that it is chronic in nature and is due to her being unable to see out of her left eye as it is started around that period of time.  Left eye concerns were addressed by [facility] previously which she says is diagnosed as glaucoma.  Patient's labs were unremarkable.  Patient's vital signs were also reassuring.  Patient is homeless and appears to be very combative and uncooperative.  I believe this patient is safe to be discharged and follow-up outpatient as she is being unwilling to be managed further.  Patient is willing to be discharged.   Disposition: After consideration of the diagnostic results and the patients response to treatment, I feel that patient benefit from discharge and treatment  noted as above.   emergency department workup does not suggest an emergent condition requiring admission or immediate intervention beyond what has been performed at this time. The plan is: Follow-up with eye doctor, return for new or worsening symptoms. The patient is safe for discharge and has been instructed to return immediately for worsening symptoms, change in symptoms or any other concerns.   Final Clinical Impression(s) / ED Diagnoses Final diagnoses:  Vitreous floaters of right eye  Vaginal bleeding    Rx / DC Orders ED Discharge Orders     None  Beola Terrall RAMAN, PA-C 09/09/23 1342    Charlyn Sora, MD 09/09/23 1623

## 2023-09-09 NOTE — ED Triage Notes (Signed)
 Patient presents to ed with mulitple complaints states she is homeless and has had vaginal bleeding since Dec. She has a flight of ideas

## 2023-09-09 NOTE — Discharge Instructions (Addendum)
 You were seen today for right eye floater and vaginal bleeding.  At this time there are no emergent concerning symptoms.  Labs are also very reassuring.  I believe that you are safe enough to be discharged and follow-up with your eye doctor.  Contact them to see if you can get scheduled for a earlier appointment.  Return for eye pain, worsening vision.  It was a pleasure seeing you in the ER today.

## 2023-09-10 ENCOUNTER — Encounter (HOSPITAL_COMMUNITY): Payer: Self-pay | Admitting: Emergency Medicine

## 2023-09-10 ENCOUNTER — Emergency Department (HOSPITAL_COMMUNITY)
Admission: EM | Admit: 2023-09-10 | Discharge: 2023-09-10 | Disposition: A | Discharge status: Home / Self Care | Payer: MEDICAID | Attending: Emergency Medicine | Admitting: Emergency Medicine

## 2023-09-10 ENCOUNTER — Other Ambulatory Visit: Payer: Self-pay

## 2023-09-10 DIAGNOSIS — N939 Abnormal uterine and vaginal bleeding, unspecified: Secondary | ICD-10-CM | POA: Insufficient documentation

## 2023-09-10 LAB — CBC
HCT: 37.9 % (ref 36.0–46.0)
Hemoglobin: 11.6 g/dL — ABNORMAL LOW (ref 12.0–15.0)
MCH: 27.6 pg (ref 26.0–34.0)
MCHC: 30.6 g/dL (ref 30.0–36.0)
MCV: 90 fL (ref 80.0–100.0)
Platelets: 303 10*3/uL (ref 150–400)
RBC: 4.21 MIL/uL (ref 3.87–5.11)
RDW: 13.7 % (ref 11.5–15.5)
WBC: 5.7 10*3/uL (ref 4.0–10.5)
nRBC: 0 % (ref 0.0–0.2)

## 2023-09-10 LAB — HCG, SERUM, QUALITATIVE: Preg, Serum: NEGATIVE

## 2023-09-10 MED ORDER — MEGESTROL ACETATE 40 MG PO TABS
40.0000 mg | ORAL_TABLET | Freq: Every day | ORAL | Status: DC
  Administered 2023-09-10: 40 mg via ORAL
  Filled 2023-09-10: qty 1

## 2023-09-10 NOTE — ED Triage Notes (Signed)
 Pt reports irregular vaginal bleeding for month of December. States it is large amounts of bright red.

## 2023-09-10 NOTE — ED Provider Notes (Signed)
 [place] EMERGENCY DEPARTMENT AT [facility] Provider Note   CSN: [id number] Arrival date & time: 09/10/23  0731     History  Chief Complaint  Patient presents with   Homeless    Valerie Howard is a 48 y.o. female.  Patient complains of vaginal bleeding.  Patient reports she has been having abnormal periods for the past year.  Patient reports that she has had multiple evaluations for the same.  Patient reports she has been unable to follow-up with gynecology because she does not have transportation.  Patient reports she has been on Megace  in the past which helped.  Patient reports she is currently unhoused and has trouble getting to medical appointments.  Patient reports that she had a ultrasound in October.  The history is provided by the patient. No language interpreter was used.       Home Medications Prior to Admission medications   Medication Sig Start Date End Date Taking? Authorizing Provider  acetaminophen  (TYLENOL ) 500 MG tablet Take 1 tablet (500 mg total) by mouth every 6 (six) hours as needed. 10/01/22   Nivia Colon, PA-C  benzonatate  (TESSALON ) 100 MG capsule Take 1 capsule (100 mg total) by mouth 3 (three) times daily as needed for cough. Patient not taking: Reported on 02/21/2023 10/01/22   Nivia Colon, PA-C  naproxen  (NAPROSYN ) 500 MG tablet Take 1 tablet (500 mg total) by mouth 2 (two) times daily. 06/12/23   Ula Prentice SAUNDERS, MD      Allergies    Penicillins    Review of Systems   Review of Systems  Gastrointestinal:  Negative for abdominal pain.  Genitourinary:  Positive for vaginal bleeding.  All other systems reviewed and are negative.   Physical Exam Updated Vital Signs BP (!) 155/107   Pulse (!) 112   Temp 98.2 F (36.8 C)   Resp 16   Ht 5' 7 (1.702 m)   Wt 86 kg   LMP 09/08/2023   SpO2 99%   BMI 29.69 kg/m  Physical Exam Vitals and nursing note reviewed.  Constitutional:      Appearance: She is well-developed.  HENT:     Head:  Normocephalic.  Cardiovascular:     Rate and Rhythm: Normal rate.  Pulmonary:     Effort: Pulmonary effort is normal.  Abdominal:     General: Abdomen is flat. Bowel sounds are normal. There is no distension.  Musculoskeletal:        General: Normal range of motion.     Cervical back: Normal range of motion.  Skin:    General: Skin is warm.  Neurological:     General: No focal deficit present.     Mental Status: She is alert and oriented to person, place, and time.     ED Results / Procedures / Treatments   Labs (all labs ordered are listed, but only abnormal results are displayed) Labs Reviewed  CBC - Abnormal; Notable for the following components:      Result Value   Hemoglobin 11.6 (*)    All other components within normal limits  HCG, SERUM, QUALITATIVE    EKG None  Radiology No results found.  Procedures Procedures    Medications Ordered in ED Medications  megestrol  (MEGACE ) tablet 40 mg (40 mg Oral Given 09/10/23 9160)    ED Course/ Medical Decision Making/ A&P  Medical Decision Making Complains of abnormal vaginal bleeding for over a year.  Amount and/or Complexity of Data Reviewed Labs: ordered. Decision-making details documented in ED Course.    Details: Labs ordered reviewed and interpreted.  Hemoglobin is 11.6.  Risk Prescription drug management. Risk Details: Pt  is given a prescription for Megace .  She is advised to follow-up at [facility] health GYN for further evaluation.   Does not feel like she needs a pelvic exam.        Final Clinical Impression(s) / ED Diagnoses Final diagnoses:  Vaginal bleeding    Rx / DC Orders ED Discharge Orders          Ordered    megestrol  (MEGACE ) 40 MG tablet  2 times daily        Pending           An After Visit Summary was printed and given to the patient.    Flint Sonny POUR, PA-C 09/10/23 1257    Kingsley, Victoria K, DO 09/10/23 1428

## 2023-09-10 NOTE — Discharge Instructions (Addendum)
 Follow up with Gynecology.  Megestrol has been sent to the pharmacy for you.

## 2023-09-10 NOTE — ED Notes (Signed)
 Pt also endorses vision trouble where she has been seeing [place] for since June 2023. Endorses moving spot in right eye.

## 2023-09-21 ENCOUNTER — Other Ambulatory Visit: Payer: Self-pay

## 2023-09-21 ENCOUNTER — Other Ambulatory Visit (HOSPITAL_COMMUNITY): Payer: Self-pay

## 2023-09-21 ENCOUNTER — Emergency Department (HOSPITAL_COMMUNITY)
Admission: EM | Admit: 2023-09-21 | Discharge: 2023-09-21 | Disposition: A | Discharge status: Home / Self Care | Payer: MEDICAID | Attending: Emergency Medicine | Admitting: Emergency Medicine

## 2023-09-21 ENCOUNTER — Encounter (HOSPITAL_COMMUNITY): Payer: Self-pay

## 2023-09-21 DIAGNOSIS — R451 Restlessness and agitation: Secondary | ICD-10-CM | POA: Diagnosis not present

## 2023-09-21 DIAGNOSIS — N939 Abnormal uterine and vaginal bleeding, unspecified: Secondary | ICD-10-CM | POA: Diagnosis present

## 2023-09-21 LAB — CBC
HCT: 38.1 % (ref 36.0–46.0)
Hemoglobin: 11.7 g/dL — ABNORMAL LOW (ref 12.0–15.0)
MCH: 27.3 pg (ref 26.0–34.0)
MCHC: 30.7 g/dL (ref 30.0–36.0)
MCV: 88.8 fL (ref 80.0–100.0)
Platelets: 335 10*3/uL (ref 150–400)
RBC: 4.29 MIL/uL (ref 3.87–5.11)
RDW: 13.8 % (ref 11.5–15.5)
WBC: 5.9 10*3/uL (ref 4.0–10.5)
nRBC: 0 % (ref 0.0–0.2)

## 2023-09-21 LAB — WET PREP, GENITAL
Clue Cells Wet Prep HPF POC: NONE SEEN
Sperm: NONE SEEN
Trich, Wet Prep: NONE SEEN
WBC, Wet Prep HPF POC: <10 (ref <10)
Yeast Wet Prep HPF POC: NONE SEEN

## 2023-09-21 LAB — HCG, SERUM, QUALITATIVE: Preg, Serum: NEGATIVE

## 2023-09-21 MED ORDER — MEGESTROL ACETATE 40 MG PO TABS
40.0000 mg | ORAL_TABLET | Freq: Every day | ORAL | Status: DC
  Administered 2023-09-21: 40 mg via ORAL
  Filled 2023-09-21: qty 1

## 2023-09-21 MED ORDER — MEGESTROL ACETATE 40 MG PO TABS
40.0000 mg | ORAL_TABLET | Freq: Once | ORAL | Status: DC
  Filled 2023-09-21: qty 1

## 2023-09-21 MED ORDER — MEGESTROL ACETATE 40 MG PO TABS
40.0000 mg | ORAL_TABLET | Freq: Every day | ORAL | 0 refills | Status: DC
Start: 2023-09-21 — End: 2023-10-01
  Filled 2023-09-21: qty 30, 30d supply, fill #0

## 2023-09-21 NOTE — ED Triage Notes (Addendum)
Arrives from parking lot with recurrent vaginal bleeding since December.   Verbally aggressive. Pt says bleeding usually manifests after an "alkaline battery smelling discharge".   Pt says she is without hosing at the time and unable to follow up as directed.

## 2023-09-21 NOTE — ED Notes (Signed)
Pt given opportunity to ask questions, patient verbalized discharge instructions, patient has been given bus pass as requested at discharge.

## 2023-09-21 NOTE — Discharge Instructions (Signed)
You were seen in the ED today for concerns of vaginal bleeding. Your labs show your hemoglobin is stable. You were given a dose of Megace with a prescription for this sent to the [facility] to help you have access to this medication. You need to be seen by OBGYN for this issue for further treatment.

## 2023-09-21 NOTE — ED Provider Notes (Signed)
[place] EMERGENCY DEPARTMENT AT [facility] Provider Note   CSN: [id number] Arrival date & time: 09/21/23  0543     History Chief Complaint  Patient presents with   Vaginal Bleeding    Valerie Howard is a 48 y.o. female. Patient with history of abnormal uterine bleeding, schizoaffective disorder, and paranoia presents to the ED with concerns of vaginal. Patient has been seen for this concern extensively in the ED numerous times. She is currently homeless. Was seen a week ago and had prescription for Megace sent to pharmacy that she has not picked up or started taking. States that she feels a "gush" of blood when she coughs on occasion. Denies pelvic pain. Does have a discharge present with abnormal smell.   Vaginal Bleeding      Home Medications Prior to Admission medications   Medication Sig Start Date End Date Taking? Authorizing Provider  megestrol (MEGACE) 40 MG tablet Take 1 tablet (40 mg total) by mouth daily. 09/21/23  Yes Smitty Knudsen, PA-C  acetaminophen (TYLENOL) 500 MG tablet Take 1 tablet (500 mg total) by mouth every 6 (six) hours as needed. 10/01/22  Yes Fayrene Helper, PA-C      Allergies    Penicillins    Review of Systems   Review of Systems  Genitourinary:  Positive for vaginal bleeding.  All other systems reviewed and are negative.   Physical Exam Updated Vital Signs BP (!) 169/102   Pulse 94   Temp 98.2 F (36.8 C)   Resp 18   Ht 5\' 7"  (1.702 m)   Wt 90.7 kg   LMP  (LMP Unknown)   SpO2 99%   BMI 31.32 kg/m  Physical Exam Vitals and nursing note reviewed. Exam conducted with a chaperone present.  Constitutional:      General: She is not in acute distress.    Appearance: She is overweight.  HENT:     Head: Normocephalic and atraumatic.  Eyes:     Conjunctiva/sclera: Conjunctivae normal.  Cardiovascular:     Rate and Rhythm: Normal rate and regular rhythm.     Heart sounds: No murmur heard. Pulmonary:     Effort: Pulmonary  effort is normal. No respiratory distress.     Breath sounds: Normal breath sounds.  Abdominal:     Palpations: Abdomen is soft.     Tenderness: There is no abdominal tenderness.  Genitourinary:    General: Normal vulva.     Labia:        Right: No rash or tenderness.        Left: No rash or tenderness.      Vagina: Normal.     Cervix: Cervical bleeding present.     Comments: Blood present in vaginal vault from cervical os. No cervical dilation. No lesions to vaginal wall. Musculoskeletal:        General: No swelling.     Cervical back: Neck supple.  Skin:    General: Skin is warm and dry.     Capillary Refill: Capillary refill takes less than 2 seconds.  Neurological:     Mental Status: She is alert.  Psychiatric:        Mood and Affect: Mood normal.        Behavior: Behavior is uncooperative and agitated.     ED Results / Procedures / Treatments   Labs (all labs ordered are listed, but only abnormal results are displayed) Labs Reviewed  CBC - Abnormal; Notable for the following components:  Result Value   Hemoglobin 11.7 (*)    All other components within normal limits  WET PREP, GENITAL  HCG, SERUM, QUALITATIVE    EKG None  Radiology No results found.  Procedures Procedures    Medications Ordered in ED Medications  megestrol (MEGACE) tablet 40 mg (40 mg Oral Given 09/21/23 1011)    ED Course/ Medical Decision Making/ A&P                                 Medical Decision Making Risk Prescription drug management.   This patient presents to the ED for concern of vaginal bleeding. Differential diagnosis includes abnormal uterine bleeding, bacterial vaginosis, UTI, vaginal trauma   Lab Tests:  I Ordered, and personally interpreted labs.  The pertinent results include:  CBC shows stable hemoglobin, hcg negative, wet prep unremarkable   Medicines ordered and prescription drug management:  I ordered medication including Megace for AUB   Reevaluation of the patient after these medicines showed that the patient stayed the same I have reviewed the patients home medicines and have made adjustments as needed   Problem List / ED Course:  Patient presents to the ED with concerns of vaginal bleeding. She is currently homeless and has been seen in the ED numerous times for similar concerns. Was admitted in August 2022 due to vaginal bleeding and symptomatic anemia. No history of bleeding disorders and not on blood thinners. No history of vaginal trauma. Patient initially agitated given that this is not clearing up. Has not followed up with OBGYN as previously instructed. Has transportation issues getting seen elsewhere. Advised that since hemoglobin is stable, no indication for admission and she can follow up with OB. Patient initially did not want pelvic exam but was able to discuss reasons for this and she is agreeable. Pelvic exam is unremarkable. Bleeding seen from cervical os, making this bleeding uterine in nature. No other concerning findings. Will give a dose of megace here and placed prescription order to [facility] pharmacy. Patient discharged in stable condition.   Social Determinants of Health:  Housing instability   Final Clinical Impression(s) / ED Diagnoses Final diagnoses:  Abnormal uterine bleeding    Rx / DC Orders ED Discharge Orders          Ordered    megestrol (MEGACE) 40 MG tablet  Daily        09/21/23 0915              Smitty Knudsen, PA-C 09/21/23 1038    Maia Plan, MD 09/22/23 [phone]

## 2023-09-21 NOTE — ED Provider Triage Note (Signed)
Emergency Medicine Provider Triage Evaluation Note  Valerie Howard , a 48 y.o. female  was evaluated in triage.  Pt complains of vaginal bleeding, onset in December. Seen previously for same. Agitated and verbally aggressive with staff.  Review of Systems  Positive: As above Negative: As above  Physical Exam  BP (!) 169/102   Pulse 94   Temp 98.2 F (36.8 C)   Resp 18   Ht 5\' 7"  (1.702 m)   Wt 90.7 kg   LMP  (LMP Unknown)   SpO2 99%   BMI 31.32 kg/m  Gen:   Awake, no distress   Resp:  Normal effort  MSK:   Moves extremities without difficulty  Other:  Sitting upright in wheelchair; disheveled.  Medical Decision Making  Medically screening exam initiated at 5:58 AM.  Appropriate orders placed.  Valerie Howard was informed that the remainder of the evaluation will be completed by another provider, this initial triage assessment does not replace that evaluation, and the importance of remaining in the ED until their evaluation is complete.  Work up initiated. Clinically stable for transition to [facility] pending bed placement in acute ED.   Antony Madura, PA-C 09/21/23 [phone]

## 2023-10-01 ENCOUNTER — Emergency Department (HOSPITAL_COMMUNITY): Payer: MEDICAID

## 2023-10-01 ENCOUNTER — Encounter (HOSPITAL_COMMUNITY): Payer: Self-pay

## 2023-10-01 ENCOUNTER — Emergency Department (HOSPITAL_COMMUNITY)
Admission: EM | Admit: 2023-10-01 | Discharge: 2023-10-01 | Disposition: A | Discharge status: Home / Self Care | Payer: MEDICAID | Attending: Student | Admitting: Student

## 2023-10-01 ENCOUNTER — Other Ambulatory Visit: Payer: Self-pay

## 2023-10-01 DIAGNOSIS — N939 Abnormal uterine and vaginal bleeding, unspecified: Secondary | ICD-10-CM | POA: Diagnosis present

## 2023-10-01 LAB — URINALYSIS, ROUTINE W REFLEX MICROSCOPIC
Bacteria, UA: NONE SEEN
Bilirubin Urine: NEGATIVE
Glucose, UA: NEGATIVE mg/dL
Ketones, ur: NEGATIVE mg/dL
Leukocytes,Ua: NEGATIVE
Nitrite: NEGATIVE
Protein, ur: 30 mg/dL — AB
RBC / HPF: >50 RBC/hpf (ref 0–5)
Specific Gravity, Urine: 1.029 (ref 1.005–1.030)
pH: 5 (ref 5.0–8.0)

## 2023-10-01 LAB — BASIC METABOLIC PANEL
Anion gap: 8 (ref 5–15)
BUN: 16 mg/dL (ref 6–20)
CO2: 22 mmol/L (ref 22–32)
Calcium: 8.9 mg/dL (ref 8.9–10.3)
Chloride: 108 mmol/L (ref 98–111)
Creatinine, Ser: 0.82 mg/dL (ref 0.44–1.00)
GFR, Estimated: >60 mL/min (ref >60)
Glucose, Bld: 96 mg/dL (ref 70–99)
Potassium: 3.8 mmol/L (ref 3.5–5.1)
Sodium: 138 mmol/L (ref 135–145)

## 2023-10-01 LAB — CBC
HCT: 32.2 % — ABNORMAL LOW (ref 36.0–46.0)
Hemoglobin: 9.9 g/dL — ABNORMAL LOW (ref 12.0–15.0)
MCH: 27.1 pg (ref 26.0–34.0)
MCHC: 30.7 g/dL (ref 30.0–36.0)
MCV: 88.2 fL (ref 80.0–100.0)
Platelets: 362 10*3/uL (ref 150–400)
RBC: 3.65 MIL/uL — ABNORMAL LOW (ref 3.87–5.11)
RDW: 13.8 % (ref 11.5–15.5)
WBC: 7 10*3/uL (ref 4.0–10.5)
nRBC: 0 % (ref 0.0–0.2)

## 2023-10-01 MED ORDER — MEGESTROL ACETATE 40 MG PO TABS
40.0000 mg | ORAL_TABLET | Freq: Every day | ORAL | Status: DC
  Administered 2023-10-01: 40 mg via ORAL
  Filled 2023-10-01: qty 1

## 2023-10-01 MED ORDER — MEGESTROL ACETATE 40 MG PO TABS: 40.0000 mg | ORAL_TABLET | Freq: Every day | ORAL | 0 refills | Status: DC

## 2023-10-01 MED ORDER — ACETAMINOPHEN 325 MG PO TABS
325.0000 mg | ORAL_TABLET | Freq: Once | ORAL | Status: AC
  Administered 2023-10-01: 325 mg via ORAL

## 2023-10-01 MED ORDER — ACETAMINOPHEN 325 MG PO TABS
650.0000 mg | ORAL_TABLET | Freq: Once | ORAL | Status: DC
  Filled 2023-10-01: qty 2

## 2023-10-01 NOTE — ED Provider Notes (Signed)
[facility] AT [name] Provider Note   CSN: [id number] Arrival date & time: 10/01/23  1524     History  Chief Complaint  Patient presents with   Vaginal Bleeding    BEDIE DOMINEY is a 48 y.o. female.   Vaginal Bleeding  Patient is a 48 year old female presents the ED today complaining of vaginal bleeding.  She has been here multiple times for the same complaint.  Previous history of being combative and noncompliance, schizoaffective disorder bipolar type, menorrhagia, symptomatic anemia, abnormal uterine bleeding, homelessness.  Reports that when she coughs she feels a "gush of blood."  She also reports hematuria and bilateral flank pain.  States that she was previously taking Megace but was not effective and had stopped taking 3 days ago, but then stated that her bleeding has increased since 3 days ago.  Has not been to follow-up with OB due to transportation issues saying that her license has been suspended.   Endorses cough Denies fever, vertigo, increased blurry vision, headache, chest pain, abdominal pain, shortness of breath, nausea, dysuria, vaginal itching, vaginal discharge Home Medications Prior to Admission medications   Medication Sig Start Date End Date Taking? Authorizing Provider  acetaminophen (TYLENOL) 500 MG tablet Take 1 tablet (500 mg total) by mouth every 6 (six) hours as needed. 10/01/22   Fayrene Helper, PA-C  megestrol (MEGACE) 40 MG tablet Take 1 tablet (40 mg total) by mouth daily. 10/01/23   Lunette Stands, PA-C      Allergies    Penicillins    Review of Systems   Review of Systems  Genitourinary:  Positive for vaginal bleeding.  All other systems reviewed and are negative.   Physical Exam Updated Vital Signs BP (!) 162/98 (BP Location: Right Arm)   Pulse (!) 103   Temp 98.7 F (37.1 C) (Oral)   Resp 18   Ht 5\' 7"  (1.702 m)   Wt 90.7 kg   LMP  (LMP Unknown)   SpO2 100%   BMI 31.32 kg/m  Physical  Exam Vitals and nursing note reviewed.  Constitutional:      Appearance: Normal appearance.  HENT:     Head: Normocephalic and atraumatic.  Eyes:     Extraocular Movements: Extraocular movements intact.     Conjunctiva/sclera: Conjunctivae normal.  Cardiovascular:     Rate and Rhythm: Normal rate and regular rhythm.     Pulses: Normal pulses.     Heart sounds: Normal heart sounds. No murmur heard.    No friction rub. No gallop.  Pulmonary:     Effort: Pulmonary effort is normal. No respiratory distress.     Breath sounds: Normal breath sounds.  Abdominal:     General: Abdomen is flat.     Palpations: Abdomen is soft.     Tenderness: There is no abdominal tenderness. There is right CVA tenderness and left CVA tenderness.  Musculoskeletal:     Cervical back: Normal range of motion and neck supple. No rigidity or tenderness.     Right lower leg: No edema.     Left lower leg: No edema.  Skin:    General: Skin is warm and dry.     Coloration: Skin is not jaundiced or pale.     Findings: No erythema.  Neurological:     General: No focal deficit present.     Mental Status: She is alert. Mental status is at baseline.     Motor: No weakness.  Psychiatric:  Mood and Affect: Mood normal.     ED Results / Procedures / Treatments   Labs (all labs ordered are listed, but only abnormal results are displayed) Labs Reviewed  CBC - Abnormal; Notable for the following components:      Result Value   RBC 3.65 (*)    Hemoglobin 9.9 (*)    HCT 32.2 (*)    All other components within normal limits  URINALYSIS, ROUTINE W REFLEX MICROSCOPIC - Abnormal; Notable for the following components:   APPearance TURBID (*)    Hgb urine dipstick LARGE (*)    Protein, ur 30 (*)    All other components within normal limits  BASIC METABOLIC PANEL    EKG None  Radiology CT Renal Stone Study Result Date: 10/01/2023 CLINICAL DATA:  Abdominal pain and flank pain. Evaluate for kidney stone.  Vaginal bleeding for 2 weeks. EXAM: CT ABDOMEN AND PELVIS WITHOUT CONTRAST TECHNIQUE: Multidetector CT imaging of the abdomen and pelvis was performed following the standard protocol without IV contrast. RADIATION DOSE REDUCTION: This exam was performed according to the departmental dose-optimization program which includes automated exposure control, adjustment of the mA and/or kV according to patient size and/or use of iterative reconstruction technique. COMPARISON:  Pelvic sonogram from 06/12/2023 FINDINGS: Lower chest: No pleural fluid or consolidative change. Hepatobiliary: Indeterminate lesion within segment 7 measures 4.2 x 2.4 cm and 39 Hounsfield units, image 20/2. Along the dome of liver there is a second lesion which is indeterminate measuring 3.4 by 2.8 cm and 34 Hounsfield units. Two stones identified within the gallbladder. The largest measures 3 cm. Phrygian cap is identified containing hyperdense material and 2 stones which measure up to 1.4 cm , image 19/2. The hyperdense material may reflect gallbladder sludge. No pericholecystic inflammation. No signs of bile duct dilatation. Pancreas: Unremarkable. No pancreatic ductal dilatation or surrounding inflammatory changes. Spleen: Normal in size without focal abnormality. Adrenals/Urinary Tract: Normal adrenal glands. No nephrolithiasis or hydronephrosis. No mass identified decompressed urinary bladder is suboptimally visualized. Stomach/Bowel: Stomach appears nondistended. The appendix is visualized and appears normal. There is no pathologic dilatation of the large or small bowel loops. No bowel wall thickening or inflammation identified. Large amount of desiccated stool is identified within the rectum, image 74/2. Vascular/Lymphatic: Normal appearance of the abdominal aorta. No abdominopelvic adenopathy. Reproductive: The uterus is diffusely enlarged containing scattered calcified fibroids. The uterus measures 14.9 x 3.4 by 15.6 cm (volume = 410 cm^3).  The left ovary is suboptimally visualized. Cyst within the right ovary measures 3.9 by 2.4 by 3.0 cm. Other: No ascites.  No discrete fluid collections identified. Musculoskeletal: No acute or significant osseous findings. IMPRESSION: 1. No evidence for nephrolithiasis or hydronephrosis. 2. Cholelithiasis without signs of acute cholecystitis. 3. Indeterminate liver lesions. Recommend further evaluation with nonemergent, outpatient MRI of the abdomen with and without contrast. 4. Enlarged fibroid uterus. 5. This study is not diagnostic for the evaluation of vaginal bleeding. The study scratch set the initial study of choice for abnormal vaginal bleeding would be a pelvic sonogram. If inconclusive due to presence of multiple fibroids then a contrast enhanced MRI of the pelvis would be advised. 6. Right ovarian cyst measures 3.9 cm. No follow-up imaging recommended. 7. Large amount of desiccated stool is identified within the rectum. Correlate for any clinical signs or symptoms of constipation. Electronically Signed   By: Signa Kell M.D.   On: 10/01/2023 20:23    Procedures Procedures    Medications Ordered in ED Medications  megestrol (MEGACE) tablet 40 mg (40 mg Oral Given 10/01/23 2028)  acetaminophen (TYLENOL) tablet 325 mg (325 mg Oral Given 10/01/23 1840)    ED Course/ Medical Decision Making/ A&P                                 Medical Decision Making Amount and/or Complexity of Data Reviewed Labs: ordered.   This patient is a 48 year old female who presents to the ED for concern of vaginal bleeding.   Differential diagnoses prior to evaluation: The emergent differential diagnosis includes, but is not limited to, tumor, vascular lesion, endometriosis, uterine fibroids, miscarriage, adenomyosis, trauma, hormonal imbalance. This is not an exhaustive differential.   Past Medical History / Co-morbidities / Social History: combative and noncompliance, schizoaffective disorder bipolar type,  menorrhagia, symptomatic anemia, abnormal uterine bleeding, homelessness.  Additional history: Chart reviewed. Pertinent results include:   Previously seen on 09/21/2023 for same issue and prescribed Megace and follow-up with OB GYN as instructed.  Last pelvic ultrasound was on 06/12/2023 which showed underlying enlarged uterus with multiple fibroids with endometrial distortion.   Lab Tests/Imaging studies: I personally interpreted labs/imaging and the pertinent results include:   BMP unremarkable CBC shows anemia with a hemoglobin 9.9 UA was notable for large hemoglobin on UA.  CT renal study showed Large stool burden in rectum, enlarged fibroids of the uterus, right ovarian cyst 3.9 cm. I agree with the radiologist interpretation.   Medications: I ordered medication including Megace.  I have reviewed the patients home medicines and have made adjustments as needed.   ED Course:   Patient is a 48 year old female presents ED today complaining of vaginal bleeding.  She has been seen multiple times for this complaint over the course of the last several years.  States that the bleeding comes and "gushes when coughing".  Previously on Megace after being seen on 09/21/23.  Patient was provided confusing answers but medications seem to have helped a little bit.  Also endorses bilateral flank pain.  Denies any urinary symptoms.  Previous ultrasound was done on 06/11/2024 which showed an enlarged uterus with multiple fibroids and endometrial distortion.   Physical exam was notable for bilateral mild CVA tenderness.  Patient requested not to undergo any vaginal exam.  Patient was noted to have normal heart rate during physical exam. Due to mild CVA tenderness, notably with reported hematuria and hemoglobin on dipstick, CT abdomen was done and showing Large stool burden in rectum, enlarged fibroids of the uterus, right ovarian cyst 3.9 cm.  Patient was provided Megace here.  However I believe this  uterine bleeding is needing to be further evaluated by OB/GYN.  Low suspicion for any emergent process at this time.  Patient is not having any symptoms of anemia or massive blood loss.  Will provide her with outpatient Megace and have her continue to fight and follow-up with OB/GYN for further evaluation.  Provided strict return to ED precautions.  I believe patient safe to discharge at this time     Disposition: After consideration of the diagnostic results and the patients response to treatment, I feel that the patient is safe to discharge at this time and followed up outpatient with OB/GYN.   emergency department workup does not suggest an emergent condition requiring admission or immediate intervention beyond what has been performed at this time. The plan is: Follow-up with OB/GYN, Megace to help control bleeding, return to the ED for  any new or worsening symptoms. The patient is safe for discharge and has been instructed to return immediately for worsening symptoms, change in symptoms or any other concerns.  Final Clinical Impression(s) / ED Diagnoses Final diagnoses:  Vaginal bleeding    Rx / DC Orders ED Discharge Orders          Ordered    megestrol (MEGACE) 40 MG tablet  Daily        10/01/23 1841              Lavonia Drafts 10/01/23 2044    Glendora Score, MD 10/02/23 1234

## 2023-10-01 NOTE — Discharge Instructions (Addendum)
He was seen today for abnormal uterine bleeding.  I have prescribed you Megace which should help with some of the bleeding however will not fix the problem unless you continue to follow-up with OB/GYN for further evaluation at this time.  Your imaging was also noted to have some liver abnormalities which should be followed up outpatient.  Recommend that you follow-up with the primary care to get scheduled for an outpatient imaging.  I provided the instructions on who to call and where to seek GYN assistance.  If you begin experiencing fast heart rate, weakness, dizziness, lightheadedness, shortness of breath, return to the ED for further evaluation

## 2023-10-01 NOTE — ED Triage Notes (Signed)
Vaginal bleeding x 2 weeks. Seen 1/23 for same. Verbally aggressive, will not answer all questions.

## 2023-10-11 ENCOUNTER — Emergency Department (HOSPITAL_COMMUNITY): Payer: MEDICAID

## 2023-10-11 ENCOUNTER — Observation Stay (HOSPITAL_COMMUNITY)
Admission: EM | Admit: 2023-10-11 | Discharge: 2023-10-13 | Disposition: A | Discharge status: Home / Self Care | Payer: MEDICAID | Attending: Internal Medicine | Admitting: Internal Medicine

## 2023-10-11 ENCOUNTER — Encounter (HOSPITAL_COMMUNITY): Payer: Self-pay | Admitting: *Deleted

## 2023-10-11 ENCOUNTER — Other Ambulatory Visit: Payer: Self-pay

## 2023-10-11 DIAGNOSIS — D219 Benign neoplasm of connective and other soft tissue, unspecified: Secondary | ICD-10-CM

## 2023-10-11 DIAGNOSIS — R5381 Other malaise: Secondary | ICD-10-CM

## 2023-10-11 DIAGNOSIS — N939 Abnormal uterine and vaginal bleeding, unspecified: Secondary | ICD-10-CM

## 2023-10-11 DIAGNOSIS — F22 Delusional disorders: Secondary | ICD-10-CM | POA: Insufficient documentation

## 2023-10-11 DIAGNOSIS — D509 Iron deficiency anemia, unspecified: Secondary | ICD-10-CM | POA: Diagnosis not present

## 2023-10-11 DIAGNOSIS — N920 Excessive and frequent menstruation with regular cycle: Secondary | ICD-10-CM | POA: Diagnosis not present

## 2023-10-11 DIAGNOSIS — F250 Schizoaffective disorder, bipolar type: Secondary | ICD-10-CM | POA: Diagnosis present

## 2023-10-11 DIAGNOSIS — R42 Dizziness and giddiness: Secondary | ICD-10-CM | POA: Diagnosis not present

## 2023-10-11 LAB — BASIC METABOLIC PANEL
Anion gap: 7 (ref 5–15)
BUN: 12 mg/dL (ref 6–20)
CO2: 23 mmol/L (ref 22–32)
Calcium: 9 mg/dL (ref 8.9–10.3)
Chloride: 108 mmol/L (ref 98–111)
Creatinine, Ser: 0.74 mg/dL (ref 0.44–1.00)
GFR, Estimated: >60 mL/min (ref >60)
Glucose, Bld: 105 mg/dL — ABNORMAL HIGH (ref 70–99)
Potassium: 4.3 mmol/L (ref 3.5–5.1)
Sodium: 138 mmol/L (ref 135–145)

## 2023-10-11 LAB — FERRITIN: Ferritin: 5 ng/mL — ABNORMAL LOW (ref 11–307)

## 2023-10-11 LAB — CBC WITH DIFFERENTIAL/PLATELET
Abs Immature Granulocytes: 0.01 10*3/uL (ref 0.00–0.07)
Basophils Absolute: 0 10*3/uL (ref 0.0–0.1)
Basophils Relative: 1 %
Eosinophils Absolute: 0.1 10*3/uL (ref 0.0–0.5)
Eosinophils Relative: 1 %
HCT: 25.9 % — ABNORMAL LOW (ref 36.0–46.0)
Hemoglobin: 7.3 g/dL — ABNORMAL LOW (ref 12.0–15.0)
Immature Granulocytes: 0 %
Lymphocytes Relative: 36 %
Lymphs Abs: 2.4 10*3/uL (ref 0.7–4.0)
MCH: 25.4 pg — ABNORMAL LOW (ref 26.0–34.0)
MCHC: 28.2 g/dL — ABNORMAL LOW (ref 30.0–36.0)
MCV: 90.2 fL (ref 80.0–100.0)
Monocytes Absolute: 0.3 10*3/uL (ref 0.1–1.0)
Monocytes Relative: 4 %
Neutro Abs: 3.8 10*3/uL (ref 1.7–7.7)
Neutrophils Relative %: 58 %
Platelets: 445 10*3/uL — ABNORMAL HIGH (ref 150–400)
RBC: 2.87 MIL/uL — ABNORMAL LOW (ref 3.87–5.11)
RDW: 15.4 % (ref 11.5–15.5)
WBC: 6.6 10*3/uL (ref 4.0–10.5)
nRBC: 0.3 % — ABNORMAL HIGH (ref 0.0–0.2)

## 2023-10-11 LAB — HEMOGLOBIN AND HEMATOCRIT, BLOOD
HCT: 23 % — ABNORMAL LOW (ref 36.0–46.0)
HCT: 25.9 % — ABNORMAL LOW (ref 36.0–46.0)
Hemoglobin: 6.8 g/dL — CL (ref 12.0–15.0)
Hemoglobin: 8.2 g/dL — ABNORMAL LOW (ref 12.0–15.0)

## 2023-10-11 LAB — HIV ANTIBODY (ROUTINE TESTING W REFLEX): HIV Screen 4th Generation wRfx: NONREACTIVE

## 2023-10-11 LAB — HCG, QUANTITATIVE, PREGNANCY: hCG, Beta Chain, Quant, S: <1 m[IU]/mL (ref <5)

## 2023-10-11 LAB — IRON AND TIBC
Iron: 16 ug/dL — ABNORMAL LOW (ref 28–170)
Saturation Ratios: 4 % — ABNORMAL LOW (ref 10.4–31.8)
TIBC: 391 ug/dL (ref 250–450)
UIBC: 375 ug/dL

## 2023-10-11 LAB — PREPARE RBC (CROSSMATCH)

## 2023-10-11 MED ORDER — ACETAMINOPHEN 325 MG PO TABS
650.0000 mg | ORAL_TABLET | Freq: Four times a day (QID) | ORAL | Status: DC | PRN
  Administered 2023-10-11 – 2023-10-12 (×2): 650 mg via ORAL
  Filled 2023-10-11 (×2): qty 2

## 2023-10-11 MED ORDER — ACETAMINOPHEN 500 MG PO TABS
1000.0000 mg | ORAL_TABLET | Freq: Once | ORAL | Status: AC
  Administered 2023-10-11: 1000 mg via ORAL
  Filled 2023-10-11: qty 2

## 2023-10-11 MED ORDER — SODIUM CHLORIDE 0.9% IV SOLUTION: Freq: Once | INTRAVENOUS | Status: DC

## 2023-10-11 MED ORDER — ACETAMINOPHEN 650 MG RE SUPP: 650.0000 mg | Freq: Four times a day (QID) | RECTAL | Status: DC | PRN

## 2023-10-11 NOTE — ED Triage Notes (Signed)
Pt arrives via [facility]. Per report, [place] [address] [place], unexplained vaginal bleeding and bilateral feet pain. Pt is homeless. 117cbg, 99% ra, hr 92, 174/102

## 2023-10-11 NOTE — ED Notes (Signed)
Pt. Provided w/ saltine crackers and ginger ale.

## 2023-10-11 NOTE — Progress Notes (Signed)
Pt admitted from ED this evening with foot pain. Alert , oriented and able to communicate needs but has some questionable behaviors.has a psych consult ordered. Declined to answer admission questions stating that they were awkward when asked about food security and housing, pt did voice that she had no place of residence.the only thing she seemed interested in was having a dinner as she repeatedly stated she wanted to take a shower
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
## 2023-10-11 NOTE — ED Notes (Signed)
Pt resting during blood transfusion. Denies any pain. Denies any needs. This nurse remains with pt during initial 15 mins of transfusion.

## 2023-10-11 NOTE — ED Notes (Signed)
Pt provided with sandwich and drink. Call bell in reach

## 2023-10-11 NOTE — ED Provider Triage Note (Signed)
Emergency Medicine Provider Triage Evaluation Note  Valerie Howard , a 48 y.o. female  was evaluated in triage.  Pt complains of vaginal bleeding for several months. Denies lightheadedness or SOB. Also report her feet are cold.   Review of Systems  Positive: Vaginal bleeding Negative: Abdominal pain  Physical Exam  BP (!) 146/91   Pulse 96   Temp 98.2 F (36.8 C) (Oral)   Resp 16   LMP 10/10/2023   SpO2 98%  Gen:   Awake, no distress   Resp:  Normal effort  MSK:   Moves extremities without difficulty  Other:   Medical Decision Making  Medically screening exam initiated at 12:37 AM.  Appropriate orders placed.  Valerie Howard was informed that the remainder of the evaluation will be completed by another provider, this initial triage assessment does not replace that evaluation, and the importance of remaining in the ED until their evaluation is complete.     Pete Pelt, [place] 10/11/23 [phone]

## 2023-10-11 NOTE — ED Provider Notes (Addendum)
[place] EMERGENCY DEPARTMENT AT [facility] Provider Note   CSN: [id number] Arrival date & time: 10/11/23  0023     History  Chief Complaint  Patient presents with   Foot Pain   HPI Valerie Howard is a 48 y.o. female with history of menorrhagia, uterine fibroids, iron deficient anemia and schizoaffective disorder presenting for vaginal bleeding and bilateral foot pain.  States vaginal bleeding has been intermittent since November.  States that it is worse with coughing.  States that seem to slow down in January but since her last period the bleeding has picked up somewhat.  She does state that she is somewhat short of breath when ambulating and feels dizzy at times when she walks.  The pain in her feet is mostly about her toes on both feet.  She states he is homeless and has been doing a good bit of walking at her toes "constantly hurt".  Denies any trauma to her feet.  Was here 10 days ago for vaginal bleeding and started on Megace and advised to follow-up with OB/GYN.  Patient states that she has not established care yet with OB/GYN for vaginal bleeding.  Denies abdominal pain and fever.   Foot Pain       Home Medications Prior to Admission medications   Medication Sig Start Date End Date Taking? Authorizing Provider  acetaminophen (TYLENOL) 500 MG tablet Take 1 tablet (500 mg total) by mouth every 6 (six) hours as needed. Patient taking differently: Take 500 mg by mouth every 6 (six) hours as needed for mild pain (pain score 1-3) or moderate pain (pain score 4-6). 10/01/22   Fayrene Helper, PA-C  megestrol (MEGACE) 40 MG tablet Take 1 tablet (40 mg total) by mouth daily. Patient not taking: Reported on 10/11/2023 10/01/23   Lunette Stands, PA-C      Allergies    Penicillins    Review of Systems   See HPI for pertinent positives  Physical Exam Updated Vital Signs BP 133/79   Pulse 87   Temp 98 F (36.7 C) (Oral)   Resp 18   LMP 10/10/2023   SpO2 100%  Physical  Exam Vitals and nursing note reviewed.  HENT:     Head: Normocephalic and atraumatic.     Mouth/Throat:     Mouth: Mucous membranes are moist.  Eyes:     General:        Right eye: No discharge.        Left eye: No discharge.     Conjunctiva/sclera: Conjunctivae normal.  Cardiovascular:     Rate and Rhythm: Normal rate and regular rhythm.     Pulses: Normal pulses.     Heart sounds: Normal heart sounds.  Pulmonary:     Effort: Pulmonary effort is normal.     Breath sounds: Normal breath sounds.  Abdominal:     General: Abdomen is flat.     Palpations: Abdomen is soft.  Skin:    General: Skin is warm and dry.  Neurological:     General: No focal deficit present.  Psychiatric:        Mood and Affect: Mood normal.     ED Results / Procedures / Treatments   Labs (all labs ordered are listed, but only abnormal results are displayed) Labs Reviewed  CBC WITH DIFFERENTIAL/PLATELET - Abnormal; Notable for the following components:      Result Value   RBC 2.87 (*)    Hemoglobin 7.3 (*)  HCT 25.9 (*)    MCH 25.4 (*)    MCHC 28.2 (*)    Platelets 445 (*)    nRBC 0.3 (*)    All other components within normal limits  BASIC METABOLIC PANEL - Abnormal; Notable for the following components:   Glucose, Bld 105 (*)    All other components within normal limits  HEMOGLOBIN AND HEMATOCRIT, BLOOD - Abnormal; Notable for the following components:   Hemoglobin 6.8 (*)    HCT 23.0 (*)    All other components within normal limits  HCG, QUANTITATIVE, PREGNANCY  TYPE AND SCREEN  PREPARE RBC (CROSSMATCH)    EKG None  Radiology US PELVIC COMPLETE WITH TRANSVAGINAL Result Date: 10/11/2023 CLINICAL DATA:  Episode of heavy vaginal bleeding EXAM: TRANSABDOMINAL AND TRANSVAGINAL ULTRASOUND OF PELVIS TECHNIQUE: Both transabdominal and transvaginal ultrasound examinations of the pelvis were performed. Transabdominal technique was performed for global imaging of the pelvis including uterus,  ovaries, adnexal regions, and pelvic cul-de-sac. It was necessary to proceed with endovaginal exam following the transabdominal exam to visualize the endometrium and ovaries. COMPARISON:  Abdominal CT from 10 days ago FINDINGS: Poorly defined uterus with extensive shadowing and heterogeneity in the deep pelvis. The uterus measures 13 x 12 x 9 cm by best estimation. One discrete fibroid is seen in the uterine body measuring up to 8.7 cm. By prior CT there are additional fibroids present. The ovaries could not be visualized. The endometrium cannot be visualized in the setting of abnormal uterine bleeding. No detected adnexal mass or pelvic fluid. IMPRESSION: Enlarged fibroid uterus with obscured endometrium and bilateral ovary. Recommend gynecology follow-up. Electronically Signed   By: Tiburcio Pea M.D.   On: 10/11/2023 04:16    Procedures .Critical Care  Performed by: Gareth Eagle, PA-C Authorized by: Gareth Eagle, PA-C   Critical care provider statement:    Critical care time (minutes):  30   Critical care was necessary to treat or prevent imminent or life-threatening deterioration of the following conditions: anemia with hemoglobin of 6.8 and symptoms associated with symptomatic anemia.  Transfused 1 unit of blood.   Critical care was time spent personally by me on the following activities:  Development of treatment plan with patient or surrogate, discussions with consultants, evaluation of patient's response to treatment, examination of patient, ordering and review of laboratory studies, ordering and review of radiographic studies, ordering and performing treatments and interventions, pulse oximetry, re-evaluation of patient's condition and review of old charts     Medications Ordered in ED Medications  0.9 %  sodium chloride infusion (Manually program via Guardrails IV Fluids) (has no administration in time range)  acetaminophen (TYLENOL) tablet 1,000 mg (1,000 mg Oral Given 10/11/23  0935)    ED Course/ Medical Decision Making/ A&P Clinical Course as of 10/11/23 1509  Wed Oct 11, 2023  0926 Calcium: 9.0 [JR]    Clinical Course User Index [JR] Gareth Eagle, PA-C                                 Medical Decision Making Amount and/or Complexity of Data Reviewed Labs: ordered. Decision-making details documented in ED Course.  Risk OTC drugs. Prescription drug management.   Initial Impression and Ddx DDx 48 year old well-appearing female presenting for bilateral foot pain and vaginal bleeding.  Exam was unremarkable.  Did note that her initial hemoglobin was 7.3. Ddx vaginal bleeding acute versus chronic, electrolyte derangement, symptomatic anemia,  other. Patient PMH that increases complexity of ED encounter:  history of menorrhagia, uterine fibroids, iron deficient anemia and schizoaffective disorder   Interpretation of Diagnostics - I independent reviewed and interpreted the labs as followed: Rechecked hemoglobin which was 6.8.   - I independently visualized the following imaging with scope of interpretation limited to determining acute life threatening conditions related to emergency care: pelvic US, which revealed enlarged fibroid uterus with obscured endometrium and bilateral ovaries   Patient Reassessment and Ultimate Disposition/Management On reassessment, patient remained stable. Dr. Donavan Foil of OB/GYN kindly agreed to see her in person.  Patient refused his service.  He did recommend that she would be at high risk to be lost to follow-up and could benefit from admission with plans for OB/GYN to consult.  He mentioned that she may benefit from embolization.  He also advised to double her Megace dose.  Admitted to hospital service with Dr Champ Mungo.  Patient management required discussion with the following services or consulting groups:  Hospitalist Service and OB/GYN  Complexity of Problems Addressed Acute complicated illness or Injury  Additional  Data Reviewed and Analyzed Further history obtained from: Past medical history and medications listed in the EMR and Prior ED visit notes  Patient Encounter Risk Assessment Consideration of hospitalization    Final Clinical Impression(s) / ED Diagnoses Final diagnoses:  Iron deficiency anemia, unspecified iron deficiency anemia type  Vaginal bleeding    Rx / DC Orders ED Discharge Orders     None         Gareth Eagle, PA-C 10/11/23 1520    Melene Plan, DO 10/11/23 1527

## 2023-10-11 NOTE — ED Triage Notes (Signed)
Pt says she walks around a lot and she is having pain in her feet. They feel better now that they are drying. She says she also has vaginal bleeding when she coughs.

## 2023-10-12 ENCOUNTER — Encounter (HOSPITAL_COMMUNITY): Payer: Self-pay | Admitting: Internal Medicine

## 2023-10-12 DIAGNOSIS — N921 Excessive and frequent menstruation with irregular cycle: Secondary | ICD-10-CM

## 2023-10-12 DIAGNOSIS — D509 Iron deficiency anemia, unspecified: Secondary | ICD-10-CM

## 2023-10-12 DIAGNOSIS — F250 Schizoaffective disorder, bipolar type: Secondary | ICD-10-CM

## 2023-10-12 DIAGNOSIS — E611 Iron deficiency: Secondary | ICD-10-CM

## 2023-10-12 LAB — COMPREHENSIVE METABOLIC PANEL
ALT: 10 U/L (ref 0–44)
AST: 15 U/L (ref 15–41)
Albumin: 3.4 g/dL — ABNORMAL LOW (ref 3.5–5.0)
Alkaline Phosphatase: 40 U/L (ref 38–126)
Anion gap: 17 — ABNORMAL HIGH (ref 5–15)
BUN: 13 mg/dL (ref 6–20)
CO2: 24 mmol/L (ref 22–32)
Calcium: 10.2 mg/dL (ref 8.9–10.3)
Chloride: 100 mmol/L (ref 98–111)
Creatinine, Ser: 0.98 mg/dL (ref 0.44–1.00)
GFR, Estimated: >60 mL/min (ref >60)
Glucose, Bld: 99 mg/dL (ref 70–99)
Potassium: 4.4 mmol/L (ref 3.5–5.1)
Sodium: 141 mmol/L (ref 135–145)
Total Bilirubin: 1 mg/dL (ref 0.0–1.2)
Total Protein: 6.6 g/dL (ref 6.5–8.1)

## 2023-10-12 LAB — CBC
HCT: 27.1 % — ABNORMAL LOW (ref 36.0–46.0)
Hemoglobin: 8.4 g/dL — ABNORMAL LOW (ref 12.0–15.0)
MCH: 26 pg (ref 26.0–34.0)
MCHC: 31 g/dL (ref 30.0–36.0)
MCV: 83.9 fL (ref 80.0–100.0)
Platelets: 385 10*3/uL (ref 150–400)
RBC: 3.23 MIL/uL — ABNORMAL LOW (ref 3.87–5.11)
RDW: 15.5 % (ref 11.5–15.5)
WBC: 7.1 10*3/uL (ref 4.0–10.5)
nRBC: 0 % (ref 0.0–0.2)

## 2023-10-12 MED ORDER — ONDANSETRON HCL 4 MG/2ML IJ SOLN
4.0000 mg | Freq: Once | INTRAMUSCULAR | Status: AC
Start: 2023-10-12 — End: 2023-10-12
  Administered 2023-10-12: 4 mg via INTRAVENOUS
  Filled 2023-10-12: qty 2

## 2023-10-12 MED ORDER — MEGESTROL ACETATE 40 MG PO TABS
80.0000 mg | ORAL_TABLET | Freq: Two times a day (BID) | ORAL | Status: DC
  Administered 2023-10-13: 80 mg via ORAL
  Filled 2023-10-12 (×3): qty 2

## 2023-10-12 MED ORDER — ONDANSETRON 4 MG PO TBDP: 4.0000 mg | ORAL_TABLET | Freq: Three times a day (TID) | ORAL | Status: DC | PRN

## 2023-10-12 MED ORDER — IRON SUCROSE 200 MG IVPB - SIMPLE MED
200.0000 mg | Freq: Once | Status: AC
  Administered 2023-10-12: 200 mg via INTRAVENOUS
  Filled 2023-10-12: qty 200

## 2023-10-12 NOTE — Consult Note (Signed)
[facility] Psychiatry New Face-to-Face Psychiatric Evaluation  Service Date: October 12, 2023 LOS:  LOS: 0 days   Assessment  Valerie Howard is a 48 y.o. female admitted medically for 10/11/2023 12:23 AM for heavy menstrual bleeding and anemia. Longstanding history of AUB, ultrasound imaging with fibroids.  She carries the psychiatric diagnosis of schizoaffective disorder and has a past medical history of vaginal bleeding, fibroids, menorrhagia, anemia requiring transfusion, homelessness . Consult / Liaison Psychiatry was consulted for concern for paranoid schizophrenia by Dr. Champ Mungo  from the internal medicine teaching service ([facility]).  Patient has a history of schizoaffective disorder. She is demonstrating persecutory delusions about the police department trying to hurt her.  She specifically states that she does not want to speak with psychiatry.  She does not want medications.  She specifically denies any SI, HI, AVH.  She did not appear to be responding to internal stimuli. She is noticeably irritable, but this is in the context of psychiatry referral, which she declines.  She does not appear to meet criteria for a manic episode at this time, and is able to contract for safety.  Review of nursing notes does not reflect any anger/hostility towards staff.  The patient interview was limited due to patient's irritability towards the psychiatry consult team in the context of her not desiring psychiatric treatment, stating "you can go."  Psychotropic medications: Current (prescribed prior to admission) None - patient non-adherent to psychotropic medications  Past haloperidol divalproex olanzapine ziprasidone risperidone lorazepam temazepam gabapentin trazodone  Diagnoses:  Active Hospital problems: Principal Problem:   Menorrhagia Active Problems:   Schizoaffective disorder, bipolar type (HCC)    Plan  ## Safety and Observation Level:  - Consult request was unrelated  to self-harm. Based on my clinical evaluation, I estimate the patient to be at low risk of self harm in the current setting. - At this time, we recommend a no additional level of observation. This decision is based on my review of the chart including patient's history and current presentation, interview of the patient, mental status examination, and consideration of suicide risk including evaluating suicidal ideation, plan, intent, suicidal or self-harm behaviors, risk factors, and protective factors. This judgment is based on our ability to directly address suicide risk, implement suicide prevention strategies and develop a safety plan while the patient is in the clinical setting. - Please contact our team if there is a concern that risk level has changed  ## Interventions (medications, psychoeducation, etc):  -Recommend an as needed agitation protocol agitation protocol: haloperidol 5 mg PO or IM PRN for agitation and aggression lorazepam 2 mg PO or IM PRN for agitation and aggression diphenhydramine 50 mg PO or IM PRN for agitation, aggression, or for acute dystonic reactions  Agitation and aggression, both of which may result in harm to self or others, is separate from refusal of care and hostility towards staff  ## Medical Decision Making Capacity:  - Not formally assessed during this encounter  ## Further Work-up: - most recent EKG on record in the chart from 01/25/2023  Today had QTc of 416 - pertinent labwork reviewed earlier this admission includes: CBC, chem profile, TSH, respiratory viral panel, and urinalysis, vitamin B12  ## Disposition:  - No indication for inpatient psychiatric admission. Recommending outpatient psychiatry / psychology follow-up. Defer immediate disposition plan to primary team. Although, patient declined outpatient services.  ## Behavioral / Environmental:  -- Standard delirium precautions and Fall precautions related to anemia  ##Legal Status --  Patient  voluntary -- Patient does not meet criteria for involuntary commitment or forced medications. She may refuse medications if she so chooses  Thank you for this consult request. Our recommendations are listed above.  today. She has delusions at baseline, and declines psychiatric medications. She denies visual and auditory hallucinations and did not appear to be responding to internal stimuli. She is not currently a harm self or others. She declined any psych follow-up, and does not require forced medication orders. . We will sign off. Please re-consult if any further questions.    Mariel Craft, MD  NEW history  Relevant Aspects of Hospital Course:  Admitted on 10/11/2023 for heavy vaginal bleeding.  Patient Report:  "I do not request to speak with psychiatry.  You can go now."  Patient did specifically deny any SI, HI, AVH.  She declines medication management or outpatient referral.  ROS Unable to obtain  Past Psychiatric History:  Previous psych diagnoses:  schizoaffective disorder Prior inpatient psychiatric treatment:  multiple prior inpatient psychiatric admissions to [facility]  dating back to 2011  Social History:  Children: patient has 4 children, 2 grandchildren  Substance Use History: Alcohol: unable to obtain Other illicit substances: unable to obtain, though past urine toxicologies have been negative for illicit substances  Family History:  Medical: unable to obtain Psych: unable to obtain Suicide: unable to obtain Substance use family hx: unable to obtain The patient's family history includes Breast cancer in her maternal grandmother; Cancer in an other family member; Diabetes in an other family member; Heart attack in an other family member; Hypertension in an other family member; Keloids in her mother.  Medical History: Past Medical History:  Diagnosis Date   History of radiation therapy 07/18/17-07/24/17   right ear, operative bed 12 Gy in 3 fractions   Keloid     right ear   Paranoid behavior (HCC)    Schizophrenia (HCC)     Surgical History: Past Surgical History:  Procedure Laterality Date   CESAREAN SECTION     KENALOG INJECTION Bilateral 07/17/2017   Procedure: KENALOG INJECTION;  Surgeon: Peggye Form, DO;  Location: [facility];  Service: Plastics;  Laterality: Bilateral;   MASS EXCISION N/A 07/17/2017   Procedure: EXCISION KELOID OF RIGHT EAR WITH INTROPERATIVE KENALOG INJECTION AND POST OP RADIATION;  Surgeon: Peggye Form, DO;  Location: [facility];  Service: Plastics;  Laterality: N/A;   TUBAL LIGATION      Medications:   Current Facility-Administered Medications:    0.9 %  sodium chloride infusion (Manually program via Guardrails IV Fluids), , Intravenous, Once, Gareth Eagle, PA-C, Held at 10/11/23 1605   acetaminophen (TYLENOL) tablet 650 mg, 650 mg, Oral, Q6H PRN, 650 mg at 10/11/23 2106 **OR** acetaminophen (TYLENOL) suppository 650 mg, 650 mg, Rectal, Q6H PRN, Champ Mungo, DO   iron sucrose (VENOFER) 200 mg in sodium chloride 0.9 % 100 mL IVPB, 200 mg, Intravenous, Once, Bender, Emily, DO   ondansetron (ZOFRAN-ODT) disintegrating tablet 4 mg, 4 mg, Oral, Q8H PRN, Faith Rogue, DO  Allergies: Allergies  Allergen Reactions   Penicillins Hives and Itching    Has patient had a PCN reaction causing immediate rash, facial/tongue/throat swelling, SOB or lightheadedness with hypotension:Patient refuses to answer (PRA) Has patient had a PCN reaction causing severe rash involving mucus membranes or skin necrosis:PRA Has patient had a PCN reaction that required hospitalization PRA Has patient had a PCN reaction occurring within the last 10 years:PRA If  all of the above answers are "NO", then may proceed with Cephalosporin use.     Objective  Vital signs:  Temp:  [98 F (36.7 C)-99.4 F (37.4 C)] 98.8 F (37.1 C) (02/13 0357) Pulse Rate:  [78-99] 87 (02/13 0912) Resp:  [17-18] 17  (02/13 0912) BP: (104-155)/(61-81) 140/77 (02/13 0912) SpO2:  [98 %-100 %] 100 % (02/13 0912)  Mental Status Exam: This patient interview was conducted in-person in the presence of the attending physician. I personally interviewed the patient by myself", and by the end of the interview, established poor rapport with the patient.  Basic Cognition: Elon Spanner is alert  Appearance and Grooming: Black female with documented age of 48 y.o. who presents congruently to her documented sex. The patient generally appears well and nourished. She has a/an obese habitus with an average build. Patient is laying in bed in a  laid back  posture; she appears as documented age.  Behavior: The patient appears in no acute distress, and during the interview, was agitated, as evidenced by refusing to speak with psychiatry team and telling us to leave . She made minimal eye contact.  The patient did not appear internally or externally preoccupied.  Attitude: Patient's attitude towards the interviewer was irritable, as evidenced by repeatedly interrupting this interviewer and refusing to let me speak, suspicious, as evidenced by expressing mistrust towards this interviewer, and hostile, as evidenced by telling this interviewer to leave the room .  Motor activity: The patient's movement speed was normal. Her gait was not observed during encounter. There was no notable abnormal facial movements and no notable abnormal extremity movements.  Speech: The patient's speech was clear and fluent. The volume of her speech was normal and normal in quantity. The rate was normal with a normal rhythm. Responses were normal in latency. There were no abnormal patterns in speech.  Mood: Dysthymic  Affect: Patient's affect is irritable and angry with broad range and even fluctuations. Her affect is inappropriate for the topic of conversation, as evidenced by refusal to answer simple questions regarding her care  .  ------------------------------------------------------------------------------------------------------------------------- Perception Unable to be assessed  Thought Content The patient describes persecutory delusions, specifically that the police department is trying to harm her .  Patient  terminated interview after denying SI and HI .  Thought Process The patient's thought process is linear and is goal-directed.  Insight The patient at the time of interview demonstrates poor insight, as evidenced by lacking understanding of mental health condition/s, inability to identify trigger/s causing mental health decompensation, and inability to identify adaptive and maladaptive coping strategies.  Judgement The patient over the past 24 hours demonstrates fair judgement, as evidenced by declining to start psychotropic medications, but compliant with medical care and no behavioral concerns from staff.  Expanded Cognitive Exam: A more comprehensive cognitive exam is not indicated at this time.  Assets  Assets: Desire for treatment and improvement in her medical condition  Sleep  Sleep:No data recorded  Physical Exam: General: well-appearing and in no acute distress HEENT: normocephalic and atraumatic Respiratory: normal respiratory effort and on RA Extremities: moving all extremities spontaneously  Blood pressure (!) 140/77, pulse 87, temperature 98.8 F (37.1 C), resp. rate 17, last menstrual period 10/10/2023, SpO2 100%. There is no height or weight on file to calculate BMI.

## 2023-10-12 NOTE — Plan of Care (Signed)

## 2023-10-12 NOTE — Plan of Care (Signed)
  Problem: Education: Goal: Knowledge of General Education information will improve Description: Including pain rating scale, medication(s)/side effects and non-pharmacologic comfort measures Outcome: Progressing   Problem: Clinical Measurements: Goal: Ability to maintain clinical measurements within normal limits will improve Outcome: Progressing Goal: Will remain free from infection Outcome: Progressing   Problem: Activity: Goal: Risk for activity intolerance will decrease Outcome: Progressing   Problem: Nutrition: Goal: Adequate nutrition will be maintained Outcome: Progressing   Problem: Coping: Goal: Level of anxiety will decrease Outcome: Progressing   Problem: Elimination: Goal: Will not experience complications related to bowel motility Outcome: Progressing   Pt had episode of vomiting, unable to assess emesis, pt flushed, instructed pt to keep in basin. Zofran given w/good results.

## 2023-10-12 NOTE — Progress Notes (Addendum)
   Subjective:  Valerie Howard "Deashia" is a 48 y.o. F with PMH of paranoid behavior, schizoaffective disorder, abnormal uterine bleeding who presented due to foot pain and abnormal uterine bleeding and was admitted for symptomatic anemia 2/2 menorrhagia.    Today, she reported a decrease in the vaginal bleeding this morning. She reported that she vomited overnight and has yet to vomit since the IV zofran was given. She repeated that she does not feel well this morning primarily due to nausea, but did not answer additional follow up questions regarding what exactly she meant by not feeling well. No diarrhea, chest pain, SOB. Patient reported constipation and dark stool when she uses oral iron.   Objective:  Vital signs in last 24 hours: Vitals:   10/11/23 1948 10/12/23 0032 10/12/23 0357 10/12/23 0912  BP: (!) 145/81 136/74 134/80 (!) 140/77  Pulse:  92 84 87  Resp: 17 17 18 17   Temp: 99.2 F (37.3 C) 99.4 F (37.4 C) 98.8 F (37.1 C)   TempSrc: Oral Oral    SpO2: 100% 100% 98% 100%   Physical Exam: General:NAD, laying in bed Cardiac:RRR, no murmur appreciated  Pulmonary:normal effort on room air, clear to auscultate bilaterally  Abdominal:soft, non tender  Neuro:awake, limited participation in conversation  Skin:warm and dry  Psych: restricted affect today      Latest Ref Rng & Units 10/12/2023    6:05 AM 10/11/2023    8:11 PM 10/11/2023    8:02 AM  CBC  WBC 4.0 - 10.5 K/uL 7.1     Hemoglobin 12.0 - 15.0 g/dL 8.4  8.2  6.8   Hematocrit 36.0 - 46.0 % 27.1  25.9  23.0   Platelets 150 - 400 K/uL 385          Latest Ref Rng & Units 10/12/2023    6:05 AM 10/11/2023    2:39 AM 10/01/2023    3:53 PM  BMP  Glucose 70 - 99 mg/dL 99  562  96   BUN 6 - 20 mg/dL 13  12  16    Creatinine 0.44 - 1.00 mg/dL 1.30  8.65  7.84   Sodium 135 - 145 mmol/L 141  138  138   Potassium 3.5 - 5.1 mmol/L 4.4  4.3  3.8   Chloride 98 - 111 mmol/L 100  108  108   CO2 22 - 32 mmol/L 24  23  22     Calcium 8.9 - 10.3 mg/dL 69.6  9.0  8.9      Assessment/Plan:  Principal Problem:   Menorrhagia Active Problems:   Schizoaffective disorder, bipolar type (HCC)  Menorrhagia Iron Deficiency Anemia   Likely secondary to known uterine fibroids. Hemoglobin is stable at 8.4 this morning s/p 1 unit pRBC. She is iron deficient with a saturation ratio of 4 and a ferritin of 5.  Plan: -IV iron today -OB/Gyn consulted for further evaluation and recommendations of menorrhagia    Paranoia History of schizophrenia vs schizoaffective disorder Documented separately on medical history and throughout chart but I am suspicious for paranoid schizophrenia. She remains restricted on exam today.  Plan: -Consult psychiatry, appreciate their recommendations    Resolved Problems:  __________________________________  Code Status: Full VTE Prophylaxis:SCD Diet:Regular  IVF:N/A Dispo: Anticipated discharge in approximately 1 day(s).   Faith Rogue, DO 10/12/2023, 11:03 AM Pager: [phone] After 5pm on weekdays and 1pm on weekends: On Call pager [phone]

## 2023-10-12 NOTE — H&P (Signed)
OBSTETRICS AND GYNECOLOGY ATTENDING CONSULT NOTE  Consult Date: 10/12/2023 Reason for Consult: AUB Consulting Provider: Dr. Hessie Diener    Assessment/Plan: - Continue transfusion given symptomatic anemia and hemoglobin of 6.8. 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 - Megace 80 mg BID initiated for bleeding for now.  - She and I discussed [place] but she was uninterested in it at this time.  - We discussed that she needs an endometrial biopsy to rule out cancer and hyperplasia.  - I offered to facilitate making an appointment with [place] but she declines. She would like to see [facility]. She would like to make the appointment herself.     Appreciate care of Valerie Howard by her primary team  Will sign off at this time. Please consult [place] again if any new questions.   Total consultation time including face-to-face time with patient (>50% of time), reviewing chart and documentation: 30 minutes  Milas Hock MD, FACOG Obstetrician & Gynecologist, [address] [facility] for [name], [facility] Consult Phone: [phone]    History of Present Illness: Valerie Howard is an 49 y.o. G3P3 female who was admitted for long standing history of AUB. She intermittently takes Megace but has primarily come in for transfusions. She is perimenopausal. She declined seeing GYN yesterday but accepts seeing [place] today.   Pertinent OB/GYN History: Patient's last menstrual period was 10/10/2023. 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   Schizoaffective disorder, bipolar type (HCC)    Menorrhagia with irregular cycle 05/12/2014   Personality disorder (HCC) 10/17/2012   Anemia, iron deficiency 10/13/2012   Tachycardia 10/01/2012    Past Medical History:  Diagnosis Date   History of radiation therapy 07/18/17-07/24/17   right ear, operative bed 12 Gy in 3 fractions   Keloid    right ear   Paranoid behavior (HCC)    Schizophrenia (HCC)     Past Surgical History:  Procedure Laterality Date   CESAREAN SECTION     KENALOG INJECTION Bilateral 07/17/2017   Procedure: KENALOG INJECTION;  Surgeon: Peggye Form, DO;  Location: [facility];  Service: Plastics;  Laterality: Bilateral;   MASS EXCISION N/A 07/17/2017   Procedure: EXCISION KELOID OF RIGHT EAR WITH INTROPERATIVE KENALOG INJECTION AND POST OP RADIATION;  Surgeon: Peggye Form, DO;  Location: [facility];  Service: Plastics;  Laterality: N/A;   TUBAL LIGATION      Family History  Problem Relation Age of Onset   Keloids Mother    Breast cancer Maternal Grandmother    Hypertension Other    Diabetes Other    Cancer Other  Heart attack Other     Social History:  reports that she has never smoked. She has never used smokeless tobacco. She reports current alcohol use. She reports that she does not use drugs.  Allergies:  Allergies  Allergen Reactions   Penicillins Hives and Itching    Has patient had a PCN reaction causing immediate rash, facial/tongue/throat swelling, SOB or lightheadedness with hypotension:Patient refuses to answer (PRA) Has patient had a PCN reaction causing severe rash involving mucus membranes or skin necrosis:PRA Has patient had a PCN reaction that required hospitalization PRA Has patient had a PCN reaction occurring within the last 10  years:PRA If all of the above answers are "NO", then may proceed with Cephalosporin use.     Medications: I have reviewed the patient's current medications.  Review of Systems: Pertinent items are noted in HPI.  Focused Physical Examination: BP (!) 116/55 (BP Location: Left Arm)   Pulse 99   Temp 98.9 F (37.2 C) (Oral)   Resp 17   LMP 10/10/2023   SpO2 100%  CONSTITUTIONAL: Well-developed, well-nourished female in no acute distress.  NEUROLOGIC: Alert and oriented to person, place, and time. Normal reflexes, muscle tone coordination. No cranial nerve deficit noted. CARDIOVASCULAR: Normal heart rate noted RESPIRATORY: Clear to auscultation bilaterally. Effort and breath sounds normal, no problems with respiration noted. ABDOMEN: Soft, normal bowel sounds, no distention noted.  No tenderness, rebound or guarding.  PELVIC: Not examined MUSCULOSKELETAL: Normal range of motion. No tenderness.  No cyanosis, clubbing, or edema.  2+ distal pulses.  Labs and Imaging: Results for orders placed or performed during the hospital encounter of 10/11/23 (from the past 72 hours)  CBC with Differential     Status: Abnormal   Collection Time: 10/11/23 12:47 AM  Result Value Ref Range   WBC 6.6 4.0 - 10.5 K/uL   RBC 2.87 (L) 3.87 - 5.11 MIL/uL   Hemoglobin 7.3 (L) 12.0 - 15.0 g/dL   HCT 16.1 (L) 09.6 - 04.5 %   MCV 90.2 80.0 - 100.0 fL   MCH 25.4 (L) 26.0 - 34.0 pg   MCHC 28.2 (L) 30.0 - 36.0 g/dL   RDW 40.9 81.1 - 91.4 %   Platelets 445 (H) 150 - 400 K/uL   nRBC 0.3 (H) 0.0 - 0.2 %   Neutrophils Relative % 58 %   Neutro Abs 3.8 1.7 - 7.7 K/uL   Lymphocytes Relative 36 %   Lymphs Abs 2.4 0.7 - 4.0 K/uL   Monocytes Relative 4 %   Monocytes Absolute 0.3 0.1 - 1.0 K/uL   Eosinophils Relative 1 %   Eosinophils Absolute 0.1 0.0 - 0.5 K/uL   Basophils Relative 1 %   Basophils Absolute 0.0 0.0 - 0.1 K/uL   Immature Granulocytes 0 %   Abs Immature Granulocytes 0.01 0.00 - 0.07 K/uL     Comment: Performed at [facility], [address] [place], [place] [address]  hCG, quantitative, pregnancy     Status: None   Collection Time: 10/11/23 12:47 AM  Result Value Ref Range   hCG, Beta Chain, Quant, S <1 <5 mIU/mL    Comment:          GEST. AGE      CONC.  (mIU/mL)   <=1 WEEK        5 - 50     2 WEEKS       50 - 500     3 WEEKS  100 - 10,000     4 WEEKS     1,000 - 30,000     5 WEEKS     3,500 - 115,000   6-8 WEEKS     12,000 - 270,000    12 WEEKS     15,000 - 220,000        FEMALE AND NON-PREGNANT FEMALE:     LESS THAN 5 mIU/mL Performed at [facility], [address] [place], [place] [address]   Basic metabolic panel     Status: Abnormal   Collection Time: 10/11/23  2:39 AM  Result Value Ref Range   Sodium 138 135 - 145 mmol/L   Potassium 4.3 3.5 - 5.1 mmol/L   Chloride 108 98 - 111 mmol/L   CO2 23 22 - 32 mmol/L   Glucose, Bld 105 (H) 70 - 99 mg/dL    Comment: Glucose reference range applies only to samples taken after fasting for at least 8 hours.   BUN 12 6 - 20 mg/dL   Creatinine, Ser 9.14 0.44 - 1.00 mg/dL   Calcium 9.0 8.9 - 78.2 mg/dL   GFR, Estimated >95 >62 mL/min    Comment: (NOTE) Calculated using the CKD-EPI Creatinine Equation (2021)    Anion gap 7 5 - 15    Comment: Performed at [facility], [address], [place], [place] [address]  Hemoglobin and hematocrit, blood     Status: Abnormal   Collection Time: 10/11/23  8:02 AM  Result Value Ref Range   Hemoglobin 6.8 (LL) 12.0 - 15.0 g/dL    Comment: REPEATED TO VERIFY THIS CRITICAL RESULT HAS VERIFIED AND BEEN CALLED TO JENNA BELCHER,RN BY ZELDA BEECH ON 02 12 2025 AT 0839, AND HAS BEEN READ BACK.     HCT 23.0 (L) 36.0 - 46.0 %    Comment: Performed at [facility], [address], [place], [place] [address]  Type and screen [facility]     Status: None (Preliminary result)   Collection Time: 10/11/23  8:54 AM  Result Value Ref Range    ABO/RH(D) O POS    Antibody Screen NEG    Sample Expiration 10/14/2023,2359    Unit Number [id number]    Blood Component Type RED CELLS,LR    Unit division 00    Status of Unit ISSUED,FINAL    Donor AG Type NEGATIVE FOR E ANTIGEN NEGATIVE FOR KIDD A ANTIGEN    Transfusion Status OK TO TRANSFUSE    Crossmatch Result COMPATIBLE    Unit Number [id number]    Blood Component Type RED CELLS,LR    Unit division 00    Status of Unit ALLOCATED    Donor AG Type NEGATIVE FOR E ANTIGEN NEGATIVE FOR KIDD A ANTIGEN    Transfusion Status OK TO TRANSFUSE    Crossmatch Result COMPATIBLE   Prepare RBC (crossmatch)     Status: None   Collection Time: 10/11/23  8:54 AM  Result Value Ref Range   Order Confirmation      ORDER PROCESSED BY BLOOD BANK Performed at [facility], [address] [place], [place] [address]   HIV Antibody (routine testing w rflx)     Status: None   Collection Time: 10/11/23  8:11 PM  Result Value Ref Range   HIV Screen 4th Generation wRfx Non Reactive Non Reactive    Comment: Performed at [facility], [address], [place], [place] [address]  Hemoglobin and  hematocrit, blood     Status: Abnormal   Collection Time: 10/11/23  8:11 PM  Result Value Ref Range   Hemoglobin 8.2 (L) 12.0 - 15.0 g/dL   HCT 82.9 (L) 56.2 - 13.0 %    Comment: Performed at [facility], [address], [place], [place] [address]  Iron and TIBC     Status: Abnormal   Collection Time: 10/11/23  8:11 PM  Result Value Ref Range   Iron 16 (L) 28 - 170 ug/dL   TIBC 469 629 - 528 ug/dL   Saturation Ratios 4 (L) 10.4 - 31.8 %   UIBC 375 ug/dL    Comment: Performed at [facility], [address], [place], [place] [address]  Ferritin     Status: Abnormal   Collection Time: 10/11/23  8:11 PM  Result Value Ref Range   Ferritin 5 (L) 11 - 307 ng/mL    Comment: Performed at [facility], [address], [place], [place] [address]  CBC     Status: Abnormal    Collection Time: 10/12/23  6:05 AM  Result Value Ref Range   WBC 7.1 4.0 - 10.5 K/uL   RBC 3.23 (L) 3.87 - 5.11 MIL/uL   Hemoglobin 8.4 (L) 12.0 - 15.0 g/dL   HCT 72.5 (L) 36.6 - 44.0 %   MCV 83.9 80.0 - 100.0 fL   MCH 26.0 26.0 - 34.0 pg   MCHC 31.0 30.0 - 36.0 g/dL   RDW 34.7 42.5 - 95.6 %   Platelets 385 150 - 400 K/uL   nRBC 0.0 0.0 - 0.2 %    Comment: Performed at [facility], [address], [place], [place] [address]  Comprehensive metabolic panel     Status: Abnormal   Collection Time: 10/12/23  6:05 AM  Result Value Ref Range   Sodium 141 135 - 145 mmol/L   Potassium 4.4 3.5 - 5.1 mmol/L   Chloride 100 98 - 111 mmol/L   CO2 24 22 - 32 mmol/L   Glucose, Bld 99 70 - 99 mg/dL    Comment: Glucose reference range applies only to samples taken after fasting for at least 8 hours.   BUN 13 6 - 20 mg/dL   Creatinine, Ser 4.33 0.44 - 1.00 mg/dL   Calcium 29.5 8.9 - 18.8 mg/dL   Total Protein 6.6 6.5 - 8.1 g/dL   Albumin 3.4 (L) 3.5 - 5.0 g/dL   AST 15 15 - 41 U/L   ALT 10 0 - 44 U/L   Alkaline Phosphatase 40 38 - 126 U/L   Total Bilirubin 1.0 0.0 - 1.2 mg/dL   GFR, Estimated >41 >66 mL/min    Comment: (NOTE) Calculated using the CKD-EPI Creatinine Equation (2021)    Anion gap 17 (H) 5 - 15    Comment: Performed at [facility], [address], [place], [place] [address]    US PELVIC COMPLETE WITH TRANSVAGINAL Result Date: 10/11/2023 CLINICAL DATA:  Episode of heavy vaginal bleeding EXAM: TRANSABDOMINAL AND TRANSVAGINAL ULTRASOUND OF PELVIS TECHNIQUE: Both transabdominal and transvaginal ultrasound examinations of the pelvis were performed. Transabdominal technique was performed for global imaging of the pelvis including uterus, ovaries, adnexal regions, and pelvic cul-de-sac. It was necessary to proceed with endovaginal exam following the transabdominal exam to visualize the endometrium and ovaries. COMPARISON:  Abdominal CT from 10 days ago FINDINGS: Poorly  defined uterus with extensive shadowing and heterogeneity in the deep pelvis. The  uterus measures 13 x 12 x 9 cm by best estimation. One discrete fibroid is seen in the uterine body measuring up to 8.7 cm. By prior CT there are additional fibroids present. The ovaries could not be visualized. The endometrium cannot be visualized in the setting of abnormal uterine bleeding. No detected adnexal mass or pelvic fluid. IMPRESSION: Enlarged fibroid uterus with obscured endometrium and bilateral ovary. Recommend gynecology follow-up. Electronically Signed   By: Tiburcio Pea M.D.   On: 10/11/2023 04:16

## 2023-10-13 ENCOUNTER — Other Ambulatory Visit (HOSPITAL_COMMUNITY): Payer: Self-pay

## 2023-10-13 DIAGNOSIS — R5381 Other malaise: Secondary | ICD-10-CM

## 2023-10-13 DIAGNOSIS — N921 Excessive and frequent menstruation with irregular cycle: Secondary | ICD-10-CM | POA: Diagnosis not present

## 2023-10-13 DIAGNOSIS — D509 Iron deficiency anemia, unspecified: Secondary | ICD-10-CM | POA: Diagnosis not present

## 2023-10-13 LAB — CBC
HCT: 27.8 % — ABNORMAL LOW (ref 36.0–46.0)
Hemoglobin: 8.4 g/dL — ABNORMAL LOW (ref 12.0–15.0)
MCH: 25.2 pg — ABNORMAL LOW (ref 26.0–34.0)
MCHC: 30.2 g/dL (ref 30.0–36.0)
MCV: 83.5 fL (ref 80.0–100.0)
Platelets: 440 10*3/uL — ABNORMAL HIGH (ref 150–400)
RBC: 3.33 MIL/uL — ABNORMAL LOW (ref 3.87–5.11)
RDW: 15.4 % (ref 11.5–15.5)
WBC: 7.3 10*3/uL (ref 4.0–10.5)
nRBC: 0.3 % — ABNORMAL HIGH (ref 0.0–0.2)

## 2023-10-13 LAB — BASIC METABOLIC PANEL
Anion gap: 8 (ref 5–15)
BUN: 12 mg/dL (ref 6–20)
CO2: 25 mmol/L (ref 22–32)
Calcium: 8.7 mg/dL — ABNORMAL LOW (ref 8.9–10.3)
Chloride: 102 mmol/L (ref 98–111)
Creatinine, Ser: 0.96 mg/dL (ref 0.44–1.00)
GFR, Estimated: >60 mL/min (ref >60)
Glucose, Bld: 93 mg/dL (ref 70–99)
Potassium: 3.6 mmol/L (ref 3.5–5.1)
Sodium: 135 mmol/L (ref 135–145)

## 2023-10-13 MED ORDER — IRON SUCROSE 200 MG IVPB - SIMPLE MED
200.0000 mg | Freq: Once | Status: AC
  Administered 2023-10-13: 200 mg via INTRAVENOUS
  Filled 2023-10-13: qty 110

## 2023-10-13 MED ORDER — MEGESTROL ACETATE 40 MG PO TABS
80.0000 mg | ORAL_TABLET | Freq: Two times a day (BID) | ORAL | 1 refills | Status: DC
  Filled 2023-10-13: qty 105, 26d supply, fill #0

## 2023-10-13 MED ORDER — LACTATED RINGERS IV BOLUS
1000.0000 mL | Freq: Once | INTRAVENOUS | Status: AC
  Administered 2023-10-13: 1000 mL via INTRAVENOUS

## 2023-10-13 NOTE — Progress Notes (Signed)
DISCHARGE NOTE HOME Sunda[facility] L Fifield to be discharged Home per MD order. Discussed prescriptions and follow up appointments with the patient. Prescriptions given to patient; medication list explained in detail. Patient verbalized understanding.  Skin clean, dry and intact without evidence of skin break down, no evidence of skin tears noted. IV catheter discontinued intact. Site without signs and symptoms of complications. Dressing and pressure applied. Pt denies pain at the site currently. No complaints noted.  Patient free of lines, drains, and wounds.   An After Visit Summary (AVS) was printed and given to the patient. Taken to discharge lounge Patient escorted via wheelchair, and discharged home via cab.  Velia Meyer, RN

## 2023-10-13 NOTE — Evaluation (Signed)
Physical Therapy Evaluation & Discharge Patient Details Name: Valerie Howard MRN: [id number] DOB: 15-Jun-1976 Today's Date: 10/13/2023  History of Present Illness  Pt is a 48 y.o. female who presented 10/11/23 with bil feet pain and vaginal bleeding. Admitted for symptomatic anemia 2/2 menorrhagia. PMH: menorrhagia, uterine fibroids, iron deficient anemia and schizoaffective disorder   Clinical Impression  Pt presents with condition above. PTA, she was independent without AD and homeless. Currently, the pt demonstrates appropriate and WFL strength and balance. She is able to quickly and safely perform bed mobility, transfers, and gait bouts without UE support, LOB, or assistance. However, she is limited in endurance by her symptoms of getting dizzy, dry mouth, and shaky the longer she stands (up to ~2-3 min at a time). See vitals below. Notified MD. Attempted to educate pt on benefit of rollator for energy conservation and providing her a place to sit as needed in the community as she is homeless but pt declining it and stating "I can't talk to you without my lawyer present". No skilled PT needs identified as pt is independent without any deficits other than endurance due to symptoms above. All education completed and questions answered. PT will sign off and defer further mobility to nursing staff and mobility techs as the pt is expected to improve as her symptoms are managed medically and she continues to mobilize.   BP: 160/100 (117) supine 178/89 (109) sitting 156/110 (124) standing  HR up to 139 bpm  SpO2 100%      If plan is discharge home, recommend the following: Direct supervision/assist for medications management;Direct supervision/assist for financial management;Assist for transportation;Supervision due to cognitive status   Can travel by private vehicle        Equipment Recommendations Rollator (4 wheels) (for energy conservation, but pt declining it)  Recommendations for  Other Services       Functional Status Assessment Patient has had a recent decline in their functional status and demonstrates the ability to make significant improvements in function in a reasonable and predictable amount of time.     Precautions / Restrictions Precautions Precautions: Other (comment) Recall of Precautions/Restrictions: Impaired Precaution/Restrictions Comments: watch BP Restrictions Weight Bearing Restrictions Per Provider Order: No      Mobility  Bed Mobility Overal bed mobility: Modified Independent             General bed mobility comments: HOB elevated, no assistance needed    Transfers Overall transfer level: Independent Equipment used: None               General transfer comment: Pt stands up from EOB quickly, declining grip socks, no LOB    Ambulation/Gait Ambulation/Gait assistance: Independent Gait Distance (Feet): 60 Feet Assistive device: None Gait Pattern/deviations: WFL(Within Functional Limits) Gait velocity: WFL Gait velocity interpretation: >4.37 ft/sec, indicative of normal walking speed   General Gait Details: Pt declined grip socks. Pt ambulates quickly in tight spaces, turning rapidly, no LOB. Pt declined hallway ambulation due to fatigue and shakiness the longer she stood.  Stairs            Wheelchair Mobility     Tilt Bed    Modified Rankin (Stroke Patients Only)       Balance Overall balance assessment: No apparent balance deficits (not formally assessed)  Pertinent Vitals/Pain Pain Assessment Pain Assessment: No/denies pain    Home Living Family/patient expects to be discharged to:: Shelter/Homeless                   Additional Comments: goes to a day shelter during the week    Prior Function Prior Level of Function : Independent/Modified Independent             Mobility Comments: no AD       Extremity/Trunk  Assessment   Upper Extremity Assessment Upper Extremity Assessment: Overall WFL for tasks assessed    Lower Extremity Assessment Lower Extremity Assessment: Overall WFL for tasks assessed (MMT scores of 5 grossly; contradicts herself whether she has numbness or not in her legs)    Cervical / Trunk Assessment Cervical / Trunk Assessment: Normal  Communication   Communication Communication: No apparent difficulties    Cognition Arousal: Alert Behavior During Therapy: Restless, Impulsive   PT - Cognitive impairments: History of cognitive impairments                       PT - Cognition Comments: Pt with hx of schizoaffective disorder. Pt can be contradictory in statements, stating she does then does not have numbness in her legs/feet. Pt also switches from being agreeable to being skeptical. She switches dicussion topics rapidly as well. Pt believes her symptoms are due to "something in the food", so a bit paranoid. When offering to recommend a rollator, pt stated "I can't talk to you without my lawyer present". Following commands: Intact       Cueing Cueing Techniques: Verbal cues     General Comments General comments (skin integrity, edema, etc.): BP 160/100 (117) supine, 178/89 (109) sitting, 156/110 (124) standing, HR up to 139 bpm, SpO2 100%; attempted to educate pt on use of rollator for sitting surface as needed but pt declining and stating "I can't talk to you without my lawyer present", thus ceased session    Exercises     Assessment/Plan    PT Assessment Patient does not need any further PT services  PT Problem List         PT Treatment Interventions      PT Goals (Current goals can be found in the Care Plan section)  Acute Rehab PT Goals Patient Stated Goal: did not state PT Goal Formulation: All assessment and education complete, DC therapy Time For Goal Achievement: 10/14/23 Potential to Achieve Goals: Good    Frequency       Co-evaluation                AM-PAC PT "6 Clicks" Mobility  Outcome Measure Help needed turning from your back to your side while in a flat bed without using bedrails?: None Help needed moving from lying on your back to sitting on the side of a flat bed without using bedrails?: None Help needed moving to and from a bed to a chair (including a wheelchair)?: None Help needed standing up from a chair using your arms (e.g., wheelchair or bedside chair)?: None Help needed to walk in hospital room?: None Help needed climbing 3-5 steps with a railing? : None 6 Click Score: 24    End of Session   Activity Tolerance: Patient tolerated treatment well Patient left: in bed;with call bell/phone within reach   PT Visit Diagnosis: Dizziness and giddiness (R42)    Time: 1478-2956 PT Time Calculation (min) (ACUTE ONLY): 26 min   Charges:  PT Evaluation $PT Eval Low Complexity: 1 Low PT Treatments $Therapeutic Activity: 8-22 mins PT General Charges $$ ACUTE PT VISIT: 1 Visit         Virgil [place], PT, DPT Acute Rehabilitation Services  Office: [phone]   Bettina Gavia 10/13/2023, 9:44 AM

## 2023-10-13 NOTE — Progress Notes (Addendum)
   10/13/23 1154  SDOH Interventions  Food Insecurity Interventions Patient Declined   CSW met with pt at bedside in response to SDOH screen and consult for pt being unhoused. CSW introduced self and role. CSW asked about the shelter pt was staying at, she said it is not a day shelter, CSW asked if she could go back at discharge. Pt stated she went through all of this at [facility], and what did CSW want from her. CSW explained that the assessment was in an effort to provide assistance.  Pt became agitated and stated that her ex father in law agitated her and for CSW to quit agitating her. She then dismissed CSW from room.   Bus pass and waiver provided to DC nurse for pt.

## 2023-10-13 NOTE — Progress Notes (Signed)
OT Cancellation Note  Patient Details Name: Valerie Howard MRN: [id number] DOB: Apr 26, 1976   Cancelled Treatment:    Reason Eval/Treat Not Completed: Patient declined, no reason specified- pt declined, reports just finishing with PT and not wanting to participate in OT at this time.  Will follow.   Barry Brunner, OT Acute Rehabilitation Services Office [phone]   Chancy Milroy 10/13/2023, 9:31 AM

## 2023-10-13 NOTE — Discharge Summary (Addendum)
Name: Valerie Howard MRN: [id number] DOB: 03-19-1976 48 y.o. PCP: Pcp, No  Date of Admission: 10/11/2023 12:23 AM Date of Discharge: 10/13/2023 2:07 PM Attending Physician: Dr. Sol Blazing  Discharge Diagnosis: Principal Problem:   Menorrhagia Active Problems:   Schizoaffective disorder, bipolar type [facility])    Discharge Medications: Allergies as of 10/13/2023       Reactions   Penicillins Hives, Itching   Has patient had a PCN reaction causing immediate rash, facial/tongue/throat swelling, SOB or lightheadedness with hypotension:Patient refuses to answer (PRA) Has patient had a PCN reaction causing severe rash involving mucus membranes or skin necrosis:PRA Has patient had a PCN reaction that required hospitalization PRA Has patient had a PCN reaction occurring within the last 10 years:PRA If all of the above answers are "NO", then may proceed with Cephalosporin use.        Medication List     TAKE these medications    acetaminophen 500 MG tablet Commonly known as: TYLENOL Take 1 tablet (500 mg total) by mouth every 6 (six) hours as needed.   megestrol 40 MG tablet Commonly known as: MEGACE Take 2 tablets (80 mg total) by mouth 2 (two) times daily. What changed:  how much to take when to take this        Disposition and follow-up:   Ms.Yanina L Dumont was discharged from [facility] in Stable condition.  At the hospital follow up visit please address:  1.  Follow-up:  *Menorrhagia *Iron deficiency anemia  -Please check CBC -Assess if patient has received outpatient IV iron  -Assess if patient has followed up with gynecology -Note that patient was started on Megace 80 mg twice daily  *Schizoaffective disorder *Paranoia -Patient has declined psychiatry medications or interventions at this moment -Please assess if patient is interested in outpatient psychiatry   *Dizziness -Please assess orthostatic vitals at follow-up, patient was not  orthostatic here -Assess if patient is using the rollator walker that we provided   2.  Labs / imaging needed at time of follow-up: CBC  3.  Pending labs/ test needing follow-up: N/A  4.  Medication Changes  STOPPED  -N/A   ADDED  -Megace 80 mg twice daily  -Tylenol prn    Follow-up Appointments:  Follow-up Information     [facility]. Schedule an appointment as soon as possible for a visit in 1 week(s).   Contact information: [address] [place] [address] [phone]                Hospital Course by problem list: Menorrhagia Iron Deficiency Anemia   Likely secondary to known uterine fibroids.  Patient's hemoglobin admission was 6.8 and she transfusion of 1 unit of PRBC.  Hemoglobin remained stable at 8.4 this morning s/p 1 unit pRBC. She is iron deficient with a saturation ratio of 4 and a ferritin of 5.  Gynecology evaluated the patient and recommended started Megace 80 mg twice daily.  Gynecology also offered uterine artery embolization, patient declined.  Patient was also offered follow-up with gynecology office, patient declined.  Gynecology also offered to speak with the preferred gynecology office of the patient, patient declined. Patient's bleeding much improved on the day of discharge.  She was discharged with 80 mg of Megace twice daily and with outpatient IV iron orders placed.   Paranoia Schizoaffective disorder Throughout hospitalization, patient remained paranoid.  Psychiatry was consulted to evaluate patient who did not recommend inpatient  psychiatry.  Patient declined psychiatric medications and follow-up with psychiatry.  Dizziness, limited endurance Patient reported limited endurance and dizziness after standing for a few minutes as well as after walking, patient's dizziness was present prior to hospitalization.  Orthostatic vital signs were unremarkable.  Physical therapy evaluated  the patient and patient was able to demonstrate appropriate strength and balance.  Physical therapy did recommend a rollator walker so that patient is able to take a seat while walking when she becomes dizzy.  We provided patient with 1L LR and this did not improve patient's symptoms.  I suspect that patient's dizziness is due to chronic iron deficiency anemia, physical deconditioning due to the anemia, or from a GI virus (patient vomited).    Discharge Subjective: Patient reports improvement in vaginal bleeding today. She denies additional vomiting or nausea today. When speaking about discharge, she was initially frustrated, noting difficulty walking due to dizziness that was present prior to being hospitalized. She would not provide any other symptoms or information about this dizziness. She reported that she doesn't have any family, patient has children (per patient on admission).   Discharge Exam:   Blood pressure (!) 166/88, pulse (!) 103, temperature 98.8 F (37.1 C), temperature source Oral, resp. rate 17, last menstrual period 10/10/2023, SpO2 99%.  Constitutional:Well-appearing, sitting in bed, in no acute distress Cardiovascular: Tachycardic after walking, regular rhythm, no m/r/g Pulmonary/Chest: normal work of breathing on room air Neurological: alert & awake, normal gait present, standing without instability  MSK: no gross abnormalities.  Skin: warm and dry Psych: Restricted affect   Pertinent Labs, Studies, and Procedures:     Latest Ref Rng & Units 10/13/2023    6:50 AM 10/12/2023    6:05 AM 10/11/2023    8:11 PM  CBC  WBC 4.0 - 10.5 K/uL 7.3  7.1    Hemoglobin 12.0 - 15.0 g/dL 8.4  8.4  8.2   Hematocrit 36.0 - 46.0 % 27.8  27.1  25.9   Platelets 150 - 400 K/uL 440  385         Latest Ref Rng & Units 10/13/2023    6:50 AM 10/12/2023    6:05 AM 10/11/2023    2:39 AM  CMP  Glucose 70 - 99 mg/dL 93  99  161   BUN 6 - 20 mg/dL 12  13  12    Creatinine 0.44 - 1.00 mg/dL 0.96   0.45  4.09   Sodium 135 - 145 mmol/L 135  141  138   Potassium 3.5 - 5.1 mmol/L 3.6  4.4  4.3   Chloride 98 - 111 mmol/L 102  100  108   CO2 22 - 32 mmol/L 25  24  23    Calcium 8.9 - 10.3 mg/dL 8.7  81.1  9.0   Total Protein 6.5 - 8.1 g/dL  6.6    Total Bilirubin 0.0 - 1.2 mg/dL  1.0    Alkaline Phos 38 - 126 U/L  40    AST 15 - 41 U/L  15    ALT 0 - 44 U/L  10      US PELVIC COMPLETE WITH TRANSVAGINAL Result Date: 10/11/2023 CLINICAL DATA:  Episode of heavy vaginal bleeding EXAM: TRANSABDOMINAL AND TRANSVAGINAL ULTRASOUND OF PELVIS TECHNIQUE: Both transabdominal and transvaginal ultrasound examinations of the pelvis were performed. Transabdominal technique was performed for global imaging of the pelvis including uterus, ovaries, adnexal regions, and pelvic cul-de-sac. It was necessary to proceed with endovaginal exam following the transabdominal exam  to visualize the endometrium and ovaries. COMPARISON:  Abdominal CT from 10 days ago FINDINGS: Poorly defined uterus with extensive shadowing and heterogeneity in the deep pelvis. The uterus measures 13 x 12 x 9 cm by best estimation. One discrete fibroid is seen in the uterine body measuring up to 8.7 cm. By prior CT there are additional fibroids present. The ovaries could not be visualized. The endometrium cannot be visualized in the setting of abnormal uterine bleeding. No detected adnexal mass or pelvic fluid. IMPRESSION: Enlarged fibroid uterus with obscured endometrium and bilateral ovary. Recommend gynecology follow-up. Electronically Signed   By: Tiburcio Pea M.D.   On: 10/11/2023 04:16     Discharge Instructions: Discharge Instructions     Amb Referral to Intravenous Iron Therapy   Complete by: As directed    You have been referred to [facility] Infusion team for IV Iron Infusions. The infusion pharmacy team will reach out to you with appointment information.    Primary Diagnosis Code for IV Iron: D50.0 - Iron deficiency Anemia  secondary to blood loss (Chronic)   Secondary diagnosis code for IV iron: Other   Comment: menorrhagia   Call MD for:  difficulty breathing, headache or visual disturbances   Complete by: As directed    Call MD for:  extreme fatigue   Complete by: As directed    Call MD for:  hives   Complete by: As directed    Call MD for:  persistant dizziness or light-headedness   Complete by: As directed    Call MD for:  persistant nausea and vomiting   Complete by: As directed    Call MD for:  redness, tenderness, or signs of infection (pain, swelling, redness, odor or green/yellow discharge around incision site)   Complete by: As directed    Call MD for:  severe uncontrolled pain   Complete by: As directed    Call MD for:  temperature >100.4   Complete by: As directed    Diet - low sodium heart healthy   Complete by: As directed    Discharge instructions   Complete by: As directed    You came to the hospital for vaginal bleeding .    *For your menorrhagia  -We have started you on these following medications:  -Megace 80 mg BID, please take two tablets every morning and evening  -You also have iron deficiency anemia and would benefit from IV iron outpatient, we will order this for you  -It is very important to FOLLOW UP with a gynecologist -It is also important to become established with a primary care provider   *Continue to increase hydration and try to eat three meals a day *If the dizziness worsens, return to the emergency department  *We will order a walker to sit down if you feel dizzy.   *Please consider following up with psychiatry   Follow-up appointments: Please visit a family doctor in 7 to 10 days   If you have any questions or concerns please feel free to call: Internal medicine clinic at [phone]   If you have any of these following symptoms, please call us or seek care at an emergency department: -Chest Pain -Difficulty Breathing -Syncope (passing  out) -Drooping of face -Slurred speech -Sudden weakness in your leg or arm -Fever -Chills   We are glad that you are feeling better, it was a pleasure to care for you!  Faith Rogue DO   Increase activity slowly   Complete by: As directed  Walker rolling   Complete by: As directed        Signed: Faith Rogue DO [facility] - PGY1 Pager: [phone] 10/13/2023, 2:07 PM    Please contact the on call pager after 5 pm and on weekends at [phone].

## 2023-10-13 NOTE — Progress Notes (Signed)
Went into patients room, pt was coming out of the bathroom and laid on the sofa and held her stomach, stating she was not feeling well, I offered to assist pt to bed and offered her Zofran, she declined and told me she would be ok, and to leave her alone. I left the room and received a call from the secretary that patient was vomiting, she had 2 emesis bags and it partially went on the floor,looked like undigested food, I then advised pt to take Zofran and she refused.

## 2023-10-13 NOTE — Plan of Care (Signed)

## 2023-10-13 NOTE — Discharge Instructions (Signed)
You came to the hospital for vaginal bleeding .    *For your menorrhagia  -We have started you on these following medications:  -Megace 80 mg BID, please take two tablets every morning and evening  -You also have iron deficiency anemia and would benefit from IV iron outpatient, we will order this for you  -It is very important to FOLLOW UP with a gynecologist -It is also important to become established with a primary care provider   *Continue to increase hydration and try to eat three meals a day *If the dizziness worsens, return to the emergency department  *We will order a walker to sit down if you feel dizzy.   *Please consider following up with psychiatry   Follow-up appointments: Please visit a family doctor in 7 to 10 days   If you have any questions or concerns please feel free to call: Internal medicine clinic at [phone]   If you have any of these following symptoms, please call us or seek care at an emergency department: -Chest Pain -Difficulty Breathing -Syncope (passing out) -Drooping of face -Slurred speech -Sudden weakness in your leg or arm -Fever -Chills   We are glad that you are feeling better, it was a pleasure to care for you!  Faith Rogue DO

## 2023-10-15 LAB — TYPE AND SCREEN
ABO/RH(D): O POS
Antibody Screen: NEGATIVE
Donor AG Type: NEGATIVE
Donor AG Type: NEGATIVE
Unit division: 0
Unit division: 0

## 2023-10-15 LAB — BPAM RBC
Blood Product Expiration Date: 202502252359
Blood Product Expiration Date: 202502252359
ISSUE DATE / TIME: 202502121150
Unit Type and Rh: 5100
Unit Type and Rh: 5100

## 2023-10-16 ENCOUNTER — Telehealth: Payer: Self-pay | Admitting: Internal Medicine

## 2023-10-16 ENCOUNTER — Telehealth: Payer: Self-pay | Admitting: Pharmacy Technician

## 2023-10-16 ENCOUNTER — Other Ambulatory Visit: Payer: Self-pay

## 2023-10-16 NOTE — Telephone Encounter (Signed)
Patient referred to infusion pharmacy team for ambulatory infusion of IV iron.  Insurance [facility] FOR MH/DD/SAS/ [employer] TAILORED PLAN  Dx code - D50.9 IV Iron Therapy - Venofer 200 mg IV x 5  Infusion appointments - Scheduling team will schedule patient as soon as possible.    Demetrius Charity, PharmD

## 2023-10-16 NOTE — Telephone Encounter (Signed)
Dr. Sol Blazing, [place] Childes note: Patient will be scheduled as soon as possible.  Auth Submission: NO AUTH NEEDED Site of care: Site of care: [facility] Payer: [employer] Medication & CPT/J Code(s) submitted: Venofer (Iron Sucrose) J1756 Route of submission (phone, fax, portal):  Phone # Fax # Auth type: Buy/Bill PB Units/visits requested: 5 DOSED Reference number:  Approval from: 10/16/23 to 03/13/24

## 2023-10-19 ENCOUNTER — Encounter: Payer: Self-pay | Admitting: Student

## 2023-10-20 ENCOUNTER — Emergency Department (HOSPITAL_COMMUNITY)
Admission: EM | Admit: 2023-10-20 | Discharge: 2023-10-20 | Disposition: A | Discharge status: Home / Self Care | Payer: MEDICAID | Attending: Emergency Medicine | Admitting: Emergency Medicine

## 2023-10-20 ENCOUNTER — Other Ambulatory Visit: Payer: Self-pay | Admitting: Student

## 2023-10-20 ENCOUNTER — Encounter (HOSPITAL_COMMUNITY): Payer: Self-pay

## 2023-10-20 ENCOUNTER — Other Ambulatory Visit: Payer: Self-pay

## 2023-10-20 DIAGNOSIS — X31XXXA Exposure to excessive natural cold, initial encounter: Secondary | ICD-10-CM | POA: Insufficient documentation

## 2023-10-20 DIAGNOSIS — Z5900 Homelessness unspecified: Secondary | ICD-10-CM | POA: Diagnosis not present

## 2023-10-20 DIAGNOSIS — T699XXA Effect of reduced temperature, unspecified, initial encounter: Secondary | ICD-10-CM | POA: Insufficient documentation

## 2023-10-20 NOTE — ED Provider Notes (Signed)
Emergency Department Provider Note   I have reviewed the triage vital signs and the nursing notes.   HISTORY  Chief Complaint Cold Exposure   HPI Valerie Howard is a 48 y.o. female with PMH reviewed below including homelessness presents to the ED with numbness in both feet after sleeping outside in the cold. She ultimately called EMS and since warming some has had return of feeling in the feet. No unilateral numbness/weakness. No arm/face symptoms.   Past Medical History:  Diagnosis Date   History of radiation therapy 07/18/17-07/24/17   right ear, operative bed 12 Gy in 3 fractions   Keloid    right ear   Paranoid behavior (HCC)    Schizophrenia (HCC)     Review of Systems  Constitutional: No fever/chills Cardiovascular: Denies chest pain. Respiratory: Denies shortness of breath. Gastrointestinal: No abdominal pain.  No nausea, no vomiting.   Skin: Negative for rash. Neurological: Negative for headaches. Numbness in both feet. Improving with warming.   ____________________________________________   PHYSICAL EXAM:  VITAL SIGNS: ED Triage Vitals  Encounter Vitals Group     BP 10/20/23 0604 (!) 124/94     Pulse Rate 10/20/23 0604 (!) 102     Resp 10/20/23 0604 18     Temp 10/20/23 0604 98.7 F (37.1 C)     Temp Source 10/20/23 0604 Oral     SpO2 10/20/23 0604 100 %     Weight 10/20/23 0601 198 lb 10.2 oz (90.1 kg)     Height 10/20/23 0601 5\' 7"  (1.702 m)   Constitutional: Alert and oriented. Well appearing and in no acute distress. Eyes: Conjunctivae are normal.  Head: Atraumatic. Nose: No congestion/rhinnorhea. Mouth/Throat: Mucous membranes are moist.   Neck: No stridor.   Cardiovascular: Normal rate, regular rhythm. Good peripheral circulation. 2+ DP pulses in the feet.  Respiratory: Normal respiratory effort. Gastrointestinal: No distention.  Musculoskeletal: No gross deformities of extremities. Neurologic:  Normal speech and language. No gross  focal neurologic deficits are appreciated.  Skin:  Skin is warm, dry and intact. No rash noted.   ____________________________________________   PROCEDURES  Procedure(s) performed:   Procedures  None  ____________________________________________   INITIAL IMPRESSION / ASSESSMENT AND PLAN / ED COURSE  Pertinent labs & imaging results that were available during my care of the patient were reviewed by me and considered in my medical decision making (see chart for details).   This patient is Presenting for Evaluation of foot numbness/cold exposure, which does require a range of treatment options, and is a complaint that involves a moderate risk of morbidity and mortality.  The Differential Diagnoses include cold exposure, frostbite, critical limb ischemia, CVA, etc.  Social Determinants of Health Risk the patient is currently un-housed.   Medical Decision Making: Summary:  Patient presents to the ED with tingling/numbness in both feet after sleeping out in the cold. Currently the overnight temps are in the low 20 F range. No visible frostbite. Normal pulses. Feet are cool but sensation improved with warming. Plan for continued warming and area shelter resources at discharge.    Patient's presentation is most consistent with acute presentation with potential threat to life or bodily function.   Disposition: discharge  ____________________________________________  FINAL CLINICAL IMPRESSION(S) / ED DIAGNOSES  Final diagnoses:  Cold exposure, initial encounter     Note:  This document was prepared using Dragon voice recognition software and may include unintentional dictation errors.  Alona Bene, MD, [facility] Emergency Medicine    Akin Yi,  Arlyss Repress, MD 10/20/23 [phone]

## 2023-10-20 NOTE — Progress Notes (Signed)
Unable to reach patient to schedule an appointment for IV iron infusions. If patient presents to the emergency department, please update her contact information ie phone number.

## 2023-10-30 ENCOUNTER — Other Ambulatory Visit (HOSPITAL_COMMUNITY): Payer: Self-pay

## 2024-04-27 ENCOUNTER — Other Ambulatory Visit (HOSPITAL_COMMUNITY): Payer: Self-pay

## 2024-04-27 ENCOUNTER — Emergency Department (HOSPITAL_COMMUNITY)
Admission: EM | Admit: 2024-04-27 | Discharge: 2024-04-27 | Disposition: A | Discharge status: Home / Self Care | Payer: MEDICAID | Attending: Emergency Medicine | Admitting: Emergency Medicine

## 2024-04-27 ENCOUNTER — Other Ambulatory Visit: Payer: Self-pay

## 2024-04-27 DIAGNOSIS — D649 Anemia, unspecified: Secondary | ICD-10-CM | POA: Insufficient documentation

## 2024-04-27 DIAGNOSIS — N939 Abnormal uterine and vaginal bleeding, unspecified: Secondary | ICD-10-CM | POA: Insufficient documentation

## 2024-04-27 DIAGNOSIS — Z5900 Homelessness unspecified: Secondary | ICD-10-CM | POA: Diagnosis not present

## 2024-04-27 LAB — CBC
HCT: 33.2 % — ABNORMAL LOW (ref 36.0–46.0)
Hemoglobin: 9.9 g/dL — ABNORMAL LOW (ref 12.0–15.0)
MCH: 23.7 pg — ABNORMAL LOW (ref 26.0–34.0)
MCHC: 29.8 g/dL — ABNORMAL LOW (ref 30.0–36.0)
MCV: 79.4 fL — ABNORMAL LOW (ref 80.0–100.0)
Platelets: 357 K/uL (ref 150–400)
RBC: 4.18 MIL/uL (ref 3.87–5.11)
RDW: 17.6 % — ABNORMAL HIGH (ref 11.5–15.5)
WBC: 4.8 K/uL (ref 4.0–10.5)
nRBC: 0 % (ref 0.0–0.2)

## 2024-04-27 LAB — LIPASE, BLOOD: Lipase: 25 U/L (ref 11–51)

## 2024-04-27 LAB — COMPREHENSIVE METABOLIC PANEL WITH GFR
ALT: 9 U/L (ref 0–44)
AST: 15 U/L (ref 15–41)
Albumin: 3.9 g/dL (ref 3.5–5.0)
Alkaline Phosphatase: 53 U/L (ref 38–126)
Anion gap: 11 (ref 5–15)
BUN: 12 mg/dL (ref 6–20)
CO2: 21 mmol/L — ABNORMAL LOW (ref 22–32)
Calcium: 9 mg/dL (ref 8.9–10.3)
Chloride: 106 mmol/L (ref 98–111)
Creatinine, Ser: 0.87 mg/dL (ref 0.44–1.00)
GFR, Estimated: >60 mL/min (ref >60)
Glucose, Bld: 90 mg/dL (ref 70–99)
Potassium: 4 mmol/L (ref 3.5–5.1)
Sodium: 138 mmol/L (ref 135–145)
Total Bilirubin: 0.8 mg/dL (ref 0.0–1.2)
Total Protein: 7.8 g/dL (ref 6.5–8.1)

## 2024-04-27 LAB — PREGNANCY, URINE: Preg Test, Ur: NEGATIVE

## 2024-04-27 MED ORDER — MEGESTROL ACETATE 40 MG PO TABS
80.0000 mg | ORAL_TABLET | Freq: Two times a day (BID) | ORAL | 0 refills | Status: DC
  Filled 2024-04-27: qty 120, 30d supply, fill #0

## 2024-04-27 NOTE — ED Triage Notes (Signed)
 Patient arrived with EMS from street  , reports pain across lower abdomen onset yesterday and vaginal bleeding since Friday , no emesis or diarrhea , CBG=87.

## 2024-04-27 NOTE — ED Notes (Signed)
Patient refused to put on gown

## 2024-04-27 NOTE — Discharge Instructions (Addendum)
 Please follow-up with OB/GYN and your primary care doctor.  We have refilled your Megace .

## 2024-04-27 NOTE — ED Provider Notes (Signed)
  EMERGENCY DEPARTMENT AT [facility] Provider Note  CSN: [id number] Arrival date & time: 04/27/24 9454  Chief Complaint(s) Abdominal Pain (Vaginal Bleeding)  HPI Valerie Howard is a 48 y.o. female history of schizophrenia, recurrent vaginal bleeding, fibroids presented to the emergency department with vaginal bleeding.  Patient reports she has chronic vaginal bleeding, reports that has become heavier recently.  Was previously on Megace  which she is no longer taking.  She has been seen for this previously, reports that she was referred to OB/GYN but has not been able to see them because of transportation issues.  No nausea, vomiting.  Reports some mild lower abdominal cramping.  No discharge.  No fevers or chills.  No urinary symptoms.  No chest pain or shortness of breath.  No fainting or syncope.   Past Medical History Past Medical History:  Diagnosis Date   History of radiation therapy 07/18/17-07/24/17   right ear, operative bed 12 Gy in 3 fractions   Keloid    right ear   Paranoid behavior (HCC)    Schizophrenia [facility])    Patient Active Problem List   Diagnosis Date Noted   Physical deconditioning 10/13/2023   Homelessness 09/09/2022   Abnormal uterine bleeding 09/08/2022   Menorrhagia 08/03/2022   Influenza A 08/03/2022   Fibroids 07/28/2022   Acute on chronic blood loss anemia 07/28/2022   Symptomatic anemia 03/18/2020   Paranoia (psychosis) (HCC)    Acute blood loss anemia 02/27/2019   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 Authorizing Provider  acetaminophen  (TYLENOL ) 500 MG tablet Take 1 tablet (500 mg total) by mouth every 6 (six) hours as needed. Patient taking  differently: Take 500 mg by mouth every 6 (six) hours as needed for mild pain (pain score 1-3) or moderate pain (pain score 4-6). 10/01/22   Nivia Colon, PA-C  megestrol  (MEGACE ) 40 MG tablet Take 2 tablets (80 mg total) by mouth 2 (two) times daily. 10/13/23   Kandis Perkins, DO                                                                                                                                    Past Surgical History Past Surgical History:  Procedure Laterality Date   CESAREAN SECTION     KENALOG  INJECTION Bilateral 07/17/2017   Procedure: KENALOG  INJECTION;  Surgeon: Lowery Estefana RAMAN, DO;  Location: [facility];  Service: Plastics;  Laterality: Bilateral;   MASS EXCISION N/A 07/17/2017   Procedure: EXCISION KELOID OF RIGHT EAR WITH INTROPERATIVE KENALOG  INJECTION AND POST OP RADIATION;  Surgeon: Lowery Estefana RAMAN, DO;  Location: [facility];  Service: Plastics;  Laterality:  N/A;   TUBAL LIGATION     Family History Family History  Problem Relation Age of Onset   Keloids Mother    Breast cancer Maternal Grandmother    Hypertension Other    Diabetes Other    Cancer Other    Heart attack Other     Social History Social History   Tobacco Use   Smoking status: Never   Smokeless tobacco: Never  Vaping Use   Vaping status: Never Used  Substance Use Topics   Alcohol  use: Yes    Comment: socially   Drug use: No   Allergies Penicillins  Review of Systems Review of Systems  All other systems reviewed and are negative.   Physical Exam Vital Signs  I have reviewed the triage vital signs BP 130/88 (BP Location: Right Arm)   Pulse 65   Temp (!) 97.5 F (36.4 C) (Oral)   Resp 14   SpO2 100%  Physical Exam Vitals and nursing note reviewed.  Constitutional:      General: She is not in acute distress.    Appearance: She is well-developed.  HENT:     Head: Normocephalic and atraumatic.     Mouth/Throat:     Mouth: Mucous membranes  are moist.  Eyes:     Pupils: Pupils are equal, round, and reactive to light.  Cardiovascular:     Rate and Rhythm: Normal rate and regular rhythm.     Heart sounds: No murmur heard. Pulmonary:     Effort: Pulmonary effort is normal. No respiratory distress.     Breath sounds: Normal breath sounds.  Abdominal:     General: Abdomen is flat.     Palpations: Abdomen is soft.     Tenderness: There is no abdominal tenderness.  Musculoskeletal:        General: No tenderness.     Right lower leg: No edema.     Left lower leg: No edema.  Skin:    General: Skin is warm and dry.  Neurological:     General: No focal deficit present.     Mental Status: She is alert. Mental status is at baseline.  Psychiatric:        Mood and Affect: Mood normal.        Behavior: Behavior normal.     Comments: odd thought process but not reacting to internal stimuli or frankly delusional     ED Results and Treatments Labs (all labs ordered are listed, but only abnormal results are displayed) Labs Reviewed  COMPREHENSIVE METABOLIC PANEL WITH GFR - Abnormal; Notable for the following components:      Result Value   CO2 21 (*)    All other components within normal limits  CBC - Abnormal; Notable for the following components:   Hemoglobin 9.9 (*)    HCT 33.2 (*)    MCV 79.4 (*)    MCH 23.7 (*)    MCHC 29.8 (*)    RDW 17.6 (*)    All other components within normal limits  LIPASE, BLOOD  PREGNANCY, URINE  Radiology No results found.  Pertinent labs & imaging results that were available during my care of the patient were reviewed by me and considered in my medical decision making (see MDM for details).  Medications Ordered in ED Medications - No data to display                                                                                                                                    Procedures Procedures  (including critical care time)  Medical Decision Making / ED Course   MDM:  48 year old presenting to the emergency department with vaginal bleeding.  This seems to be a chronic problem.  Patient is overall well-appearing.  There is no abdominal tenderness.  She has had prior ultrasound showing fibroid uterus.  Was previously on Megace  but has stopped this.  Hemoglobin today is mildly low but consistent with prior.  No indication for transfusion.  Patient is hemodynamically stable. was admitted earlier this year for transfusion and iron  infusion.  Her primary team tried to set her up for outpatient iron  infusions however she did not answer any calls.  She reports that her number is constantly changing.  I offered to update this in the chart and she said that it would not be possible.  She also said that she would not be willing to download the MyChart app and communicate with her team on this.  I recommended that she follow-up with OB/GYN and she said she will try but she has transportation issues.  Offered bus pass but she said she would not use it for this and would use it for other things.  Discussed that this probably will probably not improve until she sees OB/GYN.  Pending hCG.  If this is negative anticipate discharge.  Will refill patient's megace . Will discharge patient to home. All questions answered. Patient comfortable with plan of discharge. Return precautions discussed with patient and specified on the after visit summary.         Additional history obtained:  -External records from outside source obtained and reviewed including: Chart review including previous notes, labs, imaging, consultation notes including prior visits and labs    Lab Tests: -I ordered, reviewed, and interpreted labs.   The pertinent results include:   Labs Reviewed  COMPREHENSIVE METABOLIC PANEL WITH GFR - Abnormal; Notable for the following components:      Result  Value   CO2 21 (*)    All other components within normal limits  CBC - Abnormal; Notable for the following components:   Hemoglobin 9.9 (*)    HCT 33.2 (*)    MCV 79.4 (*)    MCH 23.7 (*)    MCHC 29.8 (*)    RDW 17.6 (*)    All other components within normal limits  LIPASE, BLOOD  PREGNANCY, URINE    Notable for chronic anemia    Medicines ordered and prescription drug management: No  orders of the defined types were placed in this encounter.   -I have reviewed the patients home medicines and have made adjustments as needed  Social Determinants of Health:  Diagnosis or treatment significantly limited by social determinants of health: homelessness   Reevaluation: After the interventions noted above, I reevaluated the patient and found that their symptoms have stayed the same  Co morbidities that complicate the patient evaluation  Past Medical History:  Diagnosis Date   History of radiation therapy 07/18/17-07/24/17   right ear, operative bed 12 Gy in 3 fractions   Keloid    right ear   Paranoid behavior (HCC)    Schizophrenia (HCC)       Dispostion: Disposition decision including need for hospitalization was considered, and patient discharged from emergency department.    Final Clinical Impression(s) / ED Diagnoses Final diagnoses:  Vaginal bleeding     This chart was dictated using voice recognition software.  Despite best efforts to proofread,  errors can occur which can change the documentation meaning.    Francesca Elsie CROME, MD 04/27/24 [phone]

## 2024-04-29 ENCOUNTER — Encounter (HOSPITAL_COMMUNITY): Payer: Self-pay | Admitting: Emergency Medicine

## 2024-04-29 ENCOUNTER — Emergency Department (HOSPITAL_COMMUNITY)
Admission: EM | Admit: 2024-04-29 | Discharge: 2024-04-29 | Disposition: A | Discharge status: Home / Self Care | Payer: MEDICAID | Attending: Emergency Medicine | Admitting: Emergency Medicine

## 2024-04-29 ENCOUNTER — Other Ambulatory Visit: Payer: Self-pay

## 2024-04-29 DIAGNOSIS — N939 Abnormal uterine and vaginal bleeding, unspecified: Secondary | ICD-10-CM | POA: Diagnosis present

## 2024-04-29 LAB — I-STAT CHEM 8, ED
BUN: 14 mg/dL (ref 6–20)
Calcium, Ion: 1.2 mmol/L (ref 1.15–1.40)
Chloride: 106 mmol/L (ref 98–111)
Creatinine, Ser: 0.9 mg/dL (ref 0.44–1.00)
Glucose, Bld: 98 mg/dL (ref 70–99)
HCT: 34 % — ABNORMAL LOW (ref 36.0–46.0)
Hemoglobin: 11.6 g/dL — ABNORMAL LOW (ref 12.0–15.0)
Potassium: 3.7 mmol/L (ref 3.5–5.1)
Sodium: 142 mmol/L (ref 135–145)
TCO2: 24 mmol/L (ref 22–32)

## 2024-04-29 MED ORDER — MEGESTROL ACETATE 40 MG PO TABS
80.0000 mg | ORAL_TABLET | Freq: Once | ORAL | Status: AC
  Administered 2024-04-29: 80 mg via ORAL
  Filled 2024-04-29: qty 2

## 2024-04-29 NOTE — ED Provider Notes (Signed)
 [place] EMERGENCY DEPARTMENT AT [facility] Provider Note   CSN: [id number] Arrival date & time: 04/29/24  9463     Patient presents with: Vaginal Bleeding   Valerie Howard is a 48 y.o. female.   The history is provided by the patient and medical records.  Vaginal Bleeding  48 year old female with history of anemia, uterine fibroids, personality disorder, schizoaffective disorder, longstanding history of dysfunctional uterine bleeding, presenting to the ED once again for vaginal bleeding.  She was just seen 2 days ago for same with reassuring workup.  Her Megace  was refilled at that visit but she has not yet started this.  She has also been encouraged to follow back up with OB/GYN as they were trying to set her up with outpatient iron  infusions, states she does not want to see them as she is unhoused and is unable to clean herself properly before an appointment.  Questioned about oral iron  to which she states I do not need that.  She denies any dizziness or syncopal events.  Prior to Admission medications   Medication Sig Start Date End Date Taking? Authorizing Provider  acetaminophen  (TYLENOL ) 500 MG tablet Take 1 tablet (500 mg total) by mouth every 6 (six) hours as needed. Patient taking differently: Take 500 mg by mouth every 6 (six) hours as needed for mild pain (pain score 1-3) or moderate pain (pain score 4-6). 10/01/22   Nivia Colon, PA-C  megestrol  (MEGACE ) 40 MG tablet Take 2 tablets (80 mg total) by mouth 2 (two) times daily. 04/27/24   Francesca Elsie LITTIE, MD    Allergies: Penicillins    Review of Systems  Genitourinary:  Positive for vaginal bleeding.  All other systems reviewed and are negative.   Updated Vital Signs BP (!) 123/95 (BP Location: Right Arm)   Pulse (!) 106   Temp 98.2 F (36.8 C) (Oral)   Resp 20   SpO2 100%   Physical Exam Vitals and nursing note reviewed.  Constitutional:      Appearance: She is well-developed.     Comments:  Disheveled appearing  HENT:     Head: Normocephalic and atraumatic.  Eyes:     Conjunctiva/sclera: Conjunctivae normal.     Pupils: Pupils are equal, round, and reactive to light.  Cardiovascular:     Rate and Rhythm: Normal rate and regular rhythm.     Heart sounds: Normal heart sounds.  Pulmonary:     Effort: Pulmonary effort is normal.     Breath sounds: Normal breath sounds.  Abdominal:     General: Bowel sounds are normal.     Palpations: Abdomen is soft.  Musculoskeletal:        General: Normal range of motion.     Cervical back: Normal range of motion.  Skin:    General: Skin is warm and dry.  Neurological:     Mental Status: She is alert and oriented to person, place, and time.     (all labs ordered are listed, but only abnormal results are displayed) Labs Reviewed  I-STAT CHEM 8, ED    EKG: None  Radiology: No results found.   Procedures   Medications Ordered in the ED - No data to display                                  Medical Decision Making Amount and/or Complexity of Data Reviewed Labs: ordered. ECG/medicine tests: ordered  and independent interpretation performed.  48 year old female presenting to the ED with vaginal bleeding.  Longstanding history of uterine fibroids and dysfunctional uterine bleeding, multiple prior ED visits for same.  Has not restarted her Megace , attempted to follow-up with OB/GYN, completed outpatient IV iron  infusions (did not answer prior calls from residency service about this), or attempted oral iron .    Patient is well-known to myself from prior visits.   Have already had extensive conversations with her about limited intervention through the ER.  It is imperative that she follow-up with OB/GYN for definitive management.  Will recheck hemoglobin today but if no acute findings or no need for emergent transfusion, anticipate discharge with OP follow-up.  6:22 AM Hemoglobin today is 11.6.  This is discussed with patient.   She remains hemodynamically stable.  I have offered to place formal referral to gynecology for her, she refused and stated she would call and set that up when she can.  She did request a dose of Megace  here before she leaves.  This has been ordered.  Again, stressed to patient importance of outpatient OB/GYN follow-up.  Can return here for new concerns.  Final diagnoses:  Vaginal bleeding    ED Discharge Orders     None          Jarold Olam HERO, PA-C 04/29/24 9375    Haze Lonni PARAS, MD 05/01/24 [phone]

## 2024-04-29 NOTE — Discharge Instructions (Signed)
 Limited interventions available from the ER for this.  It is IMPERATIVE that you follow-up with OB-GYN for definitive management. Pick up the megace  that was sent in for you 2 days ago and start taking it.  That should help control some of the bleeding.

## 2024-04-29 NOTE — ED Triage Notes (Signed)
 Pt arrived via EMS from outside, pt is currently unhoused. Pt c/o vaginal bleeding that has increased since yesterday. Pt reports weakness r/t nutritional issues. Pt reports taking no medications.

## 2024-06-14 ENCOUNTER — Emergency Department (HOSPITAL_COMMUNITY): Payer: MEDICAID

## 2024-06-14 ENCOUNTER — Encounter (HOSPITAL_COMMUNITY): Payer: Self-pay

## 2024-06-14 ENCOUNTER — Emergency Department (HOSPITAL_COMMUNITY)
Admission: EM | Admit: 2024-06-14 | Discharge: 2024-06-14 | Disposition: A | Discharge status: Home / Self Care | Payer: MEDICAID | Attending: Emergency Medicine | Admitting: Emergency Medicine

## 2024-06-14 ENCOUNTER — Other Ambulatory Visit: Payer: Self-pay

## 2024-06-14 DIAGNOSIS — M7989 Other specified soft tissue disorders: Secondary | ICD-10-CM | POA: Insufficient documentation

## 2024-06-14 DIAGNOSIS — M25562 Pain in left knee: Secondary | ICD-10-CM | POA: Diagnosis present

## 2024-06-14 DIAGNOSIS — Z5900 Homelessness unspecified: Secondary | ICD-10-CM | POA: Diagnosis not present

## 2024-06-14 LAB — CBC
HCT: 28.8 % — ABNORMAL LOW (ref 36.0–46.0)
Hemoglobin: 8 g/dL — ABNORMAL LOW (ref 12.0–15.0)
MCH: 20.4 pg — ABNORMAL LOW (ref 26.0–34.0)
MCHC: 27.8 g/dL — ABNORMAL LOW (ref 30.0–36.0)
MCV: 73.5 fL — ABNORMAL LOW (ref 80.0–100.0)
Platelets: 380 K/uL (ref 150–400)
RBC: 3.92 MIL/uL (ref 3.87–5.11)
RDW: 19.6 % — ABNORMAL HIGH (ref 11.5–15.5)
WBC: 4.1 K/uL (ref 4.0–10.5)
nRBC: 0 % (ref 0.0–0.2)

## 2024-06-14 LAB — BASIC METABOLIC PANEL WITH GFR
Anion gap: 8 (ref 5–15)
BUN: 10 mg/dL (ref 6–20)
CO2: 26 mmol/L (ref 22–32)
Calcium: 8.7 mg/dL — ABNORMAL LOW (ref 8.9–10.3)
Chloride: 105 mmol/L (ref 98–111)
Creatinine, Ser: 0.73 mg/dL (ref 0.44–1.00)
GFR, Estimated: >60 mL/min (ref >60)
Glucose, Bld: 98 mg/dL (ref 70–99)
Potassium: 3.9 mmol/L (ref 3.5–5.1)
Sodium: 139 mmol/L (ref 135–145)

## 2024-06-14 LAB — TROPONIN I (HIGH SENSITIVITY)
Troponin I (High Sensitivity): 3 ng/L (ref <18)
Troponin I (High Sensitivity): 3 ng/L (ref <18)

## 2024-06-14 LAB — HCG, SERUM, QUALITATIVE: Preg, Serum: NEGATIVE

## 2024-06-14 MED ORDER — ACETAMINOPHEN 325 MG PO TABS
650.0000 mg | ORAL_TABLET | Freq: Once | ORAL | Status: AC
  Administered 2024-06-14: 650 mg via ORAL
  Filled 2024-06-14: qty 2

## 2024-06-14 NOTE — ED Triage Notes (Signed)
 Pt. Having let knee pain started Wednesday.  Denies any injury.  Pt. Is homeless and does walk a lot.   Pt. Also reports feeling like her heart is beating fast.  Denies any chest pain.

## 2024-06-14 NOTE — ED Provider Triage Note (Signed)
 Emergency Medicine Provider Triage Evaluation Note  Valerie Howard , a 48 y.o. female  was evaluated in triage.  Pt complains of left knee pain, swelling.  She reports that she is homeless, spends a lot of time walking.  She also endorses some shortness of breath with walking.  After taking several steps she endorses having trouble catching her breath.  She denies any pleuritic chest pain or chest pain in general.  No previous history of blood clots, no recent surgery.  No hemoptysis.  Review of Systems  Positive: Shob, knee pain Negative:   Physical Exam  BP (!) 155/94   Pulse 92   Temp 98.4 F (36.9 C)   Resp 18   Ht 5' 7 (1.702 m)   Wt 90.1 kg   SpO2 100%   BMI 31.11 kg/m  Gen:   Awake, no distress   Resp:  Normal effort  MSK:   Moves extremities without difficulty  Other:  Mild ttp of left knee, no significant stepoff, deformity  Medical Decision Making  Medically screening exam initiated at 1:37 PM.  Appropriate orders placed.  Valerie Howard was informed that the remainder of the evaluation will be completed by another provider, this initial triage assessment does not replace that evaluation, and the importance of remaining in the ED until their evaluation is complete.  Workup initiated in triage    Valerie Howard DEL, [place] 06/14/24 1337

## 2024-06-14 NOTE — ED Provider Notes (Signed)
 Valerie Howard EMERGENCY DEPARTMENT AT [facility] Provider Note   CSN: [id number] Arrival date & time: 06/14/24  1231     Patient presents with: Joint Swelling and Knee Pain (Lt. Knee pain and swelling. Pain with walking.)   Valerie Howard is a 48 y.o. female.   HPI   48 year old female presents emergency department with left knee pain.  Admits that she is homeless, has been doing extensive walking, has gradually developed left knee pain and mild swelling.  Denies any other traumatic injury.  No fever or other symptoms.  Prior to Admission medications   Medication Sig Start Date End Date Taking? Authorizing Provider  acetaminophen  (TYLENOL ) 500 MG tablet Take 1 tablet (500 mg total) by mouth every 6 (six) hours as needed. Patient taking differently: Take 500 mg by mouth every 6 (six) hours as needed for mild pain (pain score 1-3) or moderate pain (pain score 4-6). 10/01/22   Nivia Colon, PA-C  megestrol  (MEGACE ) 40 MG tablet Take 2 tablets (80 mg total) by mouth 2 (two) times daily. 04/27/24   Francesca Elsie CROME, MD    Allergies: Penicillins    Review of Systems  Constitutional:  Negative for fever.  Respiratory:  Negative for shortness of breath.   Cardiovascular:  Negative for chest pain.  Musculoskeletal:        + Left knee pain    Updated Vital Signs BP (!) 131/98 (BP Location: Left Arm)   Pulse 90   Temp 98.4 F (36.9 C) (Oral)   Resp 18   Ht 5' 7 (1.702 m)   Wt 90.1 kg   SpO2 100%   BMI 31.11 kg/m   Physical Exam Vitals and nursing note reviewed.  Constitutional:      Appearance: Normal appearance.  HENT:     Head: Normocephalic.     Mouth/Throat:     Mouth: Mucous membranes are moist.  Cardiovascular:     Rate and Rhythm: Normal rate.  Pulmonary:     Effort: Pulmonary effort is normal. No respiratory distress.  Musculoskeletal:     Comments: Left knee exam is unremarkable, palpated and anatomy is nontender, knee is stable, no findings of DVT   Skin:    General: Skin is warm.  Neurological:     Mental Status: She is alert and oriented to person, place, and time. Mental status is at baseline.  Psychiatric:        Mood and Affect: Mood normal.     (all labs ordered are listed, but only abnormal results are displayed) Labs Reviewed  BASIC METABOLIC PANEL WITH GFR - Abnormal; Notable for the following components:      Result Value   Calcium 8.7 (*)    All other components within normal limits  CBC - Abnormal; Notable for the following components:   Hemoglobin 8.0 (*)    HCT 28.8 (*)    MCV 73.5 (*)    MCH 20.4 (*)    MCHC 27.8 (*)    RDW 19.6 (*)    All other components within normal limits  HCG, SERUM, QUALITATIVE  TROPONIN I (HIGH SENSITIVITY)  TROPONIN I (HIGH SENSITIVITY)    EKG: None  Radiology: DG Knee Complete 4 Views Left Result Date: 06/14/2024 CLINICAL DATA:  Knee pain. EXAM: LEFT KNEE - COMPLETE 4+ VIEW COMPARISON:  None Available. FINDINGS: No acute fracture or dislocation. No aggressive osseous lesion. The knee joint appears within normal limits. No significant arthritis. No knee effusion or focal soft tissue  swelling. No radiopaque foreign bodies. IMPRESSION: Negative. Electronically Signed   By: Ree Molt M.D.   On: 06/14/2024 14:31     Procedures   Medications Ordered in the ED  acetaminophen  (TYLENOL ) tablet 650 mg (650 mg Oral Given 06/14/24 2147)                                    Medical Decision Making Risk OTC drugs.   48 year old female presents emergency department with gradual left knee pain.  Blood work is baseline, knee x-ray is negative, physical exam is reassuring.  Doubt DVT.  She is asking for a sandwich and is otherwise comfortable with discharge.  Does not offer any other acute/emergent symptoms.  Patient at this time appears safe and stable for discharge and close outpatient follow up. Discharge plan and strict return to ED precautions discussed, patient verbalizes  understanding and agreement.     Final diagnoses:  Left knee pain, unspecified chronicity    ED Discharge Orders     None          Bari Roxie HERO, DO 06/14/24 2153

## 2024-06-14 NOTE — Discharge Instructions (Signed)
 You have been seen and discharged from the emergency department.  Your blood work was normal for you and the x-ray of your knee showed no fracture.  Follow-up with your primary provider for further evaluation and further care. Take home medications as prescribed. If you have any worsening symptoms or further concerns for your health please return to an emergency department for further evaluation.

## 2024-06-14 NOTE — ED Triage Notes (Signed)
 Pt bib POV c/o left knee swelling that started Wednesday. Pt denies injuring knee. Pt says she is homeless and believe the circumstances are causing the swelling. Pt denies any heart hx or sob. Pt states she can walk but just want to make sure there isn't nothing else going on.

## 2024-08-06 ENCOUNTER — Encounter (HOSPITAL_COMMUNITY): Payer: Self-pay | Admitting: Internal Medicine

## 2024-08-06 ENCOUNTER — Inpatient Hospital Stay (HOSPITAL_COMMUNITY)
Admission: EM | Admit: 2024-08-06 | Discharge: 2024-08-08 | DRG: 812 | Disposition: A | Discharge status: Home / Self Care | Payer: MEDICAID | Attending: Internal Medicine | Admitting: Internal Medicine

## 2024-08-06 ENCOUNTER — Other Ambulatory Visit: Payer: Self-pay

## 2024-08-06 DIAGNOSIS — N939 Abnormal uterine and vaginal bleeding, unspecified: Principal | ICD-10-CM

## 2024-08-06 DIAGNOSIS — D649 Anemia, unspecified: Secondary | ICD-10-CM | POA: Diagnosis present

## 2024-08-06 LAB — WET PREP, GENITAL
Clue Cells Wet Prep HPF POC: NONE SEEN
Sperm: NONE SEEN
Trich, Wet Prep: NONE SEEN
WBC, Wet Prep HPF POC: <10 (ref <10)
Yeast Wet Prep HPF POC: NONE SEEN

## 2024-08-06 LAB — CBC
HCT: 26.3 % — ABNORMAL LOW (ref 36.0–46.0)
Hemoglobin: 7.1 g/dL — ABNORMAL LOW (ref 12.0–15.0)
MCH: 18.5 pg — ABNORMAL LOW (ref 26.0–34.0)
MCHC: 27 g/dL — ABNORMAL LOW (ref 30.0–36.0)
MCV: 68.7 fL — ABNORMAL LOW (ref 80.0–100.0)
Platelets: 245 K/uL (ref 150–400)
RBC: 3.83 MIL/uL — ABNORMAL LOW (ref 3.87–5.11)
RDW: 22.2 % — ABNORMAL HIGH (ref 11.5–15.5)
WBC: 5 K/uL (ref 4.0–10.5)
nRBC: 0 % (ref 0.0–0.2)

## 2024-08-06 LAB — BASIC METABOLIC PANEL WITH GFR
Anion gap: 9 (ref 5–15)
BUN: 15 mg/dL (ref 6–20)
CO2: 25 mmol/L (ref 22–32)
Calcium: 9.1 mg/dL (ref 8.9–10.3)
Chloride: 105 mmol/L (ref 98–111)
Creatinine, Ser: 0.66 mg/dL (ref 0.44–1.00)
GFR, Estimated: >60 mL/min (ref >60)
Glucose, Bld: 86 mg/dL (ref 70–99)
Potassium: 3.8 mmol/L (ref 3.5–5.1)
Sodium: 139 mmol/L (ref 135–145)

## 2024-08-06 LAB — URINALYSIS, ROUTINE W REFLEX MICROSCOPIC
RBC / HPF: >50 RBC/hpf (ref 0–5)
WBC, UA: >50 WBC/hpf (ref 0–5)

## 2024-08-06 LAB — POC OCCULT BLOOD, ED: Fecal Occult Bld: POSITIVE — AB

## 2024-08-06 LAB — PREPARE RBC (CROSSMATCH)

## 2024-08-06 LAB — HIV ANTIBODY (ROUTINE TESTING W REFLEX): HIV Screen 4th Generation wRfx: NONREACTIVE

## 2024-08-06 LAB — HCG, SERUM, QUALITATIVE: Preg, Serum: NEGATIVE

## 2024-08-06 MED ORDER — ACETAMINOPHEN 650 MG RE SUPP: 650.0000 mg | Freq: Four times a day (QID) | RECTAL | Status: DC | PRN

## 2024-08-06 MED ORDER — MEGESTROL ACETATE 40 MG PO TABS
80.0000 mg | ORAL_TABLET | Freq: Two times a day (BID) | ORAL | Status: DC
  Administered 2024-08-06 – 2024-08-08 (×5): 80 mg via ORAL
  Filled 2024-08-06 (×7): qty 2

## 2024-08-06 MED ORDER — ALBUTEROL SULFATE (2.5 MG/3ML) 0.083% IN NEBU: 2.5000 mg | INHALATION_SOLUTION | RESPIRATORY_TRACT | Status: DC | PRN

## 2024-08-06 MED ORDER — ACETAMINOPHEN 325 MG PO TABS: 650.0000 mg | ORAL_TABLET | Freq: Four times a day (QID) | ORAL | Status: DC | PRN

## 2024-08-06 MED ORDER — TRAZODONE HCL 50 MG PO TABS
25.0000 mg | ORAL_TABLET | Freq: Every evening | ORAL | Status: DC | PRN
  Filled 2024-08-06: qty 1

## 2024-08-06 MED ORDER — ONDANSETRON HCL 4 MG PO TABS: 4.0000 mg | ORAL_TABLET | Freq: Four times a day (QID) | ORAL | Status: DC | PRN

## 2024-08-06 MED ORDER — ONDANSETRON HCL 4 MG/2ML IJ SOLN: 4.0000 mg | Freq: Four times a day (QID) | INTRAMUSCULAR | Status: DC | PRN

## 2024-08-06 MED ORDER — SODIUM CHLORIDE 0.9% IV SOLUTION: Freq: Once | INTRAVENOUS | Status: DC

## 2024-08-06 NOTE — H&P (Signed)
 History and Physical  Valerie Howard FMW:[id number] DOB: 1976/07/31 DOA: 08/06/2024  PCP: Pcp, No   Chief Complaint: Vaginal bleeding  HPI: Valerie Howard is a 48 y.o. female with medical history significant for schizoaffective disorder, paranoid behavior and fibroids causing heavy menorrhagia resulting in iron  deficiency anemia being admitted to the hospital with recurrent heavy vaginal bleeding for the past 48 hours resulting in acute on chronic anemia.  Patient is unfortunately homeless, has not been able to follow-up with gynecology since she was seen by them during her last hospitalization in February 2025.  At the time, she was given IV iron , as well as 1 unit PRBCs, and was offered uterine artery embolization but declined this.  States that she has been stable until 48 hours ago, when she started having heavy vaginal bleeding again.  Denies chest pain, but endorses some dizziness lightheadedness and fatigue when ambulating.  Denies any blood in her stool, or melena.  No vomiting.  Review of Systems: Please see HPI for pertinent positives and negatives. A complete 10 system review of systems are otherwise negative.  Past Medical History:  Diagnosis Date   History of radiation therapy 07/18/17-07/24/17   right ear, operative bed 12 Gy in 3 fractions   Keloid    right ear   Paranoid behavior (HCC)    Schizophrenia (HCC)    Past Surgical History:  Procedure Laterality Date   CESAREAN SECTION     KENALOG  INJECTION Bilateral 07/17/2017   Procedure: KENALOG  INJECTION;  Surgeon: Valerie Estefana RAMAN, DO;  Location: [facility];  Service: Plastics;  Laterality: Bilateral;   MASS EXCISION N/A 07/17/2017   Procedure: EXCISION KELOID OF RIGHT EAR WITH INTROPERATIVE KENALOG  INJECTION AND POST OP RADIATION;  Surgeon: Valerie Estefana RAMAN, DO;  Location: [facility];  Service: Plastics;  Laterality: N/A;   TUBAL LIGATION     Social History:  reports that she has  never smoked. She has never used smokeless tobacco. She reports current alcohol  use. She reports that she does not use drugs.  Allergies  Allergen Reactions   Penicillins Hives and Itching    Has patient had a PCN reaction causing immediate rash, facial/tongue/throat swelling, SOB or lightheadedness with hypotension:Patient refuses to answer (PRA) Has patient had a PCN reaction causing severe rash involving mucus membranes or skin necrosis:PRA Has patient had a PCN reaction that required hospitalization PRA Has patient had a PCN reaction occurring within the last 10 years:PRA If all of the above answers are NO, then may proceed with Cephalosporin use.     Family History  Problem Relation Age of Onset   Keloids Mother    Breast cancer Maternal Grandmother    Hypertension Other    Diabetes Other    Cancer Other    Heart attack Other      Prior to Admission medications   Medication Sig Start Date End Date Taking? Authorizing Provider  acetaminophen  (TYLENOL ) 500 MG tablet Take 1 tablet (500 mg total) by mouth every 6 (six) hours as needed. Patient taking differently: Take 500 mg by mouth every 6 (six) hours as needed for mild pain (pain score 1-3) or moderate pain (pain score 4-6). 10/01/22   Valerie Colon, PA-C  megestrol  (MEGACE ) 40 MG tablet Take 2 tablets (80 mg total) by mouth 2 (two) times daily. 04/27/24   Valerie Elsie LITTIE, MD    Physical Exam: BP (!) 113/96 (BP Location: Right Arm)   Pulse 85   Temp 98.5 F (  36.9 C) (Oral)   Resp 17   SpO2 100%  General:  Alert, oriented, calm, in no acute distress  Eyes: EOMI, clear conjuctivae, white sclerea Neck: supple, no masses, trachea mildline  Cardiovascular: RRR, no murmurs or rubs, no peripheral edema  Respiratory: clear to auscultation bilaterally, no wheezes, no crackles  Abdomen: soft, nontender, nondistended, normal bowel tones heard  Skin: dry, no rashes  Musculoskeletal: no joint effusions, normal range of motion   Psychiatric: appropriate affect, normal speech  Neurologic: extraocular muscles intact, clear speech, moving all extremities with intact sensorium         Labs on Admission:  Basic Metabolic Panel: Recent Labs  Lab 08/06/24 0917  NA 139  K 3.8  CL 105  CO2 25  GLUCOSE 86  BUN 15  CREATININE 0.66  CALCIUM 9.1   Liver Function Tests: No results for input(s): AST, ALT, ALKPHOS, BILITOT, PROT, ALBUMIN in the last 168 hours. No results for input(s): LIPASE, AMYLASE in the last 168 hours. No results for input(s): AMMONIA in the last 168 hours. CBC: Recent Labs  Lab 08/06/24 0917  WBC 5.0  HGB 7.1*  HCT 26.3*  MCV 68.7*  PLT 245   Cardiac Enzymes: No results for input(s): CKTOTAL, CKMB, CKMBINDEX, TROPONINI in the last 168 hours. BNP (last 3 results) No results for input(s): BNP in the last 8760 hours.  ProBNP (last 3 results) No results for input(s): PROBNP in the last 8760 hours.  CBG: No results for input(s): GLUCAP in the last 168 hours.  Radiological Exams on Admission: No results found. Assessment/Plan Valerie Howard is a 48 y.o. female with medical history significant for schizoaffective disorder, paranoid behavior and fibroids causing heavy menorrhagia resulting in iron  deficiency anemia being admitted to the hospital with recurrent heavy vaginal bleeding for the past 48 hours resulting in acute on chronic anemia.  Acute on chronic symptomatic iron  deficiency anemia-due to heavy menorrhagia.  Patient denies melanotic stools or frank GI bleeding, but Hemoccult positive in the ER today. -Observation admission -Due to symptomatology, will transfuse 1 unit PRBCs today  Menorrhagia-patient with known fibroids, in February was seen by GYN and they recommended endometrial biopsy to rule out cancer or hyperplasia, also discussed uterine artery embolization but patient declined at the time. -Continue Megace , which the patient says that  she takes only very intermittently -Trend hemoglobin, await further GYN recs although patient may continue to refuse intervention  Schizophrenia-with paranoid features -Not currently on maintenance medications -Psychiatry has been consulted for recommendations  DVT prophylaxis: SCDs only    Code Status: Full Code  Consults called: Inpatient psychiatry, ER provider also discussed in consultation with Dr. Lynwood Solomons who will review chart for any further recommendations, and states they are available for formal consult if needed.  Admission status: Observation  Time spent: 49 minutes  Lemond Griffee CHRISTELLA Gail MD [facility] Pager [phone]  If 7PM-7AM, please contact night-coverage [email or web address] Password [email or web address]  08/06/2024, 1:23 PM

## 2024-08-06 NOTE — Plan of Care (Signed)
  Problem: Education: Goal: Knowledge of General Education information will improve Description: Including pain rating scale, medication(s)/side effects and non-pharmacologic comfort measures Outcome: Progressing   Problem: Health Behavior/Discharge Planning: Goal: Ability to manage health-related needs will improve Outcome: Progressing   Problem: Clinical Measurements: Goal: Ability to maintain clinical measurements within normal limits will improve Outcome: Progressing Goal: Will remain free from infection Outcome: Progressing Goal: Diagnostic test results will improve Outcome: Progressing Goal: Respiratory complications will improve Outcome: Progressing Goal: Cardiovascular complication will be avoided Outcome: Progressing   Problem: Activity: Goal: Risk for activity intolerance will decrease Outcome: Progressing   Problem: Nutrition: Goal: Adequate nutrition will be maintained Outcome: Progressing   Problem: Coping: Goal: Level of anxiety will decrease Outcome: Progressing   Problem: Elimination: Goal: Will not experience complications related to bowel motility Outcome: Progressing Goal: Will not experience complications related to urinary retention Outcome: Progressing   Problem: Pain Managment: Goal: General experience of comfort will improve and/or be controlled Outcome: Progressing   Problem: Safety: Goal: Ability to remain free from injury will improve Outcome: Progressing   Problem: Skin Integrity: Goal: Risk for impaired skin integrity will decrease Outcome: Progressing   Problem: Education: Goal: Knowledge of [place] [facility] Education information/materials will improve Outcome: Progressing Goal: Emotional status will improve Outcome: Progressing Goal: Mental status will improve Outcome: Progressing Goal: Verbalization of understanding the information provided will improve Outcome: Progressing   Problem: Activity: Goal: Interest or  engagement in activities will improve Outcome: Progressing Goal: Sleeping patterns will improve Outcome: Progressing   Problem: Coping: Goal: Ability to verbalize frustrations and anger appropriately will improve Outcome: Progressing Goal: Ability to demonstrate self-control will improve Outcome: Progressing   Problem: Health Behavior/Discharge Planning: Goal: Identification of resources available to assist in meeting health care needs will improve Outcome: Progressing Goal: Compliance with treatment plan for underlying cause of condition will improve Outcome: Progressing   Problem: Physical Regulation: Goal: Ability to maintain clinical measurements within normal limits will improve Outcome: Progressing   Problem: Safety: Goal: Periods of time without injury will increase Outcome: Progressing

## 2024-08-06 NOTE — ED Provider Notes (Signed)
 [place] [facility]PART[facility] AT [facility] Provider Note   CSN: [id number] Arrival date & time: 08/06/24  9183     Patient presents with: No chief complaint on file.  HPI Valerie Howard is a 48 y.o. female with schizophrenia, fibroids currently unhoused presenting for vaginal bleeding for the past 2 days.  Suboptimal historian.  She states she has had to change her pads 3 times in the last 48 hours.  Was seen here recently for same and has not yet established an appointment with a gynecologist. She states she is still currently unhoused.  Denies sexual activity.  Denies abdominal pain.  Endorses fatigue and lightheadedness denies shortness of breath.  Also states that she thinks someone is controlling her vaginal bleeding by remote.  She listed 3 people that she thinks are responsible.  Denies SI, HI or hallucinations.  Also reports that she was prescribed Megace  last time she was evaluated for vaginal bleeding in September but did not take it because she states it does not work.   HPI     Prior to Admission medications   Medication Sig Start Date End Date Taking? Authorizing Provider  acetaminophen  (TYLENOL ) 500 MG tablet Take 1 tablet (500 mg total) by mouth every 6 (six) hours as needed. Patient taking differently: Take 500 mg by mouth every 6 (six) hours as needed for mild pain (pain score 1-3) or moderate pain (pain score 4-6). 10/01/22   Nivia Colon, PA-C  megestrol  (MEGACE ) 40 MG tablet Take 2 tablets (80 mg total) by mouth 2 (two) times daily. 04/27/24   Francesca Elsie LITTIE, MD    Allergies: Penicillins    Review of Systems See HPI  Updated Vital Signs BP (!) 113/96 (BP Location: Right Arm)   Pulse 85   Temp 98.5 F (36.9 C) (Oral)   Resp 17   SpO2 100%   Physical Exam Vitals and nursing note reviewed. Exam conducted with a chaperone present.  HENT:     Head: Normocephalic and atraumatic.     Mouth/Throat:     Mouth: Mucous membranes are moist.   Eyes:     General:        Right eye: No discharge.        Left eye: No discharge.     Conjunctiva/sclera: Conjunctivae normal.  Cardiovascular:     Rate and Rhythm: Normal rate and regular rhythm.     Pulses: Normal pulses.     Heart sounds: Normal heart sounds.  Pulmonary:     Effort: Pulmonary effort is normal.     Breath sounds: Normal breath sounds.  Abdominal:     General: Abdomen is flat.     Palpations: Abdomen is soft.     Tenderness: There is no abdominal tenderness.  Genitourinary:    Rectum: Guaiac result positive. No tenderness.      Comments: Mild to moderate amount of blood in the vaginal vault.  No active bleeding visualized.  Could not visualize cervix due to the blood. Skin:    General: Skin is warm and dry.  Neurological:     General: No focal deficit present.  Psychiatric:        Mood and Affect: Mood normal.        Speech: Speech is tangential.        Behavior: Behavior normal.        Thought Content: Thought content is paranoid.        Cognition and Memory: Cognition normal.  Judgment: Judgment normal.     (all labs ordered are listed, but only abnormal results are displayed) Labs Reviewed  CBC - Abnormal; Notable for the following components:      Result Value   RBC 3.83 (*)    Hemoglobin 7.1 (*)    HCT 26.3 (*)    MCV 68.7 (*)    MCH 18.5 (*)    MCHC 27.0 (*)    RDW 22.2 (*)    All other components within normal limits  URINALYSIS, ROUTINE W REFLEX MICROSCOPIC - Abnormal; Notable for the following components:   Color, Urine RED (*)    APPearance TURBID (*)    Glucose, UA   (*)    Value: TEST NOT REPORTED DUE TO COLOR INTERFERENCE OF URINE PIGMENT   Hgb urine dipstick   (*)    Value: TEST NOT REPORTED DUE TO COLOR INTERFERENCE OF URINE PIGMENT   Bilirubin Urine   (*)    Value: TEST NOT REPORTED DUE TO COLOR INTERFERENCE OF URINE PIGMENT   Ketones, ur   (*)    Value: TEST NOT REPORTED DUE TO COLOR INTERFERENCE OF URINE PIGMENT    Protein, ur   (*)    Value: TEST NOT REPORTED DUE TO COLOR INTERFERENCE OF URINE PIGMENT   Nitrite   (*)    Value: TEST NOT REPORTED DUE TO COLOR INTERFERENCE OF URINE PIGMENT   Leukocytes,Ua   (*)    Value: TEST NOT REPORTED DUE TO COLOR INTERFERENCE OF URINE PIGMENT   Bacteria, UA MANY (*)    All other components within normal limits  POC OCCULT BLOOD, ED - Abnormal; Notable for the following components:   Fecal Occult Bld POSITIVE (*)    All other components within normal limits  WET PREP, GENITAL  BASIC METABOLIC PANEL WITH GFR  HCG, SERUM, QUALITATIVE  SYPHILIS: RPR W/REFLEX TO RPR TITER AND TREPONEMAL ANTIBODIES, TRADITIONAL SCREENING AND DIAGNOSIS ALGORITHM  HIV ANTIBODY (ROUTINE TESTING W REFLEX)  GC/CHLAMYDIA PROBE AMP ([place]) NOT AT [facility]    EKG: None  Radiology: No results found.   Procedures   Medications Ordered in the ED  megestrol  (MEGACE ) tablet 80 mg (has no administration in time range)  acetaminophen  (TYLENOL ) tablet 650 mg (has no administration in time range)    Or  acetaminophen  (TYLENOL ) suppository 650 mg (has no administration in time range)  traZODone  (DESYREL ) tablet 25 mg (has no administration in time range)  ondansetron  (ZOFRAN ) tablet 4 mg (has no administration in time range)    Or  ondansetron  (ZOFRAN ) injection 4 mg (has no administration in time range)  albuterol  (PROVENTIL ) (2.5 MG/3ML) 0.083% nebulizer solution 2.5 mg (has no administration in time range)                                    Medical Decision Making Amount and/or Complexity of Data Reviewed Labs: ordered.  Risk Decision regarding hospitalization.   Initial Impression and Ddx 48 year old well-appearing female presenting for vaginal bleeding.  Exam notable for blood in the vaginal vault along with what appears to be a right sided Bartholin cyst without signs of associated infection.  Also found to be Hemoccult positive. Patient PMH that increases complexity of  ED encounter:  schizophrenia, currently unhoused  Interpretation of Diagnostics - I independent reviewed and interpreted the labs as followed: Hgb 7.1, Hemoccult positive, hematuria   Patient Reassessment and Ultimate Disposition/Management Discussed patient  with Dr. Lynwood Solomons of OB/GYN who recommended hospital admission here at [facility] and they would be available in consult.  No further recommendations at this time.  Suspect the likely source of her bleeding is vaginal but patient was noted to be Hemoccult positive with blood in the urine as well.  Admitted to hospital service with Dr. Zella.  Has remained hemodynamically stable on room air.  Patient management required discussion with the following services or consulting groups:  Hospitalist Service and OB/GYN  Complexity of Problems Addressed Acute complicated illness or Injury  Additional Data Reviewed and Analyzed Further history obtained from: Past medical history and medications listed in the EMR and Prior ED visit notes  Patient Encounter Risk Assessment Consideration of hospitalization      Final diagnoses:  Vaginal bleeding  Anemia, unspecified type    ED Discharge Orders     None          Lang Norleen POUR, PA-C 08/06/24 1320    Jerrol Lynwood, MD 08/06/24 1349

## 2024-08-06 NOTE — ED Triage Notes (Addendum)
 Pt arrives via EMS from the street. Pt has complaints of vaginal bleeding for the past 2 days. PT is unhoused and been unable to follow up with GYN resources. Pt states that she feels like someone has a remote control that alters her cycle.   EMS VS BP 198/76 P 90 Glucose 104mg /dL

## 2024-08-07 DIAGNOSIS — Z803 Family history of malignant neoplasm of breast: Secondary | ICD-10-CM | POA: Diagnosis not present

## 2024-08-07 DIAGNOSIS — N939 Abnormal uterine and vaginal bleeding, unspecified: Secondary | ICD-10-CM | POA: Diagnosis present

## 2024-08-07 DIAGNOSIS — N920 Excessive and frequent menstruation with regular cycle: Secondary | ICD-10-CM | POA: Diagnosis present

## 2024-08-07 DIAGNOSIS — Z8249 Family history of ischemic heart disease and other diseases of the circulatory system: Secondary | ICD-10-CM | POA: Diagnosis not present

## 2024-08-07 DIAGNOSIS — D62 Acute posthemorrhagic anemia: Principal | ICD-10-CM

## 2024-08-07 DIAGNOSIS — Z5948 Other specified lack of adequate food: Secondary | ICD-10-CM | POA: Diagnosis not present

## 2024-08-07 DIAGNOSIS — Z5941 Food insecurity: Secondary | ICD-10-CM | POA: Diagnosis not present

## 2024-08-07 DIAGNOSIS — Z833 Family history of diabetes mellitus: Secondary | ICD-10-CM | POA: Diagnosis not present

## 2024-08-07 DIAGNOSIS — Z880 Allergy status to penicillin: Secondary | ICD-10-CM | POA: Diagnosis not present

## 2024-08-07 DIAGNOSIS — Z79899 Other long term (current) drug therapy: Secondary | ICD-10-CM | POA: Diagnosis not present

## 2024-08-07 DIAGNOSIS — Z91199 Patient's noncompliance with other medical treatment and regimen due to unspecified reason: Secondary | ICD-10-CM | POA: Diagnosis not present

## 2024-08-07 DIAGNOSIS — F200 Paranoid schizophrenia: Secondary | ICD-10-CM | POA: Diagnosis present

## 2024-08-07 DIAGNOSIS — Z5900 Homelessness unspecified: Secondary | ICD-10-CM | POA: Diagnosis not present

## 2024-08-07 DIAGNOSIS — Z5329 Procedure and treatment not carried out because of patient's decision for other reasons: Secondary | ICD-10-CM | POA: Diagnosis present

## 2024-08-07 DIAGNOSIS — D259 Leiomyoma of uterus, unspecified: Secondary | ICD-10-CM | POA: Diagnosis present

## 2024-08-07 DIAGNOSIS — Z923 Personal history of irradiation: Secondary | ICD-10-CM | POA: Diagnosis not present

## 2024-08-07 DIAGNOSIS — Z5982 Transportation insecurity: Secondary | ICD-10-CM | POA: Diagnosis not present

## 2024-08-07 DIAGNOSIS — R8271 Bacteriuria: Secondary | ICD-10-CM | POA: Diagnosis present

## 2024-08-07 DIAGNOSIS — Z560 Unemployment, unspecified: Secondary | ICD-10-CM | POA: Diagnosis not present

## 2024-08-07 DIAGNOSIS — F22 Delusional disorders: Secondary | ICD-10-CM

## 2024-08-07 DIAGNOSIS — F2089 Other schizophrenia: Secondary | ICD-10-CM

## 2024-08-07 LAB — GC/CHLAMYDIA PROBE AMP (~~LOC~~) NOT AT ARMC
Chlamydia: NEGATIVE
Comment: NEGATIVE
Comment: NORMAL
Neisseria Gonorrhea: NEGATIVE

## 2024-08-07 LAB — PREPARE RBC (CROSSMATCH)

## 2024-08-07 LAB — BASIC METABOLIC PANEL WITH GFR
Anion gap: 9 (ref 5–15)
BUN: 15 mg/dL (ref 6–20)
CO2: 24 mmol/L (ref 22–32)
Calcium: 8.8 mg/dL — ABNORMAL LOW (ref 8.9–10.3)
Chloride: 105 mmol/L (ref 98–111)
Creatinine, Ser: 0.83 mg/dL (ref 0.44–1.00)
GFR, Estimated: >60 mL/min (ref >60)
Glucose, Bld: 107 mg/dL — ABNORMAL HIGH (ref 70–99)
Potassium: 3.7 mmol/L (ref 3.5–5.1)
Sodium: 138 mmol/L (ref 135–145)

## 2024-08-07 LAB — CBC
HCT: 23.6 % — ABNORMAL LOW (ref 36.0–46.0)
Hemoglobin: 6.5 g/dL — CL (ref 12.0–15.0)
MCH: 19.6 pg — ABNORMAL LOW (ref 26.0–34.0)
MCHC: 27.5 g/dL — ABNORMAL LOW (ref 30.0–36.0)
MCV: 71.1 fL — ABNORMAL LOW (ref 80.0–100.0)
Platelets: 219 K/uL (ref 150–400)
RBC: 3.32 MIL/uL — ABNORMAL LOW (ref 3.87–5.11)
RDW: 22.5 % — ABNORMAL HIGH (ref 11.5–15.5)
WBC: 6 K/uL (ref 4.0–10.5)
nRBC: 0 % (ref 0.0–0.2)

## 2024-08-07 LAB — HEMOGLOBIN AND HEMATOCRIT, BLOOD
HCT: 29.1 % — ABNORMAL LOW (ref 36.0–46.0)
Hemoglobin: 8.6 g/dL — ABNORMAL LOW (ref 12.0–15.0)

## 2024-08-07 LAB — SYPHILIS: RPR W/REFLEX TO RPR TITER AND TREPONEMAL ANTIBODIES, TRADITIONAL SCREENING AND DIAGNOSIS ALGORITHM: RPR Ser Ql: NONREACTIVE

## 2024-08-07 MED ORDER — SODIUM CHLORIDE 0.9% IV SOLUTION: Freq: Once | INTRAVENOUS | Status: AC

## 2024-08-07 NOTE — Plan of Care (Signed)
  Problem: Education: Goal: Knowledge of General Education information will improve Description: Including pain rating scale, medication(s)/side effects and non-pharmacologic comfort measures Outcome: Progressing   Problem: Health Behavior/Discharge Planning: Goal: Ability to manage health-related needs will improve Outcome: Progressing   Problem: Clinical Measurements: Goal: Ability to maintain clinical measurements within normal limits will improve Outcome: Progressing Goal: Will remain free from infection Outcome: Progressing Goal: Diagnostic test results will improve Outcome: Progressing Goal: Respiratory complications will improve Outcome: Progressing Goal: Cardiovascular complication will be avoided Outcome: Progressing   Problem: Activity: Goal: Risk for activity intolerance will decrease Outcome: Progressing   Problem: Nutrition: Goal: Adequate nutrition will be maintained Outcome: Progressing   Problem: Coping: Goal: Level of anxiety will decrease Outcome: Progressing   Problem: Elimination: Goal: Will not experience complications related to bowel motility Outcome: Progressing Goal: Will not experience complications related to urinary retention Outcome: Progressing   Problem: Pain Managment: Goal: General experience of comfort will improve and/or be controlled Outcome: Progressing   Problem: Safety: Goal: Ability to remain free from injury will improve Outcome: Progressing   Problem: Skin Integrity: Goal: Risk for impaired skin integrity will decrease Outcome: Progressing   Problem: Education: Goal: Knowledge of [place] [facility] Education information/materials will improve Outcome: Progressing Goal: Emotional status will improve Outcome: Progressing Goal: Mental status will improve Outcome: Progressing Goal: Verbalization of understanding the information provided will improve Outcome: Progressing   Problem: Activity: Goal: Interest or  engagement in activities will improve Outcome: Progressing Goal: Sleeping patterns will improve Outcome: Progressing   Problem: Coping: Goal: Ability to verbalize frustrations and anger appropriately will improve Outcome: Progressing Goal: Ability to demonstrate self-control will improve Outcome: Progressing   Problem: Health Behavior/Discharge Planning: Goal: Identification of resources available to assist in meeting health care needs will improve Outcome: Progressing Goal: Compliance with treatment plan for underlying cause of condition will improve Outcome: Progressing   Problem: Physical Regulation: Goal: Ability to maintain clinical measurements within normal limits will improve Outcome: Progressing   Problem: Safety: Goal: Periods of time without injury will increase Outcome: Progressing

## 2024-08-07 NOTE — Consult Note (Signed)
 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 She also carries a diagnosis of chronic anemia secondary to persistent vaginal bleeding, for which she has repeatedly refused medical interventions such as hysterectomy, ablation, or other gynecologic recommendations. Historically, she has been encouraged to follow up with OB/GYN for management of her bleeding and to establish outpatient iron  infusions or blood transfusions, but she remains noncompliant and has poor outpatient follow-up. She is currently unhoused and often unable to maintain personal hygiene.  On todays date, psychiatry was consulted for feels like someone has a remote control that alters her cycle. This is a chronic, well-documented fixed delusion for this patient, consistent with her established psychiatric baseline. She frequently presents to the hospital when she is feeling physically unwell, often related to anemia, and intermittently takes Megace  as she recognizes it helps with her bleeding.  Upon evaluation, the provider introduced self and department. The patient replied, verbatim: Its good to meet you. I do not need a psychiatrist. Have you ever watched C-SPAN before? Its funny. You should watch it, but thank you for coming by. I do not know you and I do not need a psychiatrist at this time, so you can go blow up balloons or something and you can leave.  During the encounter, the patient appeared alert and oriented. She was not responding to internal or external stimuli, and no persecutory or new delusional content was observed beyond her longstanding fixed belief. She denied suicidal ideation, homicidal ideation, or auditory/visual hallucinations. Affect was mildly irritable but appropriate in context. She laughed appropriately at the television and engaged minimally but  coherently.  At this time, she does not meet criteria for mania, acute psychosis, or psychiatric decompensation beyond her chronic baseline. She continues to decline psychiatric care and medication. Given her stable presentation relative to her known pattern, no acute psychiatric intervention is indicated.  Assessment: Chronic schizophrenia with fixed delusional disorder, currently at psychiatric baseline. Chronic medical comorbidity: anemia secondary to vaginal bleeding. Behavioral presentation consistent with prior admissions; no acute safety concerns identified.  Plan:  Psychiatry to sign off at this time per patients request and stable presentation.  If patient becomes agitated or aggressive, a curbside consult can be requested for recommendations regarding short-term psychotropic intervention.  If left alone, the patient generally does not escalate and remains behaviorally controlled.  Continue medical management for anemia per primary and OB/GYN teams.

## 2024-08-07 NOTE — Consult Note (Incomplete)
 [place] [facility] {CHL [facility] INITIAL OR FOLLOW LE:68184}  Patient Name: .Valerie Howard  MRN: [id number]  DOB: [birth year]  Consult Order details:  Orders (From admission, onward)     Start     Ordered   08/06/24 1657  IP CONSULT TO PSYCHIATRY       Ordering Provider: Motley-Mangrum, Cathaleen LABOR, PMHNP  Provider:  (Not yet assigned)  Question Answer Comment  Location [facility]   Reason for Consult? feels like someone has a remote control that alters her cycle      08/06/24 1657             Mode of Visit: {Type of visit:31911}    Psychiatry Consult Evaluation  Service Date: August 07, 2024 LOS:  LOS: 0 days  Chief Complaint ***  Primary Psychiatric Diagnoses  *** 2.  *** 3.  ***  Assessment  Valerie Howard is a 48 y.o. female admitted: {CHL BH Medical or Presented to ZI:68182}qnm 08/06/2024  8:31 AM for ***. She carries the psychiatric diagnoses of *** and has a past medical history of  ***.   Her current presentation of *** is most consistent with ***. She meets criteria for *** based on ***.  Current outpatient psychotropic medications include *** and historically she has had a *** response to these medications. She was *** compliant with medications prior to admission as evidenced by ***. On initial examination, patient ***. Please see plan below for detailed recommendations.   Diagnoses:  Active Hospital problems: Active Problems:   Symptomatic anemia    Plan   ## Psychiatric Medication Recommendations:  ***  ## Medical Decision Making Capacity: {CHL BH MEDICAL DECISION MAKING CAPACITY:31818}  ## Further Work-up:  -- *** {CHLmacgeneralandspecificworkuprecs:31821} -- most recent EKG on *** had QtC of *** -- Pertinent labwork reviewed earlier this admission includes: ***   ## Disposition:-- {CHLmaccldispo:31820}  ## Behavioral / Environmental: -{CHLmacbehavioralenvironmental2:31847}    ## Safety and Observation  Level:  - Based on my clinical evaluation, I estimate the patient to be at *** risk of self harm in the current setting. - At this time, we recommend  {CHL BH SUICIDE OBSERVATION LEVEL:31850}. This decision is based on my review of the chart including patient's history and current presentation, interview of the patient, mental status examination, and consideration of suicide risk including evaluating suicidal ideation, plan, intent, suicidal or self-harm behaviors, risk factors, and protective factors. This judgment is based on our ability to directly address suicide risk, implement suicide prevention strategies, and develop a safety plan while the patient is in the clinical setting. Please contact our team if there is a concern that risk level has changed.  CSSR Risk Category:C-SSRS RISK CATEGORY: No Risk  Suicide Risk Assessment: Patient has following modifiable risk factors for suicide: {CHLmacmodifiablesuicideriskfactors:31822}, which we are addressing by ***. Patient has following non-modifiable or demographic risk factors for suicide: {CHLmacnonmodifiablesuicideriskfactors:31823} Patient has the following protective factors against suicide: {CHLmacprotectivefactors:31824}  Thank you for this consult request. Recommendations have been communicated to the primary team.  We will *** at this time.   Majel GORMAN Ramp, FNP       History of Present Illness  Relevant Aspects of [facility] or ED course:31819} Course:  Admitted on 08/06/2024 for ***. They ***.   Patient Report:  ***  Psych ROS:  Depression: *** Anxiety:  *** Mania (lifetime and current): *** Psychosis: (lifetime and current): ***  Collateral information:  Contacted *** at ***  on ***  ROS   Psychiatric and Social History  Psychiatric History:  Information collected from ***  Prev Dx/Sx: *** Current Psych Provider: *** Home Meds (current): *** Previous Med Trials: *** Therapy: ***  Prior Psych  Hospitalization: ***  Prior Self Harm: *** Prior Violence: ***  Family Psych History: *** Family Hx suicide: ***  Social History:  Developmental Hx: *** Educational Hx: *** Occupational Hx: *** Legal Hx: *** Living Situation: *** Spiritual Hx: *** Access to weapons/lethal means: ***   Substance History Alcohol : ***  Type of alcohol  *** Last Drink *** Number of drinks per day *** History of alcohol  withdrawal seizures *** History of DT's *** Tobacco: *** Illicit drugs: *** Prescription drug abuse: *** Rehab hx: ***  Exam Findings  Physical Exam: *** Vital Signs:  Temp:  [98.5 F (36.9 C)-99.6 F (37.6 C)] 99 F (37.2 C) (12/10 0725) Pulse Rate:  [75-103] 80 (12/10 0725) Resp:  [16-18] 18 (12/10 0521) BP: (118-150)/(56-84) 150/84 (12/10 0725) SpO2:  [99 %-100 %] 100 % (12/10 0725) Weight:  [95.8 kg] 95.8 kg (12/09 1658) Blood pressure (!) 150/84, pulse 80, temperature 99 F (37.2 C), temperature source Oral, resp. rate 18, height 5' 7 (1.702 m), weight 95.8 kg, SpO2 100%. Body mass index is 33.09 kg/m.  Physical Exam  Mental Status Exam: General Appearance: {Appearance:22683}  Orientation:  {BHH ORIENTATION (PAA):22689}  Memory:  {BHH MEMORY:22881}  Concentration:  {Concentration:21399}  Recall:  {BHH GOOD/FAIR/POOR:22877}  Attention  {BH Attention Span:31825}  Eye Contact:  {BHH EYE CONTACT:22684}  Speech:  {Speech:22685}  Language:  {BHH GOOD/FAIR/POOR:22877}  Volume:  {Volume (PAA):22686}  Mood: ***  Affect:  {Affect (PAA):22687}  Thought Process:  {Thought Process (PAA):22688}  Thought Content:  {Thought Content:22690}  Suicidal Thoughts:  {ST/HT (PAA):22692}  Homicidal Thoughts:  {ST/HT (PAA):22692}  Judgement:  {Judgement (PAA):22694}  Insight:  {Insight (PAA):22695}  Psychomotor Activity:  {Psychomotor (PAA):22696}  Akathisia:  {BHH YES OR NO:22294}  Fund of Knowledge:  {BHH GOOD/FAIR/POOR:22877}      Assets:  {Assets (PAA):22698}   Cognition:  {chl bhh cognition:304700322}  ADL's:  {BHH JIO'D:77709}  AIMS (if indicated):        Other History   These have been pulled in through the EMR, reviewed, and updated if appropriate.  Family History:  The patient's family history includes Breast cancer in her maternal grandmother; Cancer in an other family member; Diabetes in an other family member; Heart attack in an other family member; Hypertension in an other family member; Keloids in her mother.  Medical History: Past Medical History:  Diagnosis Date   History of radiation therapy 07/18/17-07/24/17   right ear, operative bed 12 Gy in 3 fractions   Keloid    right ear   Paranoid behavior (HCC)    Schizophrenia (HCC)     Surgical History: Past Surgical History:  Procedure Laterality Date   CESAREAN SECTION     KENALOG  INJECTION Bilateral 07/17/2017   Procedure: KENALOG  INJECTION;  Surgeon: Lowery Estefana RAMAN, DO;  Location: [facility];  Service: Plastics;  Laterality: Bilateral;   MASS EXCISION N/A 07/17/2017   Procedure: EXCISION KELOID OF RIGHT EAR WITH INTROPERATIVE KENALOG  INJECTION AND POST OP RADIATION;  Surgeon: Lowery Estefana RAMAN, DO;  Location: [facility];  Service: Plastics;  Laterality: N/A;   TUBAL LIGATION       Medications:   Current Facility-Administered Medications:    0.9 %  sodium chloride  infusion (Manually program via Guardrails IV Fluids), , Intravenous,  Once, Zella, Mir M, MD   acetaminophen  (TYLENOL ) tablet 650 mg, 650 mg, Oral, Q6H PRN **OR** acetaminophen  (TYLENOL ) suppository 650 mg, 650 mg, Rectal, Q6H PRN, Zella, Mir M, MD   albuterol  (PROVENTIL ) (2.5 MG/3ML) 0.083% nebulizer solution 2.5 mg, 2.5 mg, Nebulization, Q2H PRN, Zella, Mir M, MD   megestrol  (MEGACE ) tablet 80 mg, 80 mg, Oral, BID, Ikramullah, Mir M, MD, 80 mg at 08/06/24 2158   ondansetron  (ZOFRAN ) tablet 4 mg, 4 mg, Oral, Q6H PRN **OR** ondansetron  (ZOFRAN )  injection 4 mg, 4 mg, Intravenous, Q6H PRN, Zella, Mir M, MD   traZODone  (DESYREL ) tablet 25 mg, 25 mg, Oral, QHS PRN, Zella, Mir M, MD  Allergies: Allergies  Allergen Reactions   Penicillins Hives and Itching    Has patient had a PCN reaction causing immediate rash, facial/tongue/throat swelling, SOB or lightheadedness with hypotension: Unk Has patient had a PCN reaction causing severe rash involving mucus membranes or skin necrosis: Unk Has patient had a PCN reaction that required hospitalization: Unk Has patient had a PCN reaction occurring within the last 10 years: Unk If all of the above answers are NO, then may proceed with Cephalosporin use.     Majel GORMAN Ramp, FNP

## 2024-08-07 NOTE — Consult Note (Signed)
 Impression and recommendations:  # Heavy menstrual bleeding Most likely secondary to known fibroids. Chronic problem and, unfortunately, her uncontrolled mental illness means she has not complied with treatments and follow up. She is hemodynamically stable and has responded to oral progestins in the past and appears to be responding now, reporting that her bleeding today is significantly less than yesterday. Patient is s/p tubal ligation and her hcg is negative.  - Although it is not urgent, the patient needs an endometrial biopsy to rule out endometrial cancer/hyperplasia. I offered to attempt this at bedside but the patient firmly declines. As we have advised in the past, we advise she follow up with our group [facility]) as an outpatient for this procedure. Will provide our contact information in the discharge follow-up tab. Various surgical and non-surgical long-term treatment options are available but will require outpatient follow-up. - continue megace  80 mg twice daily, this can be up-titrated to three times daily as needed. Assuming appropriate response can down-titrate as outpatient to 40 mg twice a day, would continue for at least 10 days - assuming continued improvement in bleeding tomorrow and stability in hemoglobin, would be safe for discharge then on oral progestin  # Acute on chronic blood loss anemia # Iron  deficiency Recent hgb of 8, here nadir of 6.5, transfused one unit with appropriate response today to 8.6 - transfuse as needed - consider IV iron  versus discharge on oral iron  - while vaginal bleeding is clearly the primary driver of this iron  deficiency anemia, needs age appropriate colon cancer screening as well  Will sign off - feel free to contact with follow-up questions. Recommendations relayed to primary team via secure chat.    Reason for consult: Patient is a 48 y.o. G3P3 female who was admitted with heavy vaginal bleeding and anemia.  This is  a recurrent problem. Says most recent bleeding began about 3 days ago, heavy, going through a pad an hour with clots. Mild lightheadedness resolved with blood transfusion. Says insurance issues have precluded outpatient OB/Gyn f/u. Denies rectal bleeding  Past Medical History:  Diagnosis Date   History of radiation therapy 07/18/17-07/24/17   right ear, operative bed 12 Gy in 3 fractions   Keloid    right ear   Paranoid behavior (HCC)    Schizophrenia (HCC)     Past Surgical History:  Procedure Laterality Date   CESAREAN SECTION     KENALOG  INJECTION Bilateral 07/17/2017   Procedure: KENALOG  INJECTION;  Surgeon: Lowery Estefana RAMAN, DO;  Location: [facility];  Service: Plastics;  Laterality: Bilateral;   MASS EXCISION N/A 07/17/2017   Procedure: EXCISION KELOID OF RIGHT EAR WITH INTROPERATIVE KENALOG  INJECTION AND POST OP RADIATION;  Surgeon: Lowery Estefana RAMAN, DO;  Location: [facility];  Service: Plastics;  Laterality: N/A;   TUBAL LIGATION      Family History  Problem Relation Age of Onset   Keloids Mother    Breast cancer Maternal Grandmother    Hypertension Other    Diabetes Other    Cancer Other    Heart attack Other     Social History   Socioeconomic History   Marital status: Divorced    Spouse name: Not on file   Number of children: 4   Years of education: Not on file   Highest education level: Not on file  Occupational History   Occupation: unemployed  Tobacco Use   Smoking status: Never   Smokeless tobacco: Never  Vaping  Use   Vaping status: Never Used  Substance and Sexual Activity   Alcohol  use: Yes    Comment: socially   Drug use: No   Sexual activity: Not Currently    Birth control/protection: Surgical  Other Topics Concern   Not on file  Social History Narrative   Not on file   Social Drivers of Health   Financial Resource Strain: Not on file  Food Insecurity: Food Insecurity Present (08/06/2024)   Hunger  Vital Sign    Worried About Running Out of Food in the Last Year: Often true    Ran Out of Food in the Last Year: Often true  Transportation Needs: Unmet Transportation Needs (08/06/2024)   PRAPARE - Administrator, Civil Service (Medical): Yes    Lack of Transportation (Non-Medical): Yes  Physical Activity: Not on file  Stress: Not on file  Social Connections: Not on file  Intimate Partner Violence: Not At Risk (08/06/2024)   Humiliation, Afraid, Rape, and Kick questionnaire    Fear of Current or Ex-Partner: No    Emotionally Abused: No    Physically Abused: No    Sexually Abused: No     sodium chloride    Intravenous Once   megestrol   80 mg Oral BID    Allergies  Allergen Reactions   Penicillins Hives and Itching    Has patient had a PCN reaction causing immediate rash, facial/tongue/throat swelling, SOB or lightheadedness with hypotension: Unk Has patient had a PCN reaction causing severe rash involving mucus membranes or skin necrosis: Unk Has patient had a PCN reaction that required hospitalization: Unk Has patient had a PCN reaction occurring within the last 10 years: Unk If all of the above answers are NO, then may proceed with Cephalosporin use.     Review of Systems - Negative except as stated in hpi  Exam Vitals:   08/07/24 0725 08/07/24 1143  BP: (!) 150/84 121/80  Pulse: 80 89  Resp:  18  Temp: 99 F (37.2 C) 98 F (36.7 C)  SpO2: 100% 100%   @SPO2 @  Physical Examination:  NAD RRR CTAB Abdomen soft, non-tender Ext warm  Labs:  CBC    Component Value Date/Time   WBC 6.0 08/07/2024 0045   RBC 3.32 (L) 08/07/2024 0045   HGB 8.6 (L) 08/07/2024 0925   HCT 29.1 (L) 08/07/2024 0925   PLT 219 08/07/2024 0045   MCV 71.1 (L) 08/07/2024 0045   MCH 19.6 (L) 08/07/2024 0045   MCHC 27.5 (L) 08/07/2024 0045   RDW 22.5 (H) 08/07/2024 0045   LYMPHSABS 2.4 10/11/2023 0047   MONOABS 0.3 10/11/2023 0047   EOSABS 0.1 10/11/2023 0047   BASOSABS  0.0 10/11/2023 0047    CMP     Component Value Date/Time   NA 138 08/07/2024 0045   K 3.7 08/07/2024 0045   CL 105 08/07/2024 0045   CO2 24 08/07/2024 0045   GLUCOSE 107 (H) 08/07/2024 0045   BUN 15 08/07/2024 0045   CREATININE 0.83 08/07/2024 0045   CALCIUM 8.8 (L) 08/07/2024 0045   PROT 7.8 04/27/2024 0554   ALBUMIN 3.9 04/27/2024 0554   AST 15 04/27/2024 0554   ALT 9 04/27/2024 0554   ALKPHOS 53 04/27/2024 0554   BILITOT 0.8 04/27/2024 0554   GFRNONAA >60 08/07/2024 0045   GFRAA >60 05/24/2020 2036    Lab Results  Component Value Date   TSH 1.332 06/12/2023    No components found for: URINALYSIS  Lab Results  Component Value Date   PREGTESTUR NEGATIVE 04/27/2024    Radiological Studies No results found.  Thank you so much for allowing us  to participate in the care of this patient.  We will sign off but will be available for questions as needed. Please call the attending OB/GYN physician with questions or concerns at [phone] M-F 8a-5p, after hours and on weekends, we can be reached at [phone].

## 2024-08-07 NOTE — TOC Initial Note (Addendum)
 Transition of Care [facility]) - Initial/Assessment Note    Patient Details  Name: Valerie Howard MRN: [id number] Date of Birth: 1976/04/23  [facility] CM/SW Contact:    Heather DELENA Saltness, LCSW Phone Number: 08/07/2024, 10:16 AM  Clinical Narrative:                 Pt admitted to hospital from the street due to recurrent heavy vaginal bleeding. Pt has history of schizoaffective disorder, paranoid behavior, and fibroids causing heavy menorrhagia, resulting in anemia. Pt currently homeless; address listed is [facility] shelter. Pt has no PCP listed in chart. Pt has active [facility] coverage. Pt unable to follow up with gynecology at last admission due to homelessness. Pt frequent readmission. Per chart review, pt declined TOC interventions/resources at previous admissions. Pt may need assistance with transportation at discharge. Pt not currently on medication for schizoaffective disorder. Psychiatry consulted, awaiting recommendations. TOC will continue to follow.    Expected Discharge Plan: Homeless Shelter Barriers to Discharge: Continued Medical Work up   Patient Goals and CMS Choice Patient states their goals for this hospitalization and ongoing recovery are:: To return to [facility]        Expected Discharge Plan and Services In-house Referral: Clinical Social Work Discharge Planning Services: NA Post Acute Care Choice: NA Living arrangements for the past 2 months: Homeless, Homeless Shelter                 DME Arranged: N/A DME Agency: NA       HH Arranged: NA HH Agency: NA        Prior Living Arrangements/Services Living arrangements for the past 2 months: Homeless, Homeless Shelter Lives with:: Self Patient language and need for interpreter reviewed:: Yes Do you feel safe going back to the place where you live?: No   Pt is homeless  Need for Family Participation in Patient Care: No (Comment) Care giver support system in place?: No (comment)   Criminal  Activity/Legal Involvement Pertinent to Current Situation/Hospitalization: No - Comment as needed  Activities of Daily Living   ADL Screening (condition at time of admission) Independently performs ADLs?: Yes (appropriate for developmental age) Is the patient deaf or have difficulty hearing?: No Does the patient have difficulty seeing, even when wearing glasses/contacts?: Yes Does the patient have difficulty concentrating, remembering, or making decisions?: No  Permission Sought/Granted   Permission granted to share information with : No  Emotional Assessment Appearance:: Appears stated age Attitude/Demeanor/Rapport: Unable to Assess Affect (typically observed): Unable to Assess Orientation: : Oriented to Self, Oriented to Place, Oriented to  Time, Oriented to Situation Alcohol  / Substance Use: Alcohol  Use Psych Involvement: Yes (comment)  Admission diagnosis:  Vaginal bleeding [N93.9] Symptomatic anemia [D64.9] Anemia, unspecified type [D64.9] Patient Active Problem List   Diagnosis Date Noted   Physical deconditioning 10/13/2023   Homelessness 09/09/2022   Abnormal uterine bleeding 09/08/2022   Menorrhagia 08/03/2022   Influenza A 08/03/2022   Fibroids 07/28/2022   Acute on chronic blood loss anemia 07/28/2022   Symptomatic anemia 03/18/2020   Paranoia (psychosis) (HCC)    Acute blood loss anemia 02/27/2019   Keloid scar 07/12/2017   Schizoaffective disorder, bipolar type (HCC)    Menorrhagia with irregular cycle 05/12/2014   Personality disorder (HCC) 10/17/2012   Anemia, iron  deficiency 10/13/2012   Tachycardia 10/01/2012   PCP:  Pcp, No Pharmacy:   [facility] [address] [place] [address] Phone: [phone] Fax: [phone]  Social Drivers of Health (SDOH) Social History: SDOH Screenings   Food Insecurity: Food Insecurity Present (08/06/2024)  Housing: High Risk (08/06/2024)  Transportation Needs: Unmet  Transportation Needs (08/06/2024)  Utilities: Patient Unable To Answer (08/06/2024)  Alcohol  Screen: Low Risk  (03/15/2019)  Tobacco Use: Low Risk  (08/06/2024)   SDOH Interventions: Housing resources added to AVS     Readmission Risk Interventions    08/01/2022    4:32 PM  Readmission Risk Prevention Plan  Post Dischage Appt Complete  Medication Screening Complete  Transportation Screening Complete    Signed: Heather Saltness, MSW, LCSW Clinical Social Worker Inpatient Care Management 08/07/2024 10:22 AM

## 2024-08-07 NOTE — Progress Notes (Signed)
 PROGRESS NOTE  Valerie Howard  FMW:[id number] DOB: April 09, 1976 DOA: 08/06/2024 PCP: Pcp, No   Brief Narrative: Patient is a 4 female with history of seizure affective disorder, paranoid behavior, uterine fibroid causing heavy menorrhagia, iron  deficiency anemia who presented with vaginal bleeding.  Patient is homeless, not able to follow-up with gynecology.  On her last admission , she was given IV iron , PRBC and was offered uterine artery embolization but she declined.  Presented back with heavy vaginal bleeding.  Hemoglobin in the range of 7 on presentation.  Being transfused 2 units of PRBC.  GYN, psychiatry consulted.  Assessment & Plan:  Active Problems:   Symptomatic anemia   Acute on chronic symptomatic iron  deficient anemia secondary to menorrhagia: Has history of chronic menorrhagia.  Hemoglobin of 7 on presentation.  Hemoglobin in the range of 6 today.  Given 2 units of blood transfusion.  Fecal occult blood positive but most likely is false positive secondary to vaginal bleeding.  Iron  studies not sent on admission. Monitor CBC.  Menorrhagia/uterine fibroids: Was admitted in February last year for the same.  She was seen by GYN and they recommend endometrial biopsy to rule out cancer or hyperplasia and also discussed uterine artery embolization.  She declined it.  Case was discussed with Dr. Lynwood Solomons, GYN by ED physician.  I  sent message to Dr. Solomons about consult.  He said somebody will see him today  Bacteriuria :UA showed plenty of leukocytes.  Will follow-up urine culture.  Did hide dysuria so antibiotics not continued  Schizophrenia with paranoid features:She is not currently on maintenance medication.  Psychiatry consulted  Homelessness: TOC consulted       DVT prophylaxis:SCDs Start: 08/06/24 1316     Code Status: Full Code  Family Communication: None at the bedside  Patient status: obs  Patient is from : Home  Anticipated discharge to:  Home  Estimated DC date: 1 to 2 days   Consultants: Psychiatry, GYN  Procedures: None  Antimicrobials:  Anti-infectives (From admission, onward)    None       Subjective: Patient seen and examined at bedside today.  Hemodynamically stable.  Comfortable this morning.  Lying on bed.  Had a shower .  She said her menorrhagia is improving since last few days.  Denied any abdomen pain, nausea or vomiting.  Objective: Vitals:   08/07/24 0410 08/07/24 0430 08/07/24 0521 08/07/24 0725  BP: 123/68 118/70 119/68 (!) 150/84  Pulse: 81 75 88 80  Resp: 18 16 18    Temp: 99.1 F (37.3 C) 98.8 F (37.1 C) 98.8 F (37.1 C) 99 F (37.2 C)  TempSrc: Oral Oral Oral Oral  SpO2: 99% 100% 100% 100%  Weight:      Height:        Intake/Output Summary (Last 24 hours) at 08/07/2024 0803 Last data filed at 08/07/2024 0430 Gross per 24 hour  Intake 1020 ml  Output --  Net 1020 ml   Filed Weights   08/06/24 1658  Weight: 95.8 kg    Examination:  General exam: Overall comfortable, not in distress HEENT: PERRL Respiratory system:  no wheezes or crackles  Cardiovascular system: S1 & S2 heard, RRR.  Gastrointestinal system: Abdomen is nondistended, soft and nontender. Central nervous system: Alert and oriented Extremities: No edema, no clubbing ,no cyanosis Skin: No rashes, no ulcers,no icterus     Data Reviewed: I have personally reviewed following labs and imaging studies  CBC: Recent Labs  Lab 08/06/24 0917 08/07/24  0045  WBC 5.0 6.0  HGB 7.1* 6.5*  HCT 26.3* 23.6*  MCV 68.7* 71.1*  PLT 245 219   Basic Metabolic Panel: Recent Labs  Lab 08/06/24 0917 08/07/24 0045  NA 139 138  K 3.8 3.7  CL 105 105  CO2 25 24  GLUCOSE 86 107*  BUN 15 15  CREATININE 0.66 0.83  CALCIUM 9.1 8.8*     Recent Results (from the past 240 hours)  Wet prep, genital     Status: None   Collection Time: 08/06/24 11:48 AM   Specimen: PATH Cytology Ancillary Only  Result Value Ref Range  Status   Yeast Wet Prep HPF POC NONE SEEN NONE SEEN Final   Trich, Wet Prep NONE SEEN NONE SEEN Final   Clue Cells Wet Prep HPF POC NONE SEEN NONE SEEN Final   WBC, Wet Prep HPF POC <10 <10 Final    Comment: Swab received with less than 0.5 mL of saline, saline added to specimen, interpret results with caution.   Sperm NONE SEEN  Final    Comment: Performed at [facility], [address], [place], [place] [address]     Radiology Studies: No results found.  Scheduled Meds:  sodium chloride    Intravenous Once   sodium chloride    Intravenous Once   megestrol   80 mg Oral BID   Continuous Infusions:   LOS: 0 days   Ivonne Mustache, MD [facility] P12/05/2024, 8:03 AM

## 2024-08-08 ENCOUNTER — Other Ambulatory Visit (HOSPITAL_COMMUNITY): Payer: Self-pay

## 2024-08-08 DIAGNOSIS — D62 Acute posthemorrhagic anemia: Secondary | ICD-10-CM | POA: Diagnosis not present

## 2024-08-08 LAB — BPAM RBC
Blood Product Expiration Date: 202512242359
Blood Product Expiration Date: 202601052359
ISSUE DATE / TIME: 202512100414
ISSUE DATE / TIME: 202512091816
Unit Type and Rh: 9500
Unit Type and Rh: 9500

## 2024-08-08 LAB — CBC
HCT: 29.6 % — ABNORMAL LOW (ref 36.0–46.0)
Hemoglobin: 8.6 g/dL — ABNORMAL LOW (ref 12.0–15.0)
MCH: 20.9 pg — ABNORMAL LOW (ref 26.0–34.0)
MCHC: 29.1 g/dL — ABNORMAL LOW (ref 30.0–36.0)
MCV: 72 fL — ABNORMAL LOW (ref 80.0–100.0)
Platelets: 249 K/uL (ref 150–400)
RBC: 4.11 MIL/uL (ref 3.87–5.11)
RDW: 25.1 % — ABNORMAL HIGH (ref 11.5–15.5)
WBC: 8.3 K/uL (ref 4.0–10.5)
nRBC: 0.2 % (ref 0.0–0.2)

## 2024-08-08 LAB — TYPE AND SCREEN
ABO/RH(D): O POS
Antibody Screen: NEGATIVE
Donor AG Type: NEGATIVE
Donor AG Type: NEGATIVE
Unit division: 0
Unit division: 0

## 2024-08-08 LAB — URINE CULTURE

## 2024-08-08 MED ORDER — FERROUS SULFATE 325 (65 FE) MG PO TABS: 325.0000 mg | ORAL_TABLET | Freq: Every day | ORAL | Status: DC

## 2024-08-08 MED ORDER — MEGESTROL ACETATE 40 MG PO TABS
80.0000 mg | ORAL_TABLET | Freq: Two times a day (BID) | ORAL | 0 refills | Status: AC
  Filled 2024-08-08: qty 120, 30d supply, fill #0

## 2024-08-08 MED ORDER — SODIUM CHLORIDE 0.9 % IV SOLN
200.0000 mg | Freq: Once | INTRAVENOUS | Status: AC
  Administered 2024-08-08: 200 mg via INTRAVENOUS
  Filled 2024-08-08: qty 10

## 2024-08-08 MED FILL — Ferrous Sulfate Tab 325 MG (65 MG Elemental Fe): 325.0000 mg | ORAL | 60 days supply | Qty: 60 | Fill #0 | Status: AC

## 2024-08-08 NOTE — Progress Notes (Incomplete)
 Pt requesting clothes for DC.

## 2024-08-08 NOTE — Discharge Summary (Signed)
 Physician Discharge Summary  Valerie Howard FMW:[id number] DOB: September 03, 1975 DOA: 08/06/2024  PCP: Pcp, No  Admit date: 08/06/2024 Discharge date: 08/08/2024  Admitted From: Home Disposition:  Home  Discharge Condition:Stable CODE STATUS:FULL Diet recommendation:  Regular   Brief/Interim Summary: Patient is a 53 female with history of seizure affective disorder, paranoid behavior, uterine fibroid causing heavy menorrhagia, iron  deficiency anemia who presented with vaginal bleeding.  Patient is homeless, not able to follow-up with gynecology.  On her last admission , she was given IV iron , PRBC and was offered uterine artery embolization but she declined.  Presented back with heavy vaginal bleeding.  Hemoglobin in the range of 7 on presentation.  Being transfused 2 units of PRBC.  GYN, psychiatry consulted.  GYN recommended outpatient follow-up and to continue Megace  80 mg twice daily.  Psychiatry did not recommend any inpatient psych admission.  She was given IV iron .  Medically stable for discharge with oral iron  and Megace .  TOC states she will go back to her shelter on discharge.    Following problems were addressed during the hospitalization:   Acute on chronic symptomatic iron  deficient anemia secondary to menorrhagia: Has history of chronic menorrhagia.  Hemoglobin of 7 on presentation.  Hemoglobin dropped to 6 .  Given 2 units of blood transfusion.  Fecal occult blood positive but most likely is false positive secondary to vaginal bleeding.  Iron  studies not sent on admission. Hb now stable in the range of 8.  Given a dose of IV iron .  Continue oral iron  supplementation.   Menorrhagia/uterine fibroids: Was admitted in February last year for the same.  She was seen by GYN and they recommend endometrial biopsy to rule out cancer or hyperplasia and also discussed uterine artery embolization.  She declined it.  GYN saw her here and recommend outpatient follow-up.  Outpatient follow-up  resource provided.  Continue Megace   Bacteriuria :UA showed plenty of leukocytes.  No dysuria or abdominal pain or leukocytosis.  No indication for antibiotic therapy  Schizophrenia with paranoid features:She is not currently on maintenance medication.  Psychiatry consulted.  Psychiatry signed off.  Did not recommend any follow-up or  medications   Homelessness: TOC consulted.  She is planning to go back to her shelter  Discharge Diagnoses:  Principal Problem:   Acute on chronic blood loss anemia Active Problems:   Symptomatic anemia    Discharge Instructions  Discharge Instructions     Diet general   Complete by: As directed    Discharge instructions   Complete by: As directed    1)Please take your medication as instructed 2)Follow up with gynecology as an outpatient.  Name and number of the provider group has been attached.  Please call for appointment   Increase activity slowly   Complete by: As directed       Allergies as of 08/08/2024       Reactions   Penicillins Hives, Itching   Has patient had a PCN reaction causing immediate rash, facial/tongue/throat swelling, SOB or lightheadedness with hypotension: Unk Has patient had a PCN reaction causing severe rash involving mucus membranes or skin necrosis: Unk Has patient had a PCN reaction that required hospitalization: Unk Has patient had a PCN reaction occurring within the last 10 years: Unk If all of the above answers are NO, then may proceed with Cephalosporin use.        Medication List     TAKE these medications    acetaminophen  500 MG tablet Commonly known as:  TYLENOL  Take 1 tablet (500 mg total) by mouth every 6 (six) hours as needed. What changed: reasons to take this   ferrous sulfate  325 (65 FE) MG tablet Take 1 tablet (325 mg total) by mouth daily with breakfast. Start taking on: August 09, 2024   megestrol  40 MG tablet Commonly known as: MEGACE  Take 2 tablets (80 mg total) by mouth 2  (two) times daily.        Follow-up Information     [facility] Follow up.   Why: call to schedule an appointment Contact information: [address] [place], [place] [address] Phone: [phone]               Allergies[1]  Consultations: GYN, psychiatry   Procedures/Studies: No results found.    Subjective: Patient seen and examined at bedside today.  Comfortable.  No new issues.  Medically stable for discharge.  Discharge Exam: Vitals:   08/07/24 2025 08/08/24 0549  BP: 128/77 (!) 113/58  Pulse: 93 74  Resp: 20 18  Temp: 99.1 F (37.3 C) 98.9 F (37.2 C)  SpO2: 99% 100%   Vitals:   08/07/24 0725 08/07/24 1143 08/07/24 2025 08/08/24 0549  BP: (!) 150/84 121/80 128/77 (!) 113/58  Pulse: 80 89 93 74  Resp:  18 20 18   Temp: 99 F (37.2 C) 98 F (36.7 C) 99.1 F (37.3 C) 98.9 F (37.2 C)  TempSrc: Oral     SpO2: 100% 100% 99% 100%  Weight:      Height:        General: Pt is alert, awake, not in acute distress Cardiovascular: RRR, S1/S2 +, no rubs, no gallops Respiratory: CTA bilaterally, no wheezing, no rhonchi Abdominal: Soft, NT, ND, bowel sounds + Extremities: no edema, no cyanosis    The results of significant diagnostics from this hospitalization (including imaging, microbiology, ancillary and laboratory) are listed below for reference.     Microbiology: Recent Results (from the past 240 hours)  Wet prep, genital     Status: None   Collection Time: 08/06/24 11:48 AM   Specimen: PATH Cytology Ancillary Only  Result Value Ref Range Status   Yeast Wet Prep HPF POC NONE SEEN NONE SEEN Final   Trich, Wet Prep NONE SEEN NONE SEEN Final   Clue Cells Wet Prep HPF POC NONE SEEN NONE SEEN Final   WBC, Wet Prep HPF POC <10 <10 Final    Comment: Swab received with less than 0.5 mL of saline, saline added to specimen, interpret results with caution.   Sperm NONE SEEN  Final    Comment: Performed at [facility], [address] [place], [place] [address]  Urine Culture (for pregnant, neutropenic or urologic patients or patients with an indwelling urinary catheter)     Status: None (Preliminary result)   Collection Time: 08/07/24  9:15 AM   Specimen: Urine, Clean Catch  Result Value Ref Range Status   Specimen Description   Final    URINE, CLEAN CATCH Performed at [facility], [address], [place], [place] [address]    Special Requests   Final    NONE Performed at [facility], [address], [place], [place] [address]    Culture   Final    CULTURE REINCUBATED FOR BETTER GROWTH Performed at [facility], [address] [place], [place] [address]    Report Status PENDING  Incomplete     Labs: BNP (last  3 results) No results for input(s): BNP in the last 8760 hours. Basic Metabolic Panel: Recent Labs  Lab 08/06/24 0917 08/07/24 0045  NA 139 138  K 3.8 3.7  CL 105 105  CO2 25 24  GLUCOSE 86 107*  BUN 15 15  CREATININE 0.66 0.83  CALCIUM 9.1 8.8*   Liver Function Tests: No results for input(s): AST, ALT, ALKPHOS, BILITOT, PROT, ALBUMIN in the last 168 hours. No results for input(s): LIPASE, AMYLASE in the last 168 hours. No results for input(s): AMMONIA in the last 168 hours. CBC: Recent Labs  Lab 08/06/24 0917 08/07/24 0045 08/07/24 0925 08/08/24 0606  WBC 5.0 6.0  --  8.3  HGB 7.1* 6.5* 8.6* 8.6*  HCT 26.3* 23.6* 29.1* 29.6*  MCV 68.7* 71.1*  --  72.0*  PLT 245 219  --  249   Cardiac Enzymes: No results for input(s): CKTOTAL, CKMB, CKMBINDEX, TROPONINI in the last 168 hours. BNP: Invalid input(s): POCBNP CBG: No results for input(s): GLUCAP in the last 168 hours. D-Dimer No results for input(s): DDIMER in the last 72 hours. Hgb A1c No results for input(s): HGBA1C in the last 72 hours. Lipid Profile No results for input(s): CHOL, HDL, LDLCALC, TRIG,  CHOLHDL, LDLDIRECT in the last 72 hours. Thyroid  function studies No results for input(s): TSH, T4TOTAL, T3FREE, THYROIDAB in the last 72 hours.  Invalid input(s): FREET3 Anemia work up No results for input(s): VITAMINB12, FOLATE, FERRITIN, TIBC, IRON , RETICCTPCT in the last 72 hours. Urinalysis    Component Value Date/Time   COLORURINE RED (A) 08/06/2024 1127   APPEARANCEUR TURBID (A) 08/06/2024 1127   LABSPEC  08/06/2024 1127    TEST NOT REPORTED DUE TO COLOR INTERFERENCE OF URINE PIGMENT   PHURINE  08/06/2024 1127    TEST NOT REPORTED DUE TO COLOR INTERFERENCE OF URINE PIGMENT   GLUCOSEU (A) 08/06/2024 1127    TEST NOT REPORTED DUE TO COLOR INTERFERENCE OF URINE PIGMENT   HGBUR (A) 08/06/2024 1127    TEST NOT REPORTED DUE TO COLOR INTERFERENCE OF URINE PIGMENT   BILIRUBINUR (A) 08/06/2024 1127    TEST NOT REPORTED DUE TO COLOR INTERFERENCE OF URINE PIGMENT   KETONESUR (A) 08/06/2024 1127    TEST NOT REPORTED DUE TO COLOR INTERFERENCE OF URINE PIGMENT   PROTEINUR (A) 08/06/2024 1127    TEST NOT REPORTED DUE TO COLOR INTERFERENCE OF URINE PIGMENT   UROBILINOGEN 0.2 12/06/2011 0034   NITRITE (A) 08/06/2024 1127    TEST NOT REPORTED DUE TO COLOR INTERFERENCE OF URINE PIGMENT   LEUKOCYTESUR (A) 08/06/2024 1127    TEST NOT REPORTED DUE TO COLOR INTERFERENCE OF URINE PIGMENT   Sepsis Labs Recent Labs  Lab 08/06/24 0917 08/07/24 0045 08/08/24 0606  WBC 5.0 6.0 8.3   Microbiology Recent Results (from the past 240 hours)  Wet prep, genital     Status: None   Collection Time: 08/06/24 11:48 AM   Specimen: PATH Cytology Ancillary Only  Result Value Ref Range Status   Yeast Wet Prep HPF POC NONE SEEN NONE SEEN Final   Trich, Wet Prep NONE SEEN NONE SEEN Final   Clue Cells Wet Prep HPF POC NONE SEEN NONE SEEN Final   WBC, Wet Prep HPF POC <10 <10 Final    Comment: Swab received with less than 0.5 mL of saline, saline added to specimen, interpret  results with caution.   Sperm NONE SEEN  Final    Comment: Performed at [facility], [address] [place], [place] [address]  Urine Culture (for pregnant, neutropenic or urologic patients or patients with an indwelling urinary catheter)     Status: None (Preliminary result)   Collection Time: 08/07/24  9:15 AM   Specimen: Urine, Clean Catch  Result Value Ref Range Status   Specimen Description   Final    URINE, CLEAN CATCH Performed at [facility], [address], [place], [place] [address]    Special Requests   Final    NONE Performed at [facility], [address] [place], [place] [address]    Culture   Final    CULTURE REINCUBATED FOR BETTER GROWTH Performed at [facility], [address], [place], [place] [address]    Report Status PENDING  Incomplete    Please note: You were cared for by a hospitalist during your hospital stay. Once you are discharged, your primary care physician will handle any further medical issues. Please note that NO REFILLS for any discharge medications will be authorized once you are discharged, as it is imperative that you return to your primary care physician (or establish a relationship with a primary care physician if you do not have one) for your post hospital discharge needs so that they can reassess your need for medications and monitor your lab values.    Time coordinating discharge: 40 minutes  SIGNED:   Ivonne Mustache, MD  [facility] 08/08/2024, 11:42 AM Pager [phone]  If 7PM-7AM, please contact night-coverage [email or web address] Password [email or web address]    [1]  Allergies Allergen Reactions   Penicillins Hives and Itching    Has patient had a PCN reaction causing immediate rash, facial/tongue/throat swelling, SOB or lightheadedness with hypotension: Unk Has patient had a PCN reaction causing severe rash involving mucus membranes or skin necrosis: Unk Has patient had  a PCN reaction that required hospitalization: Unk Has patient had a PCN reaction occurring within the last 10 years: Unk If all of the above answers are NO, then may proceed with Cephalosporin use.

## 2024-08-08 NOTE — TOC Transition Note (Signed)
 Transition of Care [facility]) - Discharge Note   Patient Details  Name: Valerie Howard MRN: [id number] Date of Birth: [birth year]  Transition of Care [facility]) CM/SW Contact:  Sonda Manuella Quill, RN Phone Number: 08/08/2024, 11:28 AM   Clinical Narrative:    Spoke w/ pt in room; she declined resources for shelters and transportation; pt did request bus pass; bus pas for pt left w/ [place], [facility]; no IP CM needs.   Final next level of care: Homeless Shelter Barriers to Discharge: No Barriers Identified   Patient Goals and CMS Choice Patient states their goals for this hospitalization and ongoing recovery are:: To return to [facility]          Discharge Placement                       Discharge Plan and Services Additional resources added to the After Visit Summary for   In-house Referral: Clinical Social Work Discharge Planning Services: NA Post Acute Care Choice: NA          DME Arranged: N/A DME Agency: NA       HH Arranged: NA HH Agency: NA        Social Drivers of Health (SDOH) Interventions SDOH Screenings   Food Insecurity: Food Insecurity Present (08/06/2024)  Housing: High Risk (08/06/2024)  Transportation Needs: Unmet Transportation Needs (08/06/2024)  Utilities: Patient Unable To Answer (08/06/2024)  Tobacco Use: Low Risk (08/06/2024)     Readmission Risk Interventions    08/01/2022    4:32 PM  Readmission Risk Prevention Plan  Post Dischage Appt Complete  Medication Screening Complete  Transportation Screening Complete

## 2024-08-08 NOTE — Plan of Care (Signed)
  Problem: Education: Goal: Knowledge of General Education information will improve Description: Including pain rating scale, medication(s)/side effects and non-pharmacologic comfort measures Outcome: Progressing   Problem: Health Behavior/Discharge Planning: Goal: Ability to manage health-related needs will improve Outcome: Progressing   Problem: Clinical Measurements: Goal: Ability to maintain clinical measurements within normal limits will improve Outcome: Progressing Goal: Will remain free from infection Outcome: Progressing Goal: Diagnostic test results will improve Outcome: Progressing Goal: Respiratory complications will improve Outcome: Progressing Goal: Cardiovascular complication will be avoided Outcome: Progressing   Problem: Activity: Goal: Risk for activity intolerance will decrease Outcome: Progressing   Problem: Nutrition: Goal: Adequate nutrition will be maintained Outcome: Progressing   Problem: Coping: Goal: Level of anxiety will decrease Outcome: Progressing   Problem: Elimination: Goal: Will not experience complications related to bowel motility Outcome: Progressing Goal: Will not experience complications related to urinary retention Outcome: Progressing   Problem: Pain Managment: Goal: General experience of comfort will improve and/or be controlled Outcome: Progressing   Problem: Safety: Goal: Ability to remain free from injury will improve Outcome: Progressing   Problem: Skin Integrity: Goal: Risk for impaired skin integrity will decrease Outcome: Progressing   Problem: Education: Goal: Knowledge of [place] [facility] Education information/materials will improve Outcome: Progressing Goal: Emotional status will improve Outcome: Progressing Goal: Mental status will improve Outcome: Progressing Goal: Verbalization of understanding the information provided will improve Outcome: Progressing   Problem: Activity: Goal: Interest or  engagement in activities will improve Outcome: Progressing Goal: Sleeping patterns will improve Outcome: Progressing   Problem: Coping: Goal: Ability to verbalize frustrations and anger appropriately will improve Outcome: Progressing Goal: Ability to demonstrate self-control will improve Outcome: Progressing   Problem: Health Behavior/Discharge Planning: Goal: Identification of resources available to assist in meeting health care needs will improve Outcome: Progressing Goal: Compliance with treatment plan for underlying cause of condition will improve Outcome: Progressing   Problem: Physical Regulation: Goal: Ability to maintain clinical measurements within normal limits will improve Outcome: Progressing   Problem: Safety: Goal: Periods of time without injury will increase Outcome: Progressing

## 2024-09-27 ENCOUNTER — Encounter (HOSPITAL_COMMUNITY): Payer: Self-pay

## 2024-09-27 ENCOUNTER — Emergency Department (HOSPITAL_COMMUNITY)
Admission: EM | Admit: 2024-09-27 | Discharge: 2024-09-27 | Disposition: A | Discharge status: Home / Self Care | Payer: MEDICAID | Attending: Emergency Medicine | Admitting: Emergency Medicine

## 2024-09-27 ENCOUNTER — Other Ambulatory Visit: Payer: Self-pay

## 2024-09-27 DIAGNOSIS — S8492XA Injury of unspecified nerve at lower leg level, left leg, initial encounter: Secondary | ICD-10-CM | POA: Insufficient documentation

## 2024-09-27 DIAGNOSIS — X398XXA Other exposure to forces of nature, initial encounter: Secondary | ICD-10-CM | POA: Insufficient documentation

## 2024-09-27 DIAGNOSIS — Z5900 Homelessness unspecified: Secondary | ICD-10-CM | POA: Diagnosis not present

## 2024-09-27 DIAGNOSIS — R202 Paresthesia of skin: Secondary | ICD-10-CM | POA: Diagnosis present
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
## 2024-09-27 NOTE — ED Notes (Signed)
 Patient homeless and rpeorts she stays to herself and doesn't want to go to [facility] bc of agoraphobia and doesn't want to meet new people

## 2024-09-27 NOTE — ED Notes (Signed)
 Wrapped bilateral feet with socks warm packs and gauze
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
## 2024-09-27 NOTE — ED Notes (Signed)
 Ambulated to bathroom with steady gaitl.

## 2024-09-27 NOTE — ED Notes (Signed)
 Warm socks placed on pt & hot packs given to her for comfort.

## 2024-09-27 NOTE — ED Notes (Signed)
 Toes are now warm
